# Patient Record
Sex: Male | Born: 1945
Health system: Southern US, Community
[De-identification: ages and names within clinical notes are randomized; demographics above are authoritative.]

## PROBLEM LIST (undated history)

## (undated) DIAGNOSIS — N400 Enlarged prostate without lower urinary tract symptoms: Secondary | ICD-10-CM

## (undated) DIAGNOSIS — U071 COVID-19: Secondary | ICD-10-CM

## (undated) DIAGNOSIS — K219 Gastro-esophageal reflux disease without esophagitis: Secondary | ICD-10-CM

## (undated) DIAGNOSIS — F419 Anxiety disorder, unspecified: Secondary | ICD-10-CM

## (undated) DIAGNOSIS — E039 Hypothyroidism, unspecified: Secondary | ICD-10-CM

## (undated) DIAGNOSIS — Z8711 Personal history of peptic ulcer disease: Secondary | ICD-10-CM

## (undated) DIAGNOSIS — G8190 Hemiplegia, unspecified affecting unspecified side: Secondary | ICD-10-CM

## (undated) DIAGNOSIS — G479 Sleep disorder, unspecified: Secondary | ICD-10-CM

## (undated) DIAGNOSIS — A048 Other specified bacterial intestinal infections: Secondary | ICD-10-CM

## (undated) DIAGNOSIS — R200 Anesthesia of skin: Secondary | ICD-10-CM

## (undated) DIAGNOSIS — E7800 Pure hypercholesterolemia, unspecified: Secondary | ICD-10-CM

## (undated) DIAGNOSIS — C8330 Diffuse large B-cell lymphoma, unspecified site: Secondary | ICD-10-CM

## (undated) DIAGNOSIS — C419 Malignant neoplasm of bone and articular cartilage, unspecified: Secondary | ICD-10-CM

## (undated) DIAGNOSIS — C8510 Unspecified B-cell lymphoma, unspecified site: Secondary | ICD-10-CM

## (undated) DIAGNOSIS — J80 Acute respiratory distress syndrome: Secondary | ICD-10-CM

## (undated) DIAGNOSIS — Z9289 Personal history of other medical treatment: Secondary | ICD-10-CM

## (undated) DIAGNOSIS — C8590 Non-Hodgkin lymphoma, unspecified, unspecified site: Secondary | ICD-10-CM

## (undated) DIAGNOSIS — D492 Neoplasm of unspecified behavior of bone, soft tissue, and skin: Principal | ICD-10-CM

## (undated) DIAGNOSIS — G4700 Insomnia, unspecified: Secondary | ICD-10-CM

## (undated) HISTORY — DX: Hemiplegia, unspecified affecting unspecified side: G81.90

## (undated) HISTORY — DX: Neoplasm of unspecified behavior of bone, soft tissue, and skin: D49.2

## (undated) HISTORY — PX: COLONOSCOPY: SHX174

## (undated) HISTORY — DX: Non-Hodgkin lymphoma, unspecified, unspecified site: C85.90

## (undated) HISTORY — DX: Pure hypercholesterolemia, unspecified: E78.00

## (undated) HISTORY — DX: Benign prostatic hyperplasia without lower urinary tract symptoms: N40.0

## (undated) HISTORY — PX: HERNIA REPAIR: SHX51

## (undated) HISTORY — DX: COVID-19: U07.1

## (undated) HISTORY — DX: Hypothyroidism, unspecified: E03.9

## (undated) HISTORY — DX: Sleep disorder, unspecified: G47.9

## (undated) HISTORY — DX: Insomnia, unspecified: G47.00

## (undated) HISTORY — PX: EYE SURGERY: SHX253

---

## 1989-08-23 DIAGNOSIS — Z8711 Personal history of peptic ulcer disease: Secondary | ICD-10-CM

## 1989-08-23 HISTORY — DX: Personal history of peptic ulcer disease: Z87.11

## 1999-02-28 ENCOUNTER — Inpatient Hospital Stay (HOSPITAL_COMMUNITY): Admission: EM | Admit: 1999-02-28 | Discharge: 1999-03-01 | Payer: Self-pay | Admitting: Emergency Medicine

## 1999-03-02 ENCOUNTER — Inpatient Hospital Stay (HOSPITAL_COMMUNITY): Admission: EM | Admit: 1999-03-02 | Discharge: 1999-03-04 | Payer: Self-pay | Admitting: Emergency Medicine

## 1999-05-07 ENCOUNTER — Ambulatory Visit (HOSPITAL_COMMUNITY): Admission: RE | Admit: 1999-05-07 | Discharge: 1999-05-07 | Payer: Self-pay | Admitting: Gastroenterology

## 2005-01-14 ENCOUNTER — Ambulatory Visit (HOSPITAL_COMMUNITY): Admission: RE | Admit: 2005-01-14 | Discharge: 2005-01-14 | Payer: Self-pay | Admitting: Gastroenterology

## 2013-03-03 ENCOUNTER — Telehealth: Payer: Self-pay | Admitting: Medical Oncology

## 2013-03-03 NOTE — Telephone Encounter (Signed)
error 

## 2015-06-19 ENCOUNTER — Other Ambulatory Visit: Payer: Self-pay | Admitting: Family Medicine

## 2015-06-19 DIAGNOSIS — M5412 Radiculopathy, cervical region: Secondary | ICD-10-CM

## 2015-06-21 ENCOUNTER — Ambulatory Visit
Admission: RE | Admit: 2015-06-21 | Discharge: 2015-06-21 | Disposition: A | Discharge status: Home / Self Care | Payer: PPO | Source: Ambulatory Visit | Attending: Family Medicine | Admitting: Family Medicine

## 2015-06-21 ENCOUNTER — Encounter (HOSPITAL_COMMUNITY): Payer: Self-pay | Admitting: Emergency Medicine

## 2015-06-21 ENCOUNTER — Emergency Department (HOSPITAL_COMMUNITY): Payer: PPO

## 2015-06-21 ENCOUNTER — Emergency Department (HOSPITAL_COMMUNITY)
Admission: EM | Admit: 2015-06-21 | Discharge: 2015-06-21 | Disposition: A | Discharge status: Home / Self Care | Payer: PPO | Attending: Emergency Medicine | Admitting: Emergency Medicine

## 2015-06-21 DIAGNOSIS — R221 Localized swelling, mass and lump, neck: Secondary | ICD-10-CM

## 2015-06-21 DIAGNOSIS — M5412 Radiculopathy, cervical region: Secondary | ICD-10-CM

## 2015-06-21 DIAGNOSIS — Z8719 Personal history of other diseases of the digestive system: Secondary | ICD-10-CM | POA: Diagnosis not present

## 2015-06-21 DIAGNOSIS — D4989 Neoplasm of unspecified behavior of other specified sites: Secondary | ICD-10-CM | POA: Diagnosis not present

## 2015-06-21 DIAGNOSIS — C801 Malignant (primary) neoplasm, unspecified: Secondary | ICD-10-CM

## 2015-06-21 DIAGNOSIS — R202 Paresthesia of skin: Secondary | ICD-10-CM | POA: Insufficient documentation

## 2015-06-21 HISTORY — DX: Personal history of peptic ulcer disease: Z87.11

## 2015-06-21 LAB — CBC
HCT: 43.7 % (ref 39.0–52.0)
Hemoglobin: 14.8 g/dL (ref 13.0–17.0)
MCH: 30 pg (ref 26.0–34.0)
MCHC: 33.9 g/dL (ref 30.0–36.0)
MCV: 88.6 fL (ref 78.0–100.0)
PLATELETS: 230 10*3/uL (ref 150–400)
RBC: 4.93 MIL/uL (ref 4.22–5.81)
RDW: 13.6 % (ref 11.5–15.5)
WBC: 9.5 10*3/uL (ref 4.0–10.5)

## 2015-06-21 LAB — URINALYSIS, ROUTINE W REFLEX MICROSCOPIC
Bilirubin Urine: NEGATIVE
Glucose, UA: 100 mg/dL — AB
Hgb urine dipstick: NEGATIVE
Ketones, ur: NEGATIVE mg/dL
LEUKOCYTES UA: NEGATIVE
NITRITE: NEGATIVE
PROTEIN: NEGATIVE mg/dL
Specific Gravity, Urine: 1.017 (ref 1.005–1.030)
Urobilinogen, UA: 0.2 mg/dL (ref 0.0–1.0)
pH: 7.5 (ref 5.0–8.0)

## 2015-06-21 LAB — BASIC METABOLIC PANEL
Anion gap: 9 (ref 5–15)
BUN: 18 mg/dL (ref 6–20)
CALCIUM: 9.2 mg/dL (ref 8.9–10.3)
CO2: 26 mmol/L (ref 22–32)
CREATININE: 0.99 mg/dL (ref 0.61–1.24)
Chloride: 104 mmol/L (ref 101–111)
Glucose, Bld: 156 mg/dL — ABNORMAL HIGH (ref 65–99)
Potassium: 4.1 mmol/L (ref 3.5–5.1)
Sodium: 139 mmol/L (ref 135–145)

## 2015-06-21 MED ORDER — IOHEXOL 300 MG/ML  SOLN
100.0000 mL | Freq: Once | INTRAMUSCULAR | Status: AC | PRN
  Administered 2015-06-21: 100 mL via INTRAVENOUS

## 2015-06-21 MED ORDER — DEXAMETHASONE SODIUM PHOSPHATE 10 MG/ML IJ SOLN
4.0000 mg | Freq: Once | INTRAMUSCULAR | Status: AC
  Administered 2015-06-21: 4 mg via INTRAVENOUS
  Filled 2015-06-21: qty 1

## 2015-06-21 MED ORDER — DEXAMETHASONE 4 MG PO TABS: 4.0000 mg | ORAL_TABLET | Freq: Three times a day (TID) | ORAL | Status: DC

## 2015-06-21 MED ORDER — IOHEXOL 300 MG/ML  SOLN
25.0000 mL | Freq: Once | INTRAMUSCULAR | Status: AC | PRN
  Administered 2015-06-21: 25 mL via ORAL

## 2015-06-21 NOTE — ED Provider Notes (Signed)
CSN: [id number]     Arrival date & time 06/21/15  1640 History   First MD Initiated Contact with Patient 06/21/15 1643     No chief complaint on file.    (Consider location/radiation/quality/duration/timing/severity/associated sxs/prior Treatment) HPI Comments: Dr. Harrington Challenger sent patient to ED after MRI shows C5 tumor. Dr. Harrington Challenger spoke with Dr. Ronnald Ramp with Neurosurgery, who recommended a hard collar and further workup for cancer.  Patient had MRI ordered for bilateral shoulder tingling in cape-like distribution. Patient is a 69 y.o. male presenting with neurologic complaint. The history is provided by the patient.  Neurologic Problem This is a new problem. The current episode started more than 2 days ago. The problem occurs daily. The problem has not changed since onset.Pertinent negatives include no abdominal pain and no shortness of breath. Exacerbated by: with lying down. Nothing relieves the symptoms. He has tried nothing for the symptoms.    Past Medical History  Diagnosis Date  . History of bleeding ulcers    Past Surgical History  Procedure Laterality Date  . Hernia repair     No family history on file. History  Substance Use Topics  . Smoking status: Not on file  . Smokeless tobacco: Not on file  . Alcohol Use: Not on file    Review of Systems  Constitutional: Negative for fever.  Respiratory: Negative for cough and shortness of breath.   Gastrointestinal: Negative for vomiting and abdominal pain.  All other systems reviewed and are negative.     Allergies  Review of patient's allergies indicates not on file.  Home Medications   Prior to Admission medications   Not on File   BP 161/81 mmHg  Pulse 97  Temp(Src) 98.1 F (36.7 C) (Oral)  Resp 20  SpO2 99% Physical Exam  Constitutional: He is oriented to person, place, and time. He appears well-developed and well-nourished. No distress.  HENT:  Head: Normocephalic and atraumatic.  Mouth/Throat: Oropharynx is  clear and moist. No oropharyngeal exudate.  Eyes: EOM are normal. Pupils are equal, round, and reactive to light.  Neck: Normal range of motion. Neck supple.  Cardiovascular: Normal rate and regular rhythm.  Exam reveals no friction rub.   No murmur heard. Pulmonary/Chest: Effort normal and breath sounds normal. No respiratory distress. He has no wheezes. He has no rales.  Abdominal: Soft. He exhibits no distension. There is no tenderness. There is no rebound.  Musculoskeletal: Normal range of motion. He exhibits no edema.  Neurological: He is alert and oriented to person, place, and time. A sensory deficit (mild tingling on bilateral shoulders) is present. No cranial nerve deficit. GCS eye subscore is 4. GCS verbal subscore is 5. GCS motor subscore is 6.  Skin: No rash noted. He is not diaphoretic.  Nursing note and vitals reviewed.   ED Course  Procedures (including critical care time) Labs Review Labs Reviewed  PROTEIN ELECTROPHORESIS, SERUM  CBC  BASIC METABOLIC PANEL  URINALYSIS, ROUTINE W REFLEX MICROSCOPIC (NOT AT [facility])    Imaging Review Mr Cervical Spine Wo Contrast  06/21/2015   CLINICAL DATA:  Cervical radiculitis. Neck pain and left arm pain with bilateral shoulder pain for 3 weeks. Left arm numbness and slight weakness.  EXAM: MRI CERVICAL SPINE WITHOUT CONTRAST  TECHNIQUE: Multiplanar, multisequence MR imaging of the cervical spine was performed. No intravenous contrast was administered.  COMPARISON:  None.  FINDINGS: Images are mildly to moderately degraded by motion artifact. There is mild right convex cervical torticollis. There is no significant  listhesis. Vertebral body heights are preserved.  There is moderate disc space narrowing at C5-6. There is diffusely abnormal marrow throughout the C5 vertebral body, T1 hypointense and STIR hyperintense. Abnormal marrow signal is present in the C5 pedicles with particularly STIR hyperintense signal in the right C5 pars.  There is  abnormal intermediate to low T1 and intermediate to high T2 signal ventral epidural material posterior to the C5 vertebral body which measures up to 7 mm in AP thickness and results in moderate spinal stenosis with moderate impression on the spinal cord. There is mild T2 hyperintensity in the spinal cord at this level compatible with mild cord edema. Abnormal paravertebral soft tissue is also present lateral to the C5 vertebral body and severely narrows the C5-6 neural foramina bilaterally. Tumor also extends into and narrows the right C4-5 neural foramen. Ventral epidural material extends caudally to the inferior C6 endplate level.  Mild STIR hyperintensity along the C6 superior endplate is favored to be related to degenerative disc disease rather than tumor. At C6-7, there is mild disc space height loss, mild disc bulging, and right greater than left uncovertebral spurring which result in moderate to severe right and mild left neural foraminal stenosis without significant spinal stenosis. Severe, asymmetric left-sided facet arthrosis results in moderate left neural foraminal stenosis at C7-T1.  IMPRESSION: 1. Diffuse marrow replacement of the C5 vertebral body highly concerning for neoplasm (metastatic disease, myeloma, or lymphoma). Epidural tumor at this level results in moderate spinal stenosis with moderate impression on the spinal cord and mild cord edema. 2. Abnormal marrow signal extending into the right greater than left C5 posterior elements may reflect additional osseous tumor, with superimposed pathologic fracture not excluded given the edema in the right pars. This could be further evaluated with cervical spine CT.  These results were called by telephone at the time of interpretation on 06/21/2015 at 2:19 pm to Dr. Lona Kettle , who verbally acknowledged these results.   Electronically Signed   By: Logan Bores   On: 06/21/2015 14:28     EKG Interpretation None      MDM   Final diagnoses:   Neck mass    69 year old male here with a C5 tumor. MRI ordered by his PCP for some tingling in his bilateral shoulders and weakness in his bilateral arms. Resulted today with C5 tumor, mild cord edema. Dr. Harrington Challenger chatted with Dr. Ronnald Ramp of neurosurgery who recommended further workup in an [place] collar. Patient here doing well, good grip strength bilaterally but does state some tingling in his shoulders. He stated when he shaves CSN: His one arm up with the other. Denies any other neurologic symptoms. He's well appearing otherwise. We'll order labs and speak with Dr. Ronnald Ramp.  Dr. Ronnald Ramp recommended further workup and Decadron 4 mg q8h.  Patient discussed with Dr. Wynelle Cleveland, she feels patient can be worked up as an outpatient and doesn't require admission. She spoke with Dr. Ronnald Ramp and he agreed. Patient's CTs negative for large cancer. Small pulmonary nodule in the right lower lobe. Patient stable for discharge. I spoke with Dr. Addison Lank with [place] who can help ensure patient gets f/u.   Evelina Bucy, MD 06/22/15 0000

## 2015-06-21 NOTE — ED Notes (Signed)
Per pt states he has been having neck and shoulder pain-went for MRI and was told he had a mass on spinal cord

## 2015-06-21 NOTE — ED Notes (Signed)
Bed: WA17 Expected date:  Expected time:  Means of arrival:  Comments: Pt from PCP- [name]

## 2015-06-21 NOTE — Discharge Instructions (Signed)
Workup today was negative for any acute findings other than the mass in your neck that she already know about. Please take sterilely 3 times a day. Please follow-up with Dr. Harrington Challenger in 1-2 days.

## 2015-06-22 LAB — PROTEIN ELECTROPHORESIS, SERUM
A/G RATIO SPE: 1.1 (ref 0.7–1.7)
Albumin ELP: 3.6 g/dL (ref 2.9–4.4)
Alpha-1-Globulin: 0.2 g/dL (ref 0.0–0.4)
Alpha-2-Globulin: 0.7 g/dL (ref 0.4–1.0)
Beta Globulin: 1.1 g/dL (ref 0.7–1.3)
GAMMA GLOBULIN: 1.2 g/dL (ref 0.4–1.8)
Globulin, Total: 3.3 g/dL (ref 2.2–3.9)
Total Protein ELP: 6.9 g/dL (ref 6.0–8.5)

## 2015-06-28 ENCOUNTER — Telehealth: Payer: Self-pay | Admitting: Oncology

## 2015-06-29 NOTE — Telephone Encounter (Signed)
Error

## 2015-06-30 ENCOUNTER — Other Ambulatory Visit: Payer: Self-pay | Admitting: Internal Medicine

## 2015-06-30 ENCOUNTER — Ambulatory Visit: Payer: PPO | Admitting: Internal Medicine

## 2015-06-30 ENCOUNTER — Other Ambulatory Visit: Payer: PPO

## 2015-06-30 ENCOUNTER — Ambulatory Visit: Payer: PPO

## 2015-06-30 DIAGNOSIS — M899 Disorder of bone, unspecified: Secondary | ICD-10-CM | POA: Insufficient documentation

## 2015-07-03 ENCOUNTER — Telehealth: Payer: Self-pay | Admitting: Hematology

## 2015-07-03 ENCOUNTER — Encounter: Payer: Self-pay | Admitting: Hematology

## 2015-07-03 ENCOUNTER — Ambulatory Visit (HOSPITAL_BASED_OUTPATIENT_CLINIC_OR_DEPARTMENT_OTHER): Payer: PPO

## 2015-07-03 ENCOUNTER — Ambulatory Visit (HOSPITAL_BASED_OUTPATIENT_CLINIC_OR_DEPARTMENT_OTHER): Payer: PPO | Admitting: Hematology

## 2015-07-03 VITALS — BP 145/75 | HR 59 | Temp 98.4°F | Resp 17 | Ht 73.0 in | Wt 222.7 lb

## 2015-07-03 DIAGNOSIS — M542 Cervicalgia: Secondary | ICD-10-CM

## 2015-07-03 DIAGNOSIS — D492 Neoplasm of unspecified behavior of bone, soft tissue, and skin: Secondary | ICD-10-CM | POA: Diagnosis not present

## 2015-07-03 HISTORY — DX: Neoplasm of unspecified behavior of bone, soft tissue, and skin: D49.2

## 2015-07-03 NOTE — Patient Instructions (Signed)
-   Reported emergency room immediately if you have any worsening neck pains or new neurological symptoms. Plan for working up your spine tumor -Get labs today -Set up a PET/CT scan to check for a primary source of your tumor Set up an urgent referrals with radiation oncology, neurosurgery and interventional radiology you'll return to clinic to see me in one week

## 2015-07-03 NOTE — Telephone Encounter (Signed)
per pof to sch pt appt-gave pt copy of avs °

## 2015-07-03 NOTE — Progress Notes (Signed)
Checked in new pt with no financial concerns prior to seeing the dr.  Pt has my card for any billing questions, concerns or if financial assistance is needed.  ° °

## 2015-07-04 ENCOUNTER — Encounter: Payer: Self-pay | Admitting: Radiation Oncology

## 2015-07-04 ENCOUNTER — Other Ambulatory Visit (HOSPITAL_BASED_OUTPATIENT_CLINIC_OR_DEPARTMENT_OTHER): Payer: PPO

## 2015-07-04 ENCOUNTER — Other Ambulatory Visit (HOSPITAL_COMMUNITY)
Admission: RE | Admit: 2015-07-04 | Discharge: 2015-07-04 | Disposition: A | Discharge status: Home / Self Care | Payer: PPO | Source: Ambulatory Visit | Attending: Hematology | Admitting: Hematology

## 2015-07-04 ENCOUNTER — Encounter: Payer: Self-pay | Admitting: Hematology

## 2015-07-04 DIAGNOSIS — D492 Neoplasm of unspecified behavior of bone, soft tissue, and skin: Secondary | ICD-10-CM

## 2015-07-04 DIAGNOSIS — M899 Disorder of bone, unspecified: Secondary | ICD-10-CM | POA: Insufficient documentation

## 2015-07-04 DIAGNOSIS — M542 Cervicalgia: Secondary | ICD-10-CM | POA: Insufficient documentation

## 2015-07-04 DIAGNOSIS — C7951 Secondary malignant neoplasm of bone: Secondary | ICD-10-CM

## 2015-07-04 LAB — CBC WITH DIFFERENTIAL/PLATELET
BASO%: 0.2 % (ref 0.0–2.0)
BASOS ABS: 0 10*3/uL (ref 0.0–0.1)
EOS%: 0 % (ref 0.0–7.0)
Eosinophils Absolute: 0 10*3/uL (ref 0.0–0.5)
HEMATOCRIT: 46.5 % (ref 38.4–49.9)
HEMOGLOBIN: 15.4 g/dL (ref 13.0–17.1)
LYMPH#: 2 10*3/uL (ref 0.9–3.3)
LYMPH%: 13.3 % — AB (ref 14.0–49.0)
MCH: 29.6 pg (ref 27.2–33.4)
MCHC: 33.1 g/dL (ref 32.0–36.0)
MCV: 89.4 fL (ref 79.3–98.0)
MONO#: 0.9 10*3/uL (ref 0.1–0.9)
MONO%: 5.5 % (ref 0.0–14.0)
NEUT#: 12.4 10*3/uL — ABNORMAL HIGH (ref 1.5–6.5)
NEUT%: 81 % — AB (ref 39.0–75.0)
PLATELETS: 201 10*3/uL (ref 140–400)
RBC: 5.2 10*6/uL (ref 4.20–5.82)
RDW: 14.5 % (ref 11.0–14.6)
WBC: 15.3 10*3/uL — AB (ref 4.0–10.3)

## 2015-07-04 LAB — COMPREHENSIVE METABOLIC PANEL (CC13)
ALBUMIN: 3.1 g/dL — AB (ref 3.5–5.0)
ALK PHOS: 62 U/L (ref 40–150)
ALT: 29 U/L (ref 0–55)
ANION GAP: 6 meq/L (ref 3–11)
AST: 18 U/L (ref 5–34)
BUN: 24 mg/dL (ref 7.0–26.0)
CHLORIDE: 107 meq/L (ref 98–109)
CO2: 25 mEq/L (ref 22–29)
Calcium: 9 mg/dL (ref 8.4–10.4)
Creatinine: 0.8 mg/dL (ref 0.7–1.3)
EGFR: >90 mL/min/{1.73_m2} (ref >90)
GLUCOSE: 96 mg/dL (ref 70–140)
Potassium: 4.6 mEq/L (ref 3.5–5.1)
SODIUM: 137 meq/L (ref 136–145)
Total Bilirubin: 0.55 mg/dL (ref 0.20–1.20)
Total Protein: 6.1 g/dL — ABNORMAL LOW (ref 6.4–8.3)

## 2015-07-04 LAB — PROTIME-INR
INR: 0.98 (ref 0.00–1.49)
PROTHROMBIN TIME: 13.2 s (ref 11.6–15.2)

## 2015-07-04 LAB — APTT: APTT: 23 s — AB (ref 24–37)

## 2015-07-04 LAB — C-REACTIVE PROTEIN: CRP: <0.5 mg/dL (ref <0.60)

## 2015-07-04 LAB — LACTATE DEHYDROGENASE (CC13): LDH: 232 U/L (ref 125–245)

## 2015-07-04 LAB — SEDIMENTATION RATE: Sed Rate: 1 mm/hr (ref 0–20)

## 2015-07-04 NOTE — Addendum Note (Signed)
Addended by: Sullivan Lone on: 07/04/2015 10:39 AM   Modules accepted: Miquel Dunn

## 2015-07-04 NOTE — Assessment & Plan Note (Signed)
Patient presented with neck pain for 4-6 weeks with evidence of some proximal muscle weakness and bilateral shoulder pains and left upper extremity discomfort. No gait instability. No bowel or bladder dysfunction. MRI of the C-spine showed CD5 bone marrow replacement concerning for neoplasm with epidural complement and mild cord compression.  The differential diagnosis at this point is broad including metastatic malignancy/lymphoma/multiple myeloma/primary bone tumor. CT chest abdomen pelvis was unrevealing off the site of primary malignancy. Incidentally noted 5 mm right lower lobe lung nodule.  Patient notes improvement in symptoms with using a hard cervical collar and being started on dexamethasone.  Plan -CT scan does not demonstrate an obvious primary lesion or an alternative lesion that might be easier to biopsy for tissue diagnosis. -We will send out labs for a myeloma workup, LDH - sedimentation rate CRP and procalcitonin -PET/CT urgently determine extent of involvement, such for primary tumor. Potentially may reveal an easy to biopsy site pathology. -Initiated official neurosurgery consultation urgently to determined need for surgery if needed for spine stabilization cord compression and might also help with diagnosis of the etiology of the C-spine lesion. -will place interventional radiology consult for diagnostic biopsy of c5 lesion (or other accesible lesion if any noted on PET/CT) if neurosurgery is not planning to perform surgery. -We shall also urgently consult radiation oncology to make them aware of this patient since RT might likely be the treatment if surgery is not indicated. -Continue dexamethasone for now (if patient has Myeloma or lymphoma this unfortunately might muddy the diagnosis) -Patient was strongly counseled to seek immediate attention in the emergency room for worsening neck pain or neurological symptoms.  -. There appears to be significant delay in having the plan  executed as outpatient and given the fact that the patient needs urgent multi-specialty care coordination might admit him expedite the workup and treatment .

## 2015-07-04 NOTE — Progress Notes (Addendum)
Histology and Location of Primary Cancer: C-5 lesion  Sites of Visceral and Bony Metastatic Disease: C-5 mild cord compression  Location(s) of Symptomatic Metastases: pain,tingling numbness on  B/L shoulders and left upper extremity   Past/Anticipated chemotherapy by medical oncology, if any: Dr. Sullivan Lone ,MD 06/21/15,   cervical collar, , will send out labs for myeloma work up , Pet/CT, Neuro consult,  with  f/u 07/10/15 ; Pain on a scale of 0-10 is:  Right shoulder tingling  rx tramadol, prn   If Spine Met(s), symptoms, if any, include:  Bowel/Bladder retention or incontinence (please describe): normal  Numbness or weakness in extremities (please describe) Left arm numbness and weakness:most gone now, minor since taking decadron  Current Decadron regimen, if applicable: 4 mg tid  Ambulatory status? Walker? Wheelchair?:ambulatory wears  Hard collar on for 2 weekst   SAFETY ISSUES: No  Prior radiation? NO  Pacemaker/ICD? NO  Possible current pregnancy?  NO  Is the patient on methotrexate? MO Current Complaints / other details:  Married,smoker  1ppd for 10 years, quit 12/23/81,no alcohol , hx bleeding ulcers, hernia repair ,Insomnia, actinic keratosis anxiety,  Allergies: NKA  .BP 150/78 mmHg  Pulse 62  Temp(Src) 98 F (36.7 C) (Oral)  Resp 20  Ht _0  (1.854 m)  Wt 220 lb (99.791 kg)  BMI 29.03 kg/m2  SpO2 99%  Wt Readings from Last 3 Encounters:  07/05/15 220 lb (99.791 kg)  07/03/15 222 lb 11.2 oz (101.016 kg)  06/21/15 225 lb (102.059 kg)

## 2015-07-04 NOTE — Progress Notes (Signed)
[name]    CONSULT NOTE  Patient Care Team: Lona Kettle, MD as PCP - General (Family Medicine) Brunetta Genera, MD as Consulting Physician (Hematology)  CHIEF COMPLAINTS/PURPOSE OF CONSULTATION:  Neck pain/newly noted C5 spine lesion with epidural extension and mild cord compression.   HISTORY OF PRESENTING ILLNESS:  Ethan Stewart 69 y.o. male of caucasian ancestry is very pleasant and has been in fairly good overall health with minimal chronic comorbidities. Patient presents with 4-6 weeks of worsening neck pain and tingling and numbness of the shoulders and left upper extremity. He first noticed the symptoms about 6 weeks ago at which point he was concerned about whether he had Lyme disease because he had a deer tick bite and will his other friends had take bites and acquired Lyme disease. Patient notes that he went to his primary care physician was tested for Lyme disease and noted negative titers. He had no localized target lesion or rashes at the site of the tick bite. Patient subsequently presented to his primary care physician again on 06/12/2015 with worsening back pain and had an MRI of the C-spine ordered. Which was subsequently done on 06/21/2015 and showed marrow replacement of the C5 vertebral body highly concerning for a neoplasm. Epidural tumor was also noted at this level resulting in moderate spinal stenosis with moderate impression on the spinal cord and mild cord edema. Superimposed pathologic fracture was not excluded.  Patient's primary care physician referred him to the emergency room where he was seen on 06/21/2015. The patient was evaluated by the ED physician consulted Dr. Ronnald Ramp from neurosurgery and the admitting hospitalist. The patient was started on dexamethasone 4 milligrams 3 times a day and given tramadol when necessary for pain management. It was determined that he did not need an inpatient workup or management. Patient reports that he was not given a neurosurgery  followup was recommended to continue using a hard cervical collar which she continues to do..  Patient notes that his neck pain has improved with using the heart cervical collar and taking his dexamethasone. He notes improvement in his bilateral upper discomfort and tingling. He notes that his left upper extremity only has some tingling over his arm. He notes some proximal muscle weakness in bilateral shoulders noted when he tries to reach for things above his shoulders.  He is appreciative of being seen in clinic today to try to figure out what is happening. He is appropriately anxious that delay in his diagnosis and treatment might need to him getting paralyzed which he describes as being 'worse than death" for himself.   Patient denies any fevers/chills/weight loss/night sweats.   no other obvious areas of back pain or bone pain. No overt weight loss. Has been feeling very hungry since being on steroids.   MEDICAL HISTORY:  Past Medical History  Diagnosis Date  . History of bleeding ulcers   . Cervical spine tumor 07/03/2015    SURGICAL HISTORY: Past Surgical History  Procedure Laterality Date  . Hernia repair      SOCIAL HISTORY: History   Social History  . Marital Status: Married    Spouse Name: N/A  . Number of Children: N/A  . Years of Education: N/A   Occupational History  . Not on file.   Social History Main Topics  . Smoking status: Never Smoker   . Smokeless tobacco: Not on file  . Alcohol Use: No  . Drug Use: Not on file  . Sexual Activity: Not on file  Other Topics Concern  . Not on file   Social History Narrative    FAMILY HISTORY: No family history on file.  ALLERGIES:  has No Known Allergies.  MEDICATIONS:  Current Outpatient Prescriptions  Medication Sig Dispense Refill  . dexamethasone (DECADRON) 4 MG tablet Take 1 tablet (4 mg total) by mouth 3 (three) times daily. 90 tablet 0  . diazepam (VALIUM) 5 MG tablet Take 5 mg by mouth every 6  (six) hours as needed for anxiety (anxiety).    . simvastatin (ZOCOR) 20 MG tablet Take 20 mg by mouth daily.    . temazepam (RESTORIL) 15 MG capsule Take 15 mg by mouth at bedtime as needed for sleep (sleep).    . traMADol (ULTRAM) 50 MG tablet Take 50-100 mg by mouth every 6 (six) hours as needed for moderate pain or severe pain (pain).     No current facility-administered medications for this visit.    REVIEW OF SYSTEMS:   Constitutional: Denies fevers, chills or abnormal night sweats Eyes: Denies blurriness of vision, double vision or watery eyes Ears, nose, mouth, throat, and face: Denies mucositis or sore throat Respiratory: Denies cough, dyspnea or wheezes Cardiovascular: Denies palpitation, chest discomfort or lower extremity swelling Gastrointestinal:  Denies nausea, heartburn or change in bowel habits Skin: Denies abnormal skin rashes Lymphatics: Denies new lymphadenopathy or easy bruising Neurological bilateral shoulder tingling and some discomfort, proximal shoulder muscle weakness on arm raising. No evidence of bowel or bladder dysfunction. Behavioral/Psych: Mood is stable, no new changes  All other systems were reviewed with the patient and are negative.  PHYSICAL EXAMINATION: ECOG PERFORMANCE STATUS: 1 - Symptomatic but completely ambulatory  Filed Vitals:   07/03/15 1456  BP: 145/75  Pulse: 59  Temp: 98.4 F (36.9 C)  Resp: 17   Filed Weights   07/03/15 1456  Weight: 222 lb 11.2 oz (101.016 kg)    GENERAL:alert, no distress and comfortable SKIN: skin color, texture, turgor are normal, no rashes or significant lesions EYES: normal, conjunctiva are pink and non-injected, sclera clear OROPHARYNX:no exudate, no erythema and lips, buccal mucosa, and tongue normal  NECK: supple, thyroid normal size, non-tender, without nodularity LYMPH:  no palpable lymphadenopathy in the cervical, axillary or inguinal LUNGS: clear to auscultation and percussion with normal  breathing effort HEART: regular rate & rhythm and no murmurs and no lower extremity edema ABDOMEN:abdomen soft, non-tender and normal bowel sounds Musculoskeletal:no cyanosis of digits and no clubbing  PSYCH: alert & oriented x 3 with fluent speech NEURO: no focal motor/sensory deficits  LABORATORY DATA:  I have reviewed the data as listed Lab Results  Component Value Date   WBC 9.5 06/21/2015   HGB 14.8 06/21/2015   HCT 43.7 06/21/2015   MCV 88.6 06/21/2015   PLT 230 06/21/2015    Recent Labs  06/21/15 1709  NA 139  K 4.1  CL 104  CO2 26  GLUCOSE 156*  BUN 18  CREATININE 0.99  CALCIUM 9.2  GFRNONAA >60  GFRAA >60    RADIOGRAPHIC STUDIES: I have personally reviewed the radiological images as listed and agreed with the findings in the report. Ct Chest W Contrast  06/21/2015   CLINICAL DATA:  Neck and shoulder pain for 3 weeks, known history of vertebral involvement with epidural mass lesion, check for primary  EXAM: CT CHEST, ABDOMEN, AND PELVIS WITH CONTRAST  TECHNIQUE: Multidetector CT imaging of the chest, abdomen and pelvis was performed following the standard protocol during bolus administration of intravenous contrast.  CONTRAST:  64m OMNIPAQUE IOHEXOL 300 MG/ML SOLN, 1087mOMNIPAQUE IOHEXOL 300 MG/ML SOLN  COMPARISON:  None.  FINDINGS: CT CHEST FINDINGS  The lungs are well aerated bilaterally. No focal infiltrate or sizable effusion is noted. A tiny 5 mm nodule is noted within the right lower lobe best seen on image number 35 of series 7. No other nodules are seen.  The thoracic inlet is within normal limits. The thoracic aorta in its branches are unremarkable. The pulmonary artery as visualized is within normal limits. No hilar or mediastinal adenopathy is seen.  CT ABDOMEN AND PELVIS FINDINGS  There is air within the biliary tree likely related to prior sphincterotomy. The liver and gallbladder and spleen are otherwise within normal limits. The adrenal glands and pancreas  show no focal abnormality. Parapelvic cysts are noted bilaterally within the kidneys. No renal calculi or obstructive changes are noted.  The appendix is within normal limits. Diffuse aortoiliac calcifications are noted. A retro aortic left renal vein is seen.  Bladder is well distended. The prostate and seminal vesicles are unremarkable. No acute bony abnormality is noted.  IMPRESSION: 5 mm nodule within the right lower lobe as described.  Pneumobilia consistent with a prior sphincterotomy.  No acute abnormality is identified. No findings to suggest chest etiology for the known lesion at C5 are seen.   Electronically Signed   By: MaInez Catalina.D.   On: 06/21/2015 20:37   Mr Cervical Spine Wo Contrast  06/21/2015   CLINICAL DATA:  Cervical radiculitis. Neck pain and left arm pain with bilateral shoulder pain for 3 weeks. Left arm numbness and slight weakness.  EXAM: MRI CERVICAL SPINE WITHOUT CONTRAST  TECHNIQUE: Multiplanar, multisequence MR imaging of the cervical spine was performed. No intravenous contrast was administered.  COMPARISON:  None.  FINDINGS: Images are mildly to moderately degraded by motion artifact. There is mild right convex cervical torticollis. There is no significant listhesis. Vertebral body heights are preserved.  There is moderate disc space narrowing at C5-6. There is diffusely abnormal marrow throughout the C5 vertebral body, T1 hypointense and STIR hyperintense. Abnormal marrow signal is present in the C5 pedicles with particularly STIR hyperintense signal in the right C5 pars.  There is abnormal intermediate to low T1 and intermediate to high T2 signal ventral epidural material posterior to the C5 vertebral body which measures up to 7 mm in AP thickness and results in moderate spinal stenosis with moderate impression on the spinal cord. There is mild T2 hyperintensity in the spinal cord at this level compatible with mild cord edema. Abnormal paravertebral soft tissue is also present  lateral to the C5 vertebral body and severely narrows the C5-6 neural foramina bilaterally. Tumor also extends into and narrows the right C4-5 neural foramen. Ventral epidural material extends caudally to the inferior C6 endplate level.  Mild STIR hyperintensity along the C6 superior endplate is favored to be related to degenerative disc disease rather than tumor. At C6-7, there is mild disc space height loss, mild disc bulging, and right greater than left uncovertebral spurring which result in moderate to severe right and mild left neural foraminal stenosis without significant spinal stenosis. Severe, asymmetric left-sided facet arthrosis results in moderate left neural foraminal stenosis at C7-T1.  IMPRESSION: 1. Diffuse marrow replacement of the C5 vertebral body highly concerning for neoplasm (metastatic disease, myeloma, or lymphoma). Epidural tumor at this level results in moderate spinal stenosis with moderate impression on the spinal cord and mild cord edema. 2. Abnormal marrow  signal extending into the right greater than left C5 posterior elements may reflect additional osseous tumor, with superimposed pathologic fracture not excluded given the edema in the right pars. This could be further evaluated with cervical spine CT.  These results were called by telephone at the time of interpretation on 06/21/2015 at 2:19 pm to Dr. Lona Kettle , who verbally acknowledged these results.   Electronically Signed   By: Logan Bores   On: 06/21/2015 14:28   Ct Abdomen Pelvis W Contrast  06/21/2015   CLINICAL DATA:  Neck and shoulder pain for 3 weeks, known history of vertebral involvement with epidural mass lesion, check for primary  EXAM: CT CHEST, ABDOMEN, AND PELVIS WITH CONTRAST  TECHNIQUE: Multidetector CT imaging of the chest, abdomen and pelvis was performed following the standard protocol during bolus administration of intravenous contrast.  CONTRAST:  81m OMNIPAQUE IOHEXOL 300 MG/ML SOLN, 1035mOMNIPAQUE  IOHEXOL 300 MG/ML SOLN  COMPARISON:  None.  FINDINGS: CT CHEST FINDINGS  The lungs are well aerated bilaterally. No focal infiltrate or sizable effusion is noted. A tiny 5 mm nodule is noted within the right lower lobe best seen on image number 35 of series 7. No other nodules are seen.  The thoracic inlet is within normal limits. The thoracic aorta in its branches are unremarkable. The pulmonary artery as visualized is within normal limits. No hilar or mediastinal adenopathy is seen.  CT ABDOMEN AND PELVIS FINDINGS  There is air within the biliary tree likely related to prior sphincterotomy. The liver and gallbladder and spleen are otherwise within normal limits. The adrenal glands and pancreas show no focal abnormality. Parapelvic cysts are noted bilaterally within the kidneys. No renal calculi or obstructive changes are noted.  The appendix is within normal limits. Diffuse aortoiliac calcifications are noted. A retro aortic left renal vein is seen.  Bladder is well distended. The prostate and seminal vesicles are unremarkable. No acute bony abnormality is noted.  IMPRESSION: 5 mm nodule within the right lower lobe as described.  Pneumobilia consistent with a prior sphincterotomy.  No acute abnormality is identified. No findings to suggest chest etiology for the known lesion at C5 are seen.   Electronically Signed   By: MaInez Catalina.D.   On: 06/21/2015 20:37    ASSESSMENT & PLAN:   .Cervical spine tumor Patient presented with neck pain for 4-6 weeks with evidence of some proximal muscle weakness and bilateral shoulder pains and left upper extremity discomfort. No gait instability. No bowel or bladder dysfunction. MRI of the C-spine showed CD5 bone marrow replacement concerning for neoplasm with epidural complement and mild cord compression.  The differential diagnosis at this point is broad including metastatic malignancy/lymphoma/multiple myeloma/primary bone tumor. CT chest abdomen pelvis was  unrevealing off the site of primary malignancy. Incidentally noted 5 mm right lower lobe lung nodule.  Patient notes improvement in symptoms with using a hard cervical collar and being started on dexamethasone.  Plan -CT scan does not demonstrate an obvious primary lesion or an alternative lesion that might be easier to biopsy for tissue diagnosis. -We will send out labs for a myeloma workup, LDH - sedimentation rate CRP and procalcitonin -PET/CT urgently determine extent of involvement, such for primary tumor. Potentially may reveal an easy to biopsy site pathology. -Initiated official neurosurgery consultation urgently to determined need for surgery if needed for spine stabilization cord compression and might also help with diagnosis of the etiology of the C-spine lesion. -will place interventional radiology  consult for diagnostic biopsy of c5 lesion (or other accesible lesion if any noted on PET/CT) if neurosurgery is not planning to perform surgery. -We shall also urgently consult radiation oncology to make them aware of this patient since RT might likely be the treatment if surgery is not indicated. -Continue dexamethasone for now (if patient has Myeloma or lymphoma this unfortunately might muddy the diagnosis) -Patient was strongly counseled to seek immediate attention in the emergency room for worsening neck pain or neurological symptoms.  -. There appears to be significant delay in having the plan executed as outpatient and given the fact that the patient needs urgent multi-specialty care coordination might admit him expedite the workup and treatment .    Neck pain, acute This is related to his C5 lesion as well as degenerative disc disease and facet arthropathy. Plan -Patient is currently using tramadol when necessary with adequate relief. -Continue using cervical heart collar continuously as recommended by neurosurgery.    All questions were answered. The patient knows to call the  clinic with any problems, questions or concerns. I spent 60 minutes counseling the patient face to face. The total time spent in the appointment was 80 minutes and more than 50% was on counseling.     Brunetta Genera, MD [phone]

## 2015-07-04 NOTE — Assessment & Plan Note (Signed)
This is related to his C5 lesion as well as degenerative disc disease and facet arthropathy. Plan -Patient is currently using tramadol when necessary with adequate relief. -Continue using cervical heart collar continuously as recommended by neurosurgery.

## 2015-07-05 ENCOUNTER — Encounter: Payer: Self-pay | Admitting: Radiation Oncology

## 2015-07-05 ENCOUNTER — Encounter: Payer: Self-pay | Admitting: Hematology

## 2015-07-05 ENCOUNTER — Ambulatory Visit
Admission: RE | Admit: 2015-07-05 | Discharge: 2015-07-05 | Disposition: A | Discharge status: Home / Self Care | Payer: PPO | Source: Ambulatory Visit | Attending: Radiation Oncology | Admitting: Radiation Oncology

## 2015-07-05 VITALS — BP 150/78 | HR 62 | Temp 98.0°F | Resp 20 | Ht 73.0 in | Wt 220.0 lb

## 2015-07-05 DIAGNOSIS — Z510 Encounter for antineoplastic radiation therapy: Secondary | ICD-10-CM | POA: Insufficient documentation

## 2015-07-05 DIAGNOSIS — C412 Malignant neoplasm of vertebral column: Secondary | ICD-10-CM | POA: Diagnosis present

## 2015-07-05 DIAGNOSIS — M899 Disorder of bone, unspecified: Secondary | ICD-10-CM

## 2015-07-05 DIAGNOSIS — D492 Neoplasm of unspecified behavior of bone, soft tissue, and skin: Secondary | ICD-10-CM

## 2015-07-05 DIAGNOSIS — F419 Anxiety disorder, unspecified: Secondary | ICD-10-CM | POA: Insufficient documentation

## 2015-07-05 DIAGNOSIS — C7951 Secondary malignant neoplasm of bone: Secondary | ICD-10-CM

## 2015-07-05 HISTORY — DX: Anxiety disorder, unspecified: F41.9

## 2015-07-05 HISTORY — DX: Malignant neoplasm of bone and articular cartilage, unspecified: C41.9

## 2015-07-05 LAB — PSA: PSA: 1.1 ng/mL (ref <=4.00)

## 2015-07-05 LAB — IGG, IGA, IGM
IgA: 213 mg/dL (ref 68–379)
IgG (Immunoglobin G), Serum: 878 mg/dL (ref 650–1600)
IgM, Serum: 110 mg/dL (ref 41–251)

## 2015-07-05 LAB — CALCIUM, IONIZED: Calcium, Ion: 1.28 mmol/L (ref 1.12–1.32)

## 2015-07-05 LAB — KAPPA/LAMBDA LIGHT CHAINS
Kappa free light chain: 1.06 mg/dL (ref 0.33–1.94)
Kappa:Lambda Ratio: 1.23 (ref 0.26–1.65)
Lambda Free Lght Chn: 0.86 mg/dL (ref 0.57–2.63)

## 2015-07-05 NOTE — Progress Notes (Signed)
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 A diagnosis of Bone lesion was also pertinent to this visit.   HISTORY OF PRESENT ILLNESS::Ethan Stewart is a 69 y.o. male who is seen for an initial consultation visit regarding the patient's diagnosis of cervical spine tumor.  The patient presented with neck pain and some tingling in the upper shoulder. The patient also describes some history of left arm numbness and weakness which has improved since she began Decadron. He had a recent noted med onc.   Histology and Location of Primary Cancer: C-5 lesion  Sites of Visceral and Bony Metastatic Disease: C-5 mild cord compression  Location(s) of Symptomatic Metastases: pain,tingling numbness on B/L shoulders and left upper extremity  Past/Anticipated chemotherapy by medical oncology, if any: Dr. Sullivan Lone ,MD 06/21/15, cervical collar, , will send out labs for myeloma work up , Pet/CT, Neuro consult, with f/u 07/10/15  ;  Pain on a scale of 0-10 is: Right shoulder tingling rx tramadol, prn  If Spine Met(s), symptoms, if any, include:  Bowel/Bladder retention or incontinence (please describe): normal  Numbness or weakness in extremities (please describe) Left arm numbness and weakness:most gone now, minor since taking decadron  Current Decadron regimen, if applicable: 4 mg tid Ambulatory status? Walker? Wheelchair?:ambulatory wears Hard collar on for 2 weekst  SAFETY ISSUES: No  Prior radiation? NO  Pacemaker/ICD? NO  Possible current pregnancy? NO  Is the patient on methotrexate? MO Current Complaints / other details: Married,smoker 1ppd for 10 years, quit 12/23/81,no alcohol , hx bleeding ulcers, hernia repair ,Insomnia,  actinic keratosis anxiety,  Allergies: NKA  He originally consulted with his primary care physician due to pain in his neck and shoulders, as well as weakness when raising his arms above his head. He also experienced a lot of numbness in his left arm. Initially, Ethan Stewart believed he had lyme disease due to finding a tick on himself a week before symptoms began. His primary care doctor believed he may have had a bone spur or a bulging disc, ordering an MRI to better diagnose his problem. This MRI revealed his tumor. He was given a neck brace to wear and began on steroids.   Patient presents wearing a surgical neck collar and is here with his wife. He has some tingling in his neck though not nearly what it had been before. The previous numbness in his left arm was alleviated with the steroid regimen. He has still experienced some cramping in his hand but it is bearable ever since the steroids.  He states he is right handed. He is concerned about what will happen once the tumor is removed and how his spine will respond after surgery.  He began seeing Dr. Irene Limbo this past Monday, 7/11. He is scheduled to see Dr. Irene Limbo again on Monday, 7/18.  They have not talked with a neurosurgeon yet. He was told in the ER that the neurosurgeon was thinking of radiation treatment.    PREVIOUS RADIATION THERAPY: No   PAST MEDICAL HISTORY:  has a past medical history of History of bleeding ulcers; Cervical spine tumor (07/03/2015); Bone cancer; and Anxiety.     PAST SURGICAL HISTORY: Past Surgical History  Procedure Laterality Date  .  Hernia repair       FAMILY HISTORY: family history includes Hypertension in his mother.   SOCIAL HISTORY:  reports that he quit smoking about 33 years ago. His smoking use included Cigarettes. He has a 10 pack-year smoking history. He does not have any smokeless tobacco history on file. He reports that he drinks about 1.2 oz of alcohol per week. He reports that he does not use  illicit drugs.   ALLERGIES: Review of patient's allergies indicates no known allergies.   MEDICATIONS:  Current Outpatient Prescriptions  Medication Sig Dispense Refill  . dexamethasone (DECADRON) 4 MG tablet Take 1 tablet (4 mg total) by mouth 3 (three) times daily. 90 tablet 0  . diazepam (VALIUM) 5 MG tablet Take 5 mg by mouth every 6 (six) hours as needed for anxiety (anxiety).    . simvastatin (ZOCOR) 20 MG tablet Take 20 mg by mouth daily.    . temazepam (RESTORIL) 15 MG capsule Take 15 mg by mouth at bedtime as needed for sleep (sleep).    . traMADol (ULTRAM) 50 MG tablet Take 50-100 mg by mouth every 6 (six) hours as needed for moderate pain or severe pain (pain).     No current facility-administered medications for this encounter.     REVIEW OF SYSTEMS:  A 15 point review of systems is documented in the electronic medical record. This was obtained by the nursing staff. However, I reviewed this with the patient to discuss relevant findings and make appropriate changes.  Pertinent items are noted in HPI.    PHYSICAL EXAM:  height is '6\' 1"'  (1.854 m) and weight is 220 lb (99.791 kg). His oral temperature is 98 F (36.7 C). His blood pressure is 150/78 and his pulse is 62. His respiration is 20 and oxygen saturation is 99%.   General: Well-developed, in no acute distress. HEENT: Normocephalic, atraumatic; oral cavity clear Neck: Supple without any lymphadenopathy. Surgical collar in place. Cardiovascular: Regular rate and rhythm Respiratory: Clear to auscultation bilaterally GI: Soft, nontender, normal bowel sounds Extremities: No edema present Neuro: No focal deficits     ECOG = 1  0 - Asymptomatic (Fully active, able to carry on all predisease activities without restriction)  1 - Symptomatic but completely ambulatory (Restricted in physically strenuous activity but ambulatory and able to carry out work of a light or sedentary nature. For example, light housework, office  work)  2 - Symptomatic, <50% in bed during the day (Ambulatory and capable of all self care but unable to carry out any work activities. Up and about more than 50% of waking hours)  3 - Symptomatic, >50% in bed, but not bedbound (Capable of only limited self-care, confined to bed or chair 50% or more of waking hours)  4 - Bedbound (Completely disabled. Cannot carry on any self-care. Totally confined to bed or chair)  5 - Death   Eustace Pen MM, Creech RH, Tormey DC, et al. [phone]). "Toxicity and response criteria of the [facility]". [place] Oncol. 5 (6): 649-55     LABORATORY DATA:  Lab Results  Component Value Date   WBC 15.3* 07/04/2015   HGB 15.4 07/04/2015   HCT 46.5 07/04/2015   MCV 89.4 07/04/2015   PLT 201 07/04/2015   Lab Results  Component Value Date   NA 137 07/04/2015   K 4.6 07/04/2015   CL 104 06/21/2015   CO2 25 07/04/2015   Lab Results  Component Value Date   ALT 29  07/04/2015   AST 18 07/04/2015   ALKPHOS 62 07/04/2015   BILITOT 0.55 07/04/2015      RADIOGRAPHY: Ct Chest W Contrast  06/21/2015   CLINICAL DATA:  Neck and shoulder pain for 3 weeks, known history of vertebral involvement with epidural mass lesion, check for primary  EXAM: CT CHEST, ABDOMEN, AND PELVIS WITH CONTRAST  TECHNIQUE: Multidetector CT imaging of the chest, abdomen and pelvis was performed following the standard protocol during bolus administration of intravenous contrast.  CONTRAST:  59m OMNIPAQUE IOHEXOL 300 MG/ML SOLN, 1074mOMNIPAQUE IOHEXOL 300 MG/ML SOLN  COMPARISON:  None.  FINDINGS: CT CHEST FINDINGS  The lungs are well aerated bilaterally. No focal infiltrate or sizable effusion is noted. A tiny 5 mm nodule is noted within the right lower lobe best seen on image number 35 of series 7. No other nodules are seen.  The thoracic inlet is within normal limits. The thoracic aorta in its branches are unremarkable. The pulmonary artery as visualized is within normal  limits. No hilar or mediastinal adenopathy is seen.  CT ABDOMEN AND PELVIS FINDINGS  There is air within the biliary tree likely related to prior sphincterotomy. The liver and gallbladder and spleen are otherwise within normal limits. The adrenal glands and pancreas show no focal abnormality. Parapelvic cysts are noted bilaterally within the kidneys. No renal calculi or obstructive changes are noted.  The appendix is within normal limits. Diffuse aortoiliac calcifications are noted. A retro aortic left renal vein is seen.  Bladder is well distended. The prostate and seminal vesicles are unremarkable. No acute bony abnormality is noted.  IMPRESSION: 5 mm nodule within the right lower lobe as described.  Pneumobilia consistent with a prior sphincterotomy.  No acute abnormality is identified. No findings to suggest chest etiology for the known lesion at C5 are seen.   Electronically Signed   By: MaInez Catalina.D.   On: 06/21/2015 20:37   Mr Cervical Spine Wo Contrast  06/21/2015   CLINICAL DATA:  Cervical radiculitis. Neck pain and left arm pain with bilateral shoulder pain for 3 weeks. Left arm numbness and slight weakness.  EXAM: MRI CERVICAL SPINE WITHOUT CONTRAST  TECHNIQUE: Multiplanar, multisequence MR imaging of the cervical spine was performed. No intravenous contrast was administered.  COMPARISON:  None.  FINDINGS: Images are mildly to moderately degraded by motion artifact. There is mild right convex cervical torticollis. There is no significant listhesis. Vertebral body heights are preserved.  There is moderate disc space narrowing at C5-6. There is diffusely abnormal marrow throughout the C5 vertebral body, T1 hypointense and STIR hyperintense. Abnormal marrow signal is present in the C5 pedicles with particularly STIR hyperintense signal in the right C5 pars.  There is abnormal intermediate to low T1 and intermediate to high T2 signal ventral epidural material posterior to the C5 vertebral body which  measures up to 7 mm in AP thickness and results in moderate spinal stenosis with moderate impression on the spinal cord. There is mild T2 hyperintensity in the spinal cord at this level compatible with mild cord edema. Abnormal paravertebral soft tissue is also present lateral to the C5 vertebral body and severely narrows the C5-6 neural foramina bilaterally. Tumor also extends into and narrows the right C4-5 neural foramen. Ventral epidural material extends caudally to the inferior C6 endplate level.  Mild STIR hyperintensity along the C6 superior endplate is favored to be related to degenerative disc disease rather than tumor. At C6-7, there is mild disc space height loss,  mild disc bulging, and right greater than left uncovertebral spurring which result in moderate to severe right and mild left neural foraminal stenosis without significant spinal stenosis. Severe, asymmetric left-sided facet arthrosis results in moderate left neural foraminal stenosis at C7-T1.  IMPRESSION: 1. Diffuse marrow replacement of the C5 vertebral body highly concerning for neoplasm (metastatic disease, myeloma, or lymphoma). Epidural tumor at this level results in moderate spinal stenosis with moderate impression on the spinal cord and mild cord edema. 2. Abnormal marrow signal extending into the right greater than left C5 posterior elements may reflect additional osseous tumor, with superimposed pathologic fracture not excluded given the edema in the right pars. This could be further evaluated with cervical spine CT.  These results were called by telephone at the time of interpretation on 06/21/2015 at 2:19 pm to Dr. Lona Kettle , who verbally acknowledged these results.   Electronically Signed   By: Logan Bores   On: 06/21/2015 14:28   Ct Abdomen Pelvis W Contrast  06/21/2015   CLINICAL DATA:  Neck and shoulder pain for 3 weeks, known history of vertebral involvement with epidural mass lesion, check for primary  EXAM: CT CHEST,  ABDOMEN, AND PELVIS WITH CONTRAST  TECHNIQUE: Multidetector CT imaging of the chest, abdomen and pelvis was performed following the standard protocol during bolus administration of intravenous contrast.  CONTRAST:  7m OMNIPAQUE IOHEXOL 300 MG/ML SOLN, 1010mOMNIPAQUE IOHEXOL 300 MG/ML SOLN  COMPARISON:  None.  FINDINGS: CT CHEST FINDINGS  The lungs are well aerated bilaterally. No focal infiltrate or sizable effusion is noted. A tiny 5 mm nodule is noted within the right lower lobe best seen on image number 35 of series 7. No other nodules are seen.  The thoracic inlet is within normal limits. The thoracic aorta in its branches are unremarkable. The pulmonary artery as visualized is within normal limits. No hilar or mediastinal adenopathy is seen.  CT ABDOMEN AND PELVIS FINDINGS  There is air within the biliary tree likely related to prior sphincterotomy. The liver and gallbladder and spleen are otherwise within normal limits. The adrenal glands and pancreas show no focal abnormality. Parapelvic cysts are noted bilaterally within the kidneys. No renal calculi or obstructive changes are noted.  The appendix is within normal limits. Diffuse aortoiliac calcifications are noted. A retro aortic left renal vein is seen.  Bladder is well distended. The prostate and seminal vesicles are unremarkable. No acute bony abnormality is noted.  IMPRESSION: 5 mm nodule within the right lower lobe as described.  Pneumobilia consistent with a prior sphincterotomy.  No acute abnormality is identified. No findings to suggest chest etiology for the known lesion at C5 are seen.   Electronically Signed   By: MaInez Catalina.D.   On: 06/21/2015 20:37       IMPRESSION:  The patient has a lesion in the C5 vertebral body. Unknown etiology at this time. PET scan has been ordered to look for a primary tumor. Additional workup pending in terms of lab work.  I have had a chance to speak with Dr. JoRonnald Ramprom neurosurgery. He is aware of the  patient and is also following his case. The patient at this time may be a good surgical candidate, depending on his final results. If the patient had a extremely radiosensitive tumor such as myeloma or lymphoma, then radiation alone may be reasonable. Otherwise, surgery followed by postoperative radiation treatment depending on the final result may also be a reasonable consideration. I discussed with  the patient today that we do not have enough information to formulate a final plan. All of his questions otherwise were answered today.   PLAN: We will decide on radiation treatment based on biopsy and PET results, as well as the opinion of a neurosurgeon. If a good target for biopsy is apparent on PET scan, then this can be pursued.  Advised for him to contact us if his symptoms worsen, if this occurs on the weekend go to the ER.     I spent 60 minutes face to face with the patient and more than 50% of that time was spent in counseling and/or coordination of care.   This document serves as a record of services personally performed by Kyung Rudd, MD. It was created on his behalf by Arlyce Harman, a trained medical scribe. The creation of this record is based on the scribe's personal observations and the provider's statements to them. This document has been checked and approved by the attending provider.  ________________________________   Jodelle Gross, MD, PhD   **Disclaimer: This note was dictated with voice recognition software. Similar sounding words can inadvertently be transcribed and this note may contain transcription errors which may not have been corrected upon publication of note.**

## 2015-07-05 NOTE — Progress Notes (Signed)
Please see the Nurse Progress Note in the MD Initial Consult Encounter for this patient. 

## 2015-07-06 ENCOUNTER — Telehealth: Payer: Self-pay | Admitting: Hematology

## 2015-07-06 ENCOUNTER — Encounter: Payer: Self-pay | Admitting: *Deleted

## 2015-07-06 ENCOUNTER — Other Ambulatory Visit: Payer: Self-pay | Admitting: *Deleted

## 2015-07-06 ENCOUNTER — Telehealth: Payer: Self-pay | Admitting: *Deleted

## 2015-07-06 NOTE — Telephone Encounter (Signed)
Due to training moved 7/18 f/u from 8:30 am to 1 pm. Spoke with patient he is aware.

## 2015-07-06 NOTE — Telephone Encounter (Signed)
Informed patient that PET appointment has been rescheduled from 7/20 to 7/18 @7am .  NPO after midnight.  Pt verbalized understanding.

## 2015-07-06 NOTE — Progress Notes (Signed)
Central Scheduling called to follow up on scheduling of patient PET/CT scan.  Schedulers made aware that scan is urgent.  Per scheduler, appointment will be made immediately.

## 2015-07-06 NOTE — Telephone Encounter (Signed)
Message left for new patient coordinator at [facility] to follow up with status of appointment scheduling with Dr Ellene Route.

## 2015-07-06 NOTE — Addendum Note (Signed)
Addended by: Sullivan Lone on: 07/06/2015 02:01 PM   Modules accepted: Orders

## 2015-07-07 ENCOUNTER — Other Ambulatory Visit: Payer: Self-pay | Admitting: *Deleted

## 2015-07-07 ENCOUNTER — Telehealth: Payer: Self-pay | Admitting: Hematology

## 2015-07-07 ENCOUNTER — Encounter: Payer: Self-pay | Admitting: Radiation Oncology

## 2015-07-07 DIAGNOSIS — C8330 Diffuse large B-cell lymphoma, unspecified site: Secondary | ICD-10-CM | POA: Insufficient documentation

## 2015-07-07 DIAGNOSIS — C7951 Secondary malignant neoplasm of bone: Secondary | ICD-10-CM

## 2015-07-07 NOTE — Telephone Encounter (Signed)
Records faxed to [facility]

## 2015-07-10 ENCOUNTER — Ambulatory Visit (HOSPITAL_COMMUNITY)
Admission: RE | Admit: 2015-07-10 | Discharge: 2015-07-10 | Disposition: A | Discharge status: Home / Self Care | Payer: PPO | Source: Ambulatory Visit | Attending: Hematology | Admitting: Hematology

## 2015-07-10 ENCOUNTER — Encounter: Payer: Self-pay | Admitting: Hematology

## 2015-07-10 ENCOUNTER — Ambulatory Visit (HOSPITAL_BASED_OUTPATIENT_CLINIC_OR_DEPARTMENT_OTHER): Payer: PPO | Admitting: Hematology

## 2015-07-10 ENCOUNTER — Other Ambulatory Visit: Payer: PPO

## 2015-07-10 VITALS — BP 155/75 | HR 71 | Temp 98.2°F | Resp 20 | Ht 73.0 in | Wt 220.0 lb

## 2015-07-10 DIAGNOSIS — C7951 Secondary malignant neoplasm of bone: Secondary | ICD-10-CM | POA: Diagnosis not present

## 2015-07-10 DIAGNOSIS — D492 Neoplasm of unspecified behavior of bone, soft tissue, and skin: Secondary | ICD-10-CM | POA: Diagnosis not present

## 2015-07-10 DIAGNOSIS — C799 Secondary malignant neoplasm of unspecified site: Secondary | ICD-10-CM

## 2015-07-10 DIAGNOSIS — C801 Malignant (primary) neoplasm, unspecified: Secondary | ICD-10-CM

## 2015-07-10 LAB — GLUCOSE, CAPILLARY: Glucose-Capillary: 89 mg/dL (ref 65–99)

## 2015-07-10 MED ORDER — FLUDEOXYGLUCOSE F - 18 (FDG) INJECTION
10.7600 | Freq: Once | INTRAVENOUS | Status: AC | PRN
  Administered 2015-07-10: 10.76 via INTRAVENOUS

## 2015-07-10 NOTE — Progress Notes (Signed)
Ethan Stewart  HEMATOLOGY ONCOLOGY PROGRESS NOTE  Patient Care Team: Ethan Kettle, MD as PCP - General (Family Medicine) Ethan Genera, MD as Consulting Physician (Hematology)  SUMMARY OF ONCOLOGIC HISTORY:   Osseous metastasis   06/21/2015 Imaging CT Chest/abd/pelvis: IMPRESSION: 5 mm nodule within the right lower lobe as described.  Pneumobilia consistent with a prior sphincterotomy.  No acute abnormality is identified. No findings to suggest chest etiology for the known lesion at C5 are seen.   06/21/2015 Imaging MRI c spine1. Diffuse marrow replacement of the C5 vertebral body highly concerning for neoplasm (metastatic disease, myeloma, or lymphoma). Epidural tumor at this level results in moderate spinal stenosis with moderate impression on the spinal cord    07/10/2015 PET scan 1. The previously demonstrated abnormal C5 vertebral body is hypermetabolic. No other osseous lesions demonstrated. 2. There are small hypermetabolic lymph nodes within the left mesentery and both inguinal regions.     INTERVAL HISTORY:  Ethan Stewart is here for his one-week follow-up after his initial clinic visit with medical oncology on 07/04/2015. He notes no new worsening neck pain or weakness in his upper extremities. No problems with balance or gait. Bowel or bladder incontinence or retention. He notes some shoulder pains on and off are about the same but do fluctuate. He notes that he has been using his cervical collar very religiously. Denies any falls. He notes that the steroids still are making him eat a lot. He had a PET CT scan today the results of which were reviewed with him in details.  REVIEW OF SYSTEMS:   Constitutional: Denies fevers, chills or abnormal weight loss Eyes: Denies blurriness of vision Ears, nose, mouth, throat, and face: Denies mucositis or sore throat Respiratory: Denies cough, dyspnea or wheezes Cardiovascular: Denies palpitation, chest discomfort or lower extremity  swelling Gastrointestinal:  Denies nausea, heartburn or change in bowel habits Skin: Denies abnormal skin rashes Lymphatics: Denies new lymphadenopathy or easy bruising Neurological:Denies numbness, tingling or new weaknesses Behavioral/Psych: Mood is stable, no new changes  All other systems were reviewed with the patient and are negative.  I have reviewed the past medical history, past surgical history, social history and family history with the patient and they are unchanged from previous note.  ALLERGIES:  has No Known Allergies.  MEDICATIONS:  Current Outpatient Prescriptions  Medication Sig Dispense Refill  . dexamethasone (DECADRON) 4 MG tablet Take 1 tablet (4 mg total) by mouth 3 (three) times daily. 90 tablet 0  . diazepam (VALIUM) 5 MG tablet Take 5 mg by mouth every 6 (six) hours as needed for anxiety (anxiety).    . simvastatin (ZOCOR) 20 MG tablet Take 20 mg by mouth daily.    . temazepam (RESTORIL) 15 MG capsule Take 15 mg by mouth at bedtime as needed for sleep (sleep).    . traMADol (ULTRAM) 50 MG tablet Take 50-100 mg by mouth every 6 (six) hours as needed for moderate pain or severe pain (pain).     No current facility-administered medications for this visit.    PHYSICAL EXAMINATION: ECOG PERFORMANCE STATUS: 1 - Symptomatic but completely ambulatory  Filed Vitals:   07/10/15 1307  BP: 155/75  Pulse: 71  Temp: 98.2 F (36.8 C)  Resp: 20   Filed Weights   07/10/15 1307  Weight: 220 lb (99.791 kg)    GENERAL:alert, no distress and comfortable SKIN: skin color, texture, turgor are normal, no rashes or significant lesions EYES: normal, Conjunctiva are pink and non-injected, sclera clear OROPHARYNX:no  exudate, no erythema and lips, buccal mucosa, and tongue normal  NECK: supple, thyroid normal size, non-tender, without nodularity LYMPH: small palpable left inguinal adenopathy. LUNGS: clear to auscultation and percussion with normal breathing effort HEART:  regular rate & rhythm and no murmurs and no lower extremity edema ABDOMEN:abdomen soft, non-tender and normal bowel sounds Musculoskeletal:no cyanosis of digits and no clubbing  NEURO: alert & oriented x 3 with fluent speech, no focal motor/sensory deficits  LABORATORY DATA:  I have reviewed the data as listed    Component Value Date/Time   NA 137 07/04/2015 0942   NA 139 06/21/2015 1709   K 4.6 07/04/2015 0942   K 4.1 06/21/2015 1709   CL 104 06/21/2015 1709   CO2 25 07/04/2015 0942   CO2 26 06/21/2015 1709   GLUCOSE 96 07/04/2015 0942   GLUCOSE 156* 06/21/2015 1709   BUN 24.0 07/04/2015 0942   BUN 18 06/21/2015 1709   CREATININE 0.8 07/04/2015 0942   CREATININE 0.99 06/21/2015 1709   CALCIUM 9.0 07/04/2015 0942   CALCIUM 9.2 06/21/2015 1709   PROT 6.1* 07/04/2015 0942   ALBUMIN 3.1* 07/04/2015 0942   AST 18 07/04/2015 0942   ALT 29 07/04/2015 0942   ALKPHOS 62 07/04/2015 0942   BILITOT 0.55 07/04/2015 0942   GFRNONAA >60 06/21/2015 1709   GFRAA >60 06/21/2015 1709    No results found for: SPEP, UPEP  Lab Results  Component Value Date   WBC 15.3* 07/04/2015   NEUTROABS 12.4* 07/04/2015   HGB 15.4 07/04/2015   HCT 46.5 07/04/2015   MCV 89.4 07/04/2015   PLT 201 07/04/2015      Chemistry      Component Value Date/Time   NA 137 07/04/2015 0942   NA 139 06/21/2015 1709   K 4.6 07/04/2015 0942   K 4.1 06/21/2015 1709   CL 104 06/21/2015 1709   CO2 25 07/04/2015 0942   CO2 26 06/21/2015 1709   BUN 24.0 07/04/2015 0942   BUN 18 06/21/2015 1709   CREATININE 0.8 07/04/2015 0942   CREATININE 0.99 06/21/2015 1709      Component Value Date/Time   CALCIUM 9.0 07/04/2015 0942   CALCIUM 9.2 06/21/2015 1709   ALKPHOS 62 07/04/2015 0942   AST 18 07/04/2015 0942   ALT 29 07/04/2015 0942   BILITOT 0.55 07/04/2015 0942       RADIOGRAPHIC STUDIES: I have personally reviewed the radiological images as listed and agreed with the findings in the report. Nm Pet Image  Initial (pi) Skull Base To Thigh  07/10/2015   CLINICAL DATA:  Initial treatment strategy for C5 metastasis of unknown primary.  EXAM: NUCLEAR MEDICINE PET SKULL BASE TO THIGH  TECHNIQUE: 10.76 mCi F-18 FDG was injected intravenously. Full-ring PET imaging was performed from the skull base to thigh after the radiotracer. CT data was obtained and used for attenuation correction and anatomic localization.  FASTING BLOOD GLUCOSE:  Value: 89 mg/dl  COMPARISON:  CTs of the chest, abdomen and pelvis 06/21/2015. Cervical spine MRI 06/21/2015.  FINDINGS: NECK  No hypermetabolic cervical lymph nodes are identified.There are no lesions of the pharyngeal mucosal space. There is abnormal activity within the C5 vertebral body, described under the skeletal section below.  CHEST  There are no hypermetabolic mediastinal, hilar or axillary lymph nodes. There is no abnormal pulmonary activity. 5 mm right lower lobe nodule on image 44 is unchanged.  ABDOMEN/PELVIS  There is no hypermetabolic activity within the liver, adrenal glands, spleen or pancreas.  There is a hypermetabolic left mesenteric lymph node, measuring 1.8 x 1.6 cm on image 147. This has an SUV max of 6.7. There are small hypermetabolic inguinal lymph nodes bilaterally with an SUV max of 4.3 on the right and 6.2 on the left. No other hypermetabolic lymph nodes identified within the abdomen or pelvis. Pneumobilia and mild atherosclerosis again noted.  SKELETON  Corresponding with the abnormality on MRI, there is abnormal hypermetabolic activity within the C5 vertebral body and its posterior elements, asymmetric to the right. This has an SUV max of 9.9. No other abnormal osseous activity identified.  IMPRESSION: 1. The previously demonstrated abnormal C5 vertebral body is hypermetabolic. No other osseous lesions demonstrated. 2. There are small hypermetabolic lymph nodes within the left mesentery and both inguinal regions. 3. No hypermetabolic activity identified within  the lungs or solid visceral organs. 4. The overall constellation of findings is most consistent with lymphoma, although metastatic carcinoma would be a consideration. No primary identified. The left inguinal lymph node may be most amenable to tissue sampling.   Electronically Signed   By: Richardean Sale M.D.   On: 07/10/2015 09:01     ASSESSMENT & PLAN:  Osseous metastasis Cervical spine tumor Patient presented with neck pain for 4-6 weeks with evidence of some proximal muscle weakness and bilateral shoulder pains and left upper extremity discomfort. No gait instability. No bowel or bladder dysfunction. MRI of the C-spine showed C5 bone marrow replacement concerning for neoplasm with epidural component and mild cord compression.  The differential diagnosis at this point is broad including metastatic malignancy/lymphoma. Based on his normal Lambda free light chains and negative M spike on SPEP, And normal sedimentation rate multiple myeloma is less likely.  PET CT scan today shows significantly FDG avid C5 mass and couple of small FDG avid inguinal lymph nodes and possibly some mesenteric lymph nodes. Overall picture concerning for lymphoma. Normal LDH might make an indolent lymphoma a possibility.  Patient's neck symptoms are relatively stable with no overt new progressive symptoms of cord compression on examination today. Plan  -patient is going to be seeing neurosurgery tomorrow and further workup and treatment planning is based on neurosurgery recommendations. -if neurosurgery decides to operate that would address his cord compression issues as well as provide tissue for definitive diagnosis and allow for more rapid tapering of his steroids. -If neurosurgery determines that his C-spine is stable we might need to get a core needle biopsy of the C5 mass since the diagnostic yield on his inguinal lymph node biopsy alone is questionable and we do not want to delay his treatment planning any  further. -He has been on high-dose steroids for more than 2 weeks now and we are keen to start tapering him after having definitive intervention for his C5 mass since he is at high risk for steroid myopathy. -I appreciated radiation oncology input by Dr. Lisbeth Renshaw. -PET scan shows some small inguinal adenopathy though the diagnostic yield on this in the presence of significant steroid use is unclear. We will however refer him to Gen. Surgery for left inguinal lymph node excisional biopsy for diagnostic and staging purposes for surgical pathology and lymphoma workup.  Return to care with medical oncology Dr. Irene Limbo in 2 weeks, earlier if any new acute concerns. The patient has my card and clinic number to call if any acute concerns arise. He has been counseled on symptoms of worsening cord compression and recommended to seek immediate emergency room attention if any occur.  Orders Placed This Encounter  Procedures  . CBC with Differential/Platelet    Standing Status: Future     Number of Occurrences:      Standing Expiration Date: 08/13/2016  . Comprehensive metabolic panel    Standing Status: Future     Number of Occurrences:      Standing Expiration Date: 08/13/2016  . Ambulatory referral to General Surgery    Referral Priority:  Urgent    Referral Type:  Surgical    Referral Reason:  Specialty Services Required    Requested Specialty:  General Surgery    Number of Visits Requested:  1   I spent 30 minutes counseling the patient face to face. The total time spent in the appointment was 40 minutes and more than 50% was on counseling and review of test results     Sullivan Lone, MD 07/10/2015 2:25 PM  Hematology oncology physician [place]

## 2015-07-10 NOTE — Assessment & Plan Note (Addendum)
Cervical spine tumor Patient presented with neck pain for 4-6 weeks with evidence of some proximal muscle weakness and bilateral shoulder pains and left upper extremity discomfort. No gait instability. No bowel or bladder dysfunction. MRI of the C-spine showed C5 bone marrow replacement concerning for neoplasm with epidural component and mild cord compression.  The differential diagnosis at this point is broad including metastatic malignancy/lymphoma. Based on his normal Lambda free light chains and negative M spike on SPEP, And normal sedimentation rate multiple myeloma is less likely.  PET CT scan today shows significantly FDG avid C5 mass and couple of small FDG avid inguinal lymph nodes and possibly some mesenteric lymph nodes. Overall picture concerning for lymphoma. Normal LDH might make an indolent lymphoma a possibility.  Patient's neck symptoms are relatively stable with no overt new progressive symptoms of cord compression on examination today. Plan  -patient is going to be seeing neurosurgery tomorrow and further workup and treatment planning is based on neurosurgery recommendations. -if neurosurgery decides to operate that would address his cord compression issues as well as provide tissue for definitive diagnosis and allow for more rapid tapering of his steroids. -If neurosurgery determines that his C-spine is stable we might need to get a core needle biopsy of the C5 mass since the diagnostic yield on his inguinal lymph node biopsy alone is questionable and we do not want to delay his treatment planning any further. -He has been on high-dose steroids for more than 2 weeks now and we are keen to start tapering him after having definitive intervention for his C5 mass since he is at high risk for steroid myopathy. -I appreciated radiation oncology input by Dr. Lisbeth Renshaw. -PET scan shows some small inguinal adenopathy though the diagnostic yield on this in the presence of significant steroid use  is unclear. We will however refer him to Gen. Surgery for left inguinal lymph node excisional biopsy for diagnostic and staging purposes for surgical pathology and lymphoma workup.  Return to care with medical oncology Dr. Irene Limbo in 2 weeks, earlier if any new acute concerns. The patient has my card and clinic number to call if any acute concerns arise. He has been counseled on symptoms of worsening cord compression and recommended to seek immediate emergency room attention if any occur.

## 2015-07-11 ENCOUNTER — Telehealth: Payer: Self-pay | Admitting: Hematology

## 2015-07-11 LAB — UPEP/TP, 24-HR URINE
Collection Interval: 24 hours
TOTAL PROTEIN, URINE/DAY: 203 mg/d — AB (ref 50–100)
Total Protein, Urine: 7 mg/dL
Total Volume, Urine: 2900 mL

## 2015-07-11 NOTE — Telephone Encounter (Signed)
Faxed medical records to [facility]

## 2015-07-12 ENCOUNTER — Encounter (HOSPITAL_COMMUNITY): Payer: PPO

## 2015-07-13 ENCOUNTER — Encounter: Payer: Self-pay | Admitting: *Deleted

## 2015-07-13 NOTE — Progress Notes (Signed)
[facility] Psychosocial Distress Screening Clinical Social Work  Clinical Social Work was referred by distress screening protocol.  The patient scored an initial 10 on the Psychosocial Distress Thermometer which indicates severe distress. His last distress screen he scored a 5, which is mild distress. Clinical Social Worker phoned pt to assess for distress and other psychosocial needs. Pt reports he is "feeling better about treatment". CSW introduced self and explained role of CSW and Family Support Team. Pt very open to talking with CSW and agrees to follow up as needed.   ONCBCN DISTRESS SCREENING 07/05/2015  Screening Type Initial Screening  Distress experienced in past week (1-10) 10  Emotional problem type Adjusting to illness  Information Concerns Type Lack of info about diagnosis  Physical Problem type Getting around  Physician notified of physical symptoms Yes  Referral to clinical social work Yes  Referral to financial advocate Yes   [place] 07/10/2015  Screening Type Initial Screening  Distress experienced in past week (1-10) 5  Emotional problem type Adjusting to illness  Information Concerns Type   Physical Problem type   Physician notified of physical symptoms Yes  Referral to clinical social work   Referral to financial advocate     Clinical Social Worker follow up needed: No.  If yes, follow up plan: Loren Racer, [place]  [facility] Phone: [phone] Fax: [phone]

## 2015-07-17 ENCOUNTER — Encounter: Payer: Self-pay | Admitting: Hematology

## 2015-07-17 MED ORDER — OMEPRAZOLE 20 MG PO CPDR: 20.0000 mg | DELAYED_RELEASE_CAPSULE | Freq: Every day | ORAL | Status: DC

## 2015-07-17 MED ORDER — OMEPRAZOLE 40 MG PO CPDR: 40.0000 mg | DELAYED_RELEASE_CAPSULE | Freq: Every day | ORAL | Status: DC

## 2015-07-17 NOTE — Addendum Note (Signed)
Addended by: Sullivan Lone on: 07/17/2015 02:16 PM   Modules accepted: Orders, Medications

## 2015-07-17 NOTE — Addendum Note (Signed)
Addended by: Sullivan Lone on: 07/17/2015 04:11 PM   Modules accepted: Orders

## 2015-07-19 ENCOUNTER — Telehealth: Payer: Self-pay | Admitting: *Deleted

## 2015-07-19 NOTE — Telephone Encounter (Signed)
Patient returned call and is hoping Surgery after he talks with Dr. Ronnald Ramp after his MRI, thanked patient for calling back and I have inbasketed Dr. Lisbeth Renshaw with this, 2:01 PM

## 2015-07-19 NOTE — Telephone Encounter (Signed)
Called Ethan Stewart home phone [phone] voice message to call me back to check his status on what plans, his Pet scan was done 07/10/15. , he still has , I see that he has MRI scheduled 07/20/15  Sees Dr. Irene Limbo 07/24/2015, and will See Dr. Ronnald Ramp on 8//01/2015 to  Review & discuss results whether to have  surgery or not, ,  He also has appt with [facility] on 07/25/15 which he cancelled for later that week, our number [phone], will in basket Dr. Lisbeth Renshaw 1:42 PM

## 2015-07-21 ENCOUNTER — Other Ambulatory Visit: Payer: Self-pay | Admitting: Neurological Surgery

## 2015-07-24 ENCOUNTER — Telehealth: Payer: Self-pay | Admitting: Hematology

## 2015-07-24 ENCOUNTER — Ambulatory Visit (HOSPITAL_BASED_OUTPATIENT_CLINIC_OR_DEPARTMENT_OTHER): Payer: PPO | Admitting: Hematology

## 2015-07-24 ENCOUNTER — Other Ambulatory Visit (HOSPITAL_BASED_OUTPATIENT_CLINIC_OR_DEPARTMENT_OTHER): Payer: PPO

## 2015-07-24 ENCOUNTER — Encounter: Payer: Self-pay | Admitting: Hematology

## 2015-07-24 VITALS — BP 158/95 | HR 87 | Temp 98.6°F | Resp 18 | Ht 73.0 in | Wt 211.6 lb

## 2015-07-24 DIAGNOSIS — C801 Malignant (primary) neoplasm, unspecified: Secondary | ICD-10-CM

## 2015-07-24 DIAGNOSIS — C7951 Secondary malignant neoplasm of bone: Secondary | ICD-10-CM

## 2015-07-24 DIAGNOSIS — K219 Gastro-esophageal reflux disease without esophagitis: Secondary | ICD-10-CM

## 2015-07-24 DIAGNOSIS — K2970 Gastritis, unspecified, without bleeding: Secondary | ICD-10-CM

## 2015-07-24 DIAGNOSIS — B370 Candidal stomatitis: Secondary | ICD-10-CM | POA: Diagnosis not present

## 2015-07-24 DIAGNOSIS — D492 Neoplasm of unspecified behavior of bone, soft tissue, and skin: Secondary | ICD-10-CM

## 2015-07-24 DIAGNOSIS — C799 Secondary malignant neoplasm of unspecified site: Secondary | ICD-10-CM

## 2015-07-24 LAB — COMPREHENSIVE METABOLIC PANEL (CC13)
ALK PHOS: 60 U/L (ref 40–150)
ALT: 74 U/L — ABNORMAL HIGH (ref 0–55)
AST: 33 U/L (ref 5–34)
Albumin: 3.2 g/dL — ABNORMAL LOW (ref 3.5–5.0)
Anion Gap: 8 mEq/L (ref 3–11)
BUN: 27.7 mg/dL — ABNORMAL HIGH (ref 7.0–26.0)
CO2: 29 meq/L (ref 22–29)
Calcium: 9 mg/dL (ref 8.4–10.4)
Chloride: 104 mEq/L (ref 98–109)
Creatinine: 0.9 mg/dL (ref 0.7–1.3)
EGFR: 88 mL/min/{1.73_m2} — ABNORMAL LOW (ref >90)
Glucose: 130 mg/dl (ref 70–140)
Potassium: 4 mEq/L (ref 3.5–5.1)
Sodium: 142 mEq/L (ref 136–145)
TOTAL PROTEIN: 6 g/dL — AB (ref 6.4–8.3)
Total Bilirubin: 0.65 mg/dL (ref 0.20–1.20)

## 2015-07-24 LAB — CBC WITH DIFFERENTIAL/PLATELET
BASO%: 0.5 % (ref 0.0–2.0)
Basophils Absolute: 0.1 10*3/uL (ref 0.0–0.1)
EOS%: 0 % (ref 0.0–7.0)
Eosinophils Absolute: 0 10*3/uL (ref 0.0–0.5)
HEMATOCRIT: 44.7 % (ref 38.4–49.9)
HEMOGLOBIN: 14.9 g/dL (ref 13.0–17.1)
LYMPH#: 2.6 10*3/uL (ref 0.9–3.3)
LYMPH%: 21 % (ref 14.0–49.0)
MCH: 29.7 pg (ref 27.2–33.4)
MCHC: 33.4 g/dL (ref 32.0–36.0)
MCV: 88.9 fL (ref 79.3–98.0)
MONO#: 0.4 10*3/uL (ref 0.1–0.9)
MONO%: 3.5 % (ref 0.0–14.0)
NEUT#: 9.2 10*3/uL — ABNORMAL HIGH (ref 1.5–6.5)
NEUT%: 75 % (ref 39.0–75.0)
PLATELETS: 160 10*3/uL (ref 140–400)
RBC: 5.02 10*6/uL (ref 4.20–5.82)
RDW: 14.8 % — ABNORMAL HIGH (ref 11.0–14.6)
WBC: 12.3 10*3/uL — ABNORMAL HIGH (ref 4.0–10.3)

## 2015-07-24 MED ORDER — NYSTATIN 100000 UNIT/ML MT SUSP: 5.0000 mL | Freq: Four times a day (QID) | OROMUCOSAL | Status: AC

## 2015-07-24 MED ORDER — OMEPRAZOLE 40 MG PO CPDR: 40.0000 mg | DELAYED_RELEASE_CAPSULE | Freq: Every day | ORAL | Status: DC

## 2015-07-24 NOTE — Telephone Encounter (Signed)
Pt confirmed MD visit per 08/01 per POF, gave pt avs and calendar... KJ

## 2015-07-25 ENCOUNTER — Encounter (HOSPITAL_COMMUNITY): Payer: Self-pay | Admitting: *Deleted

## 2015-07-26 ENCOUNTER — Inpatient Hospital Stay (HOSPITAL_COMMUNITY): Payer: PPO | Admitting: Anesthesiology

## 2015-07-26 ENCOUNTER — Encounter (HOSPITAL_COMMUNITY): Payer: Self-pay | Admitting: *Deleted

## 2015-07-26 ENCOUNTER — Encounter (HOSPITAL_COMMUNITY)
Admission: RE | Disposition: A | Discharge status: Home / Self Care | Payer: Self-pay | Source: Ambulatory Visit | Attending: Neurological Surgery

## 2015-07-26 ENCOUNTER — Encounter: Payer: Self-pay | Admitting: Hematology

## 2015-07-26 ENCOUNTER — Inpatient Hospital Stay (HOSPITAL_COMMUNITY): Payer: PPO

## 2015-07-26 ENCOUNTER — Ambulatory Visit (HOSPITAL_COMMUNITY)
Admission: RE | Admit: 2015-07-26 | Discharge: 2015-07-27 | Disposition: A | Discharge status: Home / Self Care | Payer: PPO | Source: Ambulatory Visit | Attending: Neurological Surgery | Admitting: Neurological Surgery

## 2015-07-26 DIAGNOSIS — C8339 Diffuse large B-cell lymphoma, extranodal and solid organ sites: Secondary | ICD-10-CM | POA: Diagnosis not present

## 2015-07-26 DIAGNOSIS — Z419 Encounter for procedure for purposes other than remedying health state, unspecified: Secondary | ICD-10-CM

## 2015-07-26 DIAGNOSIS — D492 Neoplasm of unspecified behavior of bone, soft tissue, and skin: Secondary | ICD-10-CM | POA: Diagnosis present

## 2015-07-26 DIAGNOSIS — F419 Anxiety disorder, unspecified: Secondary | ICD-10-CM | POA: Insufficient documentation

## 2015-07-26 DIAGNOSIS — Z981 Arthrodesis status: Secondary | ICD-10-CM

## 2015-07-26 DIAGNOSIS — Z79899 Other long term (current) drug therapy: Secondary | ICD-10-CM | POA: Insufficient documentation

## 2015-07-26 DIAGNOSIS — K219 Gastro-esophageal reflux disease without esophagitis: Secondary | ICD-10-CM | POA: Diagnosis not present

## 2015-07-26 DIAGNOSIS — M5022 Other cervical disc displacement, mid-cervical region: Secondary | ICD-10-CM | POA: Insufficient documentation

## 2015-07-26 DIAGNOSIS — Z87891 Personal history of nicotine dependence: Secondary | ICD-10-CM | POA: Insufficient documentation

## 2015-07-26 DIAGNOSIS — C8330 Diffuse large B-cell lymphoma, unspecified site: Secondary | ICD-10-CM | POA: Diagnosis not present

## 2015-07-26 DIAGNOSIS — M47812 Spondylosis without myelopathy or radiculopathy, cervical region: Secondary | ICD-10-CM | POA: Insufficient documentation

## 2015-07-26 HISTORY — DX: Other specified bacterial intestinal infections: A04.8

## 2015-07-26 HISTORY — DX: Gastro-esophageal reflux disease without esophagitis: K21.9

## 2015-07-26 HISTORY — DX: Personal history of other medical treatment: Z92.89

## 2015-07-26 HISTORY — PX: ANTERIOR CERVICAL CORPECTOMY: SHX1159

## 2015-07-26 LAB — SURGICAL PCR SCREEN
MRSA, PCR: NEGATIVE
Staphylococcus aureus: NEGATIVE

## 2015-07-26 LAB — PREPARE RBC (CROSSMATCH)

## 2015-07-26 LAB — ABO/RH: ABO/RH(D): B NEG

## 2015-07-26 SURGERY — ANTERIOR CERVICAL CORPECTOMY
Anesthesia: General | Site: Neck

## 2015-07-26 MED ORDER — DIAZEPAM 5 MG PO TABS: 5.0000 mg | ORAL_TABLET | Freq: Four times a day (QID) | ORAL | Status: DC | PRN

## 2015-07-26 MED ORDER — ONDANSETRON HCL 4 MG/2ML IJ SOLN
INTRAMUSCULAR | Status: DC | PRN
  Administered 2015-07-26: 4 mg via INTRAVENOUS

## 2015-07-26 MED ORDER — SODIUM CHLORIDE 0.9 % IV SOLN: Freq: Once | INTRAVENOUS | Status: DC

## 2015-07-26 MED ORDER — PROPOFOL 10 MG/ML IV BOLUS
INTRAVENOUS | Status: AC
  Filled 2015-07-26: qty 20

## 2015-07-26 MED ORDER — MUPIROCIN 2 % EX OINT
TOPICAL_OINTMENT | CUTANEOUS | Status: AC
  Administered 2015-07-26: 1 via TOPICAL
  Filled 2015-07-26: qty 22

## 2015-07-26 MED ORDER — PANTOPRAZOLE SODIUM 40 MG PO TBEC
40.0000 mg | DELAYED_RELEASE_TABLET | Freq: Every day | ORAL | Status: DC
  Administered 2015-07-27: 40 mg via ORAL
  Filled 2015-07-26 (×2): qty 1

## 2015-07-26 MED ORDER — LIDOCAINE HCL (CARDIAC) 20 MG/ML IV SOLN
INTRAVENOUS | Status: AC
  Filled 2015-07-26: qty 5

## 2015-07-26 MED ORDER — OXYCODONE HCL 5 MG PO TABS: 5.0000 mg | ORAL_TABLET | Freq: Once | ORAL | Status: AC | PRN

## 2015-07-26 MED ORDER — PHENYLEPHRINE 40 MCG/ML (10ML) SYRINGE FOR IV PUSH (FOR BLOOD PRESSURE SUPPORT)
PREFILLED_SYRINGE | INTRAVENOUS | Status: AC
  Filled 2015-07-26: qty 10

## 2015-07-26 MED ORDER — FENTANYL CITRATE (PF) 250 MCG/5ML IJ SOLN
INTRAMUSCULAR | Status: AC
  Filled 2015-07-26: qty 5

## 2015-07-26 MED ORDER — THROMBIN 20000 UNITS EX SOLR
CUTANEOUS | Status: DC | PRN
  Administered 2015-07-26: 20 mL via TOPICAL

## 2015-07-26 MED ORDER — MORPHINE SULFATE 2 MG/ML IJ SOLN: 1.0000 mg | INTRAMUSCULAR | Status: DC | PRN

## 2015-07-26 MED ORDER — PHENOL 1.4 % MT LIQD: 1.0000 | OROMUCOSAL | Status: DC | PRN

## 2015-07-26 MED ORDER — GLYCOPYRROLATE 0.2 MG/ML IJ SOLN
INTRAMUSCULAR | Status: DC | PRN
  Administered 2015-07-26: 0.2 mg via INTRAVENOUS
  Administered 2015-07-26: .5 mg via INTRAVENOUS

## 2015-07-26 MED ORDER — STERILE WATER FOR INJECTION IJ SOLN
INTRAMUSCULAR | Status: AC
  Filled 2015-07-26: qty 10

## 2015-07-26 MED ORDER — FENTANYL CITRATE (PF) 100 MCG/2ML IJ SOLN
INTRAMUSCULAR | Status: DC | PRN
  Administered 2015-07-26 (×2): 100 ug via INTRAVENOUS
  Administered 2015-07-26: 50 ug via INTRAVENOUS

## 2015-07-26 MED ORDER — ACETAMINOPHEN 325 MG PO TABS: 650.0000 mg | ORAL_TABLET | ORAL | Status: DC | PRN

## 2015-07-26 MED ORDER — POTASSIUM CHLORIDE IN NACL 20-0.9 MEQ/L-% IV SOLN
INTRAVENOUS | Status: DC
  Administered 2015-07-26: 19:00:00 via INTRAVENOUS
  Filled 2015-07-26: qty 1000

## 2015-07-26 MED ORDER — CEFAZOLIN SODIUM 1-5 GM-% IV SOLN
1.0000 g | Freq: Three times a day (TID) | INTRAVENOUS | Status: AC
  Administered 2015-07-26 – 2015-07-27 (×2): 1 g via INTRAVENOUS
  Filled 2015-07-26 (×2): qty 50

## 2015-07-26 MED ORDER — ONDANSETRON HCL 4 MG/2ML IJ SOLN: 4.0000 mg | INTRAMUSCULAR | Status: DC | PRN

## 2015-07-26 MED ORDER — MIDAZOLAM HCL 2 MG/2ML IJ SOLN
INTRAMUSCULAR | Status: AC
  Filled 2015-07-26: qty 4

## 2015-07-26 MED ORDER — CEFAZOLIN SODIUM-DEXTROSE 2-3 GM-% IV SOLR
2.0000 g | INTRAVENOUS | Status: AC
Start: 2015-07-27 — End: 2015-07-26
  Administered 2015-07-26: 2 g via INTRAVENOUS

## 2015-07-26 MED ORDER — LIDOCAINE HCL (CARDIAC) 20 MG/ML IV SOLN
INTRAVENOUS | Status: DC | PRN
  Administered 2015-07-26: 100 mg via INTRAVENOUS
  Administered 2015-07-26: 100 mg via INTRATRACHEAL

## 2015-07-26 MED ORDER — MENTHOL 3 MG MT LOZG: 1.0000 | LOZENGE | OROMUCOSAL | Status: DC | PRN

## 2015-07-26 MED ORDER — BUPIVACAINE HCL (PF) 0.25 % IJ SOLN
INTRAMUSCULAR | Status: DC | PRN
  Administered 2015-07-26: 4 mL

## 2015-07-26 MED ORDER — THROMBIN 5000 UNITS EX SOLR
CUTANEOUS | Status: DC | PRN
  Administered 2015-07-26 (×2): 10 mL via TOPICAL

## 2015-07-26 MED ORDER — DEXAMETHASONE SODIUM PHOSPHATE 10 MG/ML IJ SOLN
10.0000 mg | INTRAMUSCULAR | Status: AC
  Administered 2015-07-26: 10 mg via INTRAVENOUS

## 2015-07-26 MED ORDER — FENTANYL CITRATE (PF) 100 MCG/2ML IJ SOLN: 25.0000 ug | INTRAMUSCULAR | Status: DC | PRN

## 2015-07-26 MED ORDER — VECURONIUM BROMIDE 10 MG IV SOLR
INTRAVENOUS | Status: DC | PRN
  Administered 2015-07-26: 2 mg via INTRAVENOUS
  Administered 2015-07-26: 4 mg via INTRAVENOUS

## 2015-07-26 MED ORDER — BACITRACIN 50000 UNITS IM SOLR
INTRAMUSCULAR | Status: DC | PRN
  Administered 2015-07-26: 500 mL

## 2015-07-26 MED ORDER — HYDROCODONE-ACETAMINOPHEN 5-325 MG PO TABS
1.0000 | ORAL_TABLET | ORAL | Status: DC | PRN
  Administered 2015-07-26: 2 via ORAL
  Administered 2015-07-27: 1 via ORAL
  Administered 2015-07-27: 2 via ORAL
  Filled 2015-07-26: qty 1
  Filled 2015-07-26 (×2): qty 2

## 2015-07-26 MED ORDER — SUCCINYLCHOLINE CHLORIDE 20 MG/ML IJ SOLN
INTRAMUSCULAR | Status: DC | PRN
  Administered 2015-07-26: 100 mg via INTRAVENOUS

## 2015-07-26 MED ORDER — ACETAMINOPHEN 650 MG RE SUPP
650.0000 mg | RECTAL | Status: DC | PRN
Start: 2015-07-26 — End: 2015-07-27

## 2015-07-26 MED ORDER — PROPOFOL 10 MG/ML IV BOLUS
INTRAVENOUS | Status: DC | PRN
  Administered 2015-07-26: 150 mg via INTRAVENOUS

## 2015-07-26 MED ORDER — ONDANSETRON HCL 4 MG/2ML IJ SOLN: 4.0000 mg | Freq: Once | INTRAMUSCULAR | Status: AC | PRN

## 2015-07-26 MED ORDER — SODIUM CHLORIDE 0.9 % IJ SOLN: 3.0000 mL | INTRAMUSCULAR | Status: DC | PRN

## 2015-07-26 MED ORDER — NEOSTIGMINE METHYLSULFATE 10 MG/10ML IV SOLN
INTRAVENOUS | Status: DC | PRN
  Administered 2015-07-26: 4 mg via INTRAVENOUS

## 2015-07-26 MED ORDER — ONDANSETRON HCL 4 MG/2ML IJ SOLN
INTRAMUSCULAR | Status: AC
  Filled 2015-07-26: qty 2

## 2015-07-26 MED ORDER — DEXAMETHASONE 4 MG PO TABS
4.0000 mg | ORAL_TABLET | Freq: Four times a day (QID) | ORAL | Status: DC
  Administered 2015-07-26 – 2015-07-27 (×3): 4 mg via ORAL
  Filled 2015-07-26 (×3): qty 1

## 2015-07-26 MED ORDER — GLYCOPYRROLATE 0.2 MG/ML IJ SOLN
INTRAMUSCULAR | Status: AC
  Filled 2015-07-26: qty 3

## 2015-07-26 MED ORDER — LACTATED RINGERS IV SOLN
INTRAVENOUS | Status: DC
  Administered 2015-07-26: 13:00:00 via INTRAVENOUS

## 2015-07-26 MED ORDER — NYSTATIN 100000 UNIT/ML MT SUSP
5.0000 mL | Freq: Four times a day (QID) | OROMUCOSAL | Status: DC
  Administered 2015-07-26: 500000 [IU] via ORAL
  Filled 2015-07-26 (×6): qty 5

## 2015-07-26 MED ORDER — ROCURONIUM BROMIDE 50 MG/5ML IV SOLN
INTRAVENOUS | Status: AC
  Filled 2015-07-26: qty 1

## 2015-07-26 MED ORDER — MIDAZOLAM HCL 5 MG/5ML IJ SOLN
INTRAMUSCULAR | Status: DC | PRN
  Administered 2015-07-26: 2 mg via INTRAVENOUS

## 2015-07-26 MED ORDER — LACTATED RINGERS IV SOLN
INTRAVENOUS | Status: DC | PRN
  Administered 2015-07-26 (×2): via INTRAVENOUS

## 2015-07-26 MED ORDER — 0.9 % SODIUM CHLORIDE (POUR BTL) OPTIME
TOPICAL | Status: DC | PRN
  Administered 2015-07-26: 1000 mL

## 2015-07-26 MED ORDER — DEXAMETHASONE SODIUM PHOSPHATE 4 MG/ML IJ SOLN: 4.0000 mg | Freq: Four times a day (QID) | INTRAMUSCULAR | Status: DC

## 2015-07-26 MED ORDER — NEOSTIGMINE METHYLSULFATE 10 MG/10ML IV SOLN
INTRAVENOUS | Status: AC
  Filled 2015-07-26: qty 1

## 2015-07-26 MED ORDER — VECURONIUM BROMIDE 10 MG IV SOLR
INTRAVENOUS | Status: AC
  Filled 2015-07-26: qty 10

## 2015-07-26 MED ORDER — MUPIROCIN 2 % EX OINT
1.0000 "application " | TOPICAL_OINTMENT | Freq: Once | CUTANEOUS | Status: AC
  Administered 2015-07-26: 1 via TOPICAL

## 2015-07-26 MED ORDER — DEXAMETHASONE SODIUM PHOSPHATE 10 MG/ML IJ SOLN
INTRAMUSCULAR | Status: AC
  Filled 2015-07-26: qty 1

## 2015-07-26 MED ORDER — OXYCODONE HCL 5 MG/5ML PO SOLN: 5.0000 mg | Freq: Once | ORAL | Status: AC | PRN

## 2015-07-26 MED ORDER — CEFAZOLIN SODIUM-DEXTROSE 2-3 GM-% IV SOLR
INTRAVENOUS | Status: AC
  Filled 2015-07-26: qty 50

## 2015-07-26 MED ORDER — SODIUM CHLORIDE 0.9 % IV SOLN: 250.0000 mL | INTRAVENOUS | Status: DC

## 2015-07-26 MED ORDER — SODIUM CHLORIDE 0.9 % IJ SOLN
3.0000 mL | Freq: Two times a day (BID) | INTRAMUSCULAR | Status: DC
  Administered 2015-07-26: 3 mL via INTRAVENOUS

## 2015-07-26 MED ORDER — ACETAMINOPHEN 10 MG/ML IV SOLN
INTRAVENOUS | Status: AC
  Administered 2015-07-26: 1000 mg via INTRAVENOUS
  Filled 2015-07-26: qty 100

## 2015-07-26 SURGICAL SUPPLY — 53 items
APL SKNCLS STERI-STRIP NONHPOA (GAUZE/BANDAGES/DRESSINGS) ×1
BAG DECANTER FOR FLEXI CONT (MISCELLANEOUS) ×2 IMPLANT
BENZOIN TINCTURE PRP APPL 2/3 (GAUZE/BANDAGES/DRESSINGS) ×2 IMPLANT
BONE MATRIX OSTEOCEL PRO SM (Bone Implant) ×2 IMPLANT
BUR MATCHSTICK NEURO 3.0 LAGG (BURR) ×2 IMPLANT
CANISTER SUCT 3000ML PPV (MISCELLANEOUS) ×2 IMPLANT
CAP END 15X13 MONOLITH PARA (Neuro Prosthesis/Implant) IMPLANT
CONT SPEC 4OZ CLIKSEAL STRL BL (MISCELLANEOUS) ×2 IMPLANT
CORE IMPLANT MONOLITH 12X19MM (Neuro Prosthesis/Implant) ×1 IMPLANT
DRAPE C-ARM 42X72 X-RAY (DRAPES) ×4 IMPLANT
DRAPE LAPAROTOMY 100X72 PEDS (DRAPES) ×2 IMPLANT
DRAPE MICROSCOPE LEICA (MISCELLANEOUS) ×2 IMPLANT
DRAPE POUCH INSTRU U-SHP 10X18 (DRAPES) ×2 IMPLANT
DRILL BIT HELIX (BIT) ×1 IMPLANT
DRSG OPSITE 4X5.5 SM (GAUZE/BANDAGES/DRESSINGS) ×1 IMPLANT
DRSG OPSITE POSTOP 4X6 (GAUZE/BANDAGES/DRESSINGS) ×1 IMPLANT
DRSG TELFA 3X8 NADH (GAUZE/BANDAGES/DRESSINGS) IMPLANT
DURAPREP 6ML APPLICATOR 50/CS (WOUND CARE) ×2 IMPLANT
ELECT COATED BLADE 2.86 ST (ELECTRODE) ×2 IMPLANT
ELECT REM PT RETURN 9FT ADLT (ELECTROSURGICAL) ×2
ELECTRODE REM PT RTRN 9FT ADLT (ELECTROSURGICAL) ×1 IMPLANT
ENDCAP MONOLITH LORD 17X14MM (Neuro Prosthesis/Implant) ×1 IMPLANT
ENDCAP MONOLITH PARA 15X13MM (Neuro Prosthesis/Implant) ×1 IMPLANT
GAUZE SPONGE 4X4 16PLY XRAY LF (GAUZE/BANDAGES/DRESSINGS) IMPLANT
GLOVE BIO SURGEON STRL SZ8 (GLOVE) ×2 IMPLANT
GOWN STRL REUS W/ TWL LRG LVL3 (GOWN DISPOSABLE) IMPLANT
GOWN STRL REUS W/ TWL XL LVL3 (GOWN DISPOSABLE) ×1 IMPLANT
GOWN STRL REUS W/TWL 2XL LVL3 (GOWN DISPOSABLE) IMPLANT
GOWN STRL REUS W/TWL LRG LVL3 (GOWN DISPOSABLE) ×6
GOWN STRL REUS W/TWL XL LVL3 (GOWN DISPOSABLE) ×4
HEMOSTAT POWDER KIT SURGIFOAM (HEMOSTASIS) ×3 IMPLANT
KIT BASIN OR (CUSTOM PROCEDURE TRAY) ×2 IMPLANT
KIT ROOM TURNOVER OR (KITS) ×2 IMPLANT
NDL HYPO 25X1 1.5 SAFETY (NEEDLE) ×1 IMPLANT
NDL SPNL 20GX3.5 QUINCKE YW (NEEDLE) ×1 IMPLANT
NEEDLE HYPO 25X1 1.5 SAFETY (NEEDLE) ×2 IMPLANT
NEEDLE SPNL 20GX3.5 QUINCKE YW (NEEDLE) ×2 IMPLANT
NS IRRIG 1000ML POUR BTL (IV SOLUTION) ×2 IMPLANT
PACK LAMINECTOMY NEURO (CUSTOM PROCEDURE TRAY) ×2 IMPLANT
PAD ARMBOARD 7.5X6 YLW CONV (MISCELLANEOUS) ×6 IMPLANT
PAD DRESSING TELFA 3X8 NADH (GAUZE/BANDAGES/DRESSINGS) ×1 IMPLANT
PLATE ARCHON 42MM 2LVL (Plate) ×1 IMPLANT
RUBBERBAND STERILE (MISCELLANEOUS) ×4 IMPLANT
SCREW ARCHON SELFTAP 4.0X15MM (Screw) ×4 IMPLANT
SPONGE INTESTINAL PEANUT (DISPOSABLE) ×2 IMPLANT
SPONGE SURGIFOAM ABS GEL 100 (HEMOSTASIS) ×2 IMPLANT
STRIP CLOSURE SKIN 1/2X4 (GAUZE/BANDAGES/DRESSINGS) ×3 IMPLANT
SUT VIC AB 3-0 SH 8-18 (SUTURE) ×4 IMPLANT
SYR 20ML ECCENTRIC (SYRINGE) ×1 IMPLANT
TOWEL OR 17X24 6PK STRL BLUE (TOWEL DISPOSABLE) ×2 IMPLANT
TOWEL OR 17X26 10 PK STRL BLUE (TOWEL DISPOSABLE) ×2 IMPLANT
TRAP SPECIMEN MUCOUS 40CC (MISCELLANEOUS) IMPLANT
WATER STERILE IRR 1000ML POUR (IV SOLUTION) ×2 IMPLANT

## 2015-07-26 NOTE — H&P (Signed)
Subjective:   Patient is a 69 y.o. male admitted for C5 tumor. The patient first presented to me with complaints of neck pain and numbness of the arm(s). Onset of symptoms was several weeks ago. The pain is described as dull and occurs intermittently. The pain is rated mild, and is located  In the neck and radiates to the arms. The symptoms have been better on decadron. Symptoms are exacerbated by none, and are relieved by none.  Previous work up includes MRI of cervical spine, results: tumor in C5 bilateral.  Past Medical History  Diagnosis Date  . History of bleeding ulcers 1990's  . 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 Anxiety   . GERD (gastroesophageal reflux disease)   . H. pylori infection   . History of blood transfusion   . Bone cancer      tumor on C5    Past Surgical History  Procedure Laterality Date  . Hernia repair Bilateral     with mesh  . Colonoscopy    . Eye surgery Right     catheter    No Known Allergies  History  Substance Use Topics  . Smoking status: Former Smoker -- 1.00 packs/day for 10 years    Types: Cigarettes    Quit date: 12/23/1981  . Smokeless tobacco: Never Used  . Alcohol Use: 3.0 oz/week    2 Glasses of wine, 3 Cans of beer per week    Family History  Problem Relation Age of Onset  . Hypertension Mother    Prior to Admission medications   Medication Sig Start Date End Date Taking? Authorizing Provider  alum & mag hydroxide-simeth (MAALOX/MYLANTA) 200-200-20 MG/5ML suspension Take 15-30 mLs by mouth every 6 (six) hours as needed for indigestion or heartburn.   Yes Historical Provider, MD  dexamethasone (DECADRON) 4 MG tablet Take 1 tablet (4 mg total) by mouth 3 (three) times daily. 06/21/15  Yes Evelina Bucy, MD  diazepam (VALIUM) 5 MG tablet Take 5 mg by mouth every 6 (six) hours as needed for anxiety (anxiety).   Yes Historical Provider, MD  nystatin (MYCOSTATIN) 100000 UNIT/ML suspension Take 5 mLs (500,000 Units total) by mouth 4 (four)  times daily. 07/24/15 08/07/15 Yes Gautam Juleen China, MD  omeprazole (PRILOSEC) 40 MG capsule Take 1 capsule (40 mg total) by mouth daily. 07/24/15  Yes Brunetta Genera, MD  simvastatin (ZOCOR) 20 MG tablet Take 20 mg by mouth daily.   Yes Historical Provider, MD  temazepam (RESTORIL) 15 MG capsule Take 15 mg by mouth at bedtime as needed for sleep (sleep).   Yes Historical Provider, MD  traMADol (ULTRAM) 50 MG tablet Take 50-100 mg by mouth every 6 (six) hours as needed for moderate pain or severe pain (pain).   Yes Historical Provider, MD     Review of Systems  Positive ROS: neg  All other systems have been reviewed and were otherwise negative with the exception of those mentioned in the HPI and as above.  Objective: Vital signs in last 24 hours: Temp:  [98.2 F (36.8 C)] 98.2 F (36.8 C) (08/03 1135) Pulse Rate:  [67] 67 (08/03 1135) Resp:  [20] 20 (08/03 1135) BP: (137)/(78) 137/78 mmHg (08/03 1135) SpO2:  [99 %] 99 % (08/03 1135) Weight:  [211 lb (95.709 kg)] 211 lb (95.709 kg) (08/03 1135)  General Appearance: Alert, cooperative, no distress, appears stated age Head: Normocephalic, without obvious abnormality, atraumatic Eyes: PERRL, conjunctiva/corneas clear, EOM's intact      Neck: Supple,  symmetrical, trachea midline, Back: Symmetric, no curvature, ROM normal, no CVA tenderness Lungs:  respirations unlabored Heart: Regular rate and rhythm Abdomen: Soft, non-tender Extremities: Extremities normal, atraumatic, no cyanosis or edema Pulses: 2+ and symmetric all extremities Skin: Skin color, texture, turgor normal, no rashes or lesions  NEUROLOGIC:  Mental status: Alert and oriented x4, no aphasia, good attention span, fund of knowledge and memory  Motor Exam - grossly normal Sensory Exam - grossly normal Reflexes: 1+ Coordination - grossly normal Gait - grossly normal Balance - grossly normal Cranial Nerves: I: smell Not tested  II: visual acuity  OS: nl    OD: nl   II: visual fields Full to confrontation  II: pupils Equal, round, reactive to light  III,VII: ptosis None  III,IV,VI: extraocular muscles  Full ROM  V: mastication Normal  V: facial light touch sensation  Normal  V,VII: corneal reflex  Present  VII: facial muscle function - upper  Normal  VII: facial muscle function - lower Normal  VIII: hearing Not tested  IX: soft palate elevation  Normal  IX,X: gag reflex Present  XI: trapezius strength  5/5  XI: sternocleidomastoid strength 5/5  XI: neck flexion strength  5/5  XII: tongue strength  Normal    Data Review Lab Results  Component Value Date   WBC 12.3* 07/24/2015   HGB 14.9 07/24/2015   HCT 44.7 07/24/2015   MCV 88.9 07/24/2015   PLT 160 07/24/2015   Lab Results  Component Value Date   NA 142 07/24/2015   K 4.0 07/24/2015   CL 104 06/21/2015   CO2 29 07/24/2015   BUN 27.7* 07/24/2015   CREATININE 0.9 07/24/2015   GLUCOSE 130 07/24/2015   Lab Results  Component Value Date   INR 0.98 07/04/2015    Assessment:   Cervical neck pain with herniated nucleus pulposus/ spondylosis/ stenosis with bony tumor involvement C5 . Patient has failed conservative therapy. Planned surgery : C5 corpectomy  Plan:   I explained the condition and procedure to the patient and answered any questions.  Patient wishes to proceed with procedure as planned. Understands risks/ benefits/ and expected or typical outcomes.  Chiquitta Matty S 07/26/2015 1:30 PM

## 2015-07-26 NOTE — Progress Notes (Signed)
Pt arrived to 4N28 via stretcher.  Pt ambulated to bed.  No c/o pain or discomfort.  Cervical collar on and aligned.  Diet placed.  Will continue to monitor. Cori Razor, RN

## 2015-07-26 NOTE — Plan of Care (Signed)
Problem: Consults Goal: Diagnosis - Spinal Surgery Microdiscectomy

## 2015-07-26 NOTE — Anesthesia Preprocedure Evaluation (Addendum)
Anesthesia Evaluation  Patient identified by MRN, date of birth, ID band Patient awake    Reviewed: Allergy & Precautions, H&P , NPO status , Patient's Chart, lab work & pertinent test results  History of Anesthesia Complications Negative for: history of anesthetic complications  Airway Mallampati: II  TM Distance: >3 FB Neck ROM: Limited    Dental no notable dental hx. (+) Teeth Intact, Chipped, Dental Advisory Given,    Pulmonary former smoker,  breath sounds clear to auscultation  Pulmonary exam normal       Cardiovascular negative cardio ROS Normal cardiovascular examRhythm:regular Rate:Normal     Neuro/Psych negative neurological ROS  negative psych ROS   GI/Hepatic Neg liver ROS, GERD-  Medicated and Controlled,  Endo/Other  negative endocrine ROS  Renal/GU negative Renal ROS     Musculoskeletal   Abdominal   Peds  Hematology negative hematology ROS (+)   Anesthesia Other Findings C collar in place  Reproductive/Obstetrics negative OB ROS                           Anesthesia Physical Anesthesia Plan  ASA: III  Anesthesia Plan: General   Post-op Pain Management:    Induction: Intravenous  Airway Management Planned: Video Laryngoscope Planned  Additional Equipment:   Intra-op Plan:   Post-operative Plan: Extubation in OR  Informed Consent: I have reviewed the patients History and Physical, chart, labs and discussed the procedure including the risks, benefits and alternatives for the proposed anesthesia with the patient or authorized representative who has indicated his/her understanding and acceptance.     Plan Discussed with: Anesthesiologist and CRNA  Anesthesia Plan Comments: (Given cord compression and wearing C collar will use LoPro Glidescope to decrease risk of injury with neck extension, LTA, will give 10mg  dexamethasone as this is his currently scheduled  medication)       Anesthesia Quick Evaluation

## 2015-07-26 NOTE — Op Note (Signed)
07/26/2015  4:00 PM  PATIENT:  Ethan Stewart  69 y.o. 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 C5 corpectomy utilizing microscopic dissection to remove epidural tumor from the surface of the dura, 2. C4-C6 arthrodesis utilizing a peek interbody cage packed with morselized allograft, 3. Anterior cervical plating C4-C6 utilizing a nuvasive archon plate  SURGEON:  Sherley Bounds, MD  ASSISTANTS: Dr. Cyndy Freeze  ANESTHESIA:   General  EBL: 200 ml  Total I/O In: 1500 [I.V.:1500] Out: 200 [Blood:200]  BLOOD ADMINISTERED:none  DRAINS: None   SPECIMEN:  Excision  INDICATION FOR PROCEDURE: This patient presented with neck and arm pain. MRI showed that his C5 vertebral body had been replaced by a mass with extension into the epidural space. We recommended C5 corpectomy for decompression of the canal and to obtain pathologic diagnosis. Patient understood the risks, benefits, and alternatives and potential outcomes and wished to proceed.  PROCEDURE DETAILS: Patient was brought to the operating room placed under general endotracheal anesthesia. Patient was placed in the supine position on the operating room table. The neck was prepped with Duraprep and draped in a sterile fashion.   Three cc of local anesthesia was injected and a transverse incision was made on the right side of the neck.  Dissection was carried down thru the subcutaneous tissue and the platysma was  elevated, opened, and undermined with Metzenbaum scissors.  Dissection was then carried out thru an avascular plane leaving the sternocleidomastoid carotid artery and jugular vein laterally and the trachea and esophagus medially. The ventral aspect of the vertebral column was identified and a localizing x-ray was taken. The C5-6 level was identified. The longus colli muscles  were then elevated and the retractor was placed to expose C4-C6. The annulus of C4-5 and C5-6 was incised and the disc space entered. Discectomy was performed with micro-curettes and pituitary rongeurs and the superior endplate of C6 and inferior endplate of C4 were scraped utilizing curettes.. I then used the high-speed drill to drill the endplates down to the level of the posterior longitudinal ligament. I then removed the remainder of the C5 vertebral body with a Leksell rongeur. This exposed the posterior longitudinal ligament.  The operating microscope was draped and brought into the field provided additional magnification, illumination and visualization. Posterior longitudinal ligament was opened with a nerve hook, and then removed along with disc herniation and osteophytes and remaining epidural tumor, decompressing the spinal canal and thecal sac. We then continued to remove osteophytic overgrowth and disc material decompressing the neural foramina and exiting nerve roots bilaterally. The scope was angled up and down to help decompress and undercut the vertebral bodies. Once the decompression was completed we could pass a nerve hook circumferentially to assure adequate decompression in the midline and in the neural foramina. So by both visualization and palpation we felt we had an adequate decompression of the neural elements. Small amounts of tumor and lateral vertebral body were left behind laterally in order to avoid the vertebral arteries. We then measured the height of the intravertebral space and selected a 26 mm millimeter Peek interbody cage packed morcellized allograft. The cage was probed with a 12 mm core and a 0 superior and in a 7 inferior into match the intravertebral space. It was then gently positioned in the intravertebral disc space and countersunk. It seemed to have a nice  tight fit.  I then used a 44 mm plate and placed four fixed angle screws into the vertebral bodies of C4 and C6 and  locked them into position. The wound was irrigated with bacitracin solution, checked for hemostasis which was established and confirmed. Once meticulous hemostasis was achieved, we then proceeded with closure. The platysma was closed with interrupted 3-0 undyed Vicryl suture, the subcuticular layer was closed with interrupted 3-0 undyed Vicryl suture. The skin edges were approximated with steristrips. The drapes were removed. A sterile dressing was applied. The patient was then awakened from general anesthesia and transferred to the recovery room in stable condition. At the end of the procedure all sponge, needle and instrument counts were correct.   PLAN OF CARE: Admit for overnight observation  PATIENT DISPOSITION:  PACU - hemodynamically stable.   Delay start of Pharmacological VTE agent (>24hrs) due to surgical blood loss or risk of bleeding:  yes

## 2015-07-26 NOTE — Anesthesia Procedure Notes (Signed)
Procedure Name: Intubation Date/Time: 07/26/2015 1:48 PM Performed by: ,  K Pre-anesthesia Checklist: Patient identified, Emergency Drugs available, Suction available, Patient being monitored and Timeout performed Patient Re-evaluated:Patient Re-evaluated prior to inductionOxygen Delivery Method: Circle system utilized Preoxygenation: Pre-oxygenation with 100% oxygen Intubation Type: IV induction Ventilation: Mask ventilation without difficulty Grade View: Grade II Tube type: Oral Tube size: 7.5 mm Number of attempts: 1 Airway Equipment and Method: Stylet and LTA kit utilized Placement Confirmation: positive ETCO2,  CO2 detector and breath sounds checked- equal and bilateral Secured at: 22 cm Tube secured with: Tape Dental Injury: Teeth and Oropharynx as per pre-operative assessment  Difficulty Due To: Difficulty was anticipated, Difficult Airway- due to cervical collar and Difficult Airway- due to anterior larynx Comments: Elective video-glide. Patient with cervical stenosis and tumor at C5. Smooth IV induction. EZ mask. DL x 1 video-glide MAC 4. Grade 2 view. LTA applied. Atraum intubation. Patient's head/neck remained in cervical collar and in neutral position throughout induction and intubation. +ETCO2 bbs=     

## 2015-07-26 NOTE — Anesthesia Postprocedure Evaluation (Signed)
  Anesthesia Post-op Note  Patient: Ethan Stewart  Procedure(s) Performed: Procedure(s) with comments: Cervical five Corpectomy/Cervical four-six Fusion/Plate  (N/A) - C5 [id number] Fusion/Plate C4-6  Patient Location: PACU  Anesthesia Type:General  Level of Consciousness: awake, alert  and oriented  Airway and Oxygen Therapy: Patient Spontanous Breathing  Post-op Pain: minimal  Post-op Assessment: Post-op Vital signs reviewed              Post-op Vital Signs: Reviewed  Last Vitals:  Filed Vitals:   07/26/15 1640  BP: 151/85  Pulse: 52  Temp:   Resp: 14    Complications: No apparent anesthesia complications

## 2015-07-26 NOTE — Progress Notes (Signed)
Ethan Stewart [facility]  HEMATOLOGY ONCOLOGY PROGRESS NOTE Date of service 07/24/2015 Patient Care Team: Lona Kettle, MD as PCP - General (Family Medicine) Brunetta Genera, MD as Consulting Physician (Hematology)  SUMMARY OF ONCOLOGIC HISTORY:   Osseous metastasis   06/21/2015 Imaging CT Chest/abd/pelvis: IMPRESSION: 5 mm nodule within the right lower lobe as described.  Pneumobilia consistent with a prior sphincterotomy.  No acute abnormality is identified. No findings to suggest chest etiology for the known lesion at C5 are seen.   06/21/2015 Imaging MRI c spine1. Diffuse marrow replacement of the C5 vertebral body highly concerning for neoplasm (metastatic disease, myeloma, or lymphoma). Epidural tumor at this level results in moderate spinal stenosis with moderate impression on the spinal cord    07/10/2015 PET scan 1. The previously demonstrated abnormal C5 vertebral body is hypermetabolic. No other osseous lesions demonstrated. 2. There are small hypermetabolic lymph nodes within the left mesentery and both inguinal regions.     INTERVAL HISTORY:  Ethan Stewart is here for his scheduled followup.  He has been seen by Dr. Ronnald Ramp with neurosurgery since his last clinic visit and had a repeat MRI of the C-spine.  He notes that he has been scheduled for surgery on 07/26/2015 for removal of his C5 tumor and stabilization of the C-spine.  He continues to be an high-dose steroids dexamethasone 4 mg 3 times a day and is keen to wean off from this. He notes some fatigue and loss of muscle mass.  No acute new focal neurological deficits.  Has been appropriately using his cervical collar.  No issues with bowel or bladder incontinence or retention.  Notes several pound weight loss and decreased appetite with a change in taste.  He was noted to have significant oral thrush likely related to steroids for which he was prescribed medications. He is keen to have this taken care of and to know the diagnosis.  REVIEW OF SYSTEMS:     Constitutional: Denies fevers, chills or abnormal weight loss Eyes: Denies blurriness of vision Ears, nose, mouth, throat, and face: Denies mucositis or sore throat Respiratory: Denies cough, dyspnea or wheezes Cardiovascular: Denies palpitation, chest discomfort or lower extremity swelling Gastrointestinal:  Denies nausea, heartburn or change in bowel habits Skin: Denies abnormal skin rashes Lymphatics: Denies new lymphadenopathy or easy bruising Neurological:Denies numbness, tingling or new weaknesses Behavioral/Psych: Mood is stable, no new changes  All other systems were reviewed with the patient and are negative.  I have reviewed the past medical history, past surgical history, social history and family history with the patient and they are unchanged from previous note.  ALLERGIES:  has No Known Allergies.  MEDICATIONS:  Current Outpatient Prescriptions  Medication Sig Dispense Refill  . alum & mag hydroxide-simeth (MAALOX/MYLANTA) 200-200-20 MG/5ML suspension Take 15-30 mLs by mouth every 6 (six) hours as needed for indigestion or heartburn.    . dexamethasone (DECADRON) 4 MG tablet Take 1 tablet (4 mg total) by mouth 3 (three) times daily. 90 tablet 0  . simvastatin (ZOCOR) 20 MG tablet Take 20 mg by mouth daily.    . diazepam (VALIUM) 5 MG tablet Take 5 mg by mouth every 6 (six) hours as needed for anxiety (anxiety).    . nystatin (MYCOSTATIN) 100000 UNIT/ML suspension Take 5 mLs (500,000 Units total) by mouth 4 (four) times daily. 280 mL 0  . omeprazole (PRILOSEC) 40 MG capsule Take 1 capsule (40 mg total) by mouth daily. 30 capsule 0  . temazepam (RESTORIL) 15 MG capsule Take 15  mg by mouth at bedtime as needed for sleep (sleep).    . traMADol (ULTRAM) 50 MG tablet Take 50-100 mg by mouth every 6 (six) hours as needed for moderate pain or severe pain (pain).     No current facility-administered medications for this visit.    PHYSICAL EXAMINATION: ECOG PERFORMANCE STATUS: 1  - Symptomatic but completely ambulatory  Filed Vitals:   07/24/15 1237  BP: 158/95  Pulse: 87  Temp: 98.6 F (37 C)  Resp: 18   Filed Weights   07/24/15 1237  Weight: 211 lb 9.6 oz (95.981 kg)    GENERAL:alert, no distress and comfortable SKIN: skin color, texture, turgor are normal, no rashes or significant lesions EYES: normal, Conjunctiva are pink and non-injected, sclera clear OROPHARYNX:no exudate, no erythema and lips, buccal mucosa, and tongue normal  NECK: supple, thyroid normal size, non-tender, without nodularity LYMPH: small palpable left inguinal adenopathy. LUNGS: clear to auscultation and percussion with normal breathing effort HEART: regular rate & rhythm and no murmurs and no lower extremity edema ABDOMEN:abdomen soft, non-tender and normal bowel sounds Musculoskeletal:no cyanosis of digits and no clubbing  NEURO: alert & oriented x 3 with fluent speech, no focal motor/sensory deficits  LABORATORY DATA:  I have reviewed the data as listed    Component Value Date/Time   NA 142 07/24/2015 1222   NA 139 06/21/2015 1709   K 4.0 07/24/2015 1222   K 4.1 06/21/2015 1709   CL 104 06/21/2015 1709   CO2 29 07/24/2015 1222   CO2 26 06/21/2015 1709   GLUCOSE 130 07/24/2015 1222   GLUCOSE 156* 06/21/2015 1709   BUN 27.7* 07/24/2015 1222   BUN 18 06/21/2015 1709   CREATININE 0.9 07/24/2015 1222   CREATININE 0.99 06/21/2015 1709   CALCIUM 9.0 07/24/2015 1222   CALCIUM 9.2 06/21/2015 1709   PROT 6.0* 07/24/2015 1222   ALBUMIN 3.2* 07/24/2015 1222   AST 33 07/24/2015 1222   ALT 74* 07/24/2015 1222   ALKPHOS 60 07/24/2015 1222   BILITOT 0.65 07/24/2015 1222   GFRNONAA >60 06/21/2015 1709   GFRAA >60 06/21/2015 1709    No results found for: SPEP, UPEP  Lab Results  Component Value Date   WBC 12.3* 07/24/2015   NEUTROABS 9.2* 07/24/2015   HGB 14.9 07/24/2015   HCT 44.7 07/24/2015   MCV 88.9 07/24/2015   PLT 160 07/24/2015      Chemistry      Component  Value Date/Time   NA 142 07/24/2015 1222   NA 139 06/21/2015 1709   K 4.0 07/24/2015 1222   K 4.1 06/21/2015 1709   CL 104 06/21/2015 1709   CO2 29 07/24/2015 1222   CO2 26 06/21/2015 1709   BUN 27.7* 07/24/2015 1222   BUN 18 06/21/2015 1709   CREATININE 0.9 07/24/2015 1222   CREATININE 0.99 06/21/2015 1709      Component Value Date/Time   CALCIUM 9.0 07/24/2015 1222   CALCIUM 9.2 06/21/2015 1709   ALKPHOS 60 07/24/2015 1222   AST 33 07/24/2015 1222   ALT 74* 07/24/2015 1222   BILITOT 0.65 07/24/2015 1222       RADIOGRAPHIC STUDIES: I have personally reviewed the radiological images as listed and agreed with the findings in the report. No results found.   ASSESSMENT & PLAN:   1) Osseous metastasis/ Cervical spine tumor Patient presented with neck pain for 4-6 weeks with evidence of some proximal muscle weakness and bilateral shoulder pains and left upper extremity discomfort. No gait  instability. No bowel or bladder dysfunction. MRI of the C-spine showed C5 bone marrow replacement concerning for neoplasm with epidural component and mild cord compression.  The differential diagnosis at this point is broad including metastatic malignancy/lymphoma. Based on his normal Lambda free light chains and negative M spike on SPEP, And normal sedimentation rate multiple myeloma is less likely.  PET CT scan today shows significantly FDG avid C5 mass and couple of small FDG avid inguinal lymph nodes and possibly some mesenteric lymph nodes. Overall picture concerning for lymphoma. Normal LDH might make an indolent lymphoma a possibility.  Patient's neck symptoms are relatively stable with no overt new progressive symptoms of cord compression on examination today. 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 Plan -patient is following with Dr. Ronnald Ramp from neurosurgery and has been scheduled for his C-spine surgery on 07/26/2015. -Would recommend sending the tumor for surgical pathology as well  as for lymphoma workup -His surgery appointment for lymph node biopsy has been postponed at this time. -Would recommend early tapering down off his steroids due to concern of steroid myopathy. -Return to care with medical oncology Dr. Irene Limbo in 7-10 days after surgery to determine further treatment planning based on pathology results. The patient has my card and clinic number to call if any acute concerns arise.  2) Oropharyngeal thrush likely related to high-dose steroid use. -Tapered off steroids as possible after surgery -Given nystatin swish and swallow for 14 days  3] acid reflux and gastritis likely related to steroid use -PPI prescribed especially given his history of remote bleeding gastric ulcers  I spent 30 minutes counseling the patient face to face. The total time spent in the appointment was 35 minutes and more than 50% was on counseling and review of test results     Sullivan Lone, MD  Hematology oncology physician [place]

## 2015-07-26 NOTE — Transfer of Care (Signed)
Immediate Anesthesia Transfer of Care Note  Patient: Ethan Stewart  Procedure(s) Performed: Procedure(s) with comments: Cervical five Corpectomy/Cervical four-six Fusion/Plate  (N/A) - C5 [id number] Fusion/Plate C4-6  Patient Location: PACU  Anesthesia Type:General  Level of Consciousness: awake, alert , oriented and patient cooperative  Airway & Oxygen Therapy: Patient Spontanous Breathing and Patient connected to nasal cannula oxygen  Post-op Assessment: Report given to RN and Post -op Vital signs reviewed and stable  Post vital signs: Reviewed  Last Vitals:  Filed Vitals:   07/26/15 1135  BP: 137/78  Pulse: 67  Temp: 36.8 C  Resp: 20    Complications: No apparent anesthesia complications

## 2015-07-27 ENCOUNTER — Encounter (HOSPITAL_COMMUNITY): Payer: Self-pay | Admitting: Neurological Surgery

## 2015-07-27 DIAGNOSIS — C8339 Diffuse large B-cell lymphoma, extranodal and solid organ sites: Secondary | ICD-10-CM | POA: Diagnosis not present

## 2015-07-27 MED ORDER — HYDROCODONE-ACETAMINOPHEN 5-325 MG PO TABS: 1.0000 | ORAL_TABLET | ORAL | Status: DC | PRN

## 2015-07-27 MED ORDER — DEXAMETHASONE 4 MG PO TABS: 2.0000 mg | ORAL_TABLET | Freq: Three times a day (TID) | ORAL | Status: DC

## 2015-07-27 NOTE — Progress Notes (Signed)
Discharge instructions reviewed with patient/family. RXs given. All questions answered at this time. Transport home by family.   Ave Filter, RN

## 2015-07-27 NOTE — Discharge Summary (Signed)
Physician Discharge Summary  Patient ID: Ethan Stewart MRN: [id number] DOB/AGE: 1946-04-30 69 y.o.  Admit date: 07/26/2015 Discharge date: 07/27/2015  Admission Diagnoses: cervical tumor    Discharge Diagnoses: same   Discharged Condition: good  Hospital Course: The patient was admitted on 07/26/2015 and taken to the operating room where the patient underwent C5 corpectomy. The patient tolerated the procedure well and was taken to the recovery room and then to the floor in stable condition. The hospital course was routine. There were no complications. The wound remained clean dry and intact. Pt had appropriate neck soreness. No complaints of arm pain or new N/T/W. The patient remained afebrile with stable vital signs, and tolerated a regular diet. The patient continued to increase activities, and pain was well controlled with oral pain medications.   Consults: None  Significant Diagnostic Studies:  Results for orders placed or performed during the hospital encounter of 07/26/15  Surgical pcr screen  Result Value Ref Range   MRSA, PCR NEGATIVE NEGATIVE   Staphylococcus aureus NEGATIVE NEGATIVE  Type and screen  Result Value Ref Range   ABO/RH(D) B NEG    Antibody Screen NEG    Sample Expiration 07/29/2015    Unit Number [id number]    Blood Component Type RBC LR PHER1    Unit division 00    Status of Unit ALLOCATED    Transfusion Status OK TO TRANSFUSE    Crossmatch Result Compatible    Unit Number [id number]    Blood Component Type RED CELLS,LR    Unit division 00    Status of Unit ALLOCATED    Transfusion Status OK TO TRANSFUSE    Crossmatch Result Compatible   Prepare RBC  Result Value Ref Range   Order Confirmation ORDER PROCESSED BY BLOOD BANK   ABO/Rh  Result Value Ref Range   ABO/RH(D) B NEG     Dg Cervical Spine 2-3 Views  07/26/2015   CLINICAL DATA:  Cervical spine fusion.  EXAM: DG C-ARM 61-120 MIN; CERVICAL SPINE - 2-3 VIEW  COMPARISON:  MRI 07/20/2015.   FINDINGS: C4 through C6 anterior interbody fusion. Hardware intact. Anatomic alignment. Two images. Fluoroscopic time 0 min 9 seconds.  IMPRESSION: C4 through C6 anterior and interbody fusion.   Electronically Signed   By: Marcello Moores  Register   On: 07/26/2015 15:57   Nm Pet Image Initial (pi) Skull Base To Thigh  07/10/2015   CLINICAL DATA:  Initial treatment strategy for C5 metastasis of unknown primary.  EXAM: NUCLEAR MEDICINE PET SKULL BASE TO THIGH  TECHNIQUE: 10.76 mCi F-18 FDG was injected intravenously. Full-ring PET imaging was performed from the skull base to thigh after the radiotracer. CT data was obtained and used for attenuation correction and anatomic localization.  FASTING BLOOD GLUCOSE:  Value: 89 mg/dl  COMPARISON:  CTs of the chest, abdomen and pelvis 06/21/2015. Cervical spine MRI 06/21/2015.  FINDINGS: NECK  No hypermetabolic cervical lymph nodes are identified.There are no lesions of the pharyngeal mucosal space. There is abnormal activity within the C5 vertebral body, described under the skeletal section below.  CHEST  There are no hypermetabolic mediastinal, hilar or axillary lymph nodes. There is no abnormal pulmonary activity. 5 mm right lower lobe nodule on image 44 is unchanged.  ABDOMEN/PELVIS  There is no hypermetabolic activity within the liver, adrenal glands, spleen or pancreas. There is a hypermetabolic left mesenteric lymph node, measuring 1.8 x 1.6 cm on image 147. This has an SUV max of 6.7. There are small hypermetabolic  inguinal lymph nodes bilaterally with an SUV max of 4.3 on the right and 6.2 on the left. No other hypermetabolic lymph nodes identified within the abdomen or pelvis. Pneumobilia and mild atherosclerosis again noted.  SKELETON  Corresponding with the abnormality on MRI, there is abnormal hypermetabolic activity within the C5 vertebral body and its posterior elements, asymmetric to the right. This has an SUV max of 9.9. No other abnormal osseous activity  identified.  IMPRESSION: 1. The previously demonstrated abnormal C5 vertebral body is hypermetabolic. No other osseous lesions demonstrated. 2. There are small hypermetabolic lymph nodes within the left mesentery and both inguinal regions. 3. No hypermetabolic activity identified within the lungs or solid visceral organs. 4. The overall constellation of findings is most consistent with lymphoma, although metastatic carcinoma would be a consideration. No primary identified. The left inguinal lymph node may be most amenable to tissue sampling.   Electronically Signed   By: Richardean Sale M.D.   On: 07/10/2015 09:01   Dg C-arm 1-60 Min  07/26/2015   CLINICAL DATA:  Cervical spine fusion.  EXAM: DG C-ARM 61-120 MIN; CERVICAL SPINE - 2-3 VIEW  COMPARISON:  MRI 07/20/2015.  FINDINGS: C4 through C6 anterior interbody fusion. Hardware intact. Anatomic alignment. Two images. Fluoroscopic time 0 min 9 seconds.  IMPRESSION: C4 through C6 anterior and interbody fusion.   Electronically Signed   By: Marcello Moores  Register   On: 07/26/2015 15:57    Antibiotics:  Anti-infectives    Start     Dose/Rate Route Frequency Ordered Stop   07/27/15 0600  ceFAZolin (ANCEF) IVPB 2 g/50 mL premix     2 g 100 mL/hr over 30 Minutes Intravenous On call to O.R. 07/26/15 1206 07/26/15 1349   07/26/15 2100  ceFAZolin (ANCEF) IVPB 1 g/50 mL premix     1 g 100 mL/hr over 30 Minutes Intravenous Every 8 hours 07/26/15 1812 07/27/15 0540   07/26/15 1431  bacitracin 50,000 Units in sodium chloride irrigation 0.9 % 500 mL irrigation  Status:  Discontinued       As needed 07/26/15 1431 07/26/15 1605   07/26/15 1215  ceFAZolin (ANCEF) 2-3 GM-% IVPB SOLR    Comments:  Sammuel Cooper   : cabinet override      07/26/15 1215 07/27/15 0029      Discharge Exam: Blood pressure 138/78, pulse 51, temperature 97.9 F (36.6 C), temperature source Oral, resp. rate 20, height 6\' 1"  (1.854 m), weight 211 lb (95.709 kg), SpO2 98 %. Neurologic:  Grossly normal Dressing dry  Discharge Medications:     Medication List    TAKE these medications        alum & mag hydroxide-simeth 200-200-20 MG/5ML suspension  Commonly known as:  MAALOX/MYLANTA  Take 15-30 mLs by mouth every 6 (six) hours as needed for indigestion or heartburn.     dexamethasone 4 MG tablet  Commonly known as:  DECADRON  Take 0.5 tablets (2 mg total) by mouth 3 (three) times daily.     diazepam 5 MG tablet  Commonly known as:  VALIUM  Take 5 mg by mouth every 6 (six) hours as needed for anxiety (anxiety).     HYDROcodone-acetaminophen 5-325 MG per tablet  Commonly known as:  NORCO/VICODIN  Take 1 tablet by mouth every 4 (four) hours as needed (mild pain).     nystatin 100000 UNIT/ML suspension  Commonly known as:  MYCOSTATIN  Take 5 mLs (500,000 Units total) by mouth 4 (four) times daily.  omeprazole 40 MG capsule  Commonly known as:  PRILOSEC  Take 1 capsule (40 mg total) by mouth daily.     simvastatin 20 MG tablet  Commonly known as:  ZOCOR  Take 20 mg by mouth daily.     temazepam 15 MG capsule  Commonly known as:  RESTORIL  Take 15 mg by mouth at bedtime as needed for sleep (sleep).     traMADol 50 MG tablet  Commonly known as:  ULTRAM  Take 50-100 mg by mouth every 6 (six) hours as needed for moderate pain or severe pain (pain).        Disposition: home   Final Dx: C5 corpectomy for tumor      Discharge Instructions    Call MD for:  difficulty breathing, headache or visual disturbances    Complete by:  As directed      Call MD for:  persistant nausea and vomiting    Complete by:  As directed      Call MD for:  redness, tenderness, or signs of infection (pain, swelling, redness, odor or green/yellow discharge around incision site)    Complete by:  As directed      Call MD for:  severe uncontrolled pain    Complete by:  As directed      Call MD for:  temperature >100.4    Complete by:  As directed      Diet - low sodium  heart healthy    Complete by:  As directed      Discharge instructions    Complete by:  As directed   May shower, no heavy lifting or driving     Increase activity slowly    Complete by:  As directed      Remove dressing in 48 hours    Complete by:  As directed            Follow-up Information    Follow up with Addie Cederberg S, MD In 2 weeks.   Specialty:  Neurosurgery   Contact information:   [address] [address] [place] [address] [phone]        Signed: Eustace Moore 07/27/2015, 7:31 AM

## 2015-07-28 ENCOUNTER — Encounter (HOSPITAL_COMMUNITY): Payer: Self-pay | Admitting: Neurological Surgery

## 2015-07-28 ENCOUNTER — Other Ambulatory Visit: Payer: Self-pay | Admitting: Hematology

## 2015-07-28 DIAGNOSIS — C8330 Diffuse large B-cell lymphoma, unspecified site: Secondary | ICD-10-CM

## 2015-07-28 LAB — TYPE AND SCREEN
ABO/RH(D): B NEG
Antibody Screen: NEGATIVE
UNIT DIVISION: 0
Unit division: 0

## 2015-08-02 ENCOUNTER — Encounter: Payer: Self-pay | Admitting: Neurological Surgery

## 2015-08-03 ENCOUNTER — Telehealth: Payer: Self-pay | Admitting: Hematology

## 2015-08-03 ENCOUNTER — Other Ambulatory Visit: Payer: Self-pay | Admitting: *Deleted

## 2015-08-03 ENCOUNTER — Other Ambulatory Visit: Payer: Self-pay | Admitting: Hematology

## 2015-08-03 DIAGNOSIS — C8330 Diffuse large B-cell lymphoma, unspecified site: Secondary | ICD-10-CM

## 2015-08-03 NOTE — Telephone Encounter (Signed)
Patient aware of chemo appoinitment

## 2015-08-03 NOTE — Telephone Encounter (Signed)
Spoke with patient and he is aware of his chemo class and that i will call with time for chemo as i have sent this to michelle to help schedule

## 2015-08-04 ENCOUNTER — Telehealth: Payer: Self-pay | Admitting: Hematology

## 2015-08-04 ENCOUNTER — Other Ambulatory Visit: Payer: Self-pay | Admitting: *Deleted

## 2015-08-04 NOTE — Telephone Encounter (Signed)
Spoke with Dr. Irene Limbo yesterday re entering a care plan for tx - not being able to start tx (new start) until 08/10/15 and if this affects the date for IT methotrexate that was ordered for 08/10/15 (rad). Per Dr. Irene Limbo IT methotrexate can be done on 08/11/15 and he will change the expected date on the order for rad. I also added a comment to the order for rad on the appointment desk that Per Dr. Irene Limbo IT methotrexate can be done on 08/11/15 due to chemo will be 08/10/15. Echo already on schedule - scheduled by desk nurse w/auth number displayed in comment section - Vaughan Basta in [place] informed. See previous note - patient informed of appointments yesterday.

## 2015-08-04 NOTE — Telephone Encounter (Signed)
Patient aware of lab appointment. °

## 2015-08-07 ENCOUNTER — Encounter (HOSPITAL_COMMUNITY): Payer: Self-pay | Admitting: Emergency Medicine

## 2015-08-07 ENCOUNTER — Emergency Department (HOSPITAL_COMMUNITY): Payer: PPO

## 2015-08-07 ENCOUNTER — Other Ambulatory Visit (HOSPITAL_BASED_OUTPATIENT_CLINIC_OR_DEPARTMENT_OTHER): Payer: PPO

## 2015-08-07 ENCOUNTER — Emergency Department (HOSPITAL_COMMUNITY)
Admission: EM | Admit: 2015-08-07 | Discharge: 2015-08-07 | Disposition: A | Discharge status: Home / Self Care | Payer: PPO | Attending: Emergency Medicine | Admitting: Emergency Medicine

## 2015-08-07 ENCOUNTER — Ambulatory Visit (HOSPITAL_COMMUNITY): Payer: PPO

## 2015-08-07 ENCOUNTER — Encounter: Payer: Self-pay | Admitting: Hematology

## 2015-08-07 ENCOUNTER — Other Ambulatory Visit: Payer: PPO

## 2015-08-07 ENCOUNTER — Ambulatory Visit (HOSPITAL_BASED_OUTPATIENT_CLINIC_OR_DEPARTMENT_OTHER): Payer: PPO | Admitting: Hematology

## 2015-08-07 VITALS — BP 138/84 | HR 115 | Temp 98.3°F | Resp 18 | Ht 73.0 in | Wt 217.5 lb

## 2015-08-07 DIAGNOSIS — C8330 Diffuse large B-cell lymphoma, unspecified site: Secondary | ICD-10-CM

## 2015-08-07 DIAGNOSIS — Z87891 Personal history of nicotine dependence: Secondary | ICD-10-CM | POA: Diagnosis not present

## 2015-08-07 DIAGNOSIS — K219 Gastro-esophageal reflux disease without esophagitis: Secondary | ICD-10-CM | POA: Insufficient documentation

## 2015-08-07 DIAGNOSIS — Z8603 Personal history of neoplasm of uncertain behavior: Secondary | ICD-10-CM | POA: Diagnosis not present

## 2015-08-07 DIAGNOSIS — Z9889 Other specified postprocedural states: Secondary | ICD-10-CM | POA: Insufficient documentation

## 2015-08-07 DIAGNOSIS — M5412 Radiculopathy, cervical region: Secondary | ICD-10-CM | POA: Insufficient documentation

## 2015-08-07 DIAGNOSIS — Z8619 Personal history of other infectious and parasitic diseases: Secondary | ICD-10-CM | POA: Diagnosis not present

## 2015-08-07 DIAGNOSIS — B370 Candidal stomatitis: Secondary | ICD-10-CM

## 2015-08-07 DIAGNOSIS — C8338 Diffuse large B-cell lymphoma, lymph nodes of multiple sites: Secondary | ICD-10-CM | POA: Diagnosis not present

## 2015-08-07 DIAGNOSIS — Z85830 Personal history of malignant neoplasm of bone: Secondary | ICD-10-CM | POA: Insufficient documentation

## 2015-08-07 DIAGNOSIS — F419 Anxiety disorder, unspecified: Secondary | ICD-10-CM | POA: Insufficient documentation

## 2015-08-07 DIAGNOSIS — K5900 Constipation, unspecified: Secondary | ICD-10-CM

## 2015-08-07 DIAGNOSIS — R29898 Other symptoms and signs involving the musculoskeletal system: Secondary | ICD-10-CM

## 2015-08-07 DIAGNOSIS — Z79899 Other long term (current) drug therapy: Secondary | ICD-10-CM | POA: Insufficient documentation

## 2015-08-07 DIAGNOSIS — M6281 Muscle weakness (generalized): Secondary | ICD-10-CM | POA: Diagnosis present

## 2015-08-07 DIAGNOSIS — R200 Anesthesia of skin: Secondary | ICD-10-CM

## 2015-08-07 HISTORY — DX: Anesthesia of skin: R20.0

## 2015-08-07 LAB — CBC WITH DIFFERENTIAL/PLATELET
BASOS ABS: 0 10*3/uL (ref 0.0–0.1)
BASOS PCT: 0 % (ref 0–1)
EOS ABS: 0 10*3/uL (ref 0.0–0.7)
EOS PCT: 1 % (ref 0–5)
HCT: 36.7 % — ABNORMAL LOW (ref 39.0–52.0)
Hemoglobin: 12.2 g/dL — ABNORMAL LOW (ref 13.0–17.0)
Lymphocytes Relative: 28 % (ref 12–46)
Lymphs Abs: 2.3 10*3/uL (ref 0.7–4.0)
MCH: 29.2 pg (ref 26.0–34.0)
MCHC: 33.2 g/dL (ref 30.0–36.0)
MCV: 87.8 fL (ref 78.0–100.0)
MONO ABS: 0.5 10*3/uL (ref 0.1–1.0)
MONOS PCT: 6 % (ref 3–12)
Neutro Abs: 5.2 10*3/uL (ref 1.7–7.7)
Neutrophils Relative %: 65 % (ref 43–77)
PLATELETS: 144 10*3/uL — AB (ref 150–400)
RBC: 4.18 MIL/uL — ABNORMAL LOW (ref 4.22–5.81)
RDW: 15.7 % — ABNORMAL HIGH (ref 11.5–15.5)
WBC: 8 10*3/uL (ref 4.0–10.5)

## 2015-08-07 LAB — CBC & DIFF AND RETIC
BASO%: 0.7 % (ref 0.0–2.0)
BASOS ABS: 0.1 10*3/uL (ref 0.0–0.1)
EOS%: 0.5 % (ref 0.0–7.0)
Eosinophils Absolute: 0 10*3/uL (ref 0.0–0.5)
HCT: 35.5 % — ABNORMAL LOW (ref 38.4–49.9)
HEMOGLOBIN: 12.2 g/dL — AB (ref 13.0–17.1)
Immature Retic Fract: 13.3 % — ABNORMAL HIGH (ref 3.00–10.60)
LYMPH%: 27 % (ref 14.0–49.0)
MCH: 30 pg (ref 27.2–33.4)
MCHC: 34.3 g/dL (ref 32.0–36.0)
MCV: 87.5 fL (ref 79.3–98.0)
MONO#: 0.3 10*3/uL (ref 0.1–0.9)
MONO%: 4.2 % (ref 0.0–14.0)
NEUT#: 4.7 10*3/uL (ref 1.5–6.5)
NEUT%: 67.6 % (ref 39.0–75.0)
PLATELETS: 178 10*3/uL (ref 140–400)
RBC: 4.05 10*6/uL — ABNORMAL LOW (ref 4.20–5.82)
RDW: 15.6 % — ABNORMAL HIGH (ref 11.0–14.6)
Retic %: 2.51 % — ABNORMAL HIGH (ref 0.80–1.80)
Retic Ct Abs: 101.66 10*3/uL — ABNORMAL HIGH (ref 34.80–93.90)
WBC: 7 10*3/uL (ref 4.0–10.3)
lymph#: 1.9 10*3/uL (ref 0.9–3.3)
nRBC: 0 % (ref 0–0)

## 2015-08-07 LAB — BASIC METABOLIC PANEL
ANION GAP: 8 (ref 5–15)
BUN: 18 mg/dL (ref 6–20)
CO2: 24 mmol/L (ref 22–32)
Calcium: 8.7 mg/dL — ABNORMAL LOW (ref 8.9–10.3)
Chloride: 102 mmol/L (ref 101–111)
Creatinine, Ser: 0.7 mg/dL (ref 0.61–1.24)
Glucose, Bld: 90 mg/dL (ref 65–99)
Potassium: 4.2 mmol/L (ref 3.5–5.1)
Sodium: 134 mmol/L — ABNORMAL LOW (ref 135–145)

## 2015-08-07 LAB — COMPREHENSIVE METABOLIC PANEL (CC13)
ALT: 57 U/L — ABNORMAL HIGH (ref 0–55)
AST: 28 U/L (ref 5–34)
Albumin: 2.9 g/dL — ABNORMAL LOW (ref 3.5–5.0)
Alkaline Phosphatase: 79 U/L (ref 40–150)
Anion Gap: 7 meq/L (ref 3–11)
BUN: 19 mg/dL (ref 7.0–26.0)
CO2: 24 meq/L (ref 22–29)
Calcium: 8.9 mg/dL (ref 8.4–10.4)
Chloride: 105 meq/L (ref 98–109)
Creatinine: 0.8 mg/dL (ref 0.7–1.3)
EGFR: >90 ml/min/1.73 m2
Glucose: 137 mg/dL (ref 70–140)
Potassium: 4.4 meq/L (ref 3.5–5.1)
Sodium: 136 meq/L (ref 136–145)
Total Bilirubin: 0.53 mg/dL (ref 0.20–1.20)
Total Protein: 5.8 g/dL — ABNORMAL LOW (ref 6.4–8.3)

## 2015-08-07 LAB — SEDIMENTATION RATE: Sed Rate: 38 mm/hr — ABNORMAL HIGH (ref 0–16)

## 2015-08-07 LAB — LACTATE DEHYDROGENASE (CC13): LDH: 362 U/L — ABNORMAL HIGH (ref 125–245)

## 2015-08-07 LAB — URIC ACID (CC13): URIC ACID, SERUM: 3.9 mg/dL (ref 2.6–7.4)

## 2015-08-07 LAB — C-REACTIVE PROTEIN: CRP: 8.3 mg/dL — ABNORMAL HIGH

## 2015-08-07 LAB — TISSUE HYBRIDIZATION TO NCBH

## 2015-08-07 MED ORDER — GADOBENATE DIMEGLUMINE 529 MG/ML IV SOLN
20.0000 mL | Freq: Once | INTRAVENOUS | Status: AC | PRN
  Administered 2015-08-07: 20 mL via INTRAVENOUS

## 2015-08-07 MED ORDER — ONDANSETRON HCL 4 MG/2ML IJ SOLN
4.0000 mg | Freq: Once | INTRAMUSCULAR | Status: AC
  Administered 2015-08-07: 4 mg via INTRAVENOUS
  Filled 2015-08-07: qty 2

## 2015-08-07 MED ORDER — SENNOSIDES-DOCUSATE SODIUM 8.6-50 MG PO TABS
2.0000 | ORAL_TABLET | Freq: Every day | ORAL | Status: DC
Start: 2015-08-07 — End: 2015-09-05

## 2015-08-07 MED ORDER — DEXAMETHASONE SODIUM PHOSPHATE 10 MG/ML IJ SOLN
10.0000 mg | Freq: Once | INTRAMUSCULAR | Status: AC
  Administered 2015-08-07: 10 mg via INTRAVENOUS
  Filled 2015-08-07: qty 1

## 2015-08-07 MED ORDER — MORPHINE SULFATE 4 MG/ML IJ SOLN
4.0000 mg | Freq: Once | INTRAMUSCULAR | Status: AC
  Administered 2015-08-07: 4 mg via INTRAVENOUS
  Filled 2015-08-07: qty 1

## 2015-08-07 NOTE — ED Provider Notes (Signed)
CSN: [id number]     Arrival date & time 08/07/15  1021 History   First MD Initiated Contact with Patient 08/07/15 1022     No chief complaint on file.    (Consider location/radiation/quality/duration/timing/severity/associated sxs/prior Treatment) HPI Comments: Patient referred to the emergency department by his oncologist for evaluation of new right upper extremity weakness. Patient was recently diagnosed with lymphoma. He was found to have a lesion in his fifth cervical vertebrae, underwent surgery on August 3. Patient was noted to have weakness of the right arm upon evaluation by his oncologist today, reports that this has occurred over the last 24 hours or so. He has not had any fever, chills. Patient reports that he had been on Sterapred since June. He was tapered after surgery and finished starts 2 days ago. He does report that he had a lot of pain in the neck and shoulder area last night, but the pain is resolved this morning.   Past Medical History  Diagnosis Date  . History of bleeding ulcers 1990's  . 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 Anxiety   . GERD (gastroesophageal reflux disease)   . H. pylori infection   . History of blood transfusion   . Bone cancer      tumor on C5   Past Surgical History  Procedure Laterality Date  . Hernia repair Bilateral     with mesh  . Colonoscopy    . Eye surgery Right     catheter  . Anterior cervical corpectomy N/A 07/26/2015    Procedure: Cervical five Corpectomy/Cervical four-six Fusion/Plate ;  Surgeon: Eustace Moore, MD;  Location: [facility];  Service: Neurosurgery;  Laterality: N/A;  C5 Corpectomy/C4-6 Fusion/Plate C4-6   Family History  Problem Relation Age of Onset  . Hypertension Mother    Social History  Substance Use Topics  . Smoking status: Former Smoker -- 1.00 packs/day for 10 years    Types: Cigarettes    Quit date: 12/23/1981  . Smokeless tobacco: Never Used  . Alcohol Use: 3.0 oz/week    2 Glasses of wine, 3 Cans  of beer per week    Review of Systems  Neurological: Positive for weakness.      Allergies  Review of patient's allergies indicates no known allergies.  Home Medications   Prior to Admission medications   Medication Sig Start Date End Date Taking? Authorizing Provider  diazepam (VALIUM) 5 MG tablet Take 5 mg by mouth every 6 (six) hours as needed for anxiety (anxiety).    Historical Provider, MD  HYDROcodone-acetaminophen (NORCO/VICODIN) 5-325 MG per tablet Take 1 tablet by mouth every 4 (four) hours as needed (mild pain). 07/27/15   Eustace Moore, MD  nystatin (MYCOSTATIN) 100000 UNIT/ML suspension Take 5 mLs (500,000 Units total) by mouth 4 (four) times daily. Patient not taking: Reported on 08/07/2015 07/24/15 08/07/15  Brunetta Genera, MD  omeprazole (PRILOSEC) 40 MG capsule Take 1 capsule (40 mg total) by mouth daily. Patient not taking: Reported on 08/07/2015 07/24/15   Brunetta Genera, MD  senna-docusate (SENNA S) 8.6-50 MG per tablet Take 2 tablets by mouth at bedtime. 08/07/15   Brunetta Genera, MD  simvastatin (ZOCOR) 20 MG tablet Take 20 mg by mouth daily.    Historical Provider, MD  temazepam (RESTORIL) 15 MG capsule Take 15 mg by mouth at bedtime as needed for sleep (sleep).    Historical Provider, MD  traMADol (ULTRAM) 50 MG tablet Take 50-100 mg by mouth every  6 (six) hours as needed for moderate pain or severe pain (pain).    Historical Provider, MD   There were no vitals taken for this visit. Physical Exam  Constitutional: He is oriented to person, place, and time. He appears well-developed and well-nourished. No distress.  HENT:  Head: Normocephalic and atraumatic.  Right Ear: Hearing normal.  Left Ear: Hearing normal.  Nose: Nose normal.  Mouth/Throat: Oropharynx is clear and moist and mucous membranes are normal.  Eyes: Conjunctivae and EOM are normal. Pupils are equal, round, and reactive to light.  Neck: Normal range of motion. Neck supple.   Cardiovascular: Regular rhythm, S1 normal and S2 normal.  Exam reveals no gallop and no friction rub.   No murmur heard. Pulmonary/Chest: Effort normal and breath sounds normal. No respiratory distress. He exhibits no tenderness.  Abdominal: Soft. Normal appearance and bowel sounds are normal. There is no hepatosplenomegaly. There is no tenderness. There is no rebound, no guarding, no tenderness at McBurney's point and negative Murphy's sign. No hernia.  Musculoskeletal: Normal range of motion.  Neurological: He is alert and oriented to person, place, and time. He has normal strength. No cranial nerve deficit or sensory deficit. Coordination normal. GCS eye subscore is 4. GCS verbal subscore is 5. GCS motor subscore is 6.  Significant proximal muscle weakness RUE:  Unable to raise arm greater than 30 degrees 2/5 strength elbow flexion Normal grip strength  Skin: Skin is warm, dry and intact. No rash noted. No cyanosis.  Psychiatric: He has a normal mood and affect. His speech is normal and behavior is normal. Thought content normal.  Nursing note and vitals reviewed.   ED Course  Procedures (including critical care time) Labs Review Labs Reviewed - No data to display  Imaging Review No results found. I, Coulton Schlink J., personally reviewed and evaluated these images and lab results as part of my medical decision-making.   EKG Interpretation None      MDM   Final diagnoses:  None   cervical radiculopathy B-cell lymphoma cervical spine  Patient presents to the ER for evaluation of new onset inability to raise his right arm. Patient reports over the last 1-2 days he has noticed increasing inability to raise his arm and weakness of the right upper extremity. Examination reveals significant proximal weakness, specifically inability to abduct the arm at the shoulder and decreased extension at the elbow. This is a C5-C6 distribution of decreased strength. Patient underwent C5  cervical spine surgery for B-cell lymphoma 2 weeks ago. He also had been on steroidal and was tapered off steroids, last dose 2 days ago. Weakness started after stopping the final dose of steroid.  MRI does not show any postoperative complications. There is no sign of infection or hematoma. There is evidence of residual tumor that appears to be pressing on the nerve root at C5 and C6 which explains his current symptoms. This was discussed briefly with Dr. Saintclair Halsted, on call for neurosurgery. He did review the MRI and agreed that there was no further surgery necessary.  Case discussed with Dr. Irene Limbo, on call for oncology. He is also reviewed the images. He recommends reinitiating IV steroidal here in the ER and patient can be discharged on oral steroidal, will start chemotherapy tomorrow.      Orpah Greek, MD 08/07/15 [phone]

## 2015-08-07 NOTE — ED Notes (Signed)
Bed: OO17 Expected date:  Expected time:  Means of arrival:  Comments: Pt from [facility]

## 2015-08-07 NOTE — Discharge Instructions (Signed)

## 2015-08-07 NOTE — Progress Notes (Signed)
Ethan Kitchen  HEMATOLOGY ONCOLOGY PROGRESS NOTE Date of service 07/24/2015 Patient Care Team: Lona Kettle, MD as PCP - General (Family Medicine) Brunetta Genera, MD as Consulting Physician (Hematology)  SUMMARY OF ONCOLOGIC HISTORY:   Osseous metastasis   06/21/2015 Imaging CT Chest/abd/pelvis: IMPRESSION: 5 mm nodule within the right lower lobe as described.  Pneumobilia consistent with a prior sphincterotomy.  No acute abnormality is identified. No findings to suggest chest etiology for the known lesion at C5 are seen.   06/21/2015 Imaging MRI c spine1. Diffuse marrow replacement of the C5 vertebral body highly concerning for neoplasm (metastatic disease, myeloma, or lymphoma). Epidural tumor at this level results in moderate spinal stenosis with moderate impression on the spinal cord    07/10/2015 PET scan 1. The previously demonstrated abnormal C5 vertebral body is hypermetabolic. No other osseous lesions demonstrated. 2. There are small hypermetabolic lymph nodes within the left mesentery and both inguinal regions.    Diagnosis: Diffuse large B-cell lymphoma Stage IVAE with extranodal involvement of C5 vertebra status post C5 corpectomy   INTERVAL HISTORY:  Ethan Stewart is here for his scheduled followup after having had a C5 corpectomy on 07/26/2015. He notes that he was weaned off steroids on Friday and over the weekend noted some increased difficulty with raising his right upper extremity. He notes no other acute new symptoms. He was sent to the emergency room for further evaluation. He had an MRI of his C-spine which showed intact surgical hardware that some concern with disease progression of his lymphoma causing C5-6 and root compression on the right side. He was given dexamethasone 10 mg IV and his R CHOP chemotherapy appointment was moved to 08/08/2015 for urgent treatment of his lymphoma that appears to be progressing rapidly of steroids. No fevers or chills. No night sweats. No other focal  symptoms.  We discussed his diagnosis in detail and I gave him a copy of his pathology report. We went over the treatment plan which would involve R CHOP every 21 days and intrathecal methotrexate plus hydrocortisone CNS prophylaxis. He is in agreement with this. She is echo was scheduled for 4 PM today has been rescheduled for 8 AM in the morning.  Notice some issues with constipation postoperatively while on pain medications. Has been taking tramadol which has not been controlling his pain. Asked him to switch back to Norco with senna S for bowel prophylaxis.   REVIEW OF SYSTEMS:   Constitutional: Denies fevers, chills or abnormal weight loss Eyes: Denies blurriness of vision Ears, nose, mouth, throat, and face: Denies mucositis or sore throat Respiratory: Denies cough, dyspnea or wheezes Cardiovascular: Denies palpitation, chest discomfort or lower extremity swelling Gastrointestinal:  Denies nausea, heartburn or change in bowel habits Skin: Denies abnormal skin rashes Lymphatics: Denies new lymphadenopathy or easy bruising Neurological:Denies numbness, tingling or new weaknesses Behavioral/Psych: Mood is stable, no new changes  All other systems were reviewed with the patient and are negative.  I have reviewed the past medical history, past surgical history, social history and family history with the patient and they are unchanged from previous note.  ALLERGIES:  has No Known Allergies.  MEDICATIONS:  Current Outpatient Prescriptions  Medication Sig Dispense Refill  . diazepam (VALIUM) 5 MG tablet Take 5 mg by mouth every 6 (six) hours as needed for anxiety.     Ethan Kitchen HYDROcodone-acetaminophen (NORCO/VICODIN) 5-325 MG per tablet Take 1 tablet by mouth every 4 (four) hours as needed (mild pain). 30 tablet 0  . simvastatin (ZOCOR)  20 MG tablet Take 20 mg by mouth daily.    . temazepam (RESTORIL) 15 MG capsule Take 15 mg by mouth at bedtime as needed for sleep (sleep).    . traMADol  (ULTRAM) 50 MG tablet Take 50-100 mg by mouth every 6 (six) hours as needed for moderate pain or severe pain (pain).    . Multiple Vitamin (MULTIVITAMIN WITH MINERALS) TABS tablet Take 1 tablet by mouth daily.    Ethan Kitchen nystatin (MYCOSTATIN) 100000 UNIT/ML suspension Take 5 mLs (500,000 Units total) by mouth 4 (four) times daily. (Patient not taking: Reported on 08/07/2015) 280 mL 0  . omeprazole (PRILOSEC) 40 MG capsule Take 1 capsule (40 mg total) by mouth daily. 30 capsule 0  . senna-docusate (SENNA S) 8.6-50 MG per tablet Take 2 tablets by mouth at bedtime. (Patient not taking: Reported on 08/07/2015) 60 tablet 1   No current facility-administered medications for this visit.    PHYSICAL EXAMINATION: ECOG PERFORMANCE STATUS: 1 - Symptomatic but completely ambulatory  Filed Vitals:   08/07/15 0850  BP: 138/84  Pulse: 115  Temp: 98.3 F (36.8 C)  Resp: 18   Filed Weights   08/07/15 0850  Weight: 217 lb 8 oz (98.657 kg)    GENERAL:alert, no distress and comfortable SKIN: skin color, texture, turgor are normal, no rashes or significant lesions EYES: normal, Conjunctiva are pink and non-injected, sclera clear OROPHARYNX:no exudate, no erythema and lips, buccal mucosa, and tongue normal  NECK: supple, thyroid normal size, non-tender, without nodularity LYMPH: small palpable left inguinal adenopathy. LUNGS: clear to auscultation and percussion with normal breathing effort HEART: regular rate & rhythm and no murmurs and no lower extremity edema ABDOMEN:abdomen soft, non-tender and normal bowel sounds Musculoskeletal:no cyanosis of digits and no clubbing  NEURO: alert & oriented x 3 with fluent speech, 4-/5 weakness right upper extremity abduction  LABORATORY DATA:   . CBC Latest Ref Rng 08/07/2015 08/07/2015 07/24/2015  WBC 4.0 - 10.5 K/uL 8.0 7.0 12.3(H)  Hemoglobin 13.0 - 17.0 g/dL 12.2(L) 12.2(L) 14.9  Hematocrit 39.0 - 52.0 % 36.7(L) 35.5(L) 44.7  Platelets 150 - 400 K/uL 144(L) 178  160    . CMP Latest Ref Rng 08/07/2015 08/07/2015 07/24/2015  Glucose 65 - 99 mg/dL 90 137 130  BUN 6 - 20 mg/dL 18 19.0 27.7(H)  Creatinine 0.61 - 1.24 mg/dL 0.70 0.8 0.9  Sodium 135 - 145 mmol/L 134(L) 136 142  Potassium 3.5 - 5.1 mmol/L 4.2 4.4 4.0  Chloride 101 - 111 mmol/L 102 - -  CO2 22 - 32 mmol/L '24 24 29  ' Calcium 8.9 - 10.3 mg/dL 8.7(L) 8.9 9.0  Total Protein 6.4 - 8.3 g/dL 5.8(L) - 6.0(L)  Total Bilirubin 0.20 - 1.20 mg/dL 0.53 - 0.65  Alkaline Phos 40 - 150 U/L 79 - 60  AST 5 - 34 U/L 28 - 33  ALT 0 - 55 U/L 57(H) - 74(H)    . Lab Results  Component Value Date   LDH 362* 08/07/2015    RADIOGRAPHIC STUDIES: I have personally reviewed the radiological images as listed and agreed with the findings in the report. Mr Cervical Spine W Wo Contrast  08/07/2015   CLINICAL DATA:  Previous corpectomy at C5 due to tumor involvement. Unable to raise the right arm because of weakness. Symptoms began 08/05/2015. Personal history of lymphoma.  EXAM: MRI CERVICAL SPINE WITHOUT AND WITH CONTRAST  TECHNIQUE: Multiplanar and multiecho pulse sequences of the cervical spine, to include the craniocervical junction and cervicothoracic  junction, were obtained according to standard protocol without and with intravenous contrast.  CONTRAST:  42m MULTIHANCE GADOBENATE DIMEGLUMINE 529 MG/ML IV SOLN  COMPARISON:  Preoperative study 07/20/2015  FINDINGS: No abnormality is seen above or below the operative region of C4 through C6. No osseous lesion. No stenosis of the canal or foramina.  In the operative region of C4 through C6, the patient has had corpectomy at C5 with anterior plate and screws from C4 through C6 with an intervening strut graft. The spinal canal is widely patent with ample subarachnoid space surrounding the cord. No evidence of cord insult.  There appears to be right foraminal involvement by tumor at C4-5 and C5-6. This could affect the C5 and C6 nerve roots.  IMPRESSION: Interval corpectomy  at C5 with fusion from C4 through C6 with intervening strut graft. Wide patency of the central canal. No abnormal cord signal.  Enhancing tissue in the foraminal regions at C4-5 and C5-6 on the right consistent with tumor. This could affect the C5 and C6 nerve roots.   Electronically Signed   By: MNelson ChimesM.D.   On: 08/07/2015 14:58     ASSESSMENT & PLAN:   1) Diffuse large B-cell lymphoma stage IV AE with involvement of mesenteric, inguinal lymph nodes and C5 vertebral body with spinal cord impingement.  He had a C5 corpectomy on 07/26/2015 that showed diffuse large B-cell lymphoma with a Ki-67 ranging from 20 to 90%.  He has recovered well from surgery and his steroids were weaned off last Friday. He developed right arm abduction weakness and was sent to the emergency room with MRI spine showing enhancing tissue in the right C4/C5 and C5/C6 consistent with tumor. Plan -Discussed with the ED physician Dr. PBetsey Holiday Patient will get 1 dose of IV dexamethasone 10 mg and will take 4 mg oral every 8 hours at home.  His R CHOP chemotherapy was preformed to start on a 08/08/2015 given the urgency of the situation. -We'll try to reschedule his diagnostic LP plus intrathecal methotrexate plus hydrocortisone on 08/09/2015 -Echocardiogram was scheduled for 4 PM this afternoon and has been rescheduled for 8 AM tomorrow morning prior to chemotherapy. -Patient was given patient information material about R CHOP chemotherapy regimen as well as intrathecal methotrexate chemotherapy for CNS prophylaxis.  2) Oropharyngeal thrush likely related to high-dose steroid use. result with nystatin swish and swallow .  3] Acid reflux and gastritis likely related to steroid use -controlled -PPI prescribed especially given his history of remote bleeding gastric ulcers  I spent 30 minutes counseling the patient face to face. The total time spent in the appointment was 40 minutes and more than 50% was on counseling and  review of test results, he will therapy counseling, discussion of treatment plan, visiting the patient in the emergency room and discussion with emergency room physician.    GSullivan LoneMD MEvertonHematology/Oncology Physician [facility] (Office):       [phone](Work cell):  [phone](Fax):           [phone]

## 2015-08-07 NOTE — ED Notes (Signed)
Patient transported to MRI 

## 2015-08-07 NOTE — ED Notes (Signed)
Per pt, stated he has been having weakness in his right arm, and is unable to raise it fully. This began 8/13. Denies pain. Per pt, he had tumor removed from C5 vertebrae on 07/26/15. Recently tapered off steroids. He has not had any chemo and does not have a port.

## 2015-08-07 NOTE — ED Notes (Signed)
This Agricultural consultant overheard the Pt asking the MRI tech for pain medication.  This is the first time the Pt has reported pain to staff.  EDP notified.

## 2015-08-07 NOTE — ED Notes (Signed)
Pt, sent by [facility], c/o RUE weakness and increased pain x 1 day.  [facility] reports the Pt had a recent steroid taper and neuro surgery x "around 2 weeks ago."  Pt was at [facility] for first chemo treatment.  Did not receive treatment.  Hx of lymphoma.

## 2015-08-08 ENCOUNTER — Other Ambulatory Visit: Payer: PPO

## 2015-08-08 ENCOUNTER — Encounter: Payer: Self-pay | Admitting: Hematology

## 2015-08-08 ENCOUNTER — Telehealth: Payer: Self-pay | Admitting: *Deleted

## 2015-08-08 ENCOUNTER — Ambulatory Visit (HOSPITAL_BASED_OUTPATIENT_CLINIC_OR_DEPARTMENT_OTHER): Payer: PPO

## 2015-08-08 ENCOUNTER — Encounter: Payer: Self-pay | Admitting: *Deleted

## 2015-08-08 ENCOUNTER — Other Ambulatory Visit: Payer: Self-pay | Admitting: *Deleted

## 2015-08-08 ENCOUNTER — Ambulatory Visit (HOSPITAL_COMMUNITY): Payer: PPO | Attending: Hematology

## 2015-08-08 ENCOUNTER — Other Ambulatory Visit: Payer: Self-pay | Admitting: Hematology

## 2015-08-08 ENCOUNTER — Other Ambulatory Visit: Payer: Self-pay

## 2015-08-08 VITALS — BP 133/68 | HR 90 | Temp 97.8°F | Resp 18

## 2015-08-08 DIAGNOSIS — I517 Cardiomegaly: Secondary | ICD-10-CM | POA: Insufficient documentation

## 2015-08-08 DIAGNOSIS — I253 Aneurysm of heart: Secondary | ICD-10-CM | POA: Insufficient documentation

## 2015-08-08 DIAGNOSIS — I351 Nonrheumatic aortic (valve) insufficiency: Secondary | ICD-10-CM | POA: Insufficient documentation

## 2015-08-08 DIAGNOSIS — Z8249 Family history of ischemic heart disease and other diseases of the circulatory system: Secondary | ICD-10-CM | POA: Diagnosis not present

## 2015-08-08 DIAGNOSIS — C8330 Diffuse large B-cell lymphoma, unspecified site: Secondary | ICD-10-CM

## 2015-08-08 DIAGNOSIS — Z5112 Encounter for antineoplastic immunotherapy: Secondary | ICD-10-CM | POA: Diagnosis not present

## 2015-08-08 DIAGNOSIS — T380X5A Adverse effect of glucocorticoids and synthetic analogues, initial encounter: Secondary | ICD-10-CM

## 2015-08-08 DIAGNOSIS — Z5111 Encounter for antineoplastic chemotherapy: Secondary | ICD-10-CM | POA: Diagnosis not present

## 2015-08-08 DIAGNOSIS — G720 Drug-induced myopathy: Secondary | ICD-10-CM

## 2015-08-08 DIAGNOSIS — R29898 Other symptoms and signs involving the musculoskeletal system: Secondary | ICD-10-CM

## 2015-08-08 DIAGNOSIS — C8338 Diffuse large B-cell lymphoma, lymph nodes of multiple sites: Secondary | ICD-10-CM | POA: Diagnosis not present

## 2015-08-08 DIAGNOSIS — I340 Nonrheumatic mitral (valve) insufficiency: Secondary | ICD-10-CM | POA: Insufficient documentation

## 2015-08-08 LAB — HEPATITIS C ANTIBODY: HCV Ab: NEGATIVE

## 2015-08-08 LAB — SEDIMENTATION RATE: SED RATE: 27 mm/h — AB (ref 0–20)

## 2015-08-08 LAB — PHOSPHORUS: PHOSPHORUS: 2.5 mg/dL (ref 2.1–4.3)

## 2015-08-08 LAB — HEPATITIS B CORE ANTIBODY, TOTAL: HEP B C TOTAL AB: NONREACTIVE

## 2015-08-08 LAB — HEPATITIS B SURFACE ANTIGEN: Hepatitis B Surface Ag: NEGATIVE

## 2015-08-08 LAB — HIV ANTIBODY (ROUTINE TESTING W REFLEX): HIV: NONREACTIVE

## 2015-08-08 MED ORDER — DIPHENHYDRAMINE HCL 25 MG PO CAPS
50.0000 mg | ORAL_CAPSULE | Freq: Once | ORAL | Status: AC
  Administered 2015-08-08: 50 mg via ORAL

## 2015-08-08 MED ORDER — CYCLOPHOSPHAMIDE CHEMO INJECTION 1 GM
750.0000 mg/m2 | Freq: Once | INTRAMUSCULAR | Status: AC
  Administered 2015-08-08: 1660 mg via INTRAVENOUS
  Filled 2015-08-08: qty 83

## 2015-08-08 MED ORDER — VINCRISTINE SULFATE CHEMO INJECTION 1 MG/ML
2.0000 mg | Freq: Once | INTRAVENOUS | Status: AC
  Administered 2015-08-08: 2 mg via INTRAVENOUS
  Filled 2015-08-08: qty 2

## 2015-08-08 MED ORDER — LIDOCAINE-PRILOCAINE 2.5-2.5 % EX CREA: TOPICAL_CREAM | CUTANEOUS | Status: DC

## 2015-08-08 MED ORDER — PREDNISONE 20 MG PO TABS: 40.0000 mg/m2 | ORAL_TABLET | Freq: Every day | ORAL | Status: DC

## 2015-08-08 MED ORDER — DOXORUBICIN HCL CHEMO IV INJECTION 2 MG/ML
50.0000 mg/m2 | Freq: Once | INTRAVENOUS | Status: AC
  Administered 2015-08-08: 112 mg via INTRAVENOUS
  Filled 2015-08-08: qty 56

## 2015-08-08 MED ORDER — ACETAMINOPHEN 325 MG PO TABS
ORAL_TABLET | ORAL | Status: AC
  Filled 2015-08-08: qty 2

## 2015-08-08 MED ORDER — DIPHENHYDRAMINE HCL 25 MG PO CAPS
ORAL_CAPSULE | ORAL | Status: AC
Start: 2015-08-08 — End: 2015-08-08
  Filled 2015-08-08: qty 2

## 2015-08-08 MED ORDER — SODIUM CHLORIDE 0.9 % IV SOLN
Freq: Once | INTRAVENOUS | Status: AC
  Administered 2015-08-08: 10:00:00 via INTRAVENOUS
  Filled 2015-08-08: qty 8

## 2015-08-08 MED ORDER — ALLOPURINOL 300 MG PO TABS: 300.0000 mg | ORAL_TABLET | Freq: Every day | ORAL | Status: DC

## 2015-08-08 MED ORDER — PROCHLORPERAZINE MALEATE 10 MG PO TABS: 10.0000 mg | ORAL_TABLET | Freq: Four times a day (QID) | ORAL | Status: DC | PRN

## 2015-08-08 MED ORDER — RITUXIMAB CHEMO INJECTION 500 MG/50ML
375.0000 mg/m2 | Freq: Once | INTRAVENOUS | Status: AC
  Administered 2015-08-08: 800 mg via INTRAVENOUS
  Filled 2015-08-08: qty 80

## 2015-08-08 MED ORDER — LORAZEPAM 0.5 MG PO TABS: 0.5000 mg | ORAL_TABLET | Freq: Four times a day (QID) | ORAL | Status: DC | PRN

## 2015-08-08 MED ORDER — SODIUM CHLORIDE 0.9 % IV SOLN
Freq: Once | INTRAVENOUS | Status: AC
  Administered 2015-08-08: 10:00:00 via INTRAVENOUS

## 2015-08-08 MED ORDER — ACETAMINOPHEN 325 MG PO TABS
650.0000 mg | ORAL_TABLET | Freq: Once | ORAL | Status: AC
  Administered 2015-08-08: 650 mg via ORAL

## 2015-08-08 MED ORDER — ONDANSETRON HCL 8 MG PO TABS: 8.0000 mg | ORAL_TABLET | Freq: Two times a day (BID) | ORAL | Status: DC

## 2015-08-08 NOTE — Telephone Encounter (Signed)
[facility] [employer]. faxed Prior authorization request for Ondansetron 8 mg.  Request to Managed Care for review.

## 2015-08-08 NOTE — Telephone Encounter (Signed)
Per staff message and POF I have scheduled appts. Advised scheduler of appts. JMW  

## 2015-08-08 NOTE — Patient Instructions (Addendum)
[facility] Discharge Instructions for Patients Receiving Chemotherapy  Today you received the following chemotherapy agents: Rituxan, Cytoxan, Vincristine, Adriamycin   To help prevent nausea and vomiting after your treatment, we encourage you to take your nausea medication as prescribed. If you develop nausea and vomiting that is not controlled by your nausea medication, call the clinic.   BELOW ARE SYMPTOMS THAT SHOULD BE REPORTED IMMEDIATELY:  *FEVER GREATER THAN 100.5 F  *CHILLS WITH OR WITHOUT FEVER  NAUSEA AND VOMITING THAT IS NOT CONTROLLED WITH YOUR NAUSEA MEDICATION  *UNUSUAL SHORTNESS OF BREATH  *UNUSUAL BRUISING OR BLEEDING  TENDERNESS IN MOUTH AND THROAT WITH OR WITHOUT PRESENCE OF ULCERS  *URINARY PROBLEMS  *BOWEL PROBLEMS  UNUSUAL RASH Items with * indicate a potential emergency and should be followed up as soon as possible.  Feel free to call the clinic you have any questions or concerns. The clinic phone number is [phone].  Please show the [place] at check-in to the Emergency Department and triage nurse.  Pegfilgrastim injection What is this medicine? PEGFILGRASTIM (peg fil GRA stim) is a long-acting granulocyte colony-stimulating factor that stimulates the growth of neutrophils, a type of white blood cell important in the body's fight against infection. It is used to reduce the incidence of fever and infection in patients with certain types of cancer who are receiving chemotherapy that affects the bone marrow. This medicine may be used for other purposes; ask your health care provider or pharmacist if you have questions. COMMON BRAND NAME(S): Neulasta What should I tell my health care provider before I take this medicine? They need to know if you have any of these conditions: -latex allergy -ongoing radiation therapy -sickle cell disease -skin reactions to acrylic adhesives (On-Body Injector only) -an unusual or allergic  reaction to pegfilgrastim, filgrastim, other medicines, foods, dyes, or preservatives -pregnant or trying to get pregnant -breast-feeding How should I use this medicine? This medicine is for injection under the skin. If you get this medicine at home, you will be taught how to prepare and give the pre-filled syringe or how to use the On-body Injector. Refer to the patient Instructions for Use for detailed instructions. Use exactly as directed. Take your medicine at regular intervals. Do not take your medicine more often than directed. It is important that you put your used needles and syringes in a special sharps container. Do not put them in a trash can. If you do not have a sharps container, call your pharmacist or healthcare provider to get one. Talk to your pediatrician regarding the use of this medicine in children. Special care may be needed. Overdosage: If you think you have taken too much of this medicine contact a poison control center or emergency room at once. NOTE: This medicine is only for you. Do not share this medicine with others. What if I miss a dose? It is important not to miss your dose. Call your doctor or health care professional if you miss your dose. If you miss a dose due to an On-body Injector failure or leakage, a new dose should be administered as soon as possible using a single prefilled syringe for manual use. What may interact with this medicine? Interactions have not been studied. Give your health care provider a list of all the medicines, herbs, non-prescription drugs, or dietary supplements you use. Also tell them if you smoke, drink alcohol, or use illegal drugs. Some items may interact with your medicine. This list may not describe  all possible interactions. Give your health care provider a list of all the medicines, herbs, non-prescription drugs, or dietary supplements you use. Also tell them if you smoke, drink alcohol, or use illegal drugs. Some items may interact  with your medicine. What should I watch for while using this medicine? You may need blood work done while you are taking this medicine. If you are going to need a MRI, CT scan, or other procedure, tell your doctor that you are using this medicine (On-Body Injector only). What side effects may I notice from receiving this medicine? Side effects that you should report to your doctor or health care professional as soon as possible: -allergic reactions like skin rash, itching or hives, swelling of the face, lips, or tongue -dizziness -fever -pain, redness, or irritation at site where injected -pinpoint red spots on the skin -shortness of breath or breathing problems -stomach or side pain, or pain at the shoulder -swelling -tiredness -trouble passing urine Side effects that usually do not require medical attention (report to your doctor or health care professional if they continue or are bothersome): -bone pain -muscle pain This list may not describe all possible side effects. Call your doctor for medical advice about side effects. You may report side effects to FDA at 1-800-FDA-1088. Where should I keep my medicine? Keep out of the reach of children. Store pre-filled syringes in a refrigerator between 2 and 8 degrees C (36 and 46 degrees F). Do not freeze. Keep in carton to protect from light. Throw away this medicine if it is left out of the refrigerator for more than 48 hours. Throw away any unused medicine after the expiration date. NOTE: This sheet is a summary. It may not cover all possible information. If you have questions about this medicine, talk to your doctor, pharmacist, or health care provider.  2015, Elsevier/Gold Standard. (2014-03-10 16:14:05)

## 2015-08-08 NOTE — Progress Notes (Signed)
Per Dr. Irene Limbo, okay to proceed with D1,C1 RCHOP without results of ECHO performed this morning.

## 2015-08-08 NOTE — Progress Notes (Signed)
Faxed ondansetron pa from to [facility]

## 2015-08-09 ENCOUNTER — Telehealth: Payer: Self-pay | Admitting: *Deleted

## 2015-08-09 ENCOUNTER — Other Ambulatory Visit: Payer: Self-pay | Admitting: Hematology

## 2015-08-09 ENCOUNTER — Ambulatory Visit (HOSPITAL_COMMUNITY)
Admission: RE | Admit: 2015-08-09 | Discharge: 2015-08-09 | Disposition: A | Discharge status: Home / Self Care | Payer: PPO | Source: Ambulatory Visit | Attending: Hematology | Admitting: Hematology

## 2015-08-09 ENCOUNTER — Encounter (HOSPITAL_COMMUNITY): Payer: Self-pay

## 2015-08-09 ENCOUNTER — Ambulatory Visit: Payer: PPO

## 2015-08-09 DIAGNOSIS — C8330 Diffuse large B-cell lymphoma, unspecified site: Secondary | ICD-10-CM

## 2015-08-09 HISTORY — DX: Anesthesia of skin: R20.0

## 2015-08-09 LAB — CSF CELL COUNT WITH DIFFERENTIAL
RBC Count, CSF: 0 /mm3
Tube #: 1
WBC, CSF: 2 /mm3 (ref 0–5)

## 2015-08-09 LAB — GLUCOSE, CSF: GLUCOSE CSF: 78 mg/dL — AB (ref 40–70)

## 2015-08-09 LAB — PROTEIN, CSF: TOTAL PROTEIN, CSF: 29 mg/dL (ref 15–45)

## 2015-08-09 MED ORDER — METHOTREXATE SODIUM CHEMO INJECTION (PF) 50 MG/2ML
Freq: Once | INTRAMUSCULAR | Status: AC
  Administered 2015-08-09: 13:00:00 via INTRATHECAL
  Filled 2015-08-09: qty 0.48

## 2015-08-09 NOTE — Telephone Encounter (Signed)
Call from [facility], RN in short stay requesting orders for injection, they are unsure what time pt will be discharged after LP, will give Neulasta in short stay. Discussed with Dr. Irene Limbo: Neulasta should be rescheduled to D3. Do not give same day as IT MTX. Notified Tammy of appointment change, they will give pt AVS with appointment for injection 8/18.

## 2015-08-09 NOTE — Progress Notes (Signed)
Pt lying flat x 4 hours (1730) as ordered. Pt eating cheese/crackers;p-nut butter graham crackers and drinking water. Voices no c/o

## 2015-08-09 NOTE — Progress Notes (Signed)
Up to BR briefly to void. Ambulated with steady gait. Voices no c/o. Pad on bandaid at lumbar area is 2/3 saturated but no oozing noted

## 2015-08-09 NOTE — Progress Notes (Signed)
Up to BR again briefly. Bandaid on lumbar area replaced with fresh bandaid no bleeding noted. Then back to stretcher flat in be. Pt drinking water without problem

## 2015-08-09 NOTE — Discharge Instructions (Signed)
Lumbar Puncture, Care After °Refer to this sheet in the next few weeks. These instructions provide you with information on caring for yourself after your procedure. Your health care provider may also give you more specific instructions. Your treatment has been planned according to current medical practices, but problems sometimes occur. Call your health care provider if you have any problems or questions after your procedure. °WHAT TO EXPECT AFTER THE PROCEDURE °After your procedure, it is typical to have the following sensations: °· Mild discomfort or pain at the insertion site. °· Mild headache that is relieved with pain medicines. °HOME CARE INSTRUCTIONS °· Avoid lifting anything heavier than 10 lb (4.5 kg) for at least 12 hours after the procedure. °· Drink enough fluids to keep your urine clear or pale yellow. °SEEK MEDICAL CARE IF: °· You have fever or chills. °· You have nausea or vomiting. °· You have a headache that lasts for more than 2 days. °SEEK IMMEDIATE MEDICAL CARE IF: °· You have any numbness or tingling in your legs. °· You are unable to control your bowel or bladder. °· You have bleeding or swelling in your back at the insertion site. °· You are dizzy or faint. °Document Released: 12/14/2013 Document Reviewed: 12/14/2013 °ExitCare® Patient Information ©2015 [employer]. This information is not intended to replace advice given to you by your health care provider. Make sure you discuss any questions you have with your health care provider. ° °

## 2015-08-09 NOTE — Progress Notes (Signed)
While putting clothes on to go home, pt scratched his forehead.  There is a red mark, no break in skin.  Offered a bandaid, but pt declined.  Pt was also given his d/c instructions earlier from previous nurse, Marcie Bal.  Pt vss, afebile.  Pt d/c home with wife, wife is driving.  bandaid to lower back is dry and intact.

## 2015-08-09 NOTE — Procedures (Signed)
Lumbar puncture performed at L2-L3.  Opening pressure 17 cm H20.  6 ml CSF clear CSF collected and sent to laboratory for analysis per orders of primary team.  12 mg Methorexate in total volume of 10 mL injected slowly.  Needle removed.  No bleeding or other complications.  See full dictation in PACS.

## 2015-08-10 ENCOUNTER — Encounter: Payer: Self-pay | Admitting: Hematology

## 2015-08-10 ENCOUNTER — Ambulatory Visit: Payer: PPO

## 2015-08-10 ENCOUNTER — Ambulatory Visit (HOSPITAL_BASED_OUTPATIENT_CLINIC_OR_DEPARTMENT_OTHER): Payer: PPO

## 2015-08-10 ENCOUNTER — Telehealth: Payer: Self-pay | Admitting: *Deleted

## 2015-08-10 ENCOUNTER — Encounter: Payer: Self-pay | Admitting: *Deleted

## 2015-08-10 VITALS — BP 145/54 | HR 98 | Temp 97.9°F

## 2015-08-10 DIAGNOSIS — C8338 Diffuse large B-cell lymphoma, lymph nodes of multiple sites: Secondary | ICD-10-CM | POA: Diagnosis not present

## 2015-08-10 DIAGNOSIS — C8330 Diffuse large B-cell lymphoma, unspecified site: Secondary | ICD-10-CM

## 2015-08-10 MED ORDER — PEGFILGRASTIM INJECTION 6 MG/0.6ML ~~LOC~~
6.0000 mg | PREFILLED_SYRINGE | Freq: Once | SUBCUTANEOUS | Status: AC
  Administered 2015-08-10: 6 mg via SUBCUTANEOUS
  Filled 2015-08-10: qty 0.6

## 2015-08-10 NOTE — Progress Notes (Signed)
Visited patient during injection visit to follow up about first chemo.  Pt stating he is doing well, no N/V or other issues.  Pt knows to take claritin with neulasta injection.  Pt knows to call for any questions or concerns.  No other concerns at this time.

## 2015-08-10 NOTE — Telephone Encounter (Signed)
Ethan Stewart IN MANAGED CARE WILL CHECK WITH PT.'S INSURANCE COMPANY AND CALL PT. WITH AN UPDATE.

## 2015-08-10 NOTE — Progress Notes (Signed)
[facility] approved ondansetron under medicare part D, called [facility] on [address] and it costs $2, left message for patient.

## 2015-08-10 NOTE — Patient Instructions (Signed)
Pegfilgrastim injection What is this medicine? PEGFILGRASTIM (peg fil GRA stim) is a long-acting granulocyte colony-stimulating factor that stimulates the growth of neutrophils, a type of white blood cell important in the body's fight against infection. It is used to reduce the incidence of fever and infection in patients with certain types of cancer who are receiving chemotherapy that affects the bone marrow. This medicine may be used for other purposes; ask your health care provider or pharmacist if you have questions. COMMON BRAND NAME(S): Neulasta What should I tell my health care provider before I take this medicine? They need to know if you have any of these conditions: -latex allergy -ongoing radiation therapy -sickle cell disease -skin reactions to acrylic adhesives (On-Body Injector only) -an unusual or allergic reaction to pegfilgrastim, filgrastim, other medicines, foods, dyes, or preservatives -pregnant or trying to get pregnant -breast-feeding How should I use this medicine? This medicine is for injection under the skin. If you get this medicine at home, you will be taught how to prepare and give the pre-filled syringe or how to use the On-body Injector. Refer to the patient Instructions for Use for detailed instructions. Use exactly as directed. Take your medicine at regular intervals. Do not take your medicine more often than directed. It is important that you put your used needles and syringes in a special sharps container. Do not put them in a trash can. If you do not have a sharps container, call your pharmacist or healthcare provider to get one. Talk to your pediatrician regarding the use of this medicine in children. Special care may be needed. Overdosage: If you think you have taken too much of this medicine contact a poison control center or emergency room at once. NOTE: This medicine is only for you. Do not share this medicine with others. What if I miss a dose? It is  important not to miss your dose. Call your doctor or health care professional if you miss your dose. If you miss a dose due to an On-body Injector failure or leakage, a new dose should be administered as soon as possible using a single prefilled syringe for manual use. What may interact with this medicine? Interactions have not been studied. Give your health care provider a list of all the medicines, herbs, non-prescription drugs, or dietary supplements you use. Also tell them if you smoke, drink alcohol, or use illegal drugs. Some items may interact with your medicine. This list may not describe all possible interactions. Give your health care provider a list of all the medicines, herbs, non-prescription drugs, or dietary supplements you use. Also tell them if you smoke, drink alcohol, or use illegal drugs. Some items may interact with your medicine. What should I watch for while using this medicine? You may need blood work done while you are taking this medicine. If you are going to need a MRI, CT scan, or other procedure, tell your doctor that you are using this medicine (On-Body Injector only). What side effects may I notice from receiving this medicine? Side effects that you should report to your doctor or health care professional as soon as possible: -allergic reactions like skin rash, itching or hives, swelling of the face, lips, or tongue -dizziness -fever -pain, redness, or irritation at site where injected -pinpoint red spots on the skin -shortness of breath or breathing problems -stomach or side pain, or pain at the shoulder -swelling -tiredness -trouble passing urine Side effects that usually do not require medical attention (report to your doctor   or health care professional if they continue or are bothersome): -bone pain -muscle pain This list may not describe all possible side effects. Call your doctor for medical advice about side effects. You may report side effects to FDA at  1-800-FDA-1088. Where should I keep my medicine? Keep out of the reach of children. Store pre-filled syringes in a refrigerator between 2 and 8 degrees C (36 and 46 degrees F). Do not freeze. Keep in carton to protect from light. Throw away this medicine if it is left out of the refrigerator for more than 48 hours. Throw away any unused medicine after the expiration date. NOTE: This sheet is a summary. It may not cover all possible information. If you have questions about this medicine, talk to your doctor, pharmacist, or health care provider.  2015, Elsevier/Gold Standard. (2014-03-10 16:14:05)  

## 2015-08-12 ENCOUNTER — Ambulatory Visit: Payer: PPO

## 2015-08-15 ENCOUNTER — Other Ambulatory Visit (HOSPITAL_BASED_OUTPATIENT_CLINIC_OR_DEPARTMENT_OTHER): Payer: PPO

## 2015-08-15 ENCOUNTER — Ambulatory Visit (HOSPITAL_BASED_OUTPATIENT_CLINIC_OR_DEPARTMENT_OTHER): Payer: PPO | Admitting: Hematology

## 2015-08-15 ENCOUNTER — Encounter: Payer: Self-pay | Admitting: Hematology

## 2015-08-15 ENCOUNTER — Telehealth: Payer: Self-pay | Admitting: Hematology

## 2015-08-15 VITALS — BP 125/75 | HR 93 | Temp 98.2°F | Resp 18 | Ht 73.0 in | Wt 213.5 lb

## 2015-08-15 DIAGNOSIS — K2970 Gastritis, unspecified, without bleeding: Secondary | ICD-10-CM

## 2015-08-15 DIAGNOSIS — C8338 Diffuse large B-cell lymphoma, lymph nodes of multiple sites: Secondary | ICD-10-CM | POA: Diagnosis not present

## 2015-08-15 DIAGNOSIS — C8330 Diffuse large B-cell lymphoma, unspecified site: Secondary | ICD-10-CM

## 2015-08-15 DIAGNOSIS — D701 Agranulocytosis secondary to cancer chemotherapy: Secondary | ICD-10-CM

## 2015-08-15 DIAGNOSIS — K219 Gastro-esophageal reflux disease without esophagitis: Secondary | ICD-10-CM | POA: Diagnosis not present

## 2015-08-15 LAB — COMPREHENSIVE METABOLIC PANEL (CC13)
ALT: 35 U/L (ref 0–55)
AST: 15 U/L (ref 5–34)
Albumin: 2.8 g/dL — ABNORMAL LOW (ref 3.5–5.0)
Alkaline Phosphatase: 77 U/L (ref 40–150)
Anion Gap: 8 mEq/L (ref 3–11)
BILIRUBIN TOTAL: 0.67 mg/dL (ref 0.20–1.20)
BUN: 12.4 mg/dL (ref 7.0–26.0)
CO2: 24 meq/L (ref 22–29)
CREATININE: 0.8 mg/dL (ref 0.7–1.3)
Calcium: 9 mg/dL (ref 8.4–10.4)
Chloride: 106 mEq/L (ref 98–109)
EGFR: >90 mL/min/{1.73_m2} (ref >90)
GLUCOSE: 86 mg/dL (ref 70–140)
Potassium: 4.2 mEq/L (ref 3.5–5.1)
SODIUM: 138 meq/L (ref 136–145)
TOTAL PROTEIN: 5.8 g/dL — AB (ref 6.4–8.3)

## 2015-08-15 LAB — CBC & DIFF AND RETIC
BASO%: 3.8 % — AB (ref 0.0–2.0)
Basophils Absolute: 0 10*3/uL (ref 0.0–0.1)
EOS%: 1.9 % (ref 0.0–7.0)
Eosinophils Absolute: 0 10*3/uL (ref 0.0–0.5)
HCT: 30.5 % — ABNORMAL LOW (ref 38.4–49.9)
HGB: 10.5 g/dL — ABNORMAL LOW (ref 13.0–17.1)
IMMATURE RETIC FRACT: 15.4 % — AB (ref 3.00–10.60)
LYMPH#: 0.3 10*3/uL — AB (ref 0.9–3.3)
LYMPH%: 62.7 % — ABNORMAL HIGH (ref 14.0–49.0)
MCH: 29.8 pg (ref 27.2–33.4)
MCHC: 34.4 g/dL (ref 32.0–36.0)
MCV: 86.7 fL (ref 79.3–98.0)
MONO#: 0.1 10*3/uL (ref 0.1–0.9)
MONO%: 14.3 % — ABNORMAL HIGH (ref 0.0–14.0)
NEUT%: 17.3 % — AB (ref 39.0–75.0)
NEUTROS ABS: 0.1 10*3/uL — AB (ref 1.5–6.5)
PLATELETS: 154 10*3/uL (ref 140–400)
RBC: 3.52 10*6/uL — AB (ref 4.20–5.82)
RDW: 15.2 % — ABNORMAL HIGH (ref 11.0–14.6)
RETIC CT ABS: 12.32 10*3/uL — AB (ref 34.80–93.90)
WBC: 0.5 10*3/uL — CL (ref 4.0–10.3)

## 2015-08-15 LAB — MAGNESIUM (CC13): Magnesium: 2 mg/dl (ref 1.5–2.5)

## 2015-08-15 LAB — PHOSPHORUS: Phosphorus: 2.4 mg/dL (ref 2.1–4.3)

## 2015-08-15 LAB — LACTATE DEHYDROGENASE (CC13): LDH: 267 U/L — ABNORMAL HIGH (ref 125–245)

## 2015-08-15 MED ORDER — LEVOFLOXACIN 750 MG PO TABS: 750.0000 mg | ORAL_TABLET | Freq: Every day | ORAL | Status: DC

## 2015-08-15 NOTE — Progress Notes (Signed)
Due to neutropenia, PAC placement rescheduled to 9/6 @0830 .  Pt called and made aware of appointment.  Pt instructed to be NPO after midnight, pt verbalized understanding.

## 2015-08-15 NOTE — Telephone Encounter (Signed)
Gave adn printed appt sched and avs for pt for Aug and Sept °

## 2015-08-16 ENCOUNTER — Inpatient Hospital Stay (HOSPITAL_COMMUNITY): Admission: RE | Admit: 2015-08-16 | Payer: PPO | Source: Ambulatory Visit

## 2015-08-16 ENCOUNTER — Ambulatory Visit (HOSPITAL_COMMUNITY): Admission: RE | Admit: 2015-08-16 | Payer: PPO | Source: Ambulatory Visit

## 2015-08-17 ENCOUNTER — Other Ambulatory Visit: Payer: Self-pay | Admitting: Hematology

## 2015-08-17 ENCOUNTER — Encounter (HOSPITAL_COMMUNITY): Payer: Self-pay

## 2015-08-18 NOTE — Progress Notes (Signed)
[facility]  HEMATOLOGY ONCOLOGY PROGRESS NOTE Date of service 08/15/2015  Patient Care Team: Lona Kettle, MD as PCP - General (Family Medicine) Brunetta Genera, MD as Consulting Physician (Hematology)  SUMMARY OF ONCOLOGIC HISTORY:   DLBCL (diffuse large B cell lymphoma)   06/21/2015 Imaging CT Chest/abd/pelvis: IMPRESSION: 5 mm nodule within the right lower lobe as described.  Pneumobilia consistent with a prior sphincterotomy.  No acute abnormality is identified. No findings to suggest chest etiology for the known lesion at C5 are seen.   06/21/2015 Imaging MRI c spine1. Diffuse marrow replacement of the C5 vertebral body highly concerning for neoplasm (metastatic disease, myeloma, or lymphoma). Epidural tumor at this level results in moderate spinal stenosis with moderate impression on the spinal cord    07/10/2015 PET scan 1. The previously demonstrated abnormal C5 vertebral body is hypermetabolic. No other osseous lesions demonstrated. 2. There are small hypermetabolic lymph nodes within the left mesentery and both inguinal regions.    07/26/2015 Initial Biopsy Had a C5 corpectomy pathology showed diffuse large B-cell lymphoma   08/08/2015 -  Chemotherapy Started for cycle of R CHOP.    08/09/2015 -  Chemotherapy Received first cycle of intrathecal methotrexate plus hydrocortisone for CNS prophylaxis.   Diagnosis: Diffuse large B-cell lymphoma Stage IVAE with extranodal involvement of C5 vertebra status post C5 corpectomy   INTERVAL HISTORY:  Mr. Brandow is here for his scheduled 1 week follow-up post first cycle of R CHOP and intrathecal methotrexate for toxicity check and labs. He notes no acute new symptoms. No fevers or chills. No mouth sores. Feeling well. Notes that he has been set up for physical therapy for his right upper extremity. Still notes that upper extremity abduction weakness consistent with C5-6 nerve root compression. No new uncontrolled pain. He is glad to be of steroids as  sleeping better.  REVIEW OF SYSTEMS:   10 point review of systems was done and is negative except as noted above  I have reviewed the past medical history, past surgical history, social history and family history with the patient and they are unchanged from previous note.  ALLERGIES:  has No Known Allergies.  MEDICATIONS:  Current Outpatient Prescriptions  Medication Sig Dispense Refill  . acetaminophen (TYLENOL) 500 MG tablet Take 1,000 mg by mouth every 6 (six) hours as needed.    [facility] allopurinol (ZYLOPRIM) 300 MG tablet Take 1 tablet (300 mg total) by mouth daily. 30 tablet 3  . diazepam (VALIUM) 5 MG tablet Take 5 mg by mouth every 6 (six) hours as needed for anxiety.     [facility] HYDROcodone-acetaminophen (NORCO/VICODIN) 5-325 MG per tablet Take 1 tablet by mouth every 4 (four) hours as needed (mild pain). 30 tablet 0  . lidocaine-prilocaine (EMLA) cream Apply to affected area once 30 g 3  . LORazepam (ATIVAN) 0.5 MG tablet Take 1 tablet (0.5 mg total) by mouth every 6 (six) hours as needed (Nausea or vomiting). 30 tablet 0  . Multiple Vitamin (MULTIVITAMIN WITH MINERALS) TABS tablet Take 1 tablet by mouth daily.    [facility] omeprazole (PRILOSEC) 40 MG capsule Take 1 capsule (40 mg total) by mouth daily. 30 capsule 0  . ondansetron (ZOFRAN) 8 MG tablet Take 1 tablet (8 mg total) by mouth 2 (two) times daily. Start the day after chemo for 3 days. Then as needed for nausea or vomiting. 30 tablet 1  . predniSONE (DELTASONE) 20 MG tablet Take 4.5 tablets (90 mg total) by mouth daily. Take on days 1-5  of chemotherapy. 30 tablet 0  . senna-docusate (SENNA S) 8.6-50 MG per tablet Take 2 tablets by mouth at bedtime. 60 tablet 1  . simvastatin (ZOCOR) 20 MG tablet Take 20 mg by mouth daily.    . temazepam (RESTORIL) 15 MG capsule Take 15 mg by mouth at bedtime as needed for sleep (sleep).    [facility] levofloxacin (LEVAQUIN) 750 MG tablet Take 1 tablet (750 mg total) by mouth daily. IF fever develops while neutropenic.  Call doctor immediately. 7 tablet 0  . prochlorperazine (COMPAZINE) 10 MG tablet Take 1 tablet (10 mg total) by mouth every 6 (six) hours as needed (Nausea or vomiting). (Patient not taking: Reported on 08/15/2015) 30 tablet 6   No current facility-administered medications for this visit.    PHYSICAL EXAMINATION: ECOG PERFORMANCE STATUS: 1 - Symptomatic but completely ambulatory  Filed Vitals:   08/15/15 0842  BP: 125/75  Pulse: 93  Temp: 98.2 F (36.8 C)  Resp: 18   Filed Weights   08/15/15 0836 08/15/15 0842  Weight: 213 lb 8 oz (96.843 kg) 213 lb 8 oz (96.843 kg)    GENERAL:alert, no distress and comfortable SKIN: skin color, texture, turgor are normal, no rashes or significant lesions EYES: normal, Conjunctiva are pink and non-injected, sclera clear OROPHARYNX:no exudate, no erythema and lips, buccal mucosa, and tongue normal  NECK: supple, thyroid normal size, non-tender, without nodularity LYMPH: small palpable left inguinal adenopathy. LUNGS: clear to auscultation and percussion with normal breathing effort HEART: regular rate & rhythm and no murmurs and no lower extremity edema ABDOMEN:abdomen soft, non-tender and normal bowel sounds Musculoskeletal:no cyanosis of digits and no clubbing  NEURO: alert & oriented x 3 with fluent speech, 4-/5 weakness right upper extremity abduction  LABORATORY DATA:   . CBC Latest Ref Rng 08/15/2015 08/07/2015 08/07/2015  WBC 4.0 - 10.3 10e3/uL 0.5(LL) 8.0 7.0  Hemoglobin 13.0 - 17.1 g/dL 10.5(L) 12.2(L) 12.2(L)  Hematocrit 38.4 - 49.9 % 30.5(L) 36.7(L) 35.5(L)  Platelets 140 - 400 10e3/uL 154 144(L) 178    . CMP Latest Ref Rng 08/15/2015 08/07/2015 08/07/2015  Glucose 70 - 140 mg/dl 86 90 137  BUN 7.0 - 26.0 mg/dL 12.4 18 19.0  Creatinine 0.7 - 1.3 mg/dL 0.8 0.70 0.8  Sodium 136 - 145 mEq/L 138 134(L) 136  Potassium 3.5 - 5.1 mEq/L 4.2 4.2 4.4  Chloride 101 - 111 mmol/L - 102 -  CO2 22 - 29 mEq/L _0 Calcium 8.4 - 10.4 mg/dL  9.0 8.7(L) 8.9  Total Protein 6.4 - 8.3 g/dL 5.8(L) 5.8(L) -  Total Bilirubin 0.20 - 1.20 mg/dL 0.67 0.53 -  Alkaline Phos 40 - 150 U/L 77 79 -  AST 5 - 34 U/L 15 28 -  ALT 0 - 55 U/L 35 57(H) -   . Lab Results  Component Value Date   LDH 267* 08/15/2015    RADIOGRAPHIC STUDIES: I have personally reviewed the radiological images as listed and agreed with the findings in the report. No results found.   ASSESSMENT & PLAN:   1) Diffuse large B-cell lymphoma stage IV AE with involvement of mesenteric, inguinal lymph nodes and C5 vertebral body with spinal cord impingement.  He had a C5 corpectomy on 07/26/2015 that showed diffuse large B-cell lymphoma with a Ki-67 ranging from 20 to 90%. Plan Patient received first cycle of R CHOP on 08/08/2015 and intrathecal methotrexate with hydrocortisone on 08/09/2015. Notes no nausea or discomfort. Order to the intrathecal treatment well with no  acute concerns. Labs today show significant neutropenia without any fever. No other new focal concerns. Did receive a Neulasta shot and anticipate that his white counts will start improving soon. Plan -Patient counseled on neutropenic precautions -Will need weekly labs -Recommended staying away from sick contacts. -We will decide on timing of the second dose of intrathecal methotrexate based on repeat labs in 1 week and clearance of neutropenia. -His port placement has been rescheduled due to his significant neutropenia.  2)Acid reflux and gastritis likely related to steroid use -controlled -Continue PPI  3) neutropenia without fevers due to chemotherapy. Patient has received Neulasta support. Plan -Neutropenic precautions -Patient counseled in detail to report immediately any fevers more than 100.4 or any focal symptoms suggestive of infection. -He has been given a prescription for levofloxacin to be able to take the first dose as soon as possible if a fever arises and then to immediately call the  on-call oncologist/go the emergency room.  I spent 20 minutes counseling the patient face to face. The total time spent in the appointment was 25 minutes and more than 50% was on counseling and review of test results, he will therapy counseling, discussion of treatment plan, visiting the patient in the emergency room and discussion with emergency room physician.    Sullivan Lone MD [place] [facility] Hematology/Oncology Physician [facility]  (Office):       [phone] (Work cell):  [phone] (Fax):           [phone]

## 2015-08-19 ENCOUNTER — Emergency Department (HOSPITAL_COMMUNITY)
Admission: EM | Admit: 2015-08-19 | Discharge: 2015-08-19 | Disposition: A | Discharge status: Home / Self Care | Payer: Non-veteran care | Source: Home / Self Care | Attending: Emergency Medicine | Admitting: Emergency Medicine

## 2015-08-19 ENCOUNTER — Emergency Department (HOSPITAL_COMMUNITY): Payer: Non-veteran care

## 2015-08-19 ENCOUNTER — Encounter (HOSPITAL_COMMUNITY): Payer: Self-pay | Admitting: Emergency Medicine

## 2015-08-19 DIAGNOSIS — R509 Fever, unspecified: Secondary | ICD-10-CM | POA: Diagnosis not present

## 2015-08-19 DIAGNOSIS — A419 Sepsis, unspecified organism: Secondary | ICD-10-CM | POA: Diagnosis not present

## 2015-08-19 DIAGNOSIS — Z87898 Personal history of other specified conditions: Secondary | ICD-10-CM

## 2015-08-19 LAB — CBC WITH DIFFERENTIAL/PLATELET
Basophils Absolute: 0 10*3/uL (ref 0.0–0.1)
Basophils Relative: 0 % (ref 0–1)
EOS PCT: 0 % (ref 0–5)
Eosinophils Absolute: 0 10*3/uL (ref 0.0–0.7)
HEMATOCRIT: 27.5 % — AB (ref 39.0–52.0)
Hemoglobin: 9.2 g/dL — ABNORMAL LOW (ref 13.0–17.0)
LYMPHS ABS: 0.5 10*3/uL — AB (ref 0.7–4.0)
Lymphocytes Relative: 5 % — ABNORMAL LOW (ref 12–46)
MCH: 28.8 pg (ref 26.0–34.0)
MCHC: 33.5 g/dL (ref 30.0–36.0)
MCV: 85.9 fL (ref 78.0–100.0)
MONOS PCT: 4 % (ref 3–12)
Monocytes Absolute: 0.4 10*3/uL (ref 0.1–1.0)
NEUTROS PCT: 91 % — AB (ref 43–77)
Neutro Abs: 9.3 10*3/uL — ABNORMAL HIGH (ref 1.7–7.7)
Platelets: 173 10*3/uL (ref 150–400)
RBC: 3.2 MIL/uL — AB (ref 4.22–5.81)
RDW: 16.6 % — ABNORMAL HIGH (ref 11.5–15.5)
WBC: 10.2 10*3/uL (ref 4.0–10.5)

## 2015-08-19 LAB — COMPREHENSIVE METABOLIC PANEL
ALT: 35 U/L (ref 17–63)
AST: 48 U/L — AB (ref 15–41)
Albumin: 2.5 g/dL — ABNORMAL LOW (ref 3.5–5.0)
Alkaline Phosphatase: 118 U/L (ref 38–126)
Anion gap: 7 (ref 5–15)
BILIRUBIN TOTAL: 0.6 mg/dL (ref 0.3–1.2)
BUN: 15 mg/dL (ref 6–20)
CO2: 24 mmol/L (ref 22–32)
CREATININE: 0.87 mg/dL (ref 0.61–1.24)
Calcium: 8.2 mg/dL — ABNORMAL LOW (ref 8.9–10.3)
Chloride: 106 mmol/L (ref 101–111)
GFR calc Af Amer: >60 mL/min (ref >60)
Glucose, Bld: 103 mg/dL — ABNORMAL HIGH (ref 65–99)
Potassium: 3.7 mmol/L (ref 3.5–5.1)
Sodium: 137 mmol/L (ref 135–145)
TOTAL PROTEIN: 5.8 g/dL — AB (ref 6.5–8.1)

## 2015-08-19 LAB — URINALYSIS, ROUTINE W REFLEX MICROSCOPIC
Bilirubin Urine: NEGATIVE
GLUCOSE, UA: NEGATIVE mg/dL
HGB URINE DIPSTICK: NEGATIVE
KETONES UR: 15 mg/dL — AB
Leukocytes, UA: NEGATIVE
Nitrite: NEGATIVE
PROTEIN: 30 mg/dL — AB
Specific Gravity, Urine: 1.044 — ABNORMAL HIGH (ref 1.005–1.030)
Urobilinogen, UA: 1 mg/dL (ref 0.0–1.0)
pH: 5.5 (ref 5.0–8.0)

## 2015-08-19 LAB — URINE MICROSCOPIC-ADD ON

## 2015-08-19 NOTE — ED Provider Notes (Signed)
CSN: [id number]     Arrival date & time 08/19/15  4497 History   First MD Initiated Contact with Patient 08/19/15 [phone]     Chief Complaint  Patient presents with  . chemo card   . Fever     (Consider location/radiation/quality/duration/timing/severity/associated sxs/prior Treatment) HPI Ethan VANDERSCHAAF is a 69 y.o. male with recently diagnosed non-Hodgkin's lymphoma, comes in for evaluation of fever. Patient states this morning he was recording his temperatures and noted his temperature to be 100.161F. He denies feeling ill and states "I feel the best that I felt a long time". However, he does report taking two 500 mg Tylenol tablets approximately 1.5 hours prior to arrival. Denies headache, chest pain, shortness of breath, cough, nausea or vomiting, abdominal pain. No other aggravating or modifying factors. Patient reports that he is scheduled to have basic labs drawn next Thursday, will see his oncologist on September 8. Patient does mention that he thinks his right knee is more red than normal. Denies any injury. Patient also reports that he has a prescription for Levaquin given to him by his oncologist in the event that he has a temperature over 100.61F  Past Medical History  Diagnosis Date  . History of bleeding ulcers 1990's  . 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 Anxiety   . GERD (gastroesophageal reflux disease)   . H. pylori infection   . History of blood transfusion   . Bone cancer      tumor on C5  . Arm numbness 08/07/15    Limited movement due to tumor on spine   Past Surgical History  Procedure Laterality Date  . Hernia repair Bilateral     with mesh  . Colonoscopy    . Eye surgery Right     catheter  . Anterior cervical corpectomy N/A 07/26/2015    Procedure: Cervical five Corpectomy/Cervical four-six Fusion/Plate ;  Surgeon: Eustace Moore, MD;  Location: [place] [facility];  Service: Neurosurgery;  Laterality: N/A;  C5 Corpectomy/C4-6 Fusion/Plate C4-6   Family History   Problem Relation Age of Onset  . Hypertension Mother    Social History  Substance Use Topics  . Smoking status: Former Smoker -- 1.00 packs/day for 10 years    Types: Cigarettes    Quit date: 12/23/1981  . Smokeless tobacco: Never Used  . Alcohol Use: 3.0 oz/week    2 Glasses of wine, 3 Cans of beer per week    Review of Systems A 10 point review of systems was completed and was negative except for pertinent positives and negatives as mentioned in the history of present illness     Allergies  Review of patient's allergies indicates no known allergies.  Home Medications   Prior to Admission medications   Medication Sig Start Date End Date Taking? Authorizing Provider  acetaminophen (TYLENOL) 500 MG tablet Take 1,000 mg by mouth every 6 (six) hours as needed for mild pain, fever or headache.    Yes Historical Provider, MD  allopurinol (ZYLOPRIM) 300 MG tablet Take 1 tablet (300 mg total) by mouth daily. 08/08/15  Yes Brunetta Genera, MD  diazepam (VALIUM) 5 MG tablet Take 5 mg by mouth every 6 (six) hours as needed for anxiety.    Yes Historical Provider, MD  HYDROcodone-acetaminophen (NORCO/VICODIN) 5-325 MG per tablet Take 1 tablet by mouth every 4 (four) hours as needed (mild pain). 07/27/15  Yes Eustace Moore, MD  LORazepam (ATIVAN) 0.5 MG tablet Take 1 tablet (0.5 mg total) by  mouth every 6 (six) hours as needed (Nausea or vomiting). 08/08/15  Yes Brunetta Genera, MD  Multiple Vitamin (MULTIVITAMIN WITH MINERALS) TABS tablet Take 1 tablet by mouth daily.   Yes Historical Provider, MD  omeprazole (PRILOSEC) 40 MG capsule Take 1 capsule (40 mg total) by mouth daily. 07/24/15  Yes Brunetta Genera, MD  predniSONE (DELTASONE) 20 MG tablet Take 4.5 tablets (90 mg total) by mouth daily. Take on days 1-5 of chemotherapy. 08/08/15  Yes Gautam Juleen China, MD  [facility]   Yes Historical Provider, MD  senna-docusate (SENNA S) 8.6-50 MG per tablet Take 2  tablets by mouth at bedtime. 08/07/15  Yes Brunetta Genera, MD  simvastatin (ZOCOR) 20 MG tablet Take 20 mg by mouth daily.   Yes Historical Provider, MD  temazepam (RESTORIL) 15 MG capsule Take 15 mg by mouth at bedtime as needed for sleep (sleep).   Yes Historical Provider, MD  levofloxacin (LEVAQUIN) 750 MG tablet Take 1 tablet (750 mg total) by mouth daily. IF fever develops while neutropenic. Call doctor immediately. Patient not taking: Reported on 08/19/2015 08/15/15   Brunetta Genera, MD  lidocaine-prilocaine (EMLA) cream Apply to affected area once 08/08/15   Brunetta Genera, MD  ondansetron (ZOFRAN) 8 MG tablet Take 1 tablet (8 mg total) by mouth 2 (two) times daily. Start the day after chemo for 3 days. Then as needed for nausea or vomiting. 08/08/15   Brunetta Genera, MD  prochlorperazine (COMPAZINE) 10 MG tablet Take 1 tablet (10 mg total) by mouth every 6 (six) hours as needed (Nausea or vomiting). 08/08/15   Brunetta Genera, MD   BP 137/65 mmHg  Pulse 87  Temp(Src) 98.7 F (37.1 C) (Oral)  Resp 18  SpO2 92% Physical Exam  Constitutional: He is oriented to person, place, and time. He appears well-developed and well-nourished.  HENT:  Head: Normocephalic and atraumatic.  Mouth/Throat: Oropharynx is clear and moist.  Eyes: Conjunctivae are normal. Pupils are equal, round, and reactive to light. Right eye exhibits no discharge. Left eye exhibits no discharge. No scleral icterus.  Neck: Neck supple.  Cardiovascular: Normal rate, regular rhythm and normal heart sounds.   No tachycardia on my exam  Pulmonary/Chest: Effort normal and breath sounds normal. No respiratory distress. He has no wheezes. He has no rales.  Abdominal: Soft. There is no tenderness.  Musculoskeletal: He exhibits no tenderness.  Right knee is slightly more erythematous than left with associated warmth. Maintains full active range of motion of right knee, no streaking or tenderness along the venous  system. No obvious effusion. No unilateral leg swelling. No calf pain, negative Homans sign bilaterally.  Neurological: He is alert and oriented to person, place, and time.  Cranial Nerves II-XII grossly intact  Skin: Skin is warm and dry. No rash noted.  Psychiatric: He has a normal mood and affect.  Nursing note and vitals reviewed.   ED Course  Procedures (including critical care time) Labs Review Labs Reviewed  CBC WITH DIFFERENTIAL/PLATELET - Abnormal; Notable for the following:    RBC 3.20 (*)    Hemoglobin 9.2 (*)    HCT 27.5 (*)    RDW 16.6 (*)    Neutrophils Relative % 91 (*)    Lymphocytes Relative 5 (*)    Neutro Abs 9.3 (*)    Lymphs Abs 0.5 (*)    All other components within normal limits  COMPREHENSIVE METABOLIC PANEL - Abnormal; Notable for the following:  Glucose, Bld 103 (*)    Calcium 8.2 (*)    Total Protein 5.8 (*)    Albumin 2.5 (*)    AST 48 (*)    All other components within normal limits  URINALYSIS, ROUTINE W REFLEX MICROSCOPIC (NOT AT [facility]) - Abnormal; Notable for the following:    Color, Urine AMBER (*)    APPearance CLOUDY (*)    Specific Gravity, Urine 1.044 (*)    Ketones, ur 15 (*)    Protein, ur 30 (*)    All other components within normal limits  URINE MICROSCOPIC-ADD ON - Abnormal; Notable for the following:    Bacteria, UA FEW (*)    All other components within normal limits    Imaging Review Dg Chest 2 View  08/19/2015   CLINICAL DATA:  Fever.  History of bone cancer.  EXAM: CHEST  2 VIEW  COMPARISON:  PET-CT - 07/10/2015; chest CT - 06/11/2015  FINDINGS: Grossly unchanged cardiac silhouette and mediastinal contours. There is mild diffuse slightly nodular thickening of the pulmonary arch ischium, most conspicuous within the peripheral aspects of the bilateral upper/ mid lungs. Minimal bibasilar heterogeneous opacities favored to represent atelectasis or scar. No discrete focal airspace opacities. No pleural effusion or pneumothorax. No  no acute osseous abnormalities. Post lower cervical ACDF, incompletely evaluated.  IMPRESSION: Findings suggestive of airways disease / bronchitis. No focal airspace opacities to suggest pneumonia.   Electronically Signed   By: Sandi Mariscal M.D.   On: 08/19/2015 09:24   I have personally reviewed and evaluated these images and lab results as part of my medical decision-making.   EKG Interpretation None     Meds given in ED:  Medications - No data to display  Discharge Medication List as of 08/19/2015 10:59 AM     Filed Vitals:   08/19/15 0824 08/19/15 0848 08/19/15 1044 08/19/15 1057  BP: 127/64 129/63 137/65   Pulse: 106 94 87   Temp: 98.3 F (36.8 C) 98.4 F (36.9 C)  98.7 F (37.1 C)  TempSrc: Oral Oral  Oral  Resp: 18 18 18    SpO2: 95% 92% 88% 92%    MDM  Vitals stable - WNL -afebrile Pt resting comfortably in ED. PE--this exam as above and not concerning for emergent pathology. Potentially mild bursitis and right knee. Labwork--labs are reassuring. No neutropenia. Patient does show evidence of mild dehydration with elevated urine specific gravity. Imaging--chest x-ray shows no acute focal consolidations.  Patient denies any symptoms or discomfort whatsoever in the ED. Low suspicion for infectious or emergent pathology at this time. Patient stable for discharge follow-up with PCP next week. Admits he has not been drinking as much water as he normally does. We discussed the importance of regular hydration and patient agrees. I discussed all relevant lab findings and imaging results with pt and they verbalized understanding. Discussed f/u with PCP within 48 hrs and return precautions, pt very amenable to plan. Prior to patient discharge, I discussed and reviewed this case with Dr. Ashok Cordia, who agrees with above plan.   Final diagnoses:  History of fever        Comer Locket, PA-C 08/20/15 2633  Lajean Saver, MD 08/20/15 [phone]

## 2015-08-19 NOTE — Discharge Instructions (Signed)
You were evaluated in the ED today for your history of fever. He remained afebrile in the ED. Your labs and exam are reassuring. There is no evidence of urine or lung infection at this time. Please follow-up with your doctors as needed for reevaluation. Return to ED for new or worsening symptoms.

## 2015-08-19 NOTE — ED Notes (Signed)
Here due to fever.  Began this morning about 7am 100.7-100.8.  He is feeling well but was told to come to the Emergency Room if his temperature goes above 100.5.  He is alert and oriented x 4.  Denies NVD.  He had a headache yesterday, but none today.  He states he feels well.

## 2015-08-19 NOTE — ED Notes (Signed)
Delay in collecting labs due to patient being in xray

## 2015-08-19 NOTE — ED Notes (Signed)
This RN went to room to review discharge papers with patient.  Patient and spouse had already left hospital.  (No rx)  Will hold dc papers temporarily in case they return

## 2015-08-19 NOTE — ED Notes (Signed)
Pt states that he had fever this morning.  Pt took Tylenol around hour and half ago. Pt states that he dont understand bc this morning is the best he has felt.  Pt non-Hodkins lymphoma.

## 2015-08-20 ENCOUNTER — Emergency Department (HOSPITAL_COMMUNITY): Payer: Non-veteran care

## 2015-08-20 ENCOUNTER — Encounter (HOSPITAL_COMMUNITY): Payer: Self-pay | Admitting: Nurse Practitioner

## 2015-08-20 ENCOUNTER — Inpatient Hospital Stay (HOSPITAL_COMMUNITY)
Admission: EM | Admit: 2015-08-20 | Discharge: 2015-09-05 | DRG: 870 | Disposition: A | Discharge status: Skilled Nursing Facility | Payer: Non-veteran care | Attending: Internal Medicine | Admitting: Internal Medicine

## 2015-08-20 DIAGNOSIS — E43 Unspecified severe protein-calorie malnutrition: Secondary | ICD-10-CM | POA: Insufficient documentation

## 2015-08-20 DIAGNOSIS — D6481 Anemia due to antineoplastic chemotherapy: Secondary | ICD-10-CM | POA: Diagnosis present

## 2015-08-20 DIAGNOSIS — T17990A Other foreign object in respiratory tract, part unspecified in causing asphyxiation, initial encounter: Secondary | ICD-10-CM | POA: Diagnosis not present

## 2015-08-20 DIAGNOSIS — R740 Nonspecific elevation of levels of transaminase and lactic acid dehydrogenase [LDH]: Secondary | ICD-10-CM | POA: Diagnosis present

## 2015-08-20 DIAGNOSIS — Z79899 Other long term (current) drug therapy: Secondary | ICD-10-CM

## 2015-08-20 DIAGNOSIS — T451X5A Adverse effect of antineoplastic and immunosuppressive drugs, initial encounter: Secondary | ICD-10-CM | POA: Diagnosis present

## 2015-08-20 DIAGNOSIS — R000 Tachycardia, unspecified: Secondary | ICD-10-CM | POA: Diagnosis present

## 2015-08-20 DIAGNOSIS — L8990 Pressure ulcer of unspecified site, unspecified stage: Secondary | ICD-10-CM | POA: Insufficient documentation

## 2015-08-20 DIAGNOSIS — R532 Functional quadriplegia: Secondary | ICD-10-CM | POA: Diagnosis present

## 2015-08-20 DIAGNOSIS — T797XXA Traumatic subcutaneous emphysema, initial encounter: Secondary | ICD-10-CM | POA: Diagnosis not present

## 2015-08-20 DIAGNOSIS — K810 Acute cholecystitis: Secondary | ICD-10-CM

## 2015-08-20 DIAGNOSIS — E876 Hypokalemia: Secondary | ICD-10-CM | POA: Insufficient documentation

## 2015-08-20 DIAGNOSIS — Z79891 Long term (current) use of opiate analgesic: Secondary | ICD-10-CM

## 2015-08-20 DIAGNOSIS — R410 Disorientation, unspecified: Secondary | ICD-10-CM | POA: Diagnosis present

## 2015-08-20 DIAGNOSIS — J189 Pneumonia, unspecified organism: Secondary | ICD-10-CM | POA: Diagnosis present

## 2015-08-20 DIAGNOSIS — E46 Unspecified protein-calorie malnutrition: Secondary | ICD-10-CM | POA: Diagnosis present

## 2015-08-20 DIAGNOSIS — R945 Abnormal results of liver function studies: Secondary | ICD-10-CM | POA: Insufficient documentation

## 2015-08-20 DIAGNOSIS — I10 Essential (primary) hypertension: Secondary | ICD-10-CM | POA: Diagnosis present

## 2015-08-20 DIAGNOSIS — R05 Cough: Secondary | ICD-10-CM | POA: Diagnosis present

## 2015-08-20 DIAGNOSIS — IMO0001 Reserved for inherently not codable concepts without codable children: Secondary | ICD-10-CM | POA: Insufficient documentation

## 2015-08-20 DIAGNOSIS — E870 Hyperosmolality and hypernatremia: Secondary | ICD-10-CM | POA: Diagnosis present

## 2015-08-20 DIAGNOSIS — X58XXXA Exposure to other specified factors, initial encounter: Secondary | ICD-10-CM | POA: Diagnosis not present

## 2015-08-20 DIAGNOSIS — R1314 Dysphagia, pharyngoesophageal phase: Secondary | ICD-10-CM | POA: Insufficient documentation

## 2015-08-20 DIAGNOSIS — K219 Gastro-esophageal reflux disease without esophagitis: Secondary | ICD-10-CM | POA: Diagnosis present

## 2015-08-20 DIAGNOSIS — E860 Dehydration: Secondary | ICD-10-CM | POA: Diagnosis present

## 2015-08-20 DIAGNOSIS — Z87891 Personal history of nicotine dependence: Secondary | ICD-10-CM

## 2015-08-20 DIAGNOSIS — M4800 Spinal stenosis, site unspecified: Secondary | ICD-10-CM | POA: Diagnosis present

## 2015-08-20 DIAGNOSIS — Z9221 Personal history of antineoplastic chemotherapy: Secondary | ICD-10-CM

## 2015-08-20 DIAGNOSIS — J9601 Acute respiratory failure with hypoxia: Secondary | ICD-10-CM | POA: Insufficient documentation

## 2015-08-20 DIAGNOSIS — Z8249 Family history of ischemic heart disease and other diseases of the circulatory system: Secondary | ICD-10-CM

## 2015-08-20 DIAGNOSIS — A419 Sepsis, unspecified organism: Principal | ICD-10-CM | POA: Diagnosis present

## 2015-08-20 DIAGNOSIS — Y95 Nosocomial condition: Secondary | ICD-10-CM | POA: Diagnosis present

## 2015-08-20 DIAGNOSIS — T458X5A Adverse effect of other primarily systemic and hematological agents, initial encounter: Secondary | ICD-10-CM | POA: Diagnosis not present

## 2015-08-20 DIAGNOSIS — D492 Neoplasm of unspecified behavior of bone, soft tissue, and skin: Secondary | ICD-10-CM | POA: Diagnosis present

## 2015-08-20 DIAGNOSIS — I248 Other forms of acute ischemic heart disease: Secondary | ICD-10-CM | POA: Diagnosis present

## 2015-08-20 DIAGNOSIS — R7989 Other specified abnormal findings of blood chemistry: Secondary | ICD-10-CM

## 2015-08-20 DIAGNOSIS — Z6825 Body mass index (BMI) 25.0-25.9, adult: Secondary | ICD-10-CM

## 2015-08-20 DIAGNOSIS — J80 Acute respiratory distress syndrome: Secondary | ICD-10-CM | POA: Insufficient documentation

## 2015-08-20 DIAGNOSIS — T819XXD Unspecified complication of procedure, subsequent encounter: Secondary | ICD-10-CM

## 2015-08-20 DIAGNOSIS — R739 Hyperglycemia, unspecified: Secondary | ICD-10-CM | POA: Diagnosis present

## 2015-08-20 DIAGNOSIS — J9600 Acute respiratory failure, unspecified whether with hypoxia or hypercapnia: Secondary | ICD-10-CM | POA: Insufficient documentation

## 2015-08-20 DIAGNOSIS — D649 Anemia, unspecified: Secondary | ICD-10-CM

## 2015-08-20 DIAGNOSIS — F419 Anxiety disorder, unspecified: Secondary | ICD-10-CM | POA: Clinically undetermined

## 2015-08-20 DIAGNOSIS — Z9889 Other specified postprocedural states: Secondary | ICD-10-CM

## 2015-08-20 DIAGNOSIS — D899 Disorder involving the immune mechanism, unspecified: Secondary | ICD-10-CM | POA: Diagnosis present

## 2015-08-20 DIAGNOSIS — C8330 Diffuse large B-cell lymphoma, unspecified site: Secondary | ICD-10-CM | POA: Diagnosis present

## 2015-08-20 DIAGNOSIS — Z4659 Encounter for fitting and adjustment of other gastrointestinal appliance and device: Secondary | ICD-10-CM

## 2015-08-20 DIAGNOSIS — R0902 Hypoxemia: Secondary | ICD-10-CM | POA: Insufficient documentation

## 2015-08-20 DIAGNOSIS — E8809 Other disorders of plasma-protein metabolism, not elsewhere classified: Secondary | ICD-10-CM | POA: Diagnosis present

## 2015-08-20 DIAGNOSIS — F332 Major depressive disorder, recurrent severe without psychotic features: Secondary | ICD-10-CM | POA: Diagnosis present

## 2015-08-20 DIAGNOSIS — J9621 Acute and chronic respiratory failure with hypoxia: Secondary | ICD-10-CM | POA: Insufficient documentation

## 2015-08-20 DIAGNOSIS — L89159 Pressure ulcer of sacral region, unspecified stage: Secondary | ICD-10-CM | POA: Diagnosis present

## 2015-08-20 DIAGNOSIS — M109 Gout, unspecified: Secondary | ICD-10-CM | POA: Diagnosis present

## 2015-08-20 DIAGNOSIS — R1310 Dysphagia, unspecified: Secondary | ICD-10-CM | POA: Insufficient documentation

## 2015-08-20 DIAGNOSIS — R509 Fever, unspecified: Secondary | ICD-10-CM | POA: Diagnosis present

## 2015-08-20 DIAGNOSIS — D72829 Elevated white blood cell count, unspecified: Secondary | ICD-10-CM | POA: Diagnosis present

## 2015-08-20 DIAGNOSIS — J811 Chronic pulmonary edema: Secondary | ICD-10-CM | POA: Diagnosis present

## 2015-08-20 DIAGNOSIS — D709 Neutropenia, unspecified: Secondary | ICD-10-CM | POA: Diagnosis present

## 2015-08-20 DIAGNOSIS — G4700 Insomnia, unspecified: Secondary | ICD-10-CM | POA: Diagnosis present

## 2015-08-20 HISTORY — DX: Diffuse large B-cell lymphoma, unspecified site: C83.30

## 2015-08-20 LAB — COMPREHENSIVE METABOLIC PANEL
ALT: 33 U/L (ref 17–63)
ANION GAP: 5 (ref 5–15)
AST: 67 U/L — ABNORMAL HIGH (ref 15–41)
Albumin: 2.4 g/dL — ABNORMAL LOW (ref 3.5–5.0)
Alkaline Phosphatase: 135 U/L — ABNORMAL HIGH (ref 38–126)
BILIRUBIN TOTAL: 0.2 mg/dL — AB (ref 0.3–1.2)
BUN: 16 mg/dL (ref 6–20)
CALCIUM: 8.1 mg/dL — AB (ref 8.9–10.3)
CO2: 24 mmol/L (ref 22–32)
Chloride: 107 mmol/L (ref 101–111)
Creatinine, Ser: 0.89 mg/dL (ref 0.61–1.24)
GFR calc Af Amer: >60 mL/min (ref >60)
Glucose, Bld: 101 mg/dL — ABNORMAL HIGH (ref 65–99)
POTASSIUM: 3.8 mmol/L (ref 3.5–5.1)
Sodium: 136 mmol/L (ref 135–145)
TOTAL PROTEIN: 6 g/dL — AB (ref 6.5–8.1)

## 2015-08-20 LAB — CBC WITH DIFFERENTIAL/PLATELET
BASOS PCT: 0 % (ref 0–1)
Basophils Absolute: 0 10*3/uL (ref 0.0–0.1)
EOS PCT: 0 % (ref 0–5)
Eosinophils Absolute: 0 10*3/uL (ref 0.0–0.7)
HEMATOCRIT: 26.3 % — AB (ref 39.0–52.0)
Hemoglobin: 8.9 g/dL — ABNORMAL LOW (ref 13.0–17.0)
LYMPHS ABS: 0.7 10*3/uL (ref 0.7–4.0)
Lymphocytes Relative: 7 % — ABNORMAL LOW (ref 12–46)
MCH: 29 pg (ref 26.0–34.0)
MCHC: 33.8 g/dL (ref 30.0–36.0)
MCV: 85.7 fL (ref 78.0–100.0)
MONO ABS: 0.7 10*3/uL (ref 0.1–1.0)
Monocytes Relative: 7 % (ref 3–12)
NEUTROS ABS: 8.4 10*3/uL — AB (ref 1.7–7.7)
Neutrophils Relative %: 86 % — ABNORMAL HIGH (ref 43–77)
Platelets: 242 10*3/uL (ref 150–400)
RBC: 3.07 MIL/uL — ABNORMAL LOW (ref 4.22–5.81)
RDW: 17 % — AB (ref 11.5–15.5)
WBC: 9.8 10*3/uL (ref 4.0–10.5)

## 2015-08-20 LAB — I-STAT CG4 LACTIC ACID, ED: Lactic Acid, Venous: 2.1 mmol/L (ref 0.5–2.0)

## 2015-08-20 MED ORDER — PIPERACILLIN-TAZOBACTAM 3.375 G IVPB 30 MIN
3.3750 g | Freq: Once | INTRAVENOUS | Status: AC
Start: 2015-08-20 — End: 2015-08-21
  Administered 2015-08-20: 3.375 g via INTRAVENOUS
  Filled 2015-08-20: qty 50

## 2015-08-20 MED ORDER — VANCOMYCIN HCL IN DEXTROSE 1-5 GM/200ML-% IV SOLN
1000.0000 mg | Freq: Once | INTRAVENOUS | Status: AC
  Administered 2015-08-21: 1000 mg via INTRAVENOUS
  Filled 2015-08-20: qty 200

## 2015-08-20 MED ORDER — PIPERACILLIN-TAZOBACTAM 4.5 G IVPB: 4.5000 g | Freq: Once | INTRAVENOUS | Status: DC

## 2015-08-20 MED ORDER — SODIUM CHLORIDE 0.9 % IV BOLUS (SEPSIS): 1000.0000 mL | Freq: Once | INTRAVENOUS | Status: DC

## 2015-08-20 MED ORDER — SODIUM CHLORIDE 0.9 % IV BOLUS (SEPSIS)
1000.0000 mL | Freq: Once | INTRAVENOUS | Status: AC
  Administered 2015-08-20: 1000 mL via INTRAVENOUS

## 2015-08-20 NOTE — ED Notes (Signed)
Bed: WA02 Expected date:  Expected time:  Means of arrival:  Comments: 39M non hodgkins lymphoma, fever, 1 week out from first tx, leg swelling

## 2015-08-20 NOTE — ED Notes (Signed)
Pt is presented from home by EMS, report of ever 100.8, pt is an oncology pt, called the cancer center on call number who advised pt to come in for further evaluation due to the fever and shortness of breath.

## 2015-08-20 NOTE — ED Notes (Signed)
RN STATED THAT THEY WOULD GET THE BLOOD CULTURES FOR THIS PT.

## 2015-08-20 NOTE — ED Provider Notes (Addendum)
CSN: [id number]     Arrival date & time 08/20/15  2103 History   First MD Initiated Contact with Patient 08/20/15 2141     Chief Complaint  Patient presents with  . Fever  . Shortness of Breath     (Consider location/radiation/quality/duration/timing/severity/associated sxs/prior Treatment) HPI  69 year old male who presents with fever and SOB. History of diffuse large B-cell lymphoma s/p R-CHOP and intrathecal MTX on 8/16-8/17/16 with neutropenia. Developed fever yesterday of 100.5 Fahrenheit and was evaluated in the emergency department. No source of infection was noted, and patient fell while also was discharged home. He was given a course of Levaquin by his oncologist, which she has yet to start taking. However, he continues to develop fever with progressively worsening shortness of breath and cough today. He had a MAXIMUM TEMPERATURE of 100.8 Fahrenheit. He subsequently came to the emergency department for reevaluation. He denies any nausea, vomiting, chest pain, abdominal pain, diarrhea, dysuria or urinary frequency. He has noted symmetric lower extremity swelling, but denies any leg pain. Denies any rashes or wounds.    Past Medical History  Diagnosis Date  . History of bleeding ulcers 1990's  . 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 Anxiety   . GERD (gastroesophageal reflux disease)   . H. pylori infection   . History of blood transfusion   . Bone cancer      tumor on C5  . Arm numbness 08/07/15    Limited movement due to tumor on spine   Past Surgical History  Procedure Laterality Date  . Hernia repair Bilateral     with mesh  . Colonoscopy    . Eye surgery Right     catheter  . Anterior cervical corpectomy N/A 07/26/2015    Procedure: Cervical five Corpectomy/Cervical four-six Fusion/Plate ;  Surgeon: Eustace Moore, MD;  Location: [facility];  Service: Neurosurgery;  Laterality: N/A;  C5 Corpectomy/C4-6 Fusion/Plate C4-6   Family History  Problem Relation Age of Onset  .  Hypertension Mother    Social History  Substance Use Topics  . Smoking status: Former Smoker -- 1.00 packs/day for 10 years    Types: Cigarettes    Quit date: 12/23/1981  . Smokeless tobacco: Never Used  . Alcohol Use: 3.0 oz/week    2 Glasses of wine, 3 Cans of beer per week    Review of Systems 10/14 systems reviewed and are negative other than those stated in the HPI   Allergies  Review of patient's allergies indicates no known allergies.  Home Medications   Prior to Admission medications   Medication Sig Start Date End Date Taking? Authorizing Provider  acetaminophen (TYLENOL) 500 MG tablet Take 1,000 mg by mouth every 6 (six) hours as needed for mild pain, fever or headache.    Yes Historical Provider, MD  allopurinol (ZYLOPRIM) 300 MG tablet Take 1 tablet (300 mg total) by mouth daily. 08/08/15  Yes Brunetta Genera, MD  diazepam (VALIUM) 5 MG tablet Take 5 mg by mouth every 6 (six) hours as needed for anxiety.    Yes Historical Provider, MD  HYDROcodone-acetaminophen (NORCO/VICODIN) 5-325 MG per tablet Take 1 tablet by mouth every 4 (four) hours as needed (mild pain). 07/27/15  Yes Eustace Moore, MD  levofloxacin (LEVAQUIN) 750 MG tablet Take 1 tablet (750 mg total) by mouth daily. IF fever develops while neutropenic. Call doctor immediately. 08/15/15  Yes Brunetta Genera, MD  LORazepam (ATIVAN) 0.5 MG tablet Take 1 tablet (0.5 mg  total) by mouth every 6 (six) hours as needed (Nausea or vomiting). Patient taking differently: Take 0.5 mg by mouth at bedtime.  08/08/15  Yes Brunetta Genera, MD  Multiple Vitamin (MULTIVITAMIN WITH MINERALS) TABS tablet Take 1 tablet by mouth daily.   Yes Historical Provider, MD  neomycin-bacitracin-polymyxin (NEOSPORIN) ointment Apply 1 application topically as needed for wound care (left foot blister). apply to eye   Yes Historical Provider, MD  omeprazole (PRILOSEC) 40 MG capsule Take 1 capsule (40 mg total) by mouth daily. 07/24/15  Yes  Brunetta Genera, MD  senna-docusate (SENNA S) 8.6-50 MG per tablet Take 2 tablets by mouth at bedtime. 08/07/15  Yes Brunetta Genera, MD  simvastatin (ZOCOR) 20 MG tablet Take 20 mg by mouth daily.   Yes Historical Provider, MD  lidocaine-prilocaine (EMLA) cream Apply to affected area once 08/08/15   Brunetta Genera, MD  ondansetron (ZOFRAN) 8 MG tablet Take 1 tablet (8 mg total) by mouth 2 (two) times daily. Start the day after chemo for 3 days. Then as needed for nausea or vomiting. 08/08/15   Brunetta Genera, MD  predniSONE (DELTASONE) 20 MG tablet Take 4.5 tablets (90 mg total) by mouth daily. Take on days 1-5 of chemotherapy. Patient not taking: Reported on 08/20/2015 08/08/15   Brunetta Genera, MD  [place]    Historical Provider, MD  prochlorperazine (COMPAZINE) 10 MG tablet Take 1 tablet (10 mg total) by mouth every 6 (six) hours as needed (Nausea or vomiting). 08/08/15   Brunetta Genera, MD   BP 145/77 mmHg  Pulse 112  Temp(Src) 99.3 F (37.4 C) (Oral)  Resp 18  SpO2 90% Physical Exam Physical Exam  Nursing note and vitals reviewed. Constitutional: Chronically ill appearing, well nourished, non-toxic, and in no acute distress Head: Normocephalic and atraumatic.  Mouth/Throat: Oropharynx is clear. Dry mucous membranes.  Neck: Normal range of motion. Neck supple.  Cardiovascular: Tachycardic rate and regular rhythm.  Bilateral symmetrical +1 pitting edema.  Pulmonary/Chest: Effort normal and breath sounds coarse. No conversational dyspnea. Abdominal: Soft. There is no tenderness. There is no rebound and no guarding.  Musculoskeletal: Normal range of motion.  Neurological: Alert, no facial droop, fluent speech, moves all extremities symmetrically Skin: Skin is warm and dry.  Psychiatric: Cooperative  ED Course  Procedures (including critical care time) Labs Review Labs Reviewed  CBC WITH DIFFERENTIAL/PLATELET - Abnormal; Notable for  the following:    RBC 3.07 (*)    Hemoglobin 8.9 (*)    HCT 26.3 (*)    RDW 17.0 (*)    Neutrophils Relative % 86 (*)    Lymphocytes Relative 7 (*)    Neutro Abs 8.4 (*)    All other components within normal limits  COMPREHENSIVE METABOLIC PANEL - Abnormal; Notable for the following:    Glucose, Bld 101 (*)    Calcium 8.1 (*)    Total Protein 6.0 (*)    Albumin 2.4 (*)    AST 67 (*)    Alkaline Phosphatase 135 (*)    Total Bilirubin 0.2 (*)    All other components within normal limits  I-STAT CG4 LACTIC ACID, ED - Abnormal; Notable for the following:    Lactic Acid, Venous 2.10 (*)    All other components within normal limits  CULTURE, BLOOD (ROUTINE X 2)  CULTURE, BLOOD (ROUTINE X 2)  URINE CULTURE  URINALYSIS, ROUTINE W REFLEX MICROSCOPIC (NOT AT [facility])    Imaging Review Dg Chest  2 View  08/20/2015   CLINICAL DATA:  Fever, cough, dyspnea. One week out from first treatment for non-Hodgkin's lymphoma.  EXAM: CHEST  2 VIEW  COMPARISON:  08/19/2015  FINDINGS: There is developing alveolar opacity in the central lung bilaterally, a significant worsening from 08/19/2015. This may represent infectious infiltrate. Alveolar edema or hemorrhage is less likely. There are no pleural effusions. Heart size is unchanged.  IMPRESSION: Worsened central lung alveolar opacities bilaterally, suspicious for infectious infiltrates.   Electronically Signed   By: Andreas Newport M.D.   On: 08/20/2015 23:31   Dg Chest 2 View  08/19/2015   CLINICAL DATA:  Fever.  History of bone cancer.  EXAM: CHEST  2 VIEW  COMPARISON:  PET-CT - 07/10/2015; chest CT - 06/11/2015  FINDINGS: Grossly unchanged cardiac silhouette and mediastinal contours. There is mild diffuse slightly nodular thickening of the pulmonary arch ischium, most conspicuous within the peripheral aspects of the bilateral upper/ mid lungs. Minimal bibasilar heterogeneous opacities favored to represent atelectasis or scar. No discrete focal airspace  opacities. No pleural effusion or pneumothorax. No no acute osseous abnormalities. Post lower cervical ACDF, incompletely evaluated.  IMPRESSION: Findings suggestive of airways disease / bronchitis. No focal airspace opacities to suggest pneumonia.   Electronically Signed   By: Sandi Mariscal M.D.   On: 08/19/2015 09:24   I have personally reviewed and evaluated these images and lab results as part of my medical decision-making.   EKG Interpretation   Date/Time:  Sunday August 20 2015 21:34:32 EDT Ventricular Rate:  112 PR Interval:  178 QRS Duration: 84 QT Interval:  315 QTC Calculation: 430 R Axis:   7 Text Interpretation:  Sinus tachycardia No significant change since last  tracing Confirmed by Branna Cortina MD, Chayse Zatarain [phone]) on 08/20/2015 10:33:01 PM      CRITICAL CARE Performed by: Forde Dandy   Total critical care time: 30 minutes  Critical care time was exclusive of separately billable procedures and treating other patients.  Critical care was necessary to treat or prevent imminent or life-threatening deterioration.  Critical care was time spent personally by me on the following activities: management of sepsis, development of treatment plan with patient and/or surrogate as well as nursing, discussions with consultants, evaluation of patient's response to treatment, examination of patient, obtaining history from patient or surrogate, ordering and performing treatments and interventions, ordering and review of laboratory studies, ordering and review of radiographic studies, pulse oximetry and re-evaluation of patient's condition.  MDM   Final diagnoses:  Sepsis, due to unspecified organism  HCAP (healthcare-associated pneumonia)  Hypoxia    69 year old male with history of diffuse large B-cell lymphoma status post chemotherapy who presents with fever, shortness of breath, and cough. He has a 4 L oxygen requirement here in the emergency department, but normal work of breathing and  speaking full sentences. Initially tachycardic to 112, but normotensive. Afebrile on arrival, but had taken Tylenol prior to presenting to the ED. Coarse breath sounds are noted on exam. He has a benign abdomen. The remainder of his exam is otherwise unremarkable and nonfocal. Blood work does not show evidence of neutropenia. No evidence of leukocytosis but is immunosuppressed. Initial lactate of 2.1 and given 2L of IVF. Blood cultures and urine/urine cultures are obtained. A chest x-ray shows evidence of multiple and diffuse alveolar infiltrates that is suspicious for pneumonia. He was recently hospitalized within this month, and would qualify for hospital acquired pneumonia.  He is covered with vancomycin and Zosyn. He  will be admitted to [facility] for sepsis and HCAP.    Forde Dandy, MD 08/20/15 2346  Forde Dandy, MD 08/21/15 [phone]

## 2015-08-21 ENCOUNTER — Inpatient Hospital Stay: Admission: RE | Admit: 2015-08-21 | Payer: PPO | Source: Ambulatory Visit | Admitting: Radiation Oncology

## 2015-08-21 ENCOUNTER — Encounter (HOSPITAL_COMMUNITY): Payer: Self-pay | Admitting: Internal Medicine

## 2015-08-21 ENCOUNTER — Inpatient Hospital Stay: Admission: RE | Admit: 2015-08-21 | Payer: PPO | Source: Ambulatory Visit

## 2015-08-21 ENCOUNTER — Ambulatory Visit: Payer: PPO | Admitting: Radiation Oncology

## 2015-08-21 ENCOUNTER — Ambulatory Visit: Payer: PPO

## 2015-08-21 ENCOUNTER — Inpatient Hospital Stay (HOSPITAL_COMMUNITY): Payer: Non-veteran care

## 2015-08-21 DIAGNOSIS — R1310 Dysphagia, unspecified: Secondary | ICD-10-CM | POA: Diagnosis not present

## 2015-08-21 DIAGNOSIS — R7989 Other specified abnormal findings of blood chemistry: Secondary | ICD-10-CM | POA: Diagnosis present

## 2015-08-21 DIAGNOSIS — D709 Neutropenia, unspecified: Secondary | ICD-10-CM | POA: Diagnosis present

## 2015-08-21 DIAGNOSIS — E43 Unspecified severe protein-calorie malnutrition: Secondary | ICD-10-CM | POA: Diagnosis present

## 2015-08-21 DIAGNOSIS — M109 Gout, unspecified: Secondary | ICD-10-CM | POA: Diagnosis present

## 2015-08-21 DIAGNOSIS — I471 Supraventricular tachycardia: Secondary | ICD-10-CM

## 2015-08-21 DIAGNOSIS — E8809 Other disorders of plasma-protein metabolism, not elsewhere classified: Secondary | ICD-10-CM | POA: Diagnosis present

## 2015-08-21 DIAGNOSIS — Z6825 Body mass index (BMI) 25.0-25.9, adult: Secondary | ICD-10-CM | POA: Diagnosis not present

## 2015-08-21 DIAGNOSIS — J189 Pneumonia, unspecified organism: Secondary | ICD-10-CM | POA: Diagnosis present

## 2015-08-21 DIAGNOSIS — J9691 Respiratory failure, unspecified with hypoxia: Secondary | ICD-10-CM | POA: Diagnosis not present

## 2015-08-21 DIAGNOSIS — B379 Candidiasis, unspecified: Secondary | ICD-10-CM | POA: Diagnosis not present

## 2015-08-21 DIAGNOSIS — B259 Cytomegaloviral disease, unspecified: Secondary | ICD-10-CM | POA: Diagnosis not present

## 2015-08-21 DIAGNOSIS — R918 Other nonspecific abnormal finding of lung field: Secondary | ICD-10-CM | POA: Diagnosis not present

## 2015-08-21 DIAGNOSIS — M4800 Spinal stenosis, site unspecified: Secondary | ICD-10-CM | POA: Diagnosis present

## 2015-08-21 DIAGNOSIS — F332 Major depressive disorder, recurrent severe without psychotic features: Secondary | ICD-10-CM | POA: Diagnosis not present

## 2015-08-21 DIAGNOSIS — C8338 Diffuse large B-cell lymphoma, lymph nodes of multiple sites: Secondary | ICD-10-CM | POA: Diagnosis not present

## 2015-08-21 DIAGNOSIS — Z87891 Personal history of nicotine dependence: Secondary | ICD-10-CM | POA: Diagnosis not present

## 2015-08-21 DIAGNOSIS — Z8249 Family history of ischemic heart disease and other diseases of the circulatory system: Secondary | ICD-10-CM | POA: Diagnosis not present

## 2015-08-21 DIAGNOSIS — J80 Acute respiratory distress syndrome: Secondary | ICD-10-CM | POA: Diagnosis not present

## 2015-08-21 DIAGNOSIS — J9621 Acute and chronic respiratory failure with hypoxia: Secondary | ICD-10-CM | POA: Insufficient documentation

## 2015-08-21 DIAGNOSIS — Z9221 Personal history of antineoplastic chemotherapy: Secondary | ICD-10-CM | POA: Diagnosis not present

## 2015-08-21 DIAGNOSIS — E876 Hypokalemia: Secondary | ICD-10-CM | POA: Diagnosis not present

## 2015-08-21 DIAGNOSIS — R509 Fever, unspecified: Secondary | ICD-10-CM | POA: Diagnosis present

## 2015-08-21 DIAGNOSIS — T797XXA Traumatic subcutaneous emphysema, initial encounter: Secondary | ICD-10-CM | POA: Diagnosis not present

## 2015-08-21 DIAGNOSIS — I10 Essential (primary) hypertension: Secondary | ICD-10-CM | POA: Diagnosis present

## 2015-08-21 DIAGNOSIS — R740 Nonspecific elevation of levels of transaminase and lactic acid dehydrogenase [LDH]: Secondary | ICD-10-CM | POA: Diagnosis present

## 2015-08-21 DIAGNOSIS — E46 Unspecified protein-calorie malnutrition: Secondary | ICD-10-CM | POA: Diagnosis present

## 2015-08-21 DIAGNOSIS — R532 Functional quadriplegia: Secondary | ICD-10-CM | POA: Diagnosis present

## 2015-08-21 DIAGNOSIS — R1314 Dysphagia, pharyngoesophageal phase: Secondary | ICD-10-CM | POA: Diagnosis not present

## 2015-08-21 DIAGNOSIS — E860 Dehydration: Secondary | ICD-10-CM | POA: Diagnosis present

## 2015-08-21 DIAGNOSIS — K219 Gastro-esophageal reflux disease without esophagitis: Secondary | ICD-10-CM | POA: Diagnosis present

## 2015-08-21 DIAGNOSIS — I248 Other forms of acute ischemic heart disease: Secondary | ICD-10-CM | POA: Diagnosis present

## 2015-08-21 DIAGNOSIS — C8330 Diffuse large B-cell lymphoma, unspecified site: Secondary | ICD-10-CM | POA: Diagnosis not present

## 2015-08-21 DIAGNOSIS — T451X5A Adverse effect of antineoplastic and immunosuppressive drugs, initial encounter: Secondary | ICD-10-CM | POA: Diagnosis present

## 2015-08-21 DIAGNOSIS — Y95 Nosocomial condition: Secondary | ICD-10-CM | POA: Diagnosis not present

## 2015-08-21 DIAGNOSIS — D899 Disorder involving the immune mechanism, unspecified: Secondary | ICD-10-CM | POA: Diagnosis present

## 2015-08-21 DIAGNOSIS — A419 Sepsis, unspecified organism: Secondary | ICD-10-CM | POA: Diagnosis present

## 2015-08-21 DIAGNOSIS — T458X5A Adverse effect of other primarily systemic and hematological agents, initial encounter: Secondary | ICD-10-CM | POA: Diagnosis not present

## 2015-08-21 DIAGNOSIS — Z9911 Dependence on respirator [ventilator] status: Secondary | ICD-10-CM | POA: Diagnosis not present

## 2015-08-21 DIAGNOSIS — R700 Elevated erythrocyte sedimentation rate: Secondary | ICD-10-CM | POA: Diagnosis not present

## 2015-08-21 DIAGNOSIS — J9601 Acute respiratory failure with hypoxia: Secondary | ICD-10-CM | POA: Diagnosis not present

## 2015-08-21 DIAGNOSIS — R05 Cough: Secondary | ICD-10-CM | POA: Diagnosis present

## 2015-08-21 DIAGNOSIS — Z79891 Long term (current) use of opiate analgesic: Secondary | ICD-10-CM | POA: Diagnosis not present

## 2015-08-21 DIAGNOSIS — R29898 Other symptoms and signs involving the musculoskeletal system: Secondary | ICD-10-CM | POA: Diagnosis not present

## 2015-08-21 DIAGNOSIS — J709 Respiratory conditions due to unspecified external agent: Secondary | ICD-10-CM | POA: Diagnosis not present

## 2015-08-21 DIAGNOSIS — F419 Anxiety disorder, unspecified: Secondary | ICD-10-CM | POA: Diagnosis not present

## 2015-08-21 DIAGNOSIS — T17990A Other foreign object in respiratory tract, part unspecified in causing asphyxiation, initial encounter: Secondary | ICD-10-CM | POA: Diagnosis not present

## 2015-08-21 DIAGNOSIS — R0902 Hypoxemia: Secondary | ICD-10-CM

## 2015-08-21 DIAGNOSIS — R410 Disorientation, unspecified: Secondary | ICD-10-CM | POA: Diagnosis not present

## 2015-08-21 DIAGNOSIS — D6481 Anemia due to antineoplastic chemotherapy: Secondary | ICD-10-CM | POA: Diagnosis present

## 2015-08-21 DIAGNOSIS — R000 Tachycardia, unspecified: Secondary | ICD-10-CM | POA: Diagnosis present

## 2015-08-21 DIAGNOSIS — IMO0001 Reserved for inherently not codable concepts without codable children: Secondary | ICD-10-CM | POA: Insufficient documentation

## 2015-08-21 DIAGNOSIS — R739 Hyperglycemia, unspecified: Secondary | ICD-10-CM | POA: Diagnosis present

## 2015-08-21 DIAGNOSIS — L89159 Pressure ulcer of sacral region, unspecified stage: Secondary | ICD-10-CM | POA: Diagnosis present

## 2015-08-21 DIAGNOSIS — E870 Hyperosmolality and hypernatremia: Secondary | ICD-10-CM | POA: Diagnosis present

## 2015-08-21 DIAGNOSIS — J811 Chronic pulmonary edema: Secondary | ICD-10-CM | POA: Diagnosis present

## 2015-08-21 DIAGNOSIS — D72829 Elevated white blood cell count, unspecified: Secondary | ICD-10-CM | POA: Diagnosis present

## 2015-08-21 DIAGNOSIS — X58XXXA Exposure to other specified factors, initial encounter: Secondary | ICD-10-CM | POA: Diagnosis not present

## 2015-08-21 DIAGNOSIS — Z79899 Other long term (current) drug therapy: Secondary | ICD-10-CM | POA: Diagnosis not present

## 2015-08-21 DIAGNOSIS — J9600 Acute respiratory failure, unspecified whether with hypoxia or hypercapnia: Secondary | ICD-10-CM | POA: Diagnosis not present

## 2015-08-21 DIAGNOSIS — D492 Neoplasm of unspecified behavior of bone, soft tissue, and skin: Secondary | ICD-10-CM | POA: Diagnosis present

## 2015-08-21 DIAGNOSIS — G4700 Insomnia, unspecified: Secondary | ICD-10-CM | POA: Diagnosis present

## 2015-08-21 LAB — CBC
HCT: 25.5 % — ABNORMAL LOW (ref 39.0–52.0)
Hemoglobin: 8.7 g/dL — ABNORMAL LOW (ref 13.0–17.0)
MCH: 29.2 pg (ref 26.0–34.0)
MCHC: 34.1 g/dL (ref 30.0–36.0)
MCV: 85.6 fL (ref 78.0–100.0)
PLATELETS: 231 10*3/uL (ref 150–400)
RBC: 2.98 MIL/uL — AB (ref 4.22–5.81)
RDW: 17.1 % — ABNORMAL HIGH (ref 11.5–15.5)
WBC: 9.6 10*3/uL (ref 4.0–10.5)

## 2015-08-21 LAB — URINALYSIS, ROUTINE W REFLEX MICROSCOPIC
BILIRUBIN URINE: NEGATIVE
Glucose, UA: NEGATIVE mg/dL
Hgb urine dipstick: NEGATIVE
KETONES UR: NEGATIVE mg/dL
LEUKOCYTES UA: NEGATIVE
NITRITE: NEGATIVE
PROTEIN: 30 mg/dL — AB
Specific Gravity, Urine: 1.03 (ref 1.005–1.030)
Urobilinogen, UA: 1 mg/dL (ref 0.0–1.0)
pH: 5.5 (ref 5.0–8.0)

## 2015-08-21 LAB — BASIC METABOLIC PANEL
Anion gap: 6 (ref 5–15)
BUN: 14 mg/dL (ref 6–20)
CHLORIDE: 107 mmol/L (ref 101–111)
CO2: 24 mmol/L (ref 22–32)
CREATININE: 1 mg/dL (ref 0.61–1.24)
Calcium: 7.9 mg/dL — ABNORMAL LOW (ref 8.9–10.3)
GFR calc non Af Amer: >60 mL/min (ref >60)
Glucose, Bld: 102 mg/dL — ABNORMAL HIGH (ref 65–99)
Potassium: 3.6 mmol/L (ref 3.5–5.1)
Sodium: 137 mmol/L (ref 135–145)

## 2015-08-21 LAB — TROPONIN I
TROPONIN I: 0.03 ng/mL (ref <0.031)
Troponin I: 0.05 ng/mL — ABNORMAL HIGH (ref <0.031)

## 2015-08-21 LAB — BLOOD GAS, ARTERIAL
Acid-Base Excess: 0.1 mmol/L (ref 0.0–2.0)
BICARBONATE: 23 meq/L (ref 20.0–24.0)
DRAWN BY: 295031
FIO2: 1
O2 Saturation: 92.8 %
PCO2 ART: 31.9 mmHg — AB (ref 35.0–45.0)
PH ART: 7.472 — AB (ref 7.350–7.450)
Patient temperature: 98.6
TCO2: 21.4 mmol/L (ref 0–100)
pO2, Arterial: 66.6 mmHg — ABNORMAL LOW (ref 80.0–100.0)

## 2015-08-21 LAB — C-REACTIVE PROTEIN: CRP: 45.5 mg/dL — ABNORMAL HIGH (ref <1.0)

## 2015-08-21 LAB — PROTIME-INR
INR: 1.36 (ref 0.00–1.49)
PROTHROMBIN TIME: 16.9 s — AB (ref 11.6–15.2)

## 2015-08-21 LAB — URIC ACID: URIC ACID, SERUM: 2.2 mg/dL — AB (ref 4.4–7.6)

## 2015-08-21 LAB — URINE MICROSCOPIC-ADD ON

## 2015-08-21 LAB — LACTIC ACID, PLASMA
LACTIC ACID, VENOUS: 1.6 mmol/L (ref 0.5–2.0)
LACTIC ACID, VENOUS: 1.2 mmol/L (ref 0.5–2.0)
LACTIC ACID, VENOUS: 1.6 mmol/L (ref 0.5–2.0)

## 2015-08-21 LAB — APTT: aPTT: 54 seconds — ABNORMAL HIGH (ref 24–37)

## 2015-08-21 LAB — PROCALCITONIN: PROCALCITONIN: 1.98 ng/mL

## 2015-08-21 LAB — STREP PNEUMONIAE URINARY ANTIGEN: Strep Pneumo Urinary Antigen: NEGATIVE

## 2015-08-21 LAB — BRAIN NATRIURETIC PEPTIDE: B Natriuretic Peptide: 86.7 pg/mL (ref 0.0–100.0)

## 2015-08-21 LAB — LACTATE DEHYDROGENASE: LDH: 535 U/L — AB (ref 98–192)

## 2015-08-21 LAB — SEDIMENTATION RATE: Sed Rate: 125 mm/hr — ABNORMAL HIGH (ref 0–16)

## 2015-08-21 MED ORDER — PIPERACILLIN-TAZOBACTAM 3.375 G IVPB 30 MIN: 3.3750 g | Freq: Three times a day (TID) | INTRAVENOUS | Status: DC

## 2015-08-21 MED ORDER — ACETAMINOPHEN 650 MG RE SUPP
650.0000 mg | Freq: Four times a day (QID) | RECTAL | Status: DC | PRN
  Administered 2015-08-21: 650 mg via RECTAL
  Filled 2015-08-21: qty 1

## 2015-08-21 MED ORDER — HYDROCODONE-ACETAMINOPHEN 5-325 MG PO TABS: 1.0000 | ORAL_TABLET | ORAL | Status: DC | PRN

## 2015-08-21 MED ORDER — PIPERACILLIN-TAZOBACTAM 3.375 G IVPB
3.3750 g | Freq: Three times a day (TID) | INTRAVENOUS | Status: DC
  Administered 2015-08-21 – 2015-08-28 (×22): 3.375 g via INTRAVENOUS
  Filled 2015-08-21 (×21): qty 50

## 2015-08-21 MED ORDER — ADULT MULTIVITAMIN W/MINERALS CH
1.0000 | ORAL_TABLET | Freq: Every day | ORAL | Status: DC
  Administered 2015-08-21: 1 via ORAL
  Filled 2015-08-21: qty 1

## 2015-08-21 MED ORDER — SIMVASTATIN 10 MG PO TABS
20.0000 mg | ORAL_TABLET | Freq: Every day | ORAL | Status: DC
  Administered 2015-08-21: 20 mg via ORAL
  Filled 2015-08-21: qty 1

## 2015-08-21 MED ORDER — SODIUM CHLORIDE 0.9 % IV SOLN
INTRAVENOUS | Status: DC
  Administered 2015-08-21 – 2015-08-22 (×5): via INTRAVENOUS

## 2015-08-21 MED ORDER — ALLOPURINOL 100 MG PO TABS
300.0000 mg | ORAL_TABLET | Freq: Every day | ORAL | Status: DC
  Administered 2015-08-21: 300 mg via ORAL
  Filled 2015-08-21: qty 1

## 2015-08-21 MED ORDER — DIAZEPAM 5 MG PO TABS
5.0000 mg | ORAL_TABLET | Freq: Four times a day (QID) | ORAL | Status: DC | PRN
  Filled 2015-08-21: qty 1

## 2015-08-21 MED ORDER — SENNOSIDES-DOCUSATE SODIUM 8.6-50 MG PO TABS
2.0000 | ORAL_TABLET | Freq: Every day | ORAL | Status: DC
  Administered 2015-08-21: 2 via ORAL
  Filled 2015-08-21: qty 2

## 2015-08-21 MED ORDER — BACITRACIN-NEOMYCIN-POLYMYXIN 400-5-5000 EX OINT: 1.0000 "application " | TOPICAL_OINTMENT | CUTANEOUS | Status: DC | PRN

## 2015-08-21 MED ORDER — OXYCODONE HCL 5 MG PO TABS
5.0000 mg | ORAL_TABLET | ORAL | Status: DC | PRN
  Administered 2015-08-21: 5 mg via ORAL
  Filled 2015-08-21: qty 1

## 2015-08-21 MED ORDER — DEXAMETHASONE SODIUM PHOSPHATE 4 MG/ML IJ SOLN
4.0000 mg | Freq: Two times a day (BID) | INTRAMUSCULAR | Status: DC
  Administered 2015-08-21: 4 mg via INTRAVENOUS
  Filled 2015-08-21: qty 1

## 2015-08-21 MED ORDER — SULFAMETHOXAZOLE-TRIMETHOPRIM 400-80 MG/5ML IV SOLN
500.0000 mg | INTRAVENOUS | Status: AC
  Administered 2015-08-21: 500 mg via INTRAVENOUS
  Filled 2015-08-21: qty 31.3

## 2015-08-21 MED ORDER — ONDANSETRON HCL 4 MG/2ML IJ SOLN: 4.0000 mg | Freq: Four times a day (QID) | INTRAMUSCULAR | Status: DC | PRN

## 2015-08-21 MED ORDER — ONDANSETRON HCL 4 MG PO TABS: 4.0000 mg | ORAL_TABLET | Freq: Four times a day (QID) | ORAL | Status: DC | PRN

## 2015-08-21 MED ORDER — SULFAMETHOXAZOLE-TRIMETHOPRIM 400-80 MG/5ML IV SOLN
500.0000 mg | Freq: Three times a day (TID) | INTRAVENOUS | Status: DC
  Administered 2015-08-22 – 2015-08-23 (×5): 500 mg via INTRAVENOUS
  Filled 2015-08-21 (×3): qty 31.3
  Filled 2015-08-21: qty 31.25
  Filled 2015-08-21 (×2): qty 31.3

## 2015-08-21 MED ORDER — OXYCODONE HCL 5 MG PO TABS: 5.0000 mg | ORAL_TABLET | ORAL | Status: DC | PRN

## 2015-08-21 MED ORDER — LORAZEPAM 2 MG/ML IJ SOLN
1.0000 mg | INTRAMUSCULAR | Status: DC
  Filled 2015-08-21: qty 1

## 2015-08-21 MED ORDER — ENOXAPARIN SODIUM 40 MG/0.4ML ~~LOC~~ SOLN
40.0000 mg | SUBCUTANEOUS | Status: DC
  Administered 2015-08-21 – 2015-09-05 (×16): 40 mg via SUBCUTANEOUS
  Filled 2015-08-21 (×16): qty 0.4

## 2015-08-21 MED ORDER — PANTOPRAZOLE SODIUM 40 MG PO TBEC
40.0000 mg | DELAYED_RELEASE_TABLET | Freq: Every day | ORAL | Status: DC
  Administered 2015-08-21: 40 mg via ORAL
  Filled 2015-08-21: qty 1

## 2015-08-21 MED ORDER — ALUM & MAG HYDROXIDE-SIMETH 200-200-20 MG/5ML PO SUSP: 30.0000 mL | Freq: Four times a day (QID) | ORAL | Status: DC | PRN

## 2015-08-21 MED ORDER — LORAZEPAM 2 MG/ML IJ SOLN
0.5000 mg | INTRAMUSCULAR | Status: DC
  Administered 2015-08-21 – 2015-08-22 (×2): 0.5 mg via INTRAVENOUS
  Filled 2015-08-21 (×3): qty 1

## 2015-08-21 MED ORDER — ACETAMINOPHEN 325 MG PO TABS
650.0000 mg | ORAL_TABLET | Freq: Four times a day (QID) | ORAL | Status: DC | PRN
Start: 2015-08-21 — End: 2015-09-05
  Administered 2015-08-28 – 2015-08-30 (×4): 650 mg via ORAL
  Filled 2015-08-21 (×4): qty 2

## 2015-08-21 MED ORDER — ALBUTEROL SULFATE (2.5 MG/3ML) 0.083% IN NEBU: 2.5000 mg | INHALATION_SOLUTION | RESPIRATORY_TRACT | Status: DC | PRN

## 2015-08-21 MED ORDER — VANCOMYCIN HCL 10 G IV SOLR
1250.0000 mg | Freq: Two times a day (BID) | INTRAVENOUS | Status: DC
  Administered 2015-08-21: 1250 mg via INTRAVENOUS
  Filled 2015-08-21 (×3): qty 1250

## 2015-08-21 MED ORDER — AZITHROMYCIN 250 MG PO TABS
500.0000 mg | ORAL_TABLET | Freq: Every day | ORAL | Status: DC
  Administered 2015-08-21: 500 mg via ORAL
  Filled 2015-08-21: qty 2

## 2015-08-21 MED ORDER — VANCOMYCIN HCL IN DEXTROSE 1-5 GM/200ML-% IV SOLN: 1000.0000 mg | Freq: Once | INTRAVENOUS | Status: DC

## 2015-08-21 MED ORDER — HYDROMORPHONE HCL 1 MG/ML IJ SOLN: 0.5000 mg | INTRAMUSCULAR | Status: DC | PRN

## 2015-08-21 MED ORDER — IOHEXOL 350 MG/ML SOLN
100.0000 mL | Freq: Once | INTRAVENOUS | Status: AC | PRN
  Administered 2015-08-21: 100 mL via INTRAVENOUS

## 2015-08-21 MED ORDER — DEXAMETHASONE SODIUM PHOSPHATE 4 MG/ML IJ SOLN
4.0000 mg | Freq: Once | INTRAMUSCULAR | Status: AC
  Administered 2015-08-21: 4 mg via INTRAVENOUS
  Filled 2015-08-21: qty 1

## 2015-08-21 NOTE — Progress Notes (Addendum)
[facility] Progress Note   Ethan Stewart  WCB:[id number]  DOB: March 31, 1946  DOA: 08/20/2015 PCP:  Melinda Crutch, MD  Brief narrative: Ethan Stewart is a 69 y.o. male with diffuse B-cell lymphoma diagnosed on 07/2015. He has a met at C5-C6 causing nerve root compression status post corpectomy for decompression on 8/3 by Dr. Ronnald Ramp. Asked to wear a neck brace for 8 weeks afterwards. He presents with acute onset severe fatigue, shortness of breath and a mild cough. He was started on Levaquin the day before admission by his oncologist but had not yet filled that. He was Found to have a low-grade fever in the ER and was quite hypoxic. Chest x-ray was suggestive of bilateral hilar infiltrates. He was started on treatment for HCAP.  Subjective: Cough is mild. Fatigue has improved significantly. No current shortness of breath. No nausea vomiting or diarrhea.  Assessment/Plan: Principal Problem:   HCAP (healthcare-associated pneumonia) acute respiratory failure with hypoxia/sepsis- tachycardia, tachypnea and low-grade fever -Is now requiring a 55% Ventimask to keep saturation at 90-92% therefore, I have transferred him to the step down unit -CT scan of the chest reveals bilateral airspace disease -Continue vancomycin and Zosyn-add Zithromax -Check ABG-start BiPAP when necessary -If no improvement noted, may need steroid-induced for a viral pneumonitis  Active Problems:   DLBCL (diffuse large B cell lymphoma) -First round of chemotherapy on 8/16  Cervical metastasis -Status post corpectomy-he states that about 10% of the tumor that was close to the spinal cord remained after the surgery -Received first dose of intrathecal methotrexate-continue c-collar -Continue Vicodin    Anemia associated with chemotherapy ---Continue to follow    GERD (gastroesophageal reflux disease) -Continue Protonix  Anxiety -Continue Ativan  Gout  -continue allopurinol  Code Status:     Code Status  Orders        Start     Ordered   08/21/15 0154  Full code   Continuous     08/21/15 0153    Advance Directive Documentation        Most Recent Value   Type of Advance Directive  Living will   Pre-existing out of facility DNR order (yellow form or pink MOST form)     "MOST" Form in Place?       Family Communication: Wife at bedside Disposition Plan: Follow in step down unit DVT prophylaxis: Lovenox Consultants: Procedures:  Antibiotics: Anti-infectives    Start     Dose/Rate Route Frequency Ordered Stop   08/21/15 2300  vancomycin (VANCOCIN) IVPB 1000 mg/200 mL premix  Status:  Discontinued     1,000 mg 200 mL/hr over 60 Minutes Intravenous  Once 08/21/15 0153 08/21/15 0217   08/21/15 1200  vancomycin (VANCOCIN) 1,250 mg in sodium chloride 0.9 % 250 mL IVPB     1,250 mg 166.7 mL/hr over 90 Minutes Intravenous Every 12 hours 08/21/15 0222     08/21/15 1100  azithromycin (ZITHROMAX) tablet 500 mg     500 mg Oral Daily 08/21/15 1052     08/21/15 0800  piperacillin-tazobactam (ZOSYN) IVPB 3.375 g  Status:  Discontinued     3.375 g 100 mL/hr over 30 Minutes Intravenous 3 times per day 08/21/15 0153 08/21/15 0218   08/21/15 0800  piperacillin-tazobactam (ZOSYN) IVPB 3.375 g     3.375 g 12.5 mL/hr over 240 Minutes Intravenous Every 8 hours 08/21/15 0218     08/20/15 2345  vancomycin (VANCOCIN) IVPB 1000 mg/200 mL premix     1,000 mg 200 mL/hr  over 60 Minutes Intravenous  Once 08/20/15 2338 08/21/15 0227   08/20/15 2345  piperacillin-tazobactam (ZOSYN) IVPB 4.5 g  Status:  Discontinued     4.5 g 200 mL/hr over 30 Minutes Intravenous  Once 08/20/15 2338 08/20/15 2341   08/20/15 2345  piperacillin-tazobactam (ZOSYN) IVPB 3.375 g     3.375 g 100 mL/hr over 30 Minutes Intravenous  Once 08/20/15 2341 08/21/15 0127      Objective: Filed Weights   08/21/15 0203 08/21/15 1200  Weight: 95.5 kg (210 lb 8.6 oz) 97.3 kg (214 lb 8.1 oz)    Intake/Output Summary (Last 24 hours) at  08/21/15 1217 Last data filed at 08/21/15 1054  Gross per 24 hour  Intake    445 ml  Output    400 ml  Net     45 ml     Vitals Filed Vitals:   08/21/15 0852 08/21/15 1000 08/21/15 1031 08/21/15 1200  BP: 142/69  139/74 141/79  Pulse: 114  104 104  Temp: 100.3 F (37.9 C)  99 F (37.2 C) 98.1 F (36.7 C)  TempSrc: Oral  Oral Oral  Resp: 48  46 43  Height:    '6\' 1"'  (1.854 m)  Weight:    97.3 kg (214 lb 8.1 oz)  SpO2: 91% 91% 87% 94%    Exam:  General:  Pt is alert, not in acute distress  HEENT: No icterus, No thrush, oral mucosa moist  Cardiovascular: regular rate and rhythm, S1/S2 No murmur  Respiratory: clear to auscultation, when I evaluated him this morning, pulse ox was 90% on 5 L   Abdomen: Soft, +Bowel sounds, non tender, non distended, no guarding  MSK: No LE edema, cyanosis or clubbing  Data Reviewed: Basic Metabolic Panel:  Recent Labs Lab 08/15/15 0809 08/19/15 1015 08/20/15 2241 08/21/15 0316  NA 138 137 136 137  K 4.2 3.7 3.8 3.6  CL  --  106 107 107  CO2 '24 24 24 24  ' GLUCOSE 86 103* 101* 102*  BUN 12.'4 15 16 14  ' CREATININE 0.8 0.87 0.89 1.00  CALCIUM 9.0 8.2* 8.1* 7.9*  MG 2.0  --   --   --   PHOS 2.4  --   --   --    Liver Function Tests:  Recent Labs Lab 08/15/15 0809 08/19/15 1015 08/20/15 2241  AST 15 48* 67*  ALT 35 35 33  ALKPHOS 77 118 135*  BILITOT 0.67 0.6 0.2*  PROT 5.8* 5.8* 6.0*  ALBUMIN 2.8* 2.5* 2.4*   No results for input(s): LIPASE, AMYLASE in the last 168 hours. No results for input(s): AMMONIA in the last 168 hours. CBC:  Recent Labs Lab 08/15/15 0809 08/19/15 1015 08/20/15 2241 08/21/15 0316  WBC 0.5* 10.2 9.8 9.6  NEUTROABS 0.1* 9.3* 8.4*  --   HGB 10.5* 9.2* 8.9* 8.7*  HCT 30.5* 27.5* 26.3* 25.5*  MCV 86.7 85.9 85.7 85.6  PLT 154 173 242 231   Cardiac Enzymes: No results for input(s): CKTOTAL, CKMB, CKMBINDEX, TROPONINI in the last 168 hours. BNP (last 3 results) No results for input(s): BNP  in the last 8760 hours.  ProBNP (last 3 results) No results for input(s): PROBNP in the last 8760 hours.  CBG: No results for input(s): GLUCAP in the last 168 hours.  No results found for this or any previous visit (from the past 240 hour(s)).   Studies: Dg Chest 2 View  08/20/2015   CLINICAL DATA:  Fever, cough, dyspnea. One week out  from first treatment for non-Hodgkin's lymphoma.  EXAM: CHEST  2 VIEW  COMPARISON:  08/19/2015  FINDINGS: There is developing alveolar opacity in the central lung bilaterally, a significant worsening from 08/19/2015. This may represent infectious infiltrate. Alveolar edema or hemorrhage is less likely. There are no pleural effusions. Heart size is unchanged.  IMPRESSION: Worsened central lung alveolar opacities bilaterally, suspicious for infectious infiltrates.   Electronically Signed   By: Andreas Newport M.D.   On: 08/20/2015 23:31   Ct Angio Chest Pe W/cm &/or Wo Cm  08/21/2015   CLINICAL DATA:  Hypoxia, cough, and shortness of breath for 2 days. Pneumonia. Large B-cell lymphoma with ongoing chemotherapy. Productive cough with fever and chills.  EXAM: CT ANGIOGRAPHY CHEST WITH CONTRAST  TECHNIQUE: Multidetector CT imaging of the chest was performed using the standard protocol during bolus administration of intravenous contrast. Multiplanar CT image reconstructions and MIPs were obtained to evaluate the vascular anatomy.  CONTRAST:  12m OMNIPAQUE IOHEXOL 350 MG/ML SOLN  COMPARISON:  06/21/2015  FINDINGS: THORACIC INLET/BODY WALL:  No acute abnormality.  MEDIASTINUM:  Normal heart size. No pericardial effusion. Suboptimal pulmonary artery opacification due to bolus dispersion and intermittent motion, but diagnostic and negative for pulmonary embolism. Four vessel aortic arch without aortic dissection or aneurysm.  LUNG WINDOWS:  There is ground-glass patchy lung opacity with apical predominance. The opacities are fairly symmetric. No interlobular septal thickening  at the bases or in non opacified portions of the lungs. A subpleural nodule in the right lower lobe on lung series image 54 is stable, appearance favoring incidental lymph node. Mild dependent atelectasis and trace pleural effusions.  No intrathoracic adenopathy.  UPPER ABDOMEN:  Pneumobilia which is chronic.  No indication of abdominal pain.  OSSEOUS:  No acute fracture.  No suspicious lytic or blastic lesions.  Review of the MIP images confirms the above findings.  IMPRESSION: 1. Bilateral airspace disease consistent with atypical pneumonia or noncardiogenic edema. 2. No evidence of pulmonary embolism. 3. Chronic pneumobilia.   Electronically Signed   By: JMonte FantasiaM.D.   On: 08/21/2015 10:35    Scheduled Meds:  Scheduled Meds: . allopurinol  300 mg Oral Daily  . azithromycin  500 mg Oral Daily  . enoxaparin (LOVENOX) injection  40 mg Subcutaneous Q24H  . multivitamin with minerals  1 tablet Oral Daily  . pantoprazole  40 mg Oral Daily  . piperacillin-tazobactam (ZOSYN)  IV  3.375 g Intravenous Q8H  . senna-docusate  2 tablet Oral QHS  . simvastatin  20 mg Oral Daily  . sodium chloride  1,000 mL Intravenous Once  . vancomycin  1,250 mg Intravenous Q12H   Continuous Infusions: . sodium chloride 75 mL/hr at 08/21/15 0219    Time spent on care of this patient: 40 min   RLadson MD 08/21/2015, 12:17 PM  LOS: 0 days   [facility] Office  [phone] - Text Page per [email or web address] If 7PM-7AM, please contact night-coverage [email or web address]

## 2015-08-21 NOTE — Progress Notes (Signed)
[name] Kitchen   HEMATOLOGY/ONCOLOGY INPATIENT PROGRESS NOTE  Date of Service: 08/21/2015  Inpatient Attending: .Debbe Odea, MD   SUBJECTIVE  [name] is well-known to me from clinic. He was recently diagnosed with diffuse large B-cell lymphoma involving his C5 vertebral body as well as with mesenteric and inguinal lymph nodes. He had his C5 corpectomy by neurosurgery on 08/22/2015 prior to which he was on high-dose dexamethasone as per neurosurgery for nearly 3 weeks pending surgery.  Subsequently he received his first cycle of R CHOP with Neulasta support on 08/08/2015 and intrathecal methotrexate for CNS prophylaxis on 08/09/2015. He was seen in follow-up in clinic on 08/15/2015 and was feeling great with no acute focal symptoms. He was noted to be neutropenic with ANC of 0.5. Was given extensive counseling on neutropenic precautions and given a prescription for levofloxacin for backup in case he developed neutropenic fever so he could have minimum delay prior to taking the first antibiotic dose and seeking immediate help in the emergency room.  Patient presented to the ED on 08/19/2015 with fevers of 100.5 and some shortness of breath. A showed a bronchitic-looking picture with no pneumonia. He was discharged home. He'll return the following day on 08/21/2015 with persistent fevers and increasing shortness of breath and was subsequently admitted. He has been having fevers up to 103F and cultures have been sent out. Patient has been started on broad-spectrum antibiotic coverage with Zosyn plus vancomycin because a azithromycin and ID has been consulted. IV Bactrim has been started for empiric PCP coverage. He has also been started on IV steroids for concern of possible ARDS from his G-CSF use in the setting of pulmonary infection. No hypotension at this time. He is needing noninvasive ventilatory support at this time on the stepdown unit. Notes no pain. Notes some leg swelling bilaterally. Feels that his  breathing is a little better today than yesterday.  He reports having a runny nose prior to these symptoms .  OBJECTIVE:   PHYSICAL EXAMINATION: . Filed Vitals:   08/21/15 1200 08/21/15 1300 08/21/15 1600 08/21/15 1608  BP: 141/79 162/72 166/71   Pulse: 104 102 110 111  Temp: 98.1 F (36.7 C)   103.3 F (39.6 C)  TempSrc: Oral   Rectal  Resp: 43 41 22 31  Height: '6\' 1"'  (1.854 m)     Weight: 214 lb 8.1 oz (97.3 kg)     SpO2: 94% 96% 100% 100%   Filed Weights   08/21/15 0203 08/21/15 1200  Weight: 210 lb 8.6 oz (95.5 kg) 214 lb 8.1 oz (97.3 kg)   .Body mass index is 28.31 kg/(m^2).  GENERAL:alert, on BiPAP machine , somewhat tachypneic and mild respiratory distress . SKIN: skin color, texture, turgor are normal, no rashes or significant lesions EYES: normal, conjunctiva are pink and non-injected, sclera clear OROPHARYNX:no exudate, no erythema and lips, buccal mucosa, and tongue normal  NECK: supple, no JVD, thyroid normal size, non-tender, without nodularity LYMPH:  no palpable lymphadenopathy in the cervical, axillary or inguinal LUNGS: clear to auscultation with normal respiratory effort HEART: regular rate & rhythm,  no murmurs and bilateral lower extremity 2+ edema. ABDOMEN: abdomen soft, non-tender, normoactive bowel sounds  Musculoskeletal: no cyanosis of digits and no clubbing  PSYCH: alert & oriented x 3 with fluent speech NEURO: no focal motor/sensory deficits  MEDICAL HISTORY:  Past Medical History  Diagnosis Date  . History of bleeding ulcers 1990's  . 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 Anxiety   . GERD (  gastroesophageal reflux disease)   . H. pylori infection   . History of blood transfusion   . Bone cancer      tumor on C5  . Arm numbness 08/07/15    Limited movement due to tumor on spine  . Diffuse large B cell lymphoma     biposy 07/25/2105   SURGICAL HISTORY: Past Surgical History  Procedure Laterality Date  . Hernia repair Bilateral     with  mesh  . Colonoscopy    . Eye surgery Right     catheter  . Anterior cervical corpectomy N/A 07/26/2015    Procedure: Cervical five Corpectomy/Cervical four-six Fusion/Plate ;  Surgeon: Eustace Moore, MD;  Location: [facility];  Service: Neurosurgery;  Laterality: N/A;  C5 Corpectomy/C4-6 Fusion/Plate C4-6    SOCIAL HISTORY: Social History   Social History  . Marital Status: Married    Spouse Name: N/A  . Number of Children: N/A  . Years of Education: N/A   Occupational History  . Not on file.   Social History Main Topics  . Smoking status: Former Smoker -- 1.00 packs/day for 10 years    Types: Cigarettes    Quit date: 12/23/1981  . Smokeless tobacco: Never Used  . Alcohol Use: 3.0 oz/week    2 Glasses of wine, 3 Cans of beer per week  . Drug Use: No  . Sexual Activity: Yes   Other Topics Concern  . Not on file   Social History Narrative    FAMILY HISTORY: Family History  Problem Relation Age of Onset  . Hypertension Mother     ALLERGIES:  has No Known Allergies.  MEDICATIONS:  Scheduled Meds: . azithromycin  500 mg Oral Daily  . dexamethasone  4 mg Intravenous Q12H  . enoxaparin (LOVENOX) injection  40 mg Subcutaneous Q24H  . multivitamin with minerals  1 tablet Oral Daily  . pantoprazole  40 mg Oral Daily  . piperacillin-tazobactam (ZOSYN)  IV  3.375 g Intravenous Q8H  . senna-docusate  2 tablet Oral QHS  . simvastatin  20 mg Oral Daily  . sodium chloride  1,000 mL Intravenous Once  . sulfamethoxazole-trimethoprim  500 mg Intravenous NOW   Followed by  . [START ON 08/22/2015] sulfamethoxazole-trimethoprim  500 mg Intravenous Q8H   Continuous Infusions: . sodium chloride 75 mL/hr at 08/21/15 0219   PRN Meds:.acetaminophen **OR** acetaminophen, alum & mag hydroxide-simeth, diazepam, HYDROmorphone (DILAUDID) injection, neomycin-bacitracin-polymyxin, ondansetron **OR** ondansetron (ZOFRAN) IV, oxyCODONE  REVIEW OF SYSTEMS:    10 Point review of Systems  was done is negative except as noted above.   LABORATORY DATA:  I have reviewed the data as listed  . CBC Latest Ref Rng 08/21/2015 08/20/2015 08/19/2015  WBC 4.0 - 10.5 K/uL 9.6 9.8 10.2  Hemoglobin 13.0 - 17.0 g/dL 8.7(L) 8.9(L) 9.2(L)  Hematocrit 39.0 - 52.0 % 25.5(L) 26.3(L) 27.5(L)  Platelets 150 - 400 K/uL 231 242 173    . CMP Latest Ref Rng 08/21/2015 08/20/2015 08/19/2015  Glucose 65 - 99 mg/dL 102(H) 101(H) 103(H)  BUN 6 - 20 mg/dL '14 16 15  ' Creatinine 0.61 - 1.24 mg/dL 1.00 0.89 0.87  Sodium 135 - 145 mmol/L 137 136 137  Potassium 3.5 - 5.1 mmol/L 3.6 3.8 3.7  Chloride 101 - 111 mmol/L 107 107 106  CO2 22 - 32 mmol/L '24 24 24  ' Calcium 8.9 - 10.3 mg/dL 7.9(L) 8.1(L) 8.2(L)  Total Protein 6.5 - 8.1 g/dL - 6.0(L) 5.8(L)  Total Bilirubin 0.3 - 1.2  mg/dL - 0.2(L) 0.6  Alkaline Phos 38 - 126 U/L - 135(H) 118  AST 15 - 41 U/L - 67(H) 48(H)  ALT 17 - 63 U/L - 33 35     RADIOGRAPHIC STUDIES: I have personally reviewed the radiological images as listed and agreed with the findings in the report. Dg Chest 2 View  08/20/2015   CLINICAL DATA:  Fever, cough, dyspnea. One week out from first treatment for non-Hodgkin's lymphoma.  EXAM: CHEST  2 VIEW  COMPARISON:  08/19/2015  FINDINGS: There is developing alveolar opacity in the central lung bilaterally, a significant worsening from 08/19/2015. This may represent infectious infiltrate. Alveolar edema or hemorrhage is less likely. There are no pleural effusions. Heart size is unchanged.  IMPRESSION: Worsened central lung alveolar opacities bilaterally, suspicious for infectious infiltrates.   Electronically Signed   By: Andreas Newport M.D.   On: 08/20/2015 23:31   Dg Chest 2 View  08/19/2015   CLINICAL DATA:  Fever.  History of bone cancer.  EXAM: CHEST  2 VIEW  COMPARISON:  PET-CT - 07/10/2015; chest CT - 06/11/2015  FINDINGS: Grossly unchanged cardiac silhouette and mediastinal contours. There is mild diffuse slightly nodular thickening  of the pulmonary arch ischium, most conspicuous within the peripheral aspects of the bilateral upper/ mid lungs. Minimal bibasilar heterogeneous opacities favored to represent atelectasis or scar. No discrete focal airspace opacities. No pleural effusion or pneumothorax. No no acute osseous abnormalities. Post lower cervical ACDF, incompletely evaluated.  IMPRESSION: Findings suggestive of airways disease / bronchitis. No focal airspace opacities to suggest pneumonia.   Electronically Signed   By: Sandi Mariscal M.D.   On: 08/19/2015 09:24   Dg Cervical Spine 2-3 Views  07/26/2015   CLINICAL DATA:  Cervical spine fusion.  EXAM: DG C-ARM 61-120 MIN; CERVICAL SPINE - 2-3 VIEW  COMPARISON:  MRI 07/20/2015.  FINDINGS: C4 through C6 anterior interbody fusion. Hardware intact. Anatomic alignment. Two images. Fluoroscopic time 0 min 9 seconds.  IMPRESSION: C4 through C6 anterior and interbody fusion.   Electronically Signed   By: Marcello Moores  Register   On: 07/26/2015 15:57   Ct Angio Chest Pe W/cm &/or Wo Cm  08/21/2015   CLINICAL DATA:  Hypoxia, cough, and shortness of breath for 2 days. Pneumonia. Large B-cell lymphoma with ongoing chemotherapy. Productive cough with fever and chills.  EXAM: CT ANGIOGRAPHY CHEST WITH CONTRAST  TECHNIQUE: Multidetector CT imaging of the chest was performed using the standard protocol during bolus administration of intravenous contrast. Multiplanar CT image reconstructions and MIPs were obtained to evaluate the vascular anatomy.  CONTRAST:  122m OMNIPAQUE IOHEXOL 350 MG/ML SOLN  COMPARISON:  06/21/2015  FINDINGS: THORACIC INLET/BODY WALL:  No acute abnormality.  MEDIASTINUM:  Normal heart size. No pericardial effusion. Suboptimal pulmonary artery opacification due to bolus dispersion and intermittent motion, but diagnostic and negative for pulmonary embolism. Four vessel aortic arch without aortic dissection or aneurysm.  LUNG WINDOWS:  There is ground-glass patchy lung opacity with apical  predominance. The opacities are fairly symmetric. No interlobular septal thickening at the bases or in non opacified portions of the lungs. A subpleural nodule in the right lower lobe on lung series image 54 is stable, appearance favoring incidental lymph node. Mild dependent atelectasis and trace pleural effusions.  No intrathoracic adenopathy.  UPPER ABDOMEN:  Pneumobilia which is chronic.  No indication of abdominal pain.  OSSEOUS:  No acute fracture.  No suspicious lytic or blastic lesions.  Review of the MIP images confirms the  above findings.  IMPRESSION: 1. Bilateral airspace disease consistent with atypical pneumonia or noncardiogenic edema. 2. No evidence of pulmonary embolism. 3. Chronic pneumobilia.   Electronically Signed   By: Monte Fantasia M.D.   On: 08/21/2015 10:35   Mr Cervical Spine W Wo Contrast  08/07/2015   CLINICAL DATA:  Previous corpectomy at C5 due to tumor involvement. Unable to raise the right arm because of weakness. Symptoms began 08/05/2015. Personal history of lymphoma.  EXAM: MRI CERVICAL SPINE WITHOUT AND WITH CONTRAST  TECHNIQUE: Multiplanar and multiecho pulse sequences of the cervical spine, to include the craniocervical junction and cervicothoracic junction, were obtained according to standard protocol without and with intravenous contrast.  CONTRAST:  31m MULTIHANCE GADOBENATE DIMEGLUMINE 529 MG/ML IV SOLN  COMPARISON:  Preoperative study 07/20/2015  FINDINGS: No abnormality is seen above or below the operative region of C4 through C6. No osseous lesion. No stenosis of the canal or foramina.  In the operative region of C4 through C6, the patient has had corpectomy at C5 with anterior plate and screws from C4 through C6 with an intervening strut graft. The spinal canal is widely patent with ample subarachnoid space surrounding the cord. No evidence of cord insult.  There appears to be right foraminal involvement by tumor at C4-5 and C5-6. This could affect the C5 and C6  nerve roots.  IMPRESSION: Interval corpectomy at C5 with fusion from C4 through C6 with intervening strut graft. Wide patency of the central canal. No abnormal cord signal.  Enhancing tissue in the foraminal regions at C4-5 and C5-6 on the right consistent with tumor. This could affect the C5 and C6 nerve roots.   Electronically Signed   By: MNelson ChimesM.D.   On: 08/07/2015 14:58   Dg Fluoro Guide Spinal/si Jt Inj Left  08/09/2015   CLINICAL DATA:  69year old male with newly diagnosed diffuse large B-cell lymphoma.  EXAM: DIAGNOSTIC LUMBAR PUNCTURE UNDER FLUOROSCOPIC GUIDANCE  FLUOROSCOPY TIME:  54 seconds  PROCEDURE: Informed consent was obtained from the patient prior to the procedure, including potential complications of headache, allergy, and pain. With the patient prone, the lower back was prepped with Betadine. 1% Lidocaine was used for local anesthesia. Lumbar puncture was performed at the L2-L3 level using a 20 gauge needle with return of clearCSF with an opening pressure of 17 cm water. 6 ml of CSF were obtained for laboratory studies.  Subsequently, 12 mg of methotrexate (total volume of 10 mL) was slowly infused intrathecally. The needle was withdrawn from the patient, and a small bandage was placed over the entrance wound. The patient tolerated the procedure well and there were no apparent complications.  IMPRESSION: 1. Successful diagnostic and therapeutic lumbar puncture at L2-L3, foreign intrathecal methotrexate administration, as detailed above.   Electronically Signed   By: DVinnie LangtonM.D.   On: 08/09/2015 17:01   Dg Fluoro Guide Spinal/si Jt Inj Left  08/09/2015   DEtheleen Mayhew MD     08/09/2015  1:26 PM Lumbar puncture performed at L2-L3.  Opening pressure 17 cm H20.   6 ml CSF clear CSF collected and sent to laboratory for analysis  per orders of primary team.  12 mg Methorexate in total volume of  10 mL injected slowly.  Needle removed.  No bleeding or other  complications.   See full dictation in PACS.   Dg C-arm 1-60 Min  07/26/2015   CLINICAL DATA:  Cervical spine fusion.  EXAM: DG C-ARM 61-120 MIN; CERVICAL  SPINE - 2-3 VIEW  COMPARISON:  MRI 07/20/2015.  FINDINGS: C4 through C6 anterior interbody fusion. Hardware intact. Anatomic alignment. Two images. Fluoroscopic time 0 min 9 seconds.  IMPRESSION: C4 through C6 anterior and interbody fusion.   Electronically Signed   By: Marcello Moores  Register   On: 07/26/2015 15:57    ASSESSMENT & PLAN:   #1  Diffuse large B-cell lymphoma stage IV AE with involvement of mesenteric, inguinal lymph nodes and C5 vertebral body with spinal cord impingement.  He had a C5 corpectomy on 07/26/2015 that showed diffuse large B-cell lymphoma with a Ki-67 ranging from 20 to 90%.    DLBCL (diffuse large B cell lymphoma)   06/21/2015 Imaging CT Chest/abd/pelvis: IMPRESSION: 5 mm nodule within the right lower lobe as described.  Pneumobilia consistent with a prior sphincterotomy.  No acute abnormality is identified. No findings to suggest chest etiology for the known lesion at C5 are seen.   06/21/2015 Imaging MRI c spine1. Diffuse marrow replacement of the C5 vertebral body highly concerning for neoplasm (metastatic disease, myeloma, or lymphoma). Epidural tumor at this level results in moderate spinal stenosis with moderate impression on the spinal cord    07/10/2015 PET scan 1. The previously demonstrated abnormal C5 vertebral body is hypermetabolic. No other osseous lesions demonstrated. 2. There are small hypermetabolic lymph nodes within the left mesentery and both inguinal regions.    07/26/2015 Initial Biopsy Had a C5 corpectomy pathology showed diffuse large B-cell lymphoma   08/08/2015 -  Chemotherapy Started for cycle of R CHOP.    08/09/2015 -  Chemotherapy Received first cycle of intrathecal methotrexate plus hydrocortisone for CNS prophylaxis.   #2 ARDS with fevers and bilateral groundglass pulmonary opacities. Patient was briefly  neutropenic but has responded very well to Neulasta. Somewhat elevated pro-calcitonin levels did not allow for ruling out sepsis. Prolonged steroid use per neurosurgery would be a risk factor for PCP infection that could flare in the setting of chemotherapy and with resolution of neutropenia with Neulasta. Neulasta itself would increase the risk of ARDS like picture/pneumonitis in the setting of a lung infection/lung injury from chemotherapy and this typically presents with recovering WBC counts as with Mr. Saini.  Less likely possibility would be acute interstitial pneumonitis related to intrathecal methotrexate though this is much less likely.  Plan -Broad-spectrum antibiotic coverage. -Agree with empiric PCP/PJP coverage with Bactrim -We'll DC allopurinol to reduce drug interactions given low risk of tumor lysis syndrome at this time. -We will have to avoid using any G-CSF in the future. -Would consult infectious diseases and pulmonary teams. -Noninvasive lung protective Ventilation. -Agree with dexamethasone 4 mg IV every 12 from a perspective of PCP as well as G-CSF related pneumonitis. -ProBNP, troponin T to rule out cardiac stressors. Patient has some leg edema likely from hypoalbuminemia and steroids. His recent echocardiogram had shown normal EF but he did receive cardiotoxic chemotherapy including anthracyclines. -Continue close monitoring. -Low threshold for bronchoscopy for microbiologic diagnosis. -Urinary strep pneumoniae, Legionella, induce sputum for PCP. -Viral respiratory panel stat. -We'll check IgG levels to rule out hypogammaglobulinemia. Would consider IVIG if levels low.  I will continue to follow this patient daily. Please call if any questions arise in the interim. Appreciate excellent hospitalist cares. Discussed with Dr. Ala Dach.  I spent 25 minutes counseling the patient face to face. The total time spent in the appointment was 40 minutes and more than 50% was on  counseling and direct patient cares.    Sullivan Lone MD  MS AAHIVMS [facility] Hematology/Oncology Physician [place]  (Office):       [phone] (Work cell):  [phone] (Fax):           [phone]  08/21/2015 5:01 PM

## 2015-08-21 NOTE — Progress Notes (Signed)
ANTIBIOTIC CONSULT NOTE - INITIAL  Pharmacy Consult for Bactrim Indication: Pneumocystis pneumonia  No Known Allergies  Patient Measurements: Height: 6\' 1"  (185.4 cm) Weight: 214 lb 8.1 oz (97.3 kg) IBW/kg (Calculated) : 79.9 Adjusted Body Weight:   Vital Signs: Temp: 103.3 F (39.6 C) (08/29 1608) Temp Source: Rectal (08/29 1608) BP: 166/71 mmHg (08/29 1600) Pulse Rate: 111 (08/29 1608) Intake/Output from previous day: 08/28 0701 - 08/29 0700 In: 445 [P.O.:240; I.V.:205] Out: -  Intake/Output from this shift: Total I/O In: 125 [I.V.:75; IV Piggyback:50] Out: 400 [Urine:400]  Labs:  Recent Labs  08/19/15 1015 08/20/15 2241 08/21/15 0316  WBC 10.2 9.8 9.6  HGB 9.2* 8.9* 8.7*  PLT 173 242 231  CREATININE 0.87 0.89 1.00   Estimated Creatinine Clearance: 85.7 mL/min (by C-G formula based on Cr of 1). No results for input(s): VANCOTROUGH, VANCOPEAK, VANCORANDOM, GENTTROUGH, GENTPEAK, GENTRANDOM, TOBRATROUGH, TOBRAPEAK, TOBRARND, AMIKACINPEAK, AMIKACINTROU, AMIKACIN in the last 72 hours.   Microbiology: Recent Results (from the past 720 hour(s))  Surgical pcr screen     Status: None   Collection Time: 07/26/15 12:27 PM  Result Value Ref Range Status   MRSA, PCR NEGATIVE NEGATIVE Final   Staphylococcus aureus NEGATIVE NEGATIVE Final    Comment:        The Xpert SA Assay (FDA approved for NASAL specimens in patients over 69 years of age), is one component of a comprehensive surveillance program.  Test performance has been validated by [facility] for patients greater than or equal to 69 year old. It is not intended to diagnose infection nor to guide or monitor treatment.     Medical History: Past Medical History  Diagnosis Date  . History of bleeding ulcers 1990's  . 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 Anxiety   . GERD (gastroesophageal reflux disease)   . H. pylori infection   . History of blood transfusion   . Bone cancer      tumor on C5  . Arm  numbness 08/07/15    Limited movement due to tumor on spine  . Diffuse large B cell lymphoma     biposy 07/25/2105   Assessment: 69 yoM with recent diagnosis of stage IV DLBCL on chemotherapy admitted 8/28 with fevers and SOB and decline in respiratory function.  Pt started on broad spectrum antibiotics for HCAP and ID consulted.  Pharmacy consulted to start bactrim for possible pneumocystis pneumonia.    ABW = 86.8kg.  Anti-infectives 8/28 >>zosyn >> 8/29 >>vanc >> 8/29 8/29 >>azithromycin >>  8/29 >> bactrim >>  Vitals/Labs WBC: WNL Tm24h: 103.3 SCr: 1.00, sl increase from admission, CrCl ~86 ml/min (N71)  Cultures 8/29 bloodx2: collected 8/29 urine: collected  8/29 legionella and strep pneumoniae urine antigens: needs to be collected 8/29 pneumocystis smear: needs to be collected  Goal of Therapy:  Eradication of infection  Plan:  Bactrim 500mg  IV q8h (~17 mg/kg/day trimethoprim component using ABW) F/u renal fxn, cultures, fever, clinical course  Ralene Bathe, PharmD, BCPS 08/21/2015, 4:56 PM  Pager: [phone]

## 2015-08-21 NOTE — H&P (Signed)
[facility] Admission History and Physical       Ethan Stewart WIO:[id number] DOB: 10-28-1946 DOA: 08/20/2015  Referring physician: EDP PCP:  Melinda Crutch, MD  Specialists:   Chief Complaint: Fever Chills and Cough  HPI: Ethan Stewart is a 69 y.o. male with a history of Diffuse Large B-Cell Lymphoma diagnosed 07/2015 on [facility] Chemo Rx last given on 08/09/2015 who presents to the ED with complaints of fevers and chills and cough with SOB x 2 days.    He was evaluated in the ED and on chest X-ray was found to have pneumonia and an elevated lactic Acid level . A Sepsis/HCAP workup was initiated and he was placed on IV Vancomycin and Zosyn and referred for admission.      Review of Systems:  Constitutional: No Weight Loss, No Weight Gain, Night Sweats, +Fevers, +Chills, Dizziness, Light Headedness, Fatigue, or Generalized Weakness HEENT: No Headaches, Difficulty Swallowing,Tooth/Dental Problems,Sore Throat,  No Sneezing, Rhinitis, Ear Ache, Nasal Congestion, or Post Nasal Drip,  Cardio-vascular:  No Chest pain, Orthopnea, PND, Edema in Lower Extremities, Anasarca, Dizziness, Palpitations  Resp: +Dyspnea, No DOE, +Productive Cough, No Non-Productive Cough, No Hemoptysis, No Wheezing.    GI: No Heartburn, Indigestion, Abdominal Pain, Nausea, Vomiting, Diarrhea, Constipation, Hematemesis, Hematochezia, Melena, Change in Bowel Habits,  Loss of Appetite  GU: No Dysuria, No Change in Color of Urine, No Urgency or Urinary Frequency, No Flank pain.  Musculoskeletal: No Joint Pain or Swelling, No Decreased Range of Motion, No Back Pain.  Neurologic: No Syncope, No Seizures, Muscle Weakness, Paresthesia, Vision Disturbance or Loss, No Diplopia, No Vertigo, No Difficulty Walking,  Skin: No Rash or Lesions. Psych: No Change in Mood or Affect, No Depression or Anxiety, No Memory loss, No Confusion, or Hallucinations   Past Medical History  Diagnosis Date  . History of bleeding ulcers  1990's  . 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 Anxiety   . GERD (gastroesophageal reflux disease)   . H. pylori infection   . History of blood transfusion   . Bone cancer      tumor on C5  . Arm numbness 08/07/15    Limited movement due to tumor on spine  . Diffuse large B cell lymphoma     biposy 07/25/2105     Past Surgical History  Procedure Laterality Date  . Hernia repair Bilateral     with mesh  . Colonoscopy    . Eye surgery Right     catheter  . Anterior cervical corpectomy N/A 07/26/2015    Procedure: Cervical five Corpectomy/Cervical four-six Fusion/Plate ;  Surgeon: Eustace Moore, MD;  Location: [facility];  Service: Neurosurgery;  Laterality: N/A;  C5 Corpectomy/C4-6 Fusion/Plate C4-6      Prior to Admission medications   Medication Sig Start Date End Date Taking? Authorizing Provider  acetaminophen (TYLENOL) 500 MG tablet Take 1,000 mg by mouth every 6 (six) hours as needed for mild pain, fever or headache.    Yes Historical Provider, MD  allopurinol (ZYLOPRIM) 300 MG tablet Take 1 tablet (300 mg total) by mouth daily. 08/08/15  Yes Brunetta Genera, MD  diazepam (VALIUM) 5 MG tablet Take 5 mg by mouth every 6 (six) hours as needed for anxiety.    Yes Historical Provider, MD  HYDROcodone-acetaminophen (NORCO/VICODIN) 5-325 MG per tablet Take 1 tablet by mouth every 4 (four) hours as needed (mild pain). 07/27/15  Yes Eustace Moore, MD  levofloxacin (LEVAQUIN) 750 MG tablet Take 1 tablet (  750 mg total) by mouth daily. IF fever develops while neutropenic. Call doctor immediately. 08/15/15  Yes Brunetta Genera, MD  LORazepam (ATIVAN) 0.5 MG tablet Take 1 tablet (0.5 mg total) by mouth every 6 (six) hours as needed (Nausea or vomiting). Patient taking differently: Take 0.5 mg by mouth at bedtime.  08/08/15  Yes Brunetta Genera, MD  Multiple Vitamin (MULTIVITAMIN WITH MINERALS) TABS tablet Take 1 tablet by mouth daily.   Yes Historical Provider, MD    neomycin-bacitracin-polymyxin (NEOSPORIN) ointment Apply 1 application topically as needed for wound care (left foot blister). apply to eye   Yes Historical Provider, MD  omeprazole (PRILOSEC) 40 MG capsule Take 1 capsule (40 mg total) by mouth daily. 07/24/15  Yes Brunetta Genera, MD  senna-docusate (SENNA S) 8.6-50 MG per tablet Take 2 tablets by mouth at bedtime. 08/07/15  Yes Brunetta Genera, MD  simvastatin (ZOCOR) 20 MG tablet Take 20 mg by mouth daily.   Yes Historical Provider, MD  lidocaine-prilocaine (EMLA) cream Apply to affected area once 08/08/15   Brunetta Genera, MD  ondansetron (ZOFRAN) 8 MG tablet Take 1 tablet (8 mg total) by mouth 2 (two) times daily. Start the day after chemo for 3 days. Then as needed for nausea or vomiting. 08/08/15   Brunetta Genera, MD  predniSONE (DELTASONE) 20 MG tablet Take 4.5 tablets (90 mg total) by mouth daily. Take on days 1-5 of chemotherapy. Patient not taking: Reported on 08/20/2015 08/08/15   Brunetta Genera, MD  [place]    Historical Provider, MD  prochlorperazine (COMPAZINE) 10 MG tablet Take 1 tablet (10 mg total) by mouth every 6 (six) hours as needed (Nausea or vomiting). 08/08/15   Brunetta Genera, MD     No Known Allergies  Social History:  reports that he quit smoking about 33 years ago. His smoking use included Cigarettes. He has a 10 pack-year smoking history. He has never used smokeless tobacco. He reports that he drinks about 3.0 oz of alcohol per week. He reports that he does not use illicit drugs.    Family History  Problem Relation Age of Onset  . Hypertension Mother        Physical Exam:  GEN:  Pleasant Well Nourished and Well Developed  69 y.o. Caucasian male examined and in no acute distress; cooperative with exam Filed Vitals:   08/20/15 2120  BP: 145/77  Pulse: 112  Temp: 99.3 F (37.4 C)  TempSrc: Oral  Resp: 18  SpO2: 90%   Blood pressure 145/77, pulse 112,  temperature 99.3 F (37.4 C), temperature source Oral, resp. rate 18, SpO2 90 %. PSYCH: He is alert and oriented x4; does not appear anxious does not appear depressed; affect is normal HEENT: Normocephalic and Atraumatic, Mucous membranes pink; PERRLA; EOM intact; Fundi:  Benign;  No scleral icterus, Nares: Patent, Oropharynx: Clear,  Fair Dentition,    Neck:  FROM, No Cervical Lymphadenopathy nor Thyromegaly or Carotid Bruit; No JVD; Breasts:: Not examined CHEST WALL: No tenderness CHEST: Normal respiration, clear to auscultation bilaterally HEART: Regular rate and rhythm; no murmurs rubs or gallops BACK: No kyphosis or scoliosis; No CVA tenderness ABDOMEN: Positive Bowel Sounds, Soft Non-Tender, No Rebound or Guarding; No Masses, No Organomegaly, No Pannus; No Intertriginous candida. Rectal Exam: Not done EXTREMITIES: No Cyanosis, Clubbing, 2+ Pedal Edema; +Ulcerations Left lateral Foot 1st MTP area Genitalia: not examined PULSES: 2+ and symmetric SKIN: Normal hydration no rash or ulceration CNS:  Alert and Oriented x 4, No Focal Deficits Vascular: pulses palpable throughout    Labs on Admission:  Basic Metabolic Panel:  Recent Labs Lab 08/15/15 0809 08/19/15 1015 08/20/15 2241  NA 138 137 136  K 4.2 3.7 3.8  CL  --  106 107  CO2 24 24 24   GLUCOSE 86 103* 101*  BUN 12.4 15 16   CREATININE 0.8 0.87 0.89  CALCIUM 9.0 8.2* 8.1*  MG 2.0  --   --   PHOS 2.4  --   --    Liver Function Tests:  Recent Labs Lab 08/15/15 0809 08/19/15 1015 08/20/15 2241  AST 15 48* 67*  ALT 35 35 33  ALKPHOS 77 118 135*  BILITOT 0.67 0.6 0.2*  PROT 5.8* 5.8* 6.0*  ALBUMIN 2.8* 2.5* 2.4*   No results for input(s): LIPASE, AMYLASE in the last 168 hours. No results for input(s): AMMONIA in the last 168 hours. CBC:  Recent Labs Lab 08/15/15 0809 08/19/15 1015 08/20/15 2241  WBC 0.5* 10.2 9.8  NEUTROABS 0.1* 9.3* 8.4*  HGB 10.5* 9.2* 8.9*  HCT 30.5* 27.5* 26.3*  MCV 86.7 85.9 85.7    PLT 154 173 242   Cardiac Enzymes: No results for input(s): CKTOTAL, CKMB, CKMBINDEX, TROPONINI in the last 168 hours.  BNP (last 3 results) No results for input(s): BNP in the last 8760 hours.  ProBNP (last 3 results) No results for input(s): PROBNP in the last 8760 hours.  CBG: No results for input(s): GLUCAP in the last 168 hours.  Radiological Exams on Admission: Dg Chest 2 View  08/20/2015   CLINICAL DATA:  Fever, cough, dyspnea. One week out from first treatment for non-Hodgkin's lymphoma.  EXAM: CHEST  2 VIEW  COMPARISON:  08/19/2015  FINDINGS: There is developing alveolar opacity in the central lung bilaterally, a significant worsening from 08/19/2015. This may represent infectious infiltrate. Alveolar edema or hemorrhage is less likely. There are no pleural effusions. Heart size is unchanged.  IMPRESSION: Worsened central lung alveolar opacities bilaterally, suspicious for infectious infiltrates.   Electronically Signed   By: Andreas Newport M.D.   On: 08/20/2015 23:31   Dg Chest 2 View  08/19/2015   CLINICAL DATA:  Fever.  History of bone cancer.  EXAM: CHEST  2 VIEW  COMPARISON:  PET-CT - 07/10/2015; chest CT - 06/11/2015  FINDINGS: Grossly unchanged cardiac silhouette and mediastinal contours. There is mild diffuse slightly nodular thickening of the pulmonary arch ischium, most conspicuous within the peripheral aspects of the bilateral upper/ mid lungs. Minimal bibasilar heterogeneous opacities favored to represent atelectasis or scar. No discrete focal airspace opacities. No pleural effusion or pneumothorax. No no acute osseous abnormalities. Post lower cervical ACDF, incompletely evaluated.  IMPRESSION: Findings suggestive of airways disease / bronchitis. No focal airspace opacities to suggest pneumonia.   Electronically Signed   By: Sandi Mariscal M.D.   On: 08/19/2015 09:24     EKG: Independently reviewed. Sinus Tachycardia Rate = 112        Assessment/Plan:     69 y.o.  male with  Principal Problem:   1.   Sepsis/HCAP (healthcare-associated pneumonia)   IV Vancomycin and Zosyn   IVFs   Active Problems:     2.   DLBCL (diffuse large B cell lymphoma)   Notify Oncology in AM    See Dr Irene Limbo     3.   Sinus tachycardia   IVFs     4.   Anemia associated with chemotherapy  Monitor Trend     5.   GERD (gastroesophageal reflux disease)   Protonix     6.   DVT Prophylaxis   Lovenox    Code Status:     FULL CODE   Family Communication:   Wife at Bedside       Disposition Plan:    Inpatient  Status        Time spent:   63 Minutes      Theressa Millard [facility] Pager [phone]   If [place] Please Contact the Day Rounding Team MD for [facility]  If 7PM-7AM, Please Contact Night-Floor Coverage  [email or web address] Password [email or web address] 08/21/2015, 12:29 AM     ADDENDUM:   Patient was seen and examined on 08/21/2015

## 2015-08-21 NOTE — Consult Note (Signed)
[facility]  Total days of antibiotics 2        Day 2 vanco        Day 2 piptazo        Day 1 azithro       Reason for Consult: atypical pneumonia in immunocompromised host    Referring Physician: rizwan  Principal Problem:   HCAP (healthcare-associated pneumonia) Active Problems:   DLBCL (diffuse large B cell lymphoma)   Sepsis   Anemia associated with chemotherapy   Sinus tachycardia   GERD (gastroesophageal reflux disease)   Blood poisoning   Hypoxia   Acute on chronic respiratory failure with hypoxia    HPI: Ethan Stewart is a 69 y.o. male with recent diagnosis of Diffuse large B-cell lymphoma Stage IV AE with inguinal and mesenteric lymphadenopathy plus extranodal involvement of C5 vertebra in June-July 2016,  status post C5 corpectomy on 8/3. He is s/p 1st cycle of RCHOP with IT MTX plus hydrocortisone for CNS prophylaxis on 8/17. He was seen in Oncology clinic on 8/26 doing fairly well, neutropenic but no signs of infection. He was given GCSF and neutropenic precautions. He was admitted on 8/28 for fevers of 100.8 with shortness of breath. Over the coures of hte last day his respiratory status has declined, now wearing venti-mask and noted hypoxia on abg.pO2 of 66%. He has ongoing fevers of 103F, cxr and chest CT showing diffuse patchy infiltrates. Ruled out  For PE. He had not been on any abtx prophylaxis. On abdmit, WBC of 9.8 with 86% N but he did receive GCSF last week. No other symptoms other than fever, shortness of breath with little exertion, DOE. No diarrhea. Still has residual weakness of right arm from cord compression for C5 tumor for which he gets physical therapy. No sick contacts  Past Medical History  Diagnosis Date  . History of bleeding ulcers 1990's  . 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 Anxiety   . GERD (gastroesophageal reflux disease)   . H. pylori infection   . History of blood transfusion   . Bone cancer      tumor on C5    . Arm numbness 08/07/15    Limited movement due to tumor on spine  . Diffuse large B cell lymphoma     biposy 07/25/2105    i have reviewed his past medical records with oncology and neurosurgery.  Allergies: No Known Allergies  MEDICATIONS: . allopurinol  300 mg Oral Daily  . azithromycin  500 mg Oral Daily  . dexamethasone  4 mg Intravenous Once  . enoxaparin (LOVENOX) injection  40 mg Subcutaneous Q24H  . multivitamin with minerals  1 tablet Oral Daily  . pantoprazole  40 mg Oral Daily  . piperacillin-tazobactam (ZOSYN)  IV  3.375 g Intravenous Q8H  . senna-docusate  2 tablet Oral QHS  . simvastatin  20 mg Oral Daily  . sodium chloride  1,000 mL Intravenous Once  . vancomycin  1,250 mg Intravenous Q12H    Social History  Substance Use Topics  . Smoking status: Former Smoker -- 1.00 packs/day for 10 years    Types: Cigarettes    Quit date: 12/23/1981  . Smokeless tobacco: Never Used  . Alcohol Use: 3.0 oz/week    2 Glasses of wine, 3 Cans of beer per week    Family History  Problem Relation Age of Onset  . Hypertension Mother     Review of Systems - 10 point ros  reviewed, positive pertinents listed in hpi  OBJECTIVE: Temp:  [98.1 F (36.7 C)-100.3 F (37.9 C)] 98.1 F (36.7 C) (08/29 1200) Pulse Rate:  [99-114] 102 (08/29 1300) Resp:  [18-48] 41 (08/29 1300) BP: (139-162)/(62-79) 162/72 mmHg (08/29 1300) SpO2:  [87 %-96 %] 96 % (08/29 1300) FiO2 (%):  [5 %-50 %] 50 % (08/29 1000) Weight:  [210 lb 8.6 oz (95.5 kg)-214 lb 8.1 oz (97.3 kg)] 214 lb 8.1 oz (97.3 kg) (08/29 1200) Physical Exam  Constitutional: He is oriented to person, place, and time. He appears well-developed and well-nourished. No distress.  HENT:  Mouth/Throat: Oropharynx is clear and moist. No oropharyngeal exudate.  Neck: wearing c-collar Cardiovascular: Normal rate, regular rhythm and normal heart sounds. Exam reveals no gallop and no friction rub.  No murmur heard.  pulm = soft  bilateral rhonchi ,no wheezing, no accessory m. used Abdominal: Soft. Bowel sounds are normal. He exhibits no distension. There is no tenderness.  Neurological: He is alert and oriented to person, place, and time. 4/5 right grip strength Skin: Skin is warm and dry. No rash noted. No erythema.  Psychiatric: He has a normal mood and affect. His behavior is normal.     LABS: Results for orders placed or performed during the hospital encounter of 08/20/15 (from the past 48 hour(s))  CBC with Differential     Status: Abnormal   Collection Time: 08/20/15 10:41 PM  Result Value Ref Range   WBC 9.8 4.0 - 10.5 K/uL   RBC 3.07 (L) 4.22 - 5.81 MIL/uL   Hemoglobin 8.9 (L) 13.0 - 17.0 g/dL   HCT 26.3 (L) 39.0 - 52.0 %   MCV 85.7 78.0 - 100.0 fL   MCH 29.0 26.0 - 34.0 pg   MCHC 33.8 30.0 - 36.0 g/dL   RDW 17.0 (H) 11.5 - 15.5 %   Platelets 242 150 - 400 K/uL   Neutrophils Relative % 86 (H) 43 - 77 %   Lymphocytes Relative 7 (L) 12 - 46 %   Monocytes Relative 7 3 - 12 %   Eosinophils Relative 0 0 - 5 %   Basophils Relative 0 0 - 1 %   Neutro Abs 8.4 (H) 1.7 - 7.7 K/uL   Lymphs Abs 0.7 0.7 - 4.0 K/uL   Monocytes Absolute 0.7 0.1 - 1.0 K/uL   Eosinophils Absolute 0.0 0.0 - 0.7 K/uL   Basophils Absolute 0.0 0.0 - 0.1 K/uL   RBC Morphology POLYCHROMASIA PRESENT    WBC Morphology MILD LEFT SHIFT (1-5% METAS, OCC MYELO, OCC BANDS)   Comprehensive metabolic panel     Status: Abnormal   Collection Time: 08/20/15 10:41 PM  Result Value Ref Range   Sodium 136 135 - 145 mmol/L   Potassium 3.8 3.5 - 5.1 mmol/L   Chloride 107 101 - 111 mmol/L   CO2 24 22 - 32 mmol/L   Glucose, Bld 101 (H) 65 - 99 mg/dL   BUN 16 6 - 20 mg/dL   Creatinine, Ser 0.89 0.61 - 1.24 mg/dL   Calcium 8.1 (L) 8.9 - 10.3 mg/dL   Total Protein 6.0 (L) 6.5 - 8.1 g/dL   Albumin 2.4 (L) 3.5 - 5.0 g/dL   AST 67 (H) 15 - 41 U/L   ALT 33 17 - 63 U/L   Alkaline Phosphatase 135 (H) 38 - 126 U/L   Total Bilirubin 0.2 (L) 0.3 - 1.2  mg/dL   GFR calc non Af Amer >60 >60 mL/min   GFR  calc Af Amer >60 >60 mL/min    Comment: (NOTE) The eGFR has been calculated using the CKD EPI equation. This calculation has not been validated in all clinical situations. eGFR's persistently <60 mL/min signify possible Chronic Kidney Disease.    Anion gap 5 5 - 15  I-Stat CG4 Lactic Acid, ED     Status: Abnormal   Collection Time: 08/20/15 10:49 PM  Result Value Ref Range   Lactic Acid, Venous 2.10 (HH) 0.5 - 2.0 mmol/L   Comment NOTIFIED PHYSICIAN   Urinalysis, Routine w reflex microscopic (not at [facility])     Status: Abnormal   Collection Time: 08/21/15 12:59 AM  Result Value Ref Range   Color, Urine YELLOW YELLOW   APPearance CLEAR CLEAR   Specific Gravity, Urine 1.030 1.005 - 1.030   pH 5.5 5.0 - 8.0   Glucose, UA NEGATIVE NEGATIVE mg/dL   Hgb urine dipstick NEGATIVE NEGATIVE   Bilirubin Urine NEGATIVE NEGATIVE   Ketones, ur NEGATIVE NEGATIVE mg/dL   Protein, ur 30 (A) NEGATIVE mg/dL   Urobilinogen, UA 1.0 0.0 - 1.0 mg/dL   Nitrite NEGATIVE NEGATIVE   Leukocytes, UA NEGATIVE NEGATIVE  Urine microscopic-add on     Status: None   Collection Time: 08/21/15 12:59 AM  Result Value Ref Range   WBC, UA 0-2 <3 WBC/hpf   Urine-Other MUCOUS PRESENT   Lactic acid, plasma     Status: None   Collection Time: 08/21/15  3:16 AM  Result Value Ref Range   Lactic Acid, Venous 1.6 0.5 - 2.0 mmol/L  Procalcitonin     Status: None   Collection Time: 08/21/15  3:16 AM  Result Value Ref Range   Procalcitonin 1.98 ng/mL    Comment:        Interpretation: PCT > 0.5 ng/mL and <= 2 ng/mL: Systemic infection (sepsis) is possible, but other conditions are known to elevate PCT as well. (NOTE)         ICU PCT Algorithm               Non ICU PCT Algorithm    ----------------------------     ------------------------------         PCT < 0.25 ng/mL                 PCT < 0.1 ng/mL     Stopping of antibiotics            Stopping of antibiotics        strongly encouraged.               strongly encouraged.    ----------------------------     ------------------------------       PCT level decrease by               PCT < 0.25 ng/mL       >= 80% from peak PCT       OR PCT 0.25 - 0.5 ng/mL          Stopping of antibiotics                                             encouraged.     Stopping of antibiotics           encouraged.    ----------------------------     ------------------------------       PCT level decrease by  PCT >= 0.25 ng/mL       < 80% from peak PCT        AND PCT >= 0.5 ng/mL             Continuing antibiotics                                              encouraged.       Continuing antibiotics            encouraged.    ----------------------------     ------------------------------     PCT level increase compared          PCT > 0.5 ng/mL         with peak PCT AND          PCT >= 0.5 ng/mL             Escalation of antibiotics                                          strongly encouraged.      Escalation of antibiotics        strongly encouraged.   Protime-INR     Status: Abnormal   Collection Time: 08/21/15  3:16 AM  Result Value Ref Range   Prothrombin Time 16.9 (H) 11.6 - 15.2 seconds   INR 1.36 0.00 - 1.49  APTT     Status: Abnormal   Collection Time: 08/21/15  3:16 AM  Result Value Ref Range   aPTT 54 (H) 24 - 37 seconds    Comment:        IF BASELINE aPTT IS ELEVATED, SUGGEST PATIENT RISK ASSESSMENT BE USED TO DETERMINE APPROPRIATE ANTICOAGULANT THERAPY.   Basic metabolic panel     Status: Abnormal   Collection Time: 08/21/15  3:16 AM  Result Value Ref Range   Sodium 137 135 - 145 mmol/L   Potassium 3.6 3.5 - 5.1 mmol/L   Chloride 107 101 - 111 mmol/L   CO2 24 22 - 32 mmol/L   Glucose, Bld 102 (H) 65 - 99 mg/dL   BUN 14 6 - 20 mg/dL   Creatinine, Ser 1.00 0.61 - 1.24 mg/dL   Calcium 7.9 (L) 8.9 - 10.3 mg/dL   GFR calc non Af Amer >60 >60 mL/min   GFR calc Af Amer >60 >60 mL/min     Comment: (NOTE) The eGFR has been calculated using the CKD EPI equation. This calculation has not been validated in all clinical situations. eGFR's persistently <60 mL/min signify possible Chronic Kidney Disease.    Anion gap 6 5 - 15  CBC     Status: Abnormal   Collection Time: 08/21/15  3:16 AM  Result Value Ref Range   WBC 9.6 4.0 - 10.5 K/uL   RBC 2.98 (L) 4.22 - 5.81 MIL/uL   Hemoglobin 8.7 (L) 13.0 - 17.0 g/dL   HCT 25.5 (L) 39.0 - 52.0 %   MCV 85.6 78.0 - 100.0 fL   MCH 29.2 26.0 - 34.0 pg   MCHC 34.1 30.0 - 36.0 g/dL   RDW 17.1 (H) 11.5 - 15.5 %   Platelets 231 150 - 400 K/uL  Lactic acid, plasma     Status: None   Collection Time: 08/21/15  6:15 AM  Result Value Ref Range   Lactic Acid, Venous 1.2 0.5 - 2.0 mmol/L  Blood gas, arterial     Status: Abnormal   Collection Time: 08/21/15 12:38 PM  Result Value Ref Range   FIO2 1.00    Delivery systems NON-REBREATHER OXYGEN MASK    pH, Arterial 7.472 (H) 7.350 - 7.450   pCO2 arterial 31.9 (L) 35.0 - 45.0 mmHg   pO2, Arterial 66.6 (L) 80.0 - 100.0 mmHg   Bicarbonate 23.0 20.0 - 24.0 mEq/L   TCO2 21.4 0 - 100 mmol/L   Acid-Base Excess 0.1 0.0 - 2.0 mmol/L   O2 Saturation 92.8 %   Patient temperature 98.6    Collection site RIGHT RADIAL    Drawn by [phone]    Sample type ARTERIAL DRAW    Allens test (pass/fail) PASS PASS  Lactic acid, plasma     Status: None   Collection Time: 08/21/15  1:00 PM  Result Value Ref Range   Lactic Acid, Venous 1.6 0.5 - 2.0 mmol/L    MICRO: 8/28 blood cx x 1 pending 8/29 urine cx x 1 pending IMAGING: i have reviewed CT and CXR and concur with findings of diffuse alveolar infiltrates  Dg Chest 2 View  08/20/2015   CLINICAL DATA:  Fever, cough, dyspnea. One week out from first treatment for non-Hodgkin's lymphoma.  EXAM: CHEST  2 VIEW  COMPARISON:  08/19/2015  FINDINGS: There is developing alveolar opacity in the central lung bilaterally, a significant worsening from 08/19/2015. This may  represent infectious infiltrate. Alveolar edema or hemorrhage is less likely. There are no pleural effusions. Heart size is unchanged.  IMPRESSION: Worsened central lung alveolar opacities bilaterally, suspicious for infectious infiltrates.   Electronically Signed   By: Andreas Newport M.D.   On: 08/20/2015 23:31   Ct Angio Chest Pe W/cm &/or Wo Cm  08/21/2015   CLINICAL DATA:  Hypoxia, cough, and shortness of breath for 2 days. Pneumonia. Large B-cell lymphoma with ongoing chemotherapy. Productive cough with fever and chills.  EXAM: CT ANGIOGRAPHY CHEST WITH CONTRAST  TECHNIQUE: Multidetector CT imaging of the chest was performed using the standard protocol during bolus administration of intravenous contrast. Multiplanar CT image reconstructions and MIPs were obtained to evaluate the vascular anatomy.  CONTRAST:  139m OMNIPAQUE IOHEXOL 350 MG/ML SOLN  COMPARISON:  06/21/2015  FINDINGS: THORACIC INLET/BODY WALL:  No acute abnormality.  MEDIASTINUM:  Normal heart size. No pericardial effusion. Suboptimal pulmonary artery opacification due to bolus dispersion and intermittent motion, but diagnostic and negative for pulmonary embolism. Four vessel aortic arch without aortic dissection or aneurysm.  LUNG WINDOWS:  There is ground-glass patchy lung opacity with apical predominance. The opacities are fairly symmetric. No interlobular septal thickening at the bases or in non opacified portions of the lungs. A subpleural nodule in the right lower lobe on lung series image 54 is stable, appearance favoring incidental lymph node. Mild dependent atelectasis and trace pleural effusions.  No intrathoracic adenopathy.  UPPER ABDOMEN:  Pneumobilia which is chronic.  No indication of abdominal pain.  OSSEOUS:  No acute fracture.  No suspicious lytic or blastic lesions.  Review of the MIP images confirms the above findings.  IMPRESSION: 1. Bilateral airspace disease consistent with atypical pneumonia or noncardiogenic edema.  2. No evidence of pulmonary embolism. 3. Chronic pneumobilia.   Electronically Signed   By: JMonte FantasiaM.D.   On: 08/21/2015 10:35    Assessment/Plan:  89yoM with DLBCL with CNS involvement s/p  1 st cycle of R-CHOP and IT MTX on 8/17, received GCSF last week, now presents with fever of 103F, acute respiratory distress and radiographic findings suggestive of atypical pneumonia. Currently on vancmycin plus piptazo. Etiology could be hcap pathogens such as pseudomonas or mrsa, but quick decompensation is concerning for PCP pneumonia  - will change regimen to bactrim plus piptazo. Can d/c vancomycin for now - there is benefit to steroids with pcp, will change dexamethasone dose to 81m BID - will check pcp stain, ur legionella, strep pneumo ur antigen - if he gets intubated, please as pulmonary to send BAL for culture - please get sputum culture if he has productive cough  Spent 60 min with greater than 50% in reviewing records, and counseling patient  CCaren GriffinsB. [facility] [phone]

## 2015-08-21 NOTE — Progress Notes (Signed)
ANTIBIOTIC CONSULT NOTE - INITIAL  Pharmacy Consult for Vancomycin & Zosyn Indication: pneumonia  No Known Allergies  Patient Measurements: Height: 6\' 1"  (185.4 cm) Weight: 210 lb 8.6 oz (95.5 kg) IBW/kg (Calculated) : 79.9  Vital Signs: Temp: 98.6 F (37 C) (08/29 0203) Temp Source: Oral (08/29 0203) BP: 161/68 mmHg (08/29 0203) Pulse Rate: 99 (08/29 0203) Intake/Output from previous day:   Intake/Output from this shift:    Labs:  Recent Labs  08/19/15 1015 08/20/15 2241  WBC 10.2 9.8  HGB 9.2* 8.9*  PLT 173 242  CREATININE 0.87 0.89   Estimated Creatinine Clearance: 88.5 mL/min (by C-G formula based on Cr of 0.89). No results for input(s): VANCOTROUGH, VANCOPEAK, VANCORANDOM, GENTTROUGH, GENTPEAK, GENTRANDOM, TOBRATROUGH, TOBRAPEAK, TOBRARND, AMIKACINPEAK, AMIKACINTROU, AMIKACIN in the last 72 hours.   Microbiology: Recent Results (from the past 720 hour(s))  Surgical pcr screen     Status: None   Collection Time: 07/26/15 12:27 PM  Result Value Ref Range Status   MRSA, PCR NEGATIVE NEGATIVE Final   Staphylococcus aureus NEGATIVE NEGATIVE Final    Comment:        The Xpert SA Assay (FDA approved for NASAL specimens in patients over 13 years of age), is one component of a comprehensive surveillance program.  Test performance has been validated by [facility] for patients greater than or equal to 59 year old. It is not intended to diagnose infection nor to guide or monitor treatment.     Medical History: Past Medical History  Diagnosis Date  . History of bleeding ulcers 1990's  . 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 Anxiety   . GERD (gastroesophageal reflux disease)   . H. pylori infection   . History of blood transfusion   . Bone cancer      tumor on C5  . Arm numbness 08/07/15    Limited movement due to tumor on spine  . Diffuse large B cell lymphoma     biposy 07/25/2105    Medications:  Scheduled:  . allopurinol  300 mg Oral Daily  .  enoxaparin (LOVENOX) injection  40 mg Subcutaneous Q24H  . multivitamin with minerals  1 tablet Oral Daily  . pantoprazole  40 mg Oral Daily  . piperacillin-tazobactam (ZOSYN)  IV  3.375 g Intravenous Q8H  . senna-docusate  2 tablet Oral QHS  . simvastatin  20 mg Oral Daily  . sodium chloride  1,000 mL Intravenous Once  . vancomycin  1,250 mg Intravenous Q12H  . vancomycin  1,000 mg Intravenous Once   Infusions:  . sodium chloride 75 mL/hr at 08/21/15 0219   Assessment:  69 yr male with NonHodgkin's Lymphoma, undergoing chemotherapy, presents with reported fever and shortness of breath  Chest Xray suspicious of infection  Received Vancomycin 1gm IV x 1 @ 01:27 and Zosyn 3.375gm IV x 1 @ 23:49  Blood and urine cultures ordered  CrCl (n) ~ 79 ml/min  Pharmacy consulted to dose Vancomycin and Zosyn for HCAP  Goal of Therapy:  Vancomycin trough level 15-20 mcg/ml  Plan:  Measure antibiotic drug levels at steady state Follow up culture results   Zosyn 3.375gm IV q8h (each dose infused over 4 hrs)  Vancomycin 1250mg  IV q12h  [employer]y Rothschild, Toribio Harbour, PharmD 08/21/2015,2:22 AM

## 2015-08-21 NOTE — Care Management Note (Signed)
Case Management Note  Patient Details  Name: Ethan Stewart MRN: [id number] Date of Birth: 06-06-1946  Subjective/Objective:                 resp failure and pna   Action/Plan:Date:  August 21, 2015 U.R. performed for needs and level of care. Will continue to follow for Case Management needs.  Velva Harman, RN, BSN, [place]   [phone]  Expected Discharge Date:                  Expected Discharge Plan:  Home/Self Care  In-House Referral:  NA  Discharge planning Services  CM Consult  Post Acute Care Choice:  NA Choice offered to:  NA  DME Arranged:    DME Agency:     HH Arranged:    [place] Agency:     Status of Service:  Completed, signed off  Medicare Important Message Given:    Date Medicare IM Given:    Medicare IM give by:    Date Additional Medicare IM Given:    Additional Medicare Important Message give by:     If discussed at [place] of Stay Meetings, dates discussed:    Additional Comments:  Leeroy Cha, RN 08/21/2015, 3:22 PM

## 2015-08-21 NOTE — Consult Note (Signed)
PULMONARY / CRITICAL CARE MEDICINE   Name: Ethan Stewart MRN: [id number] DOB: [birth year]    ADMISSION DATE:  08/20/2015 CONSULTATION DATE:  08/21/2015  REFERRING MD : Wynelle Cleveland  CHIEF COMPLAINT: SOB  INITIAL PRESENTATION: 69 y.o. M with diffuse large B cell lymphoma, brought to [facility] ED 8/29 with HCAP. Developed worsening hypoxia and respiratory distress later that evening; therefore, PCCM called for further recs..    STUDIES:  CXR 8/29 >>> worsened central lung alveolar opacities bilaterally, suspicious for infectious infiltrates. CTA chest 8/29 >>> b/l airspace disease c/w atypical PNA or non-cardiogenic edema. No PE.  SIGNIFICANT EVENTS: 8/29 - admitted   HISTORY OF PRESENT ILLNESS:  Ethan Stewart is a 69 y.o. M with PMH as outlined below including recently diagnosed (August 2016) diffuse large B cell lymphoma stage IV with inguinal and mesenteric lymphadenopathy plus extranodal involvement of C5 vertebra s/p C5 corpectomy (07/26/15). He has had 1 cycle of R-CHOP with intrathecal methotrexate plus hydrocortisone for CNS prophylaxis (08/09/15). He was seen in oncology clinic 8/26 and was neutropenic for which he was placed on GCSF as well as neutropenic precautions.  He presented to the ED 8/29 with fevers, chills, cough, SOB x 2 days. He was found to have HCAP and was subsequently admitted for further management. He had been seen in ED 1 day prior on 8/27 with similar symptoms but was discharged home.  Later that evening, pt developed worsening SOB to the point it caused him respiratory distress. He was hypoxic despite NRB; therefore, was placed on BiPAP. PCCM was consulted for concerns of further decline / worsening respiratory failure.  Of note, he has residual right arm weakness from cord compression due to C5 tumor. He is under physical therapy treatments.  PAST MEDICAL HISTORY :   has a past medical history of History of bleeding ulcers (1990's); Cervical spine tumor  (07/03/2015); Anxiety; GERD (gastroesophageal reflux disease); H. pylori infection; History of blood transfusion; Bone cancer; Arm numbness (08/07/15); and Diffuse large B cell lymphoma.  has past surgical history that includes Hernia repair (Bilateral); Colonoscopy; Eye surgery (Right); and Anterior cervical corpectomy (N/A, 07/26/2015). Prior to Admission medications   Medication Sig Start Date End Date Taking? Authorizing Provider  acetaminophen (TYLENOL) 500 MG tablet Take 1,000 mg by mouth every 6 (six) hours as needed for mild pain, fever or headache.    Yes Historical Provider, MD  allopurinol (ZYLOPRIM) 300 MG tablet Take 1 tablet (300 mg total) by mouth daily. 08/08/15  Yes Brunetta Genera, MD  diazepam (VALIUM) 5 MG tablet Take 5 mg by mouth every 6 (six) hours as needed for anxiety.    Yes Historical Provider, MD  HYDROcodone-acetaminophen (NORCO/VICODIN) 5-325 MG per tablet Take 1 tablet by mouth every 4 (four) hours as needed (mild pain). 07/27/15  Yes Eustace Moore, MD  levofloxacin (LEVAQUIN) 750 MG tablet Take 1 tablet (750 mg total) by mouth daily. IF fever develops while neutropenic. Call doctor immediately. 08/15/15  Yes Brunetta Genera, MD  LORazepam (ATIVAN) 0.5 MG tablet Take 1 tablet (0.5 mg total) by mouth every 6 (six) hours as needed (Nausea or vomiting). Patient taking differently: Take 0.5 mg by mouth at bedtime.  08/08/15  Yes Brunetta Genera, MD  Multiple Vitamin (MULTIVITAMIN WITH MINERALS) TABS tablet Take 1 tablet by mouth daily.   Yes Historical Provider, MD  neomycin-bacitracin-polymyxin (NEOSPORIN) ointment Apply 1 application topically as needed for wound care (left foot blister). apply to eye   Yes Historical Provider,  MD  omeprazole (PRILOSEC) 40 MG capsule Take 1 capsule (40 mg total) by mouth daily. 07/24/15  Yes Brunetta Genera, MD  senna-docusate (SENNA S) 8.6-50 MG per tablet Take 2 tablets by mouth at bedtime. 08/07/15  Yes Brunetta Genera, MD   simvastatin (ZOCOR) 20 MG tablet Take 20 mg by mouth daily.   Yes Historical Provider, MD  lidocaine-prilocaine (EMLA) cream Apply to affected area once 08/08/15   Brunetta Genera, MD  ondansetron (ZOFRAN) 8 MG tablet Take 1 tablet (8 mg total) by mouth 2 (two) times daily. Start the day after chemo for 3 days. Then as needed for nausea or vomiting. 08/08/15   Brunetta Genera, MD  predniSONE (DELTASONE) 20 MG tablet Take 4.5 tablets (90 mg total) by mouth daily. Take on days 1-5 of chemotherapy. Patient not taking: Reported on 08/20/2015 08/08/15   Brunetta Genera, MD  Sarita    Historical Provider, MD  prochlorperazine (COMPAZINE) 10 MG tablet Take 1 tablet (10 mg total) by mouth every 6 (six) hours as needed (Nausea or vomiting). 08/08/15   Brunetta Genera, MD   No Known Allergies  FAMILY HISTORY:  Family History  Problem Relation Age of Onset  . Hypertension Mother     SOCIAL HISTORY:  reports that he quit smoking about 33 years ago. His smoking use included Cigarettes. He has a 10 pack-year smoking history. He has never used smokeless tobacco. He reports that he drinks about 3.0 oz of alcohol per week. He reports that he does not use illicit drugs.  REVIEW OF SYSTEMS:   Bolds are positive  Constitutional: weight loss, gain, night sweats, Fevers, chills, fatigue .  HEENT: headaches, Sore throat, sneezing, nasal congestion, post nasal drip, Difficulty swallowing, Tooth/dental problems, visual complaints visual changes, ear ache CV:  chest pain, radiates: ,Orthopnea, PND, swelling in lower extremities, dizziness, palpitations, syncope.  GI  heartburn, indigestion, abdominal pain, nausea, vomiting, diarrhea, change in bowel habits, loss of appetite, bloody stools.  Resp: cough, productive: , hemoptysis, dyspnea, chest pain, pleuritic.  Skin: rash or itching or icterus GU: dysuria, change in color of urine, urgency or frequency. flank pain, hematuria   MS: joint pain or swelling. decreased range of motion  Psych: change in mood or affect. depression or anxiety.  Neuro: difficulty with speech, weakness, numbness, ataxia   SUBJECTIVE:   VITAL SIGNS: Temp:  [98.1 F (36.7 C)-103.3 F (39.6 C)] 103.3 F (39.6 C) (08/29 1608) Pulse Rate:  [95-114] 95 (08/29 1800) Resp:  [18-48] 35 (08/29 1800) BP: (139-166)/(62-79) 154/65 mmHg (08/29 1800) SpO2:  [87 %-100 %] 99 % (08/29 1800) FiO2 (%):  [5 %-100 %] 100 % (08/29 1800) Weight:  [95.5 kg (210 lb 8.6 oz)-97.3 kg (214 lb 8.1 oz)] 97.3 kg (214 lb 8.1 oz) (08/29 1200) HEMODYNAMICS:   VENTILATOR SETTINGS: Vent Mode:  [-]  FiO2 (%):  [5 %-100 %] 100 % Set Rate:  [14 bmp] 14 bmp PEEP:  [5 cmH20] 5 cmH20 INTAKE / OUTPUT: Intake/Output      08/29 0701 - 08/30 0700   P.O.    I.V. (mL/kg) 457.5 (4.7)   IV Piggyback 850   Total Intake(mL/kg) 1307.5 (13.4)   Urine (mL/kg/hr) 800 (0.7)   Total Output 800   Net +507.5         PHYSICAL EXAMINATION: General: Male of normal body habitus in NAD on BiPAP Neuro: Alert, oriented, chronic RUE weakness HEENT: [place]/AT, no JVD, PERRL  Cardiovascular: RRR,  no MRG Lungs: resps even, unlabored, on BiPAP. Coarse breath sounds Abdomen: Soft, non-tender, non-distended Musculoskeletal: No acute deformity Skin: Grossly intact  LABS:  CBC  Recent Labs Lab 08/19/15 1015 08/20/15 2241 08/21/15 0316  WBC 10.2 9.8 9.6  HGB 9.2* 8.9* 8.7*  HCT 27.5* 26.3* 25.5*  PLT 173 242 231   Coag's  Recent Labs Lab 08/21/15 0316  APTT 54*  INR 1.36   BMET  Recent Labs Lab 08/19/15 1015 08/20/15 2241 08/21/15 0316  NA 137 136 137  K 3.7 3.8 3.6  CL 106 107 107  CO2 24 24 24   BUN 15 16 14   CREATININE 0.87 0.89 1.00  GLUCOSE 103* 101* 102*   Electrolytes  Recent Labs Lab 08/15/15 0809 08/19/15 1015 08/20/15 2241 08/21/15 0316  CALCIUM 9.0 8.2* 8.1* 7.9*  MG 2.0  --   --   --   PHOS 2.4  --   --   --    Sepsis Markers  Recent  Labs Lab 08/21/15 0316 08/21/15 0615 08/21/15 1300  LATICACIDVEN 1.6 1.2 1.6  PROCALCITON 1.98  --   --    ABG  Recent Labs Lab 08/21/15 1238  PHART 7.472*  PCO2ART 31.9*  PO2ART 66.6*   Liver Enzymes  Recent Labs Lab 08/15/15 0809 08/19/15 1015 08/20/15 2241  AST 15 48* 67*  ALT 35 35 33  ALKPHOS 77 118 135*  BILITOT 0.67 0.6 0.2*  ALBUMIN 2.8* 2.5* 2.4*   Cardiac Enzymes  Recent Labs Lab 08/21/15 1725  TROPONINI 0.05*   Glucose No results for input(s): GLUCAP in the last 168 hours.  Imaging Dg Chest 2 View  08/20/2015   CLINICAL DATA:  Fever, cough, dyspnea. One week out from first treatment for non-Hodgkin's lymphoma.  EXAM: CHEST  2 VIEW  COMPARISON:  08/19/2015  FINDINGS: There is developing alveolar opacity in the central lung bilaterally, a significant worsening from 08/19/2015. This may represent infectious infiltrate. Alveolar edema or hemorrhage is less likely. There are no pleural effusions. Heart size is unchanged.  IMPRESSION: Worsened central lung alveolar opacities bilaterally, suspicious for infectious infiltrates.   Electronically Signed   By: Andreas Newport M.D.   On: 08/20/2015 23:31   Ct Angio Chest Pe W/cm &/or Wo Cm  08/21/2015   CLINICAL DATA:  Hypoxia, cough, and shortness of breath for 2 days. Pneumonia. Large B-cell lymphoma with ongoing chemotherapy. Productive cough with fever and chills.  EXAM: CT ANGIOGRAPHY CHEST WITH CONTRAST  TECHNIQUE: Multidetector CT imaging of the chest was performed using the standard protocol during bolus administration of intravenous contrast. Multiplanar CT image reconstructions and MIPs were obtained to evaluate the vascular anatomy.  CONTRAST:  127mL OMNIPAQUE IOHEXOL 350 MG/ML SOLN  COMPARISON:  06/21/2015  FINDINGS: THORACIC INLET/BODY WALL:  No acute abnormality.  MEDIASTINUM:  Normal heart size. No pericardial effusion. Suboptimal pulmonary artery opacification due to bolus dispersion and intermittent  motion, but diagnostic and negative for pulmonary embolism. Four vessel aortic arch without aortic dissection or aneurysm.  LUNG WINDOWS:  There is ground-glass patchy lung opacity with apical predominance. The opacities are fairly symmetric. No interlobular septal thickening at the bases or in non opacified portions of the lungs. A subpleural nodule in the right lower lobe on lung series image 54 is stable, appearance favoring incidental lymph node. Mild dependent atelectasis and trace pleural effusions.  No intrathoracic adenopathy.  UPPER ABDOMEN:  Pneumobilia which is chronic.  No indication of abdominal pain.  OSSEOUS:  No acute fracture.  No suspicious lytic or blastic lesions.  Review of the MIP images confirms the above findings.  IMPRESSION: 1. Bilateral airspace disease consistent with atypical pneumonia or noncardiogenic edema. 2. No evidence of pulmonary embolism. 3. Chronic pneumobilia.   Electronically Signed   By: Monte Fantasia M.D.   On: 08/21/2015 10:35     ASSESSMENT / PLAN:  PULMONARY A: Acute hypoxemic respiratory failure. HCAP. ? ARDS. Concern for PCP / PJP given immunocompromised state + recent steroid use. Doubt intrathecal methotrexate related pneumonitis. At risk for intubation. P:  Continue BiPAP. Continue supplemental oxygen as needed to maintain SpO2 > 92%. If declines further, would require intubation. If further specimen sampling required (BAL), would require intubation first. Abx per ID section, appreciate ID assistance. Follow cultures. Continue steroids, bronchodilators. Pulmonary hygiene. CXR in AM.  CARDIOVASCULAR A:  Mild troponin leak - suspect demand ischemia. P:  Trend troponins. EKG.  RENAL A:  Pseudohypocalcemia - corrects to 9.38. P:  NS @ 125. Send ionized calcium. BMP in AM.  GASTROINTESTINAL A:  GERD. Protein calorie malnutrition. Nutrition. P:  Pantoprazole. Once clear for PO's, consult nutrition for calorie  counts / diet education. NPO.  HEMATOLOGIC / ONCOLOGIC A:  Diffuse large B cell lymphoma (follows with Dr. Vivien Rossetti). Anemia. VTE Prophylaxis. P:  Dr. Vivien Rossetti with Heme / Onc following. Transfuse per usual ICU guidelines. SCD's / Lovenox. CBC in AM.  INFECTIOUS A:  HCAP / ? Atypical PNA.  P:  BCx2 8/29 >  UCx 8/29 > Sputum Cx 8/29 > PCP stain 8/29 > U. Legionella 8/29 > U. Strep 8/29 > RVP 8/29 > Abx: Zosyn, start date 8/29, day 1/x. Abx: Azithro, start date 8/29 day 1/x Abx: Bactrim, start date 8/29, day 1/x. PCT algorithm not helpful given his immunocompromised state.  ENDOCRINE A:  At risk for steroid induced hyperglycemia.  P:  SSI if glucose consistently > 180.  NEUROLOGIC A:  Anxiety Hx C5 tumor - s/p C5 corpectomy with C4-C6 arthrodesis and anterior cervical plating of C4-C6 (Dr. Ronnald Ramp - 07/26/15). P:  PRN low dose ativan. D/c valium - avoid oversedation.  Code Status: Full. Feels he is strong, wants to keep fighting.  Family updated: 8/29 patient and wife updated  Interdisciplinary Family Meeting v Palliative Care Meeting: Due by: 9/5   Georgann Housekeeper, AGACNP-BC [place] [facility] Pager [phone] or [phone]  08/21/2015 8:42 PM

## 2015-08-21 NOTE — Progress Notes (Signed)
RN & RT assessed Pt just prior to  CT, due to Sp02 in 80s on 5L [place]. Pt apparently desats when breaths through mouth. Placed Pt on 50% Venti Mask. Sp02 = 91. CT staff aware.

## 2015-08-22 ENCOUNTER — Inpatient Hospital Stay (HOSPITAL_COMMUNITY): Payer: Non-veteran care

## 2015-08-22 DIAGNOSIS — R7982 Elevated C-reactive protein (CRP): Secondary | ICD-10-CM

## 2015-08-22 DIAGNOSIS — J9691 Respiratory failure, unspecified with hypoxia: Secondary | ICD-10-CM

## 2015-08-22 DIAGNOSIS — R700 Elevated erythrocyte sedimentation rate: Secondary | ICD-10-CM

## 2015-08-22 LAB — BLOOD GAS, ARTERIAL
ACID-BASE DEFICIT: 1.9 mmol/L (ref 0.0–2.0)
Bicarbonate: 23 mEq/L (ref 20.0–24.0)
DRAWN BY: 295031
FIO2: 0.8
MECHVT: 480 mL
O2 SAT: 94.1 %
PATIENT TEMPERATURE: 98.6
PCO2 ART: 42.1 mmHg (ref 35.0–45.0)
PEEP/CPAP: 16 cmH2O
PH ART: 7.357 (ref 7.350–7.450)
PO2 ART: 81.7 mmHg (ref 80.0–100.0)
RATE: 28 resp/min
TCO2: 20.7 mmol/L (ref 0–100)

## 2015-08-22 LAB — BASIC METABOLIC PANEL
ANION GAP: 8 (ref 5–15)
BUN: 9 mg/dL (ref 6–20)
CALCIUM: 7.9 mg/dL — AB (ref 8.9–10.3)
CO2: 24 mmol/L (ref 22–32)
CREATININE: 0.74 mg/dL (ref 0.61–1.24)
Chloride: 106 mmol/L (ref 101–111)
Glucose, Bld: 177 mg/dL — ABNORMAL HIGH (ref 65–99)
Potassium: 3.3 mmol/L — ABNORMAL LOW (ref 3.5–5.1)
SODIUM: 138 mmol/L (ref 135–145)

## 2015-08-22 LAB — CBC
HEMATOCRIT: 26 % — AB (ref 39.0–52.0)
Hemoglobin: 8.7 g/dL — ABNORMAL LOW (ref 13.0–17.0)
MCH: 28.6 pg (ref 26.0–34.0)
MCHC: 33.5 g/dL (ref 30.0–36.0)
MCV: 85.5 fL (ref 78.0–100.0)
Platelets: 273 10*3/uL (ref 150–400)
RBC: 3.04 MIL/uL — ABNORMAL LOW (ref 4.22–5.81)
RDW: 17 % — AB (ref 11.5–15.5)
WBC: 10.4 10*3/uL (ref 4.0–10.5)

## 2015-08-22 LAB — URINE CULTURE: Culture: NO GROWTH

## 2015-08-22 LAB — TROPONIN I: TROPONIN I: 0.03 ng/mL (ref <0.031)

## 2015-08-22 LAB — LEGIONELLA ANTIGEN, URINE

## 2015-08-22 LAB — PHOSPHORUS: PHOSPHORUS: 3.3 mg/dL (ref 2.5–4.6)

## 2015-08-22 LAB — MAGNESIUM: MAGNESIUM: 2.1 mg/dL (ref 1.7–2.4)

## 2015-08-22 LAB — TRIGLYCERIDES: Triglycerides: 90 mg/dL (ref <150)

## 2015-08-22 MED ORDER — FENTANYL CITRATE (PF) 100 MCG/2ML IJ SOLN
INTRAMUSCULAR | Status: AC
  Filled 2015-08-22: qty 2

## 2015-08-22 MED ORDER — MIDAZOLAM HCL 2 MG/2ML IJ SOLN
2.0000 mg | Freq: Once | INTRAMUSCULAR | Status: AC
  Administered 2015-08-22: 2 mg via INTRAVENOUS

## 2015-08-22 MED ORDER — ETOMIDATE 2 MG/ML IV SOLN
INTRAVENOUS | Status: AC
  Filled 2015-08-22: qty 20

## 2015-08-22 MED ORDER — ETOMIDATE 2 MG/ML IV SOLN
20.0000 mg | Freq: Once | INTRAVENOUS | Status: AC
  Administered 2015-08-22: 20 mg via INTRAVENOUS
  Filled 2015-08-22: qty 10

## 2015-08-22 MED ORDER — DEXAMETHASONE SODIUM PHOSPHATE 4 MG/ML IJ SOLN
4.0000 mg | Freq: Four times a day (QID) | INTRAMUSCULAR | Status: DC
  Administered 2015-08-22 – 2015-08-28 (×24): 4 mg via INTRAVENOUS
  Filled 2015-08-22 (×24): qty 1

## 2015-08-22 MED ORDER — PROPOFOL 1000 MG/100ML IV EMUL
0.0000 ug/kg/min | INTRAVENOUS | Status: DC
  Administered 2015-08-22: 5 ug/kg/min via INTRAVENOUS
  Administered 2015-08-23: 20 ug/kg/min via INTRAVENOUS
  Administered 2015-08-23: 40 ug/kg/min via INTRAVENOUS
  Administered 2015-08-23: 5 ug/kg/min via INTRAVENOUS
  Administered 2015-08-23: 40 ug/kg/min via INTRAVENOUS
  Administered 2015-08-24: 25 ug/kg/min via INTRAVENOUS
  Administered 2015-08-24: 15 ug/kg/min via INTRAVENOUS
  Administered 2015-08-24 (×2): 40 ug/kg/min via INTRAVENOUS
  Administered 2015-08-25 – 2015-08-26 (×4): 20 ug/kg/min via INTRAVENOUS
  Administered 2015-08-26: 15 ug/kg/min via INTRAVENOUS
  Administered 2015-08-27 – 2015-08-28 (×4): 20 ug/kg/min via INTRAVENOUS
  Administered 2015-08-28: 10 ug/kg/min via INTRAVENOUS
  Administered 2015-08-28 – 2015-08-29 (×2): 20 ug/kg/min via INTRAVENOUS
  Filled 2015-08-22 (×24): qty 100

## 2015-08-22 MED ORDER — FENTANYL BOLUS VIA INFUSION
25.0000 ug | INTRAVENOUS | Status: DC | PRN
  Administered 2015-08-22 – 2015-08-28 (×5): 25 ug via INTRAVENOUS
  Filled 2015-08-22: qty 25

## 2015-08-22 MED ORDER — LIDOCAINE HCL (CARDIAC) 20 MG/ML IV SOLN
INTRAVENOUS | Status: AC
  Filled 2015-08-22: qty 5

## 2015-08-22 MED ORDER — ROCURONIUM BROMIDE 50 MG/5ML IV SOLN
INTRAVENOUS | Status: AC
  Filled 2015-08-22: qty 2

## 2015-08-22 MED ORDER — FENTANYL CITRATE (PF) 100 MCG/2ML IJ SOLN
100.0000 ug | Freq: Once | INTRAMUSCULAR | Status: AC
  Administered 2015-08-22: 100 ug via INTRAVENOUS

## 2015-08-22 MED ORDER — ROCURONIUM BROMIDE 50 MG/5ML IV SOLN
50.0000 mg | Freq: Once | INTRAVENOUS | Status: AC
  Administered 2015-08-22: 50 mg via INTRAVENOUS
  Filled 2015-08-22: qty 5

## 2015-08-22 MED ORDER — SIMVASTATIN 10 MG PO TABS
20.0000 mg | ORAL_TABLET | Freq: Every day | ORAL | Status: DC
  Administered 2015-08-23: 20 mg
  Filled 2015-08-22: qty 2

## 2015-08-22 MED ORDER — POTASSIUM CHLORIDE 10 MEQ/100ML IV SOLN
10.0000 meq | INTRAVENOUS | Status: AC
  Administered 2015-08-22 (×3): 10 meq via INTRAVENOUS
  Filled 2015-08-22 (×3): qty 100

## 2015-08-22 MED ORDER — MIDAZOLAM HCL 2 MG/2ML IJ SOLN
INTRAMUSCULAR | Status: AC
  Filled 2015-08-22: qty 6

## 2015-08-22 MED ORDER — ANTISEPTIC ORAL RINSE SOLUTION (CORINZ)
7.0000 mL | Freq: Four times a day (QID) | OROMUCOSAL | Status: DC
  Administered 2015-08-22 – 2015-08-31 (×36): 7 mL via OROMUCOSAL

## 2015-08-22 MED ORDER — CHLORHEXIDINE GLUCONATE 0.12% ORAL RINSE (MEDLINE KIT)
15.0000 mL | Freq: Two times a day (BID) | OROMUCOSAL | Status: DC
Start: 2015-08-22 — End: 2015-08-31
  Administered 2015-08-22 – 2015-08-31 (×19): 15 mL via OROMUCOSAL

## 2015-08-22 MED ORDER — FENTANYL CITRATE (PF) 100 MCG/2ML IJ SOLN: 50.0000 ug | Freq: Once | INTRAMUSCULAR | Status: DC

## 2015-08-22 MED ORDER — SUCCINYLCHOLINE CHLORIDE 20 MG/ML IJ SOLN
INTRAMUSCULAR | Status: AC
  Filled 2015-08-22: qty 1

## 2015-08-22 MED ORDER — SODIUM CHLORIDE 0.9 % IV SOLN
25.0000 ug/h | INTRAVENOUS | Status: DC
  Administered 2015-08-22: 50 ug/h via INTRAVENOUS
  Administered 2015-08-23: 125 ug/h via INTRAVENOUS
  Administered 2015-08-23: 120 ug/h via INTRAVENOUS
  Administered 2015-08-23: 150 ug/h via INTRAVENOUS
  Administered 2015-08-23 (×2): 300 ug/h via INTRAVENOUS
  Administered 2015-08-24: 250 ug/h via INTRAVENOUS
  Administered 2015-08-24: 300 ug/h via INTRAVENOUS
  Administered 2015-08-24: 250 ug/h via INTRAVENOUS
  Administered 2015-08-25: 350 ug/h via INTRAVENOUS
  Administered 2015-08-25: 250 ug/h via INTRAVENOUS
  Administered 2015-08-26: 300 ug/h via INTRAVENOUS
  Administered 2015-08-26: 325 ug/h via INTRAVENOUS
  Administered 2015-08-27 – 2015-08-28 (×3): 300 ug/h via INTRAVENOUS
  Administered 2015-08-28: 250 ug/h via INTRAVENOUS
  Filled 2015-08-22 (×17): qty 50

## 2015-08-22 MED ORDER — DIAZEPAM 5 MG/ML IJ SOLN: 2.5000 mg | Freq: Four times a day (QID) | INTRAMUSCULAR | Status: DC | PRN

## 2015-08-22 MED ORDER — FENTANYL CITRATE (PF) 100 MCG/2ML IJ SOLN
INTRAMUSCULAR | Status: AC
Start: 2015-08-22 — End: 2015-08-22
  Filled 2015-08-22: qty 4

## 2015-08-22 NOTE — Progress Notes (Signed)
Ethan Stewart   HEMATOLOGY/ONCOLOGY INPATIENT PROGRESS NOTE  Date of Service: 08/22/2015  Inpatient Attending: .Rigoberto Noel, MD   SUBJECTIVE  I met the patient and his wife at bedside this morning. Patient was to the BiPAP machine. X-ray this morning with significant infiltrates. Still need 100% oxygen. Fevers appeared to be improving. No hypotension and not requiring pressors. Appreciate excellent care from the pulmonary and critical care team. He was being evaluated for possible intubation and appears to have subsequently been intubated. Explained the current clinical situation to his wife at bedside and answered her questions. Continues to be on broad-spectrum antibiotics as well as the PJP treatment.   OBJECTIVE:   PHYSICAL EXAMINATION: . Filed Vitals:   08/22/15 1100 08/22/15 1200 08/22/15 1230 08/22/15 1300  BP: 145/82 181/91 166/73 145/77  Pulse: 81 100 86 85  Temp:   97.5 F (36.4 C) 97.7 F (36.5 C)  TempSrc:      Resp: 33 _0 Height:      Weight:      SpO2: 94% 99% 96% 92%   Filed Weights   08/21/15 0203 08/21/15 1200  Weight: 210 lb 8.6 oz (95.5 kg) 214 lb 8.1 oz (97.3 kg)   .Body mass index is 28.31 kg/(m^2).  GENERAL:alert, on BiPAP machine , somewhat tachypneic and mild respiratory distress . SKIN: skin color, texture, turgor are normal, no rashes or significant lesions EYES: normal, conjunctiva are pink and non-injected, sclera clear OROPHARYNX:no exudate, no erythema and lips, buccal mucosa, and tongue normal  NECK: supple, no JVD, thyroid normal size, non-tender, without nodularity LYMPH:  no palpable lymphadenopathy in the cervical, axillary or inguinal LUNGS: clear to auscultation with normal respiratory effort HEART: regular rate & rhythm,  no murmurs and bilateral lower extremity 2+ edema. ABDOMEN: abdomen soft, non-tender, normoactive bowel sounds  Musculoskeletal: no cyanosis of digits and no clubbing  PSYCH: alert & oriented x 3 with fluent  speech NEURO: no focal motor/sensory deficits  MEDICAL HISTORY:  Past Medical History  Diagnosis Date  . History of bleeding ulcers 1990's  . 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 Anxiety   . GERD (gastroesophageal reflux disease)   . H. pylori infection   . History of blood transfusion   . Bone cancer      tumor on C5  . Arm numbness 08/07/15    Limited movement due to tumor on spine  . Diffuse large B cell lymphoma     biposy 07/25/2105   SURGICAL HISTORY: Past Surgical History  Procedure Laterality Date  . Hernia repair Bilateral     with mesh  . Colonoscopy    . Eye surgery Right     catheter  . Anterior cervical corpectomy N/A 07/26/2015    Procedure: Cervical five Corpectomy/Cervical four-six Fusion/Plate ;  Surgeon: Eustace Moore, MD;  Location: [facility];  Service: Neurosurgery;  Laterality: N/A;  C5 Corpectomy/C4-6 Fusion/Plate C4-6    SOCIAL HISTORY: Social History   Social History  . Marital Status: Married    Spouse Name: N/A  . Number of Children: N/A  . Years of Education: N/A   Occupational History  . Not on file.   Social History Main Topics  . Smoking status: Former Smoker -- 1.00 packs/day for 10 years    Types: Cigarettes    Quit date: 12/23/1981  . Smokeless tobacco: Never Used  . Alcohol Use: 3.0 oz/week    2 Glasses of wine, 3 Cans of beer per week  .  Drug Use: No  . Sexual Activity: Yes   Other Topics Concern  . Not on file   Social History Narrative    FAMILY HISTORY: Family History  Problem Relation Age of Onset  . Hypertension Mother     ALLERGIES:  has No Known Allergies.  MEDICATIONS:  Scheduled Meds: . antiseptic oral rinse  7 mL Mouth Rinse QID  . azithromycin  500 mg Oral Daily  . chlorhexidine gluconate  15 mL Mouth Rinse BID  . dexamethasone  4 mg Intravenous 4 times per day  . enoxaparin (LOVENOX) injection  40 mg Subcutaneous Q24H  . fentaNYL (SUBLIMAZE) injection  50 mcg Intravenous Once  . lidocaine (cardiac)  100 mg/27m      . piperacillin-tazobactam (ZOSYN)  IV  3.375 g Intravenous Q8H  . [START ON 08/23/2015] simvastatin  20 mg Per Tube Daily  . sodium chloride  1,000 mL Intravenous Once  . succinylcholine      . sulfamethoxazole-trimethoprim  500 mg Intravenous Q8H   Continuous Infusions: . sodium chloride 50 mL/hr at 08/22/15 1023  . fentaNYL infusion INTRAVENOUS 75 mcg/hr (08/22/15 1312)  . propofol (DIPRIVAN) infusion 25 mcg/kg/min (08/22/15 1338)   PRN Meds:.acetaminophen **OR** acetaminophen, albuterol, fentaNYL, neomycin-bacitracin-polymyxin, [DISCONTINUED] ondansetron **OR** ondansetron (ZOFRAN) IV  REVIEW OF SYSTEMS:    10 Point review of Systems was done is negative except as noted above.   LABORATORY DATA:  I have reviewed the data as listed  . CBC Latest Ref Rng 08/22/2015 08/21/2015 08/20/2015  WBC 4.0 - 10.5 K/uL 10.4 9.6 9.8  Hemoglobin 13.0 - 17.0 g/dL 8.7(L) 8.7(L) 8.9(L)  Hematocrit 39.0 - 52.0 % 26.0(L) 25.5(L) 26.3(L)  Platelets 150 - 400 K/uL 273 231 242    . CMP Latest Ref Rng 08/22/2015 08/21/2015 08/20/2015  Glucose 65 - 99 mg/dL 177(H) 102(H) 101(H)  BUN 6 - 20 mg/dL _0 Creatinine 0.61 - 1.24 mg/dL 0.74 1.00 0.89  Sodium 135 - 145 mmol/L 138 137 136  Potassium 3.5 - 5.1 mmol/L 3.3(L) 3.6 3.8  Chloride 101 - 111 mmol/L 106 107 107  CO2 22 - 32 mmol/L _1 Calcium 8.9 - 10.3 mg/dL 7.9(L) 7.9(L) 8.1(L)  Total Protein 6.5 - 8.1 g/dL - - 6.0(L)  Total Bilirubin 0.3 - 1.2 mg/dL - - 0.2(L)  Alkaline Phos 38 - 126 U/L - - 135(H)  AST 15 - 41 U/L - - 67(H)  ALT 17 - 63 U/L - - 33     RADIOGRAPHIC STUDIES: I have personally reviewed the radiological images as listed and agreed with the findings in the report. Dg Chest 2 View  08/20/2015   CLINICAL DATA:  Fever, cough, dyspnea. One week out from first treatment for non-Hodgkin's lymphoma.  EXAM: CHEST  2 VIEW  COMPARISON:  08/19/2015  FINDINGS: There is developing alveolar opacity in the central lung  bilaterally, a significant worsening from 08/19/2015. This may represent infectious infiltrate. Alveolar edema or hemorrhage is less likely. There are no pleural effusions. Heart size is unchanged.  IMPRESSION: Worsened central lung alveolar opacities bilaterally, suspicious for infectious infiltrates.   Electronically Signed   By: DAndreas NewportM.D.   On: 08/20/2015 23:31   Dg Chest 2 View  08/19/2015   CLINICAL DATA:  Fever.  History of bone cancer.  EXAM: CHEST  2 VIEW  COMPARISON:  PET-CT - 07/10/2015; chest CT - 06/11/2015  FINDINGS: Grossly unchanged cardiac silhouette and mediastinal contours. There is mild diffuse slightly nodular thickening of  the pulmonary arch ischium, most conspicuous within the peripheral aspects of the bilateral upper/ mid lungs. Minimal bibasilar heterogeneous opacities favored to represent atelectasis or scar. No discrete focal airspace opacities. No pleural effusion or pneumothorax. No no acute osseous abnormalities. Post lower cervical ACDF, incompletely evaluated.  IMPRESSION: Findings suggestive of airways disease / bronchitis. No focal airspace opacities to suggest pneumonia.   Electronically Signed   By: Sandi Mariscal M.D.   On: 08/19/2015 09:24   Dg Cervical Spine 2-3 Views  07/26/2015   CLINICAL DATA:  Cervical spine fusion.  EXAM: DG C-ARM 61-120 MIN; CERVICAL SPINE - 2-3 VIEW  COMPARISON:  MRI 07/20/2015.  FINDINGS: C4 through C6 anterior interbody fusion. Hardware intact. Anatomic alignment. Two images. Fluoroscopic time 0 min 9 seconds.  IMPRESSION: C4 through C6 anterior and interbody fusion.   Electronically Signed   By: Marcello Moores  Register   On: 07/26/2015 15:57   Ct Angio Chest Pe W/cm &/or Wo Cm  08/21/2015   CLINICAL DATA:  Hypoxia, cough, and shortness of breath for 2 days. Pneumonia. Large B-cell lymphoma with ongoing chemotherapy. Productive cough with fever and chills.  EXAM: CT ANGIOGRAPHY CHEST WITH CONTRAST  TECHNIQUE: Multidetector CT imaging of the  chest was performed using the standard protocol during bolus administration of intravenous contrast. Multiplanar CT image reconstructions and MIPs were obtained to evaluate the vascular anatomy.  CONTRAST:  127m OMNIPAQUE IOHEXOL 350 MG/ML SOLN  COMPARISON:  06/21/2015  FINDINGS: THORACIC INLET/BODY WALL:  No acute abnormality.  MEDIASTINUM:  Normal heart size. No pericardial effusion. Suboptimal pulmonary artery opacification due to bolus dispersion and intermittent motion, but diagnostic and negative for pulmonary embolism. Four vessel aortic arch without aortic dissection or aneurysm.  LUNG WINDOWS:  There is ground-glass patchy lung opacity with apical predominance. The opacities are fairly symmetric. No interlobular septal thickening at the bases or in non opacified portions of the lungs. A subpleural nodule in the right lower lobe on lung series image 54 is stable, appearance favoring incidental lymph node. Mild dependent atelectasis and trace pleural effusions.  No intrathoracic adenopathy.  UPPER ABDOMEN:  Pneumobilia which is chronic.  No indication of abdominal pain.  OSSEOUS:  No acute fracture.  No suspicious lytic or blastic lesions.  Review of the MIP images confirms the above findings.  IMPRESSION: 1. Bilateral airspace disease consistent with atypical pneumonia or noncardiogenic edema. 2. No evidence of pulmonary embolism. 3. Chronic pneumobilia.   Electronically Signed   By: JMonte FantasiaM.D.   On: 08/21/2015 10:35   Mr Cervical Spine W Wo Contrast  08/07/2015   CLINICAL DATA:  Previous corpectomy at C5 due to tumor involvement. Unable to raise the right arm because of weakness. Symptoms began 08/05/2015. Personal history of lymphoma.  EXAM: MRI CERVICAL SPINE WITHOUT AND WITH CONTRAST  TECHNIQUE: Multiplanar and multiecho pulse sequences of the cervical spine, to include the craniocervical junction and cervicothoracic junction, were obtained according to standard protocol without and with  intravenous contrast.  CONTRAST:  217mMULTIHANCE GADOBENATE DIMEGLUMINE 529 MG/ML IV SOLN  COMPARISON:  Preoperative study 07/20/2015  FINDINGS: No abnormality is seen above or below the operative region of C4 through C6. No osseous lesion. No stenosis of the canal or foramina.  In the operative region of C4 through C6, the patient has had corpectomy at C5 with anterior plate and screws from C4 through C6 with an intervening strut graft. The spinal canal is widely patent with ample subarachnoid space surrounding the cord. No evidence  of cord insult.  There appears to be right foraminal involvement by tumor at C4-5 and C5-6. This could affect the C5 and C6 nerve roots.  IMPRESSION: Interval corpectomy at C5 with fusion from C4 through C6 with intervening strut graft. Wide patency of the central canal. No abnormal cord signal.  Enhancing tissue in the foraminal regions at C4-5 and C5-6 on the right consistent with tumor. This could affect the C5 and C6 nerve roots.   Electronically Signed   By: Nelson Chimes M.D.   On: 08/07/2015 14:58   Dg Chest Port 1 View  08/22/2015   CLINICAL DATA:  ARDS  EXAM: PORTABLE CHEST - 1 VIEW  COMPARISON:  August 22, 2015 study obtained earlier in the day  FINDINGS: Endotracheal tube tip is 7.3 cm above the carina. No pneumothorax. There is widespread airspace consolidation bilaterally, most pronounced in the mid and upper lung zones, stable. There is also patchy infiltrate in the left base, stable. No new opacity. Heart is upper normal in size with pulmonary vascularity within normal limits. No adenopathy apparent.  IMPRESSION: Endotracheal tube as described without pneumothorax. Areas of interstitial and alveolar opacity bilaterally are stable. The appearance is consistent with ARDS. Superimposed pneumonia is suspected. No change in cardiac silhouette.   Electronically Signed   By: Lowella Grip III M.D.   On: 08/22/2015 12:00   Dg Chest Port 1 View  08/22/2015   CLINICAL  DATA:  ARDS  EXAM: PORTABLE CHEST - 1 VIEW  COMPARISON:  08/20/2015  FINDINGS: There is worsening central airspace opacity, becoming more confluent. There is no pneumothorax. There is no large effusion.  IMPRESSION: Worsening bilateral airspace opacities.   Electronically Signed   By: Andreas Newport M.D.   On: 08/22/2015 06:48   Dg Abd Portable 1v  08/22/2015   CLINICAL DATA:  OG tube placement  EXAM: PORTABLE ABDOMEN - 1 VIEW  COMPARISON:  None.  FINDINGS: Enteric tube head tip is in the transpyloric region, likely distal stomach. Nonobstructive bowel gas pattern. No free air organomegaly.  IMPRESSION: NG tube tip in the transpyloric region, likely distal stomach   Electronically Signed   By: Rolm Baptise M.D.   On: 08/22/2015 14:00   Dg Fluoro Guide Spinal/si Jt Inj Left  08/09/2015   CLINICAL DATA:  69 year old male with newly diagnosed diffuse large B-cell lymphoma.  EXAM: DIAGNOSTIC LUMBAR PUNCTURE UNDER FLUOROSCOPIC GUIDANCE  FLUOROSCOPY TIME:  54 seconds  PROCEDURE: Informed consent was obtained from the patient prior to the procedure, including potential complications of headache, allergy, and pain. With the patient prone, the lower back was prepped with Betadine. 1% Lidocaine was used for local anesthesia. Lumbar puncture was performed at the L2-L3 level using a 20 gauge needle with return of clearCSF with an opening pressure of 17 cm water. 6 ml of CSF were obtained for laboratory studies.  Subsequently, 12 mg of methotrexate (total volume of 10 mL) was slowly infused intrathecally. The needle was withdrawn from the patient, and a small bandage was placed over the entrance wound. The patient tolerated the procedure well and there were no apparent complications.  IMPRESSION: 1. Successful diagnostic and therapeutic lumbar puncture at L2-L3, foreign intrathecal methotrexate administration, as detailed above.   Electronically Signed   By: Vinnie Langton M.D.   On: 08/09/2015 17:01   Dg Fluoro  Guide Spinal/si Jt Inj Left  08/09/2015   Etheleen Mayhew, MD     08/09/2015  1:26 PM Lumbar puncture performed at  L2-L3.  Opening pressure 17 cm H20.   6 ml CSF clear CSF collected and sent to laboratory for analysis  per orders of primary team.  12 mg Methorexate in total volume of  10 mL injected slowly.  Needle removed.  No bleeding or other  complications.  See full dictation in PACS.   Dg C-arm 1-60 Min  07/26/2015   CLINICAL DATA:  Cervical spine fusion.  EXAM: DG C-ARM 61-120 MIN; CERVICAL SPINE - 2-3 VIEW  COMPARISON:  MRI 07/20/2015.  FINDINGS: C4 through C6 anterior interbody fusion. Hardware intact. Anatomic alignment. Two images. Fluoroscopic time 0 min 9 seconds.  IMPRESSION: C4 through C6 anterior and interbody fusion.   Electronically Signed   By: Marcello Moores  Register   On: 07/26/2015 15:57    ASSESSMENT & PLAN:   #1  Diffuse large B-cell lymphoma stage IV AE with involvement of mesenteric, inguinal lymph nodes and C5 vertebral body with spinal cord impingement.  He had a C5 corpectomy on 07/26/2015 that showed diffuse large B-cell lymphoma with a Ki-67 ranging from 20 to 90%.    DLBCL (diffuse large B cell lymphoma)   06/21/2015 Imaging CT Chest/abd/pelvis: IMPRESSION: 5 mm nodule within the right lower lobe as described.  Pneumobilia consistent with a prior sphincterotomy.  No acute abnormality is identified. No findings to suggest chest etiology for the known lesion at C5 are seen.   06/21/2015 Imaging MRI c spine1. Diffuse marrow replacement of the C5 vertebral body highly concerning for neoplasm (metastatic disease, myeloma, or lymphoma). Epidural tumor at this level results in moderate spinal stenosis with moderate impression on the spinal cord    07/10/2015 PET scan 1. The previously demonstrated abnormal C5 vertebral body is hypermetabolic. No other osseous lesions demonstrated. 2. There are small hypermetabolic lymph nodes within the left mesentery and both inguinal regions.     07/26/2015 Initial Biopsy Had a C5 corpectomy pathology showed diffuse large B-cell lymphoma   08/08/2015 -  Chemotherapy Started for cycle of R CHOP.    08/09/2015 -  Chemotherapy Received first cycle of intrathecal methotrexate plus hydrocortisone for CNS prophylaxis.   #2 ARDS with fevers and bilateral groundglass pulmonary opacities. Noncardiogenic pulmonary edema normal BNP. Mild troponin leak due to demand ischemia which normalizes. Patient was briefly neutropenic but has responded very well to Neulasta. Somewhat elevated pro-calcitonin levels did not allow for ruling out sepsis. Prolonged steroid use per neurosurgery would be a risk factor for PCP/PJP infection that could flare in the setting of chemotherapy and with resolution of neutropenia with Neulasta. Neulasta itself would increase the risk of ARDS like picture/pneumonitis in the setting of a lung infection/lung injury from chemotherapy and this typically presents with recovering WBC counts as with Mr. Borowiak. Blood cultures negative thus far Viral respiratory panel pending results Elevated sedimentation rate and CRP suggest significant systemic inflammatory response.  Urine testing negative for Legionella antigen and Streptococcus pneumoniae.  #3 hypoxic respiratory failure due to #2 Plan -cont Broad-spectrum antibiotic coverage + empiric PCP/PJP coverage with Bactrim. -Would need pseudomonal dosing on Zosyn.  -We will have to avoid using any G-CSF in the future. -Appreciated care by the pulmonary and critical care team -would increase dexamethasone to 4 mg IV every 6 h from a G-CSF related pneumonitis standpoint. --Continue close monitoring. -consider bronchoscopy for microbiologic diagnosis. - IgG levels pending to rule out hypogammaglobulinemia. Would consider IVIG if levels low. -We'll check CMV antibodies and quantitative PCR.  Discussed current status with patient's wife at bedside.  I will continue to follow this patient  daily. Please call if any questions arise in the interim. Appreciate excellent critical care/ pulmonary care, hospitalist and ID input.   The total time spent  was 25 minutes and more than 50% was on counseling and direct patient cares.    Sullivan Lone MD [place] AAHIVMS [facility] Hematology/Oncology Physician [place]  (Office):       [phone] (Work cell):  [phone] (Fax):           [phone]  08/22/2015 2:18 PM

## 2015-08-22 NOTE — Progress Notes (Signed)
    08/22/2015 3:31 PM

## 2015-08-22 NOTE — Procedures (Signed)
Intubation Procedure Note BRICYN LABRADA [id number] 1946-04-11  Procedure: Intubation Indications: Respiratory insufficiency  Procedure Details Consent: Risks of procedure as well as the alternatives and risks of each were explained to the (patient/caregiver).  Consent for procedure obtained. Time Out: Verified patient identification, verified procedure, site/side was marked, verified correct patient position, special equipment/implants available, medications/allergies/relevent history reviewed, required imaging and test results available.  Performed  Maximum sterile technique was used including antiseptics, gloves, hand hygiene and mask.  MAC and 3  (# 3 glide scope)    Evaluation Hemodynamic Status: BP stable throughout; O2 sats: transiently fell during during procedure Patient's Current Condition: stable Complications: No apparent complications Patient did tolerate procedure well. Chest X-ray ordered to verify placement.  CXR: pending.   Altamese Deguire,PETE 08/22/2015

## 2015-08-22 NOTE — Progress Notes (Addendum)
[facility]    Date of Admission:  08/20/2015   Total days of antibiotics 3        Day 3 piptazo        Day 2 bactrim        Day 2 azithro   ID: Ethan Stewart is a 69 y.o. male with DLBCL with extranodal involvement/ C5 s/p R-CHOP and IT-MTX cycle #1  presents with worsneing shortness of breath and fever Principal Problem:   HCAP (healthcare-associated pneumonia) Active Problems:   DLBCL (diffuse large B cell lymphoma)   Sepsis   Anemia associated with chemotherapy   Sinus tachycardia   GERD (gastroesophageal reflux disease)   Blood poisoning   Hypoxia   Acute on chronic respiratory failure with hypoxia   ARDS (adult respiratory distress syndrome)    Subjective: Intubated due to progressive work of breathing. Afebrile, likely due to steroids. BAL fluid sent for culture  Medications:  . antiseptic oral rinse  7 mL Mouth Rinse QID  . azithromycin  500 mg Oral Daily  . chlorhexidine gluconate  15 mL Mouth Rinse BID  . dexamethasone  4 mg Intravenous 4 times per day  . enoxaparin (LOVENOX) injection  40 mg Subcutaneous Q24H  . fentaNYL (SUBLIMAZE) injection  50 mcg Intravenous Once  . lidocaine (cardiac) 100 mg/55ml      . piperacillin-tazobactam (ZOSYN)  IV  3.375 g Intravenous Q8H  . [START ON 08/23/2015] simvastatin  20 mg Per Tube Daily  . sodium chloride  1,000 mL Intravenous Once  . succinylcholine      . sulfamethoxazole-trimethoprim  500 mg Intravenous Q8H    Objective: Vital signs in last 24 hours: Temp:  [97.5 F (36.4 C)-103.3 F (39.6 C)] 97.5 F (36.4 C) (08/30 1500) Pulse Rate:  [72-111] 72 (08/30 1500) Resp:  [22-35] 28 (08/30 1500) BP: (106-181)/(54-91) 106/54 mmHg (08/30 1500) SpO2:  [90 %-100 %] 96 % (08/30 1500) FiO2 (%):  [70 %-100 %] 70 % (08/30 1500)  Physical Exam  Constitutional: He is sedated, intubated He appears well-developed and well-nourished. No distress.  HENT:  Mouth/Throat: Oropharynx is clear and  moist. No oropharyngeal exudate.  Cardiovascular: Normal rate, regular rhythm and normal heart sounds. Exam reveals no gallop and no friction rub.  No murmur heard.  Pulmonary/Chest: Effort normal and breath sounds normal. No respiratory distress. He has no wheezes.  Abdominal: Soft. Bowel sounds are decreased He exhibits no distension. There is no tenderness.  Skin: Skin is warm and dry. No rash noted. No erythema.    Lab Results  Recent Labs  08/21/15 0316 08/22/15 0320  WBC 9.6 10.4  HGB 8.7* 8.7*  HCT 25.5* 26.0*  NA 137 138  K 3.6 3.3*  CL 107 106  CO2 24 24  BUN 14 9  CREATININE 1.00 0.74   Liver Panel  Recent Labs  08/20/15 2241  PROT 6.0*  ALBUMIN 2.4*  AST 67*  ALT 33  ALKPHOS 135*  BILITOT 0.2*   Sedimentation Rate  Recent Labs  08/21/15 1725  ESRSEDRATE 125*   C-Reactive Protein  Recent Labs  08/21/15 1725  CRP 45.5*    Microbiology: BAL - aerobic culture, pcp dfa stain, fungal cultre pending Blood cx x 2 pending Urine cx x 1 pending Blood cx 8/29 x 1 pending  RVP pending CMV pending Studies/Results: Dg Chest 2 View  08/20/2015   CLINICAL DATA:  Fever, cough, dyspnea. One week out from first treatment for non-Hodgkin's  lymphoma.  EXAM: CHEST  2 VIEW  COMPARISON:  08/19/2015  FINDINGS: There is developing alveolar opacity in the central lung bilaterally, a significant worsening from 08/19/2015. This may represent infectious infiltrate. Alveolar edema or hemorrhage is less likely. There are no pleural effusions. Heart size is unchanged.  IMPRESSION: Worsened central lung alveolar opacities bilaterally, suspicious for infectious infiltrates.   Electronically Signed   By: Andreas Newport M.D.   On: 08/20/2015 23:31   Ct Angio Chest Pe W/cm &/or Wo Cm  08/21/2015   CLINICAL DATA:  Hypoxia, cough, and shortness of breath for 2 days. Pneumonia. Large B-cell lymphoma with ongoing chemotherapy. Productive cough with fever and chills.  EXAM: CT  ANGIOGRAPHY CHEST WITH CONTRAST  TECHNIQUE: Multidetector CT imaging of the chest was performed using the standard protocol during bolus administration of intravenous contrast. Multiplanar CT image reconstructions and MIPs were obtained to evaluate the vascular anatomy.  CONTRAST:  150mL OMNIPAQUE IOHEXOL 350 MG/ML SOLN  COMPARISON:  06/21/2015  FINDINGS: THORACIC INLET/BODY WALL:  No acute abnormality.  MEDIASTINUM:  Normal heart size. No pericardial effusion. Suboptimal pulmonary artery opacification due to bolus dispersion and intermittent motion, but diagnostic and negative for pulmonary embolism. Four vessel aortic arch without aortic dissection or aneurysm.  LUNG WINDOWS:  There is ground-glass patchy lung opacity with apical predominance. The opacities are fairly symmetric. No interlobular septal thickening at the bases or in non opacified portions of the lungs. A subpleural nodule in the right lower lobe on lung series image 54 is stable, appearance favoring incidental lymph node. Mild dependent atelectasis and trace pleural effusions.  No intrathoracic adenopathy.  UPPER ABDOMEN:  Pneumobilia which is chronic.  No indication of abdominal pain.  OSSEOUS:  No acute fracture.  No suspicious lytic or blastic lesions.  Review of the MIP images confirms the above findings.  IMPRESSION: 1. Bilateral airspace disease consistent with atypical pneumonia or noncardiogenic edema. 2. No evidence of pulmonary embolism. 3. Chronic pneumobilia.   Electronically Signed   By: Monte Fantasia M.D.   On: 08/21/2015 10:35   Dg Chest Port 1 View  08/22/2015   CLINICAL DATA:  ARDS  EXAM: PORTABLE CHEST - 1 VIEW  COMPARISON:  August 22, 2015 study obtained earlier in the day  FINDINGS: Endotracheal tube tip is 7.3 cm above the carina. No pneumothorax. There is widespread airspace consolidation bilaterally, most pronounced in the mid and upper lung zones, stable. There is also patchy infiltrate in the left base, stable. No new  opacity. Heart is upper normal in size with pulmonary vascularity within normal limits. No adenopathy apparent.  IMPRESSION: Endotracheal tube as described without pneumothorax. Areas of interstitial and alveolar opacity bilaterally are stable. The appearance is consistent with ARDS. Superimposed pneumonia is suspected. No change in cardiac silhouette.   Electronically Signed   By: Lowella Grip III M.D.   On: 08/22/2015 12:00   Dg Chest Port 1 View  08/22/2015   CLINICAL DATA:  ARDS  EXAM: PORTABLE CHEST - 1 VIEW  COMPARISON:  08/20/2015  FINDINGS: There is worsening central airspace opacity, becoming more confluent. There is no pneumothorax. There is no large effusion.  IMPRESSION: Worsening bilateral airspace opacities.   Electronically Signed   By: Andreas Newport M.D.   On: 08/22/2015 06:48   Dg Abd Portable 1v  08/22/2015   CLINICAL DATA:  OG tube placement  EXAM: PORTABLE ABDOMEN - 1 VIEW  COMPARISON:  None.  FINDINGS: Enteric tube head tip is in the transpyloric  region, likely distal stomach. Nonobstructive bowel gas pattern. No free air organomegaly.  IMPRESSION: NG tube tip in the transpyloric region, likely distal stomach   Electronically Signed   By: Rolm Baptise M.D.   On: 08/22/2015 14:00    Assessment/Plan: HCAP-Pneumonia/pneumonitis = would continue with piptazo plus bactrim. Unclear if infectious vs. GCSF related. Recommend to d/c azithromycin. Agree with increase dexamethasone to 4mg  Q 6hr. Need to follow up with PCP stain or CMV labs return to determine how to narrow regimen. Await for RVP panel to return as well.   Respiratory distress s/p intubation = continues with pulm recs, and ARDS guidelines  Fever = masked by steroids  DLBCL = current between cycles. Continue to monitor counts for now.  Baxter Flattery [facility] for Infectious Diseases Cell: [phone] Pager: [phone]  08/22/2015, 3:41 PM

## 2015-08-22 NOTE — Progress Notes (Signed)
PULMONARY / CRITICAL CARE MEDICINE   Name: Ethan Stewart MRN: [id number] DOB: March 06, 1946    ADMISSION DATE:  08/20/2015 CONSULTATION DATE:  08/22/2015  REFERRING MD : Wynelle Cleveland  CHIEF COMPLAINT: SOB  INITIAL PRESENTATION: 69 y.o. M with diffuse large B cell lymphoma, brought to [facility] ED 8/29 with HCAP. Developed worsening hypoxia and respiratory distress later that evening; therefore, PCCM called for further recs..    STUDIES:  CXR 8/29 >>> worsened central lung alveolar opacities bilaterally, suspicious for infectious infiltrates. CTA chest 8/29 >>> b/l airspace disease c/w atypical PNA or non-cardiogenic edema. No PE.  SIGNIFICANT EVENTS: 8/29 - admitted   HISTORY OF PRESENT ILLNESS:  Ethan Stewart is a 69 y.o. M with PMH as outlined below including recently diagnosed (August 2016) diffuse large B cell lymphoma stage IV with inguinal and mesenteric lymphadenopathy plus extranodal involvement of C5 vertebra s/p C5 corpectomy (07/26/15). He has had 1 cycle of R-CHOP with intrathecal methotrexate plus hydrocortisone for CNS prophylaxis (08/09/15). He was seen in oncology clinic 8/26 and was neutropenic for which he was placed on GCSF as well as neutropenic precautions.  He presented to the ED 8/29 with fevers, chills, cough, SOB x 2 days. He was found to have HCAP and was subsequently admitted for further management. He had been seen in ED 1 day prior on 8/27 with similar symptoms but was discharged home.  Later that evening, pt developed worsening SOB to the point it caused him respiratory distress. He was hypoxic despite NRB; therefore, was placed on BiPAP. PCCM was consulted for concerns of further decline / worsening respiratory failure.  Of note, he has residual right arm weakness from cord compression due to C5 tumor. He is under physical therapy treatments.   SUBJECTIVE: denies CP C/o dyspnea - taken off Bipap with visible distress RR 40s & desatn Afebrile Good UO with  lasix   VITAL SIGNS: Temp:  [97.9 F (36.6 C)-103.3 F (39.6 C)] 97.9 F (36.6 C) (08/30 0800) Pulse Rate:  [74-111] 76 (08/30 0900) Resp:  [22-46] 30 (08/30 0900) BP: (139-166)/(55-86) 143/55 mmHg (08/30 0900) SpO2:  [87 %-100 %] 97 % (08/30 0900) FiO2 (%):  [50 %-100 %] 100 % (08/30 0900) Weight:  [214 lb 8.1 oz (97.3 kg)] 214 lb 8.1 oz (97.3 kg) (08/29 1200) HEMODYNAMICS:   VENTILATOR SETTINGS: Vent Mode:  [-] BIPAP FiO2 (%):  [50 %-100 %] 100 % Set Rate:  [14 bmp] 14 bmp PEEP:  [5 cmH20] 5 cmH20 INTAKE / OUTPUT: Intake/Output      08/29 0701 - 08/30 0700 08/30 0701 - 08/31 0700   P.O.     I.V. (mL/kg) 1957.5 (20.1) 250 (2.6)   IV Piggyback 1418.8    Total Intake(mL/kg) 3376.3 (34.7) 250 (2.6)   Urine (mL/kg/hr) 2075 (0.9) 300 (1.2)   Total Output 2075 300   Net +1301.3 -50          PHYSICAL EXAMINATION: General: Male of normal body habitus in NAD on BiPAP Neuro: Alert, oriented, chronic RUE weakness HEENT: [place]/AT, no JVD, PERRL  Cardiovascular: RRR, no MRG Lungs: resps even, unlabored,Coarse breath sounds Abdomen: Soft, non-tender, non-distended Musculoskeletal: No acute deformity Skin: Grossly intact  LABS:  CBC  Recent Labs Lab 08/20/15 2241 08/21/15 0316 08/22/15 0320  WBC 9.8 9.6 10.4  HGB 8.9* 8.7* 8.7*  HCT 26.3* 25.5* 26.0*  PLT 242 231 273   Coag's  Recent Labs Lab 08/21/15 0316  APTT 54*  INR 1.36   BMET  Recent  Labs Lab 08/20/15 2241 08/21/15 0316 08/22/15 0320  NA 136 137 138  K 3.8 3.6 3.3*  CL 107 107 106  CO2 24 24 24   BUN 16 14 9   CREATININE 0.89 1.00 0.74  GLUCOSE 101* 102* 177*   Electrolytes  Recent Labs Lab 08/20/15 2241 08/21/15 0316 08/22/15 0320  CALCIUM 8.1* 7.9* 7.9*  MG  --   --  2.1  PHOS  --   --  3.3   Sepsis Markers  Recent Labs Lab 08/21/15 0316 08/21/15 0615 08/21/15 1300  LATICACIDVEN 1.6 1.2 1.6  PROCALCITON 1.98  --   --    ABG  Recent Labs Lab 08/21/15 1238  PHART 7.472*   PCO2ART 31.9*  PO2ART 66.6*   Liver Enzymes  Recent Labs Lab 08/19/15 1015 08/20/15 2241  AST 48* 67*  ALT 35 33  ALKPHOS 118 135*  BILITOT 0.6 0.2*  ALBUMIN 2.5* 2.4*   Cardiac Enzymes  Recent Labs Lab 08/21/15 1725 08/21/15 2205 08/22/15 0320  TROPONINI 0.05* 0.03 0.03   Glucose No results for input(s): GLUCAP in the last 168 hours.  Imaging Ct Angio Chest Pe W/cm &/or Wo Cm  08/21/2015   CLINICAL DATA:  Hypoxia, cough, and shortness of breath for 2 days. Pneumonia. Large B-cell lymphoma with ongoing chemotherapy. Productive cough with fever and chills.  EXAM: CT ANGIOGRAPHY CHEST WITH CONTRAST  TECHNIQUE: Multidetector CT imaging of the chest was performed using the standard protocol during bolus administration of intravenous contrast. Multiplanar CT image reconstructions and MIPs were obtained to evaluate the vascular anatomy.  CONTRAST:  142mL OMNIPAQUE IOHEXOL 350 MG/ML SOLN  COMPARISON:  06/21/2015  FINDINGS: THORACIC INLET/BODY WALL:  No acute abnormality.  MEDIASTINUM:  Normal heart size. No pericardial effusion. Suboptimal pulmonary artery opacification due to bolus dispersion and intermittent motion, but diagnostic and negative for pulmonary embolism. Four vessel aortic arch without aortic dissection or aneurysm.  LUNG WINDOWS:  There is ground-glass patchy lung opacity with apical predominance. The opacities are fairly symmetric. No interlobular septal thickening at the bases or in non opacified portions of the lungs. A subpleural nodule in the right lower lobe on lung series image 54 is stable, appearance favoring incidental lymph node. Mild dependent atelectasis and trace pleural effusions.  No intrathoracic adenopathy.  UPPER ABDOMEN:  Pneumobilia which is chronic.  No indication of abdominal pain.  OSSEOUS:  No acute fracture.  No suspicious lytic or blastic lesions.  Review of the MIP images confirms the above findings.  IMPRESSION: 1. Bilateral airspace disease  consistent with atypical pneumonia or noncardiogenic edema. 2. No evidence of pulmonary embolism. 3. Chronic pneumobilia.   Electronically Signed   By: Monte Fantasia M.D.   On: 08/21/2015 10:35   Dg Chest Port 1 View  08/22/2015   CLINICAL DATA:  ARDS  EXAM: PORTABLE CHEST - 1 VIEW  COMPARISON:  08/20/2015  FINDINGS: There is worsening central airspace opacity, becoming more confluent. There is no pneumothorax. There is no large effusion.  IMPRESSION: Worsening bilateral airspace opacities.   Electronically Signed   By: Andreas Newport M.D.   On: 08/22/2015 06:48     ASSESSMENT / PLAN:  PULMONARY A: Acute hypoxemic respiratory failure. HCAP. DD incl G-CSF related pneumonitis ? ARDS. Concern for PCP / PJP given immunocompromised state + recent steroid use. At risk for intubation. P:  Continue BiPAP. Continue supplemental oxygen as needed to maintain SpO2 > 92%. If declines further, would require intubation. If further specimen sampling required (BAL), would  require intubation first. Follow cultures. Continue steroids, bronchodilators.   CARDIOVASCULAR A:  Mild troponin leak - suspect demand ischemia. P:  follow  RENAL A:  Pseudohypocalcemia -corrects for alb Hypokalemia P:  NS @ 50. FU ionized calcium. Replete lytes as needed  GASTROINTESTINAL A:  GERD. Protein calorie malnutrition. P:  Pantoprazole. Once clear for PO's, consult nutrition for calorie counts / diet education. NPO.  HEMATOLOGIC / ONCOLOGIC A:  Diffuse large B cell lymphoma (follows with Dr. Vivien Rossetti) s/p 1 cycle of R-CHOP Anemia. VTE Prophylaxis. P:  Dr. Vivien Rossetti with Heme / Onc following. Transfuse per usual ICU guidelines. SCD's / Lovenox. CBC in AM.  INFECTIOUS A:  HCAP / ? Atypical PNA.  P:  BCx2 8/29 >  UCx 8/29 > Sputum Cx 8/29 > PCP stain 8/29 > U. Legionella 8/29 > U. Strep 8/29 > RVP 8/29 > Abx: Zosyn, start date 8/29, day 1/x. Abx: Azithro, start  date 8/29 day 1/x Abx: Bactrim, start date 8/29, day 1/x. PCT algorithm not helpful given his immunocompromised state.  ENDOCRINE A:  At risk for steroid induced hyperglycemia.  P:  SSI if glucose consistently > 180.  NEUROLOGIC A:  Anxiety Hx C5 tumor - s/p C5 corpectomy with C4-C6 arthrodesis and anterior cervical plating of C4-C6 (Dr. Ronnald Ramp - 07/26/15). P:  Confused with ativan - Use low dose valium for anxiety (home med)  - avoid oversedation.  Code Status: Full. Feels he is strong, wants to keep fighting.  Family updated: 8/29 patient and wife updated  Interdisciplinary Family Meeting v Palliative Care Meeting: Due by: 9/5   The patient is critically ill with multiple organ systems failure and requires high complexity decision making for assessment and support, frequent evaluation and titration of therapies, application of advanced monitoring technologies and extensive interpretation of multiple databases. Critical Care Time devoted to patient care services described in this note independent of APP time is 35 minutes.    Kara Mead MD. [facility] Flood. [facility] Pager [phone] If no response call [phone]    08/22/2015 9:41 AM

## 2015-08-23 ENCOUNTER — Telehealth: Payer: Self-pay | Admitting: Hematology

## 2015-08-23 ENCOUNTER — Inpatient Hospital Stay (HOSPITAL_COMMUNITY): Payer: Non-veteran care

## 2015-08-23 DIAGNOSIS — D6481 Anemia due to antineoplastic chemotherapy: Secondary | ICD-10-CM

## 2015-08-23 DIAGNOSIS — J709 Respiratory conditions due to unspecified external agent: Secondary | ICD-10-CM

## 2015-08-23 DIAGNOSIS — D630 Anemia in neoplastic disease: Secondary | ICD-10-CM

## 2015-08-23 LAB — COMPREHENSIVE METABOLIC PANEL
ALBUMIN: 1.7 g/dL — AB (ref 3.5–5.0)
ALK PHOS: 123 U/L (ref 38–126)
ALT: 37 U/L (ref 17–63)
ANION GAP: 7 (ref 5–15)
AST: 84 U/L — ABNORMAL HIGH (ref 15–41)
BUN: 14 mg/dL (ref 6–20)
CHLORIDE: 107 mmol/L (ref 101–111)
CO2: 23 mmol/L (ref 22–32)
Calcium: 7.7 mg/dL — ABNORMAL LOW (ref 8.9–10.3)
Creatinine, Ser: 0.72 mg/dL (ref 0.61–1.24)
GFR calc Af Amer: >60 mL/min (ref >60)
GFR calc non Af Amer: >60 mL/min (ref >60)
GLUCOSE: 154 mg/dL — AB (ref 65–99)
POTASSIUM: 3.5 mmol/L (ref 3.5–5.1)
SODIUM: 137 mmol/L (ref 135–145)
Total Bilirubin: <0.1 mg/dL — ABNORMAL LOW (ref 0.3–1.2)
Total Protein: 4.8 g/dL — ABNORMAL LOW (ref 6.5–8.1)

## 2015-08-23 LAB — RESPIRATORY VIRUS PANEL
Adenovirus: NEGATIVE
Influenza A: NEGATIVE
Influenza B: NEGATIVE
Metapneumovirus: NEGATIVE
PARAINFLUENZA 2 A: NEGATIVE
PARAINFLUENZA 3 A: NEGATIVE
Parainfluenza 1: NEGATIVE
RESPIRATORY SYNCYTIAL VIRUS B: NEGATIVE
RHINOVIRUS: NEGATIVE
Respiratory Syncytial Virus A: NEGATIVE

## 2015-08-23 LAB — BLOOD GAS, ARTERIAL
ACID-BASE DEFICIT: 2.4 mmol/L — AB (ref 0.0–2.0)
BICARBONATE: 21.8 meq/L (ref 20.0–24.0)
Drawn by: 232811
FIO2: 0.8
O2 SAT: 93.7 %
PEEP/CPAP: 14 cmH2O
PH ART: 7.393 (ref 7.350–7.450)
Patient temperature: 36.5
RATE: 28 resp/min
TCO2: 20.7 mmol/L (ref 0–100)
VT: 480 mL
pCO2 arterial: 36.2 mmHg (ref 35.0–45.0)
pO2, Arterial: 74.1 mmHg — ABNORMAL LOW (ref 80.0–100.0)

## 2015-08-23 LAB — PHOSPHORUS: Phosphorus: 3.8 mg/dL (ref 2.5–4.6)

## 2015-08-23 LAB — CBC
HCT: 22.8 % — ABNORMAL LOW (ref 39.0–52.0)
HEMOGLOBIN: 7.8 g/dL — AB (ref 13.0–17.0)
MCH: 29.1 pg (ref 26.0–34.0)
MCHC: 34.2 g/dL (ref 30.0–36.0)
MCV: 85.1 fL (ref 78.0–100.0)
Platelets: 330 10*3/uL (ref 150–400)
RBC: 2.68 MIL/uL — ABNORMAL LOW (ref 4.22–5.81)
RDW: 17.5 % — AB (ref 11.5–15.5)
WBC: 12.2 10*3/uL — ABNORMAL HIGH (ref 4.0–10.5)

## 2015-08-23 LAB — PNEUMOCYSTIS JIROVECI SMEAR BY DFA: PNEUMOCYSTIS JIROVECI AG: NEGATIVE

## 2015-08-23 LAB — C-REACTIVE PROTEIN: CRP: 19.3 mg/dL — ABNORMAL HIGH (ref <1.0)

## 2015-08-23 LAB — GLUCOSE, CAPILLARY: Glucose-Capillary: 127 mg/dL — ABNORMAL HIGH (ref 65–99)

## 2015-08-23 LAB — CALCIUM, IONIZED: Calcium, Ionized, Serum: 4.7 mg/dL (ref 4.5–5.6)

## 2015-08-23 LAB — SEDIMENTATION RATE: Sed Rate: 100 mm/hr — ABNORMAL HIGH (ref 0–16)

## 2015-08-23 LAB — MAGNESIUM: Magnesium: 2.3 mg/dL (ref 1.7–2.4)

## 2015-08-23 LAB — IGG: IgG (Immunoglobin G), Serum: 392 mg/dL — ABNORMAL LOW (ref 700–1600)

## 2015-08-23 LAB — CMV ANTIBODY, IGG (EIA): CMV Ab - IgG: 8.5 U/mL — ABNORMAL HIGH (ref 0.00–0.59)

## 2015-08-23 LAB — CMV IGM

## 2015-08-23 MED ORDER — SODIUM CHLORIDE 0.9 % IJ SOLN
10.0000 mL | INTRAMUSCULAR | Status: DC | PRN
  Administered 2015-09-02 – 2015-09-03 (×5): 10 mL
  Administered 2015-09-05: 20 mL
  Filled 2015-08-23 (×6): qty 40

## 2015-08-23 MED ORDER — NOREPINEPHRINE BITARTRATE 1 MG/ML IV SOLN
2.0000 ug/min | INTRAVENOUS | Status: DC
  Administered 2015-08-23: 5 ug/min via INTRAVENOUS
  Administered 2015-08-24: 4 ug/min via INTRAVENOUS
  Filled 2015-08-23 (×2): qty 4

## 2015-08-23 MED ORDER — SODIUM CHLORIDE 0.9 % IJ SOLN
10.0000 mL | Freq: Two times a day (BID) | INTRAMUSCULAR | Status: DC
  Administered 2015-08-23 – 2015-08-31 (×14): 10 mL
  Administered 2015-09-01: 20 mL
  Administered 2015-09-01 – 2015-09-03 (×3): 10 mL

## 2015-08-23 MED ORDER — OXEPA PO LIQD
1000.0000 mL | ORAL | Status: DC
  Administered 2015-08-23: 1000 mL
  Filled 2015-08-23 (×2): qty 1000

## 2015-08-23 MED ORDER — SODIUM CHLORIDE 0.9 % IV BOLUS (SEPSIS)
1000.0000 mL | Freq: Once | INTRAVENOUS | Status: AC
  Administered 2015-08-23: 1000 mL via INTRAVENOUS

## 2015-08-23 NOTE — Progress Notes (Addendum)
[facility] for Infectious Disease    Date of Admission:  08/20/2015   Total days of antibiotics 4        Day 4 piptazo        Day 4 bactrim        ( azithro x 2 days -d /c'd)   ID: Ethan Stewart is a 69 y.o. male with DLBCL with extranodal involvement/ C5 s/p R-CHOP and IT-MTX cycle #1  presents with worsneing shortness of breath and fever with quick respiratory decompensation now intubated Principal Problem:   HCAP (healthcare-associated pneumonia) Active Problems:   DLBCL (diffuse large B cell lymphoma)   Sepsis   Anemia associated with chemotherapy   Sinus tachycardia   GERD (gastroesophageal reflux disease)   Blood poisoning   Hypoxia   Acute on chronic respiratory failure with hypoxia   ARDS (adult respiratory distress syndrome)    Subjective: Remains afebrile, but episodes of hypothermia. FiO2 up to 90%, with lvasopressors  Medications:  . antiseptic oral rinse  7 mL Mouth Rinse QID  . chlorhexidine gluconate  15 mL Mouth Rinse BID  . dexamethasone  4 mg Intravenous 4 times per day  . enoxaparin (LOVENOX) injection  40 mg Subcutaneous Q24H  . feeding supplement (OXEPA)  1,000 mL Per Tube Q24H  . fentaNYL (SUBLIMAZE) injection  50 mcg Intravenous Once  . piperacillin-tazobactam (ZOSYN)  IV  3.375 g Intravenous Q8H  . sodium chloride  1,000 mL Intravenous Once  . sodium chloride  10-40 mL Intracatheter Q12H    Objective: Vital signs in last 24 hours: Temp:  [96.4 F (35.8 C)-97.7 F (36.5 C)] 96.6 F (35.9 C) (08/31 1400) Pulse Rate:  [58-83] 58 (08/31 0700) Resp:  [11-30] 14 (08/31 1400) BP: (77-115)/(43-67) 86/43 mmHg (08/31 1200) SpO2:  [86 %-98 %] 90 % (08/31 1400) FiO2 (%):  [60 %-100 %] 90 % (08/31 1437) Weight:  [214 lb 8.1 oz (97.3 kg)] 214 lb 8.1 oz (97.3 kg) (08/31 1400)  Physical Exam  Constitutional: He is sedated, intubated He appears well-developed and well-nourished. No distress.  HENT:  Mouth/Throat: Oropharynx is clear and moist.  No oropharyngeal exudate.  Cardiovascular: Normal rate, regular rhythm and normal heart sounds. Exam reveals no gallop and no friction rub.  No murmur heard.  Pulmonary/Chest: Effort normal and breath sounds normal. Mild rhonchi No respiratory distress. He has no wheezes.  Abdominal: Soft. Bowel sounds are decreased He exhibits no distension. There is no tenderness.  Skin: Skin is warm and dry. No rash noted. No erythema.    Lab Results  Recent Labs  08/22/15 0320 08/23/15 0350  WBC 10.4 12.2*  HGB 8.7* 7.8*  HCT 26.0* 22.8*  NA 138 137  K 3.3* 3.5  CL 106 107  CO2 24 23  BUN 9 14  CREATININE 0.74 0.72   Liver Panel  Recent Labs  08/20/15 2241 08/23/15 0350  PROT 6.0* 4.8*  ALBUMIN 2.4* 1.7*  AST 67* 84*  ALT 33 37  ALKPHOS 135* 123  BILITOT 0.2* <0.1*   Sedimentation Rate  Recent Labs  08/23/15 1152  ESRSEDRATE 100*   C-Reactive Protein  Recent Labs  08/21/15 1725 08/23/15 1152  CRP 45.5* 19.3*    Microbiology: BAL - aerobic culture,fungal cultre pending Blood cx x 2 pending Urine cx x 1 pending Blood cx 8/29 x 1 pending  RVP negative PCP stain is negative CMV serum VL pending CMV Ig G +/IgM -  Studies/Results: Dg Chest [employer]  08/23/2015   CLINICAL DATA:  Respiratory failure  EXAM: PORTABLE CHEST - 1 VIEW  COMPARISON:  08/22/2015  FINDINGS: Endotracheal tube is 6 cm above the carina. Nasogastric tube extends into the stomach. Consolidated airspace opacities persist in the central lung regions bilateral, extending into the left base, without significant interval change. No pneumothorax or large effusion evident.  IMPRESSION: Support equipment appears satisfactorily positioned.  No significant interval change in the bilateral airspace opacities.   Electronically Signed   By: Andreas Newport M.D.   On: 08/23/2015 05:27   Dg Chest Port 1 View  08/22/2015   CLINICAL DATA:  ARDS  EXAM: PORTABLE CHEST - 1 VIEW  COMPARISON:  August 22, 2015 study  obtained earlier in the day  FINDINGS: Endotracheal tube tip is 7.3 cm above the carina. No pneumothorax. There is widespread airspace consolidation bilaterally, most pronounced in the mid and upper lung zones, stable. There is also patchy infiltrate in the left base, stable. No new opacity. Heart is upper normal in size with pulmonary vascularity within normal limits. No adenopathy apparent.  IMPRESSION: Endotracheal tube as described without pneumothorax. Areas of interstitial and alveolar opacity bilaterally are stable. The appearance is consistent with ARDS. Superimposed pneumonia is suspected. No change in cardiac silhouette.   Electronically Signed   By: Lowella Grip III M.D.   On: 08/22/2015 12:00   Dg Chest Port 1 View  08/22/2015   CLINICAL DATA:  ARDS  EXAM: PORTABLE CHEST - 1 VIEW  COMPARISON:  08/20/2015  FINDINGS: There is worsening central airspace opacity, becoming more confluent. There is no pneumothorax. There is no large effusion.  IMPRESSION: Worsening bilateral airspace opacities.   Electronically Signed   By: Andreas Newport M.D.   On: 08/22/2015 06:48   Dg Abd Portable 1v  08/22/2015   CLINICAL DATA:  OG tube placement  EXAM: PORTABLE ABDOMEN - 1 VIEW  COMPARISON:  None.  FINDINGS: Enteric tube head tip is in the transpyloric region, likely distal stomach. Nonobstructive bowel gas pattern. No free air organomegaly.  IMPRESSION: NG tube tip in the transpyloric region, likely distal stomach   Electronically Signed   By: Rolm Baptise M.D.   On: 08/22/2015 14:00    Assessment/Plan: HCAP-Pneumonia/pneumonitis = would continue with piptazo plus bactrim. PCP is negative. Recommend to send BAL for cytology, to look for inclusion bodies as possible evidence for CMV pneumonitis.  If CMV PCR is detectable, and high, then CMV pneumonitis would possibly be part of clinical picture. If not improved over the next 2 days, can consider empiric short trial of ganciclovir. Currently tolerating  high dose TMP/SMX  ARDS/Respiratory distress, vent dependent= continue with pulm recs, and ARDS guidelines  Fever = masked by steroids  DLBCL = current between cycles. Continue to monitor counts for now.  Dr. Megan Salon to provide further recs tomorrow  Spent 35 min with patient with greater than 50% counseling family and coordination of care  [facility], [facility] for Infectious Diseases Cell: [phone] Pager: [phone]  08/23/2015, 3:06 PM

## 2015-08-23 NOTE — Progress Notes (Signed)
[name] Kitchen   HEMATOLOGY/ONCOLOGY INPATIENT PROGRESS NOTE  Date of Service: 08/23/2015  Inpatient Attending: .Rigoberto Noel, MD   SUBJECTIVE  Ethan Stewart was seen this afternoon. He remains intubated and on mechanical ventilation. Was able to open his eyes and acknowledge my presence. Fevers have resolved. Hemodynamics appear stable. His sedimentation rate and CRP levels are coming down which is encouraging.  OBJECTIVE:   PHYSICAL EXAMINATION: . Filed Vitals:   08/23/15 1300 08/23/15 1400 08/23/15 1500 08/23/15 1600  BP: 118/63 108/53 113/58 114/54  Pulse:   57   Temp: 96.4 F (35.8 C) 96.6 F (35.9 C) 97 F (36.1 C) 97.3 F (36.3 C)  TempSrc:  Core (Comment) Core (Comment)   Resp: '16 14 14 15  ' Height:      Weight:  214 lb 8.1 oz (97.3 kg)    SpO2: 93% 90% 95% 94%   Filed Weights   08/21/15 0203 08/21/15 1200 08/23/15 1400  Weight: 210 lb 8.6 oz (95.5 kg) 214 lb 8.1 oz (97.3 kg) 214 lb 8.1 oz (97.3 kg)   .Body mass index is 28.31 kg/(m^2).  GENERAL:alert, intubated on a ventilator with light sedation. SKIN: skin color, texture, turgor are normal, no rashes or significant lesions EYES: normal, conjunctiva are pink and non-injected, sclera clear OROPHARYNX:no exudate, no erythema and lips, buccal mucosa, and tongue normal  NECK: supple, no JVD, thyroid normal size, non-tender, without nodularity LYMPH:  no palpable lymphadenopathy in the cervical, axillary or inguinal LUNGS: Continues to have bilateral fine rales. HEART: regular rate & rhythm,  no murmurs and bilateral lower extremity 2+ edema. ABDOMEN: abdomen soft, non-tender, normoactive bowel sounds  Musculoskeletal: no cyanosis of digits and no clubbing  PSYCH: alert & oriented x 3 with fluent speech NEURO: no focal motor/sensory deficits  MEDICAL HISTORY:  Past Medical History  Diagnosis Date  . History of bleeding ulcers 1990's  . 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 Anxiety   . GERD (gastroesophageal reflux disease)    . H. pylori infection   . History of blood transfusion   . Bone cancer      tumor on C5  . Arm numbness 08/07/15    Limited movement due to tumor on spine  . Diffuse large B cell lymphoma     biposy 07/25/2105   SURGICAL HISTORY: Past Surgical History  Procedure Laterality Date  . Hernia repair Bilateral     with mesh  . Colonoscopy    . Eye surgery Right     catheter  . Anterior cervical corpectomy N/A 07/26/2015    Procedure: Cervical five Corpectomy/Cervical four-six Fusion/Plate ;  Surgeon: Eustace Moore, MD;  Location: [facility];  Service: Neurosurgery;  Laterality: N/A;  C5 Corpectomy/C4-6 Fusion/Plate C4-6    SOCIAL HISTORY: Social History   Social History  . Marital Status: Married    Spouse Name: N/A  . Number of Children: N/A  . Years of Education: N/A   Occupational History  . Not on file.   Social History Main Topics  . Smoking status: Former Smoker -- 1.00 packs/day for 10 years    Types: Cigarettes    Quit date: 12/23/1981  . Smokeless tobacco: Never Used  . Alcohol Use: 3.0 oz/week    2 Glasses of wine, 3 Cans of beer per week  . Drug Use: No  . Sexual Activity: Yes   Other Topics Concern  . Not on file   Social History Narrative    FAMILY HISTORY: Family History  Problem  Relation Age of Onset  . Hypertension Mother     ALLERGIES:  has No Known Allergies.  MEDICATIONS:  Scheduled Meds: . antiseptic oral rinse  7 mL Mouth Rinse QID  . chlorhexidine gluconate  15 mL Mouth Rinse BID  . dexamethasone  4 mg Intravenous 4 times per day  . enoxaparin (LOVENOX) injection  40 mg Subcutaneous Q24H  . feeding supplement (OXEPA)  1,000 mL Per Tube Q24H  . fentaNYL (SUBLIMAZE) injection  50 mcg Intravenous Once  . piperacillin-tazobactam (ZOSYN)  IV  3.375 g Intravenous Q8H  . sodium chloride  1,000 mL Intravenous Once  . sodium chloride  10-40 mL Intracatheter Q12H   Continuous Infusions: . sodium chloride 50 mL/hr at 08/22/15 1737  .  fentaNYL infusion INTRAVENOUS 300 mcg/hr (08/23/15 1621)  . norepinephrine (LEVOPHED) Adult infusion 5 mcg/min (08/23/15 1207)  . propofol (DIPRIVAN) infusion 40 mcg/kg/min (08/23/15 1621)   PRN Meds:.acetaminophen **OR** acetaminophen, albuterol, fentaNYL, neomycin-bacitracin-polymyxin, [DISCONTINUED] ondansetron **OR** ondansetron (ZOFRAN) IV, sodium chloride  REVIEW OF SYSTEMS:    10 Point review of Systems was done is negative except as noted above.   LABORATORY DATA:  I have reviewed the data as listed  . CBC Latest Ref Rng 08/23/2015 08/22/2015 08/21/2015  WBC 4.0 - 10.5 K/uL 12.2(H) 10.4 9.6  Hemoglobin 13.0 - 17.0 g/dL 7.8(L) 8.7(L) 8.7(L)  Hematocrit 39.0 - 52.0 % 22.8(L) 26.0(L) 25.5(L)  Platelets 150 - 400 K/uL 330 273 231    . CMP Latest Ref Rng 08/23/2015 08/22/2015 08/21/2015  Glucose 65 - 99 mg/dL 154(H) 177(H) 102(H)  BUN 6 - 20 mg/dL '14 9 14  ' Creatinine 0.61 - 1.24 mg/dL 0.72 0.74 1.00  Sodium 135 - 145 mmol/L 137 138 137  Potassium 3.5 - 5.1 mmol/L 3.5 3.3(L) 3.6  Chloride 101 - 111 mmol/L 107 106 107  CO2 22 - 32 mmol/L '23 24 24  ' Calcium 8.9 - 10.3 mg/dL 7.7(L) 7.9(L) 7.9(L)  Total Protein 6.5 - 8.1 g/dL 4.8(L) - -  Total Bilirubin 0.3 - 1.2 mg/dL <0.1(L) - -  Alkaline Phos 38 - 126 U/L 123 - -  AST 15 - 41 U/L 84(H) - -  ALT 17 - 63 U/L 37 - -   Sedimentation rate 100 down from 125 CRP 19.3 down from 45.5  CMV IgG positive CMV IgM negative CMV DNA by PCR pending   Bronchoalveolar lavage RareWBC present both PMN and mononuclear. No squamous epithelial cells. No organisms seen.  Pneumocystis smear negative  Fungal cultures note yeast or fungal elements noted.         RADIOGRAPHIC STUDIES:ASSESSMENT & PLAN:   #1  Diffuse large B-cell lymphoma stage IV AE with involvement of mesenteric, inguinal lymph nodes and C5 vertebral body with spinal cord impingement.  He had a C5 corpectomy on 07/26/2015 that showed diffuse large B-cell lymphoma with a  Ki-67 ranging from 20 to 90%.    DLBCL (diffuse large B cell lymphoma)   06/21/2015 Imaging CT Chest/abd/pelvis: IMPRESSION: 5 mm nodule within the right lower lobe as described.  Pneumobilia consistent with a prior sphincterotomy.  No acute abnormality is identified. No findings to suggest chest etiology for the known lesion at C5 are seen.   06/21/2015 Imaging MRI c spine1. Diffuse marrow replacement of the C5 vertebral body highly concerning for neoplasm (metastatic disease, myeloma, or lymphoma). Epidural tumor at this level results in moderate spinal stenosis with moderate impression on the spinal cord    07/10/2015 PET scan 1. The previously demonstrated  abnormal C5 vertebral body is hypermetabolic. No other osseous lesions demonstrated. 2. There are small hypermetabolic lymph nodes within the left mesentery and both inguinal regions.    07/26/2015 Initial Biopsy Had a C5 corpectomy pathology showed diffuse large B-cell lymphoma   08/08/2015 -  Chemotherapy Started for cycle of R CHOP.    08/09/2015 -  Chemotherapy Received first cycle of intrathecal methotrexate plus hydrocortisone for CNS prophylaxis.   He is currently between cycles of R CHOP chemotherapy. We might need to dose adjust his chemotherapy once he is better to reduce the risk of neutropenic complications since be might be hard pressed to not use any G-CSF.   #2 ARDS with fevers and bilateral groundglass pulmonary opacities. Noncardiogenic pulmonary edema normal BNP. Mild troponin leak due to demand ischemia which normalized.  Patient was briefly neutropenic but has responded very well to Neulasta. Somewhat elevated pro-calcitonin levels did not allow for ruling out sepsis.  Overall picture appears increasingly more likely related to Neulasta. Neulasta itself would increase the risk of ARDS like picture/pneumonitis in the setting of a lung infection/lung injury from chemotherapy and this typically presents with recovering WBC  counts as with Ethan Stewart. His Neulasta should soon be out of his system typically lasts for 12-14 days after administration on 8/19.  Blood cultures negative thus far Viral respiratory panel negative Bronchoalveolar lavage with no overt organisms seen on Gram stain. PCP negative. CMV IgG positive IgM negative PCR pending. Elevated sedimentation rate and CRP now showing improvement which is a good sign.  Urine testing negative for Legionella antigen and Streptococcus pneumoniae.  #3 hypoxic respiratory failure due to #2  #4 anemia related to chemotherapy and lymphoma as well as possible sepsis. Plan -Continues on IV Zosyn -Now off Bactrim IV given negative PCP on this BAL. - -We will have to avoid using any G-CSF in the future. -Appreciated care by the pulmonary and critical care team -continue dexamethasone to 4 mg IV every 6 h --Continue close monitoring. - IgG levels low and likely due to chemotherapy. In the absence of uncontrolled infection will hold off on IVIG. -Would consider PRBC transfusion with irradiated products to maintain hemoglobin closer to 9 given limited bone marrow reserve and multiple metabolic and cardiopulmonary stressors. -CXR ordered for tomorrow  Appreciate excellent critical care/ pulmonary care, hospitalist and ID input.   The total time spent  was 25 minutes and more than 50% was on counseling and direct patient cares.    Sullivan Lone MD [place] AAHIVMS [facility] Hematology/Oncology Physician [place] [facility]  (Office):       [phone] (Work cell):  [phone] (Fax):           [phone]  08/23/2015 5:09 PM

## 2015-08-23 NOTE — Progress Notes (Signed)
[facility] Progress Note Patient Name: Ethan Stewart DOB: 1946-10-20 MRN: [id number]   Date of Service  08/23/2015  HPI/Events of Note  Low BP today AM with MAP of 55. Recent echo with LVEF 55-60%  eICU Interventions  Bolus 1 lt normal saline.    Intervention Category Major Interventions: Hypotension - evaluation and management  Jahking Lesser 08/23/2015, 5:21 AM

## 2015-08-23 NOTE — Progress Notes (Signed)
PULMONARY / CRITICAL CARE MEDICINE   Name: Ethan Stewart MRN: [id number] DOB: 05-12-1946    ADMISSION DATE:  08/20/2015 CONSULTATION DATE:  08/23/2015  REFERRING MD : Wynelle Cleveland  CHIEF COMPLAINT: SOB  INITIAL PRESENTATION: 69 y.o. M with diffuse large B cell lymphoma, brought to [facility] ED 8/29 with HCAP. Developed worsening hypoxia and respiratory distress later that evening; therefore, PCCM called for further recs..    STUDIES:  CXR 8/29 >>> worsened central lung alveolar opacities bilaterally, suspicious for infectious infiltrates. CTA chest 8/29 >>> b/l airspace disease c/w atypical PNA or non-cardiogenic edema. No PE.  SIGNIFICANT EVENTS: 8/29 - admitted 8/30 intubated, ARDS protocol   HISTORY OF PRESENT ILLNESS:  Ethan Stewart is a 69 y.o. M with PMH as outlined below including recently diagnosed (August 2016) diffuse large B cell lymphoma stage IV with inguinal and mesenteric lymphadenopathy plus extranodal involvement of C5 vertebra s/p C5 corpectomy (07/26/15). He has had 1 cycle of R-CHOP with intrathecal methotrexate plus hydrocortisone for CNS prophylaxis (08/09/15). He was seen in oncology clinic 8/26 and was neutropenic for which he was placed on GCSF as well as neutropenic precautions.  He presented to the ED 8/29 with fevers, chills, cough, SOB x 2 days. He was found to have HCAP and was subsequently admitted for further management. He had been seen in ED 1 day prior on 8/27 with similar symptoms but was discharged home.  Later that evening, pt developed worsening SOB to the point it caused him respiratory distress. He was hypoxic despite NRB; therefore, was placed on BiPAP. PCCM was consulted for concerns of further decline / worsening respiratory failure.  Of note, he has residual right arm weakness from cord compression due to C5 tumor. He is under physical therapy treatments.   SUBJECTIVE:  Critically ill, intubated on ards protocol denies CP Afebrile Soft  BP  VITAL SIGNS: Temp:  [96.4 F (35.8 C)-97.9 F (36.6 C)] 96.4 F (35.8 C) (08/31 1000) Pulse Rate:  [58-100] 58 (08/31 0700) Resp:  [11-33] 16 (08/31 1000) BP: (77-181)/(43-91) 91/47 mmHg (08/31 1000) SpO2:  [86 %-99 %] 90 % (08/31 1000) FiO2 (%):  [60 %-100 %] 80 % (08/31 1000) HEMODYNAMICS:   VENTILATOR SETTINGS: Vent Mode:  [-] PRVC FiO2 (%):  [60 %-100 %] 80 % Set Rate:  [18 bmp-28 bmp] 28 bmp Vt Set:  [480 mL-500 mL] 480 mL PEEP:  [10 cmH20-16 cmH20] 14 cmH20 Plateau Pressure:  [22 cmH20-28 cmH20] 28 cmH20 INTAKE / OUTPUT: Intake/Output      08/30 0701 - 08/31 0700 08/31 0701 - 09/01 0700   I.V. (mL/kg) 1751.3 (18) 150 (1.5)   IV Piggyback 2940 532   Total Intake(mL/kg) 4691.3 (48.2) 682 (7)   Urine (mL/kg/hr) 1440 (0.6) 128 (0.3)   Total Output 1440 128   Net +3251.3 +554          PHYSICAL EXAMINATION: General: Male of normal body habitus intubated Neuro: Alert, oriented, chronic RUE weakness HEENT: [place]/AT, no JVD, PERRL  Cardiovascular: RRR, no MRG Lungs: resps even, unlabored,Coarse breath sounds Abdomen: Soft, non-tender, non-distended Musculoskeletal: No acute deformity Skin: Grossly intact  LABS:  CBC  Recent Labs Lab 08/21/15 0316 08/22/15 0320 08/23/15 0350  WBC 9.6 10.4 12.2*  HGB 8.7* 8.7* 7.8*  HCT 25.5* 26.0* 22.8*  PLT 231 273 330   Coag's  Recent Labs Lab 08/21/15 0316  APTT 54*  INR 1.36   BMET  Recent Labs Lab 08/21/15 0316 08/22/15 0320 08/23/15 0350  NA 137  138 137  K 3.6 3.3* 3.5  CL 107 106 107  CO2 24 24 23   BUN 14 9 14   CREATININE 1.00 0.74 0.72  GLUCOSE 102* 177* 154*   Electrolytes  Recent Labs Lab 08/21/15 0316 08/22/15 0320 08/23/15 0350  CALCIUM 7.9* 7.9* 7.7*  MG  --  2.1 2.3  PHOS  --  3.3 3.8   Sepsis Markers  Recent Labs Lab 08/21/15 0316 08/21/15 0615 08/21/15 1300  LATICACIDVEN 1.6 1.2 1.6  PROCALCITON 1.98  --   --    ABG  Recent Labs Lab 08/21/15 1238 08/22/15 1300  08/23/15 0400  PHART 7.472* 7.357 7.393  PCO2ART 31.9* 42.1 36.2  PO2ART 66.6* 81.7 74.1*   Liver Enzymes  Recent Labs Lab 08/19/15 1015 08/20/15 2241 08/23/15 0350  AST 48* 67* 84*  ALT 35 33 37  ALKPHOS 118 135* 123  BILITOT 0.6 0.2* <0.1*  ALBUMIN 2.5* 2.4* 1.7*   Cardiac Enzymes  Recent Labs Lab 08/21/15 1725 08/21/15 2205 08/22/15 0320  TROPONINI 0.05* 0.03 0.03   Glucose No results for input(s): GLUCAP in the last 168 hours.  Imaging Dg Chest Port 1 View  08/23/2015   CLINICAL DATA:  Respiratory failure  EXAM: PORTABLE CHEST - 1 VIEW  COMPARISON:  08/22/2015  FINDINGS: Endotracheal tube is 6 cm above the carina. Nasogastric tube extends into the stomach. Consolidated airspace opacities persist in the central lung regions bilateral, extending into the left base, without significant interval change. No pneumothorax or large effusion evident.  IMPRESSION: Support equipment appears satisfactorily positioned.  No significant interval change in the bilateral airspace opacities.   Electronically Signed   By: Andreas Newport M.D.   On: 08/23/2015 05:27   Dg Chest Port 1 View  08/22/2015   CLINICAL DATA:  ARDS  EXAM: PORTABLE CHEST - 1 VIEW  COMPARISON:  August 22, 2015 study obtained earlier in the day  FINDINGS: Endotracheal tube tip is 7.3 cm above the carina. No pneumothorax. There is widespread airspace consolidation bilaterally, most pronounced in the mid and upper lung zones, stable. There is also patchy infiltrate in the left base, stable. No new opacity. Heart is upper normal in size with pulmonary vascularity within normal limits. No adenopathy apparent.  IMPRESSION: Endotracheal tube as described without pneumothorax. Areas of interstitial and alveolar opacity bilaterally are stable. The appearance is consistent with ARDS. Superimposed pneumonia is suspected. No change in cardiac silhouette.   Electronically Signed   By: Lowella Grip III M.D.   On: 08/22/2015 12:00    Dg Abd Portable 1v  08/22/2015   CLINICAL DATA:  OG tube placement  EXAM: PORTABLE ABDOMEN - 1 VIEW  COMPARISON:  None.  FINDINGS: Enteric tube head tip is in the transpyloric region, likely distal stomach. Nonobstructive bowel gas pattern. No free air organomegaly.  IMPRESSION: NG tube tip in the transpyloric region, likely distal stomach   Electronically Signed   By: Rolm Baptise M.D.   On: 08/22/2015 14:00     ASSESSMENT / PLAN:  PULMONARY A: ETT 8/30 >> ARDS HCAP. DD incl G-CSF related pneumonitis Concern for PCP / PJP given immunocompromised state + recent steroid use. At risk for intubation. P:  ARDS protocol - down to 6cc/kg = 480. PEEP 14 Continue steroids, bronchodilators.   CARDIOVASCULAR A:  Mild troponin leak - suspect demand ischemia. Hypotension - due to PEEP/sedatives P:  Aim for CVP 4-8 range Start levo gtt  RENAL A:  Pseudohypocalcemia -corrects for alb Hypokalemia P:  NS @ 50. Replete lytes as needed  GASTROINTESTINAL A:  GERD. Protein calorie malnutrition. P:  Pantoprazole. Start TFs  HEMATOLOGIC / ONCOLOGIC A:  Diffuse large B cell lymphoma (follows with Dr. Vivien Rossetti) s/p 1 cycle of R-CHOP Anemia. VTE Prophylaxis. P:  Dr. Vivien Rossetti with Heme / Onc following. Transfuse for Hb 7 or lower SCD's / Lovenox.  INFECTIOUS A:  HCAP / ? Atypical PNA.  P:  BCx2 8/29 > ng UCx 8/29 >ng Sputum Cx 8/29 > PCP stain 8/29 >neg U. Legionella 8/29 >neg U. Strep 8/29 >neg RVP 8/29 > nasal swab >> neg RVP 8/30 BAL >> Abx: Zosyn, start date 8/29, day 1/x. Abx: Azithro, start date 8/29 day 1/x Abx: Bactrim, start date 8/29 >> 8/31  Dc bactrim PCT algorithm not helpful given his immunocompromised state.  ENDOCRINE A:  At risk for steroid induced hyperglycemia.  P:  SSI if glucose consistently > 180.  NEUROLOGIC A:  Anxiety on valium Hx C5 tumor - s/p C5 corpectomy with C4-C6 arthrodesis and anterior cervical  plating of C4-C6 (Dr. Ronnald Ramp - 07/26/15). P: Propofol + fent gtt (use versed after D3) Confused with ativan - Use low dose valium for anxiety (home med)   Code Status: Full.  Family updated: 8/31 wife updated  Interdisciplinary Family Meeting v Palliative Care Meeting: Due by: 9/5   The patient is critically ill with multiple organ systems failure and requires high complexity decision making for assessment and support, frequent evaluation and titration of therapies, application of advanced monitoring technologies and extensive interpretation of multiple databases. Critical Care Time devoted to patient care services described in this note independent of APP time is 35 minutes.    Kara Mead MD. Shade Flood. [facility] Pager [phone] If no response call [phone]    08/23/2015 10:58 AM

## 2015-08-23 NOTE — Progress Notes (Signed)
Peripherally Inserted Central Catheter/Midline Placement  The IV Nurse has discussed with the patient and/or persons authorized to consent for the patient, the purpose of this procedure and the potential benefits and risks involved with this procedure.  The benefits include less needle sticks, lab draws from the catheter and patient may be discharged home with the catheter.  Risks include, but not limited to, infection, bleeding, blood clot (thrombus formation), and puncture of an artery; nerve damage and irregular heat beat.  Alternatives to this procedure were also discussed.  PICC/Midline Placement Documentation  PICC Triple Lumen 08/67/61 PICC Left Basilic 48 cm 0 cm (Active)  Indication for Insertion or Continuance of Line Prolonged intravenous therapies;Vasoactive infusions 08/23/2015  9:00 AM  Exposed Catheter (cm) 0 cm 08/23/2015  9:00 AM  Dressing Change Due 08/30/15 08/23/2015  9:00 AM       Ethan Stewart 08/23/2015, 9:33 AM

## 2015-08-23 NOTE — Progress Notes (Signed)
RT paged Dr. Elsworth Soho at approximately 1530 for verbal order given to RN (sputum sampling) - RT needs clarity. RN aware.

## 2015-08-23 NOTE — Progress Notes (Signed)
Initial Nutrition Assessment  DOCUMENTATION CODES:   Not applicable  INTERVENTION:  - If pt unable to be extubated within 24 hours, recommend Vital AF 1.2 @ 50 mL/hr with 30 mL Prostat BID. This regimen, along with kcal from Propofol, will provide 1946 kcal (94% estimated needs), 120 grams protein, and 973 mL free water. - RD will continue to monitor for needs  NUTRITION DIAGNOSIS:   Increased nutrient needs related to catabolic illness, cancer and cancer related treatments as evidenced by estimated needs.  GOAL:   Patient will meet greater than or equal to 90% of their needs  MONITOR:   Vent status, Weight trends, Labs, I & O's  REASON FOR ASSESSMENT:   Ventilator  ASSESSMENT:   69 y.o. male with a history of Diffuse Large B-Cell Lymphoma diagnosed 07/2015 on R-CHOP Chemo Rx last given on 08/09/2015 who presents to the ED with complaints of fevers and chills and cough with SOB x 2 days. He was evaluated in the ED and on chest X-ray was found to have pneumonia and an elevated lactic Acid level . A Sepsis/HCAP workup was initiated and he was placed on IV Vancomycin and Zosyn and referred for admission.   Pt seen for new vent. BMI indicates overweight status. Pt was intubated yesterday (8/30) AM.  Patient is currently intubated on ventilator support MV: 14.4 L/min Temp (24hrs), Avg:97.2 F (36.2 C), Min:96.4 F (35.8 C), Max:97.9 F (36.6 C)  Propofol: 11.6 ml/hr (306 kcal).  Pt reports that pt ate a small amount of breakfast on 8/29 (Regular diet) but does not state what pt ate even after several attempts to ask this question. She states that pt's appetite has been decreasing, most notably being over the past 10 days. She states that he mainly consumed items such as jello and soup. During this time frame, he lost several pounds, per her report.  Per chart review, pt has lost 6 lbs (3% body weight) over the past 1 months which is not significant for time frame. No muscle  or fat wasting noted.  Unable to meet needs. If TF warranted, recommendations provided above. OGT in place. Medications reviewed. Labs reviewed; Ca: 7.7 mg/dL.  Drips: Propofol @ 11.6 mL/hr, Fentanyl @ 150 mcg/hr.   Diet Order:  Diet NPO time specified  Skin:  Reviewed, no issues  Last BM:  8/29  Height:   Ht Readings from Last 1 Encounters:  08/23/15 6\' 1"  (1.854 m)    Weight:   Wt Readings from Last 1 Encounters:  08/21/15 214 lb 8.1 oz (97.3 kg)    Ideal Body Weight:  83.64 kg (kg)  BMI:  Body mass index is 28.31 kg/(m^2).  Estimated Nutritional Needs:   Kcal:  2071  Protein:  117-137 grams  Fluid:  2 L/day  EDUCATION NEEDS:   No education needs identified at this time     Jarome Matin, RD, LDN Inpatient Clinical Dietitian Pager # [phone] After hours/weekend pager # [phone]

## 2015-08-23 NOTE — Telephone Encounter (Signed)
Patients wife stopped by to cancel todays lab as he is in the hospital

## 2015-08-24 ENCOUNTER — Other Ambulatory Visit: Payer: PPO

## 2015-08-24 ENCOUNTER — Inpatient Hospital Stay (HOSPITAL_COMMUNITY): Payer: Non-veteran care

## 2015-08-24 ENCOUNTER — Other Ambulatory Visit: Payer: Self-pay | Admitting: *Deleted

## 2015-08-24 ENCOUNTER — Other Ambulatory Visit: Payer: Self-pay | Admitting: Hematology

## 2015-08-24 DIAGNOSIS — Z9911 Dependence on respirator [ventilator] status: Secondary | ICD-10-CM

## 2015-08-24 DIAGNOSIS — R918 Other nonspecific abnormal finding of lung field: Secondary | ICD-10-CM

## 2015-08-24 LAB — GLUCOSE, CAPILLARY
GLUCOSE-CAPILLARY: 168 mg/dL — AB (ref 65–99)
GLUCOSE-CAPILLARY: 140 mg/dL — AB (ref 65–99)
Glucose-Capillary: 132 mg/dL — ABNORMAL HIGH (ref 65–99)
Glucose-Capillary: 137 mg/dL — ABNORMAL HIGH (ref 65–99)
Glucose-Capillary: 140 mg/dL — ABNORMAL HIGH (ref 65–99)

## 2015-08-24 LAB — BASIC METABOLIC PANEL
Anion gap: 7 (ref 5–15)
BUN: 19 mg/dL (ref 6–20)
CHLORIDE: 108 mmol/L (ref 101–111)
CO2: 25 mmol/L (ref 22–32)
CREATININE: 0.96 mg/dL (ref 0.61–1.24)
Calcium: 8.1 mg/dL — ABNORMAL LOW (ref 8.9–10.3)
GFR calc Af Amer: >60 mL/min (ref >60)
GFR calc non Af Amer: >60 mL/min (ref >60)
GLUCOSE: 149 mg/dL — AB (ref 65–99)
POTASSIUM: 4.3 mmol/L (ref 3.5–5.1)
SODIUM: 140 mmol/L (ref 135–145)

## 2015-08-24 LAB — BLOOD GAS, ARTERIAL
Acid-base deficit: 1.6 mmol/L (ref 0.0–2.0)
Bicarbonate: 24 mEq/L (ref 20.0–24.0)
DRAWN BY: 232811
FIO2: 0.7
LHR: 28 {breaths}/min
MECHVT: 480 mL
O2 Saturation: 90.4 %
PATIENT TEMPERATURE: 36.8
PCO2 ART: 47.8 mmHg — AB (ref 35.0–45.0)
PEEP: 12 cmH2O
PO2 ART: 66.6 mmHg — AB (ref 80.0–100.0)
TCO2: 23.2 mmol/L (ref 0–100)
pH, Arterial: 7.319 — ABNORMAL LOW (ref 7.350–7.450)

## 2015-08-24 LAB — CBC
HEMATOCRIT: 23.9 % — AB (ref 39.0–52.0)
Hemoglobin: 7.9 g/dL — ABNORMAL LOW (ref 13.0–17.0)
MCH: 28.8 pg (ref 26.0–34.0)
MCHC: 33.1 g/dL (ref 30.0–36.0)
MCV: 87.2 fL (ref 78.0–100.0)
PLATELETS: 393 10*3/uL (ref 150–400)
RBC: 2.74 MIL/uL — ABNORMAL LOW (ref 4.22–5.81)
RDW: 17.6 % — AB (ref 11.5–15.5)
WBC: 19.8 10*3/uL — AB (ref 4.0–10.5)

## 2015-08-24 LAB — SAVE SMEAR

## 2015-08-24 LAB — PREPARE RBC (CROSSMATCH)

## 2015-08-24 LAB — ABO/RH: ABO/RH(D): B NEG

## 2015-08-24 MED ORDER — PANTOPRAZOLE SODIUM 40 MG PO PACK
40.0000 mg | PACK | Freq: Every day | ORAL | Status: DC
  Administered 2015-08-24 – 2015-08-31 (×8): 40 mg
  Filled 2015-08-24 (×9): qty 20

## 2015-08-24 MED ORDER — PRO-STAT SUGAR FREE PO LIQD
30.0000 mL | Freq: Two times a day (BID) | ORAL | Status: DC
  Administered 2015-08-24 – 2015-08-26 (×5): 30 mL
  Filled 2015-08-24 (×5): qty 30

## 2015-08-24 MED ORDER — SODIUM CHLORIDE 0.9 % IJ SOLN: 3.0000 mL | INTRAMUSCULAR | Status: DC | PRN

## 2015-08-24 MED ORDER — FREE WATER
100.0000 mL | Freq: Four times a day (QID) | Status: DC
  Administered 2015-08-24 – 2015-08-25 (×4): 100 mL

## 2015-08-24 MED ORDER — SODIUM CHLORIDE 0.9 % IJ SOLN: 10.0000 mL | INTRAMUSCULAR | Status: DC | PRN

## 2015-08-24 MED ORDER — FUROSEMIDE 10 MG/ML IJ SOLN
20.0000 mg | Freq: Once | INTRAMUSCULAR | Status: AC
  Administered 2015-08-24: 20 mg via INTRAVENOUS
  Filled 2015-08-24: qty 2

## 2015-08-24 MED ORDER — HEPARIN SOD (PORK) LOCK FLUSH 100 UNIT/ML IV SOLN
250.0000 [IU] | INTRAVENOUS | Status: DC | PRN
  Filled 2015-08-24: qty 3

## 2015-08-24 MED ORDER — VITAL AF 1.2 CAL PO LIQD
1000.0000 mL | ORAL | Status: DC
  Administered 2015-08-24: 1000 mL
  Filled 2015-08-24 (×2): qty 1000

## 2015-08-24 MED ORDER — INSULIN ASPART 100 UNIT/ML ~~LOC~~ SOLN
0.0000 [IU] | SUBCUTANEOUS | Status: DC
  Administered 2015-08-24 (×4): 1 [IU] via SUBCUTANEOUS
  Administered 2015-08-24: 2 [IU] via SUBCUTANEOUS
  Administered 2015-08-25 – 2015-08-26 (×5): 1 [IU] via SUBCUTANEOUS
  Administered 2015-08-26: 2 [IU] via SUBCUTANEOUS
  Administered 2015-08-26 – 2015-08-28 (×13): 1 [IU] via SUBCUTANEOUS
  Administered 2015-08-29: 2 [IU] via SUBCUTANEOUS
  Administered 2015-08-29 (×2): 1 [IU] via SUBCUTANEOUS
  Administered 2015-08-30 (×2): 2 [IU] via SUBCUTANEOUS
  Administered 2015-08-31: 1 [IU] via SUBCUTANEOUS
  Administered 2015-08-31: 2 [IU] via SUBCUTANEOUS
  Administered 2015-08-31: 1 [IU] via SUBCUTANEOUS

## 2015-08-24 MED ORDER — HEPARIN SOD (PORK) LOCK FLUSH 100 UNIT/ML IV SOLN
500.0000 [IU] | Freq: Every day | INTRAVENOUS | Status: DC | PRN
  Filled 2015-08-24: qty 5

## 2015-08-24 MED ORDER — SODIUM CHLORIDE 0.9 % IV SOLN: 250.0000 mL | Freq: Once | INTRAVENOUS | Status: DC

## 2015-08-24 MED ORDER — SODIUM CHLORIDE 0.9 % IV SOLN: Freq: Once | INTRAVENOUS | Status: AC

## 2015-08-24 NOTE — Progress Notes (Signed)
Ethan Kitchen   HEMATOLOGY/ONCOLOGY INPATIENT PROGRESS NOTE  Date of Service: 08/24/2015  Inpatient Attending: .Rigoberto Noel, MD   SUBJECTIVE  Ethan Stewart was seen this evening.  He remains intubated, sedated and on a ventilator. His oxygen requirements have improved somewhat to an FiO2 of 60%.  No further fevers noted.  Review chest x-ray today.  Appreciate continuing critical-care's by the pulmonary team.  Ordered PRBC transfusion of one unit irradiated. Getting tube feeding.  OBJECTIVE:   PHYSICAL EXAMINATION: . Filed Vitals:   08/24/15 2127 08/24/15 2142 08/24/15 2200 08/24/15 2300  BP: 111/46 118/50 116/48 119/54  Pulse:      Temp: 98.8 F (37.1 C) 98.6 F (37 C) 98.6 F (37 C) 98.6 F (37 C)  TempSrc:  Core (Comment)    Resp: '28 28 28 25  ' Height:      Weight:      SpO2: 95% 96% 95% 96%   Filed Weights   08/21/15 1200 08/23/15 1400 08/24/15 0600  Weight: 214 lb 8.1 oz (97.3 kg) 214 lb 8.1 oz (97.3 kg) 221 lb 1.9 oz (100.3 kg)   .Body mass index is 29.18 kg/(m^2).  GENERAL:alert, intubated on a ventilator, sedated. SKIN: skin color, texture, turgor are normal, no rashes or significant lesions EYES: normal, conjunctiva are pink and non-injected, sclera clear OROPHARYNX:no exudate, no erythema and lips, buccal mucosa, and tongue normal  NECK: supple, no JVD, thyroid normal size, non-tender, without nodularity LYMPH:  no palpable lymphadenopathy in the cervical, axillary or inguinal LUNGS: Continues to have bilateral fine rales. HEART: regular rate & rhythm,  no murmurs and bilateral lower extremity 2+ edema. ABDOMEN: abdomen soft, non-tender, normoactive bowel sounds  Musculoskeletal: no cyanosis of digits and no clubbing  NEURO: no focal motor/sensory deficits  MEDICAL HISTORY:  Past Medical History  Diagnosis Date  . History of bleeding ulcers 1990's  . 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 Anxiety   . GERD (gastroesophageal reflux disease)   . H. pylori infection   .  History of blood transfusion   . Bone cancer      tumor on C5  . Arm numbness 08/07/15    Limited movement due to tumor on spine  . Diffuse large B cell lymphoma     biposy 07/25/2105   SURGICAL HISTORY: Past Surgical History  Procedure Laterality Date  . Hernia repair Bilateral     with mesh  . Colonoscopy    . Eye surgery Right     catheter  . Anterior cervical corpectomy N/A 07/26/2015    Procedure: Cervical five Corpectomy/Cervical four-six Fusion/Plate ;  Surgeon: Eustace Moore, MD;  Location: [facility];  Service: Neurosurgery;  Laterality: N/A;  C5 Corpectomy/C4-6 Fusion/Plate C4-6    SOCIAL HISTORY: Social History   Social History  . Marital Status: Married    Spouse Name: N/A  . Number of Children: N/A  . Years of Education: N/A   Occupational History  . Not on file.   Social History Main Topics  . Smoking status: Former Smoker -- 1.00 packs/day for 10 years    Types: Cigarettes    Quit date: 12/23/1981  . Smokeless tobacco: Never Used  . Alcohol Use: 3.0 oz/week    2 Glasses of wine, 3 Cans of beer per week  . Drug Use: No  . Sexual Activity: Yes   Other Topics Concern  . Not on file   Social History Narrative    FAMILY HISTORY: Family History  Problem Relation Age of  Onset  . Hypertension Mother     ALLERGIES:  has No Known Allergies.  MEDICATIONS:  Scheduled Meds: . sodium chloride  250 mL Intravenous Once  . antiseptic oral rinse  7 mL Mouth Rinse QID  . chlorhexidine gluconate  15 mL Mouth Rinse BID  . dexamethasone  4 mg Intravenous 4 times per day  . enoxaparin (LOVENOX) injection  40 mg Subcutaneous Q24H  . feeding supplement (PRO-STAT SUGAR FREE 64)  30 mL Per Tube BID  . fentaNYL (SUBLIMAZE) injection  50 mcg Intravenous Once  . free water  100 mL Per Tube 4 times per day  . insulin aspart  0-9 Units Subcutaneous 6 times per day  . pantoprazole sodium  40 mg Per Tube Daily  . piperacillin-tazobactam (ZOSYN)  IV  3.375 g Intravenous  Q8H  . sodium chloride  1,000 mL Intravenous Once  . sodium chloride  10-40 mL Intracatheter Q12H   Continuous Infusions: . feeding supplement (VITAL AF 1.2 CAL) 1,000 mL (08/24/15 1155)  . fentaNYL infusion INTRAVENOUS 275 mcg/hr (08/24/15 2300)  . norepinephrine (LEVOPHED) Adult infusion Stopped (08/24/15 0800)  . propofol (DIPRIVAN) infusion 20 mcg/kg/min (08/24/15 2337)   PRN Meds:.acetaminophen **OR** acetaminophen, albuterol, fentaNYL, heparin lock flush, heparin lock flush, neomycin-bacitracin-polymyxin, [DISCONTINUED] ondansetron **OR** ondansetron (ZOFRAN) IV, sodium chloride, sodium chloride, sodium chloride  REVIEW OF SYSTEMS:    10 Point review of Systems was done is negative except as noted above.   LABORATORY DATA:  I have reviewed the data as listed  . CBC Latest Ref Rng 08/24/2015 08/23/2015 08/22/2015  WBC 4.0 - 10.5 K/uL 19.8(H) 12.2(H) 10.4  Hemoglobin 13.0 - 17.0 g/dL 7.9(L) 7.8(L) 8.7(L)  Hematocrit 39.0 - 52.0 % 23.9(L) 22.8(L) 26.0(L)  Platelets 150 - 400 K/uL 393 330 273    . CMP Latest Ref Rng 08/24/2015 08/23/2015 08/22/2015  Glucose 65 - 99 mg/dL 149(H) 154(H) 177(H)  BUN 6 - 20 mg/dL '19 14 9  ' Creatinine 0.61 - 1.24 mg/dL 0.96 0.72 0.74  Sodium 135 - 145 mmol/L 140 137 138  Potassium 3.5 - 5.1 mmol/L 4.3 3.5 3.3(L)  Chloride 101 - 111 mmol/L 108 107 106  CO2 22 - 32 mmol/L '25 23 24  ' Calcium 8.9 - 10.3 mg/dL 8.1(L) 7.7(L) 7.9(L)  Total Protein 6.5 - 8.1 g/dL - 4.8(L) -  Total Bilirubin 0.3 - 1.2 mg/dL - <0.1(L) -  Alkaline Phos 38 - 126 U/L - 123 -  AST 15 - 41 U/L - 84(H) -  ALT 17 - 63 U/L - 37 -   Sedimentation rate 100 down from 125 CRP 19.3 down from 45.5  CMV IgG positive CMV IgM negative CMV DNA by PCR pending   Bronchoalveolar lavage RareWBC present both PMN and mononuclear. No squamous epithelial cells. No organisms seen.  Pneumocystis smear negative  Fungal cultures note yeast or fungal elements noted.         RADIOGRAPHIC  STUDIES:ASSESSMENT & PLAN:   #1  Diffuse large B-cell lymphoma stage IV AE with involvement of mesenteric, inguinal lymph nodes and C5 vertebral body with spinal cord impingement.  He had a C5 corpectomy on 07/26/2015 that showed diffuse large B-cell lymphoma with a Ki-67 ranging from 20 to 90%.    DLBCL (diffuse large B cell lymphoma)   06/21/2015 Imaging CT Chest/abd/pelvis: IMPRESSION: 5 mm nodule within the right lower lobe as described.  Pneumobilia consistent with a prior sphincterotomy.  No acute abnormality is identified. No findings to suggest chest etiology for the known  lesion at C5 are seen.   06/21/2015 Imaging MRI c spine1. Diffuse marrow replacement of the C5 vertebral body highly concerning for neoplasm (metastatic disease, myeloma, or lymphoma). Epidural tumor at this level results in moderate spinal stenosis with moderate impression on the spinal cord    07/10/2015 PET scan 1. The previously demonstrated abnormal C5 vertebral body is hypermetabolic. No other osseous lesions demonstrated. 2. There are small hypermetabolic lymph nodes within the left mesentery and both inguinal regions.    07/26/2015 Initial Biopsy Had a C5 corpectomy pathology showed diffuse large B-cell lymphoma   08/08/2015 -  Chemotherapy Started for cycle of R CHOP.    08/09/2015 -  Chemotherapy Received first cycle of intrathecal methotrexate plus hydrocortisone for CNS prophylaxis.   He is currently between cycles of R CHOP chemotherapy. We might need to dose adjust his chemotherapy once he is better to reduce the risk of neutropenic complications since be might be hard pressed to not use any G-CSF.   #2 ARDS with fevers and bilateral groundglass pulmonary opacities. Noncardiogenic pulmonary edema normal BNP. Mild troponin leak due to demand ischemia which normalized.  Patient was briefly neutropenic but has responded very well to Neulasta. Somewhat elevated pro-calcitonin levels did not allow for ruling out  sepsis.  Overall picture appears increasingly more likely related to Neulasta. Neulasta itself would increase the risk of ARDS like picture/pneumonitis in the setting of a lung infection/lung injury from chemotherapy and this typically presents with recovering WBC counts as with Ethan Stewart. His Neulasta should soon be out of his system typically lasts for 12-14 days after administration on 8/19.  Blood cultures negative thus far Viral respiratory panel negative Bronchoalveolar lavage with no overt organisms seen on Gram stain. PCP negative. CMV IgG positive IgM negative PCR pending. Elevated sedimentation rate and CRP now showing improvement which is a good sign.  Urine testing negative for Legionella antigen and Streptococcus pneumoniae.  #3 hypoxic respiratory failure due to #2  #4 anemia related to chemotherapy and lymphoma as well as possible sepsis. Plan -Continues on IV Zosyn -We will have to avoid using any G-CSF in the future. -Appreciate care by the pulmonary and critical care team -continue dexamethasone to 4 mg IV every 6 h --Continue close monitoring. -ordered PRBC transfusion with irradiated products to maintain hemoglobin closer to 9 given limited bone marrow reserve and multiple metabolic and cardiopulmonary stressors.   Appreciate excellent critical care/ pulmonary care, hospitalist and ID input.   The total time spent  was 15 minutes and more than 50% was on counseling and direct patient cares.    Sullivan Lone MD Gregory AAHIVMS [facility] Hematology/Oncology Physician [place]  (Office):       [phone] (Work cell):  [phone] (Fax):           [phone]  08/24/2015 11:56 PM

## 2015-08-24 NOTE — Progress Notes (Signed)
Nutrition Follow-up  DOCUMENTATION CODES:   Not applicable  INTERVENTION:  - Will order Vital AF 1.2 @ 55 mL/hr with 30 mL Prostat BID and 100 mL free water Q6h. This regimen, including kcal from Propofol, will provide 2169 kcal, 129 grams protein, and 1470 mL free water.  - RD will continue to monitor for needs  NUTRITION DIAGNOSIS:   Increased nutrient needs related to catabolic illness, cancer and cancer related treatments as evidenced by estimated needs. -ongoing  GOAL:   Patient will meet greater than or equal to 90% of their needs -currently unmet  MONITOR:   Vent status, Weight trends, Labs, I & O's  REASON FOR ASSESSMENT:   Consult Enteral/tube feeding initiation and management  ASSESSMENT:   69 y.o. male with a history of Diffuse Large B-Cell Lymphoma diagnosed 07/2015 on R-CHOP Chemo Rx last given on 08/09/2015 who presents to the ED with complaints of fevers and chills and cough with SOB x 2 days. He was evaluated in the ED and on chest X-ray was found to have pneumonia and an elevated lactic Acid level . A Sepsis/HCAP workup was initiated and he was placed on IV Vancomycin and Zosyn and referred for admission.   9/1  Patient is currently intubated on ventilator support MV: 15.4 L/min Temp (24hrs), Avg:97.8 F (36.6 C), Min:96.4 F (35.8 C), Max:98.4 F (36.9 C)  Propofol: 14.6 ml/hr (385 kcal).  Needs re-estimated based on current vent status and Tmax. OGT in place and pt currently receiving Oxepa @ 40 mL/hr with 20 ml free water Q4h which is providing 1825 kcal including Propofol kcal, 60 grams protein, and  874 mL free water which is not meeting needs.  Will order TF as outlined above. Medications reviewed. Labs reviewed; Ca: 8.1 mg/dL which is up from 7.7 mg/dL yesterday.    8/31 - Pt intubated with MV: 14.4 L/min and Propofol @ 11.6 mL/hr (306 kcal). - Wife reports pt ate small amount of breakfast 8/29 on Regular diet. - Appetite decreasing PTA,  most notably over the past 10 days. - Per chart review, pt has lost 6 lbs (3% body weight) over the past 1 months which is not significant for time frame. No muscle or fat wasting noted.  Diet Order:  Diet NPO time specified  Skin:  Reviewed, no issues  Last BM:  8/29  Height:   Ht Readings from Last 1 Encounters:  08/23/15 6\' 1"  (1.854 m)    Weight:   Wt Readings from Last 1 Encounters:  08/24/15 221 lb 1.9 oz (100.3 kg)    Ideal Body Weight:  83.64 kg (kg)  BMI:  Body mass index is 29.18 kg/(m^2).  Estimated Nutritional Needs:   Kcal:  2181  Protein:  120-140 grams  Fluid:  2 L/day  EDUCATION NEEDS:   No education needs identified at this time     Jarome Matin, RD, LDN Inpatient Clinical Dietitian Pager # [phone] After hours/weekend pager # [phone]

## 2015-08-24 NOTE — Progress Notes (Signed)
August 24, 2015 Velva Harman, RN, BSN, CCM: No discharge needs at time chart review.  Will follow for changes and needs. Update: remains vent dependant.

## 2015-08-24 NOTE — Progress Notes (Signed)
PULMONARY / CRITICAL CARE MEDICINE   Name: Ethan Stewart MRN: [id number] DOB: 28-Jan-1946    ADMISSION DATE:  08/20/2015 CONSULTATION DATE:  08/24/2015  REFERRING MD : Wynelle Cleveland  CHIEF COMPLAINT: SOB  INITIAL PRESENTATION: 69 y.o. M with diffuse large B cell lymphoma, brought to [facility] ED 8/29 with HCAP. Developed worsening hypoxia and respiratory distress later that evening; therefore, PCCM called for further recs..    STUDIES:  CXR 8/29 >>> worsened central lung alveolar opacities bilaterally, suspicious for infectious infiltrates. CTA chest 8/29 >>> b/l airspace disease c/w atypical PNA or non-cardiogenic edema. No PE.  SIGNIFICANT EVENTS: 8/29 - admitted 8/30 intubated, ARDS protocol   HISTORY OF PRESENT ILLNESS:  Ethan Stewart is a 69 y.o. M with PMH as outlined below including recently diagnosed (August 2016) diffuse large B cell lymphoma stage IV with inguinal and mesenteric lymphadenopathy plus extranodal involvement of C5 vertebra s/p C5 corpectomy (07/26/15). He has had 1 cycle of R-CHOP with intrathecal methotrexate plus hydrocortisone for CNS prophylaxis (08/09/15). He was seen in oncology clinic 8/26 and was neutropenic for which he was placed on GCSF as well as neutropenic precautions.  He presented to the ED 8/29 with fevers, chills, cough, SOB x 2 days. He was found to have HCAP and was subsequently admitted for further management. He had been seen in ED 1 day prior on 8/27 with similar symptoms but was discharged home.  Later that evening, pt developed worsening SOB to the point it caused him respiratory distress. He was hypoxic despite NRB; therefore, was placed on BiPAP. PCCM was consulted for concerns of further decline / worsening respiratory failure.  Of note, he has residual right arm weakness from cord compression due to C5 tumor. He is under physical therapy treatments.   SUBJECTIVE:  Remains Critically ill, intubated on ards protocol On  60%/+10 Afebrile Soft BP  VITAL SIGNS: Temp:  [96.4 F (35.8 C)-98.4 F (36.9 C)] 98.2 F (36.8 C) (09/01 0845) Pulse Rate:  [56-57] 57 (09/01 0312) Resp:  [10-41] 28 (09/01 0845) BP: (85-126)/(43-63) 97/45 mmHg (09/01 0845) SpO2:  [90 %-96 %] 95 % (09/01 0845) FiO2 (%):  [70 %-100 %] 70 % (09/01 0746) Weight:  [214 lb 8.1 oz (97.3 kg)-221 lb 1.9 oz (100.3 kg)] 221 lb 1.9 oz (100.3 kg) (09/01 0600) HEMODYNAMICS: CVP:  [9 mmHg-18 mmHg] 11 mmHg VENTILATOR SETTINGS: Vent Mode:  [-] PRVC FiO2 (%):  [70 %-100 %] 70 % Set Rate:  [28 bmp] 28 bmp Vt Set:  [480 mL] 480 mL PEEP:  [10 cmH20-14 cmH20] 10 cmH20 Plateau Pressure:  [17 cmH20-30 cmH20] 20 cmH20 INTAKE / OUTPUT: Intake/Output      08/31 0701 - 09/01 0700 09/01 0701 - 09/02 0700   I.V. (mL/kg) 2063.6 (20.6) 103.4 (1)   NG/GT 420 40   IV Piggyback 634 50   Total Intake(mL/kg) 3117.6 (31.1) 193.4 (1.9)   Urine (mL/kg/hr) 2043 (0.8) 225 (1)   Emesis/NG output 700 (0.3)    Total Output 2743 225   Net +374.6 -31.6          PHYSICAL EXAMINATION: General: Male of normal body habitus intubated Neuro: RASS-3, chronic RUE weakness, agitated on WUA HEENT: [place]/AT, no JVD, PERRL  Cardiovascular: RRR, no MRG Lungs: resps even, unlabored,Coarse breath sounds Abdomen: Soft, non-tender, non-distended Musculoskeletal: No acute deformity Skin: Grossly intact  LABS:  CBC  Recent Labs Lab 08/22/15 0320 08/23/15 0350 08/24/15 0500  WBC 10.4 12.2* 19.8*  HGB 8.7* 7.8* 7.9*  HCT  26.0* 22.8* 23.9*  PLT 273 330 393   Coag's  Recent Labs Lab 08/21/15 0316  APTT 54*  INR 1.36   BMET  Recent Labs Lab 08/22/15 0320 08/23/15 0350 08/24/15 0500  NA 138 137 140  K 3.3* 3.5 4.3  CL 106 107 108  CO2 24 23 25   BUN 9 14 19   CREATININE 0.74 0.72 0.96  GLUCOSE 177* 154* 149*   Electrolytes  Recent Labs Lab 08/22/15 0320 08/23/15 0350 08/24/15 0500  CALCIUM 7.9* 7.7* 8.1*  MG 2.1 2.3  --   PHOS 3.3 3.8  --     Sepsis Markers  Recent Labs Lab 08/21/15 0316 08/21/15 0615 08/21/15 1300  LATICACIDVEN 1.6 1.2 1.6  PROCALCITON 1.98  --   --    ABG  Recent Labs Lab 08/22/15 1300 08/23/15 0400 08/24/15 0315  PHART 7.357 7.393 7.319*  PCO2ART 42.1 36.2 47.8*  PO2ART 81.7 74.1* 66.6*   Liver Enzymes  Recent Labs Lab 08/19/15 1015 08/20/15 2241 08/23/15 0350  AST 48* 67* 84*  ALT 35 33 37  ALKPHOS 118 135* 123  BILITOT 0.6 0.2* <0.1*  ALBUMIN 2.5* 2.4* 1.7*   Cardiac Enzymes  Recent Labs Lab 08/21/15 1725 08/21/15 2205 08/22/15 0320  TROPONINI 0.05* 0.03 0.03   Glucose  Recent Labs Lab 08/23/15 1609 08/24/15 0750  GLUCAP 127* 168*    Imaging Dg Chest Port 1 View  08/24/2015   CLINICAL DATA:  Respiratory failure, sepsis, ARDS.  EXAM: PORTABLE CHEST - 1 VIEW  COMPARISON:  Portable chest x-ray of August 23, 2015.  FINDINGS: The lungs are adequately inflated. There are fluffy alveolar opacities bilaterally. The retrocardiac region on the left is more dense today with obscuration of the hemidiaphragm. The cardiac silhouette is mildly enlarged. The pulmonary vascularity is indistinct. There is no pneumothorax. The endotracheal tube tip lies 3.7 cm above the crotch of the carina. The esophagogastric tube tip projects below the inferior margin of the image.  IMPRESSION: Slight interval worsening in the appearance of the pulmonary interstitium bilaterally with increasing atelectasis or infiltrate in the left lower lobe. The pattern is consistent with ARDS. Stable mild pulmonary vascular congestion.   Electronically Signed   By: David  Martinique M.D.   On: 08/24/2015 07:35     ASSESSMENT / PLAN:  PULMONARY A: ETT 8/30 >> ARDS HCAP. DD incl G-CSF related pneumonitis Concern for PCP / PJP given immunocompromised state + recent steroid use. At risk for intubation. P:  ARDS protocol - down to 6cc/kg = 480. PEEP 10- dial down FIO2 as tolerated Continue steroids,  bronchodilators.   CARDIOVASCULAR A:  Mild troponin leak - suspect demand ischemia. Hypotension - due to PEEP/sedatives P:  Aim for CVP 4-8 range, give lasix 20 x 1 Off  levo gtt  RENAL A:  Pseudohypocalcemia -corrects for alb Hypokalemia P:  Dc IVFs Replete lytes as needed  GASTROINTESTINAL A:  GERD. Protein calorie malnutrition. P:  Pantoprazole. Ct TFs  HEMATOLOGIC / ONCOLOGIC A:  Diffuse large B cell lymphoma (follows with Dr. Vivien Rossetti) s/p 1 cycle of R-CHOP Anemia. VTE Prophylaxis. P:  Dr. Vivien Rossetti with Heme / Onc following. Transfuse for Hb 7 or lower SCD's / Lovenox.  INFECTIOUS A:  HCAP / ? Atypical PNA.  P:  BCx2 8/29 > ng UCx 8/29 >ng Sputum Cx 8/29 > PCP stain 8/29 >neg U. Legionella 8/29 >neg U. Strep 8/29 >neg RVP 8/29 > nasal swab >> neg RVP 8/30 BAL >> Abx: Zosyn, start date 8/29 >>  Abx: Azithro, start date 8/29 >> Abx: Bactrim, start date 8/29 >> 8/31 Vanc 8/29 >>  Dc bactrim PCT algorithm not helpful given his immunocompromised state. Doubt need ofr gancyclovir  ENDOCRINE A:  At risk for steroid induced hyperglycemia.  P:  SSI -sensitive  NEUROLOGIC A:  Anxiety on valium Hx C5 tumor - s/p C5 corpectomy with C4-C6 arthrodesis and anterior cervical plating of C4-C6 (Dr. Ronnald Ramp - 07/26/15). P: Propofol + fent gtt (use versed after 9/2) Confused with ativan - Use low dose valium for anxiety (home med)   Code Status: Full.  Family updated: 9/1 wife updated daily  Interdisciplinary Family Meeting v Palliative Care Meeting: Due by: 9/5   Summary - ARDS , mild improvement in hypoxia, prognosis remains guarded but optimistic given 1 organ failure  The patient is critically ill with multiple organ systems failure and requires high complexity decision making for assessment and support, frequent evaluation and titration of therapies, application of advanced monitoring technologies and extensive  interpretation of multiple databases. Critical Care Time devoted to patient care services described in this note independent of APP time is 35 minutes.    Kara Mead MD. Shade Flood. [place] [facility] Pager [phone] If no response call [phone]    08/24/2015 9:20 AM

## 2015-08-24 NOTE — Progress Notes (Signed)
Patient ID: Ethan Stewart, male   DOB: 1946/05/31, 69 y.o.   MRN: [id number]         [facility] for Infectious Disease    Date of Admission:  08/20/2015           Day 4 piperacillin tazobactam  Principal Problem:   HCAP (healthcare-associated pneumonia) Active Problems:   DLBCL (diffuse large B cell lymphoma)   Sepsis   Anemia associated with chemotherapy   Sinus tachycardia   GERD (gastroesophageal reflux disease)   Blood poisoning   Hypoxia   Acute on chronic respiratory failure with hypoxia   ARDS (adult respiratory distress syndrome)   . antiseptic oral rinse  7 mL Mouth Rinse QID  . chlorhexidine gluconate  15 mL Mouth Rinse BID  . dexamethasone  4 mg Intravenous 4 times per day  . enoxaparin (LOVENOX) injection  40 mg Subcutaneous Q24H  . feeding supplement (PRO-STAT SUGAR FREE 64)  30 mL Per Tube BID  . fentaNYL (SUBLIMAZE) injection  50 mcg Intravenous Once  . free water  100 mL Per Tube 4 times per day  . insulin aspart  0-9 Units Subcutaneous 6 times per day  . pantoprazole sodium  40 mg Per Tube Daily  . piperacillin-tazobactam (ZOSYN)  IV  3.375 g Intravenous Q8H  . sodium chloride  1,000 mL Intravenous Once  . sodium chloride  10-40 mL Intracatheter Q12H   Review of Systems: Review of systems not obtained due to patient factors.  Past Medical History  Diagnosis Date  . History of bleeding ulcers 1990's  . 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 Anxiety   . GERD (gastroesophageal reflux disease)   . H. pylori infection   . History of blood transfusion   . Bone cancer      tumor on C5  . Arm numbness 08/07/15    Limited movement due to tumor on spine  . Diffuse large B cell lymphoma     biposy 07/25/2105    Social History  Substance Use Topics  . Smoking status: Former Smoker -- 1.00 packs/day for 10 years    Types: Cigarettes    Quit date: 12/23/1981  . Smokeless tobacco: Never Used  . Alcohol Use: 3.0 oz/week    2 Glasses of wine, 3 Cans of  beer per week    Family History  Problem Relation Age of Onset  . Hypertension Mother    No Known Allergies  OBJECTIVE: Filed Vitals:   08/24/15 1000 08/24/15 1030 08/24/15 1100 08/24/15 1200  BP: 100/47 126/56 113/53 110/51  Pulse:      Temp: 98.2 F (36.8 C) 98.1 F (36.7 C) 98.1 F (36.7 C)   TempSrc:      Resp: 25 22 28 19   Height:      Weight:      SpO2: 93% 91% 93% 89%   Body mass index is 29.18 kg/(m^2).  General: He is unresponsive on the ventilator Skin: No rash Lungs: Clear anteriorly Cor: Regular S1 and S2 with no murmur Abdomen: Soft without palpable masses IV site looks good  Lab Results Lab Results  Component Value Date   WBC 19.8* 08/24/2015   HGB 7.9* 08/24/2015   HCT 23.9* 08/24/2015   MCV 87.2 08/24/2015   PLT 393 08/24/2015    Lab Results  Component Value Date   CREATININE 0.96 08/24/2015   BUN 19 08/24/2015   NA 140 08/24/2015   K 4.3 08/24/2015   CL 108 08/24/2015   CO2  25 08/24/2015    Lab Results  Component Value Date   ALT 37 08/23/2015   AST 84* 08/23/2015   ALKPHOS 123 08/23/2015   BILITOT <0.1* 08/23/2015    SED RATE (mm/hr)  Date Value  08/23/2015 100*  08/21/2015 125*  08/07/2015 38*   CRP (mg/dL)  Date Value  08/23/2015 19.3*  08/21/2015 45.5*  08/07/2015 8.3*   Microbiology: Recent Results (from the past 240 hour(s))  Blood culture (routine x 2)     Status: None (Preliminary result)   Collection Time: 08/20/15 10:41 PM  Result Value Ref Range Status   Specimen Description BLOOD LEFT ANTECUBITAL  Final   Special Requests BOTTLES DRAWN AEROBIC AND ANAEROBIC 5ML  Final   Culture   Final    NO GROWTH 3 DAYS Performed at [facility]    Report Status PENDING  Incomplete  Urine culture     Status: None   Collection Time: 08/21/15 12:59 AM  Result Value Ref Range Status   Specimen Description URINE, RANDOM  Final   Special Requests Immunocompromised  Final   Culture   Final    NO GROWTH 1  DAY Performed at [facility]    Report Status 08/22/2015 FINAL  Final  Culture, blood (routine x 2)     Status: None (Preliminary result)   Collection Time: 08/21/15  5:25 PM  Result Value Ref Range Status   Specimen Description BLOOD LEFT HAND  Final   Special Requests IN PEDIATRIC BOTTLE  4CC  Final   Culture   Final    NO GROWTH 3 DAYS Performed at [facility]    Report Status PENDING  Incomplete  Respiratory virus panel     Status: None   Collection Time: 08/21/15  5:26 PM  Result Value Ref Range Status   Source - RVPAN NOSE  Corrected   Respiratory Syncytial Virus A Negative Negative Final   Respiratory Syncytial Virus B Negative Negative Final   Influenza A Negative Negative Final   Influenza B Negative Negative Final   Parainfluenza 1 Negative Negative Final   Parainfluenza 2 Negative Negative Final   Parainfluenza 3 Negative Negative Final   Metapneumovirus Negative Negative Final   Rhinovirus Negative Negative Final   Adenovirus Negative Negative Final    Comment: (NOTE) Performed At: [facility] [place], [place] [id number] Lindon Romp MD [id number]   Culture, blood (routine x 2)     Status: None (Preliminary result)   Collection Time: 08/21/15  5:35 PM  Result Value Ref Range Status   Specimen Description BLOOD LEFT HAND  Final   Special Requests BOTTLES DRAWN AEROBIC AND ANAEROBIC  6CC  Final   Culture   Final    NO GROWTH 3 DAYS Performed at [facility]    Report Status PENDING  Incomplete  Culture, bal-quantitative     Status: None (Preliminary result)   Collection Time: 08/22/15 12:48 PM  Result Value Ref Range Status   Specimen Description BRONCHIAL ALVEOLAR LAVAGE  Final   Special Requests Immunocompromised  Final   Gram Stain   Final    RARE WBC PRESENT,BOTH PMN AND MONONUCLEAR NO SQUAMOUS EPITHELIAL CELLS SEEN NO ORGANISMS SEEN Performed at [facility]    Culture   Final     Non-Pathogenic Oropharyngeal-type Flora Isolated. Performed at [facility]    Report Status PENDING  Incomplete  Fungus Culture with Smear     Status: None (Preliminary result)  Collection Time: 08/22/15 12:48 PM  Result Value Ref Range Status   Specimen Description BRONCHIAL ALVEOLAR LAVAGE  Final   Special Requests Immunocompromised  Final   Fungal Smear   Final    NO YEAST OR FUNGAL ELEMENTS SEEN Performed at [facility]    Culture   Final    CULTURE IN PROGRESS FOR FOUR WEEKS Performed at [facility]    Report Status PENDING  Incomplete  Pneumocystis smear by DFA     Status: None   Collection Time: 08/22/15 12:49 PM  Result Value Ref Range Status   Specimen Source-PJSRC BRONCHIAL ALVEOLAR LAVAGE  Final   Pneumocystis jiroveci Ag NEGATIVE  Final    Comment: Performed at [facility]     ASSESSMENT: He remains unclear whether or not he has healthcare associated pneumonia or acute lung injury related to his recent G-CSF therapy. So far all microbiology studies including blood cultures, respiratory virus panel and BAL stains and cultures have been negative. I will continue piperacillin tazobactam for at least 7 days.  PLAN: 1. Continue piperacillin tazobactam 2. I will follow up in the morning  Michel Bickers, MD [facility] for [place] [phone] pager   [phone] cell 08/24/2015, 2:47 PM

## 2015-08-25 ENCOUNTER — Telehealth (HOSPITAL_COMMUNITY): Payer: Self-pay

## 2015-08-25 ENCOUNTER — Inpatient Hospital Stay (HOSPITAL_COMMUNITY): Payer: Non-veteran care

## 2015-08-25 LAB — CBC
HEMATOCRIT: 24.7 % — AB (ref 39.0–52.0)
HEMOGLOBIN: 8.3 g/dL — AB (ref 13.0–17.0)
MCH: 29.5 pg (ref 26.0–34.0)
MCHC: 33.6 g/dL (ref 30.0–36.0)
MCV: 87.9 fL (ref 78.0–100.0)
Platelets: 355 10*3/uL (ref 150–400)
RBC: 2.81 MIL/uL — AB (ref 4.22–5.81)
RDW: 17.5 % — ABNORMAL HIGH (ref 11.5–15.5)
WBC: 17.8 10*3/uL — AB (ref 4.0–10.5)

## 2015-08-25 LAB — RESPIRATORY VIRUS PANEL
ADENOVIRUS: NEGATIVE
Influenza A: NEGATIVE
Influenza B: NEGATIVE
METAPNEUMOVIRUS: NEGATIVE
Parainfluenza 1: NEGATIVE
Parainfluenza 2: NEGATIVE
Parainfluenza 3: NEGATIVE
RESPIRATORY SYNCYTIAL VIRUS B: NEGATIVE
RHINOVIRUS: NEGATIVE
Respiratory Syncytial Virus A: NEGATIVE

## 2015-08-25 LAB — GLUCOSE, CAPILLARY
GLUCOSE-CAPILLARY: 120 mg/dL — AB (ref 65–99)
GLUCOSE-CAPILLARY: 140 mg/dL — AB (ref 65–99)
GLUCOSE-CAPILLARY: 142 mg/dL — AB (ref 65–99)
GLUCOSE-CAPILLARY: 140 mg/dL — AB (ref 65–99)
Glucose-Capillary: 141 mg/dL — ABNORMAL HIGH (ref 65–99)

## 2015-08-25 LAB — BASIC METABOLIC PANEL
ANION GAP: 5 (ref 5–15)
BUN: 31 mg/dL — ABNORMAL HIGH (ref 6–20)
CHLORIDE: 110 mmol/L (ref 101–111)
CO2: 30 mmol/L (ref 22–32)
CREATININE: 0.86 mg/dL (ref 0.61–1.24)
Calcium: 8.3 mg/dL — ABNORMAL LOW (ref 8.9–10.3)
GFR calc non Af Amer: >60 mL/min (ref >60)
Glucose, Bld: 128 mg/dL — ABNORMAL HIGH (ref 65–99)
Potassium: 4.5 mmol/L (ref 3.5–5.1)
SODIUM: 145 mmol/L (ref 135–145)

## 2015-08-25 LAB — TYPE AND SCREEN
ABO/RH(D): B NEG
ANTIBODY SCREEN: NEGATIVE
Unit division: 0

## 2015-08-25 LAB — CULTURE, BAL-QUANTITATIVE: COLONY COUNT: NO GROWTH

## 2015-08-25 LAB — PHOSPHORUS: PHOSPHORUS: 3.3 mg/dL (ref 2.5–4.6)

## 2015-08-25 LAB — CULTURE, BAL-QUANTITATIVE W GRAM STAIN: Culture: NO GROWTH

## 2015-08-25 LAB — PREPARE RBC (CROSSMATCH)

## 2015-08-25 LAB — MAGNESIUM: MAGNESIUM: 2.7 mg/dL — AB (ref 1.7–2.4)

## 2015-08-25 LAB — LACTATE DEHYDROGENASE: LDH: 393 U/L — AB (ref 98–192)

## 2015-08-25 LAB — TRIGLYCERIDES: TRIGLYCERIDES: 160 mg/dL — AB (ref <150)

## 2015-08-25 LAB — CMV DNA BY PCR, QUALITATIVE: CMV DNA QL PCR: POSITIVE — AB

## 2015-08-25 LAB — C-REACTIVE PROTEIN: CRP: 7.8 mg/dL — AB (ref <1.0)

## 2015-08-25 LAB — SEDIMENTATION RATE: SED RATE: 61 mm/h — AB (ref 0–16)

## 2015-08-25 MED ORDER — FREE WATER
50.0000 mL | Freq: Four times a day (QID) | Status: DC
  Administered 2015-08-25 – 2015-08-26 (×4): 50 mL

## 2015-08-25 MED ORDER — DIAZEPAM 1 MG/ML PO SOLN: 5.0000 mg | Freq: Three times a day (TID) | ORAL | Status: DC | PRN

## 2015-08-25 MED ORDER — MIDAZOLAM HCL 2 MG/2ML IJ SOLN
1.0000 mg | INTRAMUSCULAR | Status: DC | PRN
  Administered 2015-08-25 (×3): 2 mg via INTRAVENOUS
  Filled 2015-08-25 (×3): qty 2

## 2015-08-25 MED ORDER — POTASSIUM CHLORIDE 20 MEQ/15ML (10%) PO SOLN
20.0000 meq | Freq: Once | ORAL | Status: AC
  Administered 2015-08-25: 20 meq
  Filled 2015-08-25: qty 15

## 2015-08-25 MED ORDER — VITAL AF 1.2 CAL PO LIQD
1000.0000 mL | ORAL | Status: DC
  Administered 2015-08-26 – 2015-08-28 (×2): 1000 mL
  Filled 2015-08-25 (×7): qty 1000

## 2015-08-25 MED ORDER — FUROSEMIDE 10 MG/ML IJ SOLN
40.0000 mg | Freq: Two times a day (BID) | INTRAMUSCULAR | Status: DC
  Administered 2015-08-25 – 2015-08-27 (×6): 40 mg via INTRAVENOUS
  Filled 2015-08-25 (×6): qty 4

## 2015-08-25 MED ORDER — CHLORHEXIDINE GLUCONATE 0.12 % MT SOLN
OROMUCOSAL | Status: AC
  Administered 2015-08-25: 21:00:00
  Filled 2015-08-25: qty 15

## 2015-08-25 MED ORDER — DIAZEPAM 5 MG PO TABS: 5.0000 mg | ORAL_TABLET | Freq: Three times a day (TID) | ORAL | Status: DC | PRN

## 2015-08-25 NOTE — Progress Notes (Signed)
Nutrition Follow-up  DOCUMENTATION CODES:   Not applicable  INTERVENTION:  - Will change TF regimen: Vital AF 1.2 @ 60 mL/hr with 30 mL Prostat BID and 50 mL free water Q6h. This regimen, including kcal from Propofol, provides 2234 kcal, 138 grams protein, and  1368 mL free water - RD will continue to monitor for needs  NUTRITION DIAGNOSIS:   Increased nutrient needs related to catabolic illness, cancer and cancer related treatments as evidenced by estimated needs. -ongoing  GOAL:   Patient will meet greater than or equal to 90% of their needs -met with TF  MONITOR:   Vent status, Weight trends, Labs, I & O's  ASSESSMENT:   69 y.o. male with a history of Diffuse Large B-Cell Lymphoma diagnosed 07/2015 on R-CHOP Chemo Rx last given on 08/09/2015 who presents to the ED with complaints of fevers and chills and cough with SOB x 2 days. He was evaluated in the ED and on chest X-ray was found to have pneumonia and an elevated lactic Acid level . A Sepsis/HCAP workup was initiated and he was placed on IV Vancomycin and Zosyn and referred for admission.   9/2 Patient is currently intubated on ventilator support MV: 14.4 L/min Temp (24hrs), Avg:98.6 F (37 C), Min:98.1 F (36.7 C), Max:99.3 F (37.4 C)  Propofol: 11.6 ml/hr (306 kcal)  Needs have been re-estimated based on current ventilation needs. TF regimen adjusted as outlined above related to this and free water flush decreased due to prior edema and need for Lasix.  Medications reviewed. Labs reviewed; CBGs: 120-168 mg/dL, BUN elevated, Ca: 8.3 mg/dL, Mg: 2.7 mg/dL.    9/1 - Patient is currently intubated on ventilator support - MV: 15.4 L/min - Propofol: 14.6 ml/hr (385 kcal). - Needs re-estimated based on current vent status and Tmax. OGT in place and pt currently receiving Oxepa @ 40 mL/hr with 20 ml free water Q4h which is providing 1825 kcal including Propofol kcal, 60 grams protein, and 874 mL free water which  is not meeting needs.  8/31 - Pt intubated with MV: 14.4 L/min and Propofol @ 11.6 mL/hr (306 kcal). - Wife reports pt ate small amount of breakfast 8/29 on Regular diet. - Appetite decreasing PTA, most notably over the past 10 days.  Diet Order:  Diet NPO time specified  Skin:  Reviewed, no issues  Last BM:  8/29  Height:   Ht Readings from Last 1 Encounters:  08/23/15 '6\' 1"'  (1.854 m)    Weight:   Wt Readings from Last 1 Encounters:  08/25/15 221 lb 5.5 oz (100.4 kg)    Ideal Body Weight:  83.64 kg (kg)  BMI:  Body mass index is 29.21 kg/(m^2).  Estimated Nutritional Needs:   Kcal:  2234  Protein:  120-140 grams  Fluid:  2 L/day  EDUCATION NEEDS:   No education needs identified at this time     Jarome Matin, RD, LDN Inpatient Clinical Dietitian Pager # [phone] After hours/weekend pager # [phone]

## 2015-08-25 NOTE — Progress Notes (Signed)
ANTIBIOTIC CONSULT NOTE - FOLLOW UP  Pharmacy Consult for Zosyn Indication: HCAP vs acute lung injury related to recent neulasta  No Known Allergies  Patient Measurements: Height: 6\' 1"  (185.4 cm) Weight: 221 lb 5.5 oz (100.4 kg) IBW/kg (Calculated) : 79.9 Adjusted Body Weight:   Vital Signs: Temp: 98.8 F (37.1 C) (09/02 0900) Temp Source: Core (Comment) (09/02 0400) BP: 127/55 mmHg (09/02 0900) Intake/Output from previous day: 09/01 0701 - 09/02 0700 In: 3106.3 [I.V.:1102.7; Blood:272; NG/GT:1531.6; IV Piggyback:150] Out: 3546 [FKCLE:7517] Intake/Output from this shift: Total I/O In: 136.7 [I.V.:31.7; NG/GT:55; IV Piggyback:50] Out: -   Labs:  Recent Labs  08/23/15 0350 08/24/15 0500 08/25/15 0430  WBC 12.2* 19.8* 17.8*  HGB 7.8* 7.9* 8.3*  PLT 330 393 355  CREATININE 0.72 0.96 0.86   Estimated Creatinine Clearance: 101 mL/min (by C-G formula based on Cr of 0.86). No results for input(s): VANCOTROUGH, VANCOPEAK, VANCORANDOM, GENTTROUGH, GENTPEAK, GENTRANDOM, TOBRATROUGH, TOBRAPEAK, TOBRARND, AMIKACINPEAK, AMIKACINTROU, AMIKACIN in the last 72 hours.   Microbiology: Recent Results (from the past 720 hour(s))  Surgical pcr screen     Status: None   Collection Time: 07/26/15 12:27 PM  Result Value Ref Range Status   MRSA, PCR NEGATIVE NEGATIVE Final   Staphylococcus aureus NEGATIVE NEGATIVE Final    Comment:        The Xpert SA Assay (FDA approved for NASAL specimens in patients over 53 years of age), is one component of a comprehensive surveillance program.  Test performance has been validated by [facility] for patients greater than or equal to 56 year old. It is not intended to diagnose infection nor to guide or monitor treatment.   Blood culture (routine x 2)     Status: None (Preliminary result)   Collection Time: 08/20/15 10:41 PM  Result Value Ref Range Status   Specimen Description BLOOD LEFT ANTECUBITAL  Final   Special Requests BOTTLES DRAWN  AEROBIC AND ANAEROBIC 5ML  Final   Culture   Final    NO GROWTH 3 DAYS Performed at [facility]    Report Status PENDING  Incomplete  Urine culture     Status: None   Collection Time: 08/21/15 12:59 AM  Result Value Ref Range Status   Specimen Description URINE, RANDOM  Final   Special Requests Immunocompromised  Final   Culture   Final    NO GROWTH 1 DAY Performed at [facility]    Report Status 08/22/2015 FINAL  Final  Culture, blood (routine x 2)     Status: None (Preliminary result)   Collection Time: 08/21/15  5:25 PM  Result Value Ref Range Status   Specimen Description BLOOD LEFT HAND  Final   Special Requests IN PEDIATRIC BOTTLE  4CC  Final   Culture   Final    NO GROWTH 3 DAYS Performed at [facility]    Report Status PENDING  Incomplete  Respiratory virus panel     Status: None   Collection Time: 08/21/15  5:26 PM  Result Value Ref Range Status   Source - RVPAN NOSE  Corrected   Respiratory Syncytial Virus A Negative Negative Final   Respiratory Syncytial Virus B Negative Negative Final   Influenza A Negative Negative Final   Influenza B Negative Negative Final   Parainfluenza 1 Negative Negative Final   Parainfluenza 2 Negative Negative Final   Parainfluenza 3 Negative Negative Final   Metapneumovirus Negative Negative Final   Rhinovirus Negative Negative Final   Adenovirus Negative  Negative Final    Comment: (NOTE) Performed At: [facility] [place], [place] [id number] Lindon Romp MD [id number]   Culture, blood (routine x 2)     Status: None (Preliminary result)   Collection Time: 08/21/15  5:35 PM  Result Value Ref Range Status   Specimen Description BLOOD LEFT HAND  Final   Special Requests BOTTLES DRAWN AEROBIC AND ANAEROBIC  6CC  Final   Culture   Final    NO GROWTH 3 DAYS Performed at [facility]    Report Status PENDING  Incomplete  Culture, bal-quantitative     Status: None  (Preliminary result)   Collection Time: 08/22/15 12:48 PM  Result Value Ref Range Status   Specimen Description BRONCHIAL ALVEOLAR LAVAGE  Final   Special Requests Immunocompromised  Final   Gram Stain   Final    RARE WBC PRESENT,BOTH PMN AND MONONUCLEAR NO SQUAMOUS EPITHELIAL CELLS SEEN NO ORGANISMS SEEN Performed at [facility]    Culture   Final    Non-Pathogenic Oropharyngeal-type Flora Isolated. Performed at [facility]    Report Status PENDING  Incomplete  Fungus Culture with Smear     Status: None (Preliminary result)   Collection Time: 08/22/15 12:48 PM  Result Value Ref Range Status   Specimen Description BRONCHIAL ALVEOLAR LAVAGE  Final   Special Requests Immunocompromised  Final   Fungal Smear   Final    NO YEAST OR FUNGAL ELEMENTS SEEN Performed at [facility]    Culture   Final    CULTURE IN PROGRESS FOR FOUR WEEKS Performed at [facility]    Report Status PENDING  Incomplete  Pneumocystis smear by DFA     Status: None   Collection Time: 08/22/15 12:49 PM  Result Value Ref Range Status   Specimen Source-PJSRC BRONCHIAL ALVEOLAR LAVAGE  Final   Pneumocystis jiroveci Ag NEGATIVE  Final    Comment: Performed at [place]    Start     Dose/Rate Route Frequency Ordered Stop   08/22/15 0200  sulfamethoxazole-trimethoprim (BACTRIM) 500 mg in dextrose 5 % 500 mL IVPB  Status:  Discontinued     500 mg 354.2 mL/hr over 90 Minutes Intravenous Every 8 hours 08/21/15 1652 08/23/15 1055   08/21/15 2300  vancomycin (VANCOCIN) IVPB 1000 mg/200 mL premix  Status:  Discontinued     1,000 mg 200 mL/hr over 60 Minutes Intravenous  Once 08/21/15 0153 08/21/15 0217   08/21/15 1700  sulfamethoxazole-trimethoprim (BACTRIM) 500 mg in dextrose 5 % 500 mL IVPB     500 mg 354.2 mL/hr over 90 Minutes Intravenous NOW 08/21/15 1652 08/21/15 1853   08/21/15 1200  vancomycin (VANCOCIN) 1,250 mg in sodium chloride  0.9 % 250 mL IVPB  Status:  Discontinued     1,250 mg 166.7 mL/hr over 90 Minutes Intravenous Every 12 hours 08/21/15 0222 08/21/15 1624   08/21/15 1100  azithromycin (ZITHROMAX) tablet 500 mg  Status:  Discontinued     500 mg Oral Daily 08/21/15 1052 08/23/15 0848   08/21/15 0800  piperacillin-tazobactam (ZOSYN) IVPB 3.375 g  Status:  Discontinued     3.375 g 100 mL/hr over 30 Minutes Intravenous 3 times per day 08/21/15 0153 08/21/15 0218   08/21/15 0800  piperacillin-tazobactam (ZOSYN) IVPB 3.375 g     3.375 g 12.5 mL/hr over 240 Minutes Intravenous Every 8 hours 08/21/15 0218     08/20/15 2345  vancomycin (VANCOCIN) IVPB 1000 mg/200 mL premix     1,000 mg 200 mL/hr over 60 Minutes Intravenous  Once 08/20/15 2338 08/21/15 0227   08/20/15 2345  piperacillin-tazobactam (ZOSYN) IVPB 4.5 g  Status:  Discontinued     4.5 g 200 mL/hr over 30 Minutes Intravenous  Once 08/20/15 2338 08/20/15 2341   08/20/15 2345  piperacillin-tazobactam (ZOSYN) IVPB 3.375 g     3.375 g 100 mL/hr over 30 Minutes Intravenous  Once 08/20/15 2341 08/21/15 0127      Assessment: 82 yoM with DLBCL on chemotherapy admitted 8/29 with worsening hypoxia and respiratory distress later that evening, now intubated and sedated in ICU.  Pt on D5 zosyn for possible HCAP vs neulasta induced ARDS/pneumonitis in setting of lung infection / lung injury from chemotherapy.    Anti-infectives 8/28 >>zosyn >> 8/29 >>vanc >> 8/29 8/29 >> azithromycin >> 8/31 8/30 >> bactrim >> 8/31  Vitals/Labs Temp: afebrile since 8/30 WBC: elevated (Neulasta, decadron), sl decrease today Renal: SCr stable wnl; CrCl >100 CG, 82 N  Cultures 8/28 blood: ngtd 8/29 blood x2: ngtd 8/29 urine: NGF 8/29 legionella and strep pneumoniae urine antigens: neg/neg 8/29 resp virus panel: negative 8/30 BAL pneumocystis smear: negative 8/30 BAL cx: oral flora 8/30 CMV studies: IgG+, IgM- 8/30 BAL fungus culture with smear: ngtd 8/31 repeat resp  virus panel: IP  Goal of Therapy:  Eradication of infection  Plan:  Continue Zosyn 3.375g IV q8h (infuse over 4 hours) F/u cultures, renal function, clinical course  Ralene Bathe, PharmD, BCPS 08/25/2015, 9:24 AM  Pager: [phone]

## 2015-08-25 NOTE — Telephone Encounter (Signed)
[facility] calling w/(+) lab result.  Call transferred to 2W @ [facility] where pt admitted.

## 2015-08-25 NOTE — Progress Notes (Signed)
Patient ID: Ethan Stewart, male   DOB: 04-01-1946, 69 y.o.   MRN: [id number]         Ethan Stewart for Infectious Disease    Date of Admission:  08/20/2015           Day 5 piperacillin tazobactam  Principal Problem:   HCAP (healthcare-associated pneumonia) Active Problems:   DLBCL (diffuse large B cell lymphoma)   Sepsis   Anemia associated with chemotherapy   Sinus tachycardia   GERD (gastroesophageal reflux disease)   Blood poisoning   Hypoxia   Acute on chronic respiratory failure with hypoxia   ARDS (adult respiratory distress syndrome)   . sodium chloride  250 mL Intravenous Once  . antiseptic oral rinse  7 mL Mouth Rinse QID  . chlorhexidine gluconate  15 mL Mouth Rinse BID  . dexamethasone  4 mg Intravenous 4 times per day  . enoxaparin (LOVENOX) injection  40 mg Subcutaneous Q24H  . feeding supplement (PRO-STAT SUGAR FREE 64)  30 mL Per Tube BID  . fentaNYL (SUBLIMAZE) injection  50 mcg Intravenous Once  . free water  50 mL Per Tube 4 times per day  . furosemide  40 mg Intravenous Q12H  . insulin aspart  0-9 Units Subcutaneous 6 times per day  . pantoprazole sodium  40 mg Per Tube Daily  . piperacillin-tazobactam (ZOSYN)  IV  3.375 g Intravenous Q8H  . sodium chloride  1,000 mL Intravenous Once  . sodium chloride  10-40 mL Intracatheter Q12H   Review of Systems: Review of systems not obtained due to patient factors.  Past Medical History  Diagnosis Date  . History of bleeding ulcers 1990's  . 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 Anxiety   . GERD (gastroesophageal reflux disease)   . H. pylori infection   . History of blood transfusion   . Bone cancer      tumor on C5  . Arm numbness 08/07/15    Limited movement due to tumor on spine  . Diffuse large B cell lymphoma     biposy 07/25/2105    Social History  Substance Use Topics  . Smoking status: Former Smoker -- 1.00 packs/day for 10 years    Types: Cigarettes    Quit date: 12/23/1981  . Smokeless  tobacco: Never Used  . Alcohol Use: 3.0 oz/week    2 Glasses of wine, 3 Cans of beer per week    Family History  Problem Relation Age of Onset  . Hypertension Mother    No Known Allergies  OBJECTIVE: Filed Vitals:   08/25/15 0700 08/25/15 0800 08/25/15 0900 08/25/15 1000  BP: 113/47 126/51 127/55 115/51  Pulse:      Temp: 99.1 F (37.3 C) 99 F (37.2 C) 98.8 F (37.1 C) 99 F (37.2 C)  TempSrc:      Resp: 27 16 20  32  Height:      Weight:      SpO2: 95% 90% 92% 94%   Body mass index is 29.21 kg/(m^2).  General: He remains on the ventilator. We will periodically open his eyes to voice. He is more alert. Skin: No rash Lungs: Clear anteriorly Cor: Regular S1 and S2 with no murmur Abdomen: Soft with quiet bowel sounds  Lab Results Lab Results  Component Value Date   WBC 17.8* 08/25/2015   HGB 8.3* 08/25/2015   HCT 24.7* 08/25/2015   MCV 87.9 08/25/2015   PLT 355 08/25/2015    Lab Results  Component Value Date  CREATININE 0.86 08/25/2015   BUN 31* 08/25/2015   NA 145 08/25/2015   K 4.5 08/25/2015   CL 110 08/25/2015   CO2 30 08/25/2015    Lab Results  Component Value Date   ALT 37 08/23/2015   AST 84* 08/23/2015   ALKPHOS 123 08/23/2015   BILITOT <0.1* 08/23/2015     Microbiology: Recent Results (from the past 240 hour(s))  Blood culture (routine x 2)     Status: None (Preliminary result)   Collection Time: 08/20/15 10:41 PM  Result Value Ref Range Status   Specimen Description BLOOD LEFT ANTECUBITAL  Final   Special Requests BOTTLES DRAWN AEROBIC AND ANAEROBIC 5ML  Final   Culture   Final    NO GROWTH 3 DAYS Performed at [facility]    Report Status PENDING  Incomplete  Urine culture     Status: None   Collection Time: 08/21/15 12:59 AM  Result Value Ref Range Status   Specimen Description URINE, RANDOM  Final   Special Requests Immunocompromised  Final   Culture   Final    NO GROWTH 1 DAY Performed at [facility]     Report Status 08/22/2015 FINAL  Final  Culture, blood (routine x 2)     Status: None (Preliminary result)   Collection Time: 08/21/15  5:25 PM  Result Value Ref Range Status   Specimen Description BLOOD LEFT HAND  Final   Special Requests IN PEDIATRIC BOTTLE  4CC  Final   Culture   Final    NO GROWTH 3 DAYS Performed at [facility]    Report Status PENDING  Incomplete  Respiratory virus panel     Status: None   Collection Time: 08/21/15  5:26 PM  Result Value Ref Range Status   Source - RVPAN NOSE  Corrected   Respiratory Syncytial Virus A Negative Negative Final   Respiratory Syncytial Virus B Negative Negative Final   Influenza A Negative Negative Final   Influenza B Negative Negative Final   Parainfluenza 1 Negative Negative Final   Parainfluenza 2 Negative Negative Final   Parainfluenza 3 Negative Negative Final   Metapneumovirus Negative Negative Final   Rhinovirus Negative Negative Final   Adenovirus Negative Negative Final    Comment: (NOTE) Performed At: [facility] [place], [place] [id number] Lindon Romp MD [id number]   Culture, blood (routine x 2)     Status: None (Preliminary result)   Collection Time: 08/21/15  5:35 PM  Result Value Ref Range Status   Specimen Description BLOOD LEFT HAND  Final   Special Requests BOTTLES DRAWN AEROBIC AND ANAEROBIC  6CC  Final   Culture   Final    NO GROWTH 3 DAYS Performed at [facility]    Report Status PENDING  Incomplete  Culture, bal-quantitative     Status: None (Preliminary result)   Collection Time: 08/22/15 12:48 PM  Result Value Ref Range Status   Specimen Description BRONCHIAL ALVEOLAR LAVAGE  Final   Special Requests Immunocompromised  Final   Gram Stain   Final    RARE WBC PRESENT,BOTH PMN AND MONONUCLEAR NO SQUAMOUS EPITHELIAL CELLS SEEN NO ORGANISMS SEEN Performed at [facility]    Culture   Final    Non-Pathogenic Oropharyngeal-type Flora  Isolated. Performed at [facility]    Report Status PENDING  Incomplete  Fungus Culture with Smear     Status: None (Preliminary result)   Collection Time: 08/22/15 12:48 PM  Result Value Ref Range Status   Specimen Description BRONCHIAL ALVEOLAR LAVAGE  Final   Special Requests Immunocompromised  Final   Fungal Smear   Final    NO YEAST OR FUNGAL ELEMENTS SEEN Performed at [facility]    Culture   Final    CULTURE IN PROGRESS FOR FOUR WEEKS Performed at [facility]    Report Status PENDING  Incomplete  Pneumocystis smear by DFA     Status: None   Collection Time: 08/22/15 12:49 PM  Result Value Ref Range Status   Specimen Source-PJSRC BRONCHIAL ALVEOLAR LAVAGE  Final   Pneumocystis jiroveci Ag NEGATIVE  Final    Comment: Performed at [place] - 1 VIEW 08/25/2015  COMPARISON: Portable chest x-ray of August 24, 2015  FINDINGS: The lungs are well-expanded. Confluent interstitial and alveolar opacities persist but are slightly less conspicuous today. The retrocardiac region remains dense with partial obscuration of the hemidiaphragm. The cardiac silhouette remains enlarged. The pulmonary vascularity is more distinct but still engorged.  The endotracheal tube tip lies 8.2 cm above the carina. The esophagogastric tube tip projects below the inferior margin of the image. The left PICC line tip projects in the mid SVC.  IMPRESSION: Slight interval improvement of bilateral interstitial and alveolar opacities which suggests improving pulmonary edema/ARDS and/or pneumonia.   Electronically Signed  By: David Martinique M.D.  On: 08/25/2015 07:28   ASSESSMENT: He is showing some radiographic improvement in his bilateral infiltrates. I would continue piperacillin tazobactam through the weekend for possible HCAP. I discussed this plan with his wife this morning.  PLAN: 1. Continue piperacillin tazobactam  through the weekend 2. Please call Dr. Tommy Medal [phone]) for any infectious disease questions over the holiday weekend  Ethan Bickers, MD [facility] for [place] [phone] pager   [phone] cell 08/25/2015, 10:44 AM

## 2015-08-25 NOTE — Progress Notes (Signed)
   08/25/15 1353  Clinical Encounter Type  Visited With Family  Visit Type Initial;Spiritual support;Social support  Referral From Other (Comment) (ICU stepdown systems list (triage))  Consult/Referral To None  Recommendations Intern will follow-up with pt and family regarding spiritual care needs  Spiritual Encounters  Spiritual Needs Emotional  Stress Factors  Patient Stress Factors Not reviewed  Family Stress Factors Exhausted;Health changes;Major life changes    Visited with pt's wife to process some anxiety surrounding the recent changes in her husband's healthcare needs. Pt's wife reported feeling somewhat anxious over the potential comfort of her husband as well as the growth of his tumor. Pt's wife reported she has had spiritual care support from minister on several occasions. Pt's wife additionally indicated she has familial support from her adult children (son and daughter). Pt's wife continued to refocus conversation on her husband and his care, reporting that she "had a bad day Wednesday, but is better today." Intern left a brochure for counseling services with pt's wife and gave instructions on how to contact her in the future. Intern will follow up with pt and pt's wife regarding any further emotional or spiritual care needs.   [place]

## 2015-08-25 NOTE — Progress Notes (Signed)
PULMONARY / CRITICAL CARE MEDICINE   Name: Ethan Stewart MRN: [id number] DOB: 06/02/1946    ADMISSION DATE:  08/20/2015 CONSULTATION DATE:  08/25/2015  REFERRING MD : Wynelle Cleveland  CHIEF COMPLAINT: SOB  INITIAL PRESENTATION: 69 y.o. M with diffuse large B cell lymphoma, [facility] ED 8/29 with HCAP. Developed worsening hypoxia and respiratory distress later that evening; therefore, PCCM called for further recs..    STUDIES:  CXR 8/29 >>> worsened central lung alveolar opacities bilaterally, suspicious for infectious infiltrates. CTA chest 8/29 >>> b/l airspace disease c/w atypical PNA or non-cardiogenic edema. No PE. CXR 9/2 >> sub cut emphysema RT neck  SIGNIFICANT EVENTS: 8/29 - admitted 8/30 intubated, ARDS protocol   HISTORY OF PRESENT ILLNESS:  Ethan Stewart is a 69 y.o. M with PMH as outlined below including recently diagnosed (August 2016) diffuse large B cell lymphoma stage IV with inguinal and mesenteric lymphadenopathy plus extranodal involvement of C5 vertebra s/p C5 corpectomy (07/26/15). He has had 1 cycle of R-CHOP with intrathecal methotrexate plus hydrocortisone for CNS prophylaxis (08/09/15). He was seen in oncology clinic 8/26 and was neutropenic for which he was placed on GCSF as well as neutropenic precautions.  He presented to the ED 8/29 with fevers, chills, cough, SOB x 2 days. He was found to have HCAP and was subsequently admitted for further management. He had been seen in ED 1 day prior on 8/27 with similar symptoms but was discharged home.  Later that evening, pt developed worsening SOB to the point it caused him respiratory distress. He was hypoxic despite NRB; therefore, was placed on BiPAP. PCCM was consulted for concerns of further decline / worsening respiratory failure.  Of note, he has residual right arm weakness from cord compression due to C5 tumor. He is under physical therapy treatments.   SUBJECTIVE:  Remains Critically ill, intubated  on ards protocol On 50%/+10 Afebrile Diuresed some with lasix but pos balance  VITAL SIGNS: Temp:  [98.1 F (36.7 C)-99.3 F (37.4 C)] 98.8 F (37.1 C) (09/02 0900) Resp:  [15-33] 20 (09/02 0900) BP: (95-127)/(42-56) 127/55 mmHg (09/02 0900) SpO2:  [85 %-99 %] 92 % (09/02 0900) FiO2 (%):  [50 %-100 %] 60 % (09/02 0839) Weight:  [221 lb 5.5 oz (100.4 kg)] 221 lb 5.5 oz (100.4 kg) (09/02 0126) HEMODYNAMICS: CVP:  [7 mmHg-13 mmHg] 7 mmHg VENTILATOR SETTINGS: Vent Mode:  [-] PRVC FiO2 (%):  [50 %-100 %] 60 % Set Rate:  [28 bmp] 28 bmp Vt Set:  [480 mL] 480 mL PEEP:  [8 cmH20-10 cmH20] 8 cmH20 Plateau Pressure:  [18 cmH20-22 cmH20] 18 cmH20 INTAKE / OUTPUT: Intake/Output      09/01 0701 - 09/02 0700 09/02 0701 - 09/03 0700   I.V. (mL/kg) 1102.7 (11) 31.7 (0.3)   Blood 272    Other 50    NG/GT 1531.6 55   IV Piggyback 150 50   Total Intake(mL/kg) 3106.3 (30.9) 136.7 (1.4)   Urine (mL/kg/hr) 1870 (0.8)    Emesis/NG output     Total Output 1870     Net +1236.3 +136.7          PHYSICAL EXAMINATION: General: Male of normal body habitus intubated Neuro: RASS-1, chronic RUE weakness -able to grip on WUA HEENT: [place]/AT, no JVD, PERRL  Cardiovascular: RRR, no MRG Lungs: resps even, unlabored,Coarse breath sounds Abdomen: Soft, non-tender, non-distended Musculoskeletal: No acute deformity Skin: Grossly intact  LABS:  CBC  Recent Labs Lab 08/23/15 0350 08/24/15 0500 08/25/15 0430  WBC 12.2* 19.8* 17.8*  HGB 7.8* 7.9* 8.3*  HCT 22.8* 23.9* 24.7*  PLT 330 393 355   Coag's  Recent Labs Lab 08/21/15 0316  APTT 54*  INR 1.36   BMET  Recent Labs Lab 08/23/15 0350 08/24/15 0500 08/25/15 0430  NA 137 140 145  K 3.5 4.3 4.5  CL 107 108 110  CO2 23 25 30   BUN 14 19 31*  CREATININE 0.72 0.96 0.86  GLUCOSE 154* 149* 128*   Electrolytes  Recent Labs Lab 08/22/15 0320 08/23/15 0350 08/24/15 0500 08/25/15 0430  CALCIUM 7.9* 7.7* 8.1* 8.3*  MG 2.1 2.3  --   2.7*  PHOS 3.3 3.8  --  3.3   Sepsis Markers  Recent Labs Lab 08/21/15 0316 08/21/15 0615 08/21/15 1300  LATICACIDVEN 1.6 1.2 1.6  PROCALCITON 1.98  --   --    ABG  Recent Labs Lab 08/22/15 1300 08/23/15 0400 08/24/15 0315  PHART 7.357 7.393 7.319*  PCO2ART 42.1 36.2 47.8*  PO2ART 81.7 74.1* 66.6*   Liver Enzymes  Recent Labs Lab 08/19/15 1015 08/20/15 2241 08/23/15 0350  AST 48* 67* 84*  ALT 35 33 37  ALKPHOS 118 135* 123  BILITOT 0.6 0.2* <0.1*  ALBUMIN 2.5* 2.4* 1.7*   Cardiac Enzymes  Recent Labs Lab 08/21/15 1725 08/21/15 2205 08/22/15 0320  TROPONINI 0.05* 0.03 0.03   Glucose  Recent Labs Lab 08/24/15 1131 08/24/15 1536 08/24/15 1929 08/24/15 2348 08/25/15 0413 08/25/15 0738  GLUCAP 132* 140* 140* 137* 120* 140*    Imaging Dg Chest Port 1 View  08/25/2015   CLINICAL DATA:  Respiratory failure intubated patient  EXAM: PORTABLE CHEST - 1 VIEW  COMPARISON:  Portable chest x-ray of August 24, 2015  FINDINGS: The lungs are well-expanded. Confluent interstitial and alveolar opacities persist but are slightly less conspicuous today. The retrocardiac region remains dense with partial obscuration of the hemidiaphragm. The cardiac silhouette remains enlarged. The pulmonary vascularity is more distinct but still engorged.  The endotracheal tube tip lies 8.2 cm above the carina. The esophagogastric tube tip projects below the inferior margin of the image. The left PICC line tip projects in the mid SVC.  IMPRESSION: Slight interval improvement of bilateral interstitial and alveolar opacities which suggests improving pulmonary edema/ARDS and/or pneumonia.   Electronically Signed   By: David  Martinique M.D.   On: 08/25/2015 07:28     ASSESSMENT / PLAN:  PULMONARY A: ETT 8/30 >> ARDS HCAP- cx neg. DD incl G-CSF related pneumonitis Barotrauma - sub cut emphysema 9/2 P:  ARDS protocol - down to 6cc/kg = 480. PEEP 10- dial down  as tolerated in v/o  barotrauma - avoid asynchrony with deeper sedation if needed Continue steroids, bronchodilators.   CARDIOVASCULAR A:  Mild troponin leak - suspect demand ischemia. Hypotension - due to PEEP/sedatives P:  Aim for CVP 4-8 range, give lasix 40 q 12h Off  levo gtt  RENAL A:  Pseudohypocalcemia -corrects for alb Hypokalemia P:  Dc IVFs Replete lytes as needed  GASTROINTESTINAL A:  GERD. Protein calorie malnutrition. P:  Pantoprazole. Ct TFs @ goal  HEMATOLOGIC / ONCOLOGIC A:  Diffuse large B cell lymphoma (follows with Dr. Vivien Rossetti) s/p 1 cycle of R-CHOP Anemia. VTE Prophylaxis. P:  Dr. Vivien Rossetti with Heme / Onc following. Transfuse for Hb 7 or lower SCD's / Lovenox.  INFECTIOUS A:  HCAP / ? Atypical PNA.  P:  BCx2 8/29 > ng UCx 8/29 >ng Sputum Cx 8/29 > PCP stain 8/29 >  neg U. Legionella 8/29 >neg U. Strep 8/29 >neg RVP 8/29 > nasal swab >> neg RVP 8/30 BAL >> Abx: Zosyn, start date 8/29 >> Abx: Azithro, start date 8/29 >>9/2 Abx: Bactrim, start date 8/29 >> 8/31 Vanc 8/29 >>9/1  PCT algorithm not helpful given his immunocompromised state, zosyn x 7-10 ds . Doubt need for gancyclovir  ENDOCRINE A:  At risk for steroid induced hyperglycemia.  P:  SSI -sensitive  NEUROLOGIC A:  Anxiety on valium Hx C5 tumor - s/p C5 corpectomy with C4-C6 arthrodesis and anterior cervical plating of C4-C6 (Dr. Ronnald Ramp - 07/26/15). P:ct  fent gtt goal RASS -1, use versed after 9/2 instead of propofol  Confused with ativan - resume  low dose valium for anxiety (home med)   Code Status: Full.  Family updated: 9/1 wife updated daily  Interdisciplinary Family Meeting v Palliative Care Meeting: Due by: 9/5   Summary - ARDS , mild improvement in hypoxia, concern for barotrauma -prognosis remains guarded but optimistic given 1 organ failure  The patient is critically ill with multiple organ systems failure and requires high complexity decision  making for assessment and support, frequent evaluation and titration of therapies, application of advanced monitoring technologies and extensive interpretation of multiple databases. Critical Care Time devoted to patient care services described in this note independent of APP time is 35 minutes.    Kara Mead MD. [place]. [place] [facility] Pager [phone] If no response call [phone]    08/25/2015 9:23 AM

## 2015-08-26 ENCOUNTER — Inpatient Hospital Stay (HOSPITAL_COMMUNITY): Payer: Non-veteran care

## 2015-08-26 DIAGNOSIS — J9600 Acute respiratory failure, unspecified whether with hypoxia or hypercapnia: Secondary | ICD-10-CM | POA: Insufficient documentation

## 2015-08-26 LAB — GLUCOSE, CAPILLARY
GLUCOSE-CAPILLARY: 119 mg/dL — AB (ref 65–99)
GLUCOSE-CAPILLARY: 132 mg/dL — AB (ref 65–99)
GLUCOSE-CAPILLARY: 141 mg/dL — AB (ref 65–99)
GLUCOSE-CAPILLARY: 152 mg/dL — AB (ref 65–99)
Glucose-Capillary: 112 mg/dL — ABNORMAL HIGH (ref 65–99)
Glucose-Capillary: 138 mg/dL — ABNORMAL HIGH (ref 65–99)

## 2015-08-26 LAB — BASIC METABOLIC PANEL
Anion gap: 7 (ref 5–15)
BUN: 39 mg/dL — AB (ref 6–20)
CALCIUM: 8.2 mg/dL — AB (ref 8.9–10.3)
CO2: 33 mmol/L — AB (ref 22–32)
CREATININE: 0.78 mg/dL (ref 0.61–1.24)
Chloride: 102 mmol/L (ref 101–111)
GFR calc non Af Amer: >60 mL/min (ref >60)
GLUCOSE: 150 mg/dL — AB (ref 65–99)
Potassium: 4.2 mmol/L (ref 3.5–5.1)
Sodium: 142 mmol/L (ref 135–145)

## 2015-08-26 LAB — CULTURE, BLOOD (ROUTINE X 2)
CULTURE: NO GROWTH
Culture: NO GROWTH
Culture: NO GROWTH

## 2015-08-26 LAB — CBC
HEMATOCRIT: 26 % — AB (ref 39.0–52.0)
Hemoglobin: 8.4 g/dL — ABNORMAL LOW (ref 13.0–17.0)
MCH: 28.8 pg (ref 26.0–34.0)
MCHC: 32.3 g/dL (ref 30.0–36.0)
MCV: 89 fL (ref 78.0–100.0)
Platelets: 371 10*3/uL (ref 150–400)
RBC: 2.92 MIL/uL — ABNORMAL LOW (ref 4.22–5.81)
RDW: 17.7 % — AB (ref 11.5–15.5)
WBC: 19.3 10*3/uL — ABNORMAL HIGH (ref 4.0–10.5)

## 2015-08-26 NOTE — Progress Notes (Signed)
PULMONARY / CRITICAL CARE MEDICINE   Name: Ethan Stewart MRN: [id number] DOB: 1946/06/02    ADMISSION DATE:  08/20/2015 CONSULTATION DATE:  08/26/2015  REFERRING MD : Wynelle Cleveland  CHIEF COMPLAINT: SOB  INITIAL PRESENTATION: 69 y.o. M with diffuse large B cell lymphoma, brought to [facility] ED 8/29 with HCAP. Developed worsening hypoxia and respiratory distress later that evening; therefore, PCCM called for further recs..    STUDIES:  CXR 8/29 >>> worsened central lung alveolar opacities bilaterally, suspicious for infectious infiltrates. CTA chest 8/29 >>> b/l airspace disease c/w atypical PNA or non-cardiogenic edema. No PE. CXR 9/2 >> sub cut emphysema RT neck  SIGNIFICANT EVENTS: 8/29 - admitted 8/30 intubated, ARDS protocol 9/3- off pressors, diuresis neg 2.2 liters  SUBJECTIVE:  Agitation    VITAL SIGNS: Temp:  [98.1 F (36.7 C)-100 F (37.8 C)] 99.1 F (37.3 C) (09/03 0800) Pulse Rate:  [105] 105 (09/03 0744) Resp:  [12-49] 18 (09/03 0800) BP: (108-181)/(46-91) 181/91 mmHg (09/03 0800) SpO2:  [88 %-97 %] 89 % (09/03 0800) FiO2 (%):  [50 %-60 %] 50 % (09/03 0800) Weight:  [97.9 kg (215 lb 13.3 oz)] 97.9 kg (215 lb 13.3 oz) (09/03 0500) HEMODYNAMICS: CVP:  [6 mmHg-11 mmHg] 11 mmHg VENTILATOR SETTINGS: Vent Mode:  [-] PSV;CPAP FiO2 (%):  [50 %-60 %] 50 % Set Rate:  [28 bmp] 28 bmp Vt Set:  [480 mL] 480 mL PEEP:  [8 cmH20] 8 cmH20 Pressure Support:  [5 cmH20] 5 cmH20 Plateau Pressure:  [10 cmH20-19 cmH20] 10 cmH20 INTAKE / OUTPUT: Intake/Output      09/02 0701 - 09/03 0700 09/03 0701 - 09/04 0700   I.V. (mL/kg) 937.2 (9.6) 30.8 (0.3)   Blood     Other     NG/GT 1674.6 60   IV Piggyback 150 25   Total Intake(mL/kg) 2761.8 (28.2) 115.8 (1.2)   Urine (mL/kg/hr) 4825 (2.1) 150 (1.3)   Total Output 4825 150   Net -2063.3 -34.2          PHYSICAL EXAMINATION: General: Male of normal body habitus intubated Neuro: RASS 0 , chronic RUE weakness HEENT: [place]/AT, no JVD,  PERRL  Cardiovascular: RRR, no MRG Lungs: scattered ronchi , reduced bases Abdomen: Soft, non-tender, non-distended Musculoskeletal: No acute deformity Skin: Grossly intact  LABS:  CBC  Recent Labs Lab 08/24/15 0500 08/25/15 0430 08/26/15 0450  WBC 19.8* 17.8* 19.3*  HGB 7.9* 8.3* 8.4*  HCT 23.9* 24.7* 26.0*  PLT 393 355 371   Coag's  Recent Labs Lab 08/21/15 0316  APTT 54*  INR 1.36   BMET  Recent Labs Lab 08/24/15 0500 08/25/15 0430 08/26/15 0450  NA 140 145 142  K 4.3 4.5 4.2  CL 108 110 102  CO2 25 30 33*  BUN 19 31* 39*  CREATININE 0.96 0.86 0.78  GLUCOSE 149* 128* 150*   Electrolytes  Recent Labs Lab 08/22/15 0320 08/23/15 0350 08/24/15 0500 08/25/15 0430 08/26/15 0450  CALCIUM 7.9* 7.7* 8.1* 8.3* 8.2*  MG 2.1 2.3  --  2.7*  --   PHOS 3.3 3.8  --  3.3  --    Sepsis Markers  Recent Labs Lab 08/21/15 0316 08/21/15 0615 08/21/15 1300  LATICACIDVEN 1.6 1.2 1.6  PROCALCITON 1.98  --   --    ABG  Recent Labs Lab 08/22/15 1300 08/23/15 0400 08/24/15 0315  PHART 7.357 7.393 7.319*  PCO2ART 42.1 36.2 47.8*  PO2ART 81.7 74.1* 66.6*   Liver Enzymes  Recent Labs Lab 08/19/15 1015  08/20/15 2241 08/23/15 0350  AST 48* 67* 84*  ALT 35 33 37  ALKPHOS 118 135* 123  BILITOT 0.6 0.2* <0.1*  ALBUMIN 2.5* 2.4* 1.7*   Cardiac Enzymes  Recent Labs Lab 08/21/15 1725 08/21/15 2205 08/22/15 0320  TROPONINI 0.05* 0.03 0.03   Glucose  Recent Labs Lab 08/25/15 0738 08/25/15 1201 08/25/15 1536 08/25/15 1928 08/25/15 2349 08/26/15 0343  GLUCAP 140* 141* 142* 140* 132* 141*    Imaging No results found.   ASSESSMENT / PLAN:  PULMONARY A: ETT 8/30 >> ARDS HCAP- cx neg. DD incl G-CSF related pneumonitis Barotrauma - sub cut emphysema 9/2 P:  ARDS protocol - down to 6cc/kg = 480. 50% peep 8, rate 28, weaning now CPAP 8 PS 5-10, likely to fail, his baseline MV is still high Repeat abg in am on rest, to assess MV needs  further pcxr again in am  All CT reviewed Continue steroids- remain given etiology If bronchodilators are not home med dc, as may worsen ards  CARDIOVASCULAR A:  Mild troponin leak - suspect demand ischemia. Hypotension - due to PEEP/sedatives P:  Aim for CVP 4- factt lasix tele  RENAL A:  Pseudohypocalcemia -corrects for alb ARDS P:  Dc IVFs Lasix to maintain to same goals cvp goal 4 Dc free water  GASTROINTESTINAL A:  GERD. Protein calorie malnutrition. P:  Pantoprazole. Ct TFs @ goal Consider oxepa  HEMATOLOGIC / ONCOLOGIC A:  Diffuse large B cell lymphoma (follows with Dr. Vivien Rossetti) s/p 1 cycle of R-CHOP Anemia. VTE Prophylaxis. P:  Transfuse for Hb 7 or lower SCD's / Lovenox.  INFECTIOUS A:  HCAP / ? Atypical PNA.  P:  BCx2 8/29 > ng UCx 8/29 >ng Sputum Cx 8/29 > PCP stain 8/29 >neg U. Legionella 8/29 >neg U. Strep 8/29 >neg RVP 8/29 > nasal swab >> neg RVP 8/30 BAL >> Abx: Zosyn, start date 8/29 >> Abx: Azithro, start date 8/29 >>9/2 Abx: Bactrim, start date 8/29 >> 8/31 Vanc 8/29 >>9/1   zosyn per ID  ENDOCRINE A:  At risk for steroid induced hyperglycemia.  P:  SSI -sensitive  NEUROLOGIC A:  Anxiety on valium Hx C5 tumor - s/p C5 corpectomy with C4-C6 arthrodesis and anterior cervical plating of C4-C6 (Dr. Ronnald Ramp - 07/26/15). P: fent / prop WUA ,mandatory PT on vent, walk as goal   Code Status: Full.  Family updated: 9/1 wife updated daily  Interdisciplinary Family Meeting v Palliative Care Meeting: Due by: 9/5 Ccm time 30 min   Lavon Paganini. Titus Mould, MD, [place] Pgr: [facility] [facility]

## 2015-08-26 NOTE — Progress Notes (Signed)
Patient wean discontinued per increased work of breathing and O2 desaturation. Patient was returned to previous mode PRVC 480/28/50%/+8. Patient is comfortable at this time. RT will continue to monitor.

## 2015-08-26 NOTE — Progress Notes (Signed)
Called Dr. Titus Mould to ask if he wanted the patient to have a C difficile test run since the patient has had three stools.  Dr. Titus Mould elected not to run the C diff test, and d/c'd the Pro stat currently.  Continue to monitor the patient closely.  [place] Troia Roselie Awkward RN

## 2015-08-26 NOTE — Progress Notes (Signed)
PT Cancellation Note  Patient Details Name: [name] MRN: [id number] DOB: 04/02/1946   Cancelled Treatment:    Reason Eval/Treat Not Completed: Medical issues which prohibited therapy;Patient not medically ready (failed weaning from vent this AM. check back 9/4.)   Claretha Cooper 08/26/2015, 12:52 PM Tresa Endo PT [phone]

## 2015-08-27 ENCOUNTER — Inpatient Hospital Stay (HOSPITAL_COMMUNITY): Payer: Non-veteran care

## 2015-08-27 LAB — CBC WITH DIFFERENTIAL/PLATELET
BASOS ABS: 0 10*3/uL (ref 0.0–0.1)
Basophils Relative: 0 % (ref 0–1)
EOS ABS: 0 10*3/uL (ref 0.0–0.7)
Eosinophils Relative: 0 % (ref 0–5)
HCT: 27 % — ABNORMAL LOW (ref 39.0–52.0)
Hemoglobin: 8.7 g/dL — ABNORMAL LOW (ref 13.0–17.0)
LYMPHS PCT: 4 % — AB (ref 12–46)
Lymphs Abs: 0.7 10*3/uL (ref 0.7–4.0)
MCH: 29.1 pg (ref 26.0–34.0)
MCHC: 32.2 g/dL (ref 30.0–36.0)
MCV: 90.3 fL (ref 78.0–100.0)
Monocytes Absolute: 1.1 10*3/uL — ABNORMAL HIGH (ref 0.1–1.0)
Monocytes Relative: 6 % (ref 3–12)
NEUTROS PCT: 90 % — AB (ref 43–77)
Neutro Abs: 16.7 10*3/uL — ABNORMAL HIGH (ref 1.7–7.7)
PLATELETS: 399 10*3/uL (ref 150–400)
RBC: 2.99 MIL/uL — ABNORMAL LOW (ref 4.22–5.81)
RDW: 17.8 % — ABNORMAL HIGH (ref 11.5–15.5)
WBC: 18.5 10*3/uL — ABNORMAL HIGH (ref 4.0–10.5)

## 2015-08-27 LAB — GLUCOSE, CAPILLARY
GLUCOSE-CAPILLARY: 145 mg/dL — AB (ref 65–99)
GLUCOSE-CAPILLARY: 134 mg/dL — AB (ref 65–99)
Glucose-Capillary: 139 mg/dL — ABNORMAL HIGH (ref 65–99)
Glucose-Capillary: 123 mg/dL — ABNORMAL HIGH (ref 65–99)
Glucose-Capillary: 128 mg/dL — ABNORMAL HIGH (ref 65–99)

## 2015-08-27 LAB — COMPREHENSIVE METABOLIC PANEL
ALT: 30 U/L (ref 17–63)
AST: 53 U/L — AB (ref 15–41)
Albumin: 2.1 g/dL — ABNORMAL LOW (ref 3.5–5.0)
Alkaline Phosphatase: 98 U/L (ref 38–126)
Anion gap: 7 (ref 5–15)
BUN: 43 mg/dL — AB (ref 6–20)
CHLORIDE: 103 mmol/L (ref 101–111)
CO2: 37 mmol/L — AB (ref 22–32)
CREATININE: 0.78 mg/dL (ref 0.61–1.24)
Calcium: 8.3 mg/dL — ABNORMAL LOW (ref 8.9–10.3)
GFR calc non Af Amer: >60 mL/min (ref >60)
Glucose, Bld: 136 mg/dL — ABNORMAL HIGH (ref 65–99)
Potassium: 4.1 mmol/L (ref 3.5–5.1)
SODIUM: 147 mmol/L — AB (ref 135–145)
Total Bilirubin: 0.7 mg/dL (ref 0.3–1.2)
Total Protein: 5.1 g/dL — ABNORMAL LOW (ref 6.5–8.1)

## 2015-08-27 NOTE — Progress Notes (Signed)
PT Cancellation Note  Patient Details Name: Ethan Stewart MRN: [id number] DOB: 05/05/1946   Cancelled Treatment:    Reason Eval/Treat Not Completed: Patient not medically ready , remains on vent   Claretha Cooper 08/27/2015, 7:49 AM Tresa Endo PT [phone]

## 2015-08-27 NOTE — Progress Notes (Signed)
[name] Kitchen   HEMATOLOGY/ONCOLOGY INPATIENT PROGRESS NOTE  Date of Service: 08/27/2015  Inpatient Attending: .Rigoberto Noel, MD   SUBJECTIVE  Ethan Stewart was seen this afternoon  He remains intubated, sedated and on ARDS ventilator protocol. CXR appears to be somewhat improved. FiO2 requirement down to 40%. No other acute events. Some loose stools c diff testing has been ordered. I had discussed his situation in details with his wife on Friday.   OBJECTIVE:   PHYSICAL EXAMINATION: . Filed Vitals:   08/27/15 1100 08/27/15 1149 08/27/15 1200 08/27/15 1300  BP: 120/55 120/55 116/51 149/58  Pulse:  73    Temp: 99.9 F (37.7 C)  100.2 F (37.9 C) 100.2 F (37.9 C)  TempSrc:      Resp: _0 Height:      Weight:      SpO2: 93% 95% 95% 91%   Filed Weights   08/25/15 0126 08/26/15 0500 08/27/15 0437  Weight: 221 lb 5.5 oz (100.4 kg) 215 lb 13.3 oz (97.9 kg) 209 lb 10.5 oz (95.1 kg)   .Body mass index is 27.67 kg/(m^2).  GENERAL:alert, intubated on a ventilator, sedated. SKIN: no acute rashes EYES: closed. OROPHARYNX: ETT in situ NECK: no JVD, LYMPH:  no palpable lymphadenopathy in the cervical, axillary LUNGS: few b/l basal rales HEART: regular rate & rhythm,  no murmurs and bilateral lower extremity 2+ edema. (improved with diuresis) ABDOMEN: abdomen soft, non-tender, normoactive bowel sounds  NEURO: sedated.  MEDICAL HISTORY:  Past Medical History  Diagnosis Date  . History of bleeding ulcers 1990's  . 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 Anxiety   . GERD (gastroesophageal reflux disease)   . H. pylori infection   . History of blood transfusion   . Bone cancer      tumor on C5  . Arm numbness 08/07/15    Limited movement due to tumor on spine  . Diffuse large B cell lymphoma     biposy 07/25/2105   SURGICAL HISTORY: Past Surgical History  Procedure Laterality Date  . Hernia repair Bilateral     with mesh  . Colonoscopy    . Eye surgery Right     catheter  .  Anterior cervical corpectomy N/A 07/26/2015    Procedure: Cervical five Corpectomy/Cervical four-six Fusion/Plate ;  Surgeon: Eustace Moore, MD;  Location: [facility];  Service: Neurosurgery;  Laterality: N/A;  C5 Corpectomy/C4-6 Fusion/Plate C4-6    SOCIAL HISTORY: Social History   Social History  . Marital Status: Married    Spouse Name: N/A  . Number of Children: N/A  . Years of Education: N/A   Occupational History  . Not on file.   Social History Main Topics  . Smoking status: Former Smoker -- 1.00 packs/day for 10 years    Types: Cigarettes    Quit date: 12/23/1981  . Smokeless tobacco: Never Used  . Alcohol Use: 3.0 oz/week    2 Glasses of wine, 3 Cans of beer per week  . Drug Use: No  . Sexual Activity: Yes   Other Topics Concern  . Not on file   Social History Narrative    FAMILY HISTORY: Family History  Problem Relation Age of Onset  . Hypertension Mother     ALLERGIES:  has No Known Allergies.  MEDICATIONS:  Scheduled Meds: . sodium chloride  250 mL Intravenous Once  . antiseptic oral rinse  7 mL Mouth Rinse QID  . chlorhexidine gluconate  15 mL Mouth Rinse BID  .  dexamethasone  4 mg Intravenous 4 times per day  . enoxaparin (LOVENOX) injection  40 mg Subcutaneous Q24H  . fentaNYL (SUBLIMAZE) injection  50 mcg Intravenous Once  . furosemide  40 mg Intravenous Q12H  . insulin aspart  0-9 Units Subcutaneous 6 times per day  . pantoprazole sodium  40 mg Per Tube Daily  . piperacillin-tazobactam (ZOSYN)  IV  3.375 g Intravenous Q8H  . sodium chloride  1,000 mL Intravenous Once  . sodium chloride  10-40 mL Intracatheter Q12H   Continuous Infusions: . feeding supplement (VITAL AF 1.2 CAL) 1,000 mL (08/27/15 0600)  . fentaNYL infusion INTRAVENOUS 300 mcg/hr (08/27/15 1000)  . propofol (DIPRIVAN) infusion 20 mcg/kg/min (08/27/15 1000)   PRN Meds:.acetaminophen **OR** acetaminophen, diazepam, fentaNYL, heparin lock flush, heparin lock flush, midazolam,  neomycin-bacitracin-polymyxin, [DISCONTINUED] ondansetron **OR** ondansetron (ZOFRAN) IV, sodium chloride, sodium chloride, sodium chloride  REVIEW OF SYSTEMS:    10 Point review of Systems was done is negative except as noted above.   LABORATORY DATA:  I have reviewed the data as listed  . CBC Latest Ref Rng 08/27/2015 08/26/2015 08/25/2015  WBC 4.0 - 10.5 K/uL 18.5(H) 19.3(H) 17.8(H)  Hemoglobin 13.0 - 17.0 g/dL 8.7(L) 8.4(L) 8.3(L)  Hematocrit 39.0 - 52.0 % 27.0(L) 26.0(L) 24.7(L)  Platelets 150 - 400 K/uL 399 371 355    . CMP Latest Ref Rng 08/27/2015 08/26/2015 08/25/2015  Glucose 65 - 99 mg/dL 136(H) 150(H) 128(H)  BUN 6 - 20 mg/dL 43(H) 39(H) 31(H)  Creatinine 0.61 - 1.24 mg/dL 0.78 0.78 0.86  Sodium 135 - 145 mmol/L 147(H) 142 145  Potassium 3.5 - 5.1 mmol/L 4.1 4.2 4.5  Chloride 101 - 111 mmol/L 103 102 110  CO2 22 - 32 mmol/L 37(H) 33(H) 30  Calcium 8.9 - 10.3 mg/dL 8.3(L) 8.2(L) 8.3(L)  Total Protein 6.5 - 8.1 g/dL 5.1(L) - -  Total Bilirubin 0.3 - 1.2 mg/dL 0.7 - -  Alkaline Phos 38 - 126 U/L 98 - -  AST 15 - 41 U/L 53(H) - -  ALT 17 - 63 U/L 30 - -   Sedimentation rate 100 down from 125 CRP 19.3 down from 45.5  CMV IgG positive CMV IgM negative CMV DNA by PCR pending   Bronchoalveolar lavage RareWBC present both PMN and mononuclear. No squamous epithelial cells. No organisms seen.  Pneumocystis smear negative  Fungal cultures note yeast or fungal elements noted.         RADIOGRAPHIC STUDIES:ASSESSMENT & PLAN:   #1  Diffuse large B-cell lymphoma stage IV AE with involvement of mesenteric, inguinal lymph nodes and C5 vertebral body with spinal cord impingement.  He had a C5 corpectomy on 07/26/2015 that showed diffuse large B-cell lymphoma with a Ki-67 ranging from 20 to 90%.    DLBCL (diffuse large B cell lymphoma)   06/21/2015 Imaging CT Chest/abd/pelvis: IMPRESSION: 5 mm nodule within the right lower lobe as described.  Pneumobilia consistent with a  prior sphincterotomy.  No acute abnormality is identified. No findings to suggest chest etiology for the known lesion at C5 are seen.   06/21/2015 Imaging MRI c spine1. Diffuse marrow replacement of the C5 vertebral body highly concerning for neoplasm (metastatic disease, myeloma, or lymphoma). Epidural tumor at this level results in moderate spinal stenosis with moderate impression on the spinal cord    07/10/2015 PET scan 1. The previously demonstrated abnormal C5 vertebral body is hypermetabolic. No other osseous lesions demonstrated. 2. There are small hypermetabolic lymph nodes within the left mesentery  and both inguinal regions.    07/26/2015 Initial Biopsy Had a C5 corpectomy pathology showed diffuse large B-cell lymphoma   08/08/2015 -  Chemotherapy Started for cycle of R CHOP.    08/09/2015 -  Chemotherapy Received first cycle of intrathecal methotrexate plus hydrocortisone for CNS prophylaxis.   He is currently s/p 1 st cycle of R-CHOP on 08/08/2015 and was scheduled for 2nd cycle of R-CHOP on 08/31/2015.   We might need to dose adjust his chemotherapy once he is better to reduce the risk of neutropenic complications since be might be hard pressed to not use any G-CSF.  He will likely need Acute inpatient rehabilitation once extubated. We will need to determine the timing and intensity of his chemotherapy based on his clinical course from this point on. Considering that he is being treated with curative intent would try to avoid >1week delay in next cycle of chemotherapy if possible.  #2 ARDS with fevers and bilateral groundglass pulmonary opacities. Noncardiogenic pulmonary edema normal BNP. Mild troponin leak due to demand ischemia which normalized.  Patient was briefly neutropenic but has responded very well to Neulasta. Somewhat elevated pro-calcitonin levels did not allow for ruling out sepsis.  Overall picture appears increasingly more likely related to Neulasta. Neulasta itself would  increase the risk of ARDS like picture/pneumonitis in the setting of a lung infection/lung injury from chemotherapy and this typically presents with recovering WBC counts as with Ethan Stewart. His Neulasta should soon be out of his system typically lasts for 12-14 days after administration on 8/19.  Blood cultures negative thus far Viral respiratory panel negative Bronchoalveolar lavage with no overt organisms seen on Gram stain. PCP negative. CMV IgG positive IgM negative PCR pending. Elevated sedimentation rate and CRP now showing improvement which is a good sign.  Urine testing negative for Legionella antigen and Streptococcus pneumoniae.  #3 hypoxic respiratory failure due to #2 slowly improving CXR. FiO2 requirement now down to 40%  #4 Anemia related to chemotherapy and lymphoma as well as possible sepsis. Plan -Continues on IV Zosyn as per ID -We will have to avoid using any G-CSF in the future. -Appreciate care by the pulmonary and critical care team - on ARDS protocol mechanical ventilation down to 40% FiO2 requirement. -CC team working on fluid status, nutrition. - will rpt sed rate/crp and procalcitonin in the AM and consider reducing dexamethasone to 4 mg IV every 8 h tomorrow if improving trend. -c diff being check for loose stools --Continue close monitoring. -only irradiated blood products for transfusion. -will continue to follow up daily.  Appreciate excellent critical care/ pulmonary care, hospitalist and ID input.   The total time spent  was 25 minutes and more than 50% was on counseling and direct patient cares.    Sullivan Lone MD [place] AAHIVMS [facility] Hematology/Oncology Physician [facility]  (Office):       [phone] (Work cell):  [phone] (Fax):           [phone]  08/27/2015 1:23 PM

## 2015-08-27 NOTE — Progress Notes (Signed)
eLink Physician-Brief Progress Note Patient Name: Ethan Stewart DOB: [birth year] MRN: [id number]   Date of Service  08/27/2015  HPI/Events of Note  satn 96% on ARDS protocol with barotrauma -on PEEP 8  eICU Interventions  Drop PEEP to 5     Intervention Category Major Interventions: Respiratory failure - evaluation and management  Violanda Bobeck V. 08/27/2015, 12:03 AM

## 2015-08-27 NOTE — Progress Notes (Signed)
Nutrition Follow-up  INTERVENTION:   Continue Vital AF 1.2 @ 60 mL/hr with 50 mL free water Q6h.   Tube feeding regimen + current Propofol infusion provides 2037 kcal (94% of needs), 108 grams of protein, and 1368 ml of H2O.   If able, add 30 ml Prostat daily.  RD to continue to monitor  NUTRITION DIAGNOSIS:   Increased nutrient needs related to catabolic illness, cancer and cancer related treatments as evidenced by estimated needs.  Ongoing.  GOAL:   Patient will meet greater than or equal to 90% of their needs  Meeting.  MONITOR:   Vent status, Weight trends, Labs, I & O's  REASON FOR ASSESSMENT:   Consult Enteral/tube feeding initiation and management  ASSESSMENT:   69 y.o. male with a history of Diffuse Large B-Cell Lymphoma diagnosed 07/2015 on R-CHOP Chemo Rx last given on 08/09/2015 who presents to the ED with complaints of fevers and chills and cough with SOB x 2 days. He was evaluated in the ED and on chest X-ray was found to have pneumonia and an elevated lactic Acid level . A Sepsis/HCAP workup was initiated and he was placed on IV Vancomycin and Zosyn and referred for admission.   Pt's tube feeding at goal, prostat was d/c per MD. Once able to have prostat again, pt will not meet protein needs. Recommend 30 ml Prostat daily.  Patient is currently intubated on ventilator support MV: 11.5 L/min Temp (24hrs), Avg:99.9 F (37.7 C), Min:99.5 F (37.5 C), Max:100.4 F (38 C)  Propofol: 11.7 ml/hr ->provides 309 fat kcal  Labs reviewed: CBGs: 123-145 Elevated Na, BUN  Diet Order:  Diet NPO time specified  Skin:  Reviewed, no issues  Last BM:  9/3  Height:   Ht Readings from Last 1 Encounters:  08/23/15 6\' 1"  (1.854 m)    Weight:   Wt Readings from Last 1 Encounters:  08/27/15 209 lb 10.5 oz (95.1 kg)    Ideal Body Weight:  83.64 kg (kg)  BMI:  Body mass index is 27.67 kg/(m^2).  Estimated Nutritional Needs:   Kcal:   2177  Protein:  130-140g  Fluid:  2.1L/day  EDUCATION NEEDS:   No education needs identified at this time  Clayton Bibles, MS, RD, LDN Pager: [phone] After Hours Pager: [phone]

## 2015-08-27 NOTE — Progress Notes (Signed)
PULMONARY / CRITICAL CARE MEDICINE   Name: Ethan Stewart MRN: [id number] DOB: 29-Jul-1946    ADMISSION DATE:  08/20/2015 CONSULTATION DATE:  08/27/2015  REFERRING MD : Wynelle Cleveland  CHIEF COMPLAINT: SOB  INITIAL PRESENTATION: 69 y.o. M with diffuse large B cell lymphoma, brought to [facility] ED 8/29 with HCAP. Developed worsening hypoxia and respiratory distress later that evening; therefore, PCCM called for further recs..   STUDIES:  CXR 8/29 >>> worsened central lung alveolar opacities bilaterally, suspicious for infectious infiltrates. CTA chest 8/29 >>> b/l airspace disease c/w atypical PNA or non-cardiogenic edema. No PE. CXR 9/2 >> sub cut emphysema RT neck  SIGNIFICANT EVENTS: 8/29 - admitted 8/30 intubated, ARDS protocol 9/3- off pressors, diuresis neg 2.2 liters  SUBJECTIVE:  Loose stools, peep improved Neg 2.1 liters  VITAL SIGNS: Temp:  [99.1 F (37.3 C)-100.4 F (38 C)] 99.9 F (37.7 C) (09/04 0600) Pulse Rate:  [58-83] 75 (09/04 0345) Resp:  [18-41] 29 (09/04 0600) BP: (101-181)/(42-91) 116/49 mmHg (09/04 0600) SpO2:  [89 %-98 %] 94 % (09/04 0600) FiO2 (%):  [40 %-50 %] 40 % (09/04 0345) Weight:  [95.1 kg (209 lb 10.5 oz)] 95.1 kg (209 lb 10.5 oz) (09/04 0437) HEMODYNAMICS: CVP:  [6 mmHg-11 mmHg] 8 mmHg VENTILATOR SETTINGS: Vent Mode:  [-] PRVC FiO2 (%):  [40 %-50 %] 40 % Set Rate:  [28 bmp] 28 bmp Vt Set:  [480 mL] 480 mL PEEP:  [5 cmH20-8 cmH20] 5 cmH20 Pressure Support:  [5 cmH20] 5 cmH20 Plateau Pressure:  [10 cmH20-17 cmH20] 14 cmH20 INTAKE / OUTPUT: Intake/Output      09/03 0701 - 09/04 0700 09/04 0701 - 09/05 0700   I.V. (mL/kg) 1125.7 (11.8)    Other 0    NG/GT 1520    IV Piggyback 150    Total Intake(mL/kg) 2795.7 (29.4)    Urine (mL/kg/hr) 5075 (2.2)    Emesis/NG output 0 (0)    Total Output 5075     Net -2279.3            PHYSICAL EXAMINATION: General: Male of normal body habitus intubated Neuro: RASS -1, sedated HEENT: no JVD, PERRL   Cardiovascular: RRR, no MRG Lungs: coarse diffuse Abdomen: Soft, non-tender, non-distended, no r/g Musculoskeletal: No acute deformity Skin: Grossly intact  LABS:  CBC  Recent Labs Lab 08/25/15 0430 08/26/15 0450 08/27/15 0451  WBC 17.8* 19.3* 18.5*  HGB 8.3* 8.4* 8.7*  HCT 24.7* 26.0* 27.0*  PLT 355 371 399   Coag's  Recent Labs Lab 08/21/15 0316  APTT 54*  INR 1.36   BMET  Recent Labs Lab 08/25/15 0430 08/26/15 0450 08/27/15 0451  NA 145 142 147*  K 4.5 4.2 4.1  CL 110 102 103  CO2 30 33* 37*  BUN 31* 39* 43*  CREATININE 0.86 0.78 0.78  GLUCOSE 128* 150* 136*   Electrolytes  Recent Labs Lab 08/22/15 0320 08/23/15 0350  08/25/15 0430 08/26/15 0450 08/27/15 0451  CALCIUM 7.9* 7.7*  < > 8.3* 8.2* 8.3*  MG 2.1 2.3  --  2.7*  --   --   PHOS 3.3 3.8  --  3.3  --   --   < > = values in this interval not displayed. Sepsis Markers  Recent Labs Lab 08/21/15 0316 08/21/15 0615 08/21/15 1300  LATICACIDVEN 1.6 1.2 1.6  PROCALCITON 1.98  --   --    ABG  Recent Labs Lab 08/22/15 1300 08/23/15 0400 08/24/15 0315  PHART 7.357 7.393 7.319*  PCO2ART  42.1 36.2 47.8*  PO2ART 81.7 74.1* 66.6*   Liver Enzymes  Recent Labs Lab 08/20/15 2241 08/23/15 0350 08/27/15 0451  AST 67* 84* 53*  ALT 33 37 30  ALKPHOS 135* 123 98  BILITOT 0.2* <0.1* 0.7  ALBUMIN 2.4* 1.7* 2.1*   Cardiac Enzymes  Recent Labs Lab 08/21/15 1725 08/21/15 2205 08/22/15 0320  TROPONINI 0.05* 0.03 0.03   Glucose  Recent Labs Lab 08/26/15 0753 08/26/15 1229 08/26/15 1625 08/26/15 2010 08/27/15 0055 08/27/15 0431  GLUCAP 152* 119* 112* 138* 139* 145*    Imaging No results found.   ASSESSMENT / PLAN:  PULMONARY A: ETT 8/30 >> ARDS HCAP- cx neg. DD incl G-CSF related pneumonitis Barotrauma - sub cut emphysema 9/2 P:  ARDS protocol - down to 6cc/kg = 480 With diuresis, peep now to 5 improved Wean cpap 5 ps 5 goal 2 hrs Continued neg balance as  able, crt tolerates Continue steroids- reduce Reduce rate when on rest factt goal 4, last 6  CARDIOVASCULAR A:  Mild troponin leak - suspect demand ischemia. Hypotension - due to PEEP/sedatives P:  Aim for CVP 4- factt, may be somewhat inaccurate Lasix cotninued tele  RENAL A:  ARDS P:  kvo Lasix to maintain to same goals cvp goal 4 not met  GASTROINTESTINAL A:  GERD. Protein calorie malnutrition. Some loose stools increased, r/o cdiff P:  Pantoprazole. Ct TFs @ goal Ensure vital used with stools Send cdiff given ABX exposure and chemo exposure If cdiff neg, add imoidium   HEMATOLOGIC / ONCOLOGIC A:  Diffuse large B cell lymphoma (follows with Dr. Vivien Rossetti) s/p 1 cycle of R-CHOP Anemia. VTE Prophylaxis. P:  No need blood today SCD's / Lovenox.  INFECTIOUS A:  HCAP / ? Atypical PNA. R/o cdiff P:  BCx2 8/29 > ng UCx 8/29 >ng Sputum Cx 8/29 > PCP stain 8/29 >neg U. Legionella 8/29 >neg U. Strep 8/29 >neg RVP 8/29 > nasal swab >> neg RVP 8/30 BAL >> Abx: Zosyn, start date 8/29 >> Abx: Azithro, start date 8/29 >>9/2 Abx: Bactrim, start date 8/29 >> 8/31 Vanc 8/29 >>9/1   zosyn per ID  send cdiff  ENDOCRINE A:  At risk for steroid induced hyperglycemia.  P:  SSI -sensitive  NEUROLOGIC A:  Anxiety on valium Hx C5 tumor - s/p C5 corpectomy with C4-C6 arthrodesis and anterior cervical plating of C4-C6 (Dr. Ronnald Ramp - 07/26/15). P: fent / prop WUA ,mandatory PT consulted   Code Status: Full.  Family updated: 9/1 wife updated daily  Interdisciplinary Family Meeting v Palliative Care Meeting: Due by: 9/5 Ccm time 30 min   Lavon Paganini. Titus Mould, MD, [place] Pgr: [facility] [facility]

## 2015-08-28 ENCOUNTER — Inpatient Hospital Stay (HOSPITAL_COMMUNITY): Payer: Non-veteran care

## 2015-08-28 DIAGNOSIS — D72829 Elevated white blood cell count, unspecified: Secondary | ICD-10-CM

## 2015-08-28 LAB — GLUCOSE, CAPILLARY
GLUCOSE-CAPILLARY: 136 mg/dL — AB (ref 65–99)
GLUCOSE-CAPILLARY: 136 mg/dL — AB (ref 65–99)
GLUCOSE-CAPILLARY: 116 mg/dL — AB (ref 65–99)
Glucose-Capillary: 133 mg/dL — ABNORMAL HIGH (ref 65–99)
Glucose-Capillary: 137 mg/dL — ABNORMAL HIGH (ref 65–99)
Glucose-Capillary: 142 mg/dL — ABNORMAL HIGH (ref 65–99)
Glucose-Capillary: 150 mg/dL — ABNORMAL HIGH (ref 65–99)

## 2015-08-28 LAB — SEDIMENTATION RATE: SED RATE: 50 mm/h — AB (ref 0–16)

## 2015-08-28 LAB — BLOOD GAS, ARTERIAL
Acid-Base Excess: 10.4 mmol/L — ABNORMAL HIGH (ref 0.0–2.0)
BICARBONATE: 35.1 meq/L — AB (ref 20.0–24.0)
DRAWN BY: 441261
FIO2: 0.4
O2 Saturation: 96.3 %
PATIENT TEMPERATURE: 98.6
PCO2 ART: 48.9 mmHg — AB (ref 35.0–45.0)
PEEP: 8 cmH2O
RATE: 20 resp/min
TCO2: 32.3 mmol/L (ref 0–100)
VT: 480 mL
pH, Arterial: 7.469 — ABNORMAL HIGH (ref 7.350–7.450)
pO2, Arterial: 86.6 mmHg (ref 80.0–100.0)

## 2015-08-28 LAB — MAGNESIUM: MAGNESIUM: 2.6 mg/dL — AB (ref 1.7–2.4)

## 2015-08-28 LAB — BASIC METABOLIC PANEL
ANION GAP: 7 (ref 5–15)
BUN: 46 mg/dL — ABNORMAL HIGH (ref 6–20)
CALCIUM: 8.6 mg/dL — AB (ref 8.9–10.3)
CO2: 37 mmol/L — ABNORMAL HIGH (ref 22–32)
Chloride: 103 mmol/L (ref 101–111)
Creatinine, Ser: 0.67 mg/dL (ref 0.61–1.24)
Glucose, Bld: 145 mg/dL — ABNORMAL HIGH (ref 65–99)
POTASSIUM: 4.2 mmol/L (ref 3.5–5.1)
Sodium: 147 mmol/L — ABNORMAL HIGH (ref 135–145)

## 2015-08-28 LAB — C-REACTIVE PROTEIN: CRP: 2.6 mg/dL — AB (ref <1.0)

## 2015-08-28 LAB — PHOSPHORUS: Phosphorus: 3.8 mg/dL (ref 2.5–4.6)

## 2015-08-28 LAB — TRIGLYCERIDES: Triglycerides: 97 mg/dL (ref <150)

## 2015-08-28 MED ORDER — DEXAMETHASONE SODIUM PHOSPHATE 4 MG/ML IJ SOLN
4.0000 mg | Freq: Two times a day (BID) | INTRAMUSCULAR | Status: DC
  Administered 2015-08-28 – 2015-08-29 (×3): 4 mg via INTRAVENOUS
  Filled 2015-08-28 (×3): qty 1

## 2015-08-28 MED ORDER — ACETYLCYSTEINE 20 % IN SOLN
4.0000 mL | RESPIRATORY_TRACT | Status: AC
  Administered 2015-08-28: 4 mL via RESPIRATORY_TRACT
  Filled 2015-08-28: qty 4

## 2015-08-28 MED ORDER — SUCCINYLCHOLINE CHLORIDE 20 MG/ML IJ SOLN
INTRAMUSCULAR | Status: AC
  Filled 2015-08-28: qty 1

## 2015-08-28 MED ORDER — ALBUTEROL SULFATE (2.5 MG/3ML) 0.083% IN NEBU
INHALATION_SOLUTION | RESPIRATORY_TRACT | Status: AC
  Filled 2015-08-28: qty 3

## 2015-08-28 MED ORDER — VANCOMYCIN HCL IN DEXTROSE 1-5 GM/200ML-% IV SOLN
1000.0000 mg | Freq: Three times a day (TID) | INTRAVENOUS | Status: DC
  Administered 2015-08-28 – 2015-09-01 (×13): 1000 mg via INTRAVENOUS
  Filled 2015-08-28 (×13): qty 200

## 2015-08-28 MED ORDER — FUROSEMIDE 10 MG/ML IJ SOLN
40.0000 mg | Freq: Once | INTRAMUSCULAR | Status: AC
  Administered 2015-08-28: 40 mg via INTRAVENOUS
  Filled 2015-08-28: qty 4

## 2015-08-28 MED ORDER — SODIUM CHLORIDE 0.9 % IV BOLUS (SEPSIS): 500.0000 mL | Freq: Once | INTRAVENOUS | Status: DC

## 2015-08-28 MED ORDER — DEXTROSE 5 % IV SOLN
2.0000 g | INTRAVENOUS | Status: AC
  Administered 2015-08-28: 2 g via INTRAVENOUS
  Filled 2015-08-28: qty 2

## 2015-08-28 MED ORDER — ROCURONIUM BROMIDE 50 MG/5ML IV SOLN
INTRAVENOUS | Status: AC
  Filled 2015-08-28: qty 2

## 2015-08-28 MED ORDER — LIDOCAINE HCL (CARDIAC) 20 MG/ML IV SOLN
INTRAVENOUS | Status: AC
  Filled 2015-08-28: qty 5

## 2015-08-28 MED ORDER — ACETYLCYSTEINE 20 % IN SOLN
RESPIRATORY_TRACT | Status: AC
  Filled 2015-08-28: qty 4

## 2015-08-28 MED ORDER — VANCOMYCIN HCL IN DEXTROSE 1-5 GM/200ML-% IV SOLN
1000.0000 mg | INTRAVENOUS | Status: AC
  Administered 2015-08-28: 1000 mg via INTRAVENOUS
  Filled 2015-08-28: qty 200

## 2015-08-28 MED ORDER — ALBUTEROL SULFATE (2.5 MG/3ML) 0.083% IN NEBU
INHALATION_SOLUTION | RESPIRATORY_TRACT | Status: AC
  Administered 2015-08-28: 2.5 mg
  Filled 2015-08-28: qty 3

## 2015-08-28 MED ORDER — ETOMIDATE 2 MG/ML IV SOLN
INTRAVENOUS | Status: AC
  Administered 2015-08-28: 20 mg via INTRAVENOUS
  Filled 2015-08-28: qty 20

## 2015-08-28 MED ORDER — ETOMIDATE 2 MG/ML IV SOLN
20.0000 mg | Freq: Once | INTRAVENOUS | Status: AC
  Administered 2015-08-28: 20 mg via INTRAVENOUS

## 2015-08-28 MED ORDER — SODIUM CHLORIDE 0.9 % IV BOLUS (SEPSIS)
500.0000 mL | Freq: Once | INTRAVENOUS | Status: AC
  Administered 2015-08-28: 500 mL via INTRAVENOUS

## 2015-08-28 MED ORDER — FENTANYL BOLUS VIA INFUSION
100.0000 ug | Freq: Once | INTRAVENOUS | Status: AC
  Administered 2015-08-28: 100 ug via INTRAVENOUS
  Filled 2015-08-28: qty 100

## 2015-08-28 MED ORDER — ACETYLCYSTEINE 20 % IN SOLN
4.0000 mL | Freq: Two times a day (BID) | RESPIRATORY_TRACT | Status: AC
  Administered 2015-08-28 – 2015-08-30 (×4): 4 mL via RESPIRATORY_TRACT
  Filled 2015-08-28 (×4): qty 4

## 2015-08-28 MED ORDER — FREE WATER
300.0000 mL | Freq: Three times a day (TID) | Status: DC
  Administered 2015-08-28 – 2015-08-29 (×4): 300 mL

## 2015-08-28 MED ORDER — DEXTROSE 5 % IV SOLN
2.0000 g | Freq: Three times a day (TID) | INTRAVENOUS | Status: DC
  Administered 2015-08-28 – 2015-09-02 (×15): 2 g via INTRAVENOUS
  Filled 2015-08-28 (×17): qty 2

## 2015-08-28 NOTE — Plan of Care (Signed)
Problem: Phase II Progression Outcomes Goal: Progress activities as ordered Outcome: Progressing Chair position in bed x2 hrs. Tolerated well.

## 2015-08-28 NOTE — Progress Notes (Signed)
Pt w/ persistent desat to 70s on vent after repositioning. PRN Fentanyl given d/t biting ETT. O2 breath initiated and ETT suctioned; sats remain in 70s. RT and MD Titus Mould called to bedside to assess. Stat CXR ordered.  Dorrene German, RN

## 2015-08-28 NOTE — Progress Notes (Addendum)
PT Cancellation Note  Patient Details Name: Ethan Stewart MRN: [id number] DOB: 1946/05/01   Cancelled Treatment:    Reason Eval/Treat Not Completed: Patient not medically ready; aware of orders for "PT on VENT", however unable to see pt today d/t medical issues that prohibit therapy--BP 195/104, HR 139 and he is sedated; will ck again next day or as schedule permits, if pt is still medically unstable will D/C and await re-order ( as this is our THIRD consecutive cancel for medical reasons) Thank you.    [facility] 08/28/2015, 9:09 AM

## 2015-08-28 NOTE — Progress Notes (Signed)
ANTIBIOTIC CONSULT NOTE - FOLLOW UP  Pharmacy Consult for Zosyn--> Vancomycin / Ceftazidime Indication: HCAP vs acute lung injury related to recent neulasta  No Known Allergies  Patient Measurements: Height: 6\' 1"  (185.4 cm) Weight: 199 lb 4.7 oz (90.4 kg) IBW/kg (Calculated) : 79.9 Adjusted Body Weight:   Vital Signs: Temp: 100 F (37.8 C) (09/05 0900) BP: 158/68 mmHg (09/05 0900) Pulse Rate: 139 (09/05 0734) Intake/Output from previous day: 09/04 0701 - 09/05 0700 In: 2730.5 [I.V.:1110.5; NG/GT:1470; IV Piggyback:150] Out: 8676 [Urine:5040] Intake/Output from this shift:    Labs:  Recent Labs  08/26/15 0450 08/27/15 0451 08/28/15 0416  WBC 19.3* 18.5*  --   HGB 8.4* 8.7*  --   PLT 371 399  --   CREATININE 0.78 0.78 0.67   Estimated Creatinine Clearance: 98.5 mL/min (by C-G formula based on Cr of 0.67). No results for input(s): VANCOTROUGH, VANCOPEAK, VANCORANDOM, GENTTROUGH, GENTPEAK, GENTRANDOM, TOBRATROUGH, TOBRAPEAK, TOBRARND, AMIKACINPEAK, AMIKACINTROU, AMIKACIN in the last 72 hours.   Microbiology: Recent Results (from the past 720 hour(s))  Blood culture (routine x 2)     Status: None   Collection Time: 08/20/15 10:41 PM  Result Value Ref Range Status   Specimen Description BLOOD LEFT ANTECUBITAL  Final   Special Requests BOTTLES DRAWN AEROBIC AND ANAEROBIC 5ML  Final   Culture   Final    NO GROWTH 5 DAYS Performed at [facility]    Report Status 08/26/2015 FINAL  Final  Urine culture     Status: None   Collection Time: 08/21/15 12:59 AM  Result Value Ref Range Status   Specimen Description URINE, RANDOM  Final   Special Requests Immunocompromised  Final   Culture   Final    NO GROWTH 1 DAY Performed at [facility]    Report Status 08/22/2015 FINAL  Final  Culture, blood (routine x 2)     Status: None   Collection Time: 08/21/15  5:25 PM  Result Value Ref Range Status   Specimen Description BLOOD LEFT HAND  Final   Special  Requests IN PEDIATRIC BOTTLE  4CC  Final   Culture   Final    NO GROWTH 5 DAYS Performed at [facility]    Report Status 08/26/2015 FINAL  Final  Respiratory virus panel     Status: None   Collection Time: 08/21/15  5:26 PM  Result Value Ref Range Status   Source - RVPAN NOSE  Corrected   Respiratory Syncytial Virus A Negative Negative Final   Respiratory Syncytial Virus B Negative Negative Final   Influenza A Negative Negative Final   Influenza B Negative Negative Final   Parainfluenza 1 Negative Negative Final   Parainfluenza 2 Negative Negative Final   Parainfluenza 3 Negative Negative Final   Metapneumovirus Negative Negative Final   Rhinovirus Negative Negative Final   Adenovirus Negative Negative Final    Comment: (NOTE) Performed At: [facility] [address] [place], [place] [id number] Lindon Romp MD [id number]   Culture, blood (routine x 2)     Status: None   Collection Time: 08/21/15  5:35 PM  Result Value Ref Range Status   Specimen Description BLOOD LEFT HAND  Final   Special Requests BOTTLES DRAWN AEROBIC AND ANAEROBIC  6CC  Final   Culture   Final    NO GROWTH 5 DAYS Performed at [facility]    Report Status 08/26/2015 FINAL  Final  Culture, bal-quantitative     Status: None  Collection Time: 08/22/15 12:48 PM  Result Value Ref Range Status   Specimen Description BRONCHIAL ALVEOLAR LAVAGE  Final   Special Requests Immunocompromised  Final   Gram Stain   Final    RARE WBC PRESENT,BOTH PMN AND MONONUCLEAR NO SQUAMOUS EPITHELIAL CELLS SEEN NO ORGANISMS SEEN Performed at [facility]    Colony Count NO GROWTH Performed at [facility]   Final   Culture   Final    NO GROWTH 2 Note: CORRECTED RESULTS CALLED TO: RUBY WL 9 16@1130  BY PARDA Performed at [facility]    Report Status 08/25/2015 FINAL  Final  Fungus Culture with Smear     Status: None (Preliminary result)   Collection Time: 08/22/15  12:48 PM  Result Value Ref Range Status   Specimen Description BRONCHIAL ALVEOLAR LAVAGE  Final   Special Requests Immunocompromised  Final   Fungal Smear   Final    NO YEAST OR FUNGAL ELEMENTS SEEN Performed at [facility]    Culture   Final    CULTURE IN PROGRESS FOR FOUR WEEKS Performed at [facility]    Report Status PENDING  Incomplete  Pneumocystis smear by DFA     Status: None   Collection Time: 08/22/15 12:49 PM  Result Value Ref Range Status   Specimen Source-PJSRC BRONCHIAL ALVEOLAR LAVAGE  Final   Pneumocystis jiroveci Ag NEGATIVE  Final    Comment: Performed at [place] [facility]  Respiratory virus panel     Status: None   Collection Time: 08/23/15 11:47 PM  Result Value Ref Range Status   Respiratory Syncytial Virus A Negative Negative Final   Respiratory Syncytial Virus B Negative Negative Final   Influenza A Negative Negative Final   Influenza B Negative Negative Final   Parainfluenza 1 Negative Negative Final   Parainfluenza 2 Negative Negative Final   Parainfluenza 3 Negative Negative Final   Metapneumovirus Negative Negative Final   Rhinovirus Negative Negative Final   Adenovirus Negative Negative Final    Comment: (NOTE) Performed At: [facility] [place], [place] [id number] Lindon Romp MD WE:[id number]     Anti-infectives    Start     Dose/Rate Route Frequency Ordered Stop   08/22/15 0200  sulfamethoxazole-trimethoprim (BACTRIM) 500 mg in dextrose 5 % 500 mL IVPB  Status:  Discontinued     500 mg 354.2 mL/hr over 90 Minutes Intravenous Every 8 hours 08/21/15 1652 08/23/15 1055   08/21/15 2300  vancomycin (VANCOCIN) IVPB 1000 mg/200 mL premix  Status:  Discontinued     1,000 mg 200 mL/hr over 60 Minutes Intravenous  Once 08/21/15 0153 08/21/15 0217   08/21/15 1700  sulfamethoxazole-trimethoprim (BACTRIM) 500 mg in dextrose 5 % 500 mL IVPB     500 mg 354.2 mL/hr over 90 Minutes Intravenous NOW  08/21/15 1652 08/21/15 1853   08/21/15 1200  vancomycin (VANCOCIN) 1,250 mg in sodium chloride 0.9 % 250 mL IVPB  Status:  Discontinued     1,250 mg 166.7 mL/hr over 90 Minutes Intravenous Every 12 hours 08/21/15 0222 08/21/15 1624   08/21/15 1100  azithromycin (ZITHROMAX) tablet 500 mg  Status:  Discontinued     500 mg Oral Daily 08/21/15 1052 08/23/15 0848   08/21/15 0800  piperacillin-tazobactam (ZOSYN) IVPB 3.375 g  Status:  Discontinued     3.375 g 100 mL/hr over 30 Minutes Intravenous 3 times per day 08/21/15 0153 08/21/15 0218   08/21/15 0800  piperacillin-tazobactam (  ZOSYN) IVPB 3.375 g  Status:  Discontinued     3.375 g 12.5 mL/hr over 240 Minutes Intravenous Every 8 hours 08/21/15 0218 08/28/15 1057   08/20/15 2345  vancomycin (VANCOCIN) IVPB 1000 mg/200 mL premix     1,000 mg 200 mL/hr over 60 Minutes Intravenous  Once 08/20/15 2338 08/21/15 0227   08/20/15 2345  piperacillin-tazobactam (ZOSYN) IVPB 4.5 g  Status:  Discontinued     4.5 g 200 mL/hr over 30 Minutes Intravenous  Once 08/20/15 2338 08/20/15 2341   08/20/15 2345  piperacillin-tazobactam (ZOSYN) IVPB 3.375 g     3.375 g 100 mL/hr over 30 Minutes Intravenous  Once 08/20/15 2341 08/21/15 0127      Assessment: 74 yoM with DLBCL on chemotherapy admitted 8/29 with worsening hypoxia and respiratory distress later that evening, now intubated and sedated in ICU.  Pt on zosyn for possible HCAP vs neulasta induced ARDS/pneumonitis in setting of lung infection / lung injury from chemotherapy.  9/5 pt with acute desaturation.  CXR neg for PTX, but concern for RLL increasing atelectasis vs developing infiltrate. STAT bronch performed and CCM notes complete obstruction of all lobes.  Antibiotics changed to vancomycin and ceftazidime.   Anti-infectives 8/28 >>zosyn >> 9/5 8/29 >>vanc >> 8/29, 9/5 >> Vanc >> 8/29 >> azithromycin >> 8/31 8/30 >> bactrim >> 8/31 9/5 >> ceftazidime >>  Vitals/Labs Temp: afebrile since 8/30 WBC:  elevated (Neulasta, decadron), sl decrease today Renal: SCr stable wnl; CrCl ~100 CG, 88 N  Cultures 8/28 blood: ngtd 8/29 blood x2: ngtd 8/29 urine: NGF 8/29 legionella and strep pneumoniae urine antigens: neg/neg 8/29 resp virus panel: negative 8/30 BAL pneumocystis smear: negative 8/30 BAL cx: oral flora 8/30 CMV studies: IgG+, IgM- 8/30 BAL fungus culture with smear: ngtd 8/31 repeat resp virus panel: neg 9/4 CDiff - no sample, precautions dropped  Goal of Therapy:  Eradication of infection  Plan:  Day 8 Antibiotics Stop zosyn Start ceftazidime 2g IV q8h Start vancomycin 1g IV q8h F/u cultures, renal function, clinical course F/u duration of therapy  Ralene Bathe, PharmD, BCPS 08/28/2015, 11:07 AM  Pager: [phone]

## 2015-08-28 NOTE — Progress Notes (Signed)
10cc of 2500 mcg in 250 ml Fentanyl gtt wasted in sink w/ Buren Kos, RN.   Dorrene German, RN

## 2015-08-28 NOTE — Progress Notes (Addendum)
PULMONARY / CRITICAL CARE MEDICINE   Name: Ethan Stewart MRN: [id number] DOB: [birth year]    ADMISSION DATE:  08/20/2015 CONSULTATION DATE:  08/28/2015  REFERRING MD : Wynelle Cleveland  CHIEF COMPLAINT: SOB  INITIAL PRESENTATION: 69 y.o. M with diffuse large B cell lymphoma, brought to [facility] ED 8/29 with HCAP. Developed worsening hypoxia and respiratory distress later that evening; therefore, PCCM called for further recs..   STUDIES:  CXR 8/29 >>> worsened central lung alveolar opacities bilaterally, suspicious for infectious infiltrates. CTA chest 8/29 >>> b/l airspace disease c/w atypical PNA or non-cardiogenic edema. No PE. CXR 9/2 >> sub cut emphysema RT neck  SIGNIFICANT EVENTS: 8/29 - admitted 8/30 intubated, ARDS protocol 9/3- off pressors, diuresis neg 2.2 liters  SUBJECTIVE:  Weaned then tachy after 1 hr and increase rr Neg 2.7 liters  VITAL SIGNS: Temp:  [99.7 F (37.6 C)-100.6 F (38.1 C)] 100 F (37.8 C) (09/05 0700) Pulse Rate:  [70-97] 97 (09/05 0630) Resp:  [14-41] 20 (09/05 0700) BP: (97-195)/(48-104) 195/104 mmHg (09/05 0700) SpO2:  [90 %-98 %] 95 % (09/05 0700) FiO2 (%):  [40 %] 40 % (09/05 0630) Weight:  [90.4 kg (199 lb 4.7 oz)] 90.4 kg (199 lb 4.7 oz) (09/05 0600) HEMODYNAMICS: CVP:  [2 mmHg-9 mmHg] 4 mmHg VENTILATOR SETTINGS: Vent Mode:  [-] PSV FiO2 (%):  [40 %] 40 % Set Rate:  [20 bmp] 20 bmp Vt Set:  [480 mL] 480 mL PEEP:  [5 cmH20] 5 cmH20 Pressure Support:  [5 cmH20] 5 cmH20 Plateau Pressure:  [11 cmH20-17 cmH20] 11 cmH20 INTAKE / OUTPUT: Intake/Output      09/04 0701 - 09/05 0700 09/05 0701 - 09/06 0700   I.V. (mL/kg) 1110.5 (12.3)    Other     NG/GT 1470    IV Piggyback 150    Total Intake(mL/kg) 2730.5 (30.2)    Urine (mL/kg/hr) 5040 (2.3)    Emesis/NG output     Total Output 5040     Net -2309.5            PHYSICAL EXAMINATION: General: Male of normal body habitus intubated, collar Neuro: RASS -1, sedated HEENT: PERRL   Cardiovascular: RRR, no MRG Lungs: coarse diffuse, mild anterior Abdomen: Soft, non-tender, non-distended, no r/g Musculoskeletal: No acute deformity Skin: Grossly intact  LABS:  CBC  Recent Labs Lab 08/25/15 0430 08/26/15 0450 08/27/15 0451  WBC 17.8* 19.3* 18.5*  HGB 8.3* 8.4* 8.7*  HCT 24.7* 26.0* 27.0*  PLT 355 371 399   Coag's No results for input(s): APTT, INR in the last 168 hours. BMET  Recent Labs Lab 08/26/15 0450 08/27/15 0451 08/28/15 0416  NA 142 147* 147*  K 4.2 4.1 4.2  CL 102 103 103  CO2 33* 37* 37*  BUN 39* 43* 46*  CREATININE 0.78 0.78 0.67  GLUCOSE 150* 136* 145*   Electrolytes  Recent Labs Lab 08/23/15 0350  08/25/15 0430 08/26/15 0450 08/27/15 0451 08/28/15 0416  CALCIUM 7.7*  < > 8.3* 8.2* 8.3* 8.6*  MG 2.3  --  2.7*  --   --  2.6*  PHOS 3.8  --  3.3  --   --  3.8  < > = values in this interval not displayed. Sepsis Markers  Recent Labs Lab 08/21/15 1300  LATICACIDVEN 1.6   ABG  Recent Labs Lab 08/22/15 1300 08/23/15 0400 08/24/15 0315  PHART 7.357 7.393 7.319*  PCO2ART 42.1 36.2 47.8*  PO2ART 81.7 74.1* 66.6*   Liver Enzymes  Recent Labs Lab  08/23/15 0350 08/27/15 0451  AST 84* 53*  ALT 37 30  ALKPHOS 123 98  BILITOT <0.1* 0.7  ALBUMIN 1.7* 2.1*   Cardiac Enzymes  Recent Labs Lab 08/21/15 1725 08/21/15 2205 08/22/15 0320  TROPONINI 0.05* 0.03 0.03   Glucose  Recent Labs Lab 08/27/15 0744 08/27/15 1203 08/27/15 1634 08/27/15 2052 08/28/15 0052 08/28/15 0436  GLUCAP 134* 123* 128* 137* 142* 133*    Imaging Dg Chest Port 1 View  08/28/2015   CLINICAL DATA:  ARDS.  History of lymphoma.  Worsening hypoxia.  EXAM: PORTABLE CHEST - 1 VIEW  COMPARISON:  08/27/2015  FINDINGS: Endotracheal tube with tip measuring 7.2 cm above the carinal. Enteric tube tip is in the upper stomach just below the EG junction. Left PICC catheter with tip over the cavoatrial junction region. Shallow inspiration. Patchy  linear infiltrates in the mid lungs and right lower lung appear generally stable since previous study. No blunting of costophrenic angles. Subcutaneous emphysema in the right neck and anterior chest wall without definite change. Pneumomediastinum on the right. No definite pneumothorax.  IMPRESSION: Appliances are unchanged in position. Patchy airspace infiltrates in the lungs without change. Subcutaneous emphysema in the right neck and upper chest with pneumo mediastinum. No significant change.   Electronically Signed   By: Lucienne Capers M.D.   On: 08/28/2015 05:20     ASSESSMENT / PLAN:  PULMONARY A: ETT 8/30 >> ARDS HCAP- cx neg. DD incl G-CSF related pneumonitis Barotrauma - sub cut emphysema 9/2 pneumomediastinum from ards P:  ards protocol remain , keeping plat less 30  Weaned this am 1 hr, then failed,repeat weaning daily sbt  Would have future goals SBT 2 hr with abg Neg balance has been successful pcxr again for air noted, no ptx noted and he is improving , so will try to avoid chest tubes facct cvp 4 noted Reduce steroids to q12h  CARDIOVASCULAR A:  Mild troponin leak - suspect demand ischemia. Hypotension - due to PEEP/sedatives HTN 9/5 off sedation P:  cvp 4 goal met  Lasix cotninued Tele Restart prop, re assess BP, will treat if sys greater 150  RENAL A:  ARDS P:  kvo Lasix reduction Add free water cvp goal 4 met  GASTROINTESTINAL A:  GERD. Protein calorie malnutrition. Some loose stools increased, r/o cdiff sent but since sent then no stool noted - NOT a clinical circumstance of cdiff now P:  Pantoprazole Ct TFs @ goal Ensure vital used with stools Dc cdiff assay  HEMATOLOGIC / ONCOLOGIC A:  Diffuse large B cell lymphoma (follows with Dr. Vivien Rossetti) s/p 1 cycle of R-CHOP Anemia. VTE Prophylaxis. P:  SCD's / Lovenox.  INFECTIOUS A:  HCAP / ? Atypical PNA. NO stools now since 2 days P:  BCx2 8/29 > ng UCx 8/29  >ng Sputum Cx 8/29 > PCP stain 8/29 >neg U. Legionella 8/29 >neg U. Strep 8/29 >neg RVP 8/29 > nasal swab >> neg RVP 8/30 BAL >> Abx: Zosyn, start date 8/29 >> Abx: Azithro, start date 8/29 >>9/2 Abx: Bactrim, start date 8/29 >> 8/31 Vanc 8/29 >>9/1   zosyn per ID  send cdiff if stool re occur  ENDOCRINE A:  At risk for steroid induced hyperglycemia.  P:  SSI -sensitive  NEUROLOGIC A:  Anxiety on valium Hx C5 tumor - s/p C5 corpectomy with C4-C6 arthrodesis and anterior cervical plating of C4-C6 (Dr. Ronnald Ramp - 07/26/15). P: fent / prop WUA ,mandatory PT consulted, i want him to ambulate  Code Status: Full.  Family updated: 9/1-9/5 wife updated daily  Interdisciplinary Family Meeting v Palliative Care Meeting: Due by: 9/5 by DF Ccm time 30 min   Lavon Paganini. Titus Mould, MD, [place] Pgr: [facility]

## 2015-08-28 NOTE — Progress Notes (Addendum)
ANTIBIOTIC CONSULT NOTE - FOLLOW UP  Pharmacy Consult for Zosyn Indication: HCAP vs acute lung injury related to recent neulasta  No Known Allergies  Patient Measurements: Height: 6\' 1"  (185.4 cm) Weight: 199 lb 4.7 oz (90.4 kg) IBW/kg (Calculated) : 79.9 Adjusted Body Weight:   Vital Signs: Temp: 100 F (37.8 C) (09/05 0700) BP: 195/104 mmHg (09/05 0734) Pulse Rate: 139 (09/05 0734) Intake/Output from previous day: 09/04 0701 - 09/05 0700 In: 2730.5 [I.V.:1110.5; NG/GT:1470; IV Piggyback:150] Out: 1093 [Urine:5040] Intake/Output from this shift:    Labs:  Recent Labs  08/26/15 0450 08/27/15 0451 08/28/15 0416  WBC 19.3* 18.5*  --   HGB 8.4* 8.7*  --   PLT 371 399  --   CREATININE 0.78 0.78 0.67   Estimated Creatinine Clearance: 98.5 mL/min (by C-G formula based on Cr of 0.67). No results for input(s): VANCOTROUGH, VANCOPEAK, VANCORANDOM, GENTTROUGH, GENTPEAK, GENTRANDOM, TOBRATROUGH, TOBRAPEAK, TOBRARND, AMIKACINPEAK, AMIKACINTROU, AMIKACIN in the last 72 hours.   Microbiology: Recent Results (from the past 720 hour(s))  Blood culture (routine x 2)     Status: None   Collection Time: 08/20/15 10:41 PM  Result Value Ref Range Status   Specimen Description BLOOD LEFT ANTECUBITAL  Final   Special Requests BOTTLES DRAWN AEROBIC AND ANAEROBIC 5ML  Final   Culture   Final    NO GROWTH 5 DAYS Performed at [facility]    Report Status 08/26/2015 FINAL  Final  Urine culture     Status: None   Collection Time: 08/21/15 12:59 AM  Result Value Ref Range Status   Specimen Description URINE, RANDOM  Final   Special Requests Immunocompromised  Final   Culture   Final    NO GROWTH 1 DAY Performed at [facility]    Report Status 08/22/2015 FINAL  Final  Culture, blood (routine x 2)     Status: None   Collection Time: 08/21/15  5:25 PM  Result Value Ref Range Status   Specimen Description BLOOD LEFT HAND  Final   Special Requests IN PEDIATRIC BOTTLE   4CC  Final   Culture   Final    NO GROWTH 5 DAYS Performed at [facility]    Report Status 08/26/2015 FINAL  Final  Respiratory virus panel     Status: None   Collection Time: 08/21/15  5:26 PM  Result Value Ref Range Status   Source - RVPAN NOSE  Corrected   Respiratory Syncytial Virus A Negative Negative Final   Respiratory Syncytial Virus B Negative Negative Final   Influenza A Negative Negative Final   Influenza B Negative Negative Final   Parainfluenza 1 Negative Negative Final   Parainfluenza 2 Negative Negative Final   Parainfluenza 3 Negative Negative Final   Metapneumovirus Negative Negative Final   Rhinovirus Negative Negative Final   Adenovirus Negative Negative Final    Comment: (NOTE) Performed At: [facility] [address] [place], [place] [id number] Lindon Romp MD UR:[id number]   Culture, blood (routine x 2)     Status: None   Collection Time: 08/21/15  5:35 PM  Result Value Ref Range Status   Specimen Description BLOOD LEFT HAND  Final   Special Requests BOTTLES DRAWN AEROBIC AND ANAEROBIC  6CC  Final   Culture   Final    NO GROWTH 5 DAYS Performed at [facility]    Report Status 08/26/2015 FINAL  Final  Culture, bal-quantitative     Status: None   Collection Time:  08/22/15 12:48 PM  Result Value Ref Range Status   Specimen Description BRONCHIAL ALVEOLAR LAVAGE  Final   Special Requests Immunocompromised  Final   Gram Stain   Final    RARE WBC PRESENT,BOTH PMN AND MONONUCLEAR NO SQUAMOUS EPITHELIAL CELLS SEEN NO ORGANISMS SEEN Performed at [facility]    Colony Count NO GROWTH Performed at [facility]   Final   Culture   Final    NO GROWTH 2 Note: CORRECTED RESULTS CALLED TO: RUBY WL 9 16@1130  BY PARDA Performed at [facility]    Report Status 08/25/2015 FINAL  Final  Fungus Culture with Smear     Status: None (Preliminary result)   Collection Time: 08/22/15 12:48 PM  Result Value Ref  Range Status   Specimen Description BRONCHIAL ALVEOLAR LAVAGE  Final   Special Requests Immunocompromised  Final   Fungal Smear   Final    NO YEAST OR FUNGAL ELEMENTS SEEN Performed at [facility]    Culture   Final    CULTURE IN PROGRESS FOR FOUR WEEKS Performed at [facility]    Report Status PENDING  Incomplete  Pneumocystis smear by DFA     Status: None   Collection Time: 08/22/15 12:49 PM  Result Value Ref Range Status   Specimen Source-PJSRC BRONCHIAL ALVEOLAR LAVAGE  Final   Pneumocystis jiroveci Ag NEGATIVE  Final    Comment: Performed at [place] [facility]  Respiratory virus panel     Status: None   Collection Time: 08/23/15 11:47 PM  Result Value Ref Range Status   Respiratory Syncytial Virus A Negative Negative Final   Respiratory Syncytial Virus B Negative Negative Final   Influenza A Negative Negative Final   Influenza B Negative Negative Final   Parainfluenza 1 Negative Negative Final   Parainfluenza 2 Negative Negative Final   Parainfluenza 3 Negative Negative Final   Metapneumovirus Negative Negative Final   Rhinovirus Negative Negative Final   Adenovirus Negative Negative Final    Comment: (NOTE) Performed At: [facility] [place], [place] [id number] Lindon Romp MD NO:[id number]     Anti-infectives    Start     Dose/Rate Route Frequency Ordered Stop   08/22/15 0200  sulfamethoxazole-trimethoprim (BACTRIM) 500 mg in dextrose 5 % 500 mL IVPB  Status:  Discontinued     500 mg 354.2 mL/hr over 90 Minutes Intravenous Every 8 hours 08/21/15 1652 08/23/15 1055   08/21/15 2300  vancomycin (VANCOCIN) IVPB 1000 mg/200 mL premix  Status:  Discontinued     1,000 mg 200 mL/hr over 60 Minutes Intravenous  Once 08/21/15 0153 08/21/15 0217   08/21/15 1700  sulfamethoxazole-trimethoprim (BACTRIM) 500 mg in dextrose 5 % 500 mL IVPB     500 mg 354.2 mL/hr over 90 Minutes Intravenous NOW 08/21/15 1652 08/21/15 1853    08/21/15 1200  vancomycin (VANCOCIN) 1,250 mg in sodium chloride 0.9 % 250 mL IVPB  Status:  Discontinued     1,250 mg 166.7 mL/hr over 90 Minutes Intravenous Every 12 hours 08/21/15 0222 08/21/15 1624   08/21/15 1100  azithromycin (ZITHROMAX) tablet 500 mg  Status:  Discontinued     500 mg Oral Daily 08/21/15 1052 08/23/15 0848   08/21/15 0800  piperacillin-tazobactam (ZOSYN) IVPB 3.375 g  Status:  Discontinued     3.375 g 100 mL/hr over 30 Minutes Intravenous 3 times per day 08/21/15 0153 08/21/15 0218   08/21/15 0800  piperacillin-tazobactam (ZOSYN) IVPB  3.375 g     3.375 g 12.5 mL/hr over 240 Minutes Intravenous Every 8 hours 08/21/15 0218     08/20/15 2345  vancomycin (VANCOCIN) IVPB 1000 mg/200 mL premix     1,000 mg 200 mL/hr over 60 Minutes Intravenous  Once 08/20/15 2338 08/21/15 0227   08/20/15 2345  piperacillin-tazobactam (ZOSYN) IVPB 4.5 g  Status:  Discontinued     4.5 g 200 mL/hr over 30 Minutes Intravenous  Once 08/20/15 2338 08/20/15 2341   08/20/15 2345  piperacillin-tazobactam (ZOSYN) IVPB 3.375 g     3.375 g 100 mL/hr over 30 Minutes Intravenous  Once 08/20/15 2341 08/21/15 0127      Assessment: 23 yoM with DLBCL on chemotherapy admitted 8/29 with worsening hypoxia and respiratory distress later that evening, now intubated and sedated in ICU.  Pt on zosyn for possible HCAP vs neulasta induced ARDS/pneumonitis in setting of lung infection / lung injury from chemotherapy.    Anti-infectives 8/28 >>zosyn >> 8/29 >>vanc >> 8/29 8/29 >> azithromycin >> 8/31 8/30 >> bactrim >> 8/31  Vitals/Labs Temp: afebrile since 8/30 WBC: elevated (Neulasta, decadron), sl decrease today Renal: SCr stable wnl; CrCl ~100 CG, 88 N  Cultures 8/28 blood: ngtd 8/29 blood x2: ngtd 8/29 urine: NGF 8/29 legionella and strep pneumoniae urine antigens: neg/neg 8/29 resp virus panel: negative 8/30 BAL pneumocystis smear: negative 8/30 BAL cx: oral flora 8/30 CMV studies: IgG+,  IgM- 8/30 BAL fungus culture with smear: ngtd 8/31 repeat resp virus panel: neg 9/4 CDiff - no sample, precautions dropped  Goal of Therapy:  Eradication of infection  Plan:  Day 8 Antibiotics Continue Zosyn 3.375g IV q8h (infuse over 4 hours) F/u cultures, renal function, clinical course F/u duration of therapy  Ralene Bathe, PharmD, BCPS 08/28/2015, 8:42 AM  Pager: [phone]

## 2015-08-28 NOTE — Progress Notes (Signed)
[name] Kitchen   HEMATOLOGY/ONCOLOGY INPATIENT PROGRESS NOTE  Date of Service: 08/28/2015  Inpatient Attending: .Rigoberto Noel, MD   SUBJECTIVE  Mr. Lazare was seen around noon today. He was noted to have some desaturation episodes this morning and test x-ray showed right lung atelectasis likely due to mucous plugging. He required stat bronchoscopy with suctioning of mucous and Mucomyst treatment repeat x-ray after bronchoscopic mucous plug clearance shows improvement infiltrates.  Some low-grade fevers at 100.4 F. Persistent leukocytosis likely due to steroids and Neulasta . Dexamethasone reduced to 4 mg every 12 today. Antibiotic changed from Zosyn to ceftazidime plus vancomycin today as per critical care team.  Working on the vent weaning at this time.   OBJECTIVE:   PHYSICAL EXAMINATION: . Filed Vitals:   08/28/15 1130 08/28/15 1145 08/28/15 1154 08/28/15 1200  BP: 90/53 89/55 146/60 146/60  Pulse:   83   Temp: 100.4 F (38 C) 100.4 F (38 C)  100.4 F (38 C)  TempSrc:    Core (Comment)  Resp: '20 22 20 19  ' Height:      Weight:      SpO2: 99% 97% 95% 95%   Filed Weights   08/26/15 0500 08/27/15 0437 08/28/15 0600  Weight: 215 lb 13.3 oz (97.9 kg) 209 lb 10.5 oz (95.1 kg) 199 lb 4.7 oz (90.4 kg)   .Body mass index is 26.3 kg/(m^2).  GENERAL: intubated on a ventilator, sedated. SKIN: no acute rashes EYES: closed. OROPHARYNX: ETT in situ NECK: no JVD, LYMPH:  no palpable lymphadenopathy in the cervical, axillary LUNGS: few b/l basal rales HEART: regular rate & rhythm,  no murmurs and bilateral lower extremity 2+ edema. ABDOMEN: abdomen soft, non-tender, normoactive bowel sounds  NEURO: sedated.   ALLERGIES:  has No Known Allergies.  MEDICATIONS:  Scheduled Meds: . sodium chloride  250 mL Intravenous Once  . acetylcysteine  4 mL Nebulization BID  . acetylcysteine      . albuterol      . antiseptic oral rinse  7 mL Mouth Rinse QID  . cefTAZidime (FORTAZ)  IV  2 g  Intravenous 3 times per day  . chlorhexidine gluconate  15 mL Mouth Rinse BID  . dexamethasone  4 mg Intravenous Q12H  . enoxaparin (LOVENOX) injection  40 mg Subcutaneous Q24H  . fentaNYL (SUBLIMAZE) injection  50 mcg Intravenous Once  . free water  300 mL Per Tube 3 times per day  . insulin aspart  0-9 Units Subcutaneous 6 times per day  . pantoprazole sodium  40 mg Per Tube Daily  . sodium chloride  1,000 mL Intravenous Once  . sodium chloride  500 mL Intravenous Once  . sodium chloride  10-40 mL Intracatheter Q12H  . vancomycin  1,000 mg Intravenous STAT   Followed by  . vancomycin  1,000 mg Intravenous Q8H   Continuous Infusions: . feeding supplement (VITAL AF 1.2 CAL) 1,000 mL (08/28/15 0450)  . fentaNYL infusion INTRAVENOUS 250 mcg/hr (08/28/15 1142)  . propofol (DIPRIVAN) infusion 20 mcg/kg/min (08/28/15 1258)   PRN Meds:.acetaminophen **OR** acetaminophen, diazepam, fentaNYL, heparin lock flush, heparin lock flush, midazolam, neomycin-bacitracin-polymyxin, [DISCONTINUED] ondansetron **OR** ondansetron (ZOFRAN) IV, sodium chloride, sodium chloride, sodium chloride  REVIEW OF SYSTEMS:    10 Point review of Systems was done is negative except as noted above.   LABORATORY DATA:  I have reviewed the data as listed  . CBC Latest Ref Rng 08/27/2015 08/26/2015 08/25/2015  WBC 4.0 - 10.5 K/uL 18.5(H) 19.3(H) 17.8(H)  Hemoglobin 13.0 -  17.0 g/dL 8.7(L) 8.4(L) 8.3(L)  Hematocrit 39.0 - 52.0 % 27.0(L) 26.0(L) 24.7(L)  Platelets 150 - 400 K/uL 399 371 355    . CMP Latest Ref Rng 08/28/2015 08/27/2015 08/26/2015  Glucose 65 - 99 mg/dL 145(H) 136(H) 150(H)  BUN 6 - 20 mg/dL 46(H) 43(H) 39(H)  Creatinine 0.61 - 1.24 mg/dL 0.67 0.78 0.78  Sodium 135 - 145 mmol/L 147(H) 147(H) 142  Potassium 3.5 - 5.1 mmol/L 4.2 4.1 4.2  Chloride 101 - 111 mmol/L 103 103 102  CO2 22 - 32 mmol/L 37(H) 37(H) 33(H)  Calcium 8.9 - 10.3 mg/dL 8.6(L) 8.3(L) 8.2(L)  Total Protein 6.5 - 8.1 g/dL - 5.1(L) -  Total  Bilirubin 0.3 - 1.2 mg/dL - 0.7 -  Alkaline Phos 38 - 126 U/L - 98 -  AST 15 - 41 U/L - 53(H) -  ALT 17 - 63 U/L - 30 -   Sedimentation rate  50<----61<---100 down from 125 CRP 2.6<--7.8<--- 19.3 down from 45.5  CMV IgG positive CMV IgM negative CMV DNA by PCR positive (qualitative)   Bronchoalveolar lavage RareWBC present both PMN and mononuclear. No squamous epithelial cells. No organisms seen.  Pneumocystis smear negative  Fungal cultures note yeast or fungal elements noted.         RADIOGRAPHIC STUDIES:ASSESSMENT & PLAN:   #1  Diffuse large B-cell lymphoma stage IV AE with involvement of mesenteric, inguinal lymph nodes and C5 vertebral body with spinal cord impingement.  He had a C5 corpectomy on 07/26/2015 that showed diffuse large B-cell lymphoma with a Ki-67 ranging from 20 to 90%.    DLBCL (diffuse large B cell lymphoma)   06/21/2015 Imaging CT Chest/abd/pelvis: IMPRESSION: 5 mm nodule within the right lower lobe as described.  Pneumobilia consistent with a prior sphincterotomy.  No acute abnormality is identified. No findings to suggest chest etiology for the known lesion at C5 are seen.   06/21/2015 Imaging MRI c spine1. Diffuse marrow replacement of the C5 vertebral body highly concerning for neoplasm (metastatic disease, myeloma, or lymphoma). Epidural tumor at this level results in moderate spinal stenosis with moderate impression on the spinal cord    07/10/2015 PET scan 1. The previously demonstrated abnormal C5 vertebral body is hypermetabolic. No other osseous lesions demonstrated. 2. There are small hypermetabolic lymph nodes within the left mesentery and both inguinal regions.    07/26/2015 Initial Biopsy Had a C5 corpectomy pathology showed diffuse large B-cell lymphoma   08/08/2015 -  Chemotherapy Started for cycle of R CHOP.    08/09/2015 -  Chemotherapy Received first cycle of intrathecal methotrexate plus hydrocortisone for CNS prophylaxis.   He is  currently s/p 1 st cycle of R-CHOP on 08/08/2015 and was scheduled for 2nd cycle of R-CHOP on 08/31/2015.   We might need to dose adjust his chemotherapy once he is better to reduce the risk of neutropenic complications since be might be hard pressed to not use any G-CSF.  Plan  He will likely need Acute inpatient rehabilitation once extubated. We will need to determine the timing and intensity of his chemotherapy based on his clinical course from this point on. Considering that he is being treated with curative intent would try to avoid >1week delay in next cycle of chemotherapy if possible.  #2 ARDS with fevers and bilateral groundglass pulmonary opacities. Noncardiogenic pulmonary edema normal BNP. Mild troponin leak due to demand ischemia which normalized.   Neulasta related ARDS versus HCAP  Overall picture appears increasingly more likely related to  Neulasta. Neulasta itself would increase the risk of ARDS like picture/pneumonitis in the setting of a lung infection/lung injury from chemotherapy and this typically presents with recovering WBC counts as with Mr. Mandigo. His Neulasta should soon be out of his system typically lasts for 12-14 days after administration on 8/19.  Blood cultures negative thus far Viral respiratory panel negative Bronchoalveolar lavage with no overt organisms seen on Gram stain. PCP negative. CMV IgG positive IgM negative PCR qualitative positive.  Elevated sedimentation rate and CRP now showing improvement which is a good sign.  Urine testing negative for Legionella antigen and Streptococcus pneumoniae.  #3 hypoxic respiratory failure due to #2 patient developed right-sided atelectasis due to mucous plugging requiring stat bronchoscopy and suctioning today with subsequent stabilization and repeat x-ray showing improvement.  #4 Anemia related to chemotherapy and lymphoma as well as possible sepsis. #5  leukocytosis likely due to Neulasta and steroids and less  likely due to ongoing sepsis . Plan -Antibiotics changed from IV Zosyn to ceftazidime plus vancomycin in the setting of low-grade fevers  -Given inflammatory markers are much improved and we will reducing dexamethasone to 4 mg IV every 12 hours. -We will have to avoid using any G-CSF in the future. -Appreciate care by the pulmonary and critical care team - on ARDS protocol mechanical ventilation down to 40% FiO2 requirement. We will likely start weaning off the ventilator soon.  -CC team working on fluid status, nutrition. -Continue close monitoring. -only irradiated blood products for transfusion. -will continue to follow up daily. -Will need aggressive inpatient acute rehabilitation due to loss of muscle mass from immobilization, steroids and lymphoma and questionable nutritional status.  Appreciate excellent critical care/ pulmonary care, hospitalist and ID input.   The total time spent  was 25 minutes and more than 50% was on counseling and direct patient cares.    Sullivan Lone MD [place] AAHIVMS [facility] Hematology/Oncology Physician [place]  (Office):       [phone] (Work cell):  [phone] (Fax):           [phone]  08/28/2015 1:15 PM

## 2015-08-28 NOTE — Procedures (Signed)
Bronchoscopy Procedure Note CLEOTIS SPARR [id number] [birth year]  Procedure: Bronchoscopy Indications: Diagnostic evaluation of the airways, Obtain specimens for culture and/or other diagnostic studies and Remove secretions  Procedure Details Consent: Risks of procedure as well as the alternatives and risks of each were explained to the (patient/caregiver).  Consent for procedure obtained. Time Out: Verified patient identification, verified procedure, site/side was marked, verified correct patient position, special equipment/implants available, medications/allergies/relevent history reviewed, required imaging and test results available.  Performed  In preparation for procedure, patient was given 100% FiO2 and bronchoscope lubricated. Sedation: Etomidate  Airway entered and the following bronchi were examined: RUL, RML, RLL and Bronchi.   Procedures performed: Brushings performed - no Bronchoscope removed.  , Patient placed back on 100% FiO2 at conclusion of procedure.    Evaluation Hemodynamic Status: BP stable throughout; O2 sats: stable throughout Patient's Current Condition: stable Specimens:  Sent purulent fluid Complications: No apparent complications Patient did tolerate procedure well.   Raylene Miyamoto. 08/28/2015 \ Acute desat, reduced bs rt pcxr no ptx, appears as collaps Stat bronch  1. Complete obstruction all lobes rt, BI, RUL thick thick thick white creamy pus, took 25 min to remove with mucomyst's, saline lavage 2. mucomyst's applied after 4 ml 20% BI  Awesome Jared J. Titus Mould, MD, [place] Pgr: [place] [facility]

## 2015-08-29 ENCOUNTER — Ambulatory Visit (HOSPITAL_COMMUNITY): Admission: RE | Admit: 2015-08-29 | Payer: PPO | Source: Ambulatory Visit

## 2015-08-29 ENCOUNTER — Inpatient Hospital Stay (HOSPITAL_COMMUNITY): Payer: Non-veteran care

## 2015-08-29 ENCOUNTER — Inpatient Hospital Stay (HOSPITAL_COMMUNITY): Admission: RE | Admit: 2015-08-29 | Payer: PPO | Source: Ambulatory Visit

## 2015-08-29 DIAGNOSIS — R509 Fever, unspecified: Secondary | ICD-10-CM

## 2015-08-29 LAB — GLUCOSE, CAPILLARY
GLUCOSE-CAPILLARY: 100 mg/dL — AB (ref 65–99)
GLUCOSE-CAPILLARY: 139 mg/dL — AB (ref 65–99)
Glucose-Capillary: 112 mg/dL — ABNORMAL HIGH (ref 65–99)
Glucose-Capillary: 115 mg/dL — ABNORMAL HIGH (ref 65–99)
Glucose-Capillary: 120 mg/dL — ABNORMAL HIGH (ref 65–99)
Glucose-Capillary: 125 mg/dL — ABNORMAL HIGH (ref 65–99)
Glucose-Capillary: 154 mg/dL — ABNORMAL HIGH (ref 65–99)

## 2015-08-29 LAB — BASIC METABOLIC PANEL
Anion gap: 5 (ref 5–15)
BUN: 49 mg/dL — AB (ref 6–20)
CHLORIDE: 106 mmol/L (ref 101–111)
CO2: 37 mmol/L — AB (ref 22–32)
CREATININE: 0.57 mg/dL — AB (ref 0.61–1.24)
Calcium: 8.4 mg/dL — ABNORMAL LOW (ref 8.9–10.3)
GFR calc non Af Amer: >60 mL/min (ref >60)
Glucose, Bld: 142 mg/dL — ABNORMAL HIGH (ref 65–99)
POTASSIUM: 4.4 mmol/L (ref 3.5–5.1)
Sodium: 148 mmol/L — ABNORMAL HIGH (ref 135–145)

## 2015-08-29 LAB — BLOOD GAS, ARTERIAL
Acid-Base Excess: 9.9 mmol/L — ABNORMAL HIGH (ref 0.0–2.0)
Bicarbonate: 34 mEq/L — ABNORMAL HIGH (ref 20.0–24.0)
DRAWN BY: 257701
FIO2: 0.4
O2 Saturation: 90 %
PEEP: 5 cmH2O
PH ART: 7.482 — AB (ref 7.350–7.450)
Patient temperature: 100.6
Pressure support: 5 cmH2O
TCO2: 31.2 mmol/L (ref 0–100)
pCO2 arterial: 46.5 mmHg — ABNORMAL HIGH (ref 35.0–45.0)
pO2, Arterial: 62.9 mmHg — ABNORMAL LOW (ref 80.0–100.0)

## 2015-08-29 LAB — CBC WITH DIFFERENTIAL/PLATELET
BASOS ABS: 0 10*3/uL (ref 0.0–0.1)
BASOS PCT: 0 % (ref 0–1)
EOS PCT: 0 % (ref 0–5)
Eosinophils Absolute: 0 10*3/uL (ref 0.0–0.7)
HEMATOCRIT: 27.2 % — AB (ref 39.0–52.0)
HEMOGLOBIN: 8.7 g/dL — AB (ref 13.0–17.0)
LYMPHS PCT: 3 % — AB (ref 12–46)
Lymphs Abs: 0.5 10*3/uL — ABNORMAL LOW (ref 0.7–4.0)
MCH: 29.9 pg (ref 26.0–34.0)
MCHC: 32 g/dL (ref 30.0–36.0)
MCV: 93.5 fL (ref 78.0–100.0)
MONOS PCT: 8 % (ref 3–12)
Monocytes Absolute: 1.4 10*3/uL — ABNORMAL HIGH (ref 0.1–1.0)
NEUTROS ABS: 16.2 10*3/uL — AB (ref 1.7–7.7)
Neutrophils Relative %: 89 % — ABNORMAL HIGH (ref 43–77)
Platelets: 410 10*3/uL — ABNORMAL HIGH (ref 150–400)
RBC: 2.91 MIL/uL — ABNORMAL LOW (ref 4.22–5.81)
RDW: 18 % — ABNORMAL HIGH (ref 11.5–15.5)
WBC: 18.1 10*3/uL — ABNORMAL HIGH (ref 4.0–10.5)

## 2015-08-29 LAB — CMV (CYTOMEGALOVIRUS) DNA ULTRAQUANT, PCR
CMV DNA Quant: POSITIVE IU/mL
Log10 CMV Qn DNA Pl: UNDETERMINED log10 IU/mL

## 2015-08-29 LAB — MAGNESIUM: Magnesium: 2.5 mg/dL — ABNORMAL HIGH (ref 1.7–2.4)

## 2015-08-29 LAB — PHOSPHORUS: PHOSPHORUS: 3.5 mg/dL (ref 2.5–4.6)

## 2015-08-29 MED ORDER — FUROSEMIDE 10 MG/ML IJ SOLN
40.0000 mg | Freq: Once | INTRAMUSCULAR | Status: AC
  Administered 2015-08-29: 40 mg via INTRAVENOUS
  Filled 2015-08-29: qty 4

## 2015-08-29 MED ORDER — ALBUTEROL SULFATE (2.5 MG/3ML) 0.083% IN NEBU
2.5000 mg | INHALATION_SOLUTION | RESPIRATORY_TRACT | Status: DC | PRN
  Administered 2015-08-29 – 2015-08-30 (×2): 2.5 mg via RESPIRATORY_TRACT
  Filled 2015-08-29 (×2): qty 3

## 2015-08-29 MED ORDER — ALBUTEROL SULFATE (2.5 MG/3ML) 0.083% IN NEBU
INHALATION_SOLUTION | RESPIRATORY_TRACT | Status: AC
  Administered 2015-08-29: 2.5 mg
  Filled 2015-08-29: qty 3

## 2015-08-29 NOTE — Progress Notes (Signed)
Nutrition Follow-up  INTERVENTION:   Diet advancement per MD RD to continue to monitor for plan  NUTRITION DIAGNOSIS:   Increased nutrient needs related to catabolic illness, cancer and cancer related treatments as evidenced by estimated needs.  Ongoing.  GOAL:   Patient will meet greater than or equal to 90% of their needs  Not meeting, NPO.  MONITOR:   Diet advancement, Labs, Weight trends, Skin, I & O's  ASSESSMENT:   69 y.o. male with a history of Diffuse Large B-Cell Lymphoma diagnosed 07/2015 on R-CHOP Chemo Rx last given on 08/09/2015 who presents to the ED with complaints of fevers and chills and cough with SOB x 2 days. He was evaluated in the ED and on chest X-ray was found to have pneumonia and an elevated lactic Acid level . A Sepsis/HCAP workup was initiated and he was placed on IV Vancomycin and Zosyn and referred for admission.   Pt extubated today. Tube feedings stopped. Pt continues NPO status. Will monitor for diet advancement.  Labs reviewed: CBGs: 100-154 Elevated Na, BUN, Mg Low Creatinine Phos WNL  Diet Order:  Diet NPO time specified Except for: Ice Chips  Skin:  Reviewed, no issues  Last BM:  9/6  Height:   Ht Readings from Last 1 Encounters:  08/23/15 6\' 1"  (1.854 m)    Weight:   Wt Readings from Last 1 Encounters:  08/29/15 195 lb 5.2 oz (88.6 kg)    Ideal Body Weight:  83.64 kg (kg)  BMI:  Body mass index is 25.78 kg/(m^2).  Estimated Nutritional Needs:   Kcal:  1800-2000  Protein:  105-115g  Fluid:  2L/day  EDUCATION NEEDS:   No education needs identified at this time  Clayton Bibles, MS, RD, LDN Pager: [phone] After Hours Pager: [phone]

## 2015-08-29 NOTE — Procedures (Signed)
Extubation Procedure Note  Patient Details:   Name: Ethan Stewart DOB: 1946/11/21 MRN: [id number]   Airway Documentation:     Evaluation  O2 sats: stable throughout Complications: No apparent complications Patient did tolerate procedure well. Bilateral Breath Sounds: Clear, Diminished Suctioning: Airway Yes  [name] 08/29/2015, 10:43 AM   Pt can whisper. Placed on 4 lpm Alex- sp02 95%.

## 2015-08-29 NOTE — Evaluation (Signed)
Physical Therapy Evaluation Patient Details Name: Ethan Stewart MRN: [id number] DOB: 1946-02-17 Today's Date: 08/29/2015   History of Present Illness  69 y.o. M with diffuse large B cell lymphoma, brought [facility] ED 8/29 with HCAP. Developed worsening hypoxia and respiratory distress, Intubated 8/30-08/29/15. H/O C 5 corpectomy for tumor with residual RUE weakness on 07/26/15.  Clinical Impression  Patient extubated today. Patient is very weak in  Extremities and  Trunk, does not move against gravity. minimally verbal. Possibly due to sedation from being ventilated. Patient has RUE weakness PTA> patient will benefit from PT to address problems listed in note below. Recommend OT consult.    Follow Up Recommendations SNF;Supervision/Assistance - 24 hour    Equipment Recommendations   (tbd)    Recommendations for Other Services   OT    Precautions / Restrictions Precautions Precautions: Fall Precaution Comments: profound weakness Required Braces or Orthoses: Cervical Brace Cervical Brace: Hard collar;At all times Restrictions Other Position/Activity Restrictions: RUE subluxation.      Mobility  Bed Mobility Overal bed mobility: Needs Assistance             General bed mobility comments: attempted sitting forward in bed but  unable without total assistof 2 to lean forward.  Transfers                 General transfer comment: NT  Ambulation/Gait                Stairs            Wheelchair Mobility    Modified Rankin (Stroke Patients Only)       Balance Overall balance assessment: Needs assistance   Sitting balance-Leahy Scale: Zero                                       Pertinent Vitals/Pain Pain Assessment: Faces Faces Pain Scale: No hurt    Home Living Family/patient expects to be discharged to:: Private residence Living Arrangements: Spouse/significant other Available Help at Discharge: Family Type of Home: House Home  Access: Stairs to enter Entrance Stairs-Rails: Psychiatric nurse of Steps: 3 Home Layout: One level Home Equipment: None      Prior Function Level of Independence: Needs assistance   Gait / Transfers Assistance Needed: ambulated Independently            Hand Dominance        Extremity/Trunk Assessment   Upper Extremity Assessment: RUE deficits/detail;LUE deficits/detail RUE Deficits / Details: weak to flex and extend fingers and wrist, trace at elbow and shoulder, noted subluxation about  Glenoid/humeral jt-placed UE an 2 pillows. unable to test sensation due to [facility].     LUE Deficits / Details: weak flexionand extention of fingers , unable to place and hold arm against gravity.   Lower Extremity Assessment: RLE deficits/detail;LLE deficits/detail RLE Deficits / Details: dorsi/plantarflexion2/5, knee extension2+, hip flexion 1+, sitting in chair position LLE Deficits / Details: same as R,   Cervical / Trunk Assessment: Other exceptions  Communication   Communication: Other (comment) (voice very ha=oarse and low.)  Cognition Arousal/Alertness: Lethargic   Overall Cognitive Status: Difficult to assess Area of Impairment: Orientation;Attention;Following commands Orientation Level: Time Current Attention Level: Alternating   Following Commands: Follows one step commands with increased time       General Comments: RN reports that patient stated  that he was ready to go home  General Comments      Exercises        Assessment/Plan    PT Assessment Patient needs continued PT services  PT Diagnosis Difficulty walking;Generalized weakness   PT Problem List Decreased strength;Decreased activity tolerance;Decreased balance;Decreased mobility;Decreased cognition;Decreased knowledge of use of DME;Decreased safety awareness;Decreased knowledge of precautions;Cardiopulmonary status limiting activity  PT Treatment Interventions DME instruction;Functional  mobility training;Therapeutic activities;Therapeutic exercise;Patient/family education;Balance training;Neuromuscular re-education   PT Goals (Current goals can be found in the Care Plan section) Acute Rehab PT Goals Patient Stated Goal: per wife, to get stronger. PT Goal Formulation: With patient/family Time For Goal Achievement: 09/12/15 Potential to Achieve Goals: Good    Frequency Min 3X/week   Barriers to discharge        Co-evaluation               End of Session   Activity Tolerance: Patient limited by lethargy Patient left: in bed;with call bell/phone within reach;with bed alarm set;with family/visitor present Nurse Communication: Mobility status;Need for lift equipment         Time: 9030-0923 PT Time Calculation (min) (ACUTE ONLY): 37 min   Charges:   PT Evaluation $Initial PT Evaluation Tier I: 1 Procedure PT Treatments $Therapeutic Activity: 8-22 mins   PT G Codes:        Ethan Stewart PT [phone]  08/29/2015, 5:02 PM

## 2015-08-29 NOTE — Progress Notes (Signed)
Patient ID: Ethan Stewart, male   DOB: [birth year], 70 y.o.   MRN: [id number]         [facility]    Date of Admission:  08/20/2015           Day 9 of antibiotics        Day 1 vancomycin and ceftazidime  Principal Problem:   HCAP (healthcare-associated pneumonia) Active Problems:   DLBCL (diffuse large B cell lymphoma)   Sepsis   Anemia associated with chemotherapy   Sinus tachycardia   GERD (gastroesophageal reflux disease)   Blood poisoning   Hypoxia   Acute on chronic respiratory failure with hypoxia   ARDS (adult respiratory distress syndrome)   Acute respiratory failure   . sodium chloride  250 mL Intravenous Once  . acetylcysteine  4 mL Nebulization BID  . antiseptic oral rinse  7 mL Mouth Rinse QID  . cefTAZidime (FORTAZ)  IV  2 g Intravenous 3 times per day  . chlorhexidine gluconate  15 mL Mouth Rinse BID  . dexamethasone  4 mg Intravenous Q12H  . enoxaparin (LOVENOX) injection  40 mg Subcutaneous Q24H  . fentaNYL (SUBLIMAZE) injection  50 mcg Intravenous Once  . free water  300 mL Per Tube 3 times per day  . insulin aspart  0-9 Units Subcutaneous 6 times per day  . pantoprazole sodium  40 mg Per Tube Daily  . sodium chloride  1,000 mL Intravenous Once  . sodium chloride  500 mL Intravenous Once  . sodium chloride  10-40 mL Intracatheter Q12H  . vancomycin  1,000 mg Intravenous Q8H   Review of Systems: Review of systems not obtained due to patient factors.  Past Medical History  Diagnosis Date  . History of bleeding ulcers 1990's  . 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 Anxiety   . GERD (gastroesophageal reflux disease)   . H. pylori infection   . History of blood transfusion   . Bone cancer      tumor on C5  . Arm numbness 08/07/15    Limited movement due to tumor on spine  . Diffuse large B cell lymphoma     biposy 07/25/2105    Social History  Substance Use Topics  . Smoking status: Former Smoker -- 1.00 packs/day for 10  years    Types: Cigarettes    Quit date: 12/23/1981  . Smokeless tobacco: Never Used  . Alcohol Use: 3.0 oz/week    2 Glasses of wine, 3 Cans of beer per week    Family History  Problem Relation Age of Onset  . Hypertension Mother    No Known Allergies  OBJECTIVE: Filed Vitals:   08/29/15 1200 08/29/15 1300 08/29/15 1400 08/29/15 1500  BP: 141/67 168/94 157/63 145/61  Pulse:      Temp: 101.1 F (38.4 C) 97.3 F (36.3 C) 101.1 F (38.4 C)   TempSrc: Core (Comment)     Resp: 20 19 23 22   Height:      Weight:      SpO2: 97% 97% 100% 99%   Body mass index is 25.78 kg/(m^2).  General: He remains on the ventilator. Skin: No rash Lungs: Clear anteriorly Cor: Regular S1 and S2 with no murmur Abdomen: Soft with quiet bowel sounds  Lab Results Lab Results  Component Value Date   WBC 18.1* 08/29/2015   HGB 8.7* 08/29/2015   HCT 27.2* 08/29/2015   MCV 93.5 08/29/2015   PLT 410* 08/29/2015  Lab Results  Component Value Date   CREATININE 0.57* 08/29/2015   BUN 49* 08/29/2015   NA 148* 08/29/2015   K 4.4 08/29/2015   CL 106 08/29/2015   CO2 37* 08/29/2015    Lab Results  Component Value Date   ALT 30 08/27/2015   AST 53* 08/27/2015   ALKPHOS 98 08/27/2015   BILITOT 0.7 08/27/2015     Microbiology: Recent Results (from the past 240 hour(s))  Blood culture (routine x 2)     Status: None   Collection Time: 08/20/15 10:41 PM  Result Value Ref Range Status   Specimen Description BLOOD LEFT ANTECUBITAL  Final   Special Requests BOTTLES DRAWN AEROBIC AND ANAEROBIC 5ML  Final   Culture   Final    NO GROWTH 5 DAYS Performed at [facility]    Report Status 08/26/2015 FINAL  Final  Urine culture     Status: None   Collection Time: 08/21/15 12:59 AM  Result Value Ref Range Status   Specimen Description URINE, RANDOM  Final   Special Requests Immunocompromised  Final   Culture   Final    NO GROWTH 1 DAY Performed at [facility]    Report  Status 08/22/2015 FINAL  Final  Culture, blood (routine x 2)     Status: None   Collection Time: 08/21/15  5:25 PM  Result Value Ref Range Status   Specimen Description BLOOD LEFT HAND  Final   Special Requests IN PEDIATRIC BOTTLE  4CC  Final   Culture   Final    NO GROWTH 5 DAYS Performed at [facility]    Report Status 08/26/2015 FINAL  Final  Respiratory virus panel     Status: None   Collection Time: 08/21/15  5:26 PM  Result Value Ref Range Status   Source - RVPAN NOSE  Corrected   Respiratory Syncytial Virus A Negative Negative Final   Respiratory Syncytial Virus B Negative Negative Final   Influenza A Negative Negative Final   Influenza B Negative Negative Final   Parainfluenza 1 Negative Negative Final   Parainfluenza 2 Negative Negative Final   Parainfluenza 3 Negative Negative Final   Metapneumovirus Negative Negative Final   Rhinovirus Negative Negative Final   Adenovirus Negative Negative Final    Comment: (NOTE) Performed At: [facility] [place], [place] [id number] Lindon Romp MD JJ:[id number]   Culture, blood (routine x 2)     Status: None   Collection Time: 08/21/15  5:35 PM  Result Value Ref Range Status   Specimen Description BLOOD LEFT HAND  Final   Special Requests BOTTLES DRAWN AEROBIC AND ANAEROBIC  6CC  Final   Culture   Final    NO GROWTH 5 DAYS Performed at [facility]    Report Status 08/26/2015 FINAL  Final  Culture, bal-quantitative     Status: None   Collection Time: 08/22/15 12:48 PM  Result Value Ref Range Status   Specimen Description BRONCHIAL ALVEOLAR LAVAGE  Final   Special Requests Immunocompromised  Final   Gram Stain   Final    RARE WBC PRESENT,BOTH PMN AND MONONUCLEAR NO SQUAMOUS EPITHELIAL CELLS SEEN NO ORGANISMS SEEN Performed at [facility] NO GROWTH Performed at [facility]   Final   Culture   Final    NO GROWTH 2 Note: CORRECTED RESULTS  CALLED TO: RUBY WL 9 16@1130  BY PARDA Performed at [facility]  Report Status 08/25/2015 FINAL  Final  Fungus Culture with Smear     Status: None (Preliminary result)   Collection Time: 08/22/15 12:48 PM  Result Value Ref Range Status   Specimen Description BRONCHIAL ALVEOLAR LAVAGE  Final   Special Requests Immunocompromised  Final   Fungal Smear   Final    NO YEAST OR FUNGAL ELEMENTS SEEN Performed at [facility]    Culture   Final    CULTURE IN PROGRESS FOR FOUR WEEKS Performed at [facility]    Report Status PENDING  Incomplete  Pneumocystis smear by DFA     Status: None   Collection Time: 08/22/15 12:49 PM  Result Value Ref Range Status   Specimen Source-PJSRC BRONCHIAL ALVEOLAR LAVAGE  Final   Pneumocystis jiroveci Ag NEGATIVE  Final    Comment: Performed at [facility]  Respiratory virus panel     Status: None   Collection Time: 08/23/15 11:47 PM  Result Value Ref Range Status   Respiratory Syncytial Virus A Negative Negative Final   Respiratory Syncytial Virus B Negative Negative Final   Influenza A Negative Negative Final   Influenza B Negative Negative Final   Parainfluenza 1 Negative Negative Final   Parainfluenza 2 Negative Negative Final   Parainfluenza 3 Negative Negative Final   Metapneumovirus Negative Negative Final   Rhinovirus Negative Negative Final   Adenovirus Negative Negative Final    Comment: (NOTE) Performed At: [facility] [place], [place] [id number] Lindon Romp MD ON:[id number]   Culture, respiratory (NON-Expectorated)     Status: None (Preliminary result)   Collection Time: 08/28/15 10:54 AM  Result Value Ref Range Status   Specimen Description TRACHEAL ASPIRATE  Final   Special Requests NONE  Final   Gram Stain   Final    FEW WBC PRESENT,BOTH PMN AND MONONUCLEAR RARE SQUAMOUS EPITHELIAL CELLS PRESENT YEAST Performed at [facility]    Culture   Final     NO GROWTH 1 DAY Performed at [facility]    Report Status PENDING  Incomplete    ASSESSMENT: He has had increasing fever and worsening infiltrates recently. He underwent bronchoscopy yesterday. Chest x-ray looks much better today. His antibiotics were changed yesterday. Yeast was seen on BAL fluid stains. Cultures are pending.  PLAN: 1. Continue vancomycin and ceftazidime pending BAL cultures 2. Repeat blood and urine cultures if fevers persist  Michel Bickers, MD [facility] [place] [phone] pager   [phone] cell 08/29/2015, 4:45 PM

## 2015-08-29 NOTE — Progress Notes (Signed)
210cc of 2513mcg in 273ml Fentanyl gtt wasted in sink with Prince Rome, RN.   Dorrene German, RN

## 2015-08-29 NOTE — Progress Notes (Signed)
Date:  Sept.6, 2016 U.R. performed for needs and level of care. Remains on full vent support Will continue to follow for Case Management needs.  Velva Harman, RN, BSN, [place]   [phone]

## 2015-08-29 NOTE — Consult Note (Signed)
WOC wound consult note Reason for Consult:Sacral blister Wound type:pressure Pressure Ulcer POA: No Measurement:Affected area measures 11cm x 6cm with dark purple center over coccyx measuring 3cm x 2cm.  Left buttock with 3.5cm x 5.5cm x 0.2cm unroofed blister (partial thickness, red, moist with serous exudate) and right buttock with 3cm x 1.5cm intact blister (partial; thickness) Wound bed:As described above Drainage (amount, consistency, odor) As described above Periwound:Intact, erythematous in perianal with satellite lesions consistent with fungal overgrowth.  Patient is incontinence of dark green soft feces at time of assessment. He has an IUC for urinary IC.  Assessment is delayed slightly as he is receiving pulmonary toilet at time of visit. (He was extubated earlier today.) Dressing procedure/placement/frequency: A soft silicone foam dressing is in place, but has to be changed due to soiling.  Turning from side to side and avoiding the supine position is in place; microshifts have been ineffective at avoiding coccyxgeal pressure. Patient is beginning to work with PT, but unfortunately, was not able to participate much today. Nursing staff will provide timely incontinence care using our house products and notify MD if the need for systemic antifungal becomes apparent at next rounds. Expect reepithelialization of two partial thickness areas and would expect full thickness tissue loss in the center (beneath the purple discoloration) unless we can reverse the effects of shear and pressure in combination with moisture. [facility] nursing team will not follow routinely, but will remain available to this patient, the nursing and medical teams.  Please re-consult if needed in-between visits. Thanks, Maudie Flakes, MSN, RN, [place], [place], [place] [phone])

## 2015-08-29 NOTE — Progress Notes (Signed)
PULMONARY / CRITICAL CARE MEDICINE   Name: Ethan Stewart MRN: [id number] DOB: 1946/07/13    ADMISSION DATE:  08/20/2015 CONSULTATION DATE:  08/29/2015  REFERRING MD : Wynelle Cleveland  CHIEF COMPLAINT: SOB  INITIAL PRESENTATION: 69 y.o. M with diffuse large B cell lymphoma, brought to [facility] ED 8/29 with HCAP. Developed worsening hypoxia and respiratory distress later that evening; therefore, PCCM called for further recs..   STUDIES:  CXR 8/29 >>> worsened central lung alveolar opacities bilaterally, suspicious for infectious infiltrates. CTA chest 8/29 >>> b/l airspace disease c/w atypical PNA or non-cardiogenic edema. No PE. CXR 9/2 >> sub cut emphysema RT neck  SIGNIFICANT EVENTS: 8/29 - admitted 8/30 intubated, ARDS protocol 9/3- off pressors, diuresis neg 2.2 liters  SUBJECTIVE:  Febrile Good UO No obvious pain No secretions  VITAL SIGNS: Temp:  [99 F (37.2 C)-101.7 F (38.7 C)] 100.2 F (37.9 C) (09/06 0800) Pulse Rate:  [76-102] 76 (09/06 0326) Resp:  [18-54] 19 (09/06 0800) BP: (73-179)/(45-92) 179/92 mmHg (09/06 0800) SpO2:  [85 %-100 %] 100 % (09/06 0800) FiO2 (%):  [40 %] 40 % (09/06 0909) Weight:  [88.6 kg (195 lb 5.2 oz)] 88.6 kg (195 lb 5.2 oz) (09/06 0500) HEMODYNAMICS:   VENTILATOR SETTINGS: Vent Mode:  [-] CPAP;PSV FiO2 (%):  [40 %] 40 % Set Rate:  [20 bmp] 20 bmp Vt Set:  [480 mL] 480 mL PEEP:  [8 cmH20] 8 cmH20 Pressure Support:  [5 cmH20] 5 cmH20 Plateau Pressure:  [10 cmH20-18 cmH20] 10 cmH20 INTAKE / OUTPUT: Intake/Output      09/05 0701 - 09/06 0700 09/06 0701 - 09/07 0700   I.V. (mL/kg) 838.1 (9.5) 78.1 (0.9)   NG/GT 1440 120   IV Piggyback 1200    Total Intake(mL/kg) 3478.1 (39.3) 198.1 (2.2)   Urine (mL/kg/hr) 2935 (1.4) 285 (1.4)   Total Output 2935 285   Net +543.1 -86.9          PHYSICAL EXAMINATION: General: Male of normal body habitus intubated, collar Neuro: RASS 0, follows commands HEENT: PERRL  Cardiovascular: RRR, no  MRG Lungs: coarse diffuse, mild anterior Abdomen: Soft, non-tender, non-distended, no r/g Musculoskeletal: No acute deformity Skin: Grossly intact  LABS:  CBC  Recent Labs Lab 08/26/15 0450 08/27/15 0451 08/29/15 0310  WBC 19.3* 18.5* 18.1*  HGB 8.4* 8.7* 8.7*  HCT 26.0* 27.0* 27.2*  PLT 371 399 410*   Coag's No results for input(s): APTT, INR in the last 168 hours. BMET  Recent Labs Lab 08/27/15 0451 08/28/15 0416 08/29/15 0310  NA 147* 147* 148*  K 4.1 4.2 4.4  CL 103 103 106  CO2 37* 37* 37*  BUN 43* 46* 49*  CREATININE 0.78 0.67 0.57*  GLUCOSE 136* 145* 142*   Electrolytes  Recent Labs Lab 08/25/15 0430  08/27/15 0451 08/28/15 0416 08/29/15 0310  CALCIUM 8.3*  < > 8.3* 8.6* 8.4*  MG 2.7*  --   --  2.6* 2.5*  PHOS 3.3  --   --  3.8 3.5  < > = values in this interval not displayed. Sepsis Markers No results for input(s): LATICACIDVEN, PROCALCITON, O2SATVEN in the last 168 hours. ABG  Recent Labs Lab 08/23/15 0400 08/24/15 0315 08/28/15 1606  PHART 7.393 7.319* 7.469*  PCO2ART 36.2 47.8* 48.9*  PO2ART 74.1* 66.6* 86.6   Liver Enzymes  Recent Labs Lab 08/23/15 0350 08/27/15 0451  AST 84* 53*  ALT 37 30  ALKPHOS 123 98  BILITOT <0.1* 0.7  ALBUMIN 1.7* 2.1*  Cardiac Enzymes No results for input(s): TROPONINI, PROBNP in the last 168 hours. Glucose  Recent Labs Lab 08/28/15 1154 08/28/15 1535 08/28/15 1953 08/28/15 2359 08/29/15 0359 08/29/15 0824  GLUCAP 136* 136* 116* 139* 154* 100*    Imaging Dg Chest Port 1 View  08/29/2015   CLINICAL DATA:  ARDS.  EXAM: PORTABLE CHEST - 1 VIEW  COMPARISON:  08/28/2015.  FINDINGS: Endotracheal tube, NG tube, left PICC line in stable position. Heart size stable. Diffuse bilateral airspace disease with basilar atelectasis noted. No pleural effusion pneumothorax. Right chest wall subcutaneous emphysema again noted .  IMPRESSION: 1. Lines and tubes in stable position. 2. Diffuse bilateral airspace  disease with bibasilar atelectasis. 3. Right chest wall subcutaneous emphysema again noted. No pneumothorax.   Electronically Signed   By: Marcello Moores  Register   On: 08/29/2015 07:10   Dg Chest Port 1 View  08/28/2015   CLINICAL DATA:  69 year old male with increasing oxygen requirements and known subcutaneous emphysema. Evaluate for pneumothorax.  EXAM: PORTABLE CHEST - 1 VIEW  COMPARISON:  Prior chest x-ray earlier today 08/28/2015 1:39 a.m.  FINDINGS: Tracheostomy tube is 4.8 cm above the carina. Left upper extremity PICC remains in good position with the tip overlying the superior cavoatrial junction. A nasogastric tube is present. The tip overlies the upper stomach in unchanged position.  Persistent mediastinal air with increasing subcutaneous emphysema in the soft tissues of the right neck and shoulder. Additionally, there is progressive opacification of the right lung base with developing air bronchograms. There is associated volume loss. This is favored to represent atelectasis. Aeration in the left lung base has improved compared to the prior imaging. No acute osseous abnormality.  IMPRESSION: 1. Progressive opacification and volume loss in the right lower lobe concerning for increasing atelectasis versus developing infiltrate. Given the rapid interval change, atelectasis is strongly favored. 2. No evidence of pneumothorax although there is increasing subcutaneous emphysema in the soft tissues of the right neck, shoulder and lateral chest. 3. Improved aeration in the left lung base likely reflecting resolving left basilar atelectasis. 4. Stable and satisfactory support apparatus.   Electronically Signed   By: Jacqulynn Cadet M.D.   On: 08/28/2015 09:58   Dg Chest Port 1v Same Day  08/28/2015   CLINICAL DATA:  Status post bronchoscopy. Evaluate for pneumothorax.  EXAM: PORTABLE CHEST - 1 VIEW SAME DAY  COMPARISON:  08/28/2015 and prior exams  FINDINGS: An endotracheal tube with tip 3.5 cm above the carina,  NG tube with tip overlying the proximal stomach and left PICC line with tip overlying the lower SVC again noted.  Right lung airspace disease again noted with peak right lower lung airspace disease/atelectasis.  Mild left basilar atelectasis/ airspace disease again noted.  There is no evidence of pneumothorax.  Right subcutaneous emphysema is again identified.  IMPRESSION: No evidence of pneumothorax status post bronchoscopy.  Decreased right lower lung atelectasis/ airspace disease. No other significant change.   Electronically Signed   By: Margarette Canada M.D.   On: 08/28/2015 11:26     ASSESSMENT / PLAN:  PULMONARY A: ETT 8/30 >> ARDS HCAP- cx neg. DD incl G-CSF related pneumonitis Barotrauma - sub cut emphysema 9/2 Pneumomediastinum from ards P:  ards protocol remain ,SBTs with goal extubation pcxr again for air noted, no ptx noted and he is improving , so will try to avoid chest tubes Reduce steroids to q12h  CARDIOVASCULAR A:  Mild troponin leak - suspect demand ischemia. Hypotension - due to  PEEP/sedatives HTN 9/5 off sedation P:  cvp 4 goal met  Lasix cotninued Tele   RENAL A: Hypernatremia P:  kvo Add free water cvp goal 4 met  GASTROINTESTINAL A:  GERD. Protein calorie malnutrition. Some loose stools increased, r/o cdiff sent but since sent then no stool noted - NOT a clinical circumstance of cdiff now P:  Pantoprazole Hold TFs  In anticipation of extubation Ensure vital used with stools  HEMATOLOGIC / ONCOLOGIC A:  Diffuse large B cell lymphoma (follows with Dr. Vivien Rossetti) s/p 1 cycle of R-CHOP Anemia. VTE Prophylaxis. P:  SCD's / Lovenox.  INFECTIOUS A:  HCAP / ? Atypical PNA. NO stools now since 2 days P:  BCx2 8/29 > ng UCx 8/29 >ng Sputum Cx 8/29 > PCP stain 8/29 >neg U. Legionella 8/29 >neg U. Strep 8/29 >neg RVP 8/29 > nasal swab >> neg RVP 8/30 BAL >>neg Abx: Zosyn, start date 8/29 >> Abx: Azithro, start date 8/29  >>9/2 Abx: Bactrim, start date 8/29 >> 8/31 Vanc 8/29 >>9/1   zosyn per ID  send cdiff if stool re occur  ENDOCRINE A:  At risk for steroid induced hyperglycemia.  P:  SSI -sensitive  NEUROLOGIC A:  Anxiety on valium Hx C5 tumor - s/p C5 corpectomy with C4-C6 arthrodesis and anterior cervical plating of C4-C6 (Dr. Ronnald Ramp - 07/26/15). P: fent / prop WUA ,mandatory PT consulted, be aggressive if remains on vent   Code Status: Full.  Family updated: 9/1-9/5 wife updated daily  Interdisciplinary Family Meeting v Palliative Care Meeting: done  Summary - ARDS resolving with barotrauma, hope to extubate soon  The patient is critically ill with multiple organ systems failure and requires high complexity decision making for assessment and support, frequent evaluation and titration of therapies, application of advanced monitoring technologies and extensive interpretation of multiple databases. Critical Care Time devoted to patient care services described in this note independent of APP time is 35 minutes.    Kara Mead MD. [place] Flood. [facility] Pager [phone] If no response call 319 [phone]

## 2015-08-30 ENCOUNTER — Inpatient Hospital Stay (HOSPITAL_COMMUNITY): Payer: Non-veteran care

## 2015-08-30 DIAGNOSIS — B379 Candidiasis, unspecified: Secondary | ICD-10-CM

## 2015-08-30 DIAGNOSIS — R29898 Other symptoms and signs involving the musculoskeletal system: Secondary | ICD-10-CM

## 2015-08-30 DIAGNOSIS — M6250 Muscle wasting and atrophy, not elsewhere classified, unspecified site: Secondary | ICD-10-CM

## 2015-08-30 DIAGNOSIS — R410 Disorientation, unspecified: Secondary | ICD-10-CM

## 2015-08-30 DIAGNOSIS — E46 Unspecified protein-calorie malnutrition: Secondary | ICD-10-CM

## 2015-08-30 DIAGNOSIS — L8990 Pressure ulcer of unspecified site, unspecified stage: Secondary | ICD-10-CM | POA: Insufficient documentation

## 2015-08-30 LAB — VANCOMYCIN, TROUGH: VANCOMYCIN TR: 17 ug/mL (ref 10.0–20.0)

## 2015-08-30 LAB — GLUCOSE, CAPILLARY
GLUCOSE-CAPILLARY: 113 mg/dL — AB (ref 65–99)
GLUCOSE-CAPILLARY: 115 mg/dL — AB (ref 65–99)
GLUCOSE-CAPILLARY: 99 mg/dL (ref 65–99)
Glucose-Capillary: 171 mg/dL — ABNORMAL HIGH (ref 65–99)
Glucose-Capillary: 165 mg/dL — ABNORMAL HIGH (ref 65–99)

## 2015-08-30 LAB — BASIC METABOLIC PANEL
ANION GAP: 7 (ref 5–15)
BUN: 45 mg/dL — ABNORMAL HIGH (ref 6–20)
CO2: 32 mmol/L (ref 22–32)
Calcium: 8.4 mg/dL — ABNORMAL LOW (ref 8.9–10.3)
Chloride: 107 mmol/L (ref 101–111)
Creatinine, Ser: 0.68 mg/dL (ref 0.61–1.24)
Glucose, Bld: 157 mg/dL — ABNORMAL HIGH (ref 65–99)
POTASSIUM: 3.7 mmol/L (ref 3.5–5.1)
SODIUM: 146 mmol/L — AB (ref 135–145)

## 2015-08-30 LAB — CBC
HEMATOCRIT: 25.1 % — AB (ref 39.0–52.0)
HEMOGLOBIN: 7.9 g/dL — AB (ref 13.0–17.0)
MCH: 28.8 pg (ref 26.0–34.0)
MCHC: 31.5 g/dL (ref 30.0–36.0)
MCV: 91.6 fL (ref 78.0–100.0)
Platelets: 359 10*3/uL (ref 150–400)
RBC: 2.74 MIL/uL — AB (ref 4.22–5.81)
RDW: 17.9 % — ABNORMAL HIGH (ref 11.5–15.5)
WBC: 18.1 10*3/uL — AB (ref 4.0–10.5)

## 2015-08-30 LAB — CULTURE, RESPIRATORY

## 2015-08-30 LAB — PREPARE RBC (CROSSMATCH)

## 2015-08-30 LAB — CULTURE, RESPIRATORY W GRAM STAIN

## 2015-08-30 MED ORDER — DEXAMETHASONE SODIUM PHOSPHATE 4 MG/ML IJ SOLN
2.0000 mg | Freq: Two times a day (BID) | INTRAMUSCULAR | Status: DC
  Administered 2015-08-30 (×2): 2 mg via INTRAVENOUS
  Filled 2015-08-30 (×2): qty 1

## 2015-08-30 MED ORDER — SODIUM CHLORIDE 0.9 % IJ SOLN
10.0000 mL | INTRAMUSCULAR | Status: DC | PRN
Start: 2015-08-30 — End: 2015-09-05

## 2015-08-30 MED ORDER — RESOURCE THICKENUP CLEAR PO POWD
ORAL | Status: DC | PRN
  Filled 2015-08-30 (×2): qty 125

## 2015-08-30 MED ORDER — SODIUM CHLORIDE 0.9 % IJ SOLN: 3.0000 mL | INTRAMUSCULAR | Status: DC | PRN

## 2015-08-30 MED ORDER — SODIUM CHLORIDE 0.9 % IV SOLN: Freq: Once | INTRAVENOUS | Status: DC

## 2015-08-30 MED ORDER — SODIUM CHLORIDE 0.9 % IV SOLN
250.0000 mL | Freq: Once | INTRAVENOUS | Status: AC
Start: 2015-08-30 — End: 2015-09-01
  Administered 2015-08-30: 250 mL via INTRAVENOUS

## 2015-08-30 MED ORDER — HEPARIN SOD (PORK) LOCK FLUSH 100 UNIT/ML IV SOLN
250.0000 [IU] | INTRAVENOUS | Status: DC | PRN
  Filled 2015-08-30: qty 3

## 2015-08-30 MED ORDER — CHLORHEXIDINE GLUCONATE 0.12 % MT SOLN
OROMUCOSAL | Status: AC
  Filled 2015-08-30: qty 15

## 2015-08-30 MED ORDER — HEPARIN SOD (PORK) LOCK FLUSH 100 UNIT/ML IV SOLN
500.0000 [IU] | Freq: Every day | INTRAVENOUS | Status: DC | PRN
  Filled 2015-08-30: qty 5

## 2015-08-30 MED ORDER — FUROSEMIDE 10 MG/ML IJ SOLN
20.0000 mg | Freq: Once | INTRAMUSCULAR | Status: AC
  Administered 2015-08-30: 20 mg via INTRAVENOUS
  Filled 2015-08-30: qty 2

## 2015-08-30 NOTE — Progress Notes (Signed)
   08/30/15 1700  Clinical Encounter Type  Visited With Patient and family together  Visit Type Follow-up;Social support  Referral From Nurse  Consult/Referral To None  Recommendations Intern will continue to follow up with pt and family regarding spiritual care and emotional needs  Spiritual Encounters  Spiritual Needs Emotional  Stress Factors  Patient Stress Factors Exhausted;Loss of control;Major life changes;Health changes  Family Stress Factors Exhausted;Loss of control   Counseling intern followed up with pt and pt's wife for spiritual care/emotional consult. Pt reported feelings of hopelessness, stating that he "just want[s] to go home" and that he feels "there's no hope." When asked what does provide hope or inner strength, the pt replied "nothing - I just want to go home." The pt had a flat affect. Pt's wife indicated that she was feeling "exhausted" but was being strong for her husband. She also attempted to inspire hope in him by discussing what he likes at home ("his dog," "chocolate ice cream") and mentioned that she feels he will go home, it is just a matter of when and how much he takes control of his wellness. The pt's wife indicated that he will do this by gaining the strength to move to a different unit or return home. Counselor processed some feelings of exhaustion, frustration, and hopelessness with the pt and his wife as well as some feelings of relief from the pt's wife that he was taken off the ventilator and is now breathing on his own. Counselor provided brief spiritual and emotional support to the pt and his wife. Counselor will follow up with pt and his wife on Friday 9/9.   [place]

## 2015-08-30 NOTE — Progress Notes (Signed)
Patient ID: Ethan Stewart, male   DOB: [birth year], 69 y.o.   MRN: [id number]         [facility]    Date of Admission:  08/20/2015           Day 10 of antibiotics        Day 2 vancomycin and ceftazidime  Principal Problem:   HCAP (healthcare-associated pneumonia) Active Problems:   DLBCL (diffuse large B cell lymphoma)   Sepsis   Anemia associated with chemotherapy   Sinus tachycardia   GERD (gastroesophageal reflux disease)   Blood poisoning   Hypoxia   Acute on chronic respiratory failure with hypoxia   ARDS (adult respiratory distress syndrome)   Acute respiratory failure   Pressure ulcer   . sodium chloride   Intravenous Once  . antiseptic oral rinse  7 mL Mouth Rinse QID  . cefTAZidime (FORTAZ)  IV  2 g Intravenous 3 times per day  . chlorhexidine gluconate  15 mL Mouth Rinse BID  . dexamethasone  2 mg Intravenous Q12H  . enoxaparin (LOVENOX) injection  40 mg Subcutaneous Q24H  . insulin aspart  0-9 Units Subcutaneous 6 times per day  . pantoprazole sodium  40 mg Per Tube Daily  . sodium chloride  1,000 mL Intravenous Once  . sodium chloride  500 mL Intravenous Once  . sodium chloride  10-40 mL Intracatheter Q12H  . vancomycin  1,000 mg Intravenous Q8H   SUBJECTIVE: He tells me that he is doing a little bit better today. However, earlier today he told his nurse that he was tired of living this way.  Review of Systems: Review of systems not obtained due to patient factors.  Past Medical History  Diagnosis Date  . History of bleeding ulcers 1990's  . 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 Anxiety   . GERD (gastroesophageal reflux disease)   . H. pylori infection   . History of blood transfusion   . Bone cancer      tumor on C5  . Arm numbness 08/07/15    Limited movement due to tumor on spine  . Diffuse large B cell lymphoma     biposy 07/25/2105    Social History  Substance Use Topics  . Smoking status: Former Smoker -- 1.00  packs/day for 10 years    Types: Cigarettes    Quit date: 12/23/1981  . Smokeless tobacco: Never Used  . Alcohol Use: 3.0 oz/week    2 Glasses of wine, 3 Cans of beer per week    Family History  Problem Relation Age of Onset  . Hypertension Mother    No Known Allergies  OBJECTIVE: Filed Vitals:   08/30/15 1510 08/30/15 1600 08/30/15 1622 08/30/15 1630  BP:  122/63 121/66   Pulse:      Temp: 100.2 F (37.9 C) 100.2 F (37.9 C) 100.2 F (37.9 C) 99.1 F (37.3 C)  TempSrc:      Resp: 23 39 39 44  Height:      Weight:      SpO2: 100% 100% 100% 100%   Body mass index is 25.95 kg/(m^2).  General: He is now off the ventilator Skin: No rash. IV site okay Lungs: Clear anteriorly Cor: Regular S1 and S2 with no murmur Abdomen: Soft with quiet bowel sounds. No diarrhea. Stage II sacral pressure sore without evidence of infection Mood: Withdrawn with flat affect  Lab Results Lab Results  Component Value Date   WBC 18.1* 08/30/2015  HGB 7.9* 08/30/2015   HCT 25.1* 08/30/2015   MCV 91.6 08/30/2015   PLT 359 08/30/2015    Lab Results  Component Value Date   CREATININE 0.68 08/30/2015   BUN 45* 08/30/2015   NA 146* 08/30/2015   K 3.7 08/30/2015   CL 107 08/30/2015   CO2 32 08/30/2015    Lab Results  Component Value Date   ALT 30 08/27/2015   AST 53* 08/27/2015   ALKPHOS 98 08/27/2015   BILITOT 0.7 08/27/2015     Microbiology: Recent Results (from the past 240 hour(s))  Blood culture (routine x 2)     Status: None   Collection Time: 08/20/15 10:41 PM  Result Value Ref Range Status   Specimen Description BLOOD LEFT ANTECUBITAL  Final   Special Requests BOTTLES DRAWN AEROBIC AND ANAEROBIC 5ML  Final   Culture   Final    NO GROWTH 5 DAYS Performed at [facility]    Report Status 08/26/2015 FINAL  Final  Urine culture     Status: None   Collection Time: 08/21/15 12:59 AM  Result Value Ref Range Status   Specimen Description URINE, RANDOM  Final    Special Requests Immunocompromised  Final   Culture   Final    NO GROWTH 1 DAY Performed at [facility]    Report Status 08/22/2015 FINAL  Final  Culture, blood (routine x 2)     Status: None   Collection Time: 08/21/15  5:25 PM  Result Value Ref Range Status   Specimen Description BLOOD LEFT HAND  Final   Special Requests IN PEDIATRIC BOTTLE  4CC  Final   Culture   Final    NO GROWTH 5 DAYS Performed at [facility]    Report Status 08/26/2015 FINAL  Final  Respiratory virus panel     Status: None   Collection Time: 08/21/15  5:26 PM  Result Value Ref Range Status   Source - RVPAN NOSE  Corrected   Respiratory Syncytial Virus A Negative Negative Final   Respiratory Syncytial Virus B Negative Negative Final   Influenza A Negative Negative Final   Influenza B Negative Negative Final   Parainfluenza 1 Negative Negative Final   Parainfluenza 2 Negative Negative Final   Parainfluenza 3 Negative Negative Final   Metapneumovirus Negative Negative Final   Rhinovirus Negative Negative Final   Adenovirus Negative Negative Final    Comment: (NOTE) Performed At: [facility] [address] [place], [place] [id number] Lindon Romp MD YJ:[id number]   Culture, blood (routine x 2)     Status: None   Collection Time: 08/21/15  5:35 PM  Result Value Ref Range Status   Specimen Description BLOOD LEFT HAND  Final   Special Requests BOTTLES DRAWN AEROBIC AND ANAEROBIC  6CC  Final   Culture   Final    NO GROWTH 5 DAYS Performed at [facility]    Report Status 08/26/2015 FINAL  Final  Culture, bal-quantitative     Status: None   Collection Time: 08/22/15 12:48 PM  Result Value Ref Range Status   Specimen Description BRONCHIAL ALVEOLAR LAVAGE  Final   Special Requests Immunocompromised  Final   Gram Stain   Final    RARE WBC PRESENT,BOTH PMN AND MONONUCLEAR NO SQUAMOUS EPITHELIAL CELLS SEEN NO ORGANISMS SEEN Performed at [facility]     Colony Count NO GROWTH Performed at [facility]   Final   Culture   Final  NO GROWTH 2 Note: CORRECTED RESULTS CALLED TO: RUBY WL 9 16@1130  BY PARDA Performed at [facility]    Report Status 08/25/2015 FINAL  Final  Fungus Culture with Smear     Status: None (Preliminary result)   Collection Time: 08/22/15 12:48 PM  Result Value Ref Range Status   Specimen Description BRONCHIAL ALVEOLAR LAVAGE  Final   Special Requests Immunocompromised  Final   Fungal Smear   Final    NO YEAST OR FUNGAL ELEMENTS SEEN Performed at [facility]    Culture   Final    CULTURE IN PROGRESS FOR FOUR WEEKS Performed at [facility]    Report Status PENDING  Incomplete  Pneumocystis smear by DFA     Status: None   Collection Time: 08/22/15 12:49 PM  Result Value Ref Range Status   Specimen Source-PJSRC BRONCHIAL ALVEOLAR LAVAGE  Final   Pneumocystis jiroveci Ag NEGATIVE  Final    Comment: Performed at [facility]  Respiratory virus panel     Status: None   Collection Time: 08/23/15 11:47 PM  Result Value Ref Range Status   Respiratory Syncytial Virus A Negative Negative Final   Respiratory Syncytial Virus B Negative Negative Final   Influenza A Negative Negative Final   Influenza B Negative Negative Final   Parainfluenza 1 Negative Negative Final   Parainfluenza 2 Negative Negative Final   Parainfluenza 3 Negative Negative Final   Metapneumovirus Negative Negative Final   Rhinovirus Negative Negative Final   Adenovirus Negative Negative Final    Comment: (NOTE) Performed At: [facility] [address] [place], [place] [id number] Lindon Romp MD GE:[id number]   Culture, respiratory (NON-Expectorated)     Status: None   Collection Time: 08/28/15 10:54 AM  Result Value Ref Range Status   Specimen Description TRACHEAL ASPIRATE  Final   Special Requests NONE  Final   Gram Stain   Final    FEW WBC PRESENT,BOTH PMN AND  MONONUCLEAR RARE SQUAMOUS EPITHELIAL CELLS PRESENT YEAST Performed at [facility]    Culture   Final    FEW CANDIDA ALBICANS Performed at [facility]    Report Status 08/30/2015 FINAL  Final   PORTABLE CHEST - 1 VIEW 08/30/2015  COMPARISON: August 29, 2015  FINDINGS: Endotracheal tube and nasogastric tube have been removed. Central catheter tip is in the superior vena cava near the cavoatrial junction, stable. There is no demonstrable pneumothorax. There is, however, subcutaneous air, primarily on the right, stable. There is patchy airspace disease in both lower lobes, slightly more on the left than on the right, stable. No new opacity. Heart size and pulmonary vascularity are normal. No adenopathy. No bone lesions.  IMPRESSION: There is subcutaneous air, primarily on the right, but no pneumothorax appreciable. Patchy airspace disease in the bases is stable. No new opacity. No change in cardiac silhouette.  Electronically Signed  By: Lowella Grip III M.D.  On: 08/30/2015 07:03   ASSESSMENT: He is still febrile but his temp curve appears to be trending down and recent chest x-rays have improved. BAL cultures grew only Candida albicans which is an insignificant colonizer. I will continue vancomycin and ceftazadime a little longer for possible residual HCAP.  PLAN: 1. Continue vancomycin and ceftazidime  Michel Bickers, MD [facility] for Infectious [place] [facility] [phone] pager   [phone] cell 08/30/2015, 4:34 PM

## 2015-08-30 NOTE — Evaluation (Signed)
Clinical/Bedside Swallow Evaluation Patient Details  Name: Ethan Stewart MRN: [id number] Date of Birth: 1946/08/17  Today's Date: 08/30/2015 Time: SLP Start Time (ACUTE ONLY): 1009 SLP Stop Time (ACUTE ONLY): 1053 SLP Time Calculation (min) (ACUTE ONLY): 44 min  Past Medical History:  Past Medical History  Diagnosis Date  . History of bleeding ulcers 1990's  . 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 Anxiety   . GERD (gastroesophageal reflux disease)   . H. pylori infection   . History of blood transfusion   . Bone cancer      tumor on C5  . Arm numbness 08/07/15    Limited movement due to tumor on spine  . Diffuse large B cell lymphoma     biposy 07/25/2105   Past Surgical History:  Past Surgical History  Procedure Laterality Date  . Hernia repair Bilateral     with mesh  . Colonoscopy    . Eye surgery Right     catheter  . Anterior cervical corpectomy N/A 07/26/2015    Procedure: Cervical five Corpectomy/Cervical four-six Fusion/Plate ;  Surgeon: Eustace Moore, MD;  Location: [facility];  Service: Neurosurgery;  Laterality: N/A;  C5 Corpectomy/C4-6 Fusion/Plate C4-6   HPI:  69 y.o. male with a history of Diffuse Large B-Cell Lymphoma diagnosed 07/2015 on R-CHOP Chemo Rx, GERD, bone cancer, cervical spine tumor, bleeding ulcers admitted with complaints of fevers and chills and cough with SOB x 2 days. Pt underwent s/p C5 corpectomy with C4-C6 arthrodesis and anterior cervical plating of C4-C6 8/13. Intubated 8/30-9/6. CXR 9/5 progressive opacification and volume loss in the right lower lobe concerning for increasing atelectasis versus developing infiltrate and repeat CXR 9/7 there is subcutaneous air, primarily on the right, but no pneumothorax appreciable. Patchy airspace disease in the bases is stable. No new opacity. No change in cardiac silhouette. RN reported coughing after ice chips and BSE obtained.   Assessment / Plan / Recommendation Clinical Impression  Pt with significant,  generalized weakness, flat affect with wife at bedside. Decreased vocal intensity with quality minimally hoarse. Immedidate s/s aspiration (cough) following ice chip presentation indicative of poor airway protection, laryngeal elevation decreased during manual palpation. Pharyngeal residue likely with multiple swallows observed. Swallow inititation appeared timely. SLP recommending Dys 1 (puree), nectar thick liquids via TEASPOON only, SIT UPRIGHT, double swallow, alternate liq/solid, volitional throat clears. Discussed with pt/wife and RN pt's increased risk given intubation, recent cervical surgery and general fatigue. Swallow precautions must be closely followed. ST will continue to intervene.      Aspiration Risk   (mod-severe)    Diet Recommendation Dysphagia 1 (Puree);Nectar   Medication Administration: Crushed with puree Compensations: Slow rate;Small sips/bites    Other  Recommendations Oral Care Recommendations: Oral care QID   Follow Up Recommendations       Frequency and Duration min 2x/week  2 weeks   Pertinent Vitals/Pain Pt on scheduled pain meds    SLP Swallow Goals     Swallow Study Prior Functional Status       General Other Pertinent Information: 69 y.o. male with a history of Diffuse Large B-Cell Lymphoma diagnosed 07/2015 on R-CHOP Chemo Rx, GERD, bone cancer, cervical spine tumor, bleeding ulcers admitted with complaints of fevers and chills and cough with SOB x 2 days. Pt underwent s/p C5 corpectomy with C4-C6 arthrodesis and anterior cervical plating of C4-C6 8/13. Intubated 8/30-9/6. CXR 9/5 progressive opacification and volume loss in the right lower lobe concerning for increasing atelectasis versus  developing infiltrate and repeat CXR 9/7 there is subcutaneous air, primarily on the right, but no pneumothorax appreciable. Patchy airspace disease in the bases is stable. No new opacity. No change in cardiac silhouette. RN reported coughing after ice chips and BSE  obtained. Type of Study: Bedside swallow evaluation Previous Swallow Assessment: none Diet Prior to this Study: NPO Temperature Spikes Noted: Yes (Intermittently sinc 9/2, 101 this am) Respiratory Status: Supplemental O2 delivered via (comment) History of Recent Intubation: Yes Length of Intubations (days): 8 days Date extubated: 08/29/15 Behavior/Cognition: Alert;Cooperative;Requires cueing (very flat affect) Oral Cavity - Dentition: Adequate natural dentition/normal for age Self-Feeding Abilities: Total assist Patient Positioning: Upright in bed Baseline Vocal Quality: Low vocal intensity Volitional Cough: Weak Volitional Swallow: Able to elicit    Oral/Motor/Sensory Function Overall Oral Motor/Sensory Function:  (general weakness, no focal deficits)   Ice Chips Ice chips: Impaired Presentation: Spoon Oral Phase Impairments: Reduced lingual movement/coordination Pharyngeal Phase Impairments: Cough - Immediate;Decreased hyoid-laryngeal movement (multiple swallows)   Thin Liquid Thin Liquid: Not tested    Nectar Thick Nectar Thick Liquid: Impaired Presentation: Cup;Spoon Oral Phase Impairments:  (min labial residue) Pharyngeal Phase Impairments: Decreased hyoid-laryngeal movement;Multiple swallows   Honey Thick Honey Thick Liquid: Not tested   Puree Puree: Impaired Presentation: Spoon Pharyngeal Phase Impairments: Decreased hyoid-laryngeal movement;Multiple swallows   Solid   GO    Solid: Not tested       Ethan Stewart 08/30/2015,11:19 AM   Ethan Stewart [employer] [phone]

## 2015-08-30 NOTE — Progress Notes (Signed)
[name]   HEMATOLOGY/ONCOLOGY INPATIENT PROGRESS NOTE   Date of Service: 08/30/2015  Inpatient Attending: .Rigoberto Noel, MD   SUBJECTIVE  [name] was seen for unknown today with his brother-in-law at bedside. He is much more awake and alert and oriented now compared to yesterday. Down to 2 L of oxygen by nasal cannula and saturating 100%. Some low-grade fevers but no significant spikes. Continues to be on ceftazidime plus vancomycin to cover him for possible HCAP though his bronchi showed no positive cultures. Notes that he is quite bothered by his urinary catheter. His opening to get out of bed as soon as possible and disconnect "the wires". I encouraged him to try to work well with physical and occupational therapy so he can start getting back some of his strength.   OBJECTIVE:  PHYSICAL EXAMINATION: . Filed Vitals:   08/30/15 1600 08/30/15 1622 08/30/15 1630 08/30/15 1637  BP: 122/63 121/66  122/68  Pulse:      Temp: 100.2 F (37.9 C) 100.2 F (37.9 C) 99.1 F (37.3 C) 99.3 F (37.4 C)  TempSrc:      Resp: 39 39 44 37  Height:      Weight:      SpO2: 100% 100% 100% 100%   GENERAL: awake, in no overt respiratory distress. SKIN: no acute rashes EYES: open, tracking movement OROPHARYNX:  NECK: no JVD, C- collar on LYMPH:  no palpable lymphadenopathy in the cervical, axillary LUNGS:CTA b/l HEART: regular rate & rhythm,  no murmurs and bilateral lower extremity 1+ edema. ABDOMEN: abdomen soft, non-tender, normoactive bowel sounds  NEURO: alert , oriented x 2-3, baseline RUE weakness overall appears quite fatigued.   ALLERGIES:  has No Known Allergies.  MEDICATIONS:  Scheduled Meds: . sodium chloride   Intravenous Once  . antiseptic oral rinse  7 mL Mouth Rinse QID  . cefTAZidime (FORTAZ)  IV  2 g Intravenous 3 times per day  . chlorhexidine gluconate  15 mL Mouth Rinse BID  . dexamethasone  2 mg Intravenous Q12H  . enoxaparin (LOVENOX) injection  40 mg Subcutaneous  Q24H  . insulin aspart  0-9 Units Subcutaneous 6 times per day  . pantoprazole sodium  40 mg Per Tube Daily  . sodium chloride  1,000 mL Intravenous Once  . sodium chloride  500 mL Intravenous Once  . sodium chloride  10-40 mL Intracatheter Q12H  . vancomycin  1,000 mg Intravenous Q8H   Continuous Infusions:   PRN Meds:.acetaminophen **OR** acetaminophen, albuterol, diazepam, heparin lock flush, heparin lock flush, neomycin-bacitracin-polymyxin, [DISCONTINUED] ondansetron **OR** ondansetron (ZOFRAN) IV, RESOURCE THICKENUP CLEAR, sodium chloride, sodium chloride, sodium chloride  REVIEW OF SYSTEMS:    10 Point review of Systems was done is negative except as noted above.   LABORATORY DATA: . CBC  . CBC Latest Ref Rng 08/30/2015 08/29/2015 08/27/2015  WBC 4.0 - 10.5 K/uL 18.1(H) 18.1(H) 18.5(H)  Hemoglobin 13.0 - 17.0 g/dL 7.9(L) 8.7(L) 8.7(L)  Hematocrit 39.0 - 52.0 % 25.1(L) 27.2(L) 27.0(L)  Platelets 150 - 400 K/uL 359 410(H) 399    . CMP Latest Ref Rng 08/30/2015 08/29/2015 08/28/2015  Glucose 65 - 99 mg/dL 157(H) 142(H) 145(H)  BUN 6 - 20 mg/dL 45(H) 49(H) 46(H)  Creatinine 0.61 - 1.24 mg/dL 0.68 0.57(L) 0.67  Sodium 135 - 145 mmol/L 146(H) 148(H) 147(H)  Potassium 3.5 - 5.1 mmol/L 3.7 4.4 4.2  Chloride 101 - 111 mmol/L 107 106 103  CO2 22 - 32 mmol/L 32 37(H) 37(H)  Calcium 8.9 -  10.3 mg/dL 8.4(L) 8.4(L) 8.6(L)  Total Protein 6.5 - 8.1 g/dL - - -  Total Bilirubin 0.3 - 1.2 mg/dL - - -  Alkaline Phos 38 - 126 U/L - - -  AST 15 - 41 U/L - - -  ALT 17 - 63 U/L - - -       Sedimentation rate  50<----61<---100 down from 125 CRP 2.6<--7.8<--- 19.3 down from 45.5  CMV IgG positive CMV IgM negative CMV DNA by PCR positive (qualitative)   Bronchoalveolar lavage RareWBC present both PMN and mononuclear. No squamous epithelial cells. No organisms seen.  Pneumocystis smear negative  Fungal cultures note yeast or fungal elements noted.         RADIOGRAPHIC  STUDIES:ASSESSMENT & PLAN:   #1  Diffuse large B-cell lymphoma stage IV AE with involvement of mesenteric, inguinal lymph nodes and C5 vertebral body with spinal cord impingement.  He had a C5 corpectomy on 07/26/2015 that showed diffuse large B-cell lymphoma with a Ki-67 ranging from 20 to 90%.    DLBCL (diffuse large B cell lymphoma)   06/21/2015 Imaging CT Chest/abd/pelvis: IMPRESSION: 5 mm nodule within the right lower lobe as described.  Pneumobilia consistent with a prior sphincterotomy.  No acute abnormality is identified. No findings to suggest chest etiology for the known lesion at C5 are seen.   06/21/2015 Imaging MRI c spine1. Diffuse marrow replacement of the C5 vertebral body highly concerning for neoplasm (metastatic disease, myeloma, or lymphoma). Epidural tumor at this level results in moderate spinal stenosis with moderate impression on the spinal cord    07/10/2015 PET scan 1. The previously demonstrated abnormal C5 vertebral body is hypermetabolic. No other osseous lesions demonstrated. 2. There are small hypermetabolic lymph nodes within the left mesentery and both inguinal regions.    07/26/2015 Initial Biopsy Had a C5 corpectomy pathology showed diffuse large B-cell lymphoma   08/08/2015 -  Chemotherapy Started for cycle of R CHOP.    08/09/2015 -  Chemotherapy Received first cycle of intrathecal methotrexate plus hydrocortisone for CNS prophylaxis.   He is currently s/p 1 st cycle of R-CHOP on 08/08/2015 and was scheduled for 2nd cycle of R-CHOP on 08/31/2015.   We might need to dose adjust his chemotherapy once he is better to reduce the risk of neutropenic complications (though he really did not have prolonged neutropenia) since be might be hard pressed to not use any G-CSF. Plan PT/OT evaluation Evaluation for Acute inpatient rehabilitation vs TCU We will need to determine the timing and intensity of his chemotherapy based on his clinical course from this point on. Considering  that he is being treated with curative intent would try to avoid >1week delay in next cycle of chemotherapy if possible. (considering R-mini CHOP and will likely hold IT methotrexate prophylaxis this cycle)  #2 ARDS with fevers and bilateral groundglass pulmonary opacities. Noncardiogenic pulmonary edema normal BNP. Mild troponin leak due to demand ischemia which normalized.   Neulasta related ARDS versus HCAP.  Lungs much improved patient has been extubated and is currently only using 2 L of oxygen. Still some low-grade fevers. ID following.  Blood cultures negative thus far Viral respiratory panel negative Bronchoalveolar lavage with no overt organisms seen on Gram stain. PCP negative. CMV IgG positive IgM negative PCR qualitative positive.  Elevated sedimentation rate and CRP now showing improvement which is a good sign.  Urine testing negative for Legionella antigen and Streptococcus pneumoniae. Repeat BAL showed Candida which is unlikely incidental organism.  #3  hypoxic respiratory failure due to #2 much improved now also went and with decreasing oxygen requirements.  #4 Anemia related to chemotherapy and lymphoma as well as possible sepsis. #5  leukocytosis likely due to Neulasta and steroids and less likely due to ongoing sepsis . #6 ICU delirium - meds/intubation/steroids/infection -improved today #7 protein calorie malnutrition #8 significant muscle wasting due to immobilization, steroids, nutritional limitations,, neoplasm. #9 symptomatically anemia due to chemotherapy plus multiple blood draws plus poor bone marrow reserve. Plan -on ceftazidime plus vancomycin in the setting of low-grade fevers per ID - reduced dexamethasone today to 2 mg twice daily. -We will have to avoid using any G-CSF in the future. -extubated today, being evaluated by therapies -aggressive dietary support -only irradiated blood products for transfusion. -ordered for transfusion of 2 units of PRBC to  maintain hgb closer to 9 especially in the setting of delirium and limited BM and physiologic reserve and need for additional chemotherapy. -will continue to follow up daily. -Will need aggressive inpatient acute rehabilitation vsTCU depending on tolerance due to loss of muscle mass from immobilization, steroids and lymphoma and questionable nutritional status. -Consider switching foleys cath to condom catheter if possible or if patient more awake alert and mobile-consider removal of urinary catheter since this appears to be bothering him significantly and can be a source of additional delirium.   Appreciate excellent critical care/ pulmonary care and ID input.  provided update to his brother-in-law at bedside.   The total time spent  was 25 minutes and more than 50% was on counseling and direct patient cares.    Sullivan Lone MD [place] AAHIVMS [facility] Hematology/Oncology Physician [place]  (Office):       [phone] (Work cell):  [phone] (Fax):           [phone]

## 2015-08-30 NOTE — Progress Notes (Signed)
Reordered Packed Red Blood Cells 2 units. The original order from [facility] did not cross over to Blood Bank. Will transfuse when units are available.

## 2015-08-30 NOTE — Progress Notes (Signed)
ANTIBIOTIC CONSULT NOTE - FOLLOW UP  Pharmacy Consult for Vancomycin, Ceftazidime Indication: pneumonia  No Known Allergies  Patient Measurements: Height: 6\' 1"  (185.4 cm) Weight: 196 lb 10.4 oz (89.2 kg) IBW/kg (Calculated) : 79.9  Vital Signs: Temp: 100.6 F (38.1 C) (09/07 0900) Temp Source: Core (Comment) (09/07 0700) BP: 151/52 mmHg (09/07 0900) Intake/Output from previous day: 09/06 0701 - 09/07 0700 In: 1598.1 [I.V.:538.1; NG/GT:310; IV Piggyback:750] Out: 2130 [Urine:4030; Stool:1]  Labs:  Recent Labs  08/28/15 0416 08/29/15 0310 08/30/15 0552  WBC  --  18.1* 18.1*  HGB  --  8.7* 7.9*  PLT  --  410* 359  CREATININE 0.67 0.57* 0.68   Estimated Creatinine Clearance: 98.5 mL/min (by C-G formula based on Cr of 0.68). No results for input(s): VANCOTROUGH, VANCOPEAK, VANCORANDOM, GENTTROUGH, GENTPEAK, GENTRANDOM, TOBRATROUGH, TOBRAPEAK, TOBRARND, AMIKACINPEAK, AMIKACINTROU, AMIKACIN in the last 72 hours.   Assessment: 69 yr male admitted 8/28 with fever and SOB. PMH includes NonHodgkin's Lymphoma, undergoing chemotherapy. CXR shows ARDS with likely superimposed PNA, likely atypical pna in immunocompromised pt.  ID consulted, possible PCP PNA, or  G-CSF related pneumonitis (received Neulasta 8/18).  He completed 8 days of broad spectrum antibiotics on 9/5.  Later that day, he acutely desaturated, was febrile, and had stat bronc performed.  Pharmacy was consulted to dose Vancomycin and ceftazidime.  8/28 >>zosyn >> 9/5 8/29 >>vanc >> 8/29, 9/5 >> Vanc >> 8/29 >> azithromycin >> 8/31 8/30 >> bactrim >> 8/31 9/5 >> Ceftaz >>  Today, 08/30/2015:  Temp: 101.9  WBC: elevated, 18.1 (Neulasta 8/18, decadron)  Renal: SCr 0.68 with CrCl ~ 98 ml/min CG  BAL trach aspirate culture (9/5): few Candida Albicans  Vancomycin trough level pending collection.  Goal of Therapy:  Vancomycin trough level 15-20 mcg/ml Appropriate abx dosing, eradication of infection.   Plan:    Continue Ceftazidime 2g IV q8h  Continue Vancomycin 1g IV q8h.  Measure Vanc trough at steady state.  Follow up renal fxn, culture results, and clinical course.   Gretta Arab PharmD, BCPS Pager [phone] 08/30/2015 1:49 PM

## 2015-08-30 NOTE — Progress Notes (Signed)
Pharmacy - Antibiotic dosing  Assessment: See earlier note from [facility] for full details.  2 yoM on vancomycin for PNA.  VT before 8th dose therapeutic at 17 mcg/mL  Plan:  Continue vancomycin 1g IV q8 hr  Continue ceftazidime as ordered  Reuel Boom, PharmD, BCPS Pager: [phone] 08/30/2015, 9:59 PM

## 2015-08-30 NOTE — Progress Notes (Signed)
[name]   HEMATOLOGY/ONCOLOGY INPATIENT PROGRESS NOTE   Date of Service: 08/29/2015 (late entry)  Inpatient Attending: .Rigoberto Noel, MD   SUBJECTIVE  Ethan Stewart was seen at 5 pm yesterday evening. (late entry note due to computer issues). He was extubated and breathing comfortably on 3L/min [place] at saturating 96%. Notes to be a little confused and repeating that he wanted to be "off the machine" indicating he wanted to get out of bed. Was able to recognize me. Met and updated wife at bedside. Being evaluated by therapies.   OBJECTIVE:   PHYSICAL EXAMINATION: VS as per EPIC GENERAL: awake, extubated today, no overt respiratory distress. SKIN: no acute rashes EYES: open, tracking movement OROPHARYNX:  NECK: no JVD, C- collar on LYMPH:  no palpable lymphadenopathy in the cervical, axillary LUNGS:CTA b/l HEART: regular rate & rhythm,  no murmurs and bilateral lower extremity 1+ edema. ABDOMEN: abdomen soft, non-tender, normoactive bowel sounds  NEURO: alert , oriented x 2, baseline RUE weakness   ALLERGIES:  has No Known Allergies.  MEDICATIONS:  Scheduled Meds: . sodium chloride  250 mL Intravenous Once  . acetylcysteine  4 mL Nebulization BID  . antiseptic oral rinse  7 mL Mouth Rinse QID  . cefTAZidime (FORTAZ)  IV  2 g Intravenous 3 times per day  . chlorhexidine gluconate  15 mL Mouth Rinse BID  . dexamethasone  4 mg Intravenous Q12H  . enoxaparin (LOVENOX) injection  40 mg Subcutaneous Q24H  . fentaNYL (SUBLIMAZE) injection  50 mcg Intravenous Once  . free water  300 mL Per Tube 3 times per day  . insulin aspart  0-9 Units Subcutaneous 6 times per day  . pantoprazole sodium  40 mg Per Tube Daily  . sodium chloride  1,000 mL Intravenous Once  . sodium chloride  500 mL Intravenous Once  . sodium chloride  10-40 mL Intracatheter Q12H  . vancomycin  1,000 mg Intravenous Q8H   Continuous Infusions:   PRN Meds:.acetaminophen **OR** acetaminophen, albuterol, diazepam,  midazolam, neomycin-bacitracin-polymyxin, [DISCONTINUED] ondansetron **OR** ondansetron (ZOFRAN) IV, sodium chloride  REVIEW OF SYSTEMS:    10 Point review of Systems was done is negative except as noted above.   LABORATORY DATA:  Component     Latest Ref Rng 08/29/2015  WBC     4.0 - 10.5 K/uL 18.1 (H)  RBC     4.22 - 5.81 MIL/uL 2.91 (L)  Hemoglobin     13.0 - 17.0 g/dL 8.7 (L)  HCT     39.0 - 52.0 % 27.2 (L)  MCV     78.0 - 100.0 fL 93.5  MCH     26.0 - 34.0 pg 29.9  MCHC     30.0 - 36.0 g/dL 32.0  RDW     11.5 - 15.5 % 18.0 (H)  Platelets     150 - 400 K/uL 410 (H)  Neutrophils     43 - 77 % 89 (H)  Lymphocytes     12 - 46 % 3 (L)  Monocytes Relative     3 - 12 % 8  Eosinophil     0 - 5 % 0  Basophil     0 - 1 % 0  NEUT#     1.7 - 7.7 K/uL 16.2 (H)  Lymphocyte #     0.7 - 4.0 K/uL 0.5 (L)  Monocyte #     0.1 - 1.0 K/uL 1.4 (H)  Eosinophils Absolute     0.0 - 0.7  K/uL 0.0  Basophils Absolute     0.0 - 0.1 K/uL 0.0  RBC Morphology      POLYCHROMASIA PRESENT  WBC Morphology      MILD LEFT SHIFT (1-5% METAS, OCC MYELO, OCC BANDS)  Sodium     135 - 145 mmol/L 148 (H)  Potassium     3.5 - 5.1 mmol/L 4.4  Chloride     101 - 111 mmol/L 106  CO2     22 - 32 mmol/L 37 (H)  Glucose     65 - 99 mg/dL 142 (H)  BUN     6 - 20 mg/dL 49 (H)  Creatinine     0.61 - 1.24 mg/dL 0.57 (L)  Calcium     8.9 - 10.3 mg/dL 8.4 (L)  EGFR (Non-African Amer.)     >60 mL/min >60  EGFR (African American)     >60 mL/min >60  Anion gap     5 - 15 5  Phosphorus     2.5 - 4.6 mg/dL 3.5  Magnesium     1.7 - 2.4 mg/dL 2.5 (H)    Sedimentation rate  50<----61<---100 down from 125 CRP 2.6<--7.8<--- 19.3 down from 45.5  CMV IgG positive CMV IgM negative CMV DNA by PCR positive (qualitative)   Bronchoalveolar lavage RareWBC present both PMN and mononuclear. No squamous epithelial cells. No organisms seen.  Pneumocystis smear negative  Fungal cultures note yeast or  fungal elements noted.         RADIOGRAPHIC STUDIES:ASSESSMENT & PLAN:   #1  Diffuse large B-cell lymphoma stage IV AE with involvement of mesenteric, inguinal lymph nodes and C5 vertebral body with spinal cord impingement.  He had a C5 corpectomy on 07/26/2015 that showed diffuse large B-cell lymphoma with a Ki-67 ranging from 20 to 90%.    DLBCL (diffuse large B cell lymphoma)   06/21/2015 Imaging CT Chest/abd/pelvis: IMPRESSION: 5 mm nodule within the right lower lobe as described.  Pneumobilia consistent with a prior sphincterotomy.  No acute abnormality is identified. No findings to suggest chest etiology for the known lesion at C5 are seen.   06/21/2015 Imaging MRI c spine1. Diffuse marrow replacement of the C5 vertebral body highly concerning for neoplasm (metastatic disease, myeloma, or lymphoma). Epidural tumor at this level results in moderate spinal stenosis with moderate impression on the spinal cord    07/10/2015 PET scan 1. The previously demonstrated abnormal C5 vertebral body is hypermetabolic. No other osseous lesions demonstrated. 2. There are small hypermetabolic lymph nodes within the left mesentery and both inguinal regions.    07/26/2015 Initial Biopsy Had a C5 corpectomy pathology showed diffuse large B-cell lymphoma   08/08/2015 -  Chemotherapy Started for cycle of R CHOP.    08/09/2015 -  Chemotherapy Received first cycle of intrathecal methotrexate plus hydrocortisone for CNS prophylaxis.   He is currently s/p 1 st cycle of R-CHOP on 08/08/2015 and was scheduled for 2nd cycle of R-CHOP on 08/31/2015.   We might need to dose adjust his chemotherapy once he is better to reduce the risk of neutropenic complications since be might be hard pressed to not use any G-CSF.  Plan PT/OT evaluation Evaluation for Acute inpatient rehabilitation once extubated. We will need to determine the timing and intensity of his chemotherapy based on his clinical course from this point on.  Considering that he is being treated with curative intent would try to avoid >1week delay in next cycle of chemotherapy if possible. (considering R-mini CHOP  and will likely hold IT methotrexate prophylaxis this cycle)  #2 ARDS with fevers and bilateral groundglass pulmonary opacities. Noncardiogenic pulmonary edema normal BNP. Mild troponin leak due to demand ischemia which normalized.   Neulasta related ARDS versus HCAP  Overall picture appears increasingly more likely related to Neulasta. Neulasta itself would increase the risk of ARDS like picture/pneumonitis in the setting of a lung infection/lung injury from chemotherapy and this typically presents with recovering WBC counts as with Ethan Stewart. His Neulasta should soon be out of his system typically lasts for 12-14 days after administration on 8/19.  Blood cultures negative thus far Viral respiratory panel negative Bronchoalveolar lavage with no overt organisms seen on Gram stain. PCP negative. CMV IgG positive IgM negative PCR qualitative positive.  Elevated sedimentation rate and CRP now showing improvement which is a good sign.  Urine testing negative for Legionella antigen and Streptococcus pneumoniae.  #3 hypoxic respiratory failure due to #2 patient developed right-sided atelectasis due to mucous plugging requiring stat bronchoscopy and suctioning today with subsequent stabilization and repeat x-ray showing improvement.  #4 Anemia related to chemotherapy and lymphoma as well as possible sepsis. #5  leukocytosis likely due to Neulasta and steroids and less likely due to ongoing sepsis . #6 ICU delirium - meds/intubation/steroids/infection Plan -on ceftazidime plus vancomycin in the setting of low-grade fevers (ruling out VAP) -would reduce dexamethasone to 51m daily and taper as tolerated. -updated wife at bedside. -We will have to avoid using any G-CSF in the future. -extubated today, being evaluated by therapies -aggressive  dietary support -only irradiated blood products for transfusion. -would transfuse PRBC as needed to maintain hgb closer to 9 especially in the setting of delirium and limited BM and physiologic reserve. -will continue to follow up daily. -Will need aggressive inpatient acute rehabilitation due to loss of muscle mass from immobilization, steroids and lymphoma and questionable nutritional status.  Appreciate excellent critical care/ pulmonary care, hospitalist and ID input.   The total time spent  was 25 minutes and more than 50% was on counseling and direct patient cares.    GSullivan LoneMD MRicoAAHIVMS [facility] LLCCBanner Estrella Surgery CenterWHabersham County Medical CtrHematology/Oncology Physician CCondon (Office):       [phone](Work cell):  [phone](Fax):           [phone]

## 2015-08-30 NOTE — Progress Notes (Signed)
PULMONARY / CRITICAL CARE MEDICINE   Name: Ethan Stewart MRN: [id number] DOB: 08/30/1946    ADMISSION DATE:  08/20/2015 CONSULTATION DATE:  08/30/2015  REFERRING MD : Wynelle Cleveland  CHIEF COMPLAINT: SOB  INITIAL PRESENTATION: 69 y.o. M with diffuse large B cell lymphoma, brought to [facility] ED 8/29 with HCAP & ARDS.  R-CHOP Chemo Rx last given on 08/09/2015  status post C5 corpectomy on 8/3.  STUDIES:  CXR 8/29 >>> worsened central lung alveolar opacities bilaterally, suspicious for infectious infiltrates. CTA chest 8/29 >>> b/l airspace disease c/w atypical PNA or non-cardiogenic edema. No PE. CXR 9/2 >> sub cut emphysema RT neck  SIGNIFICANT EVENTS: 8/29 - admitted 8/30 intubated, ARDS protocol 9/3- off pressors, diuresis neg 2.2 liters  SUBJECTIVE:  Febrile Good UO No obvious pain Wants to go home  VITAL SIGNS: Temp:  [97.3 F (36.3 C)-101.7 F (38.7 C)] 100.8 F (38.2 C) (09/07 0740) Resp:  [11-44] 17 (09/07 0740) BP: (109-172)/(44-107) 112/55 mmHg (09/07 0700) SpO2:  [94 %-100 %] 99 % (09/07 0839) FiO2 (%):  [40 %] 40 % (09/06 0909) Weight:  [196 lb 10.4 oz (89.2 kg)] 196 lb 10.4 oz (89.2 kg) (09/07 0641) HEMODYNAMICS:   VENTILATOR SETTINGS: Vent Mode:  [-] CPAP;PSV FiO2 (%):  [40 %] 40 % PEEP:  [5 cmH20-8 cmH20] 5 cmH20 Pressure Support:  [5 cmH20] 5 cmH20 INTAKE / OUTPUT: Intake/Output      09/06 0701 - 09/07 0700 09/07 0701 - 09/08 0700   P.O. 0    I.V. (mL/kg) 538.1 (6)    NG/GT 310    IV Piggyback 750    Total Intake(mL/kg) 1598.1 (17.9)    Urine (mL/kg/hr) 4030 (1.9)    Stool 1 (0)    Total Output 4031     Net -2432.9          Stool Occurrence 1 x      PHYSICAL EXAMINATION: General: Male of normal body habitus intubated, C- collar Neuro: awake, follows commands HEENT: PERRL  Cardiovascular: RRR, no MRG Lungs: coarse diffuse, mild anterior Abdomen: Soft, non-tender, non-distended, no r/g Musculoskeletal: No acute deformity Skin: Grossly  intact  LABS:  CBC  Recent Labs Lab 08/27/15 0451 08/29/15 0310 08/30/15 0552  WBC 18.5* 18.1* 18.1*  HGB 8.7* 8.7* 7.9*  HCT 27.0* 27.2* 25.1*  PLT 399 410* 359   Coag's No results for input(s): APTT, INR in the last 168 hours. BMET  Recent Labs Lab 08/28/15 0416 08/29/15 0310 08/30/15 0552  NA 147* 148* 146*  K 4.2 4.4 3.7  CL 103 106 107  CO2 37* 37* 32  BUN 46* 49* 45*  CREATININE 0.67 0.57* 0.68  GLUCOSE 145* 142* 157*   Electrolytes  Recent Labs Lab 08/25/15 0430  08/28/15 0416 08/29/15 0310 08/30/15 0552  CALCIUM 8.3*  < > 8.6* 8.4* 8.4*  MG 2.7*  --  2.6* 2.5*  --   PHOS 3.3  --  3.8 3.5  --   < > = values in this interval not displayed. Sepsis Markers No results for input(s): LATICACIDVEN, PROCALCITON, O2SATVEN in the last 168 hours. ABG  Recent Labs Lab 08/24/15 0315 08/28/15 1606 08/29/15 1000  PHART 7.319* 7.469* 7.482*  PCO2ART 47.8* 48.9* 46.5*  PO2ART 66.6* 86.6 62.9*   Liver Enzymes  Recent Labs Lab 08/27/15 0451  AST 53*  ALT 30  ALKPHOS 98  BILITOT 0.7  ALBUMIN 2.1*   Cardiac Enzymes No results for input(s): TROPONINI, PROBNP in the last 168 hours. Glucose  Recent Labs Lab 08/29/15 1201 08/29/15 1700 08/29/15 2011 08/29/15 2340 08/30/15 0412 08/30/15 0833  GLUCAP 125* 120* 112* 115* 113* 115*    Imaging Dg Chest Port 1 View  08/30/2015   CLINICAL DATA:  Respiratory failure/hypoxia  EXAM: PORTABLE CHEST - 1 VIEW  COMPARISON:  August 29, 2015  FINDINGS: Endotracheal tube and nasogastric tube have been removed. Central catheter tip is in the superior vena cava near the cavoatrial junction, stable. There is no demonstrable pneumothorax. There is, however, subcutaneous air, primarily on the right, stable. There is patchy airspace disease in both lower lobes, slightly more on the left than on the right, stable. No new opacity. Heart size and pulmonary vascularity are normal. No adenopathy. No bone lesions.  IMPRESSION:  There is subcutaneous air, primarily on the right, but no pneumothorax appreciable. Patchy airspace disease in the bases is stable. No new opacity. No change in cardiac silhouette.   Electronically Signed   By: Lowella Grip III M.D.   On: 08/30/2015 07:03     ASSESSMENT / PLAN:  PULMONARY A: ETT 8/30 >>9/6 ARDS -improved HCAP- cx neg. DD incl G-CSF related pneumonitis Barotrauma - sub cut emphysema 9/2 Pneumomediastinum from ards P:  Reduce steroids to q24h  CARDIOVASCULAR A:  Mild troponin leak - suspect demand ischemia. Hypotension - due to PEEP/sedatives HTN 9/5 off sedation P:  Dc CVP Lasix  daily   RENAL A: Hypernatremia P:  kvo   GASTROINTESTINAL A:  GERD. Protein calorie malnutrition. Some loose stools increased, r/o cdiff sent but since sent then no stool noted - NOT a clinical circumstance of cdiff now P:  Pantoprazole Swallow eval 7 advance  HEMATOLOGIC / ONCOLOGIC A:  Diffuse large B cell lymphoma (follows with Dr. Vivien Rossetti) s/p 1 cycle of R-CHOP Anemia. VTE Prophylaxis. P:  SCD's / Lovenox.  INFECTIOUS A:  HCAP / ? Atypical PNA. NO stools now since 2 days P:  BCx2 8/29 > ng UCx 8/29 >ng Sputum Cx 8/29 > PCP stain 8/29 >neg U. Legionella 8/29 >neg U. Strep 8/29 >neg RVP 8/29 > nasal swab >> neg RVP 8/30 BAL >>neg Abx: Zosyn, start date 8/29 >> Abx: Azithro, start date 8/29 >>9/2 Abx: Bactrim, start date 8/29 >> 8/31 Vanc 8/29 >>9/1   zosyn per ID -can stop soon  send cdiff if stool re occur  ENDOCRINE A:  At risk for steroid induced hyperglycemia.  P:  SSI -sensitive  NEUROLOGIC A:  Anxiety on valium Hx C5 tumor - s/p C5 corpectomy with C4-C6 arthrodesis and anterior cervical plating of C4-C6 (Dr. Ronnald Ramp - 07/26/15). P:  PT following - SNF plan   Code Status: Full.  Family updated: wife updated daily  Interdisciplinary Family Meeting v Palliative Care Meeting: done  Summary - ARDS  resolving with barotrauma,  extubated , advance with swallow & aggressive PT  The patient is critically ill with multiple organ systems failure and requires high complexity decision making for assessment and support, frequent evaluation and titration of therapies, application of advanced monitoring technologies and extensive interpretation of multiple databases. Critical Care Time devoted to patient care services described in this note independent of APP time is 31 minutes.    Kara Mead MD. Shade Flood. [facility] Pager [phone] If no response call [phone]

## 2015-08-31 ENCOUNTER — Ambulatory Visit: Payer: PPO | Admitting: Hematology

## 2015-08-31 ENCOUNTER — Inpatient Hospital Stay (HOSPITAL_COMMUNITY): Payer: Non-veteran care

## 2015-08-31 ENCOUNTER — Other Ambulatory Visit: Payer: PPO

## 2015-08-31 ENCOUNTER — Ambulatory Visit: Payer: PPO

## 2015-08-31 DIAGNOSIS — E43 Unspecified severe protein-calorie malnutrition: Secondary | ICD-10-CM | POA: Insufficient documentation

## 2015-08-31 DIAGNOSIS — R1314 Dysphagia, pharyngoesophageal phase: Secondary | ICD-10-CM | POA: Insufficient documentation

## 2015-08-31 DIAGNOSIS — F332 Major depressive disorder, recurrent severe without psychotic features: Secondary | ICD-10-CM | POA: Diagnosis present

## 2015-08-31 LAB — GLUCOSE, CAPILLARY
GLUCOSE-CAPILLARY: 124 mg/dL — AB (ref 65–99)
GLUCOSE-CAPILLARY: 128 mg/dL — AB (ref 65–99)
GLUCOSE-CAPILLARY: 162 mg/dL — AB (ref 65–99)
Glucose-Capillary: 106 mg/dL — ABNORMAL HIGH (ref 65–99)
Glucose-Capillary: 100 mg/dL — ABNORMAL HIGH (ref 65–99)
Glucose-Capillary: 108 mg/dL — ABNORMAL HIGH (ref 65–99)

## 2015-08-31 LAB — CBC
HEMATOCRIT: 31.5 % — AB (ref 39.0–52.0)
HEMOGLOBIN: 10.4 g/dL — AB (ref 13.0–17.0)
MCH: 29.1 pg (ref 26.0–34.0)
MCHC: 33 g/dL (ref 30.0–36.0)
MCV: 88.2 fL (ref 78.0–100.0)
Platelets: 358 10*3/uL (ref 150–400)
RBC: 3.57 MIL/uL — AB (ref 4.22–5.81)
RDW: 18.5 % — ABNORMAL HIGH (ref 11.5–15.5)
WBC: 22.8 10*3/uL — ABNORMAL HIGH (ref 4.0–10.5)

## 2015-08-31 LAB — BASIC METABOLIC PANEL
ANION GAP: 9 (ref 5–15)
BUN: 37 mg/dL — ABNORMAL HIGH (ref 6–20)
CALCIUM: 8.7 mg/dL — AB (ref 8.9–10.3)
CO2: 31 mmol/L (ref 22–32)
Chloride: 107 mmol/L (ref 101–111)
Creatinine, Ser: 0.6 mg/dL — ABNORMAL LOW (ref 0.61–1.24)
Glucose, Bld: 118 mg/dL — ABNORMAL HIGH (ref 65–99)
POTASSIUM: 3.2 mmol/L — AB (ref 3.5–5.1)
Sodium: 147 mmol/L — ABNORMAL HIGH (ref 135–145)

## 2015-08-31 LAB — TYPE AND SCREEN
ABO/RH(D): B NEG
ANTIBODY SCREEN: NEGATIVE
Unit division: 0
Unit division: 0

## 2015-08-31 MED ORDER — ARIPIPRAZOLE 2 MG PO TABS
2.0000 mg | ORAL_TABLET | Freq: Two times a day (BID) | ORAL | Status: DC
  Filled 2015-08-31 (×4): qty 1

## 2015-08-31 MED ORDER — POTASSIUM CHLORIDE CRYS ER 20 MEQ PO TBCR: 20.0000 meq | EXTENDED_RELEASE_TABLET | ORAL | Status: DC

## 2015-08-31 MED ORDER — FAT EMULSION 20 % IV EMUL
240.0000 mL | INTRAVENOUS | Status: DC
  Administered 2015-08-31: 240 mL via INTRAVENOUS
  Filled 2015-08-31: qty 250

## 2015-08-31 MED ORDER — DULOXETINE HCL 30 MG PO CPEP: 30.0000 mg | ORAL_CAPSULE | Freq: Every day | ORAL | Status: DC

## 2015-08-31 MED ORDER — INSULIN ASPART 100 UNIT/ML ~~LOC~~ SOLN
0.0000 [IU] | SUBCUTANEOUS | Status: DC
  Administered 2015-09-01: 2 [IU] via SUBCUTANEOUS

## 2015-08-31 MED ORDER — POTASSIUM CHLORIDE CRYS ER 20 MEQ PO TBCR: 40.0000 meq | EXTENDED_RELEASE_TABLET | Freq: Once | ORAL | Status: DC

## 2015-08-31 MED ORDER — DEXAMETHASONE SODIUM PHOSPHATE 4 MG/ML IJ SOLN
2.0000 mg | INTRAMUSCULAR | Status: DC
  Administered 2015-08-31 – 2015-09-01 (×2): 2 mg via INTRAVENOUS
  Filled 2015-08-31 (×2): qty 1

## 2015-08-31 MED ORDER — POTASSIUM CHLORIDE CRYS ER 20 MEQ PO TBCR
20.0000 meq | EXTENDED_RELEASE_TABLET | Freq: Once | ORAL | Status: AC
  Administered 2015-08-31: 20 meq via ORAL
  Filled 2015-08-31 (×2): qty 1

## 2015-08-31 MED ORDER — POTASSIUM CHLORIDE CRYS ER 20 MEQ PO TBCR
30.0000 meq | EXTENDED_RELEASE_TABLET | ORAL | Status: AC
  Administered 2015-08-31: 30 meq via ORAL
  Filled 2015-08-31 (×2): qty 1

## 2015-08-31 MED ORDER — TRACE MINERALS CR-CU-MN-SE-ZN 10-1000-500-60 MCG/ML IV SOLN
INTRAVENOUS | Status: DC
  Administered 2015-08-31: 22:00:00 via INTRAVENOUS
  Filled 2015-08-31: qty 960

## 2015-08-31 MED ORDER — TRAVASOL 10 % IV SOLN: INTRAVENOUS | Status: DC

## 2015-08-31 MED ORDER — FUROSEMIDE 10 MG/ML IJ SOLN
40.0000 mg | Freq: Once | INTRAMUSCULAR | Status: AC
  Administered 2015-08-31: 40 mg via INTRAVENOUS
  Filled 2015-08-31: qty 4

## 2015-08-31 NOTE — Progress Notes (Signed)
[place] Progress Note Patient Name: Ethan Stewart DOB: 21-Dec-1946 MRN: [id number]   Date of Service  08/31/2015  HPI/Events of Note  Swallow evaluation report review. Will opt for the more conservative diet. NPO with ice chips and sips.   eICU Interventions  Will make the patient NPO with ice chips and sips.      Intervention Category Intermediate Interventions: Other:  Lysle Dingwall 08/31/2015, 4:24 PM

## 2015-08-31 NOTE — Evaluation (Signed)
Occupational Therapy Evaluation Patient Details Name: Ethan Stewart MRN: [id number] DOB: 1946/07/03 Today's Date: 08/31/2015    History of Present Illness 69 y.o. M with diffuse large B cell lymphoma, brought to [facility] ED 8/29 with HCAP. Developed worsening hypoxia and respiratory distress, Intubated 8/30-08/29/15. H/O Cervical corpectomy for tumor with residual RUE weakness on 07/26/15.   Clinical Impression   Pt was admitted for the above.  He has needed assistance with ADLs since corpectomy.  Pt will benefit from skilled OT to increase UE strength for adls and balance/mobility to decrease burden of care    Follow Up Recommendations  SNF    Equipment Recommendations   (to be further assessed)    Recommendations for Other Services       Precautions / Restrictions Precautions Precautions: Fall Precaution Comments: profound weakness Required Braces or Orthoses: Cervical Brace Cervical Brace: Hard collar;At all times Restrictions Weight Bearing Restrictions: No      Mobility Bed Mobility Overal bed mobility: Needs Assistance Bed Mobility: Rolling;Supine to Sit;Sit to Supine Rolling: Max assist;+2 for physical assistance;+2 for safety/equipment   Supine to sit: Max assist;+2 for physical assistance;+2 for safety/equipment Sit to supine: Max assist;+2 for physical assistance;+2 for safety/equipment   General bed mobility comments: patient asisted  25% moving legs to the edge of the bed, did scoot  trunk forward with a rocking motion, cues for safety, used UE's on bed to scoot,   Transfers                 General transfer comment: NT, not deemed to have trunk control enough nor strength in legs.    Balance Overall balance assessment: Needs assistance Sitting-balance support: Bilateral upper extremity supported Sitting balance-Leahy Scale: Poor Sitting balance - Comments: trunk flexed, cues to extend neck and trunk in sitting, able to suppport trunk more when leaning  forward and weight through BUE's. sat at edge x 10 minutes.  Min guard to mod A given to sit EOB Postural control: Posterior lean                                  ADL Overall ADL's : Needs assistance/impaired     Grooming: Maximal assistance;Bed level   Upper Body Bathing: Maximal assistance;Sitting   Lower Body Bathing: Total assistance;+2 for physical assistance;Bed level   Upper Body Dressing : Total assistance;Sitting   Lower Body Dressing: Total assistance;+2 for physical assistance;Bed level       Toileting- Clothing Manipulation and Hygiene: Total assistance;+2 for physical assistance;Bed level         General ADL Comments: assisted with peri care after sitting EOB.  Pt is able to use LUE for ADLs; PICC in LUE     Vision     Perception     Praxis      Pertinent Vitals/Pain Pain Assessment: Faces Pain Score: 0-No pain     Hand Dominance Left   Extremity/Trunk Assessment Upper Extremity Assessment Upper Extremity Assessment: RUE deficits/detail RUE Deficits / Details: able to slightly lift RUE:  this has been a problem prior to admission, per wife since he had cervical problem.  Strength 2-5; elbow 2-5, wrist 3/5 and fingers 4/5 LUE Deficits / Details: generalized weakness, grossly 3/5; grip 4/5       Cervical / Trunk Assessment Cervical / Trunk Exceptions: cervical collar.  Kyphotic    Communication Communication Communication:  (very little verbalization)   Cognition Arousal/Alertness:  Awake/alert Behavior During Therapy: Flat affect Overall Cognitive Status: Difficult to assess                 General Comments: patient stated several time: get me a walker. decreased insight:  had difficulty sitting unsupported EOB   General Comments       Exercises       Shoulder Instructions      Home Living Family/patient expects to be discharged to:: Unsure Living Arrangements: Spouse/significant other Available Help at  Discharge: Family Type of Home: House Home Access: Stairs to enter Technical brewer of Steps: 3 Entrance Stairs-Rails: Right;Left Home Layout: One level               Home Equipment: None          Prior Functioning/Environment Level of Independence: Needs assistance             OT Diagnosis: Generalized weakness   OT Problem List: Decreased strength;Decreased activity tolerance;Impaired balance (sitting and/or standing);Impaired UE functional use   OT Treatment/Interventions: Self-care/ADL training;Therapeutic exercise;Therapeutic activities;Patient/family education;Balance training    OT Goals(Current goals can be found in the care plan section) Acute Rehab OT Goals Patient Stated Goal: per wife, to get stronger. OT Goal Formulation: With patient/family Time For Goal Achievement: 09/14/15 Potential to Achieve Goals: Fair ADL Goals Additional ADL Goal #1: pt will sit unsupported EOB with min guard x 5 minutes in prep for adls Additional ADL Goal #2: pt will go from sit to stand with mod A x 2 and maintain for 2 minutes for adls. Additional ADL Goal #3: Pt will perform grooming and UB adls from supported sitting with min A  OT Frequency: Min 2X/week   Barriers to D/C:            Co-evaluation   Reason for Co-Treatment: Complexity of the patient's impairments (multi-system involvement) PT goals addressed during session: Mobility/safety with mobility OT goals addressed during session: Strengthening/ROM      End of Session    Activity Tolerance: Patient limited by fatigue Patient left: in bed;with call bell/phone within reach;with nursing/sitter in room;with family/visitor present   Time: 6389-3734 OT Time Calculation (min): 35 min Charges:  OT General Charges $OT Visit: 1 Procedure OT Evaluation $Initial OT Evaluation Tier I: 1 Procedure G-Codes:    Brandin Dilday 09/09/15, 3:35 PM  Lesle Chris, OTR/L [phone] 09/09/15

## 2015-08-31 NOTE — Progress Notes (Signed)
PULMONARY / CRITICAL CARE MEDICINE   Name: Ethan Stewart MRN: [id number] DOB: [birth year]    ADMISSION DATE:  08/20/2015 CONSULTATION DATE:  08/31/2015  REFERRING MD : Wynelle Cleveland  CHIEF COMPLAINT: SOB  INITIAL PRESENTATION: 69 y.o. M with diffuse large B cell lymphoma, brought to [facility] ED 8/29 with HCAP & ARDS.  R-CHOP Chemo Rx last given on 08/09/2015.  Status post C5 corpectomy on 8/3.  STUDIES:  CXR 8/29 >>> worsened central lung alveolar opacities bilaterally, suspicious for infectious infiltrates. CTA chest 8/29 >>> b/l airspace disease c/w atypical PNA or non-cardiogenic edema. No PE. CXR 9/2 >> sub cut emphysema RT neck  SIGNIFICANT EVENTS: 8/29  Admitted 8/30  Intubated, ARDS protocol 9/03  Off pressors, diuresis neg 2.2 liters 9/07  Febrile, no pain, wants to go home, good UOP    SUBJECTIVE:  Wife very concerned about patients affect / mood.  Notes he is depressed, repetitive with statement "I want to go home".  Pt denies acute physical complaints.  Expresses concern that he thinks the hospital is killing him and he would be better off at home.      VITAL SIGNS: Temp:  [97.5 F (36.4 C)-100.9 F (38.3 C)] 99.9 F (37.7 C) (09/08 0600) Resp:  [21-44] 27 (09/08 0600) BP: (113-151)/(52-82) 129/69 mmHg (09/08 0600) SpO2:  [96 %-100 %] 97 % (09/08 0600) Weight:  [189 lb 13.1 oz (86.1 kg)] 189 lb 13.1 oz (86.1 kg) (09/08 0500)   HEMODYNAMICS:     VENTILATOR SETTINGS:     INTAKE / OUTPUT: Intake/Output      09/07 0701 - 09/08 0700 09/08 0701 - 09/09 0700   P.O.     I.V. (mL/kg) 450 (5.2)    Blood 335    NG/GT     IV Piggyback 550 200   Total Intake(mL/kg) 1335 (15.5) 200 (2.3)   Urine (mL/kg/hr) 1700 (0.8)    Stool     Total Output 1700     Net -365 +200          PHYSICAL EXAMINATION: General: Adult male in NAD, C- collar in place Neuro: awake, follows commands, generalized weakness, gross motor only of RUE HEENT: PERRL  Cardiovascular: RRR, no  MRG Lungs: even/non-labored, lungs bilaterally with good air movement, mildly coarse Abdomen: Soft, non-tender, non-distended, no r/g Musculoskeletal: No acute deformity, C-Collar in place Skin: Grossly intact  LABS:  CBC  Recent Labs Lab 08/29/15 0310 08/30/15 0552 08/31/15 0535  WBC 18.1* 18.1* 22.8*  HGB 8.7* 7.9* 10.4*  HCT 27.2* 25.1* 31.5*  PLT 410* 359 358   Coag's No results for input(s): APTT, INR in the last 168 hours.   BMET  Recent Labs Lab 08/29/15 0310 08/30/15 0552 08/31/15 0535  NA 148* 146* 147*  K 4.4 3.7 3.2*  CL 106 107 107  CO2 37* 32 31  BUN 49* 45* 37*  CREATININE 0.57* 0.68 0.60*  GLUCOSE 142* 157* 118*   Electrolytes  Recent Labs Lab 08/25/15 0430  08/28/15 0416 08/29/15 0310 08/30/15 0552 08/31/15 0535  CALCIUM 8.3*  < > 8.6* 8.4* 8.4* 8.7*  MG 2.7*  --  2.6* 2.5*  --   --   PHOS 3.3  --  3.8 3.5  --   --   < > = values in this interval not displayed.   Sepsis Markers No results for input(s): LATICACIDVEN, PROCALCITON, O2SATVEN in the last 168 hours.   ABG  Recent Labs Lab 08/28/15 1606 08/29/15 1000  PHART 7.469* 7.482*  PCO2ART 48.9* 46.5*  PO2ART 86.6 62.9*   Liver Enzymes  Recent Labs Lab 08/27/15 0451  AST 53*  ALT 30  ALKPHOS 98  BILITOT 0.7  ALBUMIN 2.1*   Cardiac Enzymes No results for input(s): TROPONINI, PROBNP in the last 168 hours.   Glucose  Recent Labs Lab 08/30/15 0412 08/30/15 0833 08/30/15 1220 08/30/15 1635 08/30/15 2033 08/30/15 2357  GLUCAP 113* 115* 171* 99 165* 106*    Imaging No results found.   ASSESSMENT / PLAN:  PULMONARY ETT 8/30 >>9/6 A:  ARDS - improved HCAP - cx neg. DD incl G-CSF related pneumonitis Barotrauma - sub cut emphysema 9/2 Pneumomediastinum from ARDS P:  Intermittent CXR  Push pulmonary hygiene Reduce decadron to 2mg  Q24 on 9/8 (? Pneumonitis)  CARDIOVASCULAR A:  Mild troponin leak - suspect demand ischemia. Hypotension - due to  PEEP/sedatives HTN - 9/5 off sedation P:  Tele monitoring  Lasix 40 mg x1 9/8 + KCL  RENAL A:  Hypernatremia Hypokalemia  P:  KVO IVF Monitor BMP / UOP  Replace electrolytes as indicated   GASTROINTESTINAL A:  GERD. Protein calorie malnutrition. Loose stools - resolved.   P:  Pantoprazole Swallow eval & advance diet as tolerated  D1 diet, nectar thick liquids  HEMATOLOGIC / ONCOLOGIC A:  Diffuse large B cell lymphoma (follows with Dr. Vivien Rossetti) s/p 1 cycle of R-CHOP Anemia. VTE Prophylaxis. P:  SCD's / Lovenox  INFECTIOUS A:  HCAP / ? Atypical PNA Fever - ? Malignancy related as all cultures have been negative  P:  BCx2 8/29 >>neg UCx 8/29 >> neg Sputum Cx 8/29 >> neg PCP stain 8/29 >> neg U. Legionella 8/29 >> neg U. Strep 8/29 >> neg RVP 8/29 nasal swab >> neg RVP 8/30 BAL >> neg Tracheal Aspirate 9/5 >> few candida albicans  Zosyn, start date 8/29 >> Azithro, start date 8/29 >> 9/2 Bactrim, start date 8/29 >> 8/31 Vanc 8/29 >> 9/1 Zosyn 8/28 >> 9/5 Vanco 9/5 >> Ceftazidime 9/5 >>  Assess C-Diff if loose stools reoccur  ID following, appreciate input   ENDOCRINE A:  At risk for steroid induced hyperglycemia.  P:  SSI - sensitive   NEUROLOGIC A:  Anxiety - on valium Hx C5 tumor - s/p C5 corpectomy with C4-C6 arthrodesis and anterior cervical plating of C4-C6 (Dr. Ronnald Ramp - 07/26/15) Critical Illness Deconditioning  Depression  P: PT following - SNF plan OT consult Push PT efforts PSY consult for depression / paranoia  Will defer initiation of depression regimen to PSY  Code Status: Full.  Family updated: Wife updated at bedside  Interdisciplinary Family Meeting v Palliative Care Meeting: ongoing    GLOBAL:  Extensive discussion with wife and patient regarding current clinical state & overall goals of care.  The patient has a difficult time expressing his wishes.  He continues to repeat he "wants to go home"  and that the hospital "has made him sicker".  Encouraged the patient and his wife to discuss what he wants for plan of care - if he would want to go back on a ventilator, CPR, goals for chemotherapy etc.     PCCM will sign off.  Transfer primary SVC back to [facility] as of 9/9 0700    Noe Gens, NP-C [facility] Pgr: [phone] or if no answer [phone] 08/31/2015, 8:21 AM

## 2015-08-31 NOTE — Progress Notes (Signed)
Speech Language Pathology Treatment: Dysphagia  Patient Details Name: Ethan Stewart MRN: [id number] DOB: [birth year] Today's Date: 08/31/2015 Time: 0093-8182 SLP Time Calculation (min) (ACUTE ONLY): 10 min  Assessment / Plan / Recommendation Clinical Impression  Pt seen today to interview him re: premorbid swallow function and educate pt/family re: indication for instrumental evaluation.  Pt admits to premorbid coughing more with thin liquids than solids x2 weeks prior to admission.  He did not report any significant medical change but admits to decreased phonatory strength and dysarthria during this time - ? Deconditioning contributing.  Pt admitted to transient dysphagia s/p cervical spine surgery. Wet voice noted x1 following congested cough at baseline.      Pt/spouse educated that RN reported pt coughing with intake - and SLP concerns for aspiration with h/o prolonged intubation, weakness and cervical spine surgery.  MBS planned for approximately 1400 today - wife reports she may not be present as she desires to go home to rest.  Recommend pt consume only a few bites of his Genene Churn as tolerated- stopping intake if coughing-  and single ice chips pending MBS today.     HPI Other Pertinent Information: 69 y.o. male with a history of Diffuse Large B-Cell Lymphoma diagnosed 07/2015 on R-CHOP Chemo Rx, GERD, bone cancer, cervical spine tumor, bleeding ulcers admitted with complaints of fevers and chills and cough with SOB x 2 days. Pt underwent s/p C5 corpectomy with C4-C6 arthrodesis and anterior cervical plating of C4-C6 8/13. Intubated 8/30-9/6. CXR 9/5 progressive opacification and volume loss in the right lower lobe concerning for increasing atelectasis versus developing infiltrate and repeat CXR 9/7 there is subcutaneous air, primarily on the right, but no pneumothorax appreciable. Patchy airspace disease in the bases is stable. No new opacity. No change in cardiac silhouette. RN reported coughing  after ice chips and BSE obtained yesterday with pt being placed on modified diet.  Today pt seen to inform his/wife of this SLP recommendation for MBS due to mulitple risk factors for silent nature of dysphagia.     Pertinent Vitals Pain Assessment: No/denies pain  SLP Plan  MBS (today)    Recommendations Medication Administration: Crushed with puree              Plan: MBS (today)    [place], [facility] SLP [phone]

## 2015-08-31 NOTE — Progress Notes (Signed)
08/31/15  2836  Paged Dr Elsworth Soho regarding pt having ice chips, per wife request.  Per MD okay to have ice chips.

## 2015-08-31 NOTE — Progress Notes (Addendum)
PARENTERAL NUTRITION CONSULT NOTE - INITIAL  Pharmacy Consult for TPN Indication: NPO and severe protein-calorie malnutrition  No Known Allergies  Patient Measurements: Height: 6\' 1"  (185.4 cm) Weight: 189 lb 13.1 oz (86.1 kg) IBW/kg (Calculated) : 79.9  Vital Signs: Temp: 97.9 F (36.6 C) (09/08 1929) Temp Source: Oral (09/08 1929) BP: 149/70 mmHg (09/08 1800) Intake/Output from previous day: 09/07 0701 - 09/08 0700 In: 1335 [I.V.:450; Blood:335; IV Piggyback:550] Out: 1700 [Urine:1700] Intake/Output from this shift:    Labs:  Recent Labs  08/29/15 0310 08/30/15 0552 08/31/15 0535  WBC 18.1* 18.1* 22.8*  HGB 8.7* 7.9* 10.4*  HCT 27.2* 25.1* 31.5*  PLT 410* 359 358     Recent Labs  08/29/15 0310 08/30/15 0552 08/31/15 0535  NA 148* 146* 147*  K 4.4 3.7 3.2*  CL 106 107 107  CO2 37* 32 31  GLUCOSE 142* 157* 118*  BUN 49* 45* 37*  CREATININE 0.57* 0.68 0.60*  CALCIUM 8.4* 8.4* 8.7*  MG 2.5*  --   --   PHOS 3.5  --   --    Estimated Creatinine Clearance: 98.5 mL/min (by C-G formula based on Cr of 0.6).    Recent Labs  08/31/15 0749 08/31/15 1149 08/31/15 1556  GLUCAP 128* 162* 100*    Medical History: Past Medical History  Diagnosis Date  . History of bleeding ulcers 1990's  . 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 Anxiety   . GERD (gastroesophageal reflux disease)   . H. pylori infection   . History of blood transfusion   . Bone cancer      tumor on C5  . Arm numbness 08/07/15    Limited movement due to tumor on spine  . Diffuse large B cell lymphoma     biposy 07/25/2105    Medications:  Scheduled:  . ARIPiprazole  2 mg Oral BID  . cefTAZidime (FORTAZ)  IV  2 g Intravenous 3 times per day  . dexamethasone  2 mg Intravenous Q24H  . DULoxetine  30 mg Oral Daily  . enoxaparin (LOVENOX) injection  40 mg Subcutaneous Q24H  . insulin aspart  0-9 Units Subcutaneous 6 times per day  . pantoprazole sodium  40 mg Per Tube Daily  . sodium  chloride  10-40 mL Intracatheter Q12H  . vancomycin  1,000 mg Intravenous Q8H   Infusions:    Insulin Requirements: 6 units  Current Nutrition: NPO  IVF: none  Central access: PICC  TPN start date: 9/8  ASSESSMENT                                                                                                          HPI: 69yo M admitted with fever and SOB. Intubated for ARDs and pneumonia.   Significant events:  9/6: extubated 9/8: swallow eval showed aspiration risk. Dr. Irene Limbo opted to start parenteral nutrition.   Today:   Glucose - On dexamethasone but controlled with SSI prior to TPN.  Electrolytes - Low K this AM, received 33meq PO today. CorrCa 10.2.  Renal - SCr  wnl.  LFTs - wnl.  TGs - 97 (9/5)  Prealbumin - none  NUTRITIONAL GOALS                                                                                             RD recs: pending Clinimix E 5/15 at a goal rate of 14ml/hr + 20% fat emulsion at 31ml/hr to provide: 132g/day protein, 2354Kcal/day.  PLAN                                                                                                                          Start Clinimix E 5/15 at 68ml/hr.  20% fat emulsion at 62ml/hr.  Plan to advance as tolerated to the goal rate.  TPN to contain standard multivitamins and trace elements.  Increase SSI to moderate.   TPN lab panels on Mondays & Thursdays.  Give an additional 37meq KCl PO tonight.   Suggest transition to enteral/tube feedings as soon as possible.  F/u daily.  Romeo Rabon, PharmD, pager [phone]. 08/31/2015,7:51 PM.

## 2015-08-31 NOTE — Progress Notes (Signed)
Physical Therapy Treatment Patient Details Name: MICHAEL WALRATH MRN: [id number] DOB: 1946/10/15 Today's Date: 08/31/2015    History of Present Illness 69 y.o. M with diffuse large B cell lymphoma, brought to [facility] ED 8/29 with HCAP. Developed worsening hypoxia and respiratory distress, Intubated 8/30-08/29/15. H/O C % corpectomy for tumor with residual RUE weakness on 07/26/15.    PT Comments    Patient is stronger and more alert. Wants to stand with a walker but  Remains too weak to safely attempt without lift equipment. Patient participated in  Leg exercises on edge of bed. Malodorous sacral sore, incontinent of BM. RN in to clean and change  The  [place]  Follow Up Recommendations  SNF;Supervision/Assistance - 24 hour     Equipment Recommendations  None recommended by PT    Recommendations for Other Services       Precautions / Restrictions Precautions Precautions: Fall Precaution Comments: profound weakness Required Braces or Orthoses: Cervical Brace Cervical Brace: Hard collar;At all times    Mobility  Bed Mobility Overal bed mobility: Needs Assistance Bed Mobility: Rolling;Supine to Sit;Sit to Supine Rolling: Max assist;+2 for physical assistance;+2 for safety/equipment   Supine to sit: Max assist;+2 for physical assistance;+2 for safety/equipment Sit to supine: Max assist;+2 for physical assistance;+2 for safety/equipment   General bed mobility comments: patient asisted  25% moving legs to the edge of the bed, did scoot  trunk forward with a rocking motion, cues for safety, used UE's on bed to scoot,   Transfers                 General transfer comment: NT, not deemed to have trunk control enough nor strength in legs.  Ambulation/Gait                 Stairs            Wheelchair Mobility    Modified Rankin (Stroke Patients Only)       Balance Overall balance assessment: Needs assistance Sitting-balance support: Bilateral upper extremity  supported;Feet supported Sitting balance-Leahy Scale: Poor Sitting balance - Comments: trunk flexed, cues to extend neck and trunk in sitting, able to suppport trunk more when leaning forward and weight through BUE's. sat at edge x 10 minutes. Postural control: Posterior lean;Left lateral lean                          Cognition Arousal/Alertness: Awake/alert Behavior During Therapy: Flat affect Overall Cognitive Status: Difficult to assess                 General Comments: patient stated several time: get me a walker.     Exercises      General Comments        Pertinent Vitals/Pain Pain Assessment: Faces Pain Score: 0-No pain    Home Living                      Prior Function            PT Goals (current goals can now be found in the care plan section) Progress towards PT goals: Progressing toward goals    Frequency  Min 3X/week    PT Plan Current plan remains appropriate    Co-evaluation PT/OT/SLP Co-Evaluation/Treatment: Yes Reason for Co-Treatment: Complexity of the patient's impairments (multi-system involvement);For patient/therapist safety PT goals addressed during session: Mobility/safety with mobility;Balance;Strengthening/ROM OT goals addressed during session: ADL's and self-care;Strengthening/ROM     End  of Session   Activity Tolerance: Patient tolerated treatment well Patient left: in bed;with call bell/phone within reach;with bed alarm set;with family/visitor present     Time: 4982-6415 PT Time Calculation (min) (ACUTE ONLY): 35 min  Charges:  $Therapeutic Activity: 8-22 mins                    G Codes:      Claretha Cooper 08/31/2015, 2:49 PM

## 2015-08-31 NOTE — Progress Notes (Signed)
Patient's wife called last night and says that she is concerned about his emotional state shortly before this hospital admission and during this admission. She is requesting a psych consult for patient. Suicide risk assessment was done and documented. Based on assessment no suicide precautions at this time. Patient agreed to spiritual care consult. Sticky note placed on chart to notify MD to address.

## 2015-08-31 NOTE — Progress Notes (Addendum)
SLP Cancellation Note  Patient Details Name: DECKLYN HYDER MRN: [id number] DOB: [birth year]   Cancelled treatment:       Reason Eval/Treat Not Completed: Other (comment) (per RN, pt coughing with all intake, given multiple risk factors for pharyngeal dysphagia including cervical spine sx, extended intubation, symptoms of dysphagia - recommend MBS to allow instrumental evaluation).  Spoke to critical care service NP and order obtained.    Luanna Salk, [place] [facility] SLP [phone]

## 2015-08-31 NOTE — Progress Notes (Signed)
[name] Kitchen   HEMATOLOGY/ONCOLOGY INPATIENT PROGRESS NOTE   Date of Service: 08/31/2015  Inpatient Attending: .Rigoberto Noel, MD   SUBJECTIVE  Ethan Stewart was seen this evening. More awake and alert and talkative. Wife Ethan Stewart at bedside. Notes that he misses being at home with his son, grandkid and dog and taking morning walks with his wife Ethan Stewart. Seen by psych for depression and apparently some psychotic thoughts in the setting of delirium and was started on Cymbalta and Abilify.made NPO except Ice chips per ST. Patient with precarious nutritional status and cannot afford to have drop in caloric intake. Discussed options of temporary corpak [place] tube with enteral feeding (peferred) vs TPN. Patient absolutely did not want a NJ tube and so started on TPN. He was encouraged to participate with PT/OT and ST and focus on nutrition and therapies at this time. We discussed that his functional status is going to be key with regards to further chemotherapy treatment choices. He notes that he understands this. He notes that he really wants the urinary catheter out since it's so humiliating. Discussed with RN and requested removal of foleys and use of urinal and if needed to place condom catheter.   OBJECTIVE:  PHYSICAL EXAMINATION: . Filed Vitals:   08/31/15 1800 08/31/15 1929 08/31/15 2000 08/31/15 2200  BP: 149/70  134/70 134/55  Pulse:      Temp: 99 F (37.2 C) 97.9 F (36.6 C)    TempSrc:  Oral    Resp: '23  17 25  ' Height:      Weight:      SpO2: 96%  90% 90%   GENERAL: awake, breathing comfortably. SKIN: no acute rashes EYES: open, tracking movement OROPHARYNX: no exudates NECK: no JVD, C- collar on LYMPH:  no palpable lymphadenopathy in the cervical, axillary LUNGS:CTA b/l HEART: regular rate & rhythm,  no murmurs and bilateral lower extremity 1+ edema. ABDOMEN: abdomen soft, non-tender, normoactive bowel sounds  NEURO: alert , oriented x 3, baseline RUE weakness overall appears. PSYCH:  somewhat sad affect. But goal oriented. No suicidal or homicidal ideation. No overt delusions. Delirium resolving.    ALLERGIES:  has No Known Allergies.  MEDICATIONS:  Scheduled Meds: . ARIPiprazole  2 mg Oral BID  . cefTAZidime (FORTAZ)  IV  2 g Intravenous 3 times per day  . dexamethasone  2 mg Intravenous Q24H  . DULoxetine  30 mg Oral Daily  . enoxaparin (LOVENOX) injection  40 mg Subcutaneous Q24H  . [START ON 09/01/2015] insulin aspart  0-15 Units Subcutaneous 6 times per day  . pantoprazole sodium  40 mg Per Tube Daily  . sodium chloride  10-40 mL Intracatheter Q12H  . vancomycin  1,000 mg Intravenous Q8H   Continuous Infusions: . [name] KitchenTPN (CLINIMIX-E) Adult 40 mL/hr at 08/31/15 2134   And  . [START ON 09/01/2015] fat emulsion 240 mL (08/31/15 2137)   PRN Meds:.acetaminophen **OR** acetaminophen, albuterol, diazepam, neomycin-bacitracin-polymyxin, [DISCONTINUED] ondansetron **OR** ondansetron (ZOFRAN) IV, RESOURCE THICKENUP CLEAR, sodium chloride, sodium chloride  REVIEW OF SYSTEMS:    10 Point review of Systems was done is negative except as noted above.   LABORATORY DATA: . CBC  . CBC Latest Ref Rng 08/31/2015 08/30/2015 08/29/2015  WBC 4.0 - 10.5 K/uL 22.8(H) 18.1(H) 18.1(H)  Hemoglobin 13.0 - 17.0 g/dL 10.4(L) 7.9(L) 8.7(L)  Hematocrit 39.0 - 52.0 % 31.5(L) 25.1(L) 27.2(L)  Platelets 150 - 400 K/uL 358 359 410(H)    . CMP Latest Ref Rng 08/31/2015 08/30/2015 08/29/2015  Glucose 65 -  99 mg/dL 118(H) 157(H) 142(H)  BUN 6 - 20 mg/dL 37(H) 45(H) 49(H)  Creatinine 0.61 - 1.24 mg/dL 0.60(L) 0.68 0.57(L)  Sodium 135 - 145 mmol/L 147(H) 146(H) 148(H)  Potassium 3.5 - 5.1 mmol/L 3.2(L) 3.7 4.4  Chloride 101 - 111 mmol/L 107 107 106  CO2 22 - 32 mmol/L 31 32 37(H)  Calcium 8.9 - 10.3 mg/dL 8.7(L) 8.4(L) 8.4(L)  Total Protein 6.5 - 8.1 g/dL - - -  Total Bilirubin 0.3 - 1.2 mg/dL - - -  Alkaline Phos 38 - 126 U/L - - -  AST 15 - 41 U/L - - -  ALT 17 - 63 U/L - - -      RADIOGRAPHIC STUDIES:ASSESSMENT & PLAN:   #1  Diffuse large B-cell lymphoma stage IV AE with involvement of mesenteric, inguinal lymph nodes and C5 vertebral body with spinal cord impingement.  He had a C5 corpectomy on 07/26/2015 that showed diffuse large B-cell lymphoma with a Ki-67 ranging from 20 to 90%.    DLBCL (diffuse large B cell lymphoma)   06/21/2015 Imaging CT Chest/abd/pelvis: IMPRESSION: 5 mm nodule within the right lower lobe as described.  Pneumobilia consistent with a prior sphincterotomy.  No acute abnormality is identified. No findings to suggest chest etiology for the known lesion at C5 are seen.   06/21/2015 Imaging MRI c spine1. Diffuse marrow replacement of the C5 vertebral body highly concerning for neoplasm (metastatic disease, myeloma, or lymphoma). Epidural tumor at this level results in moderate spinal stenosis with moderate impression on the spinal cord    07/10/2015 PET scan 1. The previously demonstrated abnormal C5 vertebral body is hypermetabolic. No other osseous lesions demonstrated. 2. There are small hypermetabolic lymph nodes within the left mesentery and both inguinal regions.    07/26/2015 Initial Biopsy Had a C5 corpectomy pathology showed diffuse large B-cell lymphoma   08/08/2015 -  Chemotherapy Started for cycle of R CHOP.    08/09/2015 -  Chemotherapy Received first cycle of intrathecal methotrexate plus hydrocortisone for CNS prophylaxis.   He is currently s/p 1 st cycle of R-CHOP on 08/08/2015 and was scheduled for 2nd cycle of R-CHOP on 08/31/2015.   We might need to dose adjust his chemotherapy once he is better to reduce the risk of neutropenic complications (though he really did not have prolonged neutropenia) since be might be hard pressed to not use any G-CSF. Plan Aggressive input by therapies Please try to get patient to recliner with the help of lift if needed. -wound care for sacral decubitus ulcer _TCU vs acute rehab placement (will need  facility that can handle TPN, wound care, transportation for chemotherapy) -We will need to determine the timing and intensity of his chemotherapy based on his clinical course from this point on. Considering that he is being treated with curative intent would try to avoid >1week delay in next cycle of chemotherapy if possible. (considering R-mini CHOP and will likely hold IT methotrexate prophylaxis this cycle)  #2 ARDS with fevers and bilateral groundglass pulmonary opacities. Noncardiogenic pulmonary edema normal BNP. Mild troponin leak due to demand ischemia which normalized.   Neulasta related ARDS versus HCAP.  Lungs much improved patient has been extubated and is currently only using 2 L of oxygen. Still some low-grade fevers. ID following.  Blood cultures negative thus far Viral respiratory panel negative Bronchoalveolar lavage with no overt organisms seen on Gram stain. PCP negative. CMV IgG positive IgM negative PCR qualitative positive.  Elevated sedimentation rate and CRP  now showing improvement which is a good sign.  Urine testing negative for Legionella antigen and Streptococcus pneumoniae. Repeat BAL showed Candida which is unlikely incidental organism.  #3 hypoxic respiratory failure due to #2 much improved now also went and with decreasing oxygen requirements.  #4 Anemia related to chemotherapy and lymphoma as well as possible sepsis. #5  leukocytosis likely due to Neulasta and steroids and less likely due to ongoing sepsis . #6 ICU delirium - meds/intubation/steroids/infection -improved today #7 protein calorie malnutrition #8 significant muscle wasting due to immobilization, steroids, nutritional limitations,, neoplasm. #9 symptomatically anemia due to chemotherapy plus multiple blood draws plus poor bone marrow reserve. Plan -on ceftazidime plus vancomycin in the setting of low-grade fevers per ID (4 more days) - may discontinue dexamethasone tomorrow. -We will have to  avoid using any G-CSF in the future. -aggressive dietary support (started on TPN today after extensive counseling regarding enteral v/s parenteral nutrition and risks and benefits) -only irradiated blood products for transfusion. -will continue to follow up daily. -foleys catheter removed. Support with bed urinal use and if not possible place condom catheter.  #10 Delirium -resolving rapidly #11 h/o MDD Plan -re-orienting activities. -active engagement in goal oriented therapies as much as possible would be most therapeutic. -getting off steroids and other sedatives as much as possible. -maintain sleep/wakeful cycles. -seen by psych and started on Cymbalta and Abilify. Would recommend psych team be mindful of possible pharmacologic drug interactions with his planned chemotherapy. Abilify especially has significant drug interactions with several medications metabolized through the cyp 3A4 and cyp 2D6 pathways which make it a difficult medication to manage with chemotherapy.  Will continue to follow. Appreciate excellent critical care/ pulmonary care and ID input. Provided update to his wife at bedside.   The total time spent  was 35 minutes and more than 50% was on counseling and direct patient cares.    Sullivan Lone MD [place] AAHIVMS [facility] Hematology/Oncology Physician [place]  (Office):       [phone] (Work cell):  [phone] (Fax):           [phone]

## 2015-08-31 NOTE — Progress Notes (Signed)
08/31/15  1320  Will place thickener in lemonade and cream of chicken soup.

## 2015-08-31 NOTE — Clinical Social Work Psych Assess (Signed)
Clinical Social Work Nature conservation officer  Clinical Social Worker:  Boone Master, [place] Date/Time:  08/31/2015, 3:06 PM Referred By:  Physician Date Referred:  08/31/15 Reason for Referral:  Behavioral Health Issues   Presenting Symptoms/Problems  Presenting Symptoms/Problems(in person's/family's own words):  Patient's family requested psych consult due to depression.   Abuse/Neglect/Trauma History  Abuse/Neglect/Trauma History:  Denies History Abuse/Neglect/Trauma History Comments (indicate dates):  N/A   Psychiatric History  Psychiatric History:  Inpatient/Hospitalization, Outpatient Treatment Psychiatric Medication:  None currently   Current Mental Health Hospitalizations/Previous Mental Health History:  Wife reports patient was diagnosed with depression about 25 years ago but has not had any follow up in the past 10-15 years.   Current Provider:  None currently Place and Date:  N/A  Current Medications:   Scheduled Meds: . cefTAZidime (FORTAZ)  IV  2 g Intravenous 3 times per day  . dexamethasone  2 mg Intravenous Q24H  . enoxaparin (LOVENOX) injection  40 mg Subcutaneous Q24H  . insulin aspart  0-9 Units Subcutaneous 6 times per day  . pantoprazole sodium  40 mg Per Tube Daily  . sodium chloride  10-40 mL Intracatheter Q12H  . vancomycin  1,000 mg Intravenous Q8H   Continuous Infusions:  PRN Meds:.acetaminophen **OR** acetaminophen, albuterol, diazepam, neomycin-bacitracin-polymyxin, [DISCONTINUED] ondansetron **OR** ondansetron (ZOFRAN) IV, RESOURCE THICKENUP CLEAR, sodium chloride, sodium chloride     Previous Inpatient Admission/Date/Reason:  Patient was at [facility] about 25 years ago for 10 days due to depression.   Emotional Health/Current Symptoms  Suicide/Self Harm: None Reported Suicide Attempt in Past (date/description):  None reported  Other Harmful Behavior (ex. homicidal ideation) (describe):  N/A   Psychotic/Dissociative  Symptoms  Psychotic/Dissociative Symptoms: Paranoia Other Psychotic/Dissociative Symptoms:  Family reports patient was paranoid that drs were trying to kill him. Patient reports that he does not trust people easily and just feels that hospital is not caring for all of his needs.   Attention/Behavioral Symptoms  Attention/Behavioral Symptoms: Withdrawn Other Attention/Behavioral Symptoms:  Patient avoids eye contact and stares at the wall during assessment.   Cognitive Impairment  Cognitive Impairment:  Within Normal Limits Other Cognitive Impairment:  Patient alert during assessment.   Mood and Adjustment  Mood and Adjustment:  Flat   Stress, Anxiety, Trauma, Any Recent Loss/Stressor  Stress, Anxiety, Trauma, Any Recent Loss/Stressor: None Reported Anxiety (frequency):  N/A  Phobia (specify):  N/A  Compulsive Behavior (specify):  N/A  Obsessive Behavior (specify):  N/A  Other Stress, Anxiety, Trauma, Any Recent Loss/Stressor:  N/A   Substance Abuse/Use  Substance Abuse/Use: None SBIRT Completed (please refer for detailed history): No Self-reported Substance Use (last use and frequency):  None reported  Urinary Drug Screen Completed: No Alcohol Level:  N/A   Environment/Housing/Living Arrangement  Environmental/Housing/Living Arrangement: Stable Housing Who is in the Home:  Wife  Emergency Contact:  Wife   Pharmacist, community: Multimedia programmer ([place])   Patient's Strengths and Goals  Patient's Strengths and Goals (patient's own words):  Patient has supportive family.   Clinical Social Worker's Interpretive Summary  Clinical Social Workers Interpretive Summary:    Psych MD and CSW rounded on patient together. Wife reports that patient was diagnosed with lymphoma about 2 months ago and had surgery. Patient was supposed to have chemo but developed negative side effects and had to be hospitalized. Patient has had a difficult time with treatment and had  to be intubated last week. Wife reports patient was extubated but fixated on going home and worried  he is not being treated for all illnesses. Wife reports a history of depression but no current treatment.  Patient withdrawn with flat affect. Patient reports he feels he needs to be treated so that he can go home. Patient denies any SI or HI or psychotic features. Patient reports he will stay for treatment at the hospital but always feels more comfortable at home.  Psych MD to make medication recommendations.  Disposition  Disposition: Recommend Psych CSW Continuing To Support While In [facility] [place]

## 2015-08-31 NOTE — Consult Note (Signed)
[facility] Consult   Reason for Consult:  depression Referring Physician:  Dr. Elsworth Soho Patient Identification: DOMENICO ACHORD MRN:  [id number] Principal Diagnosis: MDD (major depressive disorder), recurrent severe, without psychosis Diagnosis:   Patient Active Problem List   Diagnosis Date Noted  . MDD (major depressive disorder), recurrent severe, without psychosis [F33.2] 08/31/2015  . Pressure ulcer [L89.90] 08/30/2015  . Acute respiratory failure [J96.00]   . HCAP (healthcare-associated pneumonia) [J18.9] 08/21/2015  . Sepsis [A41.9] 08/21/2015  . Anemia associated with chemotherapy [D64.81, T45.1X5A] 08/21/2015  . Sinus tachycardia [I47.1] 08/21/2015  . GERD (gastroesophageal reflux disease) [K21.9] 08/21/2015  . Blood poisoning [A41.9]   . Hypoxia [R09.02]   . Acute on chronic respiratory failure with hypoxia [J96.21]   . ARDS (adult respiratory distress syndrome) [J80]   . S/P cervical spinal fusion [Z98.1] 07/26/2015  . DLBCL (diffuse large B cell lymphoma) [C83.30] 07/07/2015  . Neck pain, acute [M54.2] 07/04/2015  . Cervical spine tumor [D49.2] 07/03/2015  . Bone lesion [M89.9] 06/30/2015    Total Time spent with patient: 1 hour  Subjective:   Ethan Stewart is a 69 y.o. male patient admitted with depression and paranoia.  HPI:  Ethan Stewart is a 69 y.o. male seen in [facility] the intensive care unit face-to-face for psychiatric consultation along with psychiatric social service for increased symptoms of depression and also paranoia. Information for this evaluation obtained from the available medical records and also case discussed with the patient wife who is at the room and also has some family members. Patient is awake, alert and oriented to time place person but has poor insight and judgment. Patient reportedly has a history of depression 25 years ago and required hospitalized. Patient is currently suffering with diffuse Large B-Cell Lymphoma  diagnosed 07/2015 on [facility] Chemo Rx last given on 08/09/2015. Patient presented with multiple somatic symptoms and questionable reaction to some of the medication given to him. He was also diagnosed with pneumonia and  elevated lactic Acid level .  Reportedly patient has sepsis and required intubation which he was extubated 2 days ago. Patient currently feeling sad, depressed, restless, disturbed sleep and appetite and low self-esteem. Patient believes he need to get out of the bed and start walking with a walker and then go to home which he called [facility]. Patient also feels like his wife and son can care for him at home. Reportedly patient has a difficulty trusting the hospital doctors. Patient believes the medication or treatment is not working for him. There is a question about possible delirium and also he is quite different from his baseline functioning. Patient denies suicidal/homicidal ideation, intention or plans. Patient has no evidence of auditory or visual hallucinations. Patient is willing to take medication to help him for depression, insomnia and generalized body pains secondary to lymphoma.    HPI Elements:   Location:  Depression. Quality:  Poor. Severity:  Moderate to severe. Timing:  Recent diagnosis of lymphoma and treatment. Duration:  Few days. Context:  Multiple medical problems and complications.  Past Medical History:  Past Medical History  Diagnosis Date  . History of bleeding ulcers 1990's  . 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 Anxiety   . GERD (gastroesophageal reflux disease)   . H. pylori infection   . History of blood transfusion   . Bone cancer      tumor on C5  . Arm numbness 08/07/15    Limited movement due to tumor on  spine  . Diffuse large B cell lymphoma     biposy 07/25/2105    Past Surgical History  Procedure Laterality Date  . Hernia repair Bilateral     with mesh  . Colonoscopy    . Eye surgery Right     catheter  . Anterior cervical  corpectomy N/A 07/26/2015    Procedure: Cervical five Corpectomy/Cervical four-six Fusion/Plate ;  Surgeon: Eustace Moore, MD;  Location: [facility];  Service: Neurosurgery;  Laterality: N/A;  C5 Corpectomy/C4-6 Fusion/Plate C4-6   Family History:  Family History  Problem Relation Age of Onset  . Hypertension Mother    Social History:  History  Alcohol Use  . 3.0 oz/week  . 2 Glasses of wine, 3 Cans of beer per week     History  Drug Use No    Social History   Social History  . Marital Status: Married    Spouse Name: N/A  . Number of Children: N/A  . Years of Education: N/A   Social History Main Topics  . Smoking status: Former Smoker -- 1.00 packs/day for 10 years    Types: Cigarettes    Quit date: 12/23/1981  . Smokeless tobacco: Never Used  . Alcohol Use: 3.0 oz/week    2 Glasses of wine, 3 Cans of beer per week  . Drug Use: No  . Sexual Activity: Yes   Other Topics Concern  . None   Social History Narrative   Additional Social History:                          Allergies:  No Known Allergies  Labs:  Results for orders placed or performed during the hospital encounter of 08/20/15 (from the past 48 hour(s))  Blood gas, arterial     Status: Abnormal   Collection Time: 08/29/15 10:00 AM  Result Value Ref Range   FIO2 0.40    Delivery systems VENTILATOR    Mode CPAP/PS    Peep/cpap 5.0 cm H20   Pressure support 5.0 cm H20   pH, Arterial 7.482 (H) 7.350 - 7.450   pCO2 arterial 46.5 (H) 35.0 - 45.0 mmHg   pO2, Arterial 62.9 (L) 80.0 - 100.0 mmHg   Bicarbonate 34.0 (H) 20.0 - 24.0 mEq/L   TCO2 31.2 0 - 100 mmol/L   Acid-Base Excess 9.9 (H) 0.0 - 2.0 mmol/L   O2 Saturation 90.0 %   Patient temperature 100.6    Collection site RIGHT RADIAL    Drawn by [id number]    Sample type ARTERIAL DRAW   Glucose, capillary     Status: Abnormal   Collection Time: 08/29/15 12:01 PM  Result Value Ref Range   Glucose-Capillary 125 (H) 65 - 99 mg/dL   Comment 1  Notify RN    Comment 2 Document in Chart   Glucose, capillary     Status: Abnormal   Collection Time: 08/29/15  5:00 PM  Result Value Ref Range   Glucose-Capillary 120 (H) 65 - 99 mg/dL   Comment 1 Notify RN    Comment 2 Document in Chart   Glucose, capillary     Status: Abnormal   Collection Time: 08/29/15  8:11 PM  Result Value Ref Range   Glucose-Capillary 112 (H) 65 - 99 mg/dL  Glucose, capillary     Status: Abnormal   Collection Time: 08/29/15 11:40 PM  Result Value Ref Range   Glucose-Capillary 115 (H) 65 - 99 mg/dL  Glucose,  capillary     Status: Abnormal   Collection Time: 08/30/15  4:12 AM  Result Value Ref Range   Glucose-Capillary 113 (H) 65 - 99 mg/dL  Basic metabolic panel     Status: Abnormal   Collection Time: 08/30/15  5:52 AM  Result Value Ref Range   Sodium 146 (H) 135 - 145 mmol/L   Potassium 3.7 3.5 - 5.1 mmol/L   Chloride 107 101 - 111 mmol/L   CO2 32 22 - 32 mmol/L   Glucose, Bld 157 (H) 65 - 99 mg/dL   BUN 45 (H) 6 - 20 mg/dL   Creatinine, Ser 0.68 0.61 - 1.24 mg/dL   Calcium 8.4 (L) 8.9 - 10.3 mg/dL   GFR calc non Af Amer >60 >60 mL/min   GFR calc Af Amer >60 >60 mL/min    Comment: (NOTE) The eGFR has been calculated using the CKD EPI equation. This calculation has not been validated in all clinical situations. eGFR's persistently <60 mL/min signify possible Chronic Kidney Disease.    Anion gap 7 5 - 15  CBC     Status: Abnormal   Collection Time: 08/30/15  5:52 AM  Result Value Ref Range   WBC 18.1 (H) 4.0 - 10.5 K/uL   RBC 2.74 (L) 4.22 - 5.81 MIL/uL   Hemoglobin 7.9 (L) 13.0 - 17.0 g/dL   HCT 25.1 (L) 39.0 - 52.0 %   MCV 91.6 78.0 - 100.0 fL   MCH 28.8 26.0 - 34.0 pg   MCHC 31.5 30.0 - 36.0 g/dL   RDW 17.9 (H) 11.5 - 15.5 %   Platelets 359 150 - 400 K/uL  Prepare RBC     Status: None   Collection Time: 08/30/15  8:16 AM  Result Value Ref Range   Order Confirmation ORDER PROCESSED BY BLOOD BANK   Glucose, capillary     Status: Abnormal    Collection Time: 08/30/15  8:33 AM  Result Value Ref Range   Glucose-Capillary 115 (H) 65 - 99 mg/dL   Comment 1 Notify RN    Comment 2 Document in Chart   Prepare RBC     Status: None   Collection Time: 08/30/15 12:18 PM  Result Value Ref Range   Order Confirmation ORDER PROCESSED BY BLOOD BANK   Type and screen     Status: None   Collection Time: 08/30/15 12:18 PM  Result Value Ref Range   ABO/RH(D) B NEG    Antibody Screen NEG    Sample Expiration 09/02/2015    Unit Number [id number]    Blood Component Type RBC, LR IRR    Unit division 00    Status of Unit ISSUED,FINAL    Transfusion Status OK TO TRANSFUSE    Crossmatch Result Compatible    Unit Number [id number]    Blood Component Type RCLI PHER 1    Unit division 00    Status of Unit ISSUED,FINAL    Transfusion Status OK TO TRANSFUSE    Crossmatch Result Compatible   Glucose, capillary     Status: Abnormal   Collection Time: 08/30/15 12:20 PM  Result Value Ref Range   Glucose-Capillary 171 (H) 65 - 99 mg/dL   Comment 1 Notify RN    Comment 2 Document in Chart   Glucose, capillary     Status: None   Collection Time: 08/30/15  4:35 PM  Result Value Ref Range   Glucose-Capillary 99 65 - 99 mg/dL   Comment 1 Notify RN  Comment 2 Document in Chart   Glucose, capillary     Status: Abnormal   Collection Time: 08/30/15  8:33 PM  Result Value Ref Range   Glucose-Capillary 165 (H) 65 - 99 mg/dL  Vancomycin, trough     Status: None   Collection Time: 08/30/15  9:08 PM  Result Value Ref Range   Vancomycin Tr 17 10.0 - 20.0 ug/mL  Glucose, capillary     Status: Abnormal   Collection Time: 08/30/15 11:57 PM  Result Value Ref Range   Glucose-Capillary 106 (H) 65 - 99 mg/dL   Comment 1 Notify RN    Comment 2 Document in Chart   Glucose, capillary     Status: Abnormal   Collection Time: 08/31/15  4:02 AM  Result Value Ref Range   Glucose-Capillary 124 (H) 65 - 99 mg/dL  Basic metabolic panel     Status:  Abnormal   Collection Time: 08/31/15  5:35 AM  Result Value Ref Range   Sodium 147 (H) 135 - 145 mmol/L   Potassium 3.2 (L) 3.5 - 5.1 mmol/L   Chloride 107 101 - 111 mmol/L   CO2 31 22 - 32 mmol/L   Glucose, Bld 118 (H) 65 - 99 mg/dL   BUN 37 (H) 6 - 20 mg/dL   Creatinine, Ser 0.60 (L) 0.61 - 1.24 mg/dL   Calcium 8.7 (L) 8.9 - 10.3 mg/dL   GFR calc non Af Amer >60 >60 mL/min   GFR calc Af Amer >60 >60 mL/min    Comment: (NOTE) The eGFR has been calculated using the CKD EPI equation. This calculation has not been validated in all clinical situations. eGFR's persistently <60 mL/min signify possible Chronic Kidney Disease.    Anion gap 9 5 - 15  CBC     Status: Abnormal   Collection Time: 08/31/15  5:35 AM  Result Value Ref Range   WBC 22.8 (H) 4.0 - 10.5 K/uL   RBC 3.57 (L) 4.22 - 5.81 MIL/uL   Hemoglobin 10.4 (L) 13.0 - 17.0 g/dL    Comment: DELTA CHECK NOTED REPEATED TO VERIFY POST TRANSFUSION SPECIMEN    HCT 31.5 (L) 39.0 - 52.0 %   MCV 88.2 78.0 - 100.0 fL   MCH 29.1 26.0 - 34.0 pg   MCHC 33.0 30.0 - 36.0 g/dL   RDW 18.5 (H) 11.5 - 15.5 %   Platelets 358 150 - 400 K/uL  Glucose, capillary     Status: Abnormal   Collection Time: 08/31/15  7:49 AM  Result Value Ref Range   Glucose-Capillary 128 (H) 65 - 99 mg/dL   Comment 1 Notify RN    Comment 2 Document in Chart     Vitals: Blood pressure 129/69, pulse 76, temperature 99.9 F (37.7 C), temperature source Core (Comment), resp. rate 27, height '6\' 1"'  (1.854 m), weight 86.1 kg (189 lb 13.1 oz), SpO2 97 %.  Risk to Self: Is patient at risk for suicide?: No Risk to Others:   Prior Inpatient Therapy:   Prior Outpatient Therapy:    Current Facility-Administered Medications  Medication Dose Route Frequency Provider Last Rate Last Dose  . acetaminophen (TYLENOL) tablet 650 mg  650 mg Oral Q6H PRN Theressa Millard, MD   650 mg at 08/30/15 2017   Or  . acetaminophen (TYLENOL) suppository 650 mg  650 mg Rectal Q6H PRN  Theressa Millard, MD   650 mg at 08/21/15 1615  . albuterol (PROVENTIL) (2.5 MG/3ML) 0.083% nebulizer solution 2.5 mg  2.5 mg Nebulization Q4H PRN Rigoberto Noel, MD   2.5 mg at 08/30/15 0838  . cefTAZidime (FORTAZ) 2 g in dextrose 5 % 50 mL IVPB  2 g Intravenous 3 times per day Rigoberto Noel, MD   2 g at 08/31/15 0545  . dexamethasone (DECADRON) injection 2 mg  2 mg Intravenous Q24H Donita Brooks, NP      . diazepam (VALIUM) 1 MG/ML solution 5 mg  5 mg Per Tube Q8H PRN Kara Mead V, MD      . enoxaparin (LOVENOX) injection 40 mg  40 mg Subcutaneous Q24H Theressa Millard, MD   40 mg at 08/30/15 0946  . furosemide (LASIX) injection 40 mg  40 mg Intravenous Once Donita Brooks, NP      . insulin aspart (novoLOG) injection 0-9 Units  0-9 Units Subcutaneous 6 times per day Rigoberto Noel, MD   1 Units at 08/31/15 0848  . neomycin-bacitracin-polymyxin (NEOSPORIN) ointment 1 application  1 application Topical PRN Theressa Millard, MD      . ondansetron (ZOFRAN) injection 4 mg  4 mg Intravenous Q6H PRN Theressa Millard, MD      . pantoprazole sodium (PROTONIX) 40 mg/20 mL oral suspension 40 mg  40 mg Per Tube Daily Kara Mead V, MD   40 mg at 08/30/15 0947  . potassium chloride (K-DUR,KLOR-CON) CR tablet 30 mEq  30 mEq Oral Q4H Vishal Mungal, MD   0 mEq at 08/31/15 0800  . potassium chloride SA (K-DUR,KLOR-CON) CR tablet 20 mEq  20 mEq Oral Once Donita Brooks, NP      . RESOURCE THICKENUP CLEAR   Oral PRN Kara Mead V, MD      . sodium chloride 0.9 % injection 10 mL  10 mL Intracatheter PRN Brunetta Genera, MD      . sodium chloride 0.9 % injection 10-40 mL  10-40 mL Intracatheter Q12H Rigoberto Noel, MD   10 mL at 08/30/15 2120  . sodium chloride 0.9 % injection 10-40 mL  10-40 mL Intracatheter PRN Rigoberto Noel, MD      . vancomycin (VANCOCIN) IVPB 1000 mg/200 mL premix  1,000 mg Intravenous Q8H Rigoberto Noel, MD   1,000 mg at 08/31/15 6553    Musculoskeletal: Strength & Muscle Tone:  decreased Gait & Station: unable to stand Patient leans: N/A  Psychiatric Specialty Exam: Physical Exam  ROS Depression, generalized weakness, fatigue, restlessness, insomnia and mild paranoia No Fever-chills, No Headache, No changes with Vision or hearing, reports vertigo No problems swallowing food or Liquids, No Chest pain, Cough or Shortness of Breath, No Abdominal pain, No Nausea or Vommitting, Bowel movements are regular, No Blood in stool or Urine, No dysuria, No new skin rashes or bruises, No new joints pains-aches,  No new weakness, tingling, numbness in any extremity, No recent weight gain or loss, No polyuria, polydypsia or polyphagia,   A full 10 point Review of Systems was done, except as stated above, all other Review of Systems were negative.  Blood pressure 129/69, pulse 76, temperature 99.9 F (37.7 C), temperature source Core (Comment), resp. rate 27, height '6\' 1"'  (1.854 m), weight 86.1 kg (189 lb 13.1 oz), SpO2 97 %.Body mass index is 25.05 kg/(m^2).  General Appearance: Guarded  Eye Contact::  Good  Speech:  Clear and Coherent and Slow  Volume:  Normal  Mood:  Anxious and Depressed  Affect:  Constricted and Depressed  Thought Process:  Coherent and  Goal Directed  Orientation:  Full (Time, Place, and Person)  Thought Content:  Paranoid Ideation  Suicidal Thoughts:  No  Homicidal Thoughts:  No  Memory:  Immediate;   Fair Recent;   Fair  Judgement:  Impaired  Insight:  Lacking  Psychomotor Activity:  Decreased and Restlessness  Concentration:  Fair  Recall:  [employer] of Knowledge:Good  Language: Good  Akathisia:  Negative  Handed:  Right  AIMS (if indicated):     Assets:  Communication Skills Desire for Improvement Financial Resources/Insurance Housing Intimacy Leisure Time Resilience Social Support Talents/Skills Transportation  ADL's:  Impaired  Cognition: WNL  Sleep:      Medical Decision Making: New problem, with additional work up  planned, Review of Psycho-Social Stressors (1), Review or order clinical lab tests (1), Review of Last Therapy Session (1), Review or order medicine tests (1), Review of Medication Regimen & Side Effects (2) and Review of New Medication or Change in Dosage (2)  Treatment Plan Summary: Patient has depression, generalized weakness, restlessness, disturbed sleep and appetite and has severe medical problems and complications of treatment. Patient has mild paranoia and questionable delirium. Daily contact with patient to assess and evaluate symptoms and progress in treatment and Medication management  Plan:  Reviewed recent EKG which indicated QTC levels within normal limits Start Cymbalta 30 mg daily for depression and Abilify 2 mg twice daily for paranoia Patient does not meet criteria for psychiatric inpatient admission. Supportive therapy provided about ongoing stressors.  Appreciate psychiatric consultation and follow-up as clinically required Please contact [phone] or [phone] if needs further assistance  Disposition: Patient may benefit from outpatient psychiatric medication management when medically stable.   Takeesha Isley,JANARDHAHA R. 08/31/2015 9:26 AM

## 2015-08-31 NOTE — Progress Notes (Addendum)
MBS completed, full report to follow. Pt with moderate sensorimotor oropharyngeal dysphagia with trace aspiration of thin liquid below vocal cords = with subtle throat clearing.  Largest issue is weakness resulting in pharyngeal residuals *vallecular more than pyriform* across all consistencies without awareness/sensation.  Thicker viscosity resulted in increased residuals.  Dry nor liquid swallows cleared residuals and pt unable to perform "expectoration" [hock] to clear.  Pt will be an aspiration risk regardless of consistency due to residuals that he does not sense nor is able to clear.  But he may be less of a risk with liquids due to fewer residuals.    Using live monitor, educated pt to findings.  At end of study, patient stated he has no problems swallowing - which is different information than he provided earlier.      Encouraged pt to strengthen his "hocking" ability to help remove vallecular stasis.  Uncertain to level swallowing will improve given pt's medical diagnosis and prior patient report of hoarseness/speech/swallow deficits for 2 weeks prior to admit.    Options:   Full liquids with accepted risks  Multiple swallows with each bolus Intermittent throat clear (especially if vocal quality is wet) Expectoration throughout intake as able Stop intake if pt coughing  OR   NPO except ice chips and tsps water Consider repeat MBS with improved medical status  Luanna Salk, [place] [facility] SLP [phone]

## 2015-08-31 NOTE — Progress Notes (Signed)
Patient ID: Ethan Stewart, male   DOB: [birth year], 69 y.o.   MRN: [id number]         [facility]    Date of Admission:  08/20/2015           Day 11 of antibiotics        Day 3 vancomycin and ceftazidime  Principal Problem:   MDD (major depressive disorder), recurrent severe, without psychosis Active Problems:   DLBCL (diffuse large B cell lymphoma)   HCAP (healthcare-associated pneumonia)   Sepsis   Anemia associated with chemotherapy   Sinus tachycardia   GERD (gastroesophageal reflux disease)   Blood poisoning   Hypoxia   Acute on chronic respiratory failure with hypoxia   ARDS (adult respiratory distress syndrome)   Acute respiratory failure   Pressure ulcer   . cefTAZidime (FORTAZ)  IV  2 g Intravenous 3 times per day  . dexamethasone  2 mg Intravenous Q24H  . enoxaparin (LOVENOX) injection  40 mg Subcutaneous Q24H  . insulin aspart  0-9 Units Subcutaneous 6 times per day  . pantoprazole sodium  40 mg Per Tube Daily  . sodium chloride  10-40 mL Intracatheter Q12H  . vancomycin  1,000 mg Intravenous Q8H   Review of Systems: Review of systems not obtained due to patient factors.  Past Medical History  Diagnosis Date  . History of bleeding ulcers 1990's  . 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 Anxiety   . GERD (gastroesophageal reflux disease)   . H. pylori infection   . History of blood transfusion   . Bone cancer      tumor on C5  . Arm numbness 08/07/15    Limited movement due to tumor on spine  . Diffuse large B cell lymphoma     biposy 07/25/2105    Social History  Substance Use Topics  . Smoking status: Former Smoker -- 1.00 packs/day for 10 years    Types: Cigarettes    Quit date: 12/23/1981  . Smokeless tobacco: Never Used  . Alcohol Use: 3.0 oz/week    2 Glasses of wine, 3 Cans of beer per week    Family History  Problem Relation Age of Onset  . Hypertension Mother    No Known Allergies  OBJECTIVE: Filed Vitals:     08/31/15 0900 08/31/15 1000 08/31/15 1100 08/31/15 1200  BP:  136/73  132/56  Pulse:      Temp: 99.7 F (37.6 C) 99.7 F (37.6 C) 99.7 F (37.6 C) 100 F (37.8 C)  TempSrc:      Resp: 23 19 31 22   Height:      Weight:      SpO2: 98% 95% 98% 100%   Body mass index is 25.05 kg/(m^2).  General: He is sitting on the side of his bed working with physical therapy  Lab Results Lab Results  Component Value Date   WBC 22.8* 08/31/2015   HGB 10.4* 08/31/2015   HCT 31.5* 08/31/2015   MCV 88.2 08/31/2015   PLT 358 08/31/2015    Lab Results  Component Value Date   CREATININE 0.60* 08/31/2015   BUN 37* 08/31/2015   NA 147* 08/31/2015   K 3.2* 08/31/2015   CL 107 08/31/2015   CO2 31 08/31/2015    Lab Results  Component Value Date   ALT 30 08/27/2015   AST 53* 08/27/2015   ALKPHOS 98 08/27/2015   BILITOT 0.7 08/27/2015     Microbiology: Recent Results (from the  past 240 hour(s))  Culture, blood (routine x 2)     Status: None   Collection Time: 08/21/15  5:25 PM  Result Value Ref Range Status   Specimen Description BLOOD LEFT HAND  Final   Special Requests IN PEDIATRIC BOTTLE  4CC  Final   Culture   Final    NO GROWTH 5 DAYS Performed at [facility]    Report Status 08/26/2015 FINAL  Final  Respiratory virus panel     Status: None   Collection Time: 08/21/15  5:26 PM  Result Value Ref Range Status   Source - RVPAN NOSE  Corrected   Respiratory Syncytial Virus A Negative Negative Final   Respiratory Syncytial Virus B Negative Negative Final   Influenza A Negative Negative Final   Influenza B Negative Negative Final   Parainfluenza 1 Negative Negative Final   Parainfluenza 2 Negative Negative Final   Parainfluenza 3 Negative Negative Final   Metapneumovirus Negative Negative Final   Rhinovirus Negative Negative Final   Adenovirus Negative Negative Final    Comment: (NOTE) Performed At: [facility] [place], [place]  [id number] Lindon Romp MD [id number]   Culture, blood (routine x 2)     Status: None   Collection Time: 08/21/15  5:35 PM  Result Value Ref Range Status   Specimen Description BLOOD LEFT HAND  Final   Special Requests BOTTLES DRAWN AEROBIC AND ANAEROBIC  6CC  Final   Culture   Final    NO GROWTH 5 DAYS Performed at [facility]    Report Status 08/26/2015 FINAL  Final  Culture, bal-quantitative     Status: None   Collection Time: 08/22/15 12:48 PM  Result Value Ref Range Status   Specimen Description BRONCHIAL ALVEOLAR LAVAGE  Final   Special Requests Immunocompromised  Final   Gram Stain   Final    RARE WBC PRESENT,BOTH PMN AND MONONUCLEAR NO SQUAMOUS EPITHELIAL CELLS SEEN NO ORGANISMS SEEN Performed at [facility] Count NO GROWTH Performed at [facility]   Final   Culture   Final    NO GROWTH 2 Note: CORRECTED RESULTS CALLED TO: RUBY WL 9 16@1130  BY PARDA Performed at [facility]    Report Status 08/25/2015 FINAL  Final  Fungus Culture with Smear     Status: None (Preliminary result)   Collection Time: 08/22/15 12:48 PM  Result Value Ref Range Status   Specimen Description BRONCHIAL ALVEOLAR LAVAGE  Final   Special Requests Immunocompromised  Final   Fungal Smear   Final    NO YEAST OR FUNGAL ELEMENTS SEEN Performed at [facility]    Culture   Final    CULTURE IN PROGRESS FOR FOUR WEEKS Performed at [facility]    Report Status PENDING  Incomplete  Pneumocystis smear by DFA     Status: None   Collection Time: 08/22/15 12:49 PM  Result Value Ref Range Status   Specimen Source-PJSRC BRONCHIAL ALVEOLAR LAVAGE  Final   Pneumocystis jiroveci Ag NEGATIVE  Final    Comment: Performed at [facility]  Respiratory virus panel     Status: None   Collection Time: 08/23/15 11:47 PM  Result Value Ref Range Status   Respiratory Syncytial Virus A Negative Negative Final   Respiratory  Syncytial Virus B Negative Negative Final   Influenza A Negative Negative Final   Influenza B Negative Negative Final   Parainfluenza  1 Negative Negative Final   Parainfluenza 2 Negative Negative Final   Parainfluenza 3 Negative Negative Final   Metapneumovirus Negative Negative Final   Rhinovirus Negative Negative Final   Adenovirus Negative Negative Final    Comment: (NOTE) Performed At: [facility] [place], [place] [id number] Lindon Romp MD [id number]   Culture, respiratory (NON-Expectorated)     Status: None   Collection Time: 08/28/15 10:54 AM  Result Value Ref Range Status   Specimen Description TRACHEAL ASPIRATE  Final   Special Requests NONE  Final   Gram Stain   Final    FEW WBC PRESENT,BOTH PMN AND MONONUCLEAR RARE SQUAMOUS EPITHELIAL CELLS PRESENT YEAST Performed at [facility]    Culture   Final    FEW CANDIDA ALBICANS Performed at [facility]    Report Status 08/30/2015 FINAL  Final   ASSESSMENT: He continues to have some low-grade fevers but overall seems to be improving slowly. I discussed his case with Dr. Elsworth Soho. His initial bronchoscopy did not reveal much airway inflammation or secretions and there was initial consideration that his respiratory failure was noninfectious (perhaps related to capillary leak induced by G-CSF). Over the past weekend he had renewed fever and worsening infiltrates. Repeat bronchoscopy on 08/28/2015 revealed purulent secretions. His antibiotic therapy was changed to vancomycin and ceftazidime. Cultures grew Candida albicans which is an insignificant colonizer but he may have healthcare associated pneumonia superimposed upon his initial lung injury. I would plan on continuing current antibiotics for 4 more days.  PLAN: 1. Continue vancomycin and ceftazidime  Michel Bickers, MD [facility] for Infectious [place] [phone] pager   [phone] cell 08/31/2015, 1:52  PM

## 2015-09-01 ENCOUNTER — Inpatient Hospital Stay (HOSPITAL_COMMUNITY): Payer: Non-veteran care

## 2015-09-01 DIAGNOSIS — R1310 Dysphagia, unspecified: Secondary | ICD-10-CM | POA: Insufficient documentation

## 2015-09-01 DIAGNOSIS — L89159 Pressure ulcer of sacral region, unspecified stage: Secondary | ICD-10-CM

## 2015-09-01 DIAGNOSIS — J189 Pneumonia, unspecified organism: Secondary | ICD-10-CM

## 2015-09-01 DIAGNOSIS — E876 Hypokalemia: Secondary | ICD-10-CM | POA: Insufficient documentation

## 2015-09-01 DIAGNOSIS — R1314 Dysphagia, pharyngoesophageal phase: Secondary | ICD-10-CM

## 2015-09-01 DIAGNOSIS — B259 Cytomegaloviral disease, unspecified: Secondary | ICD-10-CM

## 2015-09-01 DIAGNOSIS — J9601 Acute respiratory failure with hypoxia: Secondary | ICD-10-CM

## 2015-09-01 DIAGNOSIS — Y95 Nosocomial condition: Secondary | ICD-10-CM

## 2015-09-01 LAB — CBC
HEMATOCRIT: 28.5 % — AB (ref 39.0–52.0)
HEMOGLOBIN: 9.4 g/dL — AB (ref 13.0–17.0)
MCH: 29.1 pg (ref 26.0–34.0)
MCHC: 33 g/dL (ref 30.0–36.0)
MCV: 88.2 fL (ref 78.0–100.0)
Platelets: 310 10*3/uL (ref 150–400)
RBC: 3.23 MIL/uL — AB (ref 4.22–5.81)
RDW: 18.2 % — ABNORMAL HIGH (ref 11.5–15.5)
WBC: 17.9 10*3/uL — AB (ref 4.0–10.5)

## 2015-09-01 LAB — COMPREHENSIVE METABOLIC PANEL
ALK PHOS: 110 U/L (ref 38–126)
ALT: 121 U/L — AB (ref 17–63)
AST: 75 U/L — AB (ref 15–41)
Albumin: 2.1 g/dL — ABNORMAL LOW (ref 3.5–5.0)
Anion gap: 8 (ref 5–15)
BILIRUBIN TOTAL: 0.8 mg/dL (ref 0.3–1.2)
BUN: 31 mg/dL — AB (ref 6–20)
CALCIUM: 8.5 mg/dL — AB (ref 8.9–10.3)
CHLORIDE: 107 mmol/L (ref 101–111)
CO2: 30 mmol/L (ref 22–32)
CREATININE: 0.57 mg/dL — AB (ref 0.61–1.24)
Glucose, Bld: 112 mg/dL — ABNORMAL HIGH (ref 65–99)
Potassium: 3 mmol/L — ABNORMAL LOW (ref 3.5–5.1)
Sodium: 145 mmol/L (ref 135–145)
TOTAL PROTEIN: 5.3 g/dL — AB (ref 6.5–8.1)

## 2015-09-01 LAB — GLUCOSE, CAPILLARY
Glucose-Capillary: 100 mg/dL — ABNORMAL HIGH (ref 65–99)
Glucose-Capillary: 105 mg/dL — ABNORMAL HIGH (ref 65–99)
Glucose-Capillary: 105 mg/dL — ABNORMAL HIGH (ref 65–99)
Glucose-Capillary: 106 mg/dL — ABNORMAL HIGH (ref 65–99)
Glucose-Capillary: 110 mg/dL — ABNORMAL HIGH (ref 65–99)
Glucose-Capillary: 132 mg/dL — ABNORMAL HIGH (ref 65–99)

## 2015-09-01 LAB — DIFFERENTIAL
BASOS ABS: 0 10*3/uL (ref 0.0–0.1)
Basophils Relative: 0 % (ref 0–1)
EOS ABS: 0.1 10*3/uL (ref 0.0–0.7)
Eosinophils Relative: 0 % (ref 0–5)
LYMPHS ABS: 1.3 10*3/uL (ref 0.7–4.0)
Lymphocytes Relative: 7 % — ABNORMAL LOW (ref 12–46)
MONO ABS: 0.6 10*3/uL (ref 0.1–1.0)
MONOS PCT: 3 % (ref 3–12)
Neutro Abs: 15.9 10*3/uL — ABNORMAL HIGH (ref 1.7–7.7)
Neutrophils Relative %: 90 % — ABNORMAL HIGH (ref 43–77)

## 2015-09-01 LAB — BASIC METABOLIC PANEL
ANION GAP: 7 (ref 5–15)
BUN: 34 mg/dL — AB (ref 6–20)
CALCIUM: 8.4 mg/dL — AB (ref 8.9–10.3)
CO2: 30 mmol/L (ref 22–32)
Chloride: 107 mmol/L (ref 101–111)
Creatinine, Ser: 0.6 mg/dL — ABNORMAL LOW (ref 0.61–1.24)
GFR calc Af Amer: >60 mL/min (ref >60)
GFR calc non Af Amer: >60 mL/min (ref >60)
GLUCOSE: 152 mg/dL — AB (ref 65–99)
Potassium: 2.9 mmol/L — ABNORMAL LOW (ref 3.5–5.1)
Sodium: 144 mmol/L (ref 135–145)

## 2015-09-01 LAB — MAGNESIUM: MAGNESIUM: 2.3 mg/dL (ref 1.7–2.4)

## 2015-09-01 LAB — TRIGLYCERIDES: Triglycerides: 171 mg/dL — ABNORMAL HIGH (ref <150)

## 2015-09-01 LAB — PREALBUMIN: PREALBUMIN: 27.7 mg/dL (ref 18–38)

## 2015-09-01 LAB — PHOSPHORUS: Phosphorus: 2.6 mg/dL (ref 2.5–4.6)

## 2015-09-01 MED ORDER — CHLORHEXIDINE GLUCONATE 0.12 % MT SOLN
15.0000 mL | Freq: Two times a day (BID) | OROMUCOSAL | Status: DC
  Administered 2015-09-01 – 2015-09-04 (×8): 15 mL via OROMUCOSAL
  Filled 2015-09-01 (×8): qty 15

## 2015-09-01 MED ORDER — TRACE MINERALS CR-CU-MN-SE-ZN 10-1000-500-60 MCG/ML IV SOLN
INTRAVENOUS | Status: DC
  Filled 2015-09-01: qty 1560

## 2015-09-01 MED ORDER — FAT EMULSION 20 % IV EMUL
240.0000 mL | INTRAVENOUS | Status: DC
  Filled 2015-09-01: qty 250

## 2015-09-01 MED ORDER — FREE WATER
100.0000 mL | Freq: Four times a day (QID) | Status: DC
Start: 2015-09-01 — End: 2015-09-04
  Administered 2015-09-01 – 2015-09-04 (×9): 100 mL

## 2015-09-01 MED ORDER — VITAL HIGH PROTEIN PO LIQD
1000.0000 mL | ORAL | Status: DC
  Filled 2015-09-01: qty 1000

## 2015-09-01 MED ORDER — PANTOPRAZOLE SODIUM 40 MG IV SOLR
40.0000 mg | INTRAVENOUS | Status: DC
  Administered 2015-09-01: 40 mg via INTRAVENOUS
  Filled 2015-09-01: qty 40

## 2015-09-01 MED ORDER — VITAL 1.5 CAL PO LIQD
1000.0000 mL | ORAL | Status: DC
  Administered 2015-09-01 – 2015-09-03 (×3): 1000 mL
  Filled 2015-09-01 (×5): qty 1000

## 2015-09-01 MED ORDER — POTASSIUM CHLORIDE 10 MEQ/50ML IV SOLN
10.0000 meq | INTRAVENOUS | Status: AC
  Administered 2015-09-01 (×6): 10 meq via INTRAVENOUS
  Filled 2015-09-01 (×7): qty 50

## 2015-09-01 MED ORDER — PRO-STAT SUGAR FREE PO LIQD
30.0000 mL | Freq: Two times a day (BID) | ORAL | Status: DC
Start: 2015-09-01 — End: 2015-09-05
  Administered 2015-09-03 – 2015-09-05 (×4): 30 mL via ORAL
  Filled 2015-09-01 (×5): qty 30

## 2015-09-01 MED ORDER — CETYLPYRIDINIUM CHLORIDE 0.05 % MT LIQD
7.0000 mL | Freq: Two times a day (BID) | OROMUCOSAL | Status: DC
  Administered 2015-09-01 – 2015-09-04 (×8): 7 mL via OROMUCOSAL

## 2015-09-01 MED ORDER — INSULIN ASPART 100 UNIT/ML ~~LOC~~ SOLN
0.0000 [IU] | SUBCUTANEOUS | Status: DC
  Administered 2015-09-01 – 2015-09-03 (×3): 1 [IU] via SUBCUTANEOUS

## 2015-09-01 NOTE — Progress Notes (Addendum)
PARENTERAL NUTRITION CONSULT NOTE - Follow-up  Pharmacy Consult for TPN Indication: NPO and severe protein-calorie malnutrition  No Known Allergies  Patient Measurements: Height: 6\' 1"  (185.4 cm) Weight: 189 lb 13.1 oz (86.1 kg) IBW/kg (Calculated) : 79.9  Vital Signs: Temp: 97.8 F (36.6 C) (09/09 0402) Temp Source: Oral (09/09 0402) BP: 136/62 mmHg (09/09 0600) Intake/Output from previous day: 09/08 0701 - 09/09 0700 In: 1371.2 [IV Piggyback:950; TPN:421.2] Out: 2075 [Urine:2075] Intake/Output from this shift:    Labs:  Recent Labs  08/30/15 0552 08/31/15 0535 09/01/15 0543  WBC 18.1* 22.8* 17.9*  HGB 7.9* 10.4* 9.4*  HCT 25.1* 31.5* 28.5*  PLT 359 358 310     Recent Labs  08/31/15 0535 08/31/15 2341 09/01/15 0543  NA 147* 144 145  K 3.2* 2.9* 3.0*  CL 107 107 107  CO2 31 30 30   GLUCOSE 118* 152* 112*  BUN 37* 34* 31*  CREATININE 0.60* 0.60* 0.57*  CALCIUM 8.7* 8.4* 8.5*  MG  --   --  2.3  PHOS  --   --  2.6  PROT  --   --  5.3*  ALBUMIN  --   --  2.1*  AST  --   --  75*  ALT  --   --  121*  ALKPHOS  --   --  110  BILITOT  --   --  0.8   Estimated Creatinine Clearance: 98.5 mL/min (by C-G formula based on Cr of 0.57).    Recent Labs  08/31/15 1927 08/31/15 2350 09/01/15 0400  GLUCAP 108* 132* 105*    Medical History: Past Medical History  Diagnosis Date  . History of bleeding ulcers 1990's  . 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 Anxiety   . GERD (gastroesophageal reflux disease)   . H. pylori infection   . History of blood transfusion   . Bone cancer      tumor on C5  . Arm numbness 08/07/15    Limited movement due to tumor on spine  . Diffuse large B cell lymphoma     biposy 07/25/2105    Medications:  Scheduled:  . antiseptic oral rinse  7 mL Mouth Rinse q12n4p  . ARIPiprazole  2 mg Oral BID  . cefTAZidime (FORTAZ)  IV  2 g Intravenous 3 times per day  . chlorhexidine  15 mL Mouth Rinse BID  . dexamethasone  2 mg  Intravenous Q24H  . DULoxetine  30 mg Oral Daily  . enoxaparin (LOVENOX) injection  40 mg Subcutaneous Q24H  . insulin aspart  0-15 Units Subcutaneous 6 times per day  . pantoprazole sodium  40 mg Per Tube Daily  . sodium chloride  10-40 mL Intracatheter Q12H  . vancomycin  1,000 mg Intravenous Q8H   Infusions:  . [name] KitchenTPN (CLINIMIX-E) Adult 40 mL/hr at 08/31/15 2134   And  . fat emulsion 240 mL (08/31/15 2137)    Insulin Requirements over 24h: 6 units SSI  Current Nutrition: NPO  IVF: none  Central access: PICC  TPN start date: 9/8  ASSESSMENT  HPI: 69yo M admitted with fever and SOB. Intubated for ARDs and pneumonia. Oncology (per notes) recommended enteral feeds over TPN but patient adamantly refused tube placement so TPN ordered.  Goal is to improve nutritional status for future chemotherapy.  Appears plan is to be discharged on TPN.  He received TF from 9/2-9/6 while intubated  Significant events:  9/6: extubated 9/8: swallow eval showed aspiration risk. Dr. Irene Limbo opted to start parenteral nutrition.   Today:   Glucose - On dexamethasone but controlled with SSI prior to TPN. Dexamethasone being weaned.   Electrolytes - K low (Mg WNL), Corr Ca = 10.02  Renal - SCr wnl.  LFTs - AST and ALT elevated on 9/9 labs  TGs - pending (9/9),  97 (9/5)  Prealbumin - pending (9/9)  NUTRITIONAL GOALS                                                                                             RD recs (from 9/6): 105-115gm protein and 1800-2000 Kcals Clinimix E 5/15 at a goal rate of 30ml/hr + 20% fat emulsion at 43ml/hr to provide: 108g/day protein, 2013 Kcal/day.  PLAN                                                                                                                          Increase Clinimix E 5/15 at 59ml/hr. This will be ~75% of goal and plan to increase to goal  (=71ml/hr) 9/10 as tolerates  20% fat emulsion at 11ml/hr.  Patient is step-down status so continue lipids  TPN to contain standard multivitamins and trace elements.  Continue SSI to moderate.   TPN lab panels on Mondays & Thursdays.  Give KCl 71meq/50mL x 6 runs  F/u daily.  Doreene Eland, PharmD, BCPS.   Pager: [phone]  09/01/2015,7:45 AM.  Addendum:   Spoke with pulmonary who states to stop TPN after current bag hanging and plans to start enteral feeding. Will d/c TPN labs and orders  Doreene Eland, PharmD, BCPS.   Pager: [phone] 09/01/2015 10:55 AM

## 2015-09-01 NOTE — Progress Notes (Signed)
PROGRESS NOTE    Ethan Stewart VOZ:[id number] DOB: 11/29/1946 DOA: 08/20/2015 PCP:  Melinda Crutch, MD  HPI/Brief narrative 69 y.o. M with diffuse large B cell lymphoma, brought to [facility] ED 8/29 with HCAP & ARDS. R-CHOP Chemo Rx last given on 08/09/2015. Status post C5 corpectomy on 8/3. Patient worsened and intubated on 8/30. Care was taken over by CCM and transferred back to [facility] on 09/01/15.   Assessment/Plan:  Healthcare associated pneumonia - Has been on multiple antibiotics since 8/29. Infectious disease is following. His initial bronchoscopy did not reveal much airway inflammation or secretions. However he subsequently worsened and repeat bronchoscopy 08/28/15 showed purulent secretions. - As per ID continue vancomycin and Fortaz for 4 days beginning 9/8 (DC after 9/11 doses)  Acute respiratory failure secondary to ARDS & HCAP - Resolved. ETT 8/30 > 9/6 - Patient was on Decadron for possible pneumonitis. Decadron discontinued  Barotrauma with subcutaneous emphysema 9/2 - Intermittent chest x-ray. Pulmonary toilet  Mild troponin leak - Suspect demand ischemia  Hypertension - Controlled  Hypernatremia  - Improved/resolved   Hypokalemia - Replace as needed and follow  GERD - PPI  Dysphagia - Failed swallow eval 9/8. Expect to improve. Meanwhile CCM placing Panda tube and starting tube feeds. Avoid TNA  Diffuse large B-cell lymphoma, s/p one cycle of R-CHOP - Oncology follow-up appreciated  Anemia - Stable. Follow CBCs  Anxiety - On Valium  Hx C5 tumor - s/p C5 corpectomy with C4-C6 arthrodesis and anterior cervical plating of C4-C6 (Dr. Ronnald Ramp - 07/26/15) - Continue cervical collar. Has follow-up appointment with Dr. Ronnald Ramp on 9/19 at which time collar is supposed to come off. - Has had right upper extremity monoparesis post surgery  Critical illness deconditioning/functional quadriplegia - PT and OT with plans for SNF for rehabilitation on  discharge.  Depression/paranoia - Psychiatry input appreciated. Patient started on Celexa and Abilify. Appreciated oncology concern regarding Abilify interacting with chemotherapy. Updated psychiatry and await further recommendations.  Delirium - Improving  Hyperglycemia - SSI   DVT prophylaxis: Lovenox  Code Status: Full  Family Communication: Discussed with spouse at bedside on 09/01/15 Disposition Plan: Transfer to telemetry on 9/9. DC to SNF when medically stable, possibly in the next 3-4 days   Consultants:  CCM-signed off 9/9   Infectious disease  Procedures:  ETT 8/30 > 9/6  LUE PICC line  Antibiotics: Zosyn, start date 8/29 >> 9/5 Azithro, start date 8/29 >> 9/2 Bactrim, start date 8/29 >> 8/31 Vanc 8/29 >> 9/1 Zosyn 8/28 >> 9/5 Vanco 9/5 >> Ceftazidime 9/5 >>  Subjective: Seen this morning. Patient asking for some cold water to drink but had failed swallow evaluation yesterday. No other complaints reported. As per nursing, no acute events.   Objective: Filed Vitals:   09/01/15 0200 09/01/15 0400 09/01/15 0402 09/01/15 0600  BP: 135/68 125/64  136/62  Pulse:      Temp:   97.8 F (36.6 C)   TempSrc:   Oral   Resp: 22 24  22   Height:      Weight:      SpO2: 94% 92%  95%    Intake/Output Summary (Last 24 hours) at 09/01/15 0718 Last data filed at 09/01/15 0600  Gross per 24 hour  Intake 1371.16 ml  Output   2075 ml  Net -703.84 ml   Filed Weights   08/29/15 0500 08/30/15 0641 08/31/15 0500  Weight: 88.6 kg (195 lb 5.2 oz) 89.2 kg (196 lb 10.4 oz) 86.1 kg (189  lb 13.1 oz)     Exam:  General exam: pleasant middle-aged male sitting up comfortably propped up in bed without distress. Has cervical collar in place.  Respiratory system: reduced breath sounds in the bases but otherwise clear to auscultation. No increased work of breathing. Telemetry: Sinus rhythm Cardiovascular system: S1 & S2 heard, RRR. No JVD, murmurs, gallops, clicks or pedal  edema. Gastrointestinal system: Abdomen is nondistended, soft and nontender. Normal bowel sounds heard. Central nervous system: Alert and oriented. No focal neurological deficits. Extremities: Symmetric 5 x 5 power except right upper extremity with grade 3 x 5 power.   Data Reviewed: Basic Metabolic Panel:  Recent Labs Lab 08/28/15 0416 08/29/15 0310 08/30/15 0552 08/31/15 0535 08/31/15 2341 09/01/15 0543  NA 147* 148* 146* 147* 144 145  K 4.2 4.4 3.7 3.2* 2.9* 3.0*  CL 103 106 107 107 107 107  CO2 37* 37* 32 31 30 30   GLUCOSE 145* 142* 157* 118* 152* 112*  BUN 46* 49* 45* 37* 34* 31*  CREATININE 0.67 0.57* 0.68 0.60* 0.60* 0.57*  CALCIUM 8.6* 8.4* 8.4* 8.7* 8.4* 8.5*  MG 2.6* 2.5*  --   --   --  2.3  PHOS 3.8 3.5  --   --   --  2.6   Liver Function Tests:  Recent Labs Lab 08/27/15 0451 09/01/15 0543  AST 53* 75*  ALT 30 121*  ALKPHOS 98 110  BILITOT 0.7 0.8  PROT 5.1* 5.3*  ALBUMIN 2.1* 2.1*   No results for input(s): LIPASE, AMYLASE in the last 168 hours. No results for input(s): AMMONIA in the last 168 hours. CBC:  Recent Labs Lab 08/27/15 0451 08/29/15 0310 08/30/15 0552 08/31/15 0535 09/01/15 0543  WBC 18.5* 18.1* 18.1* 22.8* 17.9*  NEUTROABS 16.7* 16.2*  --   --  15.9*  HGB 8.7* 8.7* 7.9* 10.4* 9.4*  HCT 27.0* 27.2* 25.1* 31.5* 28.5*  MCV 90.3 93.5 91.6 88.2 88.2  PLT 399 410* 359 358 310   Cardiac Enzymes: No results for input(s): CKTOTAL, CKMB, CKMBINDEX, TROPONINI in the last 168 hours. BNP (last 3 results) No results for input(s): PROBNP in the last 8760 hours. CBG:  Recent Labs Lab 08/31/15 1149 08/31/15 1556 08/31/15 1927 08/31/15 2350 09/01/15 0400  GLUCAP 162* 100* 108* 132* 105*    Recent Results (from the past 240 hour(s))  Culture, bal-quantitative     Status: None   Collection Time: 08/22/15 12:48 PM  Result Value Ref Range Status   Specimen Description BRONCHIAL ALVEOLAR LAVAGE  Final   Special Requests  Immunocompromised  Final   Gram Stain   Final    RARE WBC PRESENT,BOTH PMN AND MONONUCLEAR NO SQUAMOUS EPITHELIAL CELLS SEEN NO ORGANISMS SEEN Performed at [facility] Count NO GROWTH Performed at [facility]   Final   Culture   Final    NO GROWTH 2 Note: CORRECTED RESULTS CALLED TO: RUBY WL 9 16@1130  BY PARDA Performed at [facility]    Report Status 08/25/2015 FINAL  Final  Fungus Culture with Smear     Status: None (Preliminary result)   Collection Time: 08/22/15 12:48 PM  Result Value Ref Range Status   Specimen Description BRONCHIAL ALVEOLAR LAVAGE  Final   Special Requests Immunocompromised  Final   Fungal Smear   Final    NO YEAST OR FUNGAL ELEMENTS SEEN Performed at [facility]    Culture   Final    CULTURE IN PROGRESS FOR  FOUR WEEKS Performed at [facility]    Report Status PENDING  Incomplete  Pneumocystis smear by DFA     Status: None   Collection Time: 08/22/15 12:49 PM  Result Value Ref Range Status   Specimen Source-PJSRC BRONCHIAL ALVEOLAR LAVAGE  Final   Pneumocystis jiroveci Ag NEGATIVE  Final    Comment: Performed at [facility]  Respiratory virus panel     Status: None   Collection Time: 08/23/15 11:47 PM  Result Value Ref Range Status   Respiratory Syncytial Virus A Negative Negative Final   Respiratory Syncytial Virus B Negative Negative Final   Influenza A Negative Negative Final   Influenza B Negative Negative Final   Parainfluenza 1 Negative Negative Final   Parainfluenza 2 Negative Negative Final   Parainfluenza 3 Negative Negative Final   Metapneumovirus Negative Negative Final   Rhinovirus Negative Negative Final   Adenovirus Negative Negative Final    Comment: (NOTE) Performed At: [facility] [place], [place] [id number] Lindon Romp MD NO:[id number]   Culture, respiratory (NON-Expectorated)     Status: None   Collection Time: 08/28/15  10:54 AM  Result Value Ref Range Status   Specimen Description TRACHEAL ASPIRATE  Final   Special Requests NONE  Final   Gram Stain   Final    FEW WBC PRESENT,BOTH PMN AND MONONUCLEAR RARE SQUAMOUS EPITHELIAL CELLS PRESENT YEAST Performed at [facility]    Culture   Final    FEW CANDIDA ALBICANS Performed at [facility]    Report Status 08/30/2015 FINAL  Final           Studies: Dg Chest Port 1 View  09/01/2015   CLINICAL DATA:  ARDS.  EXAM: PORTABLE CHEST - 1 VIEW  COMPARISON:  08/30/2015.  FINDINGS: Left PICC line in stable position . Cardiomegaly. Low lung volumes with stable bibasilar atelectasis and/or infiltrates. No pleural effusion or pneumothorax. Right supraclavicular subcutaneous emphysema is stable.  IMPRESSION: 1. Left PICC line stable position. 2. Low lung volumes with stable bibasilar subsegmental atelectasis and/or infiltrates. 3. Stable cardiomegaly. 4. Right supraclavicular subcutaneous emphysema unchanged. No pneumothorax.   Electronically Signed   By: Marcello Moores  Register   On: 09/01/2015 07:02   Dg Swallowing Func-speech Pathology  08/31/2015    Objective Swallowing Evaluation:    Patient Details  Name: Ethan Stewart MRN: [id number] Date of Birth: 10-08-1946  Today's Date: 08/31/2015 Time: SLP Start Time (ACUTE ONLY): 1402-SLP Stop Time (ACUTE ONLY): 1426 SLP Time Calculation (min) (ACUTE ONLY): 24 min  Past Medical History:  Past Medical History  Diagnosis Date  . History of bleeding ulcers 1990's  . 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 Anxiety   . GERD (gastroesophageal reflux disease)   . H. pylori infection   . History of blood transfusion   . Bone cancer      tumor on C5  . Arm numbness 08/07/15    Limited movement due to tumor on spine  . Diffuse large B cell lymphoma     biposy 07/25/2105   Past Surgical History:  Past Surgical History  Procedure Laterality Date  . Hernia repair Bilateral     with mesh  . Colonoscopy    . Eye surgery Right     catheter  .  Anterior cervical corpectomy N/A 07/26/2015    Procedure: Cervical five Corpectomy/Cervical four-six Fusion/Plate ;   Surgeon: Eustace Moore, MD;  Location: [facility];  Service:  Neurosurgery;  Laterality: N/A;  C5 Corpectomy/C4-6 Fusion/Plate C4-6   HPI:  Other Pertinent Information: 69 y.o. male with a history of Diffuse Large  B-Cell Lymphoma diagnosed 07/2015 on R-CHOP Chemo Rx, GERD, bone cancer,  cervical spine tumor, bleeding ulcers admitted with complaints of fevers  and chills and cough with SOB x 2 days. Pt underwent s/p C5 corpectomy  with C4-C6 arthrodesis and anterior cervical plating of C4-C6 8/13.  Intubated 8/30-9/6. CXR 9/5 progressive opacification and volume loss in  the right lower lobe concerning for increasing atelectasis versus  developing infiltrate and repeat CXR 9/7 there is subcutaneous air,  primarily on the right, but no pneumothorax appreciable. Patchy airspace  disease in the bases is stable. No new opacity. No change in cardiac  silhouette. RN reported coughing after ice chips and BSE obtained  yesterday with pt being placed on modified diet.  Today pt seen to inform  his/wife of this SLP recommendation for MBS due to mulitple risk factors  for silent nature of dysphagia.    No Data Recorded  Assessment / Plan / Recommendation CHL IP CLINICAL IMPRESSIONS 08/31/2015  Therapy Diagnosis Moderate oral phase dysphagia;Moderate pharyngeal phase  dysphagia  Clinical Impression Moderate sensorimotor oropharyngeal dysphagia Pt with  moderate sensorimotor oropharyngeal dysphagia with trace aspiration of  thin liquid below vocal cords = with subtle throat clearing. Largest  issue is weakness resulting in pharyngeal residuals *vallecular more than  pyriform* across all consistencies without awareness/sensation. Thicker  viscosity resulted in increased residuals. Dry nor liquid swallows  cleared residuals and pt unable to perform "expectoration" hock to clear.  Pt will be an aspiration risk  regardless of consistency due to residuals  that he does not sense nor is able to clear. But he may be less of a risk  with liquids due to fewer residuals.Using live monitor, educated pt to  findings.   At end of study, patient stated he has no problems swallowing - which is  different information than he provided earlier.Encouraged pt to  strengthen his "hocking" ability to help remove vallecular stasis.  Uncertain to level swallowing will improve given pt's medical diagnosis  and prior patient report of hoarseness/speech/swallow deficits for 2 weeks  prior to admit. Options: Full liquids with accepted risks   Multiple  swallows with each bolus.  Intermittent throat clear (especially if vocal  quality is wet)  Expectoration throughout intake as able.  Stop intake if  pt coughing  OR NPO except ice chips and tsps water.  Consider repeat MBS  with improved medical status.         CHL IP TREATMENT RECOMMENDATION 08/31/2015  Treatment Recommendations Therapy as outlined in treatment plan below     CHL IP DIET RECOMMENDATION 08/31/2015  SLP Diet Recommendations NPO;Ice chips PRN after oral care or full liquid  with accepted aspiration risk  Liquid Administration via Cup, straw  Medication Administration Crushed with puree  Compensations Slow rate;Small sips/bites;Multiple dry swallows after each  bite/sip;Clear throat intermittently, cough if voice wet/gurgly  Postural Changes and/or Swallow Maneuvers Stay upright after meals     CHL IP OTHER RECOMMENDATIONS 08/31/2015  Recommended Consults (None)  Oral Care Recommendations Oral care QID  Other Recommendations (None)     No flowsheet data found.   CHL IP FREQUENCY AND DURATION 08/31/2015  Speech Therapy Frequency (ACUTE ONLY) min 1 x/week  Treatment Duration 1 week         CHL IP REASON FOR REFERRAL 08/31/2015  Reason for Referral  Objectively evaluate swallowing function     CHL IP ORAL PHASE 08/31/2015  Lips (None)  Tongue (None)  Mucous membranes (None)  Nutritional  status (None)  Other (None)  Oxygen therapy (None)  Oral Phase Impaired  Oral - Pudding Teaspoon (None)  Oral - Pudding Cup (None)  Oral - Honey Teaspoon (None)  Oral - Honey Cup (None)  Oral - Honey Syringe (None)  Oral - Nectar Teaspoon (None)  Oral - Nectar Cup (None)  Oral - Nectar Straw (None)  Oral - Nectar Syringe (None)  Oral - Ice Chips (None)  Oral - Thin Teaspoon (None) Oral - Thin Cup (None)  Oral - Thin Straw (None)  Oral - Thin Syringe (None)  Oral - Puree (None)  Oral - Mechanical Soft (None)  Oral - Regular (None)  Oral - Multi-consistency (None)  Oral - Pill (None)  Oral Phase - Comment (None)      CHL IP PHARYNGEAL PHASE 08/31/2015  Pharyngeal Phase Impaired  Pharyngeal - Pudding Teaspoon (None)  Penetration/Aspiration details (pudding teaspoon) (None)  Pharyngeal - Pudding Cup (None)  Penetration/Aspiration details (pudding cup) (None)  Pharyngeal - Honey Teaspoon (None)  Penetration/Aspiration details (honey teaspoon) (None)  Pharyngeal - Honey Cup (None)  Penetration/Aspiration details (honey cup) (None)  Pharyngeal - Honey Syringe (None)  Penetration/Aspiration details (honey syringe) (None)  Pharyngeal - Nectar Teaspoon (None)  Penetration/Aspiration details (nectar teaspoon) (None)  Pharyngeal - Nectar Cup (None)  Penetration/Aspiration details (nectar cup) (None)  Pharyngeal - Nectar Straw (None)  Penetration/Aspiration details (nectar straw) (None)  Pharyngeal - Nectar Syringe (None)  Penetration/Aspiration details (nectar syringe) (None)  Pharyngeal - Ice Chips (None)  Penetration/Aspiration details (ice chips) (None)  Pharyngeal - Thin Teaspoon (None)  Penetration/Aspiration details (thin teaspoon) (None)  Pharyngeal - Thin Cup (None)  Penetration/Aspiration details (thin cup) (None)  Pharyngeal - Thin Straw (None)  Penetration/Aspiration details (thin straw) (None)  Pharyngeal - Thin Syringe (None)  Penetration/Aspiration details (thin syringe') (None)  Pharyngeal - Puree (None)   Penetration/Aspiration details (puree) (None)  Pharyngeal - Mechanical Soft (None)  Penetration/Aspiration details (mechanical soft) (None)  Pharyngeal - Regular (None)  Penetration/Aspiration details (regular) (None)  Pharyngeal - Multi-consistency (None)  Penetration/Aspiration details (multi-consistency) (None)  Pharyngeal - Pill (None)  Penetration/Aspiration details (pill) (None)  Pharyngeal Comment pt WITHOUT sensation to residuals nor did he clear them  with CUED dry swallows due to gross weakness, pt did not clear residuals  with cued "hocking" either although he made a good effort      CHL IP CERVICAL ESOPHAGEAL PHASE 08/31/2015  Cervical Esophageal Phase Impaired  Pudding Teaspoon (None)  Pudding Cup (None)  Honey Teaspoon (None)  Honey Cup (None)  Honey Straw (None)  Nectar Teaspoon (None)  Nectar Cup (None)  Nectar Straw (None)  Nectar Sippy Cup (None)  Thin Teaspoon (None)  Thin Cup (None)  Thin Straw (None)  Thin Sippy Cup (None)  Cervical Esophageal Comment decreased clearance into upper  esophagus-pyriform sinus residuals             Luanna Salk, MS [facility] SLP [phone]         Scheduled Meds: . antiseptic oral rinse  7 mL Mouth Rinse q12n4p  . ARIPiprazole  2 mg Oral BID  . cefTAZidime (FORTAZ)  IV  2 g Intravenous 3 times per day  . chlorhexidine  15 mL Mouth Rinse BID  . dexamethasone  2 mg Intravenous Q24H  . DULoxetine  30 mg Oral Daily  . enoxaparin (LOVENOX) injection  40 mg Subcutaneous Q24H  . insulin aspart  0-15 Units Subcutaneous 6 times per day  . pantoprazole sodium  40 mg Per Tube Daily  . sodium chloride  10-40 mL Intracatheter Q12H  . vancomycin  1,000 mg Intravenous Q8H   Continuous Infusions: . [name] KitchenTPN (CLINIMIX-E) Adult 40 mL/hr at 08/31/15 2134   And  . fat emulsion 240 mL (08/31/15 2137)    Principal Problem:   MDD (major depressive disorder), recurrent severe, without psychosis Active Problems:   DLBCL (diffuse large B cell lymphoma)   HCAP  (healthcare-associated pneumonia)   Sepsis   Anemia associated with chemotherapy   Sinus tachycardia   GERD (gastroesophageal reflux disease)   Blood poisoning   Hypoxia   Acute on chronic respiratory failure with hypoxia   ARDS (adult respiratory distress syndrome)   Acute respiratory failure   Pressure ulcer   Dysphagia, pharyngoesophageal phase   Protein-calorie malnutrition, severe    Time spent: 40 minutes.    Vernell Leep, MD, FACP, FHM. [facility] Pager [phone]  If 7PM-7AM, please contact night-coverage [email or web address] Password [email or web address] 09/01/2015, 7:18 AM    LOS: 11 days

## 2015-09-01 NOTE — Progress Notes (Signed)
Clinical Social Work  Psych MD and CSW rounded on patient together. Patient being transferred to alternative room but wife reports patient is a "better frame of mind today". Wife reports patient is participating with therapy and agreeable to SNF at DC. Psych CSW will follow for support but unit CSW to assist with SNF placement.  [place], [place] [phone]

## 2015-09-01 NOTE — Progress Notes (Signed)
Speech Language Pathology Treatment: Dysphagia  Patient Details Name: Ethan Stewart MRN: [id number] DOB: 1946-04-06 Today's Date: 09/01/2015 Time: 1115-1140 SLP Time Calculation (min) (ACUTE ONLY): 25 min  Assessment / Plan / Recommendation Clinical Impression  SLP arrived to patient per spouse request x2.  Explained results of MBS and clinical reasoning for current plan using diagram of anatomy of the swallow  - especially given spouse was not present during yesterday's MBS evaluation.   Reinforced exercises for pt to complete this weekend to maximize swallow rehabilitation including consumption of ice chips - double swallow, "hock" and re-swallow or expectorate.  Also advised pt to clear his throat and re-swallow if voice is wet or gurgly due to laryngeal penetration.    Pt demonstrated exercise to this SLP and wife/patient verbalized exercises.  Pt repeated "I have to pass this" several times during session.  Wrote instructions on paper for patient and wife, wife expressed gratitude.     Pt does appear with mildly improved phonation today compared to yesterday.  Will plan repeat MBS Monday- as pt does not sense pharyngeal residuals - All in agreement to plan.   HPI Other Pertinent Information: 69 y.o. male with a history of Diffuse Large B-Cell Lymphoma diagnosed 07/2015 on R-CHOP Chemo Rx, GERD, bone cancer, cervical spine tumor, bleeding ulcers admitted with complaints of fevers and chills and cough with SOB x 2 days. Pt underwent s/p C5 corpectomy with C4-C6 arthrodesis and anterior cervical plating of C4-C6 8/13. Intubated 8/30-9/6. CXR 9/5 progressive opacification and volume loss in the right lower lobe concerning for increasing atelectasis versus developing infiltrate and repeat CXR 9/7 there is subcutaneous air, primarily on the right, but no pneumothorax appreciable. Patchy airspace disease in the bases is stable. No new opacity. No change in cardiac silhouette. RN reported coughing  after ice chips and BSE obtained yesterday with pt being placed on modified diet.  Today pt seen to inform his/wife of this SLP recommendation for MBS due to mulitple risk factors for silent nature of dysphagia.     Pertinent Vitals Pain Assessment: No/denies pain  SLP Plan  Continue with current plan of care    Recommendations Diet recommendations: NPO (ice chips) Liquids provided via: Teaspoon Medication Administration: Via alternative means Compensations:  (multiple swallows with each ice chip swallow, hock and reswallow, clear throat if voice wet and re-swallow) Postural Changes and/or Swallow Maneuvers: Seated upright 90 degrees;Upright 30-60 min after meal              Oral Care Recommendations: Oral care QID Follow up Recommendations: Other (comment) (tbd) Plan: Continue with current plan of care    [place], [place] [facility] SLP [phone]

## 2015-09-01 NOTE — Progress Notes (Signed)
[name] Kitchen   HEMATOLOGY/ONCOLOGY INPATIENT PROGRESS NOTE   Date of Service: 09/01/2015  Inpatient Attending: .Modena Jansky, MD   SUBJECTIVE  [name] was seen this afternoon. He is increasingly more alert and awake and talkative. Received TPN overnight. This morning was agreeable to placement of NJ tube and is currently getting tube feeding. He is now off oxygen and is being transferred out of the ICU. He is very happy about having his Foley's catheter removed yesterday and has been able to manage with the urinal in bed. Wife and brother-in-law at bedside. He is looking forward to going to the rehabilitation place for further therapies. No other acute symptoms. No fevers today.    OBJECTIVE:  PHYSICAL EXAMINATION: . Filed Vitals:   09/01/15 0800 09/01/15 1000 09/01/15 1200 09/01/15 1400  BP: 117/61 137/74 120/56 116/62  Pulse:    83  Temp: 97.3 F (36.3 C)   98.3 F (36.8 C)  TempSrc: Oral   Oral  Resp: '25 24 21 20  ' Height:    '6\' 1"'  (1.854 m)  Weight:    194 lb 8 oz (88.225 kg)  SpO2: 93% 95% 92% 93%   GENERAL: awake, breathing comfortably. SKIN: no acute rashes EYES: open, tracking movement making good eye contact today. OROPHARYNX: no exudates NECK: no JVD, C- collar on for C-spine protection. LYMPH:  no palpable lymphadenopathy in the cervical, axillary LUNGS:CTA b/l HEART: regular rate & rhythm,  no murmurs and bilateral lower extremity 1+ edema. ABDOMEN: abdomen soft, non-tender, normoactive bowel sounds  NEURO: alert , oriented x 3, baseline RUE weakness overall appears. PSYCH: More positive today. No suicidal or homicidal ideation. No overt delusions. Delirium resolving   ALLERGIES:  has No Known Allergies.  MEDICATIONS:  Scheduled Meds: . antiseptic oral rinse  7 mL Mouth Rinse q12n4p  . cefTAZidime (FORTAZ)  IV  2 g Intravenous 3 times per day  . chlorhexidine  15 mL Mouth Rinse BID  . DULoxetine  30 mg Oral Daily  . enoxaparin (LOVENOX) injection  40 mg  Subcutaneous Q24H  . feeding supplement (PRO-STAT SUGAR FREE 64)  30 mL Oral BID  . free water  100 mL Per Tube 4 times per day  . insulin aspart  0-9 Units Subcutaneous 6 times per day  . pantoprazole (PROTONIX) IV  40 mg Intravenous Q24H  . potassium chloride  10 mEq Intravenous Q1 Hr x 6  . sodium chloride  10-40 mL Intracatheter Q12H  . vancomycin  1,000 mg Intravenous Q8H   Continuous Infusions: . [name] KitchenTPN (CLINIMIX-E) Adult Stopped (09/01/15 1300)   And  . fat emulsion 240 mL (08/31/15 2137)  . feeding supplement (VITAL 1.5 CAL) 1,000 mL (09/01/15 1316)   PRN Meds:.acetaminophen **OR** acetaminophen, albuterol, diazepam, neomycin-bacitracin-polymyxin, [DISCONTINUED] ondansetron **OR** ondansetron (ZOFRAN) IV, RESOURCE THICKENUP CLEAR, sodium chloride, sodium chloride  REVIEW OF SYSTEMS:    10 Point review of Systems was done is negative except as noted above.   LABORATORY DATA: . CBC  . CBC Latest Ref Rng 09/01/2015 08/31/2015 08/30/2015  WBC 4.0 - 10.5 K/uL 17.9(H) 22.8(H) 18.1(H)  Hemoglobin 13.0 - 17.0 g/dL 9.4(L) 10.4(L) 7.9(L)  Hematocrit 39.0 - 52.0 % 28.5(L) 31.5(L) 25.1(L)  Platelets 150 - 400 K/uL 310 358 359    . CMP Latest Ref Rng 09/01/2015 08/31/2015 08/31/2015  Glucose 65 - 99 mg/dL 112(H) 152(H) 118(H)  BUN 6 - 20 mg/dL 31(H) 34(H) 37(H)  Creatinine 0.61 - 1.24 mg/dL 0.57(L) 0.60(L) 0.60(L)  Sodium 135 - 145 mmol/L  145 144 147(H)  Potassium 3.5 - 5.1 mmol/L 3.0(L) 2.9(L) 3.2(L)  Chloride 101 - 111 mmol/L 107 107 107  CO2 22 - 32 mmol/L '30 30 31  ' Calcium 8.9 - 10.3 mg/dL 8.5(L) 8.4(L) 8.7(L)  Total Protein 6.5 - 8.1 g/dL 5.3(L) - -  Total Bilirubin 0.3 - 1.2 mg/dL 0.8 - -  Alkaline Phos 38 - 126 U/L 110 - -  AST 15 - 41 U/L 75(H) - -  ALT 17 - 63 U/L 121(H) - -     RADIOGRAPHIC STUDIES:ASSESSMENT & PLAN:   #1  Diffuse large B-cell lymphoma stage IV AE with involvement of mesenteric, inguinal lymph nodes and C5 vertebral body with spinal cord impingement.  He  had a C5 corpectomy on 07/26/2015 that showed diffuse large B-cell lymphoma with a Ki-67 ranging from 20 to 90%.    DLBCL (diffuse large B cell lymphoma)   06/21/2015 Imaging CT Chest/abd/pelvis: IMPRESSION: 5 mm nodule within the right lower lobe as described.  Pneumobilia consistent with a prior sphincterotomy.  No acute abnormality is identified. No findings to suggest chest etiology for the known lesion at C5 are seen.   06/21/2015 Imaging MRI c spine1. Diffuse marrow replacement of the C5 vertebral body highly concerning for neoplasm (metastatic disease, myeloma, or lymphoma). Epidural tumor at this level results in moderate spinal stenosis with moderate impression on the spinal cord    07/10/2015 PET scan 1. The previously demonstrated abnormal C5 vertebral body is hypermetabolic. No other osseous lesions demonstrated. 2. There are small hypermetabolic lymph nodes within the left mesentery and both inguinal regions.    07/26/2015 Initial Biopsy Had a C5 corpectomy pathology showed diffuse large B-cell lymphoma   08/08/2015 -  Chemotherapy Started for cycle of R CHOP.    08/09/2015 -  Chemotherapy Received first cycle of intrathecal methotrexate plus hydrocortisone for CNS prophylaxis.   He is currently s/p 1 st cycle of R-CHOP on 08/08/2015 and was scheduled for 2nd cycle of R-CHOP on 08/31/2015.   We might need to dose adjust his chemotherapy once he is better to reduce the risk of neutropenic complications (though he really did not have prolonged neutropenia) since be might be hard pressed to not use any G-CSF. Plan Aggressive input by therapies Please try to get patient to recliner with the help of lift if needed. -wound care for sacral decubitus ulcer _TCU on discharge. Active therapies. -We will plan to following him up in clinic in 5-7 days post discharge.  -Considering that he is being treated with curative intent would try to avoid >1week delay in next cycle of chemotherapy if possible.  (considering R-mini CHOP and will likely hold IT methotrexate prophylaxis this cycle)  #2 ARDS with fevers and bilateral groundglass pulmonary opacities. Noncardiogenic pulmonary edema normal BNP. Mild troponin leak due to demand ischemia which normalized.   Neulasta related ARDS versus HCAP.  Lungs much improved patient has been extubated and is currently only using 2 L of oxygen. Still some low-grade fevers. ID following.  Blood cultures negative thus far Viral respiratory panel negative Bronchoalveolar lavage with no overt organisms seen on Gram stain. PCP negative. CMV IgG positive IgM negative PCR qualitative positive.  Elevated sedimentation rate and CRP now showing improvement which is a good sign.  Urine testing negative for Legionella antigen and Streptococcus pneumoniae. Repeat BAL showed Candida which is unlikely incidental organism.  #3 hypoxic respiratory failure due to #2 much improved now also went and with decreasing oxygen requirements.  #4  Anemia related to chemotherapy and lymphoma as well as possible sepsis. #5  leukocytosis likely due to Neulasta and steroids and less likely due to ongoing sepsis . #6 ICU delirium - meds/intubation/steroids/infection -improved today #7 protein calorie malnutrition #8 significant muscle wasting due to immobilization, steroids, nutritional limitations,, neoplasm. #9 symptomatically anemia due to chemotherapy plus multiple blood draws plus poor bone marrow reserve. #10 hypokalemia #11 sacral decubitus ulcer Plan -on ceftazidime plus vancomycin in the setting of low-grade fevers per ID (3 more days) -Off dexamethasone -We will have to avoid using any G-CSF in the future. -Received TPN overnight was agreeable to getting an NJ tube and tube feeding this afternoon which is much better option. -only irradiated blood products for transfusion. -will continue to follow up daily. #10 Delirium -resolving rapidly #11 h/o  MDD Plan -re-orienting activities. -active engagement in goal oriented therapies as much as possible would be most therapeutic. -off steroids and minimize other sedatives as much as possible. -maintain sleep/wakeful cycles. -Mx per psych - seen by psych and started on Cymbalta and Abilify. Would recommend psych team be mindful of possible pharmacologic drug interactions with his planned chemotherapy. Abilify especially has significant drug interactions with several medications metabolized through the cyp 3A4 and cyp 2D6 pathways which make it a difficult medication to manage with chemotherapy.  Will continue to follow. Appreciate excellent critical care/ pulmonary care and ID input. Provided update to his wife and brother in law at bedside.   The total time spent  was 35 minutes and more than 50% was on counseling and direct patient cares.    Sullivan Lone MD Desha AAHIVMS [facility] Hematology/Oncology Physician [place]  (Office):       [phone] (Work cell):  [phone] (Fax):           [phone]

## 2015-09-01 NOTE — Progress Notes (Signed)
CSW met with pt / spouse to assist with d/c planning. PN reviewed. PT has recommended SNF placement for rehab at d/c. Pt / spouse are in agreement with this plan and have requested [facility] ( [facility] ). SNF search has been initiated and bed offers are pending. CSW will continue to follow to assist with d/c planning.    Werner Lean LCSW [phone]

## 2015-09-01 NOTE — Progress Notes (Signed)
Date:  Sept.9, 2016 U.R. performed for needs and level of care. Will continue to follow for Case Management needs.  Velva Harman, RN, BSN, [place]   [phone]

## 2015-09-01 NOTE — Progress Notes (Signed)
Pt arrived to unit from ICU on ICU bed, slid to floor bed w/ 4 assist. Pt A&Ox4, denies pain/discomfort at present. C collar in place. Assessment as charted. VS stable. Pt and wife oriented to callbell and environment. POC discussed. Pt assisted to bedpan.

## 2015-09-01 NOTE — Progress Notes (Signed)
Patient ID: Ethan Stewart, male   DOB: 1946/08/15, 69 y.o.   MRN: [id number]         [facility]    Date of Admission:  08/20/2015           Day 12 of antibiotics        Day 5 vancomycin and ceftazidime  Principal Problem:   MDD (major depressive disorder), recurrent severe, without psychosis Active Problems:   DLBCL (diffuse large B cell lymphoma)   HCAP (healthcare-associated pneumonia)   Sepsis   Anemia associated with chemotherapy   Sinus tachycardia   GERD (gastroesophageal reflux disease)   Blood poisoning   Hypoxia   Acute on chronic respiratory failure with hypoxia   ARDS (adult respiratory distress syndrome)   Acute respiratory failure   Pressure ulcer   Dysphagia, pharyngoesophageal phase   Protein-calorie malnutrition, severe   . antiseptic oral rinse  7 mL Mouth Rinse q12n4p  . ARIPiprazole  2 mg Oral BID  . cefTAZidime (FORTAZ)  IV  2 g Intravenous 3 times per day  . chlorhexidine  15 mL Mouth Rinse BID  . DULoxetine  30 mg Oral Daily  . enoxaparin (LOVENOX) injection  40 mg Subcutaneous Q24H  . feeding supplement (VITAL HIGH PROTEIN)  1,000 mL Per Tube Q24H  . insulin aspart  0-9 Units Subcutaneous 6 times per day  . pantoprazole (PROTONIX) IV  40 mg Intravenous Q24H  . potassium chloride  10 mEq Intravenous Q1 Hr x 6  . sodium chloride  10-40 mL Intracatheter Q12H  . vancomycin  1,000 mg Intravenous Q8H   SUBJECTIVE:  He is feeling better but desperately wants something cold to drink. He states that he is hopeful to be discharged next week to an outpatient rehabilitation facility.  Review of Systems: Review of systems not obtained due to patient factors.  Past Medical History  Diagnosis Date  . History of bleeding ulcers 1990's  . 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 Anxiety   . GERD (gastroesophageal reflux disease)   . H. pylori infection   . History of blood transfusion   . Bone cancer      tumor on C5  . Arm  numbness 08/07/15    Limited movement due to tumor on spine  . Diffuse large B cell lymphoma     biposy 07/25/2105    Social History  Substance Use Topics  . Smoking status: Former Smoker -- 1.00 packs/day for 10 years    Types: Cigarettes    Quit date: 12/23/1981  . Smokeless tobacco: Never Used  . Alcohol Use: 3.0 oz/week    2 Glasses of wine, 3 Cans of beer per week    Family History  Problem Relation Age of Onset  . Hypertension Mother    No Known Allergies  OBJECTIVE: Filed Vitals:   09/01/15 0400 09/01/15 0402 09/01/15 0600 09/01/15 0800  BP: 125/64  136/62   Pulse:      Temp:  97.8 F (36.6 C)  97.3 F (36.3 C)  TempSrc:  Oral  Oral  Resp: 24  22   Height:      Weight:      SpO2: 92%  95%    Body mass index is 25.05 kg/(m^2).  General: He is sitting up in bed visiting with his wife Lungs: Clear Cor: Regular S1 and S2 with no murmurs He is much more alert and talkative  Lab Results Lab Results  Component Value Date  WBC 17.9* 09/01/2015   HGB 9.4* 09/01/2015   HCT 28.5* 09/01/2015   MCV 88.2 09/01/2015   PLT 310 09/01/2015    Lab Results  Component Value Date   CREATININE 0.57* 09/01/2015   BUN 31* 09/01/2015   NA 145 09/01/2015   K 3.0* 09/01/2015   CL 107 09/01/2015   CO2 30 09/01/2015    Lab Results  Component Value Date   ALT 121* 09/01/2015   AST 75* 09/01/2015   ALKPHOS 110 09/01/2015   BILITOT 0.8 09/01/2015     Microbiology: Recent Results (from the past 240 hour(s))  Culture, bal-quantitative     Status: None   Collection Time: 08/22/15 12:48 PM  Result Value Ref Range Status   Specimen Description BRONCHIAL ALVEOLAR LAVAGE  Final   Special Requests Immunocompromised  Final   Gram Stain   Final    RARE WBC PRESENT,BOTH PMN AND MONONUCLEAR NO SQUAMOUS EPITHELIAL CELLS SEEN NO ORGANISMS SEEN Performed at [facility] NO GROWTH Performed at [facility]   Final   Culture   Final    NO  GROWTH 2 Note: CORRECTED RESULTS CALLED TO: RUBY WL 9 16@1130  BY PARDA Performed at [facility]    Report Status 08/25/2015 FINAL  Final  Fungus Culture with Smear     Status: None (Preliminary result)   Collection Time: 08/22/15 12:48 PM  Result Value Ref Range Status   Specimen Description BRONCHIAL ALVEOLAR LAVAGE  Final   Special Requests Immunocompromised  Final   Fungal Smear   Final    NO YEAST OR FUNGAL ELEMENTS SEEN Performed at [facility]    Culture   Final    CULTURE IN PROGRESS FOR FOUR WEEKS Performed at [facility]    Report Status PENDING  Incomplete  Pneumocystis smear by DFA     Status: None   Collection Time: 08/22/15 12:49 PM  Result Value Ref Range Status   Specimen Source-PJSRC BRONCHIAL ALVEOLAR LAVAGE  Final   Pneumocystis jiroveci Ag NEGATIVE  Final    Comment: Performed at [place] [facility]  Respiratory virus panel     Status: None   Collection Time: 08/23/15 11:47 PM  Result Value Ref Range Status   Respiratory Syncytial Virus A Negative Negative Final   Respiratory Syncytial Virus B Negative Negative Final   Influenza A Negative Negative Final   Influenza B Negative Negative Final   Parainfluenza 1 Negative Negative Final   Parainfluenza 2 Negative Negative Final   Parainfluenza 3 Negative Negative Final   Metapneumovirus Negative Negative Final   Rhinovirus Negative Negative Final   Adenovirus Negative Negative Final    Comment: (NOTE) Performed At: [facility] [place], [place] [id number] Lindon Romp MD NL:[id number]   Culture, respiratory (NON-Expectorated)     Status: None   Collection Time: 08/28/15 10:54 AM  Result Value Ref Range Status   Specimen Description TRACHEAL ASPIRATE  Final   Special Requests NONE  Final   Gram Stain   Final    FEW WBC PRESENT,BOTH PMN AND MONONUCLEAR RARE SQUAMOUS EPITHELIAL CELLS PRESENT YEAST Performed at [facility]     Culture   Final    FEW CANDIDA ALBICANS Performed at [facility]    Report Status 08/30/2015 FINAL  Final   Serum CMV PCR: Positive  ASSESSMENT: He has defervesced seen and improving clinically on therapy for HCAP. His CMV PCR was  positive but I strongly suspect this is asymptomatic shedding that does not need therapy.  PLAN: 1. Continue vancomycin and ceftazidime for 3 more days 2. Please call Dr. Lita Mains [phone]) for any infectious disease questions this weekend  Michel Bickers, MD [facility] [phone] pager   [phone] cell 09/01/2015, 11:25 AM

## 2015-09-01 NOTE — Progress Notes (Signed)
NUTRITION NOTE  New consult for RD to manage TF. Pt seen for full follow-up this AM.   RD will order, per recommendations in this AM's note, Vital 1.5 @ 60 mL/hr with 30 mL Prostat once/day and 100 mL free water Q6h. This regimen will provide 2260 kcal, 112 grams protein, and 1500 mL free water.  RD will continue to follow per protocol.   Jarome Matin, RD, LDN Inpatient Clinical Dietitian Pager # [phone] After hours/weekend pager # [phone]

## 2015-09-01 NOTE — Progress Notes (Addendum)
Inpatient Rehabilitation  Rehab Admissions coordinator was asked to review pt's case for possible appropriateness for an IP Rehab admission.  I note that pt. is insured by the [place].  [employer] does not cover for IP Rehab admission, and pt. would essentially be "self pay" if he came to IP Rehab.  It is most prudent for pt. to go to SNF for rehab unless pt. would like to come as a self pay patient.  At this time we are not recommending IP Rehab consult.  Please call if questions.  [place] Admissions Coordinator Cell [phone] Office [phone]   09/01/15 1250 ADDENDUM:   I  received a call from Werner Lean, SW that pt. will be using [employer] insurance for his post acute rehab.  I took a closer look at PT/OT notes and still believe best plan at present is for SNF level therapies, based on prior therapy recommendations and his current level of function.  I do not believe he can currently tolerate rigors of 3 hours of therapy. If he begins to tolerate more with his therapies, it may become appropriate to consider an IP Rehab consult.  I have updated Roselyn Reef of these recommendations.    Gerlean Ren

## 2015-09-01 NOTE — Progress Notes (Signed)
PULMONARY / CRITICAL CARE MEDICINE   Name: Ethan Stewart MRN: [id number] DOB: 1946-12-20    ADMISSION DATE:  08/20/2015 CONSULTATION DATE:  09/01/2015  REFERRING MD : Ethan Stewart  CHIEF COMPLAINT: SOB  INITIAL PRESENTATION: 69 y.o. M with diffuse large B cell lymphoma, brought to [facility] ED 8/29 with HCAP & ARDS.  R-CHOP Chemo Rx last given on 08/09/2015.  Status post C5 corpectomy on 8/3.  STUDIES:  CXR 8/29 >>> worsened central lung alveolar opacities bilaterally, suspicious for infectious infiltrates. CTA chest 8/29 >>> b/l airspace disease c/w atypical PNA or non-cardiogenic edema. No PE. CXR 9/2 >> sub cut emphysema RT neck  SIGNIFICANT EVENTS: 8/29  Admitted 8/30  Intubated, ARDS protocol 9/03  Off pressors, diuresis neg 2.2 liters 9/07  Febrile, no pain, wants to go home, good UOP, 2U PRBC by onc 9/8 Failed swallow  -TNA started by Onc    SUBJECTIVE:  defervesced More upbeat Good UO   VITAL SIGNS: Temp:  [97.3 F (36.3 C)-100.4 F (38 C)] 97.3 F (36.3 C) (09/09 0800) Resp:  [17-31] 22 (09/09 0600) BP: (115-149)/(55-70) 136/62 mmHg (09/09 0600) SpO2:  [90 %-100 %] 95 % (09/09 0600)             INTAKE / OUTPUT: Intake/Output      09/08 0701 - 09/09 0700 09/09 0701 - 09/10 0700   I.V. (mL/kg)     Blood     IV Piggyback 950    TPN 421.2    Total Intake(mL/kg) 1371.2 (15.9)    Urine (mL/kg/hr) 2075 (1)    Total Output 2075     Net -703.8            PHYSICAL EXAMINATION: General: Adult male in NAD, C- collar in place Neuro: awake, interactive, generalized weakness HEENT: PERRL  Cardiovascular: RRR, no MRG Lungs: even/non-labored, lungs bilaterally with good air movement, mildly coarse Abdomen: Soft, non-tender, non-distended, no r/g Musculoskeletal: No acute deformity, C-Collar in place Skin: Grossly intact  LABS:  CBC  Recent Labs Lab 08/30/15 0552 08/31/15 0535 09/01/15 0543  WBC 18.1* 22.8* 17.9*  HGB 7.9* 10.4* 9.4*  HCT 25.1* 31.5*  28.5*  PLT 359 358 310   Coag's No results for input(s): APTT, INR in the last 168 hours.   BMET  Recent Labs Lab 08/31/15 0535 08/31/15 2341 09/01/15 0543  NA 147* 144 145  K 3.2* 2.9* 3.0*  CL 107 107 107  CO2 31 30 30   BUN 37* 34* 31*  CREATININE 0.60* 0.60* 0.57*  GLUCOSE 118* 152* 112*   Electrolytes  Recent Labs Lab 08/28/15 0416 08/29/15 0310  08/31/15 0535 08/31/15 2341 09/01/15 0543  CALCIUM 8.6* 8.4*  < > 8.7* 8.4* 8.5*  MG 2.6* 2.5*  --   --   --  2.3  PHOS 3.8 3.5  --   --   --  2.6  < > = values in this interval not displayed.   Sepsis Markers No results for input(s): LATICACIDVEN, PROCALCITON, O2SATVEN in the last 168 hours.   ABG  Recent Labs Lab 08/28/15 1606 08/29/15 1000  PHART 7.469* 7.482*  PCO2ART 48.9* 46.5*  PO2ART 86.6 62.9*   Liver Enzymes  Recent Labs Lab 08/27/15 0451 09/01/15 0543  AST 53* 75*  ALT 30 121*  ALKPHOS 98 110  BILITOT 0.7 0.8  ALBUMIN 2.1* 2.1*   Cardiac Enzymes No results for input(s): TROPONINI, PROBNP in the last 168 hours.   Glucose  Recent Labs Lab 08/31/15 1149 08/31/15 1556  08/31/15 1927 08/31/15 2350 09/01/15 0400 09/01/15 0747  GLUCAP 162* 100* 108* 132* 105* 110*    Imaging Dg Chest Port 1 View  09/01/2015   CLINICAL DATA:  ARDS.  EXAM: PORTABLE CHEST - 1 VIEW  COMPARISON:  08/30/2015.  FINDINGS: Left PICC line in stable position . Cardiomegaly. Low lung volumes with stable bibasilar atelectasis and/or infiltrates. No pleural effusion or pneumothorax. Right supraclavicular subcutaneous emphysema is stable.  IMPRESSION: 1. Left PICC line stable position. 2. Low lung volumes with stable bibasilar subsegmental atelectasis and/or infiltrates. 3. Stable cardiomegaly. 4. Right supraclavicular subcutaneous emphysema unchanged. No pneumothorax.   Electronically Signed   By: Marcello Moores  Register   On: 09/01/2015 07:02   Dg Swallowing Func-speech Pathology  08/31/2015    Objective Swallowing  Evaluation:    Patient Details  Name: Ethan Stewart MRN: [id number] Date of Birth: Feb 20, 1946  Today's Date: 08/31/2015 Time: SLP Start Time (ACUTE ONLY): 1402-SLP Stop Time (ACUTE ONLY): 1426 SLP Time Calculation (min) (ACUTE ONLY): 24 min  Past Medical History:  Past Medical History  Diagnosis Date  . History of bleeding ulcers 1990's  . 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 Anxiety   . GERD (gastroesophageal reflux disease)   . H. pylori infection   . History of blood transfusion   . Bone cancer      tumor on C5  . Arm numbness 08/07/15    Limited movement due to tumor on spine  . Diffuse large B cell lymphoma     biposy 07/25/2105   Past Surgical History:  Past Surgical History  Procedure Laterality Date  . Hernia repair Bilateral     with mesh  . Colonoscopy    . Eye surgery Right     catheter  . Anterior cervical corpectomy N/A 07/26/2015    Procedure: Cervical five Corpectomy/Cervical four-six Fusion/Plate ;   Surgeon: Eustace Moore, MD;  Location: [facility];  Service:  Neurosurgery;  Laterality: N/A;  C5 Corpectomy/C4-6 Fusion/Plate C4-6   HPI:  Other Pertinent Information: 69 y.o. male with a history of Diffuse Large  B-Cell Lymphoma diagnosed 07/2015 on R-CHOP Chemo Rx, GERD, bone cancer,  cervical spine tumor, bleeding ulcers admitted with complaints of fevers  and chills and cough with SOB x 2 days. Pt underwent s/p C5 corpectomy  with C4-C6 arthrodesis and anterior cervical plating of C4-C6 8/13.  Intubated 8/30-9/6. CXR 9/5 progressive opacification and volume loss in  the right lower lobe concerning for increasing atelectasis versus  developing infiltrate and repeat CXR 9/7 there is subcutaneous air,  primarily on the right, but no pneumothorax appreciable. Patchy airspace  disease in the bases is stable. No new opacity. No change in cardiac  silhouette. RN reported coughing after ice chips and BSE obtained  yesterday with pt being placed on modified diet.  Today pt seen to inform  his/wife of this SLP  recommendation for MBS due to mulitple risk factors  for silent nature of dysphagia.    No Data Recorded  Assessment / Plan / Recommendation CHL IP CLINICAL IMPRESSIONS 08/31/2015  Therapy Diagnosis Moderate oral phase dysphagia;Moderate pharyngeal phase  dysphagia  Clinical Impression Moderate sensorimotor oropharyngeal dysphagia Pt with  moderate sensorimotor oropharyngeal dysphagia with trace aspiration of  thin liquid below vocal cords = with subtle throat clearing. Largest  issue is weakness resulting in pharyngeal residuals *vallecular more than  pyriform* across all consistencies without awareness/sensation. Thicker  viscosity resulted in increased residuals. Dry nor liquid swallows  cleared residuals  and pt unable to perform "expectoration" hock to clear.  Pt will be an aspiration risk regardless of consistency due to residuals  that he does not sense nor is able to clear. But he may be less of a risk  with liquids due to fewer residuals.Using live monitor, educated pt to  findings.   At end of study, patient stated he has no problems swallowing - which is  different information than he provided earlier.Encouraged pt to  strengthen his "hocking" ability to help remove vallecular stasis.  Uncertain to level swallowing will improve given pt's medical diagnosis  and prior patient report of hoarseness/speech/swallow deficits for 2 weeks  prior to admit. Options: Full liquids with accepted risks   Multiple  swallows with each bolus.  Intermittent throat clear (especially if vocal  quality is wet)  Expectoration throughout intake as able.  Stop intake if  pt coughing  OR NPO except ice chips and tsps water.  Consider repeat MBS  with improved medical status.         CHL IP TREATMENT RECOMMENDATION 08/31/2015  Treatment Recommendations Therapy as outlined in treatment plan below     CHL IP DIET RECOMMENDATION 08/31/2015  SLP Diet Recommendations NPO;Ice chips PRN after oral care or full liquid  with accepted  aspiration risk  Liquid Administration via Cup, straw  Medication Administration Crushed with puree  Compensations Slow rate;Small sips/bites;Multiple dry swallows after each  bite/sip;Clear throat intermittently, cough if voice wet/gurgly  Postural Changes and/or Swallow Maneuvers Stay upright after meals     CHL IP OTHER RECOMMENDATIONS 08/31/2015  Recommended Consults (None)  Oral Care Recommendations Oral care QID  Other Recommendations (None)     No flowsheet data found.   CHL IP FREQUENCY AND DURATION 08/31/2015  Speech Therapy Frequency (ACUTE ONLY) min 1 x/week  Treatment Duration 1 week         CHL IP REASON FOR REFERRAL 08/31/2015  Reason for Referral Objectively evaluate swallowing function     CHL IP ORAL PHASE 08/31/2015  Lips (None)  Tongue (None)  Mucous membranes (None)  Nutritional status (None)  Other (None)  Oxygen therapy (None)  Oral Phase Impaired  Oral - Pudding Teaspoon (None)  Oral - Pudding Cup (None)  Oral - Honey Teaspoon (None)  Oral - Honey Cup (None)  Oral - Honey Syringe (None)  Oral - Nectar Teaspoon (None)  Oral - Nectar Cup (None)  Oral - Nectar Straw (None)  Oral - Nectar Syringe (None)  Oral - Ice Chips (None)  Oral - Thin Teaspoon (None) Oral - Thin Cup (None)  Oral - Thin Straw (None)  Oral - Thin Syringe (None)  Oral - Puree (None)  Oral - Mechanical Soft (None)  Oral - Regular (None)  Oral - Multi-consistency (None)  Oral - Pill (None)  Oral Phase - Comment (None)      CHL IP PHARYNGEAL PHASE 08/31/2015  Pharyngeal Phase Impaired  Pharyngeal - Pudding Teaspoon (None)  Penetration/Aspiration details (pudding teaspoon) (None)  Pharyngeal - Pudding Cup (None)  Penetration/Aspiration details (pudding cup) (None)  Pharyngeal - Honey Teaspoon (None)  Penetration/Aspiration details (honey teaspoon) (None)  Pharyngeal - Honey Cup (None)  Penetration/Aspiration details (honey cup) (None)  Pharyngeal - Honey Syringe (None)  Penetration/Aspiration details (honey syringe) (None)  Pharyngeal -  Nectar Teaspoon (None)  Penetration/Aspiration details (nectar teaspoon) (None)  Pharyngeal - Nectar Cup (None)  Penetration/Aspiration details (nectar cup) (None)  Pharyngeal - Nectar Straw (None)  Penetration/Aspiration details (nectar straw) (None)  Pharyngeal -  Nectar Syringe (None)  Penetration/Aspiration details (nectar syringe) (None)  Pharyngeal - Ice Chips (None)  Penetration/Aspiration details (ice chips) (None)  Pharyngeal - Thin Teaspoon (None)  Penetration/Aspiration details (thin teaspoon) (None)  Pharyngeal - Thin Cup (None)  Penetration/Aspiration details (thin cup) (None)  Pharyngeal - Thin Straw (None)  Penetration/Aspiration details (thin straw) (None)  Pharyngeal - Thin Syringe (None)  Penetration/Aspiration details (thin syringe') (None)  Pharyngeal - Puree (None)  Penetration/Aspiration details (puree) (None)  Pharyngeal - Mechanical Soft (None)  Penetration/Aspiration details (mechanical soft) (None)  Pharyngeal - Regular (None)  Penetration/Aspiration details (regular) (None)  Pharyngeal - Multi-consistency (None)  Penetration/Aspiration details (multi-consistency) (None)  Pharyngeal - Pill (None)  Penetration/Aspiration details (pill) (None)  Pharyngeal Comment pt WITHOUT sensation to residuals nor did he clear them  with CUED dry swallows due to gross weakness, pt did not clear residuals  with cued "hocking" either although he made a good effort      CHL IP CERVICAL ESOPHAGEAL PHASE 08/31/2015  Cervical Esophageal Phase Impaired  Pudding Teaspoon (None)  Pudding Cup (None)  Honey Teaspoon (None)  Honey Cup (None)  Honey Straw (None)  Nectar Teaspoon (None)  Nectar Cup (None)  Nectar Straw (None)  Nectar Sippy Cup (None)  Thin Teaspoon (None)  Thin Cup (None)  Thin Straw (None)  Thin Sippy Cup (None)  Cervical Esophageal Comment decreased clearance into upper  esophagus-pyriform sinus residuals             Luanna Salk, MS [facility] SLP [phone]      ASSESSMENT / PLAN:  PULMONARY ETT 8/30  >>9/6 A:  ARDS - resolved HCAP - cx neg. DD incl G-CSF related pneumonitis Barotrauma - sub cut emphysema 9/2  P:  Intermittent CXR  Push pulmonary hygiene dc decadron  (? Pneumonitis)  CARDIOVASCULAR A:  Mild troponin leak - suspect demand ischemia. Hypotension - due to PEEP/sedatives HTN - 9/5 off sedation P:  Tele monitoring  Lasix daily  RENAL A:  Hypernatremia Hypokalemia  P:  Monitor BMP / UOP  Replace electrolytes as indicated   GASTROINTESTINAL A:  GERD. Protein calorie malnutrition. Loose stools - resolved.   P:  Pantoprazole Rpt Swallow eval on Monday  -expect to improve>> mantime place NG panda & start TFs, AVOID TNA unless cannot use gut   HEMATOLOGIC / ONCOLOGIC A:  Diffuse large B cell lymphoma (follows with Dr. Vivien Rossetti) s/p 1 cycle of R-CHOP Anemia. VTE Prophylaxis. P:  SCD's / Lovenox  INFECTIOUS A:  HCAP / ? Atypical PNA Fever - ? Malignancy related as all cultures have been negative  P:  BCx2 8/29 >>neg UCx 8/29 >> neg Sputum Cx 8/29 >> neg PCP stain 8/29 >> neg U. Legionella 8/29 >> neg U. Strep 8/29 >> neg RVP 8/29 nasal swab >> neg RVP 8/30 BAL >> neg Tracheal Aspirate 9/5 >> few candida albicans  Zosyn, start date 8/29 >> Azithro, start date 8/29 >> 9/2 Bactrim, start date 8/29 >> 8/31 Vanc 8/29 >> 9/1 Zosyn 8/28 >> 9/5 Vanco 9/5 >> Ceftazidime 9/5 >>  ID following, appreciate input -can dc vanc in my opinion  ENDOCRINE A:  At risk for steroid induced hyperglycemia.  P:  SSI - sensitive   NEUROLOGIC A:  Anxiety - on valium Hx C5 tumor - s/p C5 corpectomy with C4-C6 arthrodesis and anterior cervical plating of C4-C6 (Dr. Ronnald Ramp - 07/26/15) Critical Illness Deconditioning  Depression  P: PT following - SNF plan OT consult PSY consulted  for depression / paranoia  Code Status: Full.  Family updated: pt & Wife updated at bedside  Encouraged the patient and his wife to discuss what  he wants for plan of care - if he would want to go back on a ventilator, CPR, goals for chemotherapy etc.     Interdisciplinary Family Meeting v Palliative Care Meeting:done     GLOBAL:  ARDS -resolved, severe deconditioning with dysphagia, recent C spine surgery delerium improving - would do better on floor bed, dc TNA & use enteral feeds  PCCM to sign off  Kara Mead MD. Shade Flood. [facility] Pager [phone] If no response call [phone]     09/01/2015, 10:33 AM

## 2015-09-01 NOTE — Progress Notes (Addendum)
Nutrition Follow-up  DOCUMENTATION CODES:   Not applicable  INTERVENTION:  - Recommend Vital 1.5 @ 60 mL/hr with 30 mL Prostat once/day which will provide 2260 kcal, 112 grams protein, and 1100 mL free water. - RD will continue to monitor for needs  NUTRITION DIAGNOSIS:   Increased nutrient needs related to catabolic illness, cancer and cancer related treatments as evidenced by estimated needs. -ongoing   GOAL:   Patient will meet greater than or equal to 90% of their needs -unmet currently  MONITOR:   Diet advancement, Labs, Weight trends, Skin, I & O's  ASSESSMENT:   69 y.o. male with a history of Diffuse Large B-Cell Lymphoma diagnosed 07/2015 on R-CHOP Chemo Rx last given on 08/09/2015 who presents to the ED with complaints of fevers and chills and cough with SOB x 2 days. He was evaluated in the ED and on chest X-ray was found to have pneumonia and an elevated lactic Acid level . A Sepsis/HCAP workup was initiated and he was placed on IV Vancomycin and Zosyn and referred for admission.   Pt extubated 9/6. Diet advanced 9/7 per SLP recommendation: Dysphagia 1 with nectar-thick liquids. No intakes documented. Pt now NPO and TPN was initiated yesterday PM.  Pt currently receiving Clinimix E 5/15 @ 40 mL/hr with 20% lipids @ 10 mL/hr and plan, per pharmacy to advance to Clinimix E 5/15 @ 65 mL/hr with 20% lipids @ 10 mL/hr tonight. This new rate will provide 78 grams protein and 1588 kcal. Pharmacy note indicates goal of Clinimix E 5/15 @ 110 mL/hr with 20% lipids @ 10 mL/hr which will provide 132 grams protein and 2354 kcal. Kcal needs updated due to cancer, catabolic state.  TPN indication listed as: NPO, severe protin-calorie malnutrition. This is not an appropriate indication for TPN. Pt previously on TF and tolerating without issue.   Talked with CCM who reports if pt unable to swallow safely (previously having aspiration with intakes) then NGT to be placed with TF. RD in  agreement with this plan and TF recommendations outlined above.  Not currently meeting needs. Medications reviewed. Labs reviewed; CBGs: 100-162 mg/dL, K: mmol/L, BUN/creatinine elevated but trending down, Ca: 8.5 mg/dL.   Diet Order:  Diet NPO time specified Except for: Ice Chips, Sips with Meds TPN (CLINIMIX-E) Adult TPN (CLINIMIX-E) Adult  Skin:  Reviewed, no issues  Last BM:  9/8  Height:   Ht Readings from Last 1 Encounters:  08/23/15 6\' 1"  (1.854 m)    Weight:   Wt Readings from Last 1 Encounters:  08/31/15 189 lb 13.1 oz (86.1 kg)    Ideal Body Weight:  83.64 kg (kg)  BMI:  Body mass index is 25.05 kg/(m^2).  Estimated Nutritional Needs:   Kcal:  2150-2400  Protein:  105-115 grams  Fluid:  2L/day  EDUCATION NEEDS:   No education needs identified at this time     Jarome Matin, RD, LDN Inpatient Clinical Dietitian Pager # [phone] After hours/weekend pager # [phone]

## 2015-09-02 ENCOUNTER — Other Ambulatory Visit: Payer: Self-pay | Admitting: Hematology

## 2015-09-02 ENCOUNTER — Inpatient Hospital Stay (HOSPITAL_COMMUNITY): Payer: Non-veteran care

## 2015-09-02 DIAGNOSIS — R7989 Other specified abnormal findings of blood chemistry: Secondary | ICD-10-CM

## 2015-09-02 DIAGNOSIS — C8330 Diffuse large B-cell lymphoma, unspecified site: Secondary | ICD-10-CM

## 2015-09-02 DIAGNOSIS — R740 Nonspecific elevation of levels of transaminase and lactic acid dehydrogenase [LDH]: Secondary | ICD-10-CM

## 2015-09-02 DIAGNOSIS — E876 Hypokalemia: Secondary | ICD-10-CM

## 2015-09-02 DIAGNOSIS — R748 Abnormal levels of other serum enzymes: Secondary | ICD-10-CM

## 2015-09-02 DIAGNOSIS — R945 Abnormal results of liver function studies: Secondary | ICD-10-CM | POA: Insufficient documentation

## 2015-09-02 LAB — COMPREHENSIVE METABOLIC PANEL
ALT: 265 U/L — AB (ref 17–63)
AST: 252 U/L — AB (ref 15–41)
Albumin: 2.2 g/dL — ABNORMAL LOW (ref 3.5–5.0)
Alkaline Phosphatase: 176 U/L — ABNORMAL HIGH (ref 38–126)
Anion gap: 7 (ref 5–15)
BUN: 25 mg/dL — AB (ref 6–20)
CHLORIDE: 109 mmol/L (ref 101–111)
CO2: 28 mmol/L (ref 22–32)
CREATININE: 0.54 mg/dL — AB (ref 0.61–1.24)
Calcium: 8.5 mg/dL — ABNORMAL LOW (ref 8.9–10.3)
Glucose, Bld: 98 mg/dL (ref 65–99)
POTASSIUM: 3.3 mmol/L — AB (ref 3.5–5.1)
SODIUM: 144 mmol/L (ref 135–145)
Total Bilirubin: 0.9 mg/dL (ref 0.3–1.2)
Total Protein: 5.3 g/dL — ABNORMAL LOW (ref 6.5–8.1)

## 2015-09-02 LAB — VANCOMYCIN, TROUGH: VANCOMYCIN TR: 22 ug/mL — AB (ref 10.0–20.0)

## 2015-09-02 LAB — CBC
HEMATOCRIT: 27.8 % — AB (ref 39.0–52.0)
HEMOGLOBIN: 9.1 g/dL — AB (ref 13.0–17.0)
MCH: 29.1 pg (ref 26.0–34.0)
MCHC: 32.7 g/dL (ref 30.0–36.0)
MCV: 88.8 fL (ref 78.0–100.0)
Platelets: 327 10*3/uL (ref 150–400)
RBC: 3.13 MIL/uL — ABNORMAL LOW (ref 4.22–5.81)
RDW: 18 % — ABNORMAL HIGH (ref 11.5–15.5)
WBC: 15 10*3/uL — AB (ref 4.0–10.5)

## 2015-09-02 LAB — GLUCOSE, CAPILLARY
GLUCOSE-CAPILLARY: 94 mg/dL (ref 65–99)
Glucose-Capillary: 100 mg/dL — ABNORMAL HIGH (ref 65–99)
Glucose-Capillary: 127 mg/dL — ABNORMAL HIGH (ref 65–99)
Glucose-Capillary: 136 mg/dL — ABNORMAL HIGH (ref 65–99)
Glucose-Capillary: 97 mg/dL (ref 65–99)

## 2015-09-02 MED ORDER — PANTOPRAZOLE SODIUM 40 MG PO PACK
40.0000 mg | PACK | Freq: Every day | ORAL | Status: DC
  Administered 2015-09-02 – 2015-09-03 (×2): 40 mg
  Filled 2015-09-02 (×4): qty 20

## 2015-09-02 MED ORDER — VANCOMYCIN HCL IN DEXTROSE 750-5 MG/150ML-% IV SOLN
750.0000 mg | Freq: Three times a day (TID) | INTRAVENOUS | Status: DC
Start: 2015-09-02 — End: 2015-09-02
  Administered 2015-09-02 (×2): 750 mg via INTRAVENOUS
  Filled 2015-09-02 (×2): qty 150

## 2015-09-02 MED ORDER — POTASSIUM CHLORIDE 20 MEQ/15ML (10%) PO SOLN
40.0000 meq | Freq: Once | ORAL | Status: AC
  Administered 2015-09-02: 40 meq
  Filled 2015-09-02: qty 30

## 2015-09-02 NOTE — Progress Notes (Signed)
ANTIBIOTIC CONSULT NOTE - FOLLOW UP  Pharmacy Consult for Vancomycin, Ceftazidime Indication: pneumonia  No Known Allergies  Patient Measurements: Height: 6\' 1"  (185.4 cm) Weight: 194 lb 14.4 oz (88.406 kg) IBW/kg (Calculated) : 79.9  Vital Signs: Temp: 98 F (36.7 C) (09/10 0436) Temp Source: Oral (09/10 0436) BP: 130/60 mmHg (09/10 0436) Pulse Rate: 79 (09/10 0436) Intake/Output from previous day: 09/09 0701 - 09/10 0700 In: 9509 [P.O.:120; I.V.:20; NG/GT:118; IV Piggyback:850; TPN:250] Out: 800 [Urine:800]  Labs:  Recent Labs  08/31/15 0535 08/31/15 2341 09/01/15 0543 09/02/15 0448  WBC 22.8*  --  17.9* 15.0*  HGB 10.4*  --  9.4* 9.1*  PLT 358  --  310 327  CREATININE 0.60* 0.60* 0.57* 0.54*   Estimated Creatinine Clearance: 98.5 mL/min (by C-G formula based on Cr of 0.54).  Recent Labs  08/30/15 2108 09/02/15 0448  VANCOTROUGH 17 22*     Assessment: 69 yr Stewart admitted 8/28 with fever and SOB. PMH includes NonHodgkin's Lymphoma, undergoing chemotherapy. CXR shows ARDS with likely superimposed PNA, likely atypical pna in immunocompromised pt.  ID consulted, possible PCP PNA, or G-CSF related pneumonitis (received Neulasta 8/18).  He completed 8 days of broad spectrum antibiotics on 9/5.  Later that day, he acutely desaturated, was febrile, and had stat bronc performed that revealed purulent secretions.  Pharmacy was consulted to dose Vancomycin and ceftazidime.  Renal: scr stable WBC improving afebrile  8/28 >>zosyn >> 9/5  8/29 >>vanc >> 8/29, 9/5 >> Vanc >>  8/29 >> azithromycin >> 8/31  8/30 >> bactrim >> 8/31  9/5 >> Ceftaz >>  8/28 blood: NGF  8/29 blood x2: NGF  8/29 urine: NGF  8/29 legionella and strep pneumoniae urine antigens: neg/neg  8/29 resp virus panel: negative  8/30 BAL pneumocystis smear: negative  8/30 BAL cx: oral flora  8/30 CMV studies: IgG+, IgM- , PCR = POSITIVE (ID believes this is asymptomatic shedding and does not need  treatment) 8/30 BAL fungus culture with smear: ngtd  8/31 repeat resp virus panel: negative  9/4 Cdiff: ordered, no sample  9/5 Trach asp: few Candida Albicans   Trough/Dose change info:  9/7 2130 VT = 17 (before 8th dose on 1gm q8h) 9/10 0500 VT = 53mcg/ml on 1gm q8h  Goal of Therapy:  Vancomycin trough level 15-20 mcg/ml Appropriate abx dosing, eradication of infection.   Plan:  Day #5 of 8 per ID recommended duration of therapy  Continue Ceftazidime 2g IV q8h  Based on vancomycin trough level this am (note that previous dose was given 52min late and trough drawn 50min early so level likely higher than true trough), decrease Vancomycin to 750mg  IV q8h for a new predicted trough of 15.78mcg/ml.  Follow renal function   Doreene Eland, PharmD, BCPS.   Pager: [phone] 09/02/2015 7:14 AM

## 2015-09-02 NOTE — Progress Notes (Signed)
[name] Kitchen   HEMATOLOGY/ONCOLOGY INPATIENT PROGRESS NOTE   Date of Service: 09/02/2015  Inpatient Attending: .Modena Jansky, MD   SUBJECTIVE  [name] was seen this afternoon. He is out of the MICU and off oxygen. No shortness of breath. No chest pain. Appears much more positive. Worked with Physical therapy today and was able to sit in a chair for a little while. He appears glad to be spending time with his long time friend.  LFTs were noted to be elevated on labs today. Patient had no fever and no abdominal pain. [place] could not be done this AM since his tube feeding was on. Rescheduled for tomorrow AM.     OBJECTIVE:  PHYSICAL EXAMINATION: . Filed Vitals:   09/01/15 2005 09/02/15 0002 09/02/15 0436 09/02/15 1025  BP: 134/62 126/61 130/60 129/64  Pulse: 79 80 79 85  Temp: 98.1 F (36.7 C) 97.4 F (36.3 C) 98 F (36.7 C) 98 F (36.7 C)  TempSrc: Oral Oral Oral Oral  Resp: '20 20 20 20  ' Height:      Weight:   194 lb 14.4 oz (88.406 kg)   SpO2: 94% 92% 92% 95%   GENERAL: awake, breathing comfortably. SKIN: no acute rashes EYES: open, tracking movement making good eye contact today. OROPHARYNX: no exudates NECK: no JVD, C- collar on for C-spine protection. LYMPH:  no palpable lymphadenopathy in the cervical, axillary LUNGS:CTA b/l HEART: regular rate & rhythm,  no murmurs and bilateral lower extremity 1+ edema. ABDOMEN: abdomen soft, non-tender, normoactive bowel sounds  NEURO: alert , oriented x 3, baseline RUE weakness overall appears. PSYCH: More positive today. No suicidal or homicidal ideation. No overt delusions. Delirium resolving   ALLERGIES:  has No Known Allergies.  MEDICATIONS:  Scheduled Meds: . antiseptic oral rinse  7 mL Mouth Rinse q12n4p  . cefTAZidime (FORTAZ)  IV  2 g Intravenous 3 times per day  . chlorhexidine  15 mL Mouth Rinse BID  . enoxaparin (LOVENOX) injection  40 mg Subcutaneous Q24H  . feeding supplement (PRO-STAT SUGAR FREE 64)  30 mL Oral  BID  . free water  100 mL Per Tube 4 times per day  . insulin aspart  0-9 Units Subcutaneous 6 times per day  . pantoprazole sodium  40 mg Per Tube Daily  . sodium chloride  10-40 mL Intracatheter Q12H  . vancomycin  750 mg Intravenous Q8H   Continuous Infusions: . feeding supplement (VITAL 1.5 CAL) 1,000 mL (09/02/15 1100)   PRN Meds:.acetaminophen **OR** acetaminophen, albuterol, diazepam, neomycin-bacitracin-polymyxin, [DISCONTINUED] ondansetron **OR** ondansetron (ZOFRAN) IV, RESOURCE THICKENUP CLEAR, sodium chloride, sodium chloride  REVIEW OF SYSTEMS:    10 Point review of Systems was done is negative except as noted above.   LABORATORY DATA: . CBC  . CBC Latest Ref Rng 09/02/2015 09/01/2015 08/31/2015  WBC 4.0 - 10.5 K/uL 15.0(H) 17.9(H) 22.8(H)  Hemoglobin 13.0 - 17.0 g/dL 9.1(L) 9.4(L) 10.4(L)  Hematocrit 39.0 - 52.0 % 27.8(L) 28.5(L) 31.5(L)  Platelets 150 - 400 K/uL 327 310 358    . CMP Latest Ref Rng 09/02/2015 09/01/2015 08/31/2015  Glucose 65 - 99 mg/dL 98 112(H) 152(H)  BUN 6 - 20 mg/dL 25(H) 31(H) 34(H)  Creatinine 0.61 - 1.24 mg/dL 0.54(L) 0.57(L) 0.60(L)  Sodium 135 - 145 mmol/L 144 145 144  Potassium 3.5 - 5.1 mmol/L 3.3(L) 3.0(L) 2.9(L)  Chloride 101 - 111 mmol/L 109 107 107  CO2 22 - 32 mmol/L '28 30 30  ' Calcium 8.9 - 10.3 mg/dL 8.5(L) 8.5(L)  8.4(L)  Total Protein 6.5 - 8.1 g/dL 5.3(L) 5.3(L) -  Total Bilirubin 0.3 - 1.2 mg/dL 0.9 0.8 -  Alkaline Phos 38 - 126 U/L 176(H) 110 -  AST 15 - 41 U/L 252(H) 75(H) -  ALT 17 - 63 U/L 265(H) 121(H) -     RADIOGRAPHIC STUDIES:ASSESSMENT & PLAN:   #1  Diffuse large B-cell lymphoma stage IV AE with involvement of mesenteric, inguinal lymph nodes and C5 vertebral body with spinal cord impingement.  He had a C5 corpectomy on 07/26/2015 that showed diffuse large B-cell lymphoma with a Ki-67 ranging from 20 to 90%.    DLBCL (diffuse large B cell lymphoma)   06/21/2015 Imaging CT Chest/abd/pelvis: IMPRESSION: 5 mm nodule  within the right lower lobe as described.  Pneumobilia consistent with a prior sphincterotomy.  No acute abnormality is identified. No findings to suggest chest etiology for the known lesion at C5 are seen.   06/21/2015 Imaging MRI c spine1. Diffuse marrow replacement of the C5 vertebral body highly concerning for neoplasm (metastatic disease, myeloma, or lymphoma). Epidural tumor at this level results in moderate spinal stenosis with moderate impression on the spinal cord    07/10/2015 PET scan 1. The previously demonstrated abnormal C5 vertebral body is hypermetabolic. No other osseous lesions demonstrated. 2. There are small hypermetabolic lymph nodes within the left mesentery and both inguinal regions.    07/26/2015 Initial Biopsy Had a C5 corpectomy pathology showed diffuse large B-cell lymphoma   08/08/2015 -  Chemotherapy Started for cycle of R CHOP.    08/09/2015 -  Chemotherapy Received first cycle of intrathecal methotrexate plus hydrocortisone for CNS prophylaxis.   He is currently s/p 1 st cycle of R-CHOP on 08/08/2015 and was scheduled for 2nd cycle of R-CHOP on 08/31/2015.   Plan Aggressive input by therapies Please try to get patient to recliner with the help of lift if needed. -wound care for sacral decubitus ulcer _TCU on discharge. Active therapies. -He was due for cycle 2 on 08/31/2015. Will preferably need to avoid >7-10 day delay in cycle 2. Will need TCU that will be able to transport him to cancer center for chemotherapy and then weekly visits. -I will schedule him for clinic follow up and 2nd cycle of chemotherapy on 09/11/2015. No Neulasta. Will plan to use R-mini CHOP -added neulasta to allergy list. -PLZ consult neurosurgery Dr Ronnald Ramp on Monday 9/12 to evaluate post-operative status of c-spine now that patient is 5 weeks out from surgery to determine if he can discontinue hard c-collar since that would help with swallowing and movements.  #2 ARDS with fevers and bilateral  groundglass pulmonary opacities.Resolved. Noncardiogenic pulmonary edema normal BNP. Mild troponin leak due to demand ischemia which normalized. Neulasta related ARDS versus HCAP.  Lungs much improved patient has been extubated , afebrile, out of the MICU 09/01/2015 on room air. Blood cultures negative thus far Viral respiratory panel negative Bronchoalveolar lavage with no overt organisms seen on Gram stain. PCP negative. CMV IgG positive IgM negative PCR qualitative positive. Quantitative PCR<200 Elevated sedimentation rate and CRP now showing improvement which is a good sign. Urine testing negative for Legionella antigen and Streptococcus pneumoniae. Repeat BAL showed Candida which is unlikely incidental organism.  #3 hypoxic respiratory failure due to #2 much improved now also went and with decreasing oxygen requirements.  #4 Anemia related to chemotherapy and lymphoma as well as possible sepsis. #5  leukocytosis likely due to Neulasta and steroids and less likely due to ongoing sepsis . #  6 ICU delirium - meds/intubation/steroids/infection -now resolved. #7 protein calorie malnutrition #8 significant muscle wasting due to immobilization, steroids, nutritional limitations,, neoplasm. #9 symptomatically anemia due to chemotherapy plus multiple blood draws plus poor bone marrow reserve. #10 hypokalemia #11 sacral decubitus ulcer #12 Elevated transaminases and Alkaline Phosphatase- likely related to Ceftazidime Patient was not getting cymbalta and abilify and these have been discontinued. Likely likely acalculous cholecystitis or biliary sludge. No abdominal pain/tenderness.  Plan -PLZ Discuss with ID about changing ceftazimidime due to bump in LFTs  (informed hospitalist) -will get [place] abd tomorrow AM ..will need to hold tube feeding for 6 hours prior to scheduled test. -We will have to avoid using any G-CSF in the future. -getting [place] feeding temporarily. Very keen to eat orally. Swallow  evaluation in progress. Consult neurosurgery (Dr Ronnald Ramp) about possibly discontinuing the hard c-collar-- will help with swallowing. -only irradiated blood products for transfusion. -will continue to follow up daily. #10 Delirium -resolved. In good spirits #11 h/o MDD Plan -re-orienting activities. -active engagement in goal oriented therapies as much as possible would be most therapeutic. -off steroids and minimize other sedatives as much as possible. -maintain sleep/wakeful cycles.-off cymbalta and abilify since he could not take them orally.  -minimize hepatotoxins in the setting of elevated LFts  Will continue to follow. Provided update to his wife at bedside.   The total time spent  was 35 minutes and more than 50% was on counseling and direct patient cares.    Sullivan Lone MD [place] AAHIVMS [facility] Hematology/Oncology Physician [place]  (Office):       [phone] (Work cell):  [phone] (Fax):           [phone]

## 2015-09-02 NOTE — Progress Notes (Signed)
Physical Therapy Treatment Patient Details Name: Ethan Stewart MRN: [id number] DOB: [birth year] Today's Date: 09/02/2015    History of Present Illness 69 y.o. M with diffuse large B cell lymphoma, brought to [facility] ED 8/29 with HCAP. Developed worsening hypoxia and respiratory distress, Intubated 8/30-08/29/15. H/O Cervical corpectomy for tumor with residual RUE weakness on 07/26/15.    PT Comments    Pt with increased alertness today.  Pt able to stand with MAX A of 2 with EVA Walker and Stedy.  Stedy used to get into recliner and nurse aware on how to A pt with transfer.   Pt verbalized that he didn't realize he was that weak.  Pt seems to be having increased insight into his current level of deficits.  Pt and wife were very pleased and appreciative of PT services today.  Follow Up Recommendations  SNF;Supervision/Assistance - 24 hour     Equipment Recommendations  None recommended by PT    Recommendations for Other Services       Precautions / Restrictions Precautions Precautions: Fall Precaution Comments: profound weakness Required Braces or Orthoses: Cervical Brace Cervical Brace: Hard collar;At all times Restrictions Weight Bearing Restrictions: No    Mobility  Bed Mobility Overal bed mobility: Needs Assistance Bed Mobility: Supine to Sit     Supine to sit: Max assist     General bed mobility comments: Pt was able to get legs to EOB, but required MAX A for trunk   Transfers Overall transfer level: Needs assistance Equipment used: Ambulation equipment used;Rolling walker (2 wheeled) (eva walker) Transfers: Sit to/from [facility] Sit to Stand: Mod assist;+2 physical assistance;Max assist Stand pivot transfers: Total assist       General transfer comment: Stood to eva walker x2 with MOD A of 2on 1st rep and MAX of 2 on 2nd with PT A with R UE.  Cues for extending knees and hips.  3rd time stood to [facility] and transferred to recliner and MAX A for stand >  sit due to weak eccentric control of muscles.  Ambulation/Gait                 Stairs            Wheelchair Mobility    Modified Rankin (Stroke Patients Only)       Balance Overall balance assessment: Needs assistance Sitting-balance support: Feet supported;Bilateral upper extremity supported Sitting balance-Leahy Scale: Fair Sitting balance - Comments: Fair balance when UE supported, but without UE support he leans posteriorly.   Standing balance support: Bilateral upper extremity supported Standing balance-Leahy Scale: Zero Standing balance comment: Requires MAX of 2 and heavy cueing for extension of LE                    Cognition Arousal/Alertness: Awake/alert Behavior During Therapy: WFL for tasks assessed/performed Overall Cognitive Status: Within Functional Limits for tasks assessed         Following Commands: Follows one step commands consistently;Follows one step commands with increased time       General Comments: Pt stated at beginning he wanted to walk, but after standing he states he didn't realize how hard standing would be.    Exercises General Exercises - Lower Extremity Ankle Circles/Pumps: AROM;Both;10 reps;Supine Gluteal Sets: Strengthening;15 reps;Supine Heel Slides: AROM;Both;10 reps;Supine    General Comments        Pertinent Vitals/Pain Pain Assessment: No/denies pain    Home Living  Prior Function            PT Goals (current goals can now be found in the care plan section) Acute Rehab PT Goals Patient Stated Goal: per wife, to get stronger. PT Goal Formulation: With patient/family Time For Goal Achievement: 09/12/15 Potential to Achieve Goals: Good Progress towards PT goals: Progressing toward goals    Frequency  Min 3X/week    PT Plan Current plan remains appropriate    Co-evaluation             End of Session Equipment Utilized During Treatment: Gait  belt Activity Tolerance: Patient limited by fatigue Patient left: in chair;with call bell/phone within reach;with nursing/sitter in room;with family/visitor present     Time: 0076-2263 PT Time Calculation (min) (ACUTE ONLY): 27 min  Charges:  $Therapeutic Activity: 23-37 mins                    G Codes:      Ethan Stewart 09/02/2015, 2:02 PM

## 2015-09-02 NOTE — Progress Notes (Addendum)
PROGRESS NOTE    Ethan Stewart BSJ:[id number] DOB: 1946/08/04 DOA: 08/20/2015 PCP:  Melinda Crutch, MD  HPI/Brief narrative 69 y.o. M with diffuse large B cell lymphoma, brought to [facility] ED 8/29 with HCAP & ARDS. R-CHOP Chemo Rx last given on 08/09/2015. Status post C5 corpectomy on 8/3. Patient worsened and intubated on 8/30. Care was taken over by CCM and transferred back to [facility] on 09/01/15.   Assessment/Plan:  Healthcare associated pneumonia - Has been on multiple antibiotics since 8/29. Infectious disease is following. His initial bronchoscopy did not reveal much airway inflammation or secretions. However he subsequently worsened and repeat bronchoscopy 08/28/15 showed purulent secretions. - As per ID continue vancomycin and Fortaz for 4 days beginning 9/8 -supposed to discontinue tomorrow but some concern regarding elevated LFTs due to [place]. Discussed antibiotic regimen with infectious disease M.D. on call and plan to discontinue all antibiotics and monitor.  Acute respiratory failure secondary to ARDS & HCAP - Resolved. ETT 8/30 > 9/6 - Patient was on Decadron for possible pneumonitis. Decadron discontinued  Barotrauma with subcutaneous emphysema 9/2 - Intermittent chest x-ray. Pulmonary toilet  Mild troponin leak - Suspect demand ischemia  Hypertension - Controlled  Hypernatremia  - Improved/resolved   Hypokalemia - Replace as needed and follow  GERD - PPI  Dysphagia - Failed swallow eval 9/8. Expect to improve. Meanwhile CCM placed Panda tube 9/9 and started tube feeds. Avoid TNA  Diffuse large B-cell lymphoma, s/p one cycle of R-CHOP - Oncology follow-up appreciated  Anemia - Stable. Follow CBCs  Anxiety - On Valium  Hx C5 tumor - s/p C5 corpectomy with C4-C6 arthrodesis and anterior cervical plating of C4-C6 (Dr. Ronnald Ramp - 07/26/15) - Continue cervical collar. Has follow-up appointment with Dr. Ronnald Ramp on 9/19 at which time collar is supposed to come off. - Has had  right upper extremity monoparesis post surgery  Critical illness deconditioning/functional quadriplegia - PT and OT with plans for SNF for rehabilitation on discharge.  Depression/paranoia - Psychiatry input appreciated. Patient started on Celexa and Abilify. Appreciated oncology concern regarding Abilify interacting with chemotherapy. Updated psychiatry and await further recommendations. Psychiatric medications seem to have been discontinued.  Delirium - Improving or even resolved.  Hyperglycemia - SSI  Abnormal LFTs - Transaminitis jumped from close 200s to 250s range-no obvious GI symptoms. Possibly from TNA-now discontinued. Follow LFTs in a.m. Oncology as ordered abdominal ultrasound   DVT prophylaxis: Lovenox  Code Status: Full  Family Communication: Discussed with on and daughter at bedside on 9/10 Disposition Plan: Transferred to telemetry on 9/9. DC to SNF when medically stable, possibly in the next 3-4 days   Consultants:  CCM-signed off 9/9   Infectious disease  Oncology  Procedures:  ETT 8/30 > 9/6  LUE PICC line  Panda tube  Antibiotics: Zosyn, start date 8/29 >> 9/5 Azithro, start date 8/29 >> 9/2 Bactrim, start date 8/29 >> 8/31 Vanc 8/29 >> 9/1 Zosyn 8/28 >> 9/5 Vanco 9/5 >> Ceftazidime 9/5 >>  Subjective: Feels much better this morning. Denied specific complaints. Tolerating tube feeds at 50 mL an hour.  Objective: Filed Vitals:   09/01/15 2005 09/02/15 0002 09/02/15 0436 09/02/15 1025  BP: 134/62 126/61 130/60 129/64  Pulse: 79 80 79 85  Temp: 98.1 F (36.7 C) 97.4 F (36.3 C) 98 F (36.7 C) 98 F (36.7 C)  TempSrc: Oral Oral Oral Oral  Resp: 20 20 20 20   Height:      Weight:   88.406 kg (194 lb  14.4 oz)   SpO2: 94% 92% 92% 95%    Intake/Output Summary (Last 24 hours) at 09/02/15 1529 Last data filed at 09/02/15 1515  Gross per 24 hour  Intake 1982.67 ml  Output   1100 ml  Net 882.67 ml   Filed Weights   08/31/15 0500  09/01/15 1400 09/02/15 0436  Weight: 86.1 kg (189 lb 13.1 oz) 88.225 kg (194 lb 8 oz) 88.406 kg (194 lb 14.4 oz)     Exam:  General exam: pleasant middle-aged male sitting up comfortably propped up in bed without distress. Has cervical collar in place.  Respiratory system: reduced breath sounds in the bases but otherwise clear to auscultation. No increased work of breathing. Telemetry: Sinus rhythm with frequent PVCs-does not seem like NSVT (had 4 beat of PVCs Cardiovascular system: S1 & S2 heard, RRR. No JVD, murmurs, gallops, clicks or pedal edema. Gastrointestinal system: Abdomen is nondistended, soft and nontender. Normal bowel sounds heard. Central nervous system: Alert and oriented. No focal neurological deficits. Extremities: Symmetric 5 x 5 power except right upper extremity with grade 3 x 5 power.   Data Reviewed: Basic Metabolic Panel:  Recent Labs Lab 08/28/15 0416 08/29/15 0310 08/30/15 0552 08/31/15 0535 08/31/15 2341 09/01/15 0543 09/02/15 0448  NA 147* 148* 146* 147* 144 145 144  K 4.2 4.4 3.7 3.2* 2.9* 3.0* 3.3*  CL 103 106 107 107 107 107 109  CO2 37* 37* 32 31 30 30 28   GLUCOSE 145* 142* 157* 118* 152* 112* 98  BUN 46* 49* 45* 37* 34* 31* 25*  CREATININE 0.67 0.57* 0.68 0.60* 0.60* 0.57* 0.54*  CALCIUM 8.6* 8.4* 8.4* 8.7* 8.4* 8.5* 8.5*  MG 2.6* 2.5*  --   --   --  2.3  --   PHOS 3.8 3.5  --   --   --  2.6  --    Liver Function Tests:  Recent Labs Lab 08/27/15 0451 09/01/15 0543 09/02/15 0448  AST 53* 75* 252*  ALT 30 121* 265*  ALKPHOS 98 110 176*  BILITOT 0.7 0.8 0.9  PROT 5.1* 5.3* 5.3*  ALBUMIN 2.1* 2.1* 2.2*   No results for input(s): LIPASE, AMYLASE in the last 168 hours. No results for input(s): AMMONIA in the last 168 hours. CBC:  Recent Labs Lab 08/27/15 0451 08/29/15 0310 08/30/15 0552 08/31/15 0535 09/01/15 0543 09/02/15 0448  WBC 18.5* 18.1* 18.1* 22.8* 17.9* 15.0*  NEUTROABS 16.7* 16.2*  --   --  15.9*  --   HGB 8.7*  8.7* 7.9* 10.4* 9.4* 9.1*  HCT 27.0* 27.2* 25.1* 31.5* 28.5* 27.8*  MCV 90.3 93.5 91.6 88.2 88.2 88.8  PLT 399 410* 359 358 310 327   Cardiac Enzymes: No results for input(s): CKTOTAL, CKMB, CKMBINDEX, TROPONINI in the last 168 hours. BNP (last 3 results) No results for input(s): PROBNP in the last 8760 hours. CBG:  Recent Labs Lab 09/01/15 2001 09/01/15 2352 09/02/15 0424 09/02/15 0738 09/02/15 1159  GLUCAP 100* 136* 100* 94 97    Recent Results (from the past 240 hour(s))  Respiratory virus panel     Status: None   Collection Time: 08/23/15 11:47 PM  Result Value Ref Range Status   Respiratory Syncytial Virus A Negative Negative Final   Respiratory Syncytial Virus B Negative Negative Final   Influenza A Negative Negative Final   Influenza B Negative Negative Final   Parainfluenza 1 Negative Negative Final   Parainfluenza 2 Negative Negative Final   Parainfluenza 3 Negative Negative Final  Metapneumovirus Negative Negative Final   Rhinovirus Negative Negative Final   Adenovirus Negative Negative Final    Comment: (NOTE) Performed At: [facility] [place], [place] [id number] Lindon Romp MD [id number]   Culture, respiratory (NON-Expectorated)     Status: None   Collection Time: 08/28/15 10:54 AM  Result Value Ref Range Status   Specimen Description TRACHEAL ASPIRATE  Final   Special Requests NONE  Final   Gram Stain   Final    FEW WBC PRESENT,BOTH PMN AND MONONUCLEAR RARE SQUAMOUS EPITHELIAL CELLS PRESENT YEAST Performed at [facility]    Culture   Final    FEW CANDIDA ALBICANS Performed at [facility]    Report Status 08/30/2015 FINAL  Final           Studies: Dg Chest Port 1 View  09/01/2015   CLINICAL DATA:  ARDS.  EXAM: PORTABLE CHEST - 1 VIEW  COMPARISON:  08/30/2015.  FINDINGS: Left PICC line in stable position . Cardiomegaly. Low lung volumes with stable bibasilar atelectasis and/or infiltrates.  No pleural effusion or pneumothorax. Right supraclavicular subcutaneous emphysema is stable.  IMPRESSION: 1. Left PICC line stable position. 2. Low lung volumes with stable bibasilar subsegmental atelectasis and/or infiltrates. 3. Stable cardiomegaly. 4. Right supraclavicular subcutaneous emphysema unchanged. No pneumothorax.   Electronically Signed   By: Marcello Moores  Register   On: 09/01/2015 07:02   Dg Abd Portable 1v  09/01/2015   CLINICAL DATA:  Feeding tube placement  EXAM: PORTABLE ABDOMEN - 1 VIEW  COMPARISON:  08/22/2015  FINDINGS: Dobbhoff feeding tube is seen projecting over the stomach along the greater curvature with tip in the region of the antrum/pylorus.  IMPRESSION: Dobbhoff feeding tube as described   Electronically Signed   By: Skipper Cliche M.D.   On: 09/01/2015 12:21        Scheduled Meds: . antiseptic oral rinse  7 mL Mouth Rinse q12n4p  . cefTAZidime (FORTAZ)  IV  2 g Intravenous 3 times per day  . chlorhexidine  15 mL Mouth Rinse BID  . enoxaparin (LOVENOX) injection  40 mg Subcutaneous Q24H  . feeding supplement (PRO-STAT SUGAR FREE 64)  30 mL Oral BID  . free water  100 mL Per Tube 4 times per day  . insulin aspart  0-9 Units Subcutaneous 6 times per day  . pantoprazole sodium  40 mg Per Tube Daily  . sodium chloride  10-40 mL Intracatheter Q12H  . vancomycin  750 mg Intravenous Q8H   Continuous Infusions: . feeding supplement (VITAL 1.5 CAL) 1,000 mL (09/02/15 1100)    Principal Problem:   MDD (major depressive disorder), recurrent severe, without psychosis Active Problems:   DLBCL (diffuse large B cell lymphoma)   HCAP (healthcare-associated pneumonia)   Sepsis   Anemia associated with chemotherapy   Sinus tachycardia   GERD (gastroesophageal reflux disease)   Blood poisoning   Hypoxia   Acute on chronic respiratory failure with hypoxia   ARDS (adult respiratory distress syndrome)   Acute respiratory failure   Pressure ulcer   Dysphagia,  pharyngoesophageal phase   Protein-calorie malnutrition, severe   Acute respiratory failure with hypoxemia   Hypokalemia   Dysphagia    Time spent: 20 minutes.    Vernell Leep, MD, FACP, FHM. [facility] Pager [phone]  If 7PM-7AM, please contact night-coverage [email or web address] Password [email or web address] 09/02/2015, 3:29 PM    LOS: 12 days

## 2015-09-02 NOTE — Progress Notes (Signed)
PHARMACIST - PHYSICIAN COMMUNICATION Key Points: Use following P&T approved IV to PO change policy.  Description contains the criteria that are approved Note: Policy Excludes:  Esophagectomy patients  DR:   TRH CONCERNING: IV to Oral Route Change Policy  RECOMMENDATION: This patient is receiving pantoprazole by the intravenous route.  Based on criteria approved by the Pharmacy and Therapeutics Committee, the intravenous medication(s) is/are being converted to the equivalent oral dose form(s).   DESCRIPTION: These criteria include:  The patient is eating (either orally or via tube) and/or has been taking other orally administered medications for a least 24 hours  The patient has no evidence of active gastrointestinal bleeding or impaired GI absorption (gastrectomy, short bowel, patient on TNA or NPO).  If you have questions about this conversion, please contact the Pharmacy Department  []   [phone] )  [facility] []   [phone] )  [facility] []   [phone] )  [facility] []   [phone] )  [facility] [x]   [phone] )  [place], [place], [facility] 09/02/2015 7:36 AM

## 2015-09-03 ENCOUNTER — Inpatient Hospital Stay (HOSPITAL_COMMUNITY): Payer: Non-veteran care

## 2015-09-03 DIAGNOSIS — F332 Major depressive disorder, recurrent severe without psychotic features: Secondary | ICD-10-CM

## 2015-09-03 LAB — CBC WITH DIFFERENTIAL/PLATELET
BASOS ABS: 0 10*3/uL (ref 0.0–0.1)
Basophils Relative: 0 % (ref 0–1)
EOS PCT: 1 % (ref 0–5)
Eosinophils Absolute: 0.1 10*3/uL (ref 0.0–0.7)
HCT: 28.3 % — ABNORMAL LOW (ref 39.0–52.0)
Hemoglobin: 9.3 g/dL — ABNORMAL LOW (ref 13.0–17.0)
LYMPHS ABS: 0.7 10*3/uL (ref 0.7–4.0)
LYMPHS PCT: 5 % — AB (ref 12–46)
MCH: 29.1 pg (ref 26.0–34.0)
MCHC: 32.9 g/dL (ref 30.0–36.0)
MCV: 88.4 fL (ref 78.0–100.0)
MONO ABS: 1.1 10*3/uL — AB (ref 0.1–1.0)
MONOS PCT: 8 % (ref 3–12)
Neutro Abs: 10.9 10*3/uL — ABNORMAL HIGH (ref 1.7–7.7)
Neutrophils Relative %: 86 % — ABNORMAL HIGH (ref 43–77)
PLATELETS: 312 10*3/uL (ref 150–400)
RBC: 3.2 MIL/uL — ABNORMAL LOW (ref 4.22–5.81)
RDW: 17.9 % — AB (ref 11.5–15.5)
WBC: 12.8 10*3/uL — ABNORMAL HIGH (ref 4.0–10.5)

## 2015-09-03 LAB — COMPREHENSIVE METABOLIC PANEL
ALT: 234 U/L — AB (ref 17–63)
AST: 129 U/L — AB (ref 15–41)
Albumin: 2 g/dL — ABNORMAL LOW (ref 3.5–5.0)
Alkaline Phosphatase: 174 U/L — ABNORMAL HIGH (ref 38–126)
Anion gap: 5 (ref 5–15)
BILIRUBIN TOTAL: 0.5 mg/dL (ref 0.3–1.2)
BUN: 19 mg/dL (ref 6–20)
CHLORIDE: 111 mmol/L (ref 101–111)
CO2: 27 mmol/L (ref 22–32)
CREATININE: 0.55 mg/dL — AB (ref 0.61–1.24)
Calcium: 8 mg/dL — ABNORMAL LOW (ref 8.9–10.3)
GFR calc Af Amer: >60 mL/min (ref >60)
GLUCOSE: 93 mg/dL (ref 65–99)
Potassium: 3.5 mmol/L (ref 3.5–5.1)
Sodium: 143 mmol/L (ref 135–145)
Total Protein: 4.9 g/dL — ABNORMAL LOW (ref 6.5–8.1)

## 2015-09-03 LAB — MAGNESIUM: MAGNESIUM: 2.2 mg/dL (ref 1.7–2.4)

## 2015-09-03 LAB — GLUCOSE, CAPILLARY
GLUCOSE-CAPILLARY: 84 mg/dL (ref 65–99)
GLUCOSE-CAPILLARY: 100 mg/dL — AB (ref 65–99)
GLUCOSE-CAPILLARY: 101 mg/dL — AB (ref 65–99)
Glucose-Capillary: 113 mg/dL — ABNORMAL HIGH (ref 65–99)
Glucose-Capillary: 119 mg/dL — ABNORMAL HIGH (ref 65–99)
Glucose-Capillary: 119 mg/dL — ABNORMAL HIGH (ref 65–99)
Glucose-Capillary: 127 mg/dL — ABNORMAL HIGH (ref 65–99)

## 2015-09-03 MED ORDER — POTASSIUM CHLORIDE 20 MEQ/15ML (10%) PO SOLN
40.0000 meq | Freq: Once | ORAL | Status: AC
  Administered 2015-09-03: 40 meq
  Filled 2015-09-03: qty 30

## 2015-09-03 NOTE — Clinical Social Work Note (Signed)
CSW met with pt and his wife at bedside to present SNF bed options  CSW discussed [facility] as the only SNF that stated they could accommodate pt for rehab  CSW reviewed hospital face sheet that reflected pt insurance of [place] only.  Both pt and wife stated that pt has medicare A and B as well as [employer] advantage but this information is not on hospital face sheet that went out to SNF's.  Only insurance listed on face sheet is [place].  [employer] is their 3rd additional insurance.  Pt and wife could not provide any insurance cards for review so CSW asked that pt wife provide these tomorrow to week day CSW so that the information could be resent to SNF's and hopefully get more choices for rehab  Pt and wife also discussed that pt has a swallow test tomorrow and MD is hoping to get off tube feeding.  If this occurs it will open up more possiblites for SNF/rehab as well.  Pt and wife are hoping to get pt's chemo done before hospital discharge.  This will allow pt rehab to go uninterrupted.    Pt and wife are hopeful that [facility] will reconsider when insurance and other medical issues are resolved.  Information will have to be resent to SNF's once cards are produced and swallowing test is completed.  Dede Query, LCSW [place] Worker - Weekend Coverage cell #: [phone]

## 2015-09-03 NOTE — Progress Notes (Signed)
PROGRESS NOTE    Ethan Stewart ATF:[id number] DOB: 11-Mar-1946 DOA: 08/20/2015 PCP:  Melinda Crutch, MD  HPI/Brief narrative 69 y.o. M with diffuse large B cell lymphoma, brought to [facility] ED 8/29 with HCAP & ARDS. R-CHOP Chemo Rx last given on 08/09/2015. Status post C5 corpectomy on 8/3. Patient worsened and intubated on 8/30. Care was taken over by CCM and transferred back to [facility] on 09/01/15.   Assessment/Plan:  Healthcare associated pneumonia - Has been on multiple antibiotics since 8/29. Infectious disease is following. His initial bronchoscopy did not reveal much airway inflammation or secretions. However he subsequently worsened and repeat bronchoscopy 08/28/15 showed purulent secretions. - As per ID continue vancomycin and Fortaz for 4 days beginning 9/8 -supposed to discontinue tomorrow but some concern regarding elevated LFTs due to [place]. Discussed antibiotic regimen with infectious disease M.D. on call on 9/10 and discontinued all antibiotics and monitor. - Improved and stable  Acute respiratory failure secondary to ARDS & HCAP - Resolved. ETT 8/30 > 9/6 - Patient was on Decadron for possible pneumonitis. Decadron discontinued  Barotrauma with subcutaneous emphysema 9/2 - Intermittent chest x-ray. Pulmonary toilet  Mild troponin leak - Suspect demand ischemia  Hypertension - Controlled  Hypernatremia  - Improved/resolved   Hypokalemia - Replace as needed and follow  GERD - PPI  Dysphagia - Failed swallow eval 9/8. Expect to improve. Meanwhile CCM placed Panda tube 9/9 and started tube feeds. Avoid TNA. Reassess swallowing 9/12 by speech therapy. Discussed with neurosurgery 9/12 regarding removing c-collar.  Diffuse large B-cell lymphoma, s/p one cycle of R-CHOP - Oncology follow-up appreciated  Anemia - Stable.   Anxiety - On Valium  Hx C5 tumor - s/p C5 corpectomy with C4-C6 arthrodesis and anterior cervical plating of C4-C6 (Dr. Ronnald Ramp - 07/26/15) - Continue  cervical collar. Has follow-up appointment with Dr. Ronnald Ramp on 9/19 at which time collar is supposed to come off. - Has had right upper extremity monoparesis post surgery  Critical illness deconditioning/functional quadriplegia - PT and OT with plans for SNF for rehabilitation on discharge.  Depression/paranoia - Psychiatry input appreciated. Patient started on Celexa and Abilify. Appreciated oncology concern regarding Abilify interacting with chemotherapy. Updated psychiatry and await further recommendations. Psychiatric medications have been discontinued. Patient appears to be doing well off medications.  Delirium - resolved.  Hyperglycemia - SSI  Abnormal LFTs - Transaminitis jumped from close 200s to 250s range on 9/10-no obvious GI symptoms. Possibly from TNA-now discontinued versus IV Fortaz-discontinued 9/10. LFTs slightly better. RUQ ultrasound-no cholecystitis.   DVT prophylaxis: Lovenox  Code Status: Full  Family Communication: Discussed with spouse at bedside Disposition Plan: Transferred to telemetry on 9/9. DC to SNF when medically stable, possibly in the next 1-2 days   Consultants:  CCM-signed off 9/9   Infectious disease  Oncology  Procedures:  ETT 8/30 > 9/6  LUE PICC line  Panda tube  Antibiotics: Zosyn, start date 8/29 >> 9/5 Azithro, start date 8/29 >> 9/2 Bactrim, start date 8/29 >> 8/31 Vanc 8/29 >> 9/1 Zosyn 8/28 >> 9/5 Vanco 9/5 >> Ceftazidime 9/5 >>  Subjective: Tube feeds held for ultrasound abdomen this morning. Wants to start back tube feeds. Doing self physical therapy. No other complaints reported.  Objective: Filed Vitals:   09/02/15 1530 09/02/15 2030 09/03/15 0004 09/03/15 0419  BP: 115/61 132/69 119/71 118/62  Pulse: 91 98 95 81  Temp: 98.1 F (36.7 C) 98.3 F (36.8 C) 97.6 F (36.4 C) 98.6 F (37 C)  TempSrc:  Oral Oral Oral Oral  Resp: 20 20 22 20   Height:      Weight:    87.454 kg (192 lb 12.8 oz)  SpO2: 95% 96% 94%  95%    Intake/Output Summary (Last 24 hours) at 09/03/15 1129 Last data filed at 09/03/15 0900  Gross per 24 hour  Intake   1100 ml  Output    625 ml  Net    475 ml   Filed Weights   09/01/15 1400 09/02/15 0436 09/03/15 0419  Weight: 88.225 kg (194 lb 8 oz) 88.406 kg (194 lb 14.4 oz) 87.454 kg (192 lb 12.8 oz)     Exam:  General exam: pleasant middle-aged male sitting up comfortably propped up in bed without distress. Has cervical collar in place.  Respiratory system: reduced breath sounds in the bases but otherwise clear to auscultation. No increased work of breathing. Telemetry: Sinus rhythm. Cardiovascular system: S1 & S2 heard, RRR. No JVD, murmurs, gallops, clicks or pedal edema. Gastrointestinal system: Abdomen is nondistended, soft and nontender. Normal bowel sounds heard. Central nervous system: Alert and oriented. No focal neurological deficits. Extremities: Symmetric 5 x 5 power except right upper extremity with grade 3 x 5 power.   Data Reviewed: Basic Metabolic Panel:  Recent Labs Lab 08/28/15 0416 08/29/15 0310  08/31/15 0535 08/31/15 2341 09/01/15 0543 09/02/15 0448 09/03/15 0625  NA 147* 148*  < > 147* 144 145 144 143  K 4.2 4.4  < > 3.2* 2.9* 3.0* 3.3* 3.5  CL 103 106  < > 107 107 107 109 111  CO2 37* 37*  < > 31 30 30 28 27   GLUCOSE 145* 142*  < > 118* 152* 112* 98 93  BUN 46* 49*  < > 37* 34* 31* 25* 19  CREATININE 0.67 0.57*  < > 0.60* 0.60* 0.57* 0.54* 0.55*  CALCIUM 8.6* 8.4*  < > 8.7* 8.4* 8.5* 8.5* 8.0*  MG 2.6* 2.5*  --   --   --  2.3  --  2.2  PHOS 3.8 3.5  --   --   --  2.6  --   --   < > = values in this interval not displayed. Liver Function Tests:  Recent Labs Lab 09/01/15 0543 09/02/15 0448 09/03/15 0625  AST 75* 252* 129*  ALT 121* 265* 234*  ALKPHOS 110 176* 174*  BILITOT 0.8 0.9 0.5  PROT 5.3* 5.3* 4.9*  ALBUMIN 2.1* 2.2* 2.0*   No results for input(s): LIPASE, AMYLASE in the last 168 hours. No results for input(s):  AMMONIA in the last 168 hours. CBC:  Recent Labs Lab 08/29/15 0310 08/30/15 0552 08/31/15 0535 09/01/15 0543 09/02/15 0448 09/03/15 0625  WBC 18.1* 18.1* 22.8* 17.9* 15.0* 12.8*  NEUTROABS 16.2*  --   --  15.9*  --  10.9*  HGB 8.7* 7.9* 10.4* 9.4* 9.1* 9.3*  HCT 27.2* 25.1* 31.5* 28.5* 27.8* 28.3*  MCV 93.5 91.6 88.2 88.2 88.8 88.4  PLT 410* 359 358 310 327 312   Cardiac Enzymes: No results for input(s): CKTOTAL, CKMB, CKMBINDEX, TROPONINI in the last 168 hours. BNP (last 3 results) No results for input(s): PROBNP in the last 8760 hours. CBG:  Recent Labs Lab 09/02/15 1706 09/02/15 2024 09/03/15 0002 09/03/15 0414 09/03/15 0734  GLUCAP 127* 119* 127* 84 100*    Recent Results (from the past 240 hour(s))  Culture, respiratory (NON-Expectorated)     Status: None   Collection Time: 08/28/15 10:54 AM  Result Value Ref  Range Status   Specimen Description TRACHEAL ASPIRATE  Final   Special Requests NONE  Final   Gram Stain   Final    FEW WBC PRESENT,BOTH PMN AND MONONUCLEAR RARE SQUAMOUS EPITHELIAL CELLS PRESENT YEAST Performed at [facility]    Culture   Final    FEW CANDIDA ALBICANS Performed at [facility]    Report Status 08/30/2015 FINAL  Final           Studies: Dg Abd Portable 1v  09/01/2015   CLINICAL DATA:  Feeding tube placement  EXAM: PORTABLE ABDOMEN - 1 VIEW  COMPARISON:  08/22/2015  FINDINGS: Dobbhoff feeding tube is seen projecting over the stomach along the greater curvature with tip in the region of the antrum/pylorus.  IMPRESSION: Dobbhoff feeding tube as described   Electronically Signed   By: Skipper Cliche M.D.   On: 09/01/2015 12:21   US Abdomen Limited Ruq  09/03/2015   CLINICAL DATA:  69 year old male with a history of transaminitis.  EXAM: US ABDOMEN LIMITED - RIGHT UPPER QUADRANT  COMPARISON:  None.  FINDINGS: Gallbladder:  Fundus of the gallbladder not visualized secondary to bowel gas. Negative sonographic Murphy  sign. Visualized gallbladder wall not thickened. Uniformly anechoic fluid within the gallbladder with no stones or sludge identified. No pericholecystic fluid.  Common bile duct:  Diameter: 3 mm  Liver:  No focal lesion identified. Within normal limits in parenchymal echogenicity.  IMPRESSION: Sonographic survey negative for acute cholecystitis.  Signed,  Dulcy Fanny. Earleen Newport, DO  [facility]  [facility]   Electronically Signed   By: Corrie Mckusick D.O.   On: 09/03/2015 11:06        Scheduled Meds: . antiseptic oral rinse  7 mL Mouth Rinse q12n4p  . chlorhexidine  15 mL Mouth Rinse BID  . enoxaparin (LOVENOX) injection  40 mg Subcutaneous Q24H  . feeding supplement (PRO-STAT SUGAR FREE 64)  30 mL Oral BID  . free water  100 mL Per Tube 4 times per day  . insulin aspart  0-9 Units Subcutaneous 6 times per day  . pantoprazole sodium  40 mg Per Tube Daily  . sodium chloride  10-40 mL Intracatheter Q12H   Continuous Infusions: . feeding supplement (VITAL 1.5 CAL) 1,000 mL (09/03/15 1110)    Principal Problem:   MDD (major depressive disorder), recurrent severe, without psychosis Active Problems:   DLBCL (diffuse large B cell lymphoma)   HCAP (healthcare-associated pneumonia)   Sepsis   Anemia associated with chemotherapy   Sinus tachycardia   GERD (gastroesophageal reflux disease)   Blood poisoning   Hypoxia   Acute on chronic respiratory failure with hypoxia   ARDS (adult respiratory distress syndrome)   Acute respiratory failure   Pressure ulcer   Dysphagia, pharyngoesophageal phase   Protein-calorie malnutrition, severe   Acute respiratory failure with hypoxemia   Hypokalemia   Dysphagia   Elevated LFTs   Abnormal liver function tests    Time spent: 20 minutes.    Vernell Leep, MD, FACP, FHM. [facility] Pager [phone]  If 7PM-7AM, please contact night-coverage [email or web address] Password [email or web address] 09/03/2015, 11:29 AM     LOS: 13 days

## 2015-09-03 NOTE — Progress Notes (Signed)
[name]   HEMATOLOGY/ONCOLOGY INPATIENT PROGRESS NOTE   Date of Service: 09/03/2015  Inpatient Attending: .Modena Jansky, MD   SUBJECTIVE  [name] was seen this afternoon. He is in much better spirits. [place] abd with no evidence of cholecystitis. No other acute new symptoms. Was out of bed in a chair for about a hour today. He is looking forward to passing swallow evaluation so he can advance his diet. Good BM.    OBJECTIVE:  PHYSICAL EXAMINATION: . Filed Vitals:   09/03/15 0004 09/03/15 0419 09/03/15 1300 09/03/15 2007  BP: 119/71 118/62 120/58 139/66  Pulse: 95 81 93 84  Temp: 97.6 F (36.4 C) 98.6 F (37 C) 97.4 F (36.3 C) 98 F (36.7 C)  TempSrc: Oral Oral Oral Oral  Resp: _0 Height:      Weight:  192 lb 12.8 oz (87.454 kg)    SpO2: 94% 95% 96% 95%   GENERAL: awake, breathing comfortably. SKIN: no acute rashes EYES: open, tracking movement making good eye contact today. OROPHARYNX: no exudates NECK: no JVD, C- collar on for C-spine protection. LYMPH:  no palpable lymphadenopathy in the cervical, axillary LUNGS:CTA b/l HEART: regular rate & rhythm,  no murmurs and bilateral lower extremity 1+ edema. ABDOMEN: abdomen soft, non-tender, normoactive bowel sounds  NEURO: alert , oriented x 3, baseline RUE weakness overall appears. PSYCH: More positive today. No suicidal or homicidal ideation. No overt delusions. Delirium resolving   ALLERGIES:  is allergic to neulasta.  MEDICATIONS:  Scheduled Meds: . antiseptic oral rinse  7 mL Mouth Rinse q12n4p  . chlorhexidine  15 mL Mouth Rinse BID  . enoxaparin (LOVENOX) injection  40 mg Subcutaneous Q24H  . feeding supplement (PRO-STAT SUGAR FREE 64)  30 mL Oral BID  . free water  100 mL Per Tube 4 times per day  . insulin aspart  0-9 Units Subcutaneous 6 times per day  . pantoprazole sodium  40 mg Per Tube Daily  . sodium chloride  10-40 mL Intracatheter Q12H   Continuous Infusions: . feeding supplement (VITAL  1.5 CAL) 1,000 mL (09/03/15 1831)   PRN Meds:.acetaminophen **OR** acetaminophen, albuterol, diazepam, neomycin-bacitracin-polymyxin, [DISCONTINUED] ondansetron **OR** ondansetron (ZOFRAN) IV, RESOURCE THICKENUP CLEAR, sodium chloride, sodium chloride  REVIEW OF SYSTEMS:    10 Point review of Systems was done is negative except as noted above.   LABORATORY DATA: . CBC  . CBC Latest Ref Rng 09/03/2015 09/02/2015 09/01/2015  WBC 4.0 - 10.5 K/uL 12.8(H) 15.0(H) 17.9(H)  Hemoglobin 13.0 - 17.0 g/dL 9.3(L) 9.1(L) 9.4(L)  Hematocrit 39.0 - 52.0 % 28.3(L) 27.8(L) 28.5(L)  Platelets 150 - 400 K/uL 312 327 310    . CMP Latest Ref Rng 09/03/2015 09/02/2015 09/01/2015  Glucose 65 - 99 mg/dL 93 98 112(H)  BUN 6 - 20 mg/dL 19 25(H) 31(H)  Creatinine 0.61 - 1.24 mg/dL 0.55(L) 0.54(L) 0.57(L)  Sodium 135 - 145 mmol/L 143 144 145  Potassium 3.5 - 5.1 mmol/L 3.5 3.3(L) 3.0(L)  Chloride 101 - 111 mmol/L 111 109 107  CO2 22 - 32 mmol/L _1 Calcium 8.9 - 10.3 mg/dL 8.0(L) 8.5(L) 8.5(L)  Total Protein 6.5 - 8.1 g/dL 4.9(L) 5.3(L) 5.3(L)  Total Bilirubin 0.3 - 1.2 mg/dL 0.5 0.9 0.8  Alkaline Phos 38 - 126 U/L 174(H) 176(H) 110  AST 15 - 41 U/L 129(H) 252(H) 75(H)  ALT 17 - 63 U/L 234(H) 265(H) 121(H)     RADIOGRAPHIC STUDIES:ASSESSMENT & PLAN:   #1  Diffuse large B-cell lymphoma stage IV AE with involvement of mesenteric, inguinal lymph nodes and C5 vertebral body with spinal cord impingement.  He had a C5 corpectomy on 07/26/2015 that showed diffuse large B-cell lymphoma with a Ki-67 ranging from 20 to 90%.    DLBCL (diffuse large B cell lymphoma)   06/21/2015 Imaging CT Chest/abd/pelvis: IMPRESSION: 5 mm nodule within the right lower lobe as described.  Pneumobilia consistent with a prior sphincterotomy.  No acute abnormality is identified. No findings to suggest chest etiology for the known lesion at C5 are seen.   06/21/2015 Imaging MRI c spine1. Diffuse marrow replacement of the C5  vertebral body highly concerning for neoplasm (metastatic disease, myeloma, or lymphoma). Epidural tumor at this level results in moderate spinal stenosis with moderate impression on the spinal cord    07/10/2015 PET scan 1. The previously demonstrated abnormal C5 vertebral body is hypermetabolic. No other osseous lesions demonstrated. 2. There are small hypermetabolic lymph nodes within the left mesentery and both inguinal regions.    07/26/2015 Initial Biopsy Had a C5 corpectomy pathology showed diffuse large B-cell lymphoma   08/08/2015 -  Chemotherapy Started for cycle of R CHOP.    08/09/2015 -  Chemotherapy Received first cycle of intrathecal methotrexate plus hydrocortisone for CNS prophylaxis.   He is currently s/p 1 st cycle of R-CHOP on 08/08/2015 and was scheduled for 2nd cycle of R-CHOP on 08/31/2015.   Plan Aggressive input by therapies Please try to get patient to recliner with the help of lift if needed. -wound care for sacral decubitus ulcer _TCU on discharge. Active therapies. -He was due for cycle 2 on 08/31/2015. Will preferably need to avoid >7-10 day delay in cycle 2. Will need TCU that will be able to transport him to cancer center for chemotherapy and then weekly visits. -I will schedule him for clinic follow up and 2nd cycle of chemotherapy on 09/11/2015. No Neulasta. Will plan to use R-mini CHOP -added neulasta to allergy list. -PLZ consult neurosurgery Dr Ronnald Ramp on Monday 9/12 to evaluate post-operative status of c-spine now that patient is 5 weeks out from surgery to determine if he can discontinue hard c-collar since that would help with swallowing and movements.  #2 ARDS with fevers and bilateral groundglass pulmonary opacities.Resolved. Noncardiogenic pulmonary edema normal BNP. Mild troponin leak due to demand ischemia which normalized. Neulasta related ARDS versus HCAP.  Lungs much improved patient has been extubated , afebrile, out of the MICU 09/01/2015 on room air. Blood  cultures negative thus far Viral respiratory panel negative Bronchoalveolar lavage with no overt organisms seen on Gram stain. PCP negative. CMV IgG positive IgM negative PCR qualitative positive. Quantitative PCR<200 Elevated sedimentation rate and CRP now showing improvement which is a good sign. Urine testing negative for Legionella antigen and Streptococcus pneumoniae. Repeat BAL showed Candida which is unlikely incidental organism.  #3 hypoxic respiratory failure due to #2 much improved now also went and with decreasing oxygen requirements.  #4 Anemia related to chemotherapy and lymphoma as well as possible sepsis. #5  leukocytosis likely due to Neulasta and steroids and less likely due to ongoing sepsis . #6 ICU delirium - meds/intubation/steroids/infection -now resolved. #7 protein calorie malnutrition #8 significant muscle wasting due to immobilization, steroids, nutritional limitations,, neoplasm. #9 symptomatically anemia due to chemotherapy plus multiple blood draws plus poor bone marrow reserve. #10 hypokalemia #11 sacral decubitus ulcer #12 Elevated transaminases and Alkaline Phosphatase- likely related to Ceftazidime. Now off Abx from yesterday. Improving. [place] bad  with no evidence of gall bladder pathology.  Patient was not getting cymbalta and abilify and these have been discontinued. Likely likely acalculous cholecystitis or biliary sludge. No abdominal pain/tenderness.  Plan -We will have to avoid using any G-CSF in the future. -getting [place] feeding temporarily. Very keen to eat orally. Swallow evaluation in progress. Consult neurosurgery (Dr Ronnald Ramp) about possibly discontinuing the hard c-collar-- will help with swallowing. -only irradiated blood products for transfusion. -will continue to follow up daily. #10 Delirium -resolved. In good spirits #11 h/o MDD Plan -re-orienting activities. -active engagement in goal oriented therapies as much as possible would be most  therapeutic. -off steroids and minimize other sedatives as much as possible. -maintain sleep/wakeful cycles.-off cymbalta and abilify since he could not take them orally.  -minimize hepatotoxins in the setting of elevated LFts  Will continue to follow.    Sullivan Lone MD [place] AAHIVMS [facility] Hematology/Oncology Physician [place]  (Office):       [phone] (Work cell):  [phone] (Fax):           [phone]

## 2015-09-03 NOTE — Clinical Social Work Note (Signed)
CSW attempted to obtain PASARR.  CSW put information in [facility] MUST system and received message stating that "nurse needed to review".  This will take place tomorrow.  Week day CSW to follow up to see if  PASARR will be provided after review and /or what additional information may need to be submitted.  Dede Query, LCSW [place] Worker - Weekend Coverage cell #: [phone]

## 2015-09-04 ENCOUNTER — Telehealth: Payer: Self-pay | Admitting: Hematology

## 2015-09-04 ENCOUNTER — Inpatient Hospital Stay (HOSPITAL_COMMUNITY): Payer: Non-veteran care

## 2015-09-04 LAB — COMPREHENSIVE METABOLIC PANEL
ALBUMIN: 2.1 g/dL — AB (ref 3.5–5.0)
ALK PHOS: 167 U/L — AB (ref 38–126)
ALT: 195 U/L — AB (ref 17–63)
ANION GAP: 7 (ref 5–15)
AST: 77 U/L — ABNORMAL HIGH (ref 15–41)
BUN: 17 mg/dL (ref 6–20)
CALCIUM: 8.2 mg/dL — AB (ref 8.9–10.3)
CHLORIDE: 110 mmol/L (ref 101–111)
CO2: 26 mmol/L (ref 22–32)
CREATININE: 0.5 mg/dL — AB (ref 0.61–1.24)
GFR calc Af Amer: >60 mL/min (ref >60)
GFR calc non Af Amer: >60 mL/min (ref >60)
GLUCOSE: 105 mg/dL — AB (ref 65–99)
Potassium: 3.8 mmol/L (ref 3.5–5.1)
SODIUM: 143 mmol/L (ref 135–145)
Total Bilirubin: 0.5 mg/dL (ref 0.3–1.2)
Total Protein: 5 g/dL — ABNORMAL LOW (ref 6.5–8.1)

## 2015-09-04 LAB — GLUCOSE, CAPILLARY
GLUCOSE-CAPILLARY: 116 mg/dL — AB (ref 65–99)
GLUCOSE-CAPILLARY: 107 mg/dL — AB (ref 65–99)
GLUCOSE-CAPILLARY: 119 mg/dL — AB (ref 65–99)
GLUCOSE-CAPILLARY: 125 mg/dL — AB (ref 65–99)
Glucose-Capillary: 110 mg/dL — ABNORMAL HIGH (ref 65–99)

## 2015-09-04 LAB — GAMMA GT: GGT: 197 U/L — ABNORMAL HIGH (ref 7–50)

## 2015-09-04 MED ORDER — PANTOPRAZOLE SODIUM 40 MG IV SOLR
40.0000 mg | INTRAVENOUS | Status: DC
  Administered 2015-09-04 – 2015-09-05 (×2): 40 mg via INTRAVENOUS
  Filled 2015-09-04 (×2): qty 40

## 2015-09-04 NOTE — Progress Notes (Signed)
Ethan Stewart   HEMATOLOGY/ONCOLOGY INPATIENT PROGRESS NOTE   Date of Service: 09/03/2015  Inpatient Attending: .Modena Jansky, MD   SUBJECTIVE  Ethan Stewart was seen this afternoon. He cleared swallow evaluation and was started on dysphagia diet and NJ tube removed. He had a C-spine X-ray which showed good c-spine alignment and his C-collar was discontinued as per neurosurgery recommendations. He is in good spirits and glad that he will be going to the rehab place of his choice tomorrow. LFts continue to improve. Improving physical activity level. No other acute new symptoms.    OBJECTIVE:  PHYSICAL EXAMINATION: . Filed Vitals:   09/04/15 0410 09/04/15 0421 09/04/15 1304 09/04/15 2047  BP:  138/70 119/68 119/59  Pulse:  84 102 85  Temp:  98.4 F (36.9 C) 98.5 F (36.9 C) 98.5 F (36.9 C)  TempSrc:  Axillary Oral Oral  Resp:  '20 19 18  ' Height:      Weight: 196 lb 11.2 oz (89.223 kg)     SpO2:  96% 97% 98%   GENERAL: awake, breathing comfortably, in good spirits. SKIN: no acute rashes EYES: PERLA OROPHARYNX: no exudates NECK: no JVD, C- collar discontinued LYMPH:  no palpable lymphadenopathy in the cervical, axillary LUNGS:CTA b/l HEART: regular rate & rhythm,  no murmurs and bilateral lower extremity 1+ edema. ABDOMEN: abdomen soft, non-tender, normoactive bowel sounds  NEURO: alert , oriented x 3, baseline RUE weakness,  Overall generalized weakness. PSYCH: More positive today. No suicidal or homicidal ideation. No overt delusions. Delirium resolved   ALLERGIES:  is allergic to neulasta.  MEDICATIONS:  Scheduled Meds: . antiseptic oral rinse  7 mL Mouth Rinse q12n4p  . chlorhexidine  15 mL Mouth Rinse BID  . enoxaparin (LOVENOX) injection  40 mg Subcutaneous Q24H  . feeding supplement (PRO-STAT SUGAR FREE 64)  30 mL Oral BID  . pantoprazole (PROTONIX) IV  40 mg Intravenous Q24H  . sodium chloride  10-40 mL Intracatheter Q12H   Continuous Infusions:   PRN  Meds:.acetaminophen **OR** [DISCONTINUED] acetaminophen, albuterol, diazepam, neomycin-bacitracin-polymyxin, [DISCONTINUED] ondansetron **OR** ondansetron (ZOFRAN) IV, RESOURCE THICKENUP CLEAR, sodium chloride, sodium chloride  REVIEW OF SYSTEMS:    10 Point review of Systems was done is negative except as noted above.   LABORATORY DATA: . CBC  . CBC Latest Ref Rng 09/03/2015 09/02/2015 09/01/2015  WBC 4.0 - 10.5 K/uL 12.8(H) 15.0(H) 17.9(H)  Hemoglobin 13.0 - 17.0 g/dL 9.3(L) 9.1(L) 9.4(L)  Hematocrit 39.0 - 52.0 % 28.3(L) 27.8(L) 28.5(L)  Platelets 150 - 400 K/uL 312 327 310    . CMP Latest Ref Rng 09/04/2015 09/03/2015 09/02/2015  Glucose 65 - 99 mg/dL 105(H) 93 98  BUN 6 - 20 mg/dL 17 19 25(H)  Creatinine 0.61 - 1.24 mg/dL 0.50(L) 0.55(L) 0.54(L)  Sodium 135 - 145 mmol/L 143 143 144  Potassium 3.5 - 5.1 mmol/L 3.8 3.5 3.3(L)  Chloride 101 - 111 mmol/L 110 111 109  CO2 22 - 32 mmol/L '26 27 28  ' Calcium 8.9 - 10.3 mg/dL 8.2(L) 8.0(L) 8.5(L)  Total Protein 6.5 - 8.1 g/dL 5.0(L) 4.9(L) 5.3(L)  Total Bilirubin 0.3 - 1.2 mg/dL 0.5 0.5 0.9  Alkaline Phos 38 - 126 U/L 167(H) 174(H) 176(H)  AST 15 - 41 U/L 77(H) 129(H) 252(H)  ALT 17 - 63 U/L 195(H) 234(H) 265(H)     RADIOGRAPHIC STUDIES:ASSESSMENT & PLAN:   #1  Diffuse large B-cell lymphoma stage IV AE with involvement of mesenteric, inguinal lymph nodes and C5 vertebral body with spinal cord impingement.  He had a C5 corpectomy on 07/26/2015 that showed diffuse large B-cell lymphoma with a Ki-67 ranging from 20 to 90%.    DLBCL (diffuse large B cell lymphoma)   06/21/2015 Imaging CT Chest/abd/pelvis: IMPRESSION: 5 mm nodule within the right lower lobe as described.  Pneumobilia consistent with a prior sphincterotomy.  No acute abnormality is identified. No findings to suggest chest etiology for the known lesion at C5 are seen.   06/21/2015 Imaging MRI c spine1. Diffuse marrow replacement of the C5 vertebral body highly concerning for  neoplasm (metastatic disease, myeloma, or lymphoma). Epidural tumor at this level results in moderate spinal stenosis with moderate impression on the spinal cord    07/10/2015 PET scan 1. The previously demonstrated abnormal C5 vertebral body is hypermetabolic. No other osseous lesions demonstrated. 2. There are small hypermetabolic lymph nodes within the left mesentery and both inguinal regions.    07/26/2015 Initial Biopsy Had a C5 corpectomy pathology showed diffuse large B-cell lymphoma   08/08/2015 -  Chemotherapy Started for cycle of R CHOP.    08/09/2015 -  Chemotherapy Received first cycle of intrathecal methotrexate plus hydrocortisone for CNS prophylaxis.   He is currently s/p 1 st cycle of R-CHOP on 08/08/2015 and was scheduled for 2nd cycle of R-CHOP on 08/31/2015.   Plan - followup with me in clinic on 09/11/2015 as per appointment for labs, clinic visit and likely cycle 2 of chemotherapy. -continue nutritional support and Aggressive input by therapies -likely discharge to SNF tomorrow for continued therapies -wound care for sacral decubitus ulcer - No Neulasta. Will plan to use R-mini CHOP -added neulasta to allergy list. -Hard c-collar discontinued as per neurosurgery input to the patients delight.    #2 s/p ARDS with fevers and bilateral groundglass pulmonary opacities.Resolved. Noncardiogenic pulmonary edema normal BNP. Mild troponin leak due to demand ischemia which normalized. Neulasta related ARDS versus HCAP.  Lungs much improved patient has been extubated , afebrile, out of the MICU 09/01/2015 on room air. Blood cultures negative thus far Viral respiratory panel negative Bronchoalveolar lavage with no overt organisms seen on Gram stain. PCP negative. CMV IgG positive IgM negative PCR qualitative positive. Quantitative PCR<200 Elevated sedimentation rate and CRP now showing improvement which is a good sign. Urine testing negative for Legionella antigen and Streptococcus  pneumoniae. Repeat BAL showed Candida which is unlikely incidental organism.  #3 hypoxic respiratory failure due to #2 much improved now also went and with decreasing oxygen requirements.  #4 Anemia related to chemotherapy and lymphoma as well as possible sepsis. #5  leukocytosis likely due to Neulasta and steroids and less likely due to ongoing sepsis . #6 ICU delirium - meds/intubation/steroids/infection -now resolved. #7 protein calorie malnutrition #8 significant muscle wasting due to immobilization, steroids, nutritional limitations,, neoplasm. #9 symptomatically anemia due to chemotherapy plus multiple blood draws plus poor bone marrow reserve. #10 hypokalemia #11 sacral decubitus ulcer #12 Elevated transaminases and Alkaline Phosphatase- likely related to Ceftazidime. Now off Abx from yesterday.Continues to improve. [place] abd with no evidence of gall bladder pathology. #10 Delirium -resolved. In good spirits #11 h/o MDD Plan -re-orienting activities. -active engagement in goal oriented therapies as much as possible would be most therapeutic. -off steroids and minimize other sedatives as much as possible. -maintain sleep/wakeful cycles.-off cymbalta and abilify since he could not take them orally.  -minimize hepatotoxins in the setting of elevated LFts  Patient stable for discharge to SNF for ongoing therapies, wound care and nutritional optimization from oncology standpoint.  Appreciate  excellent care by the hospitalist team.  F/u with me as per his scheduled appointment on 09/11/2015      Ethan Lone MD [place] [facility] Hematology/Oncology Physician [place]  (Office):       [phone] (Work cell):  [phone] (Fax):           [phone]

## 2015-09-04 NOTE — Clinical Social Work Placement (Signed)
CSW provided bed offers to patient & wife, Ethan Stewart at bedside - they have accepted bed at [facility]. Anticipating discharge tomorrow, 9/13.     Raynaldo Opitz, [facility] Clinical Social Worker cell #: [phone]    CLINICAL SOCIAL WORK PLACEMENT  NOTE  Date:  09/04/2015  Patient Details  Name: Ethan Stewart MRN: [id number] Date of Birth: 02-Jun-1946  Clinical Social Work is seeking post-discharge placement for this patient at the [place] level of care (*CSW will initial, date and re-position this form in  chart as items are completed):  Yes   Patient/family provided with [place] Work Department's list of facilities offering this level of care within the geographic area requested by the patient (or if unable, by the patient's family).  Yes   Patient/family informed of their freedom to choose among providers that offer the needed level of care, that participate in Medicare, Medicaid or managed care program needed by the patient, have an available bed and are willing to accept the patient.  Yes   Patient/family informed of [facility]'s ownership interest in [facility] and [facility], as well as of the fact that they are under no obligation to receive care at these facilities.  PASRR submitted to EDS on 09/04/15     PASRR number received on 09/04/15     Existing PASRR number confirmed on       FL2 transmitted to all facilities in geographic area requested by pt/family on 09/04/15     FL2 transmitted to all facilities within larger geographic area on       Patient informed that his/her managed care company has contracts with or will negotiate with certain facilities, including the following:        Yes   Patient/family informed of bed offers received.  Patient chooses bed at [facility]     Physician recommends and patient chooses bed at      Patient to be transferred to [facility] on  .  Patient  to be transferred to facility by       Patient family notified on   of transfer.  Name of family member notified:        PHYSICIAN       Additional Comment:    _______________________________________________ Standley Brooking, LCSW 09/04/2015, 1:31 PM

## 2015-09-04 NOTE — Progress Notes (Signed)
Psych MD and CSW rounded on patient together. Patient appears more alert and appropriate. Patient expressed that he has been feeling better than before, stating "this has been a good day". Patient also expressed to MD and CSW that he has been eating well. Patient has been working with physical therapy, and is hopeful to receive short term rehab at [facility]. No further concerns reported by patient at this time.   Lucius Conn, [place] Social Worker [place] [phone]

## 2015-09-04 NOTE — Progress Notes (Signed)
Assessment / Plan / Recommendation CHL IP CLINICAL IMPRESSIONS 09/04/2015  Therapy Diagnosis Moderate pharyngeal phase dysphagia  Clinical Impression Moderate pharyngeal dysphagia with ongoing pharyngeal residuals with improved clearance since prior MBS last Thursday 08/31/15.  Moderate pharyngeal residuals continues (vallecular more than pyriform sinus) - worse with solids than liquids - due to decreased tongue base retraction epiglottic deflection, and laryngeal elevation.  Pt conducting dry swallows, cough, expectoration during evaluation to compensate and aid clearance with verbal cue.  He continues to be sensory impaired to residuals unfortunately.  Trace aspiration of thin via straw noted without pt sensation.  SLP ?s if dobhoff impacted pharyngeal clearance further due to narrowed pharynx and epiglottis contacting tube.    Pt has been conducting swallowing exercises consistently over the weekend and has demonstrated improved phonatory and swallow ability.  Recommend consider to dc Dobhoff and initiate diet with strict precautions.    Folllow up SLP indicated for dysphagia management to furhter strengthen pharyngeal swallow musculature.  Using live monitor SLP educated pt/spouse to findings and reinforced effective compensation strategies.  Will continue to follow for management.  MD paged at [place] with recommendations.         CHL IP TREATMENT RECOMMENDATION 09/04/2015  Treatment Recommendations Therapy as outlined in treatment plan below     CHL IP DIET RECOMMENDATION 09/04/2015  SLP Diet Recommendations Dysphagia 3 (Mech soft);Thin  Liquid Administration via Thin, prefer water, no straws  Medication Administration Crushed with puree  Compensations Slow rate;Small sips/bites;Multiple dry swallows after each bite/sip;Follow solids with liquid;Hard cough after swallow;Effortful swallow, start intake with water  Postural Changes and/or Swallow Maneuvers Upright 90* and for 30 min after meals      CHL IP OTHER RECOMMENDATIONS 09/04/2015  Recommended Consults (None)  Oral Care Recommendations Oral care BID  Other Recommendations Have oral suction available      Luanna Salk, [place] [facility] SLP [phone]

## 2015-09-04 NOTE — Consult Note (Signed)
[facility] Face-to-Face Psychiatry Consult follow-up  Reason for Consult:  depression Referring Physician:  Dr. Elsworth Soho Patient Identification: Ethan Stewart MRN:  [id number] Principal Diagnosis: MDD (major depressive disorder), recurrent severe, without psychosis Diagnosis:   Patient Active Problem List   Diagnosis Date Noted  . Elevated LFTs [R79.89] 09/02/2015  . Abnormal liver function tests [R79.89]   . Acute respiratory failure with hypoxemia [J96.01]   . Hypokalemia [E87.6]   . Dysphagia [R13.10]   . MDD (major depressive disorder), recurrent severe, without psychosis [F33.2] 08/31/2015  . Dysphagia, pharyngoesophageal phase [R13.14]   . Protein-calorie malnutrition, severe [E43]   . Pressure ulcer [L89.90] 08/30/2015  . Acute respiratory failure [J96.00]   . HCAP (healthcare-associated pneumonia) [J18.9] 08/21/2015  . Sepsis [A41.9] 08/21/2015  . Anemia associated with chemotherapy [D64.81, T45.1X5A] 08/21/2015  . Sinus tachycardia [I47.1] 08/21/2015  . GERD (gastroesophageal reflux disease) [K21.9] 08/21/2015  . Blood poisoning [A41.9]   . Hypoxia [R09.02]   . Acute on chronic respiratory failure with hypoxia [J96.21]   . ARDS (adult respiratory distress syndrome) [J80]   . S/P cervical spinal fusion [Z98.1] 07/26/2015  . DLBCL (diffuse large B cell lymphoma) [C83.30] 07/07/2015  . Neck pain, acute [M54.2] 07/04/2015  . Cervical spine tumor [D49.2] 07/03/2015  . Bone lesion [M89.9] 06/30/2015    Total Time spent with patient: 30 minutes  Subjective:   Ethan Stewart is a 69 y.o. male patient admitted with depression and paranoia.  HPI:  Ethan Stewart is a 69 y.o. male seen in [place] the intensive care unit face-to-face for psychiatric consultation along with psychiatric social service for increased symptoms of depression and also paranoia. Information for this evaluation obtained from the available medical records and also case discussed with the patient wife who is  at the room and also has some family members. Patient is awake, alert and oriented to time place person but has poor insight and judgment. Patient reportedly has a history of depression 25 years ago and required hospitalized. Patient is currently suffering with diffuse Large B-Cell Lymphoma diagnosed 07/2015 on [facility] Chemo Rx last given on 08/09/2015. Patient presented with multiple somatic symptoms and questionable reaction to some of the medication given to him. He was also diagnosed with pneumonia and  elevated lactic Acid level .  Reportedly patient has sepsis and required intubation which he was extubated 2 days ago. Patient currently feeling sad, depressed, restless, disturbed sleep and appetite and low self-esteem. Patient believes he need to get out of the bed and start walking with a walker and then go to home which he [facility]. Patient also feels like his wife and son can care for him at home. Reportedly patient has a difficulty trusting the hospital doctors. Patient believes the medication or treatment is not working for him. There is a question about possible delirium and also he is quite different from his baseline functioning. Patient denies suicidal/homicidal ideation, intention or plans. Patient has no evidence of auditory or visual hallucinations. Patient is willing to take medication to help him for depression, insomnia and generalized body pains secondary to lymphoma.   Interval history: Patient seen for psychiatric consultation follow-up. Patient appeared lying in his bed, calm and cooperative. Patient is awake, alert and oriented to time place person and situation. Patient reported he is feeling much better since he recovered from stress and frustration. Patient also stated most of his emotional state is secondary to treatment, chemotherapy he has received. Patient is hoping  to be transferred to the rehabilitation facility soon and looking forward to work with staff over there.  Patient has several friends came to visit him yesterday. Patient denies current symptoms of acute depression, anxiety, psychosis including paranoid ideations.  Past Medical History:  Past Medical History  Diagnosis Date  . History of bleeding ulcers 1990's  . 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 Anxiety   . GERD (gastroesophageal reflux disease)   . H. pylori infection   . History of blood transfusion   . Bone cancer      tumor on C5  . Arm numbness 08/07/15    Limited movement due to tumor on spine  . Diffuse large B cell lymphoma     biposy 07/25/2105    Past Surgical History  Procedure Laterality Date  . Hernia repair Bilateral     with mesh  . Colonoscopy    . Eye surgery Right     catheter  . Anterior cervical corpectomy N/A 07/26/2015    Procedure: Cervical five Corpectomy/Cervical four-six Fusion/Plate ;  Surgeon: Eustace Moore, MD;  Location: [place];  Service: Neurosurgery;  Laterality: N/A;  C5 Corpectomy/C4-6 Fusion/Plate C4-6   Family History:  Family History  Problem Relation Age of Onset  . Hypertension Mother    Social History:  History  Alcohol Use  . 3.0 oz/week  . 2 Glasses of wine, 3 Cans of beer per week     History  Drug Use No    Social History   Social History  . Marital Status: Married    Spouse Name: N/A  . Number of Children: N/A  . Years of Education: N/A   Social History Main Topics  . Smoking status: Former Smoker -- 1.00 packs/day for 10 years    Types: Cigarettes    Quit date: 12/23/1981  . Smokeless tobacco: Never Used  . Alcohol Use: 3.0 oz/week    2 Glasses of wine, 3 Cans of beer per week  . Drug Use: No  . Sexual Activity: Yes   Other Topics Concern  . None   Social History Narrative   Additional Social History:                          Allergies:   Allergies  Allergen Reactions  . Neulasta [Pegfilgrastim] Other (See Comments)    ARDS likely related to Neulasta    Labs:  Results for orders placed  or performed during the hospital encounter of 08/20/15 (from the past 48 hour(s))  Glucose, capillary     Status: Abnormal   Collection Time: 09/02/15  5:06 PM  Result Value Ref Range   Glucose-Capillary 127 (H) 65 - 99 mg/dL   Comment 1 Notify RN   Glucose, capillary     Status: Abnormal   Collection Time: 09/02/15  8:24 PM  Result Value Ref Range   Glucose-Capillary 119 (H) 65 - 99 mg/dL   Comment 1 Notify RN    Comment 2 Document in Chart   Glucose, capillary     Status: Abnormal   Collection Time: 09/03/15 12:02 AM  Result Value Ref Range   Glucose-Capillary 127 (H) 65 - 99 mg/dL   Comment 1 Notify RN    Comment 2 Document in Chart   Glucose, capillary     Status: None   Collection Time: 09/03/15  4:14 AM  Result Value Ref Range   Glucose-Capillary 84 65 - 99 mg/dL   Comment  1 Notify RN    Comment 2 Document in Chart   Comprehensive metabolic panel     Status: Abnormal   Collection Time: 09/03/15  6:25 AM  Result Value Ref Range   Sodium 143 135 - 145 mmol/L   Potassium 3.5 3.5 - 5.1 mmol/L   Chloride 111 101 - 111 mmol/L   CO2 27 22 - 32 mmol/L   Glucose, Bld 93 65 - 99 mg/dL   BUN 19 6 - 20 mg/dL   Creatinine, Ser 0.55 (L) 0.61 - 1.24 mg/dL   Calcium 8.0 (L) 8.9 - 10.3 mg/dL   Total Protein 4.9 (L) 6.5 - 8.1 g/dL   Albumin 2.0 (L) 3.5 - 5.0 g/dL   AST 129 (H) 15 - 41 U/L   ALT 234 (H) 17 - 63 U/L   Alkaline Phosphatase 174 (H) 38 - 126 U/L   Total Bilirubin 0.5 0.3 - 1.2 mg/dL   GFR calc non Af Amer >60 >60 mL/min   GFR calc Af Amer >60 >60 mL/min    Comment: (NOTE) The eGFR has been calculated using the CKD EPI equation. This calculation has not been validated in all clinical situations. eGFR's persistently <60 mL/min signify possible Chronic Kidney Disease.    Anion gap 5 5 - 15  Magnesium     Status: None   Collection Time: 09/03/15  6:25 AM  Result Value Ref Range   Magnesium 2.2 1.7 - 2.4 mg/dL  CBC with Differential/Platelet     Status: Abnormal    Collection Time: 09/03/15  6:25 AM  Result Value Ref Range   WBC 12.8 (H) 4.0 - 10.5 K/uL   RBC 3.20 (L) 4.22 - 5.81 MIL/uL   Hemoglobin 9.3 (L) 13.0 - 17.0 g/dL   HCT 28.3 (L) 39.0 - 52.0 %   MCV 88.4 78.0 - 100.0 fL   MCH 29.1 26.0 - 34.0 pg   MCHC 32.9 30.0 - 36.0 g/dL   RDW 17.9 (H) 11.5 - 15.5 %   Platelets 312 150 - 400 K/uL   Neutrophils Relative % 86 (H) 43 - 77 %   Neutro Abs 10.9 (H) 1.7 - 7.7 K/uL   Lymphocytes Relative 5 (L) 12 - 46 %   Lymphs Abs 0.7 0.7 - 4.0 K/uL   Monocytes Relative 8 3 - 12 %   Monocytes Absolute 1.1 (H) 0.1 - 1.0 K/uL   Eosinophils Relative 1 0 - 5 %   Eosinophils Absolute 0.1 0.0 - 0.7 K/uL   Basophils Relative 0 0 - 1 %   Basophils Absolute 0.0 0.0 - 0.1 K/uL  Gamma GT     Status: Abnormal   Collection Time: 09/03/15  6:25 AM  Result Value Ref Range   GGT 197 (H) 7 - 50 U/L    Comment: Performed at [facility]  Glucose, capillary     Status: Abnormal   Collection Time: 09/03/15  7:34 AM  Result Value Ref Range   Glucose-Capillary 100 (H) 65 - 99 mg/dL  Glucose, capillary     Status: Abnormal   Collection Time: 09/03/15 11:39 AM  Result Value Ref Range   Glucose-Capillary 101 (H) 65 - 99 mg/dL  Glucose, capillary     Status: Abnormal   Collection Time: 09/03/15  4:32 PM  Result Value Ref Range   Glucose-Capillary 119 (H) 65 - 99 mg/dL  Glucose, capillary     Status: Abnormal   Collection Time: 09/03/15  8:04 PM  Result Value Ref Range  Glucose-Capillary 113 (H) 65 - 99 mg/dL  Glucose, capillary     Status: Abnormal   Collection Time: 09/03/15 11:57 PM  Result Value Ref Range   Glucose-Capillary 110 (H) 65 - 99 mg/dL  Glucose, capillary     Status: Abnormal   Collection Time: 09/04/15  4:06 AM  Result Value Ref Range   Glucose-Capillary 116 (H) 65 - 99 mg/dL  Comprehensive metabolic panel     Status: Abnormal   Collection Time: 09/04/15  6:33 AM  Result Value Ref Range   Sodium 143 135 - 145 mmol/L   Potassium 3.8 3.5 -  5.1 mmol/L   Chloride 110 101 - 111 mmol/L   CO2 26 22 - 32 mmol/L   Glucose, Bld 105 (H) 65 - 99 mg/dL   BUN 17 6 - 20 mg/dL   Creatinine, Ser 0.50 (L) 0.61 - 1.24 mg/dL   Calcium 8.2 (L) 8.9 - 10.3 mg/dL   Total Protein 5.0 (L) 6.5 - 8.1 g/dL   Albumin 2.1 (L) 3.5 - 5.0 g/dL   AST 77 (H) 15 - 41 U/L   ALT 195 (H) 17 - 63 U/L   Alkaline Phosphatase 167 (H) 38 - 126 U/L   Total Bilirubin 0.5 0.3 - 1.2 mg/dL   GFR calc non Af Amer >60 >60 mL/min   GFR calc Af Amer >60 >60 mL/min    Comment: (NOTE) The eGFR has been calculated using the CKD EPI equation. This calculation has not been validated in all clinical situations. eGFR's persistently <60 mL/min signify possible Chronic Kidney Disease.    Anion gap 7 5 - 15  Glucose, capillary     Status: Abnormal   Collection Time: 09/04/15  7:33 AM  Result Value Ref Range   Glucose-Capillary 107 (H) 65 - 99 mg/dL  Glucose, capillary     Status: Abnormal   Collection Time: 09/04/15 12:23 PM  Result Value Ref Range   Glucose-Capillary 119 (H) 65 - 99 mg/dL    Vitals: Blood pressure 119/68, pulse 102, temperature 98.5 F (36.9 C), temperature source Oral, resp. rate 19, height 6' 1" (1.854 m), weight 89.223 kg (196 lb 11.2 oz), SpO2 97 %.  Risk to Self: Is patient at risk for suicide?: No Risk to Others:   Prior Inpatient Therapy:   Prior Outpatient Therapy:    Current Facility-Administered Medications  Medication Dose Route Frequency Provider Last Rate Last Dose  . acetaminophen (TYLENOL) tablet 650 mg  650 mg Oral Q6H PRN Theressa Millard, MD   650 mg at 08/30/15 2017  . albuterol (PROVENTIL) (2.5 MG/3ML) 0.083% nebulizer solution 2.5 mg  2.5 mg Nebulization Q4H PRN Kara Mead V, MD   2.5 mg at 08/30/15 0838  . antiseptic oral rinse (CPC / CETYLPYRIDINIUM CHLORIDE 0.05%) solution 7 mL  7 mL Mouth Rinse q12n4p Kara Mead V, MD   7 mL at 09/04/15 1310  . chlorhexidine (PERIDEX) 0.12 % solution 15 mL  15 mL Mouth Rinse BID Kara Mead V, MD   15 mL at 09/04/15 1049  . diazepam (VALIUM) 1 MG/ML solution 5 mg  5 mg Per Tube Q8H PRN Kara Mead V, MD      . enoxaparin (LOVENOX) injection 40 mg  40 mg Subcutaneous Q24H Theressa Millard, MD   40 mg at 09/04/15 1049  . feeding supplement (PRO-STAT SUGAR FREE 64) liquid 30 mL  30 mL Oral BID Maricela Bo Ostheim, RD   30 mL at 09/04/15 1049  .  neomycin-bacitracin-polymyxin (NEOSPORIN) ointment 1 application  1 application Topical PRN Theressa Millard, MD      . ondansetron (ZOFRAN) injection 4 mg  4 mg Intravenous Q6H PRN Theressa Millard, MD      . pantoprazole (PROTONIX) injection 40 mg  40 mg Intravenous Q24H Modena Jansky, MD   40 mg at 09/04/15 1137  . [place]   Oral PRN Kara Mead V, MD      . sodium chloride 0.9 % injection 10 mL  10 mL Intracatheter PRN Brunetta Genera, MD      . sodium chloride 0.9 % injection 10-40 mL  10-40 mL Intracatheter Q12H Rigoberto Noel, MD   10 mL at 09/03/15 0908  . sodium chloride 0.9 % injection 10-40 mL  10-40 mL Intracatheter PRN Rigoberto Noel, MD   10 mL at 09/03/15 2101    Musculoskeletal: Strength & Muscle Tone: decreased Gait & Station: unable to stand Patient leans: N/A  Psychiatric Specialty Exam: Physical Exam  ROS   Blood pressure 119/68, pulse 102, temperature 98.5 F (36.9 C), temperature source Oral, resp. rate 19, height 6' 1" (1.854 m), weight 89.223 kg (196 lb 11.2 oz), SpO2 97 %.Body mass index is 25.96 kg/(m^2).  General Appearance: Guarded  Eye Contact::  Good  Speech:  Clear and Coherent and Slow  Volume:  Normal  Mood:  Depressed  Affect:  Constricted and Depressed  Thought Process:  Coherent and Goal Directed  Orientation:  Full (Time, Place, and Person)  Thought Content:  Negative  Suicidal Thoughts:  No  Homicidal Thoughts:  No  Memory:  Immediate;   Fair Recent;   Fair  Judgement:  Impaired  Insight:  Lacking  Psychomotor Activity:  Decreased  Concentration:  Fair   Recall:  [employer] of Knowledge:Good  Language: Good  Akathisia:  Negative  Handed:  Right  AIMS (if indicated):     Assets:  Communication Skills Desire for Improvement Financial Resources/Insurance Housing Intimacy Leisure Time Resilience Social Support Talents/Skills Transportation  ADL's:  Impaired  Cognition: WNL  Sleep:      Medical Decision Making: New problem, with additional work up planned, Review of Psycho-Social Stressors (1), Review or order clinical lab tests (1), Review of Last Therapy Session (1), Review or order medicine tests (1), Review of Medication Regimen & Side Effects (2) and Review of New Medication or Change in Dosage (2)  Treatment Plan Summary: Patient has made progress in his symptoms of depression, generalized weakness, restlessness, disturbed sleep and appetite. Patient has severe medical problems and complications of treatment. Patient has  no acute symptoms of depression, anxiety, psychosis and delirium at this time Daily contact with patient to assess and evaluate symptoms and progress in treatment and Medication management  Plan:  Patient has been doing much better and optimistic about rehab placement Patient does not meet criteria for psychiatric inpatient admission. Supportive therapy provided about ongoing stressors.  Appreciate psychiatric consultation awe sign off at this time Please contact [phone] or [phone] if needs further assistance  Disposition: Patient benefit from skilled nursing facility with rehabilitation service.   ,JANARDHAHA R. 09/04/2015 3:32 PM

## 2015-09-04 NOTE — Telephone Encounter (Signed)
Cancelled and scheduled appointments according to 9/10 pof. Left message for patient with next appointment for 9/15 and that all appointments prior to 9/19 have been cancelled. schedule mailed.

## 2015-09-04 NOTE — Progress Notes (Signed)
Nutrition Follow-up  DOCUMENTATION CODES:   Not applicable  INTERVENTION:  - Continue Dysphagia 3 with thin liquids - RD will continue to monitor for needs  NUTRITION DIAGNOSIS:   Increased nutrient needs related to catabolic illness, cancer and cancer related treatments as evidenced by estimated needs.  GOAL:   Patient will meet greater than or equal to 90% of their needs  MONITOR:   Weight trends, PO intake, Labs, I & O's  ASSESSMENT:   69 y.o. male with a history of Diffuse Large B-Cell Lymphoma diagnosed 07/2015 on [facility] Chemo Rx last given on 08/09/2015 who presents to the ED with complaints of fevers and chills and cough with SOB x 2 days. He was evaluated in the ED and on chest X-ray was found to have pneumonia and an elevated lactic Acid level . A Sepsis/HCAP workup was initiated and he was placed on IV Vancomycin and Zosyn and referred for admission.   NGT has been removed and TF stopped. MBS completed earlier today and diet advanced per SLP recommendations.  Wife feeding pt lunch at time of RD visit. Pt had completed ~75% of lunch tray at that time and was continuing to eat. He states that he has a great appetite and everything tastes wonderful after not being able to eat for the past 2 weeks. He denies any chewing or swallowing difficulties.  Per rounds this AM, pt likely to d/c tomorrow.  Beginning to meet needs. Medications reviewed. Labs reviewed; CBGs: 84-127 mg/dL, creatinine low, Ca: 8.2 mg/dL.   Diet Order:  DIET DYS 3 Room service appropriate?: Yes with Assist; Fluid consistency:: Thin  Skin:  Reviewed, no issues  Last BM:  9/11  Height:   Ht Readings from Last 1 Encounters:  09/01/15 6\' 1"  (1.854 m)    Weight:   Wt Readings from Last 1 Encounters:  09/04/15 196 lb 11.2 oz (89.223 kg)    Ideal Body Weight:  83.64 kg (kg)  BMI:  Body mass index is 25.96 kg/(m^2).  Estimated Nutritional Needs:   Kcal:  2150-2400  Protein:  105-115  grams  Fluid:  2L/day  EDUCATION NEEDS:   No education needs identified at this time     Jarome Matin, RD, LDN Inpatient Clinical Dietitian Pager # [phone] After hours/weekend pager # [phone]

## 2015-09-04 NOTE — Progress Notes (Signed)
PROGRESS NOTE    Ethan Stewart JJK:[id number] DOB: July 03, 1946 DOA: 08/20/2015 PCP:  Melinda Crutch, MD  HPI/Brief narrative 69 y.o. M with diffuse large B cell lymphoma, brought to [facility] ED 8/29 with HCAP & ARDS. R-CHOP Chemo Rx last given on 08/09/2015. Status post C5 corpectomy on 8/3. Patient worsened and intubated on 8/30. Care was taken over by CCM and transferred back to [facility] on 09/01/15.   Assessment/Plan:  Healthcare associated pneumonia - Has been on multiple antibiotics since 8/29. Infectious disease is following. His initial bronchoscopy did not reveal much airway inflammation or secretions. However he subsequently worsened and repeat bronchoscopy 08/28/15 showed purulent secretions. - As per ID continue vancomycin and Fortaz for 4 days beginning 9/8 -supposed to discontinue tomorrow but some concern regarding elevated LFTs due to [place]. Discussed antibiotic regimen with infectious disease M.D. on call on 9/10 and discontinued all antibiotics and monitor. - Improved and stable  Acute respiratory failure secondary to ARDS & HCAP - Resolved. ETT 8/30 > 9/6 - Patient was on Decadron for possible pneumonitis. Decadron discontinued  Barotrauma with subcutaneous emphysema 9/2 - Intermittent chest x-ray. Pulmonary toilet  Mild troponin leak - Suspect demand ischemia  Hypertension - Controlled  Hypernatremia  - Improved/resolved   Hypokalemia - Replace as needed and follow  GERD - PPI  Dysphagia - Failed swallow eval 9/8. Expect to improve. Meanwhile CCM placed Panda tube 9/9 and started tube feeds. Avoid TNA.  - Speech therapy evaluated again 9/12 and recommend dysphagia 3 diet and thin liquids. DC PANDA - After discussing with Dr. Sherley Bounds, neurosurgery c-collar to be discontinued 9/12.  Diffuse large B-cell lymphoma, s/p one cycle of R-CHOP - Oncology follow-up appreciated  Anemia - Stable.   Anxiety - On Valium  Hx C5 tumor - s/p C5 corpectomy with C4-C6  arthrodesis and anterior cervical plating of C4-C6 (Dr. Ronnald Ramp - 07/26/15) - Continue cervical collar. Has follow-up appointment with Dr. Ronnald Ramp on 9/19. - Has had right upper extremity monoparesis post surgery - Discussed with Dr. Sherley Bounds, Neurosurgery on 9/12: Recommended and repeated C-spine lateral x-ray. Dr. Ronnald Ramp reviewed imaging studies which were stable and recommended discontinuing cervical collar.   Critical illness deconditioning/functional quadriplegia - PT and OT with plans for SNF for rehabilitation on discharge.  Depression/paranoia - Psychiatry input appreciated. Patient started on Celexa and Abilify. Appreciated oncology concern regarding Abilify interacting with chemotherapy. Updated psychiatry and await further recommendations. Psychiatric medications have been discontinued. Patient appears to be doing well off medications.  Delirium - resolved.  Hyperglycemia - SSI  Abnormal LFTs - Transaminitis jumped from close 200s to 250s range on 9/10-no obvious GI symptoms. Possibly from TNA-now discontinued versus IV Fortaz-discontinued 9/10. LFTs continu to gradually improve. RUQ ultrasound-no cholecystitis.   DVT prophylaxis: Lovenox  Code Status: Full  Family Communication: Discussed with spouse at bedside Disposition Plan: Transferred to telemetry on 9/9. Possible DC to SNF 9/13    Consultants:  CCM-signed off 9/9   Infectious disease  Oncology  Procedures:  ETT 8/30 > 9/6  LUE PICC line  Panda tube  Antibiotics: Zosyn, start date 8/29 >> 9/5 Azithro, start date 8/29 >> 9/2 Bactrim, start date 8/29 >> 8/31 Vanc 8/29 >> 9/1 Zosyn 8/28 >> 9/5 Vanco 9/5 >>9/10 Ceftazidime 9/5 >>9/10 Subjective: Seen after he returned from speech therapy evaluation. Excited that he is able to eat and Panda tube will be removed. Denies any complaints.   Objective: Filed Vitals:   09/03/15 2007 09/04/15 0000 09/04/15  0410 09/04/15 0421  BP: 139/66 122/62  138/70    Pulse: 84 82  84  Temp: 98 F (36.7 C) 97.7 F (36.5 C)  98.4 F (36.9 C)  TempSrc: Oral Oral  Axillary  Resp: 24 20  20   Height:      Weight:   89.223 kg (196 lb 11.2 oz)   SpO2: 95% 96%  96%    Intake/Output Summary (Last 24 hours) at 09/04/15 1218 Last data filed at 09/04/15 0749  Gross per 24 hour  Intake   1250 ml  Output      0 ml  Net   1250 ml   Filed Weights   09/02/15 0436 09/03/15 0419 09/04/15 0410  Weight: 88.406 kg (194 lb 14.4 oz) 87.454 kg (192 lb 12.8 oz) 89.223 kg (196 lb 11.2 oz)     Exam:  General exam: pleasant middle-aged male sitting up comfortably propped up in bed without distress. Has cervical collar in place.  Respiratory system: reduced breath sounds in the bases but otherwise clear to auscultation. No increased work of breathing. Cardiovascular system: S1 & S2 heard, RRR. No JVD, murmurs, gallops, clicks or pedal edema. Gastrointestinal system: Abdomen is nondistended, soft and nontender. Normal bowel sounds heard. Central nervous system: Alert and oriented. No focal neurological deficits. Extremities: Symmetric 5 x 5 power except right upper extremity with grade 3 x 5 power.   Data Reviewed: Basic Metabolic Panel:  Recent Labs Lab 08/29/15 0310  08/31/15 2341 09/01/15 0543 09/02/15 0448 09/03/15 0625 09/04/15 0633  NA 148*  < > 144 145 144 143 143  K 4.4  < > 2.9* 3.0* 3.3* 3.5 3.8  CL 106  < > 107 107 109 111 110  CO2 37*  < > 30 30 28 27 26   GLUCOSE 142*  < > 152* 112* 98 93 105*  BUN 49*  < > 34* 31* 25* 19 17  CREATININE 0.57*  < > 0.60* 0.57* 0.54* 0.55* 0.50*  CALCIUM 8.4*  < > 8.4* 8.5* 8.5* 8.0* 8.2*  MG 2.5*  --   --  2.3  --  2.2  --   PHOS 3.5  --   --  2.6  --   --   --   < > = values in this interval not displayed. Liver Function Tests:  Recent Labs Lab 09/01/15 0543 09/02/15 0448 09/03/15 0625 09/04/15 0633  AST 75* 252* 129* 77*  ALT 121* 265* 234* 195*  ALKPHOS 110 176* 174* 167*  BILITOT 0.8 0.9 0.5  0.5  PROT 5.3* 5.3* 4.9* 5.0*  ALBUMIN 2.1* 2.2* 2.0* 2.1*   No results for input(s): LIPASE, AMYLASE in the last 168 hours. No results for input(s): AMMONIA in the last 168 hours. CBC:  Recent Labs Lab 08/29/15 0310 08/30/15 0552 08/31/15 0535 09/01/15 0543 09/02/15 0448 09/03/15 0625  WBC 18.1* 18.1* 22.8* 17.9* 15.0* 12.8*  NEUTROABS 16.2*  --   --  15.9*  --  10.9*  HGB 8.7* 7.9* 10.4* 9.4* 9.1* 9.3*  HCT 27.2* 25.1* 31.5* 28.5* 27.8* 28.3*  MCV 93.5 91.6 88.2 88.2 88.8 88.4  PLT 410* 359 358 310 327 312   Cardiac Enzymes: No results for input(s): CKTOTAL, CKMB, CKMBINDEX, TROPONINI in the last 168 hours. BNP (last 3 results) No results for input(s): PROBNP in the last 8760 hours. CBG:  Recent Labs Lab 09/03/15 1632 09/03/15 2004 09/03/15 2357 09/04/15 0406 09/04/15 0733  GLUCAP 119* 113* 110* 116* 107*    Recent  Results (from the past 240 hour(s))  Culture, respiratory (NON-Expectorated)     Status: None   Collection Time: 08/28/15 10:54 AM  Result Value Ref Range Status   Specimen Description TRACHEAL ASPIRATE  Final   Special Requests NONE  Final   Gram Stain   Final    FEW WBC PRESENT,BOTH PMN AND MONONUCLEAR RARE SQUAMOUS EPITHELIAL CELLS PRESENT YEAST Performed at [facility]    Culture   Final    FEW CANDIDA ALBICANS Performed at [facility]    Report Status 08/30/2015 FINAL  Final           Studies: Dg Cervical Spine 1 View  09/04/2015   CLINICAL DATA:  Postoperative, surgical complication, subsequent encounter.  EXAM: DG CERVICAL SPINE - 1 VIEW  COMPARISON:  None.  FINDINGS: Patient status post anterior fusion of C4, C5 and C6 without malalignment. The prevertebral soft tissues are normal.  IMPRESSION: Status post a anterior fusion of cervical spine without malalignment.   Electronically Signed   By: Abelardo Diesel M.D.   On: 09/04/2015 10:56   Dg Swallowing Func-speech Pathology  09/04/2015    Objective Swallowing  Evaluation:    Patient Details  Name: Ethan Stewart MRN: [id number] Date of Birth: Sep 19, 1946  Today's Date: 09/04/2015 Time: SLP Start Time (ACUTE ONLY): 0835-SLP Stop Time (ACUTE ONLY): 0906 SLP Time Calculation (min) (ACUTE ONLY): 31 min  Past Medical History:  Past Medical History  Diagnosis Date  . History of bleeding ulcers 1990's  . 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 Anxiety   . GERD (gastroesophageal reflux disease)   . H. pylori infection   . History of blood transfusion   . Bone cancer      tumor on C5  . Arm numbness 08/07/15    Limited movement due to tumor on spine  . Diffuse large B cell lymphoma     biposy 07/25/2105   Past Surgical History:  Past Surgical History  Procedure Laterality Date  . Hernia repair Bilateral     with mesh  . Colonoscopy    . Eye surgery Right     catheter  . Anterior cervical corpectomy N/A 07/26/2015    Procedure: Cervical five Corpectomy/Cervical four-six Fusion/Plate ;   Surgeon: Eustace Moore, MD;  Location: [place] [facility];  Service:  Neurosurgery;  Laterality: N/A;  C5 Corpectomy/C4-6 Fusion/Plate C4-6   HPI:  Other Pertinent Information: 69 y.o. male with a history of Diffuse Large  B-Cell Lymphoma diagnosed 07/2015 on R-CHOP Chemo Rx, GERD, bone cancer,  cervical spine tumor, bleeding ulcers admitted with complaints of fevers  and chills and cough with SOB x 2 days. Pt underwent s/p C5 corpectomy  with C4-C6 arthrodesis and anterior cervical plating of C4-C6 8/13.  Intubated 8/30-9/6. CXR 9/5 progressive opacification and volume loss in  the right lower lobe concerning for increasing atelectasis versus  developing infiltrate and repeat CXR 9/7 there is subcutaneous air,  primarily on the right, but no pneumothorax appreciable. Patchy airspace  disease in the bases is stable. No new opacity. No change in cardiac  silhouette. RN reported coughing after ice chips and BSE obtained  yesterday with pt being placed on modified diet.   Pt seen to inform  his/wife of this SLP  recommendation for repeat MBS today as he received  tube feeding over the weekend.  Multiple risk factors for silent nature of  dysphagia present.    No Data Recorded  Assessment / Plan / Recommendation  CHL IP CLINICAL IMPRESSIONS 09/04/2015  Therapy Diagnosis Moderate pharyngeal phase dysphagia  Clinical Impression Mild pharyngeal dysphagia with ongoing pharyngeal with  improved clearance since prior MBS last Thursday 08/31/15.  Moderate  pharyngeal residuals continues (vallecular more than pyriform sinus) -  worse with solids than liquids - due to decreased tongue base retraction  epiglottic deflection, and laryngeal elevation.  Pt conducting dry  swallows, cough, expectoration during evaluation to compensate and aid  clearance with verbal cue.  He continues to be sensory impaired to  residuals unfortunately.  Trace aspiration of thin via straw noted without  pt sensation.  SLP ?s if dobhoff impacted pharyngeal clearance further due  to narrowed pharynx and epiglottis contacting tube.    Pt has been conducting swallowing exercises consistently over the weekend  and has demonstrated improved phonatory and swallow ability.  Recommend  consider to dc Dobhoff and initiate diet with strict precautions.  Folllow  up SLP indicated for dysphagia management to furhter strengthen pharyngeal  swallow musculature.  Using live monitor SLP educated pt/spouse to  findings and reinforced effective compensation strategies.  Will continue  to follow for management.  MD paged at [place] with recommendations.         CHL IP TREATMENT RECOMMENDATION 09/04/2015  Treatment Recommendations Therapy as outlined in treatment plan below     CHL IP DIET RECOMMENDATION 09/04/2015  SLP Diet Recommendations Dysphagia 3 (Mech soft);Thin  Liquid Administration via Thin, prefer water, no straws  Medication Administration Crushed with puree  Compensations Slow rate;Small sips/bites;Multiple dry swallows after each  bite/sip;Follow solids with liquid;Hard  cough after swallow;Effortful  swallow, start intake with water  Postural Changes and/or Swallow Maneuvers Upright 90* and for 30 min after  meals     CHL IP OTHER RECOMMENDATIONS 09/04/2015  Recommended Consults (None)  Oral Care Recommendations Oral care BID  Other Recommendations Have oral suction available     CHL IP FOLLOW UP RECOMMENDATIONS 09/01/2015  Follow up Recommendations SNF     CHL IP FREQUENCY AND DURATION 09/04/2015  Speech Therapy Frequency (ACUTE ONLY) min 2x/week  Treatment Duration 1 week     Pertinent Vitals/Pain     SLP Swallow Goals No flowsheet data found.  No flowsheet data found.    CHL IP REASON FOR REFERRAL 09/04/2015  Reason for Referral Objectively evaluate swallowing function     CHL IP ORAL PHASE 09/04/2015  Lips (None)  Tongue (None)  Mucous membranes (None)  Nutritional status (None)  Other (None)  Oxygen therapy (None)  Oral Phase WFL  Oral - Pudding Teaspoon (None)  Oral - Pudding Cup (None)  Oral - Honey Teaspoon (None)  Oral - Honey Cup (None)  Oral - Honey Syringe (None)  Oral - Nectar Teaspoon (None)  Oral - Nectar Cup (None)  Oral - Nectar Straw (None)  Oral - Nectar Syringe (None)  Oral - Ice Chips (None)  Oral - Thin Teaspoon (None)  Oral - Thin Cup (None)  Oral - Thin Straw (None)  Oral - Thin Syringe (None)  Oral - Puree (None)  Oral - Mechanical Soft (None)  Oral - Regular (None)  Oral - Multi-consistency (None)  Oral - Pill (None)  Oral Phase - Comment (None)      CHL IP PHARYNGEAL PHASE 09/04/2015  Pharyngeal Phase (None)  Pharyngeal - Pudding Teaspoon (None)  Penetration/Aspiration details (pudding teaspoon) (None)  Pharyngeal - Pudding Cup (None)  Penetration/Aspiration details (pudding cup) (None)  Pharyngeal - Honey Teaspoon (None)  Penetration/Aspiration details (honey teaspoon) (  None)  Pharyngeal - Honey Cup (None)  Penetration/Aspiration details (honey cup) (None)  Pharyngeal - Honey Syringe (None)  Penetration/Aspiration details (honey syringe) (None)  Pharyngeal -  Nectar Teaspoon (None)  Penetration/Aspiration details (nectar teaspoon) (None)  Pharyngeal - Nectar Cup (None)  Penetration/Aspiration details (nectar cup) (None)  Pharyngeal - Nectar Straw (None)  Penetration/Aspiration details (nectar straw) (None)  Pharyngeal - Nectar Syringe (None)  Penetration/Aspiration details (nectar syringe) (None)  Pharyngeal - Ice Chips (None)  Penetration/Aspiration details (ice chips) (None)  Pharyngeal - Thin Teaspoon (None)  Penetration/Aspiration details (thin teaspoon) (None)  Pharyngeal - Thin Cup (None)  Penetration/Aspiration details (thin cup) (None)  Pharyngeal - Thin Straw (None)  Penetration/Aspiration details (thin straw) (None)  Pharyngeal - Thin Syringe (None)  Penetration/Aspiration details (thin syringe') (None)  Pharyngeal - Puree (None)  Penetration/Aspiration details (puree) (None)  Pharyngeal - Mechanical Soft (None)  Penetration/Aspiration details (mechanical soft) (None)  Pharyngeal - Regular (None)  Penetration/Aspiration details (regular) (None)  Pharyngeal - Multi-consistency (None)  Penetration/Aspiration details (multi-consistency) (None)  Pharyngeal - Pill (None)  Penetration/Aspiration details (pill) (None)  Pharyngeal Comment multiple swallows, following solids with liquids,  cough/throat clear/expectoration helpful      CHL IP CERVICAL ESOPHAGEAL PHASE 09/04/2015  Cervical Esophageal Phase (None)  Pudding Teaspoon (None)  Pudding Cup (None)  Honey Teaspoon (None)  Honey Cup (None)  Honey Straw (None)  Nectar Teaspoon (None)  Nectar Cup (None)  Nectar Straw (None)  Nectar Sippy Cup (None)  Thin Teaspoon (None)  Thin Cup (None)  Thin Straw (None)  Thin Sippy Cup (None)  Cervical Esophageal Comment mildly decreased clearance of liquids into  upper esophagus, improved compared to prior eval    No flowsheet data found.         Luanna Salk, MS [facility] SLP [phone]    US Abdomen Limited Ruq  09/03/2015   CLINICAL DATA:  69 year old male with a history of  transaminitis.  EXAM: US ABDOMEN LIMITED - RIGHT UPPER QUADRANT  COMPARISON:  None.  FINDINGS: Gallbladder:  Fundus of the gallbladder not visualized secondary to bowel gas. Negative sonographic Murphy sign. Visualized gallbladder wall not thickened. Uniformly anechoic fluid within the gallbladder with no stones or sludge identified. No pericholecystic fluid.  Common bile duct:  Diameter: 3 mm  Liver:  No focal lesion identified. Within normal limits in parenchymal echogenicity.  IMPRESSION: Sonographic survey negative for acute cholecystitis.  Signed,  Dulcy Fanny. Earleen Newport, DO  [facility]  [facility]   Electronically Signed   By: Corrie Mckusick D.O.   On: 09/03/2015 11:06        Scheduled Meds: . antiseptic oral rinse  7 mL Mouth Rinse q12n4p  . chlorhexidine  15 mL Mouth Rinse BID  . enoxaparin (LOVENOX) injection  40 mg Subcutaneous Q24H  . feeding supplement (PRO-STAT SUGAR FREE 64)  30 mL Oral BID  . insulin aspart  0-9 Units Subcutaneous 6 times per day  . pantoprazole (PROTONIX) IV  40 mg Intravenous Q24H  . sodium chloride  10-40 mL Intracatheter Q12H   Continuous Infusions:    Principal Problem:   MDD (major depressive disorder), recurrent severe, without psychosis Active Problems:   DLBCL (diffuse large B cell lymphoma)   HCAP (healthcare-associated pneumonia)   Sepsis   Anemia associated with chemotherapy   Sinus tachycardia   GERD (gastroesophageal reflux disease)   Blood poisoning   Hypoxia   Acute on chronic respiratory failure with hypoxia   ARDS (adult respiratory distress syndrome)  Acute respiratory failure   Pressure ulcer   Dysphagia, pharyngoesophageal phase   Protein-calorie malnutrition, severe   Acute respiratory failure with hypoxemia   Hypokalemia   Dysphagia   Elevated LFTs   Abnormal liver function tests    Time spent: 20 minutes.    Vernell Leep, MD, FACP, FHM. [facility] Pager  [phone]  If 7PM-7AM, please contact night-coverage [email or web address] Password [email or web address] 09/04/2015, 12:18 PM    LOS: 14 days

## 2015-09-04 NOTE — Progress Notes (Signed)
Patient ID: Ethan Stewart, male   DOB: [birth year], 69 y.o.   MRN: [id number]         [facility]    Date of Admission:  08/20/2015             Principal Problem:   MDD (major depressive disorder), recurrent severe, without psychosis Active Problems:   DLBCL (diffuse large B cell lymphoma)   HCAP (healthcare-associated pneumonia)   Sepsis   Anemia associated with chemotherapy   Sinus tachycardia   GERD (gastroesophageal reflux disease)   Blood poisoning   Hypoxia   Acute on chronic respiratory failure with hypoxia   ARDS (adult respiratory distress syndrome)   Acute respiratory failure   Pressure ulcer   Dysphagia, pharyngoesophageal phase   Protein-calorie malnutrition, severe   Acute respiratory failure with hypoxemia   Hypokalemia   Dysphagia   Elevated LFTs   Abnormal liver function tests   . antiseptic oral rinse  7 mL Mouth Rinse q12n4p  . chlorhexidine  15 mL Mouth Rinse BID  . enoxaparin (LOVENOX) injection  40 mg Subcutaneous Q24H  . feeding supplement (PRO-STAT SUGAR FREE 64)  30 mL Oral BID  . pantoprazole (PROTONIX) IV  40 mg Intravenous Q24H  . sodium chloride  10-40 mL Intracatheter Q12H   SUBJECTIVE:  He is feeling better.  Review of Systems: Review of systems not obtained due to patient factors.  Past Medical History  Diagnosis Date  . History of bleeding ulcers 1990's  . 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 Anxiety   . GERD (gastroesophageal reflux disease)   . H. pylori infection   . History of blood transfusion   . Bone cancer      tumor on C5  . Arm numbness 08/07/15    Limited movement due to tumor on spine  . Diffuse large B cell lymphoma     biposy 07/25/2105    Social History  Substance Use Topics  . Smoking status: Former Smoker -- 1.00 packs/day for 10 years    Types: Cigarettes    Quit date: 12/23/1981  . Smokeless tobacco: Never Used  . Alcohol Use: 3.0 oz/week    2 Glasses of wine, 3 Cans of beer  per week    Family History  Problem Relation Age of Onset  . Hypertension Mother    Allergies  Allergen Reactions  . Neulasta [Pegfilgrastim] Other (See Comments)    ARDS likely related to Neulasta    OBJECTIVE: Filed Vitals:   09/04/15 0000 09/04/15 0410 09/04/15 0421 09/04/15 1304  BP: 122/62  138/70 119/68  Pulse: 82  84 102  Temp: 97.7 F (36.5 C)  98.4 F (36.9 C) 98.5 F (36.9 C)  TempSrc: Oral  Axillary Oral  Resp: 20  20 19   Height:      Weight:  196 lb 11.2 oz (89.223 kg)    SpO2: 96%  96% 97%   Body mass index is 25.96 kg/(m^2).  General: He is in better spirits Lungs: Clear Cor: Regular S1 and S2 with no murmurs  Lab Results Lab Results  Component Value Date   WBC 12.8* 09/03/2015   HGB 9.3* 09/03/2015   HCT 28.3* 09/03/2015   MCV 88.4 09/03/2015   PLT 312 09/03/2015    Lab Results  Component Value Date   CREATININE 0.50* 09/04/2015   BUN 17 09/04/2015   NA 143 09/04/2015   K 3.8 09/04/2015   CL 110 09/04/2015   CO2 26 09/04/2015  Lab Results  Component Value Date   ALT 195* 09/04/2015   AST 77* 09/04/2015   ALKPHOS 167* 09/04/2015   BILITOT 0.5 09/04/2015     Microbiology: Recent Results (from the past 240 hour(s))  Culture, respiratory (NON-Expectorated)     Status: None   Collection Time: 08/28/15 10:54 AM  Result Value Ref Range Status   Specimen Description TRACHEAL ASPIRATE  Final   Special Requests NONE  Final   Gram Stain   Final    FEW WBC PRESENT,BOTH PMN AND MONONUCLEAR RARE SQUAMOUS EPITHELIAL CELLS PRESENT YEAST Performed at [facility]    Culture   Final    FEW CANDIDA ALBICANS Performed at [facility]    Report Status 08/30/2015 FINAL  Final    ASSESSMENT: He received 13 days of antibiotics including 6 days of vancomycin and ceftazidime for possible healthcare associated pneumonia. His antibiotics were stopped 48 hours ago because of concerns of elevated liver enzymes. He remains afebrile  and shows no clinical evidence of active infection at this time.  PLAN: 1. Continue observation off of antibiotics 2. I will sign off now but please call if we can be of further assistance  Michel Bickers, MD Ms [facility] [place] [phone] pager   [phone] cell 09/04/2015, 4:26 PM

## 2015-09-04 NOTE — Progress Notes (Signed)
Physical Therapy Treatment Patient Details Name: Ethan Stewart MRN: [id number] DOB: 1946-04-18 Today's Date: 09/04/2015    History of Present Illness 69 y.o. M with diffuse large B cell lymphoma, brought to [facility] ED 8/29 with HCAP. Developed worsening hypoxia and respiratory distress, Intubated 8/30-08/29/15. H/O Cervical corpectomy for tumor with residual RUE weakness on 07/26/15.    PT Comments    Pt excited he passed his swallow eval and excited he no longer needs to wear his hard neck brace (D/C'd this am).  Assisted OOB to recliner via stedy.  Pt more able to pull self up and stand longer.  Still very weak but progressing.  Positioned on recliner with 2 pillows separated to suspend sacral.  Performed some B LE te's and recliner push ups.     Follow Up Recommendations  SNF     Equipment Recommendations       Recommendations for Other Services       Precautions / Restrictions Precautions Precautions: Fall Precaution Comments: profound weakness Cervical Brace:  (hard collar brace D/C 09/04/15) Restrictions Other Position/Activity Restrictions:  69 y.o. M with diffuse large B cell lymphoma, brought to [facility] ED 8/29 with HCAP. Developed worsening hypoxia and respiratory distress. Intubated 08/22/15, extubated 9/6.s/p C5 corpectomy for lesion on 07/26/15 with RUE residual weakness, proximal> distal.    Mobility  Bed Mobility Overal bed mobility: Needs Assistance Bed Mobility: Supine to Sit Rolling: Mod assist   Supine to sit: Max assist     General bed mobility comments: with increased time pt was able to scoot B LE's to EOB, roll to his R and push self up using L UE>R UE.  Assist to complete.  Once upright, pt only required MinGuard Assist for static sitting balnce.    Transfers Overall transfer level: Needs assistance   Transfers: Sit to/from Stand Sit to Stand: +2 safety/equipment;+2 physical assistance;Min assist;Mod assist         General transfer comment: sit to stand  from elevated bed to stedy required Min Assist on L and Mod assist on R.  Pt able to stand briefly to secure stedy seat.  Rolled to recliner in stedy.  Pt was able to stand static approx 45 sec then required assist to control decend due to fatigue.  Progressing but still very weak.    Ambulation/Gait             General Gait Details: non amb at this time   Stairs            Wheelchair Mobility    Modified Rankin (Stroke Patients Only)       Balance                                    Cognition Arousal/Alertness: Awake/alert Behavior During Therapy: WFL for tasks assessed/performed Overall Cognitive Status: Within Functional Limits for tasks assessed                      Exercises      General Comments        Pertinent Vitals/Pain Pain Assessment: No/denies pain    Home Living                      Prior Function            PT Goals (current goals can now be found in the care plan section) Progress towards  PT goals: Progressing toward goals    Frequency  Min 3X/week    PT Plan Current plan remains appropriate    Co-evaluation             End of Session Equipment Utilized During Treatment: Gait belt Activity Tolerance: Patient limited by fatigue Patient left: in chair;with call bell/phone within reach;with nursing/sitter in room;with family/visitor present     Time: 2836-6294 PT Time Calculation (min) (ACUTE ONLY): 25 min  Charges:  $Therapeutic Exercise: 8-22 mins $Therapeutic Activity: 8-22 mins                    G Codes:      Rica Koyanagi  PTA [facility]  Acute  Rehab Pager      [phone]

## 2015-09-05 ENCOUNTER — Other Ambulatory Visit: Payer: PPO

## 2015-09-05 ENCOUNTER — Ambulatory Visit: Payer: PPO | Admitting: Hematology

## 2015-09-05 LAB — GLUCOSE, CAPILLARY
GLUCOSE-CAPILLARY: 89 mg/dL (ref 65–99)
Glucose-Capillary: 84 mg/dL (ref 65–99)
Glucose-Capillary: 78 mg/dL (ref 65–99)
Glucose-Capillary: 105 mg/dL — ABNORMAL HIGH (ref 65–99)
Glucose-Capillary: 82 mg/dL (ref 65–99)

## 2015-09-05 MED ORDER — RESOURCE THICKENUP CLEAR PO POWD: ORAL | Status: DC

## 2015-09-05 MED ORDER — ACETAMINOPHEN 325 MG PO TABS: 650.0000 mg | ORAL_TABLET | Freq: Four times a day (QID) | ORAL | Status: DC | PRN

## 2015-09-05 MED ORDER — PRO-STAT SUGAR FREE PO LIQD: 30.0000 mL | Freq: Two times a day (BID) | ORAL | Status: DC

## 2015-09-05 NOTE — Discharge Summary (Addendum)
Physician Discharge Summary  Ethan Stewart BRA:[id number] DOB: 1946-11-22 DOA: 08/20/2015  PCP:  Melinda Crutch, MD  Admit date: 08/20/2015 Discharge date: 09/05/2015  Time spent: Greater than 30 minutes  Recommendations for Outpatient Follow-up:  1. Dr. Brunetta Genera, Oncology on 09/11/15 at 8 AM for labs, 8:30 AM for M.D. follow-up and 9:30 AM for chemotherapy. SNF to arrange 2. Dr. Sherley Bounds, Neurosurgery: SNF to arrange follow-up appointment. 3. M.D. at SNF: In the next 2-3 days 4. Dr. Melinda Crutch, PCP upon discharge from SNF. 5. Recommend follow-up of chest x-ray in a week from discharge (follow-up of barotrauma with subcutaneous emphysema) and then 3-4 weeks later (pneumonia follow-up) 6. DC PICC line prior to DC- discussed with Dr. Irene Limbo.  Discharge Diagnoses:  Principal Problem:   MDD (major depressive disorder), recurrent severe, without psychosis Active Problems:   DLBCL (diffuse large B cell lymphoma)   HCAP (healthcare-associated pneumonia)   Sepsis   Anemia associated with chemotherapy   Sinus tachycardia   GERD (gastroesophageal reflux disease)   Blood poisoning   Hypoxia   Acute on chronic respiratory failure with hypoxia   ARDS (adult respiratory distress syndrome)   Acute respiratory failure   Pressure ulcer   Dysphagia, pharyngoesophageal phase   Protein-calorie malnutrition, severe   Acute respiratory failure with hypoxemia   Hypokalemia   Dysphagia   Elevated LFTs   Abnormal liver function tests   Discharge Condition: Improved & Stable  Diet recommendation: Dysphagia 3 diet and thin liquids.  Filed Weights   09/03/15 0419 09/04/15 0410 09/05/15 0438  Weight: 87.454 kg (192 lb 12.8 oz) 89.223 kg (196 lb 11.2 oz) 88.678 kg (195 lb 8 oz)    History of present illness:  69 y.o. M with diffuse large B cell lymphoma, brought to [facility] ED 8/29 with HCAP & ARDS. R-CHOP Chemo Rx last given on 08/09/2015. Status post C5 corpectomy on 8/3. Patient worsened and  intubated on 8/30. Care was taken over by CCM and transferred back to [facility] on 09/01/15.  Hospital Course:   Healthcare associated pneumonia - Has been on multiple antibiotics since 8/29. Infectious disease consulted. His initial bronchoscopy did not reveal much airway inflammation or secretions. However he subsequently worsened and repeat bronchoscopy 08/28/15 showed purulent secretions. - He received 13 days of antibiotics including 6 days of IV vancomycin and ceftazidime for possible healthcare associated pneumonia. His antibiotics had to be stopped a day prior to scheduled stop date due to concern for antibiotics causing abnormal LFTs. He has remained afebrile without clinical evidence of active infection at this time.  Acute respiratory failure secondary to ARDS & HCAP - Resolved. ETT 8/30 > 9/6 - Patient was on Decadron for possible pneumonitis. Decadron discontinued  Barotrauma with subcutaneous emphysema 9/2 - Intermittent chest x-ray. Pulmonary toilet  Mild troponin leak - Suspect demand ischemia  Hypertension - Controlled  Hypernatremia  - Improved/resolved   Hypokalemia - Replaced  GERD - PPI  Dysphagia - Failed swallow eval 9/8. Expect to improve. Meanwhile CCM placed Panda tube 9/9 and started tube feeds. Avoid TNA.  - Speech therapy evaluated again 9/12 and recommend dysphagia 3 diet and thin liquids. DC'ed PANDA 9/12. - After discussing with Dr. Sherley Bounds, neurosurgery c-collar discontinued 9/12. - Tolerating diet well. Consider speech therapy follow-up at SNF.  Diffuse large B-cell lymphoma, s/p one cycle of R-CHOP - Oncology follow-up appreciated - Patient cleared for discharge in oncology and will follow up at [place].  Anemia - Stable.  Anxiety - Patient has not been on benzodiazepines for greater than a week in the hospital. He appears stable and benzodiazepines will be discontinued.  Hx C5 tumor - s/p C5 corpectomy with C4-C6 arthrodesis and  anterior cervical plating of C4-C6 (Dr. Ronnald Ramp - 07/26/15) - Continue cervical collar. Has follow-up appointment with Dr. Ronnald Ramp on 9/19. - Has had right upper extremity monoparesis post surgery - Discussed with Dr. Sherley Bounds, Neurosurgery on 9/12: Repeat C-spine x-ray was reviewed and stable and cervical collar was discontinued.  Critical illness deconditioning/functional quadriplegia - PT and OT with plans for SNF for rehabilitation on discharge.  Depression/paranoia - Psychiatry input appreciated. Patient started on Celexa and Abilify. Appreciated oncology concern regarding Abilify interacting with chemotherapy. Updated psychiatry and await further recommendations. Psychiatric medications have been discontinued. Patient appears to be doing well off medications.  Delirium - resolved.  Hyperglycemia - SSI  Abnormal LFTs - Transaminitis jumped from close 200s to 250s range on 9/10-no obvious GI symptoms. Possibly from TNA-now discontinued versus IV Fortaz-discontinued 9/10. LFTs continu to gradually improve. RUQ ultrasound-no cholecystitis. Follow-up labs with oncology on 9/19.   Discussed with spouse at bedside.   Consultants:  CCM-signed off 9/9   Infectious disease  Oncology  Procedures:  ETT 8/30 > 9/6  LUE PICC line  Panda tube  Antibiotics: Zosyn, start date 8/29 >> 9/5 Azithro, start date 8/29 >> 9/2 Bactrim, start date 8/29 >> 8/31 Vanc 8/29 >> 9/1 Zosyn 8/28 >> 9/5 Vanco 9/5 >>9/10 Ceftazidime 9/5 >>9/10  Discharge Exam:  Complaints: Tolerating diet well. Denies complaints. Overall feels good and is excited to go to rehabilitation and be able to resume his chemotherapy soon.  Filed Vitals:   09/04/15 2047 09/05/15 0044 09/05/15 0438 09/05/15 0643  BP: 119/59 123/58 125/61 138/63  Pulse: 85 78 81 80  Temp: 98.5 F (36.9 C) 97.8 F (36.6 C) 97.8 F (36.6 C) 98.4 F (36.9 C)  TempSrc: Oral Oral Oral Oral  Resp: 18 18 20 18   Height:      Weight:    88.678 kg (195 lb 8 oz)   SpO2: 98% 95% 96% 97%    General exam: pleasant middle-aged male sitting up comfortably propped up in bed without distress.   Respiratory system: clear to auscultation. No increased work of breathing.  Cardiovascular system: S1 & S2 heard, RRR. No JVD, murmurs, gallops, clicks or pedal edema. Gastrointestinal system: Abdomen is nondistended, soft and nontender. Normal bowel sounds heard. Central nervous system: Alert and oriented. No focal neurological deficits. Extremities: Symmetric 5 x 5 power except right upper extremity with grade 3 x 5 power.  Discharge Instructions      Discharge Instructions    Type and screen    Complete by:  Aug 30, 2015      Call MD for:  difficulty breathing, headache or visual disturbances    Complete by:  As directed      Call MD for:  extreme fatigue    Complete by:  As directed      Call MD for:  hives    Complete by:  As directed      Call MD for:  persistant dizziness or light-headedness    Complete by:  As directed      Call MD for:  persistant nausea and vomiting    Complete by:  As directed      Call MD for:  severe uncontrolled pain    Complete by:  As directed  Call MD for:  temperature >100.4    Complete by:  As directed      Care order/instruction    Complete by:  As directed   Transfuse Parameters     Care order/instruction    Complete by:  As directed   Transfuse Parameters     Complete patient signature process for consent form    Complete by:  As directed      Complete patient signature process for consent form    Complete by:  As directed      Discharge instructions    Complete by:  As directed   Diet: Dysphagia 3 diet and thin liquids. Ground meat, strict precautions, no straws, medicine crushed with applesauce if not contraindicated     Increase activity slowly    Complete by:  As directed      Practitioner attestation of consent    Complete by:  As directed   I, the ordering practitioner,  attest that I have discussed with the patient the benefits, risks, side effects, alternatives, likelihood of achieving goals and potential problems during recovery for the procedure listed.  Procedure:  Blood Product(s)     Practitioner attestation of consent    Complete by:  As directed   I, the ordering practitioner, attest that I have discussed with the patient the benefits, risks, side effects, alternatives, likelihood of achieving goals and potential problems during recovery for the procedure listed.  Procedure:  Blood Product(s)            Medication List    STOP taking these medications        diazepam 5 MG tablet  Commonly known as:  VALIUM     HYDROcodone-acetaminophen 5-325 MG per tablet  Commonly known as:  NORCO/VICODIN     levofloxacin 750 MG tablet  Commonly known as:  LEVAQUIN     LORazepam 0.5 MG tablet  Commonly known as:  ATIVAN     predniSONE 20 MG tablet  Commonly known as:  DELTASONE     senna-docusate 8.6-50 MG per tablet  Commonly known as:  SENNA S     simvastatin 20 MG tablet  Commonly known as:  ZOCOR      TAKE these medications        acetaminophen 325 MG tablet  Commonly known as:  TYLENOL  Take 2 tablets (650 mg total) by mouth every 6 (six) hours as needed for mild pain, moderate pain, fever or headache (or Fever >/= 101).     allopurinol 300 MG tablet  Commonly known as:  ZYLOPRIM  Take 1 tablet (300 mg total) by mouth daily.     feeding supplement (PRO-STAT SUGAR FREE 64) Liqd  Take 30 mLs by mouth 2 (two) times daily.     lidocaine-prilocaine cream  Commonly known as:  EMLA  Apply to affected area once     multivitamin with minerals Tabs tablet  Take 1 tablet by mouth daily.     neomycin-bacitracin-polymyxin ointment  Commonly known as:  NEOSPORIN  Apply 1 application topically as needed for wound care (left foot blister). apply to eye     omeprazole 40 MG capsule  Commonly known as:  PRILOSEC  Take 1 capsule (40 mg  total) by mouth daily.     ondansetron 8 MG tablet  Commonly known as:  ZOFRAN  Take 1 tablet (8 mg total) by mouth 2 (two) times daily. Start the day after chemo for 3 days. Then as needed for nausea  or vomiting.     PRESCRIPTION MEDICATION  [facility] CHEMO     prochlorperazine 10 MG tablet  Commonly known as:  COMPAZINE  Take 1 tablet (10 mg total) by mouth every 6 (six) hours as needed (Nausea or vomiting).     RESOURCE THICKENUP CLEAR Powd  Oral, As needed, other,          The results of significant diagnostics from this hospitalization (including imaging, microbiology, ancillary and laboratory) are listed below for reference.    Significant Diagnostic Studies: Dg Chest 2 View  08/20/2015   CLINICAL DATA:  Fever, cough, dyspnea. One week out from first treatment for non-Hodgkin's lymphoma.  EXAM: CHEST  2 VIEW  COMPARISON:  08/19/2015  FINDINGS: There is developing alveolar opacity in the central lung bilaterally, a significant worsening from 08/19/2015. This may represent infectious infiltrate. Alveolar edema or hemorrhage is less likely. There are no pleural effusions. Heart size is unchanged.  IMPRESSION: Worsened central lung alveolar opacities bilaterally, suspicious for infectious infiltrates.   Electronically Signed   By: Andreas Newport M.D.   On: 08/20/2015 23:31   Dg Chest 2 View  08/19/2015   CLINICAL DATA:  Fever.  History of bone cancer.  EXAM: CHEST  2 VIEW  COMPARISON:  PET-CT - 07/10/2015; chest CT - 06/11/2015  FINDINGS: Grossly unchanged cardiac silhouette and mediastinal contours. There is mild diffuse slightly nodular thickening of the pulmonary arch ischium, most conspicuous within the peripheral aspects of the bilateral upper/ mid lungs. Minimal bibasilar heterogeneous opacities favored to represent atelectasis or scar. No discrete focal airspace opacities. No pleural effusion or pneumothorax. No no acute osseous abnormalities. Post lower cervical ACDF, incompletely  evaluated.  IMPRESSION: Findings suggestive of airways disease / bronchitis. No focal airspace opacities to suggest pneumonia.   Electronically Signed   By: Sandi Mariscal M.D.   On: 08/19/2015 09:24   Dg Cervical Spine 1 View  09/04/2015   CLINICAL DATA:  Postoperative, surgical complication, subsequent encounter.  EXAM: DG CERVICAL SPINE - 1 VIEW  COMPARISON:  None.  FINDINGS: Patient status post anterior fusion of C4, C5 and C6 without malalignment. The prevertebral soft tissues are normal.  IMPRESSION: Status post a anterior fusion of cervical spine without malalignment.   Electronically Signed   By: Abelardo Diesel M.D.   On: 09/04/2015 10:56   Ct Angio Chest Pe W/cm &/or Wo Cm  08/21/2015   CLINICAL DATA:  Hypoxia, cough, and shortness of breath for 2 days. Pneumonia. Large B-cell lymphoma with ongoing chemotherapy. Productive cough with fever and chills.  EXAM: CT ANGIOGRAPHY CHEST WITH CONTRAST  TECHNIQUE: Multidetector CT imaging of the chest was performed using the standard protocol during bolus administration of intravenous contrast. Multiplanar CT image reconstructions and MIPs were obtained to evaluate the vascular anatomy.  CONTRAST:  113mL OMNIPAQUE IOHEXOL 350 MG/ML SOLN  COMPARISON:  06/21/2015  FINDINGS: THORACIC INLET/BODY WALL:  No acute abnormality.  MEDIASTINUM:  Normal heart size. No pericardial effusion. Suboptimal pulmonary artery opacification due to bolus dispersion and intermittent motion, but diagnostic and negative for pulmonary embolism. Four vessel aortic arch without aortic dissection or aneurysm.  LUNG WINDOWS:  There is ground-glass patchy lung opacity with apical predominance. The opacities are fairly symmetric. No interlobular septal thickening at the bases or in non opacified portions of the lungs. A subpleural nodule in the right lower lobe on lung series image 54 is stable, appearance favoring incidental lymph node. Mild dependent atelectasis and trace pleural effusions.  No  intrathoracic adenopathy.  UPPER ABDOMEN:  Pneumobilia which is chronic.  No indication of abdominal pain.  OSSEOUS:  No acute fracture.  No suspicious lytic or blastic lesions.  Review of the MIP images confirms the above findings.  IMPRESSION: 1. Bilateral airspace disease consistent with atypical pneumonia or noncardiogenic edema. 2. No evidence of pulmonary embolism. 3. Chronic pneumobilia.   Electronically Signed   By: Monte Fantasia M.D.   On: 08/21/2015 10:35   Mr Cervical Spine W Wo Contrast  08/07/2015   CLINICAL DATA:  Previous corpectomy at C5 due to tumor involvement. Unable to raise the right arm because of weakness. Symptoms began 08/05/2015. Personal history of lymphoma.  EXAM: MRI CERVICAL SPINE WITHOUT AND WITH CONTRAST  TECHNIQUE: Multiplanar and multiecho pulse sequences of the cervical spine, to include the craniocervical junction and cervicothoracic junction, were obtained according to standard protocol without and with intravenous contrast.  CONTRAST:  27mL MULTIHANCE GADOBENATE DIMEGLUMINE 529 MG/ML IV SOLN  COMPARISON:  Preoperative study 07/20/2015  FINDINGS: No abnormality is seen above or below the operative region of C4 through C6. No osseous lesion. No stenosis of the canal or foramina.  In the operative region of C4 through C6, the patient has had corpectomy at C5 with anterior plate and screws from C4 through C6 with an intervening strut graft. The spinal canal is widely patent with ample subarachnoid space surrounding the cord. No evidence of cord insult.  There appears to be right foraminal involvement by tumor at C4-5 and C5-6. This could affect the C5 and C6 nerve roots.  IMPRESSION: Interval corpectomy at C5 with fusion from C4 through C6 with intervening strut graft. Wide patency of the central canal. No abnormal cord signal.  Enhancing tissue in the foraminal regions at C4-5 and C5-6 on the right consistent with tumor. This could affect the C5 and C6 nerve roots.    Electronically Signed   By: Nelson Chimes M.D.   On: 08/07/2015 14:58   Dg Chest Port 1 View  09/01/2015   CLINICAL DATA:  ARDS.  EXAM: PORTABLE CHEST - 1 VIEW  COMPARISON:  08/30/2015.  FINDINGS: Left PICC line in stable position . Cardiomegaly. Low lung volumes with stable bibasilar atelectasis and/or infiltrates. No pleural effusion or pneumothorax. Right supraclavicular subcutaneous emphysema is stable.  IMPRESSION: 1. Left PICC line stable position. 2. Low lung volumes with stable bibasilar subsegmental atelectasis and/or infiltrates. 3. Stable cardiomegaly. 4. Right supraclavicular subcutaneous emphysema unchanged. No pneumothorax.   Electronically Signed   By: Marcello Moores  Register   On: 09/01/2015 07:02   Dg Chest Port 1 View  08/30/2015   CLINICAL DATA:  Respiratory failure/hypoxia  EXAM: PORTABLE CHEST - 1 VIEW  COMPARISON:  August 29, 2015  FINDINGS: Endotracheal tube and nasogastric tube have been removed. Central catheter tip is in the superior vena cava near the cavoatrial junction, stable. There is no demonstrable pneumothorax. There is, however, subcutaneous air, primarily on the right, stable. There is patchy airspace disease in both lower lobes, slightly more on the left than on the right, stable. No new opacity. Heart size and pulmonary vascularity are normal. No adenopathy. No bone lesions.  IMPRESSION: There is subcutaneous air, primarily on the right, but no pneumothorax appreciable. Patchy airspace disease in the bases is stable. No new opacity. No change in cardiac silhouette.   Electronically Signed   By: Lowella Grip III M.D.   On: 08/30/2015 07:03   Dg Chest Port 1 View  08/29/2015   CLINICAL  DATA:  ARDS.  EXAM: PORTABLE CHEST - 1 VIEW  COMPARISON:  08/28/2015.  FINDINGS: Endotracheal tube, NG tube, left PICC line in stable position. Heart size stable. Diffuse bilateral airspace disease with basilar atelectasis noted. No pleural effusion pneumothorax. Right chest wall subcutaneous  emphysema again noted .  IMPRESSION: 1. Lines and tubes in stable position. 2. Diffuse bilateral airspace disease with bibasilar atelectasis. 3. Right chest wall subcutaneous emphysema again noted. No pneumothorax.   Electronically Signed   By: Marcello Moores  Register   On: 08/29/2015 07:10   Dg Chest Port 1 View  08/28/2015   CLINICAL DATA:  69 year old male with increasing oxygen requirements and known subcutaneous emphysema. Evaluate for pneumothorax.  EXAM: PORTABLE CHEST - 1 VIEW  COMPARISON:  Prior chest x-ray earlier today 08/28/2015 1:39 a.m.  FINDINGS: Tracheostomy tube is 4.8 cm above the carina. Left upper extremity PICC remains in good position with the tip overlying the superior cavoatrial junction. A nasogastric tube is present. The tip overlies the upper stomach in unchanged position.  Persistent mediastinal air with increasing subcutaneous emphysema in the soft tissues of the right neck and shoulder. Additionally, there is progressive opacification of the right lung base with developing air bronchograms. There is associated volume loss. This is favored to represent atelectasis. Aeration in the left lung base has improved compared to the prior imaging. No acute osseous abnormality.  IMPRESSION: 1. Progressive opacification and volume loss in the right lower lobe concerning for increasing atelectasis versus developing infiltrate. Given the rapid interval change, atelectasis is strongly favored. 2. No evidence of pneumothorax although there is increasing subcutaneous emphysema in the soft tissues of the right neck, shoulder and lateral chest. 3. Improved aeration in the left lung base likely reflecting resolving left basilar atelectasis. 4. Stable and satisfactory support apparatus.   Electronically Signed   By: Jacqulynn Cadet M.D.   On: 08/28/2015 09:58   Dg Chest Port 1 View  08/28/2015   CLINICAL DATA:  ARDS.  History of lymphoma.  Worsening hypoxia.  EXAM: PORTABLE CHEST - 1 VIEW  COMPARISON:   08/27/2015  FINDINGS: Endotracheal tube with tip measuring 7.2 cm above the carinal. Enteric tube tip is in the upper stomach just below the EG junction. Left PICC catheter with tip over the cavoatrial junction region. Shallow inspiration. Patchy linear infiltrates in the mid lungs and right lower lung appear generally stable since previous study. No blunting of costophrenic angles. Subcutaneous emphysema in the right neck and anterior chest wall without definite change. Pneumomediastinum on the right. No definite pneumothorax.  IMPRESSION: Appliances are unchanged in position. Patchy airspace infiltrates in the lungs without change. Subcutaneous emphysema in the right neck and upper chest with pneumo mediastinum. No significant change.   Electronically Signed   By: Lucienne Capers M.D.   On: 08/28/2015 05:20   Dg Chest Port 1 View  08/27/2015   CLINICAL DATA:  ARDS  EXAM: PORTABLE CHEST - 1 VIEW  COMPARISON:  08/26/2015 and prior radiographs  FINDINGS: Cardiomediastinal silhouette is unchanged.  Bilateral interstitial and airspace opacities are unchanged.  An endotracheal tube with tip 7.5 cm above the carina and left-sided PICC line with tip overlying the superior cavoatrial junction again noted.  Right subcutaneous emphysema and pneumomediastinum again noted.  There is no evidence of pneumothorax.  IMPRESSION: No significant change of interstitial and bilateral airspace opacities, pneumomediastinum and right subcutaneous emphysema. No evidence of pneumothorax.   Electronically Signed   By: Margarette Canada M.D.   On:  08/27/2015 09:25   Dg Chest Port 1 View  08/26/2015   CLINICAL DATA:  Acute respiratory failure.  EXAM: PORTABLE CHEST - 1 VIEW  COMPARISON:  08/25/2015, 08/24/2015, 08/22/2015, 08/19/2015  FINDINGS: Bilateral perihilar interstitial and alveolar airspace opacities. Left basilar airspace disease. No pleural effusion or pneumothorax. Stable cardiomediastinal silhouette. Nasogastric tube coursing below  the diaphragm with the tip excluded from the field of view. No acute osseous abnormality. Soft tissue emphysema at the base of the right side of the neck. No acute osseous abnormality.  IMPRESSION: 1. No significant interval change in bilateral perihilar interstitial and alveolar airspace opacities and left basilar airspace disease. Differential considerations include pulmonary edema versus multifocal pneumonia. 2. Soft tissue emphysema at the base of the right side of the neck. This is similar in appearance to the prior exam but new compared with 08/22/2015. Correlate with any interval instrumentation.   Electronically Signed   By: Kathreen Devoid   On: 08/26/2015 09:10   Dg Chest Port 1 View  08/25/2015   CLINICAL DATA:  Respiratory failure intubated patient  EXAM: PORTABLE CHEST - 1 VIEW  COMPARISON:  Portable chest x-ray of August 24, 2015  FINDINGS: The lungs are well-expanded. Confluent interstitial and alveolar opacities persist but are slightly less conspicuous today. The retrocardiac region remains dense with partial obscuration of the hemidiaphragm. The cardiac silhouette remains enlarged. The pulmonary vascularity is more distinct but still engorged.  The endotracheal tube tip lies 8.2 cm above the carina. The esophagogastric tube tip projects below the inferior margin of the image. The left PICC line tip projects in the mid SVC.  IMPRESSION: Slight interval improvement of bilateral interstitial and alveolar opacities which suggests improving pulmonary edema/ARDS and/or pneumonia.   Electronically Signed   By: David  Martinique M.D.   On: 08/25/2015 07:28   Dg Chest Port 1 View  08/24/2015   CLINICAL DATA:  Respiratory failure, sepsis, ARDS.  EXAM: PORTABLE CHEST - 1 VIEW  COMPARISON:  Portable chest x-ray of August 23, 2015.  FINDINGS: The lungs are adequately inflated. There are fluffy alveolar opacities bilaterally. The retrocardiac region on the left is more dense today with obscuration of the  hemidiaphragm. The cardiac silhouette is mildly enlarged. The pulmonary vascularity is indistinct. There is no pneumothorax. The endotracheal tube tip lies 3.7 cm above the crotch of the carina. The esophagogastric tube tip projects below the inferior margin of the image.  IMPRESSION: Slight interval worsening in the appearance of the pulmonary interstitium bilaterally with increasing atelectasis or infiltrate in the left lower lobe. The pattern is consistent with ARDS. Stable mild pulmonary vascular congestion.   Electronically Signed   By: David  Martinique M.D.   On: 08/24/2015 07:35   Dg Chest Port 1 View  08/23/2015   CLINICAL DATA:  Respiratory failure  EXAM: PORTABLE CHEST - 1 VIEW  COMPARISON:  08/22/2015  FINDINGS: Endotracheal tube is 6 cm above the carina. Nasogastric tube extends into the stomach. Consolidated airspace opacities persist in the central lung regions bilateral, extending into the left base, without significant interval change. No pneumothorax or large effusion evident.  IMPRESSION: Support equipment appears satisfactorily positioned.  No significant interval change in the bilateral airspace opacities.   Electronically Signed   By: Andreas Newport M.D.   On: 08/23/2015 05:27   Dg Chest Port 1 View  08/22/2015   CLINICAL DATA:  ARDS  EXAM: PORTABLE CHEST - 1 VIEW  COMPARISON:  August 22, 2015 study obtained earlier in the  day  FINDINGS: Endotracheal tube tip is 7.3 cm above the carina. No pneumothorax. There is widespread airspace consolidation bilaterally, most pronounced in the mid and upper lung zones, stable. There is also patchy infiltrate in the left base, stable. No new opacity. Heart is upper normal in size with pulmonary vascularity within normal limits. No adenopathy apparent.  IMPRESSION: Endotracheal tube as described without pneumothorax. Areas of interstitial and alveolar opacity bilaterally are stable. The appearance is consistent with ARDS. Superimposed pneumonia is  suspected. No change in cardiac silhouette.   Electronically Signed   By: Lowella Grip III M.D.   On: 08/22/2015 12:00   Dg Chest Port 1 View  08/22/2015   CLINICAL DATA:  ARDS  EXAM: PORTABLE CHEST - 1 VIEW  COMPARISON:  08/20/2015  FINDINGS: There is worsening central airspace opacity, becoming more confluent. There is no pneumothorax. There is no large effusion.  IMPRESSION: Worsening bilateral airspace opacities.   Electronically Signed   By: Andreas Newport M.D.   On: 08/22/2015 06:48   Dg Chest Port 1v Same Day  08/28/2015   CLINICAL DATA:  Status post bronchoscopy. Evaluate for pneumothorax.  EXAM: PORTABLE CHEST - 1 VIEW SAME DAY  COMPARISON:  08/28/2015 and prior exams  FINDINGS: An endotracheal tube with tip 3.5 cm above the carina, NG tube with tip overlying the proximal stomach and left PICC line with tip overlying the lower SVC again noted.  Right lung airspace disease again noted with peak right lower lung airspace disease/atelectasis.  Mild left basilar atelectasis/ airspace disease again noted.  There is no evidence of pneumothorax.  Right subcutaneous emphysema is again identified.  IMPRESSION: No evidence of pneumothorax status post bronchoscopy.  Decreased right lower lung atelectasis/ airspace disease. No other significant change.   Electronically Signed   By: Margarette Canada M.D.   On: 08/28/2015 11:26   Dg Abd Portable 1v  09/01/2015   CLINICAL DATA:  Feeding tube placement  EXAM: PORTABLE ABDOMEN - 1 VIEW  COMPARISON:  08/22/2015  FINDINGS: Dobbhoff feeding tube is seen projecting over the stomach along the greater curvature with tip in the region of the antrum/pylorus.  IMPRESSION: Dobbhoff feeding tube as described   Electronically Signed   By: Skipper Cliche M.D.   On: 09/01/2015 12:21   Dg Abd Portable 1v  08/22/2015   CLINICAL DATA:  OG tube placement  EXAM: PORTABLE ABDOMEN - 1 VIEW  COMPARISON:  None.  FINDINGS: Enteric tube head tip is in the transpyloric region, likely  distal stomach. Nonobstructive bowel gas pattern. No free air organomegaly.  IMPRESSION: NG tube tip in the transpyloric region, likely distal stomach   Electronically Signed   By: Rolm Baptise M.D.   On: 08/22/2015 14:00   Dg Swallowing Func-speech Pathology  09/04/2015    Objective Swallowing Evaluation:    Patient Details  Name: Ethan Stewart MRN: [id number] Date of Birth: [birth year]  Today's Date: 09/04/2015 Time: SLP Start Time (ACUTE ONLY): 0835-SLP Stop Time (ACUTE ONLY): 0906 SLP Time Calculation (min) (ACUTE ONLY): 31 min  Past Medical History:  Past Medical History  Diagnosis Date  . History of bleeding ulcers 1990's  . 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 Anxiety   . GERD (gastroesophageal reflux disease)   . H. pylori infection   . History of blood transfusion   . Bone cancer      tumor on C5  . Arm numbness 08/07/15    Limited movement due to tumor on spine  .  Diffuse large B cell lymphoma     biposy 07/25/2105   Past Surgical History:  Past Surgical History  Procedure Laterality Date  . Hernia repair Bilateral     with mesh  . Colonoscopy    . Eye surgery Right     catheter  . Anterior cervical corpectomy N/A 07/26/2015    Procedure: Cervical five Corpectomy/Cervical four-six Fusion/Plate ;   Surgeon: Eustace Moore, MD;  Location: [facility];  Service:  Neurosurgery;  Laterality: N/A;  C5 Corpectomy/C4-6 Fusion/Plate C4-6   HPI:  Other Pertinent Information: 69 y.o. male with a history of Diffuse Large  B-Cell Lymphoma diagnosed 07/2015 on R-[facility] Chemo Rx, GERD, bone cancer,  cervical spine tumor, bleeding ulcers admitted with complaints of fevers  and chills and cough with SOB x 2 days. Pt underwent s/p C5 corpectomy  with C4-C6 arthrodesis and anterior cervical plating of C4-C6 8/13.  Intubated 8/30-9/6. CXR 9/5 progressive opacification and volume loss in  the right lower lobe concerning for increasing atelectasis versus  developing infiltrate and repeat CXR 9/7 there is subcutaneous air,  primarily  on the right, but no pneumothorax appreciable. Patchy airspace  disease in the bases is stable. No new opacity. No change in cardiac  silhouette. RN reported coughing after ice chips and BSE obtained  yesterday with pt being placed on modified diet.   Pt seen to inform  his/wife of this SLP recommendation for repeat MBS today as he received  tube feeding over the weekend.  Multiple risk factors for silent nature of  dysphagia present.    No Data Recorded  Assessment / Plan / Recommendation CHL IP CLINICAL IMPRESSIONS 09/04/2015  Therapy Diagnosis Moderate pharyngeal phase dysphagia  Clinical Impression Mild pharyngeal dysphagia with ongoing pharyngeal with  improved clearance since prior MBS last Thursday 08/31/15.  Moderate  pharyngeal residuals continues (vallecular more than pyriform sinus) -  worse with solids than liquids - due to decreased tongue base retraction  epiglottic deflection, and laryngeal elevation.  Pt conducting dry  swallows, cough, expectoration during evaluation to compensate and aid  clearance with verbal cue.  He continues to be sensory impaired to  residuals unfortunately.  Trace aspiration of thin via straw noted without  pt sensation.  SLP ?s if dobhoff impacted pharyngeal clearance further due  to narrowed pharynx and epiglottis contacting tube.    Pt has been conducting swallowing exercises consistently over the weekend  and has demonstrated improved phonatory and swallow ability.  Recommend  consider to dc Dobhoff and initiate diet with strict precautions.  Folllow  up SLP indicated for dysphagia management to furhter strengthen pharyngeal  swallow musculature.  Using live monitor SLP educated pt/spouse to  findings and reinforced effective compensation strategies.  Will continue  to follow for management.  MD paged at [place] with recommendations.         CHL IP TREATMENT RECOMMENDATION 09/04/2015  Treatment Recommendations Therapy as outlined in treatment plan below     CHL IP DIET  RECOMMENDATION 09/04/2015  SLP Diet Recommendations Dysphagia 3 (Mech soft);Thin  Liquid Administration via Thin, prefer water, no straws  Medication Administration Crushed with puree  Compensations Slow rate;Small sips/bites;Multiple dry swallows after each  bite/sip;Follow solids with liquid;Hard cough after swallow;Effortful  swallow, start intake with water  Postural Changes and/or Swallow Maneuvers Upright 90* and for 30 min after  meals     CHL IP OTHER RECOMMENDATIONS 09/04/2015  Recommended Consults (None)  Oral Care Recommendations Oral care BID  Other Recommendations Have oral suction available     CHL IP FOLLOW UP RECOMMENDATIONS 09/01/2015  Follow up Recommendations SNF     CHL IP FREQUENCY AND DURATION 09/04/2015  Speech Therapy Frequency (ACUTE ONLY) min 2x/week  Treatment Duration 1 week     Pertinent Vitals/Pain     SLP Swallow Goals No flowsheet data found.  No flowsheet data found.    CHL IP REASON FOR REFERRAL 09/04/2015  Reason for Referral Objectively evaluate swallowing function     CHL IP ORAL PHASE 09/04/2015  Lips (None)  Tongue (None)  Mucous membranes (None)  Nutritional status (None)  Other (None)  Oxygen therapy (None)  Oral Phase WFL  Oral - Pudding Teaspoon (None)  Oral - Pudding Cup (None)  Oral - Honey Teaspoon (None)  Oral - Honey Cup (None)  Oral - Honey Syringe (None)  Oral - Nectar Teaspoon (None)  Oral - Nectar Cup (None)  Oral - Nectar Straw (None)  Oral - Nectar Syringe (None)  Oral - Ice Chips (None)  Oral - Thin Teaspoon (None)  Oral - Thin Cup (None)  Oral - Thin Straw (None)  Oral - Thin Syringe (None)  Oral - Puree (None)  Oral - Mechanical Soft (None)  Oral - Regular (None)  Oral - Multi-consistency (None)  Oral - Pill (None)  Oral Phase - Comment (None)      CHL IP PHARYNGEAL PHASE 09/04/2015  Pharyngeal Phase (None)  Pharyngeal - Pudding Teaspoon (None)  Penetration/Aspiration details (pudding teaspoon) (None)  Pharyngeal - Pudding Cup (None)  Penetration/Aspiration details  (pudding cup) (None)  Pharyngeal - Honey Teaspoon (None)  Penetration/Aspiration details (honey teaspoon) (None)  Pharyngeal - Honey Cup (None)  Penetration/Aspiration details (honey cup) (None)  Pharyngeal - Honey Syringe (None)  Penetration/Aspiration details (honey syringe) (None)  Pharyngeal - Nectar Teaspoon (None)  Penetration/Aspiration details (nectar teaspoon) (None)  Pharyngeal - Nectar Cup (None)  Penetration/Aspiration details (nectar cup) (None)  Pharyngeal - Nectar Straw (None)  Penetration/Aspiration details (nectar straw) (None)  Pharyngeal - Nectar Syringe (None)  Penetration/Aspiration details (nectar syringe) (None)  Pharyngeal - Ice Chips (None)  Penetration/Aspiration details (ice chips) (None)  Pharyngeal - Thin Teaspoon (None)  Penetration/Aspiration details (thin teaspoon) (None)  Pharyngeal - Thin Cup (None)  Penetration/Aspiration details (thin cup) (None)  Pharyngeal - Thin Straw (None)  Penetration/Aspiration details (thin straw) (None)  Pharyngeal - Thin Syringe (None)  Penetration/Aspiration details (thin syringe') (None)  Pharyngeal - Puree (None)  Penetration/Aspiration details (puree) (None)  Pharyngeal - Mechanical Soft (None)  Penetration/Aspiration details (mechanical soft) (None)  Pharyngeal - Regular (None)  Penetration/Aspiration details (regular) (None)  Pharyngeal - Multi-consistency (None)  Penetration/Aspiration details (multi-consistency) (None)  Pharyngeal - Pill (None)  Penetration/Aspiration details (pill) (None)  Pharyngeal Comment multiple swallows, following solids with liquids,  cough/throat clear/expectoration helpful      CHL IP CERVICAL ESOPHAGEAL PHASE 09/04/2015  Cervical Esophageal Phase (None)  Pudding Teaspoon (None)  Pudding Cup (None)  Honey Teaspoon (None)  Honey Cup (None)  Honey Straw (None)  Nectar Teaspoon (None)  Nectar Cup (None)  Nectar Straw (None)  Nectar Sippy Cup (None)  Thin Teaspoon (None)  Thin Cup (None)  Thin Straw (None)  Thin Sippy Cup  (None)  Cervical Esophageal Comment mildly decreased clearance of liquids into  upper esophagus, improved compared to prior eval    No flowsheet data found.         Luanna Salk, [facility] SLP [phone]    Dg Swallowing Func-speech Pathology  08/31/2015  Objective Swallowing Evaluation:    Patient Details  Name: Ethan Stewart MRN: [id number] Date of Birth: 07/13/1946  Today's Date: 08/31/2015 Time: SLP Start Time (ACUTE ONLY): 1402-SLP Stop Time (ACUTE ONLY): 1426 SLP Time Calculation (min) (ACUTE ONLY): 24 min  Past Medical History:  Past Medical History  Diagnosis Date  . History of bleeding ulcers 1990's  . 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 Anxiety   . GERD (gastroesophageal reflux disease)   . H. pylori infection   . History of blood transfusion   . Bone cancer      tumor on C5  . Arm numbness 08/07/15    Limited movement due to tumor on spine  . Diffuse large B cell lymphoma     biposy 07/25/2105   Past Surgical History:  Past Surgical History  Procedure Laterality Date  . Hernia repair Bilateral     with mesh  . Colonoscopy    . Eye surgery Right     catheter  . Anterior cervical corpectomy N/A 07/26/2015    Procedure: Cervical five Corpectomy/Cervical four-six Fusion/Plate ;   Surgeon: Eustace Moore, MD;  Location: [facility];  Service:  Neurosurgery;  Laterality: N/A;  C5 Corpectomy/C4-6 Fusion/Plate C4-6   HPI:  Other Pertinent Information: 69 y.o. male with a history of Diffuse Large  B-Cell Lymphoma diagnosed 07/2015 on R-[facility] Chemo Rx, GERD, bone cancer,  cervical spine tumor, bleeding ulcers admitted with complaints of fevers  and chills and cough with SOB x 2 days. Pt underwent s/p C5 corpectomy  with C4-C6 arthrodesis and anterior cervical plating of C4-C6 8/13.  Intubated 8/30-9/6. CXR 9/5 progressive opacification and volume loss in  the right lower lobe concerning for increasing atelectasis versus  developing infiltrate and repeat CXR 9/7 there is subcutaneous air,  primarily on the right, but no  pneumothorax appreciable. Patchy airspace  disease in the bases is stable. No new opacity. No change in cardiac  silhouette. RN reported coughing after ice chips and BSE obtained  yesterday with pt being placed on modified diet.  Today pt seen to inform  his/wife of this SLP recommendation for MBS due to mulitple risk factors  for silent nature of dysphagia.    No Data Recorded  Assessment / Plan / Recommendation CHL IP CLINICAL IMPRESSIONS 08/31/2015  Therapy Diagnosis Moderate oral phase dysphagia;Moderate pharyngeal phase  dysphagia  Clinical Impression Moderate sensorimotor oropharyngeal dysphagia Pt with  moderate sensorimotor oropharyngeal dysphagia with trace aspiration of  thin liquid below vocal cords = with subtle throat clearing. Largest  issue is weakness resulting in pharyngeal residuals *vallecular more than  pyriform* across all consistencies without awareness/sensation. Thicker  viscosity resulted in increased residuals. Dry nor liquid swallows  cleared residuals and pt unable to perform "expectoration" hock to clear.  Pt will be an aspiration risk regardless of consistency due to residuals  that he does not sense nor is able to clear. But he may be less of a risk  with liquids due to fewer residuals.Using live monitor, educated pt to  findings.   At end of study, patient stated he has no problems swallowing - which is  different information than he provided earlier.Encouraged pt to  strengthen his "hocking" ability to help remove vallecular stasis.  Uncertain to level swallowing will improve given pt's medical diagnosis  and prior patient report of hoarseness/speech/swallow deficits for 2 weeks  prior to admit. Options: Full liquids with accepted risks   Multiple  swallows with each bolus.  Intermittent throat clear (especially if vocal  quality is wet)  Expectoration throughout intake as able.  Stop intake if  pt coughing  OR NPO except ice chips and tsps water.  Consider repeat MBS   with improved medical status.         CHL IP TREATMENT RECOMMENDATION 08/31/2015  Treatment Recommendations Therapy as outlined in treatment plan below     CHL IP DIET RECOMMENDATION 08/31/2015  SLP Diet Recommendations NPO;Ice chips PRN after oral care or full liquid  with accepted aspiration risk  Liquid Administration via Cup, straw  Medication Administration Crushed with puree  Compensations Slow rate;Small sips/bites;Multiple dry swallows after each  bite/sip;Clear throat intermittently, cough if voice wet/gurgly  Postural Changes and/or Swallow Maneuvers Stay upright after meals     CHL IP OTHER RECOMMENDATIONS 08/31/2015  Recommended Consults (None)  Oral Care Recommendations Oral care QID  Other Recommendations (None)     No flowsheet data found.   CHL IP FREQUENCY AND DURATION 08/31/2015  Speech Therapy Frequency (ACUTE ONLY) min 1 x/week  Treatment Duration 1 week         CHL IP REASON FOR REFERRAL 08/31/2015  Reason for Referral Objectively evaluate swallowing function     CHL IP ORAL PHASE 08/31/2015  Lips (None)  Tongue (None)  Mucous membranes (None)  Nutritional status (None)  Other (None)  Oxygen therapy (None)  Oral Phase Impaired  Oral - Pudding Teaspoon (None)  Oral - Pudding Cup (None)  Oral - Honey Teaspoon (None)  Oral - Honey Cup (None)  Oral - Honey Syringe (None)  Oral - Nectar Teaspoon (None)  Oral - Nectar Cup (None)  Oral - Nectar Straw (None)  Oral - Nectar Syringe (None)  Oral - Ice Chips (None)  Oral - Thin Teaspoon (None) Oral - Thin Cup (None)  Oral - Thin Straw (None)  Oral - Thin Syringe (None)  Oral - Puree (None)  Oral - Mechanical Soft (None)  Oral - Regular (None)  Oral - Multi-consistency (None)  Oral - Pill (None)  Oral Phase - Comment (None)      CHL IP PHARYNGEAL PHASE 08/31/2015  Pharyngeal Phase Impaired  Pharyngeal - Pudding Teaspoon (None)  Penetration/Aspiration details (pudding teaspoon) (None)  Pharyngeal - Pudding Cup (None)  Penetration/Aspiration details (pudding cup) (None)   Pharyngeal - Honey Teaspoon (None)  Penetration/Aspiration details (honey teaspoon) (None)  Pharyngeal - Honey Cup (None)  Penetration/Aspiration details (honey cup) (None)  Pharyngeal - Honey Syringe (None)  Penetration/Aspiration details (honey syringe) (None)  Pharyngeal - Nectar Teaspoon (None)  Penetration/Aspiration details (nectar teaspoon) (None)  Pharyngeal - Nectar Cup (None)  Penetration/Aspiration details (nectar cup) (None)  Pharyngeal - Nectar Straw (None)  Penetration/Aspiration details (nectar straw) (None)  Pharyngeal - Nectar Syringe (None)  Penetration/Aspiration details (nectar syringe) (None)  Pharyngeal - Ice Chips (None)  Penetration/Aspiration details (ice chips) (None)  Pharyngeal - Thin Teaspoon (None)  Penetration/Aspiration details (thin teaspoon) (None)  Pharyngeal - Thin Cup (None)  Penetration/Aspiration details (thin cup) (None)  Pharyngeal - Thin Straw (None)  Penetration/Aspiration details (thin straw) (None)  Pharyngeal - Thin Syringe (None)  Penetration/Aspiration details (thin syringe') (None)  Pharyngeal - Puree (None)  Penetration/Aspiration details (puree) (None)  Pharyngeal - Mechanical Soft (None)  Penetration/Aspiration details (mechanical soft) (None)  Pharyngeal - Regular (None)  Penetration/Aspiration details (regular) (None)  Pharyngeal - Multi-consistency (None)  Penetration/Aspiration details (multi-consistency) (None)  Pharyngeal - Pill (None)  Penetration/Aspiration details (pill) (None)  Pharyngeal Comment pt WITHOUT sensation to residuals nor  did he clear them  with CUED dry swallows due to gross weakness, pt did not clear residuals  with cued "hocking" either although he made a good effort      CHL IP CERVICAL ESOPHAGEAL PHASE 08/31/2015  Cervical Esophageal Phase Impaired  Pudding Teaspoon (None)  Pudding Cup (None)  Honey Teaspoon (None)  Honey Cup (None)  Honey Straw (None)  Nectar Teaspoon (None)  Nectar Cup (None)  Nectar Straw (None)  Nectar Sippy Cup (None)   Thin Teaspoon (None)  Thin Cup (None)  Thin Straw (None)  Thin Sippy Cup (None)  Cervical Esophageal Comment decreased clearance into upper  esophagus-pyriform sinus residuals             Luanna Salk, MS [facility] SLP [phone]    Dg Fluoro Guide Spinal/si Jt Inj Left  08/09/2015   CLINICAL DATA:  69 year old male with newly diagnosed diffuse large B-cell lymphoma.  EXAM: DIAGNOSTIC LUMBAR PUNCTURE UNDER FLUOROSCOPIC GUIDANCE  FLUOROSCOPY TIME:  54 seconds  PROCEDURE: Informed consent was obtained from the patient prior to the procedure, including potential complications of headache, allergy, and pain. With the patient prone, the lower back was prepped with Betadine. 1% Lidocaine was used for local anesthesia. Lumbar puncture was performed at the L2-L3 level using a 20 gauge needle with return of clearCSF with an opening pressure of 17 cm water. 6 ml of CSF were obtained for laboratory studies.  Subsequently, 12 mg of methotrexate (total volume of 10 mL) was slowly infused intrathecally. The needle was withdrawn from the patient, and a small bandage was placed over the entrance wound. The patient tolerated the procedure well and there were no apparent complications.  IMPRESSION: 1. Successful diagnostic and therapeutic lumbar puncture at L2-L3, foreign intrathecal methotrexate administration, as detailed above.   Electronically Signed   By: Vinnie Langton M.D.   On: 08/09/2015 17:01   Dg Fluoro Guide Spinal/si Jt Inj Left  08/09/2015   Etheleen Mayhew, MD     08/09/2015  1:26 PM Lumbar puncture performed at L2-L3.  Opening pressure 17 cm H20.   6 ml CSF clear CSF collected and sent to laboratory for analysis  per orders of primary team.  12 mg Methorexate in total volume of  10 mL injected slowly.  Needle removed.  No bleeding or other  complications.  See full dictation in PACS.   US Abdomen Limited Ruq  09/03/2015   CLINICAL DATA:  69 year old male with a history of transaminitis.  EXAM: US ABDOMEN LIMITED  - RIGHT UPPER QUADRANT  COMPARISON:  None.  FINDINGS: Gallbladder:  Fundus of the gallbladder not visualized secondary to bowel gas. Negative sonographic Murphy sign. Visualized gallbladder wall not thickened. Uniformly anechoic fluid within the gallbladder with no stones or sludge identified. No pericholecystic fluid.  Common bile duct:  Diameter: 3 mm  Liver:  No focal lesion identified. Within normal limits in parenchymal echogenicity.  IMPRESSION: Sonographic survey negative for acute cholecystitis.  Signed,  Dulcy Fanny. Earleen Newport, DO  [facility]  [facility]   Electronically Signed   By: Corrie Mckusick D.O.   On: 09/03/2015 11:06    Microbiology: Recent Results (from the past 240 hour(s))  Culture, respiratory (NON-Expectorated)     Status: None   Collection Time: 08/28/15 10:54 AM  Result Value Ref Range Status   Specimen Description TRACHEAL ASPIRATE  Final   Special Requests NONE  Final   Gram Stain   Final    FEW WBC  PRESENT,BOTH PMN AND MONONUCLEAR RARE SQUAMOUS EPITHELIAL CELLS PRESENT YEAST Performed at [facility]    Culture   Final    FEW CANDIDA ALBICANS Performed at [facility]    Report Status 08/30/2015 FINAL  Final     Labs: Basic Metabolic Panel:  Recent Labs Lab 08/31/15 2341 09/01/15 0543 09/02/15 0448 09/03/15 0625 09/04/15 0633  NA 144 145 144 143 143  K 2.9* 3.0* 3.3* 3.5 3.8  CL 107 107 109 111 110  CO2 30 30 28 27 26   GLUCOSE 152* 112* 98 93 105*  BUN 34* 31* 25* 19 17  CREATININE 0.60* 0.57* 0.54* 0.55* 0.50*  CALCIUM 8.4* 8.5* 8.5* 8.0* 8.2*  MG  --  2.3  --  2.2  --   PHOS  --  2.6  --   --   --    Liver Function Tests:  Recent Labs Lab 09/01/15 0543 09/02/15 0448 09/03/15 0625 09/04/15 0633  AST 75* 252* 129* 77*  ALT 121* 265* 234* 195*  ALKPHOS 110 176* 174* 167*  BILITOT 0.8 0.9 0.5 0.5  PROT 5.3* 5.3* 4.9* 5.0*  ALBUMIN 2.1* 2.2* 2.0* 2.1*   No results for input(s):  LIPASE, AMYLASE in the last 168 hours. No results for input(s): AMMONIA in the last 168 hours. CBC:  Recent Labs Lab 08/30/15 0552 08/31/15 0535 09/01/15 0543 09/02/15 0448 09/03/15 0625  WBC 18.1* 22.8* 17.9* 15.0* 12.8*  NEUTROABS  --   --  15.9*  --  10.9*  HGB 7.9* 10.4* 9.4* 9.1* 9.3*  HCT 25.1* 31.5* 28.5* 27.8* 28.3*  MCV 91.6 88.2 88.2 88.8 88.4  PLT 359 358 310 327 312   Cardiac Enzymes: No results for input(s): CKTOTAL, CKMB, CKMBINDEX, TROPONINI in the last 168 hours. BNP: BNP (last 3 results)  Recent Labs  08/21/15 1725  BNP 86.7    ProBNP (last 3 results) No results for input(s): PROBNP in the last 8760 hours.  CBG:  Recent Labs Lab 09/04/15 1223 09/04/15 2045 09/05/15 0039 09/05/15 0435 09/05/15 0806  GLUCAP 119* 125* 89 84 78        Signed:  HONGALGI,ANAND, MD, FACP, FHM. [facility] Pager [phone]  If 7PM-7AM, please contact night-coverage [email or web address] Password [facility] 09/05/2015, 10:55 AM

## 2015-09-05 NOTE — Clinical Social Work Placement (Signed)
   CLINICAL SOCIAL WORK PLACEMENT  NOTE  Date:  09/05/2015  Patient Details  Name: Ethan Stewart MRN: [id number] Date of Birth: [birth year]  Clinical Social Work is seeking post-discharge placement for this patient at the [place] level of care (*CSW will initial, date and re-position this form in  chart as items are completed):  Yes   Patient/family provided with [place] Work Department's list of facilities offering this level of care within the geographic area requested by the patient (or if unable, by the patient's family).  Yes   Patient/family informed of their freedom to choose among providers that offer the needed level of care, that participate in Medicare, Medicaid or managed care program needed by the patient, have an available bed and are willing to accept the patient.  Yes   Patient/family informed of [facility]'s ownership interest in [facility], [facility] and [facility], as well as of the fact that they are under no obligation to receive care at these facilities.  PASRR submitted to EDS on 09/04/15     PASRR number received on 09/04/15     Existing PASRR number confirmed on       FL2 transmitted to all facilities in geographic area requested by pt/family on 09/04/15     FL2 transmitted to all facilities within larger geographic area on       Patient informed that his/her managed care company has contracts with or will negotiate with certain facilities, including the following:        Yes   Patient/family informed of bed offers received.  Patient chooses bed at [facility]     Physician recommends and patient chooses bed at      Patient to be transferred to [facility] on 09/05/15.  Patient to be transferred to facility by PTAR     Patient family notified on 09/05/15 of transfer.  Name of family member notified:  Wife at bedside     PHYSICIAN       Additional Comment:     _______________________________________________ Raymondo Band, LCSW 09/05/2015, 2:18 PM

## 2015-09-05 NOTE — Progress Notes (Signed)
Speech Language Pathology Treatment: Dysphagia  Patient Details Name: Ethan Stewart MRN: [id number] DOB: August 22, 1946 Today's Date: 09/05/2015 Time: 7915-0569 SLP Time Calculation (min) (ACUTE ONLY): 27 min  Assessment / Plan / Recommendation Clinical Impression  Pt with good po intake and tolerance per his and spouse report.  SLP reviewed MBS finding and clinical reasoning for compensation strategies.   Wife admits pt has not been following solids with liquids but has adhered to other precautions.  Advised pt to possible weakness with plan for chemo again and recommendation for adherence to swallow precautions.  Did not observe pt with po intake due to poor positioning in bed and RN needing to change his dressing.  Using teach back, demonstration and return demonstration, reviewed exercises to maximize laryngeal elevation and swallow rehabilitation including lingual press and effortful swallow.  Pt is very motivated to improve and is very committed to exercises.    Pt is afebrile, on room air and intake listed as 100%.  No acute follow up needed but recommend SLP for dysphagia tx at SNF.  Family and pt aware and agreeable. Thanks for allowing me to assist with this pt's care plan.    HPI Other Pertinent Information: 69 y.o. male with a history of Diffuse Large B-Cell Lymphoma diagnosed 07/2015 on R-CHOP Chemo Rx, GERD, bone cancer, cervical spine tumor, bleeding ulcers admitted with complaints of fevers and chills and cough with SOB x 2 days. Pt underwent s/p C5 corpectomy with C4-C6 arthrodesis and anterior cervical plating of C4-C6 8/13. Intubated 8/30-9/6. CXR 9/5 progressive opacification and volume loss in the right lower lobe concerning for increasing atelectasis versus developing infiltrate and repeat CXR 9/7 there is subcutaneous air, primarily on the right, but no pneumothorax appreciable. Patchy airspace disease in the bases is stable. No new opacity. No change in cardiac silhouette. RN reported  coughing after ice chips and BSE obtained yesterday with pt being placed on modified diet.  Today pt seen to inform his/wife of this SLP recommendation for MBS due to mulitple risk factors for silent nature of dysphagia.  Pt started on a dysphagia diet yesterday and today follow up indicated to assure tolerance and provide exercises to improve swallow function.    Pertinent Vitals Pain Assessment: No/denies pain  SLP Plan  All goals met (follow up at SNF indicated for dysphagia management)    Recommendations Diet recommendations: Dysphagia 3 (mechanical soft);Thin liquid Medication Administration: Whole meds with puree Supervision: Intermittent supervision to cue for compensatory strategies Compensations: Small sips/bites;Follow solids with liquid;Clear throat intermittently;Effortful swallow Postural Changes and/or Swallow Maneuvers: Seated upright 90 degrees;Upright 30-60 min after meal              Oral Care Recommendations: Oral care BID Follow up Recommendations: Skilled Nursing facility Plan: All goals met (follow up at SNF indicated for dysphagia management)    [place], [facility] SLP [phone]

## 2015-09-05 NOTE — Progress Notes (Signed)
Called [facility] and spoke with [place]. Gave report and answered all questions. RN had no questions nor concerns. Pt's vitals are WNL, tolerating diet and has no complaints of pain. PTR to p/u around 1600.

## 2015-09-05 NOTE — Patient Outreach (Signed)
[facility] [facility]) Care Management  09/05/2015  Ethan Stewart May 13, 1946 [id number]   Referral from Marthenia Rolling, RN to assign LCSW, assigned [employer], LCSW.  Thanks, Ronnell [place]water. [place], [place] Assistant Phone: [phone] Fax: [phone]

## 2015-09-05 NOTE — Consult Note (Signed)
   [facility] CM Inpatient Consult   09/05/2015  Ethan Stewart 03-24-1946 [id number]   Patient evaluated for [facility] services on behalf of his [facility] Advantage insurance. Went to bedside speak with patient and wife. Confirm that Ethan Stewart is going to [facility] SNF today. States the plan is for short term rehab. Consents signed. Will request for [facility] Licensed CSW to follow up with patient at SNF. Will make inpatient Licensed CSW aware.   Marthenia Rolling, MSN-Ed, RN,BSN [facility] Liaison [phone]

## 2015-09-05 NOTE — Progress Notes (Signed)
Patient to be transported to [facility] via [facility]. Patient and wife notified of facility acceptance and transport. Patient and wife appreciative of services provided by CSW. CSW to call PTAR at 4:00pm. RN to call report. CSW did receive authorization for SNF #[id number].   Lucius Conn, [place] Social Worker [place] [phone]

## 2015-09-06 ENCOUNTER — Telehealth: Payer: Self-pay | Admitting: Hematology

## 2015-09-06 ENCOUNTER — Telehealth: Payer: Self-pay | Admitting: *Deleted

## 2015-09-06 NOTE — Telephone Encounter (Signed)
Per patient request, I have moved appts

## 2015-09-06 NOTE — Telephone Encounter (Signed)
pt cld to move appt to later time-gave updated time & date

## 2015-09-06 NOTE — Telephone Encounter (Signed)
Ethan Stewart that michalle in inf stated pt needs to be in trmt room by 1:30-Lashonyastatedshe she will let Delle Reining know-adv this was for 9/19

## 2015-09-07 ENCOUNTER — Encounter: Payer: Self-pay | Admitting: *Deleted

## 2015-09-07 ENCOUNTER — Other Ambulatory Visit: Payer: Self-pay | Admitting: *Deleted

## 2015-09-07 ENCOUNTER — Other Ambulatory Visit: Payer: PPO

## 2015-09-07 NOTE — Patient Outreach (Signed)
[facility]) Care Management  [facility] Social Work  09/07/2015  Ethan Stewart 1946-05-23 [id number]  Subjective:    "I guess I will need someone to help me decide what I need when I go back home".   Objective:   CSW agreed to assist patient with arranging for discharge planning needs and services (home health and durable medical equipment), upon release from [place] [place] where patient currently resides to receive rehabilitative services.  Current Medications:  Current Outpatient Prescriptions  Medication Sig Dispense Refill  . acetaminophen (TYLENOL) 325 MG tablet Take 2 tablets (650 mg total) by mouth every 6 (six) hours as needed for mild pain, moderate pain, fever or headache (or Fever >/= 101).    [name] Kitchen allopurinol (ZYLOPRIM) 300 MG tablet Take 1 tablet (300 mg total) by mouth daily. 30 tablet 3  . Amino Acids-Protein Hydrolys (FEEDING SUPPLEMENT, PRO-STAT SUGAR FREE 64,) LIQD Take 30 mLs by mouth 2 (two) times daily.    [name] Kitchen lidocaine-prilocaine (EMLA) cream Apply to affected area once 30 g 3  . Maltodextrin-Xanthan Gum (RESOURCE THICKENUP CLEAR) POWD Oral, As needed, other,    . Multiple Vitamin (MULTIVITAMIN WITH MINERALS) TABS tablet Take 1 tablet by mouth daily.    [name] Kitchen neomycin-bacitracin-polymyxin (NEOSPORIN) ointment Apply 1 application topically as needed for wound care (left foot blister). apply to eye    . omeprazole (PRILOSEC) 40 MG capsule Take 1 capsule (40 mg total) by mouth daily. 30 capsule 0  . ondansetron (ZOFRAN) 8 MG tablet Take 1 tablet (8 mg total) by mouth 2 (two) times daily. Start the day after chemo for 3 days. Then as needed for nausea or vomiting. 30 tablet 1  . PRESCRIPTION MEDICATION [facility] CHEMO    . prochlorperazine (COMPAZINE) 10 MG tablet Take 1 tablet (10 mg total) by mouth every 6 (six) hours as needed (Nausea or vomiting). 30 tablet 6   No current facility-administered medications for this visit.    Functional  Status:  In your present state of health, do you have any difficulty performing the following activities: 09/07/2015 08/21/2015  Hearing? N N  Vision? N N  Difficulty concentrating or making decisions? N N  Walking or climbing stairs? N N  Dressing or bathing? N N  Doing errands, shopping? N N  Preparing Food and eating ? N -  Using the Toilet? N -  In the past six months, have you accidently leaked urine? N -  Do you have problems with loss of bowel control? N -  Managing your Medications? N -  Managing your Finances? N -  Housekeeping or managing your Housekeeping? Y -    Fall/Depression Screening:  PHQ 2/9 Scores 09/07/2015  PHQ - 2 Score 3  PHQ- 9 Score 8    Assessment:   CSW was able to meet with patient today at [facility] [place] where patient currently resides to receive short-term rehabilitative services, to perform the initial assessment.  CSW was also able to access patient's needs, prior to returning home to live with wife, Raistlin Gum.  CSW introduced self, explained role and types of services provided through [facility] ([facility]).  CSW further explained to patient that CSW works with patient's Primary Care Physician, Dr. Lona Kettle, to ensure that patient's medical and psychosocial needs are met on an outpatient basis.  CSW then explained the reason for the visit, indicating that CSW wants to assist with arranging discharge services for patient, upon release  from [facility], to ensure that patient has everything he needs to be successful at home and continue to live independently.  CSW obtained two HIPAA compliant identifiers from patient, which included patient's name and date of birth. At present, patient admitted that he is unsure of what his needs will be upon returning home.  CSW agreed to follow patient while at [facility] to track progress, as well as converse with patient's physical therapist, occupational  therapist and discharge planning coordinator.  CSW also agreed to attend patient's discharge planning meeting, scheduled for Thursday, September 29th at 10:00am.  CSW obtained verbal consent from patient to converse with Mrs. [name] regarding patient's discharge disposition, as patient is adamant about returning home to live, as opposed to long-term care placement arrangements into an assisted living facility.     Plan:   CSW will prescribe and print EMMI information to review with patient at the next scheduled visit. CSW will make arrangements to attend patient's discharge planning meeting at [facility] [place] where patient currently resides to receive short-term rehabilitative services, scheduled for Thursday, September 29th at 10:00am CSW will fax a correspondence letter to patient's Primary Care Physician, Dr. Lona Kettle to ensure that Dr. Harrington Challenger is aware of CSW's involvement with patient.  Nat Christen, BSW, MSW, LCSW  Licensed Education officer, environmental Health System  Mailing [place] [address] [place], [place] [address] Physical Address-[address], [place], [place] [address] Toll Free Main # [phone] Fax # [phone] Cell # [phone]  Fax # [phone]  [email or web address]

## 2015-09-07 NOTE — Patient Outreach (Signed)
[facility]) Care Management  09/07/2015  KRISTION HOLIFIELD 09/13/1946 [id number]   Erroneous Encounter.  Nat Christen, BSW, MSW, LCSW  Licensed Education officer, environmental Health System  Mailing [place] [address] [place], [place] [address] Physical Address-[address], [place], [place] [address] Toll Free Main # [phone] Fax # [phone] Cell # [phone]  Fax # [phone]  [email or web address]

## 2015-09-08 ENCOUNTER — Telehealth: Payer: Self-pay | Admitting: *Deleted

## 2015-09-08 NOTE — Telephone Encounter (Signed)
PT. IS IN A REHAB CENTER AND IS NOT ACTING LIKE HIMSELF. HE IS NOT MOTIVATED. PT.'S WIFE THINKS IT COULD BE SOMETHING WITH HER HUSBAND'S MEDICATIONS. SHE WANTS DR.KALE TO CHECK ON THIS SITUATION.

## 2015-09-08 NOTE — Telephone Encounter (Signed)
Will call wife and inform MD Irene Limbo

## 2015-09-11 ENCOUNTER — Telehealth: Payer: Self-pay | Admitting: Hematology

## 2015-09-11 ENCOUNTER — Other Ambulatory Visit: Payer: Self-pay

## 2015-09-11 ENCOUNTER — Encounter: Payer: Self-pay | Admitting: Hematology

## 2015-09-11 ENCOUNTER — Telehealth: Payer: Self-pay | Admitting: *Deleted

## 2015-09-11 ENCOUNTER — Other Ambulatory Visit (HOSPITAL_BASED_OUTPATIENT_CLINIC_OR_DEPARTMENT_OTHER): Payer: PPO

## 2015-09-11 ENCOUNTER — Ambulatory Visit (HOSPITAL_BASED_OUTPATIENT_CLINIC_OR_DEPARTMENT_OTHER): Payer: PPO | Admitting: Hematology

## 2015-09-11 ENCOUNTER — Ambulatory Visit: Payer: Self-pay | Admitting: Hematology

## 2015-09-11 ENCOUNTER — Ambulatory Visit (HOSPITAL_BASED_OUTPATIENT_CLINIC_OR_DEPARTMENT_OTHER): Payer: PPO

## 2015-09-11 VITALS — BP 128/96 | HR 78 | Temp 98.3°F | Resp 18

## 2015-09-11 VITALS — BP 116/82 | HR 113 | Temp 98.1°F | Resp 18 | Ht 73.0 in | Wt 188.3 lb

## 2015-09-11 DIAGNOSIS — C8330 Diffuse large B-cell lymphoma, unspecified site: Secondary | ICD-10-CM | POA: Diagnosis not present

## 2015-09-11 DIAGNOSIS — M6250 Muscle wasting and atrophy, not elsewhere classified, unspecified site: Secondary | ICD-10-CM

## 2015-09-11 DIAGNOSIS — Z5111 Encounter for antineoplastic chemotherapy: Secondary | ICD-10-CM | POA: Diagnosis not present

## 2015-09-11 DIAGNOSIS — D6481 Anemia due to antineoplastic chemotherapy: Secondary | ICD-10-CM | POA: Diagnosis not present

## 2015-09-11 DIAGNOSIS — Z5112 Encounter for antineoplastic immunotherapy: Secondary | ICD-10-CM | POA: Diagnosis not present

## 2015-09-11 DIAGNOSIS — T451X5A Adverse effect of antineoplastic and immunosuppressive drugs, initial encounter: Secondary | ICD-10-CM

## 2015-09-11 DIAGNOSIS — C8338 Diffuse large B-cell lymphoma, lymph nodes of multiple sites: Secondary | ICD-10-CM

## 2015-09-11 DIAGNOSIS — E46 Unspecified protein-calorie malnutrition: Secondary | ICD-10-CM

## 2015-09-11 LAB — CBC & DIFF AND RETIC
BASO%: 1 % (ref 0.0–2.0)
BASOS ABS: 0.1 10*3/uL (ref 0.0–0.1)
EOS ABS: 0.3 10*3/uL (ref 0.0–0.5)
EOS%: 3.9 % (ref 0.0–7.0)
HEMATOCRIT: 30.9 % — AB (ref 38.4–49.9)
HEMOGLOBIN: 10 g/dL — AB (ref 13.0–17.1)
Immature Retic Fract: 13.2 % — ABNORMAL HIGH (ref 3.00–10.60)
LYMPH%: 10.3 % — AB (ref 14.0–49.0)
MCH: 28.3 pg (ref 27.2–33.4)
MCHC: 32.4 g/dL (ref 32.0–36.0)
MCV: 87.5 fL (ref 79.3–98.0)
MONO#: 0.6 10*3/uL (ref 0.1–0.9)
MONO%: 9.2 % (ref 0.0–14.0)
NEUT#: 5.1 10*3/uL (ref 1.5–6.5)
NEUT%: 75.6 % — ABNORMAL HIGH (ref 39.0–75.0)
Platelets: 290 10*3/uL (ref 140–400)
RBC: 3.53 10*6/uL — ABNORMAL LOW (ref 4.20–5.82)
RDW: 16.7 % — AB (ref 11.0–14.6)
RETIC %: 2.23 % — AB (ref 0.80–1.80)
Retic Ct Abs: 78.72 10*3/uL (ref 34.80–93.90)
WBC: 6.7 10*3/uL (ref 4.0–10.3)
lymph#: 0.7 10*3/uL — ABNORMAL LOW (ref 0.9–3.3)

## 2015-09-11 LAB — COMPREHENSIVE METABOLIC PANEL (CC13)
ALBUMIN: 2.2 g/dL — AB (ref 3.5–5.0)
ALK PHOS: 146 U/L (ref 40–150)
ALT: 55 U/L (ref 0–55)
ANION GAP: 9 meq/L (ref 3–11)
AST: 24 U/L (ref 5–34)
BUN: 8.2 mg/dL (ref 7.0–26.0)
CALCIUM: 8.8 mg/dL (ref 8.4–10.4)
CHLORIDE: 110 meq/L — AB (ref 98–109)
CO2: 22 mEq/L (ref 22–29)
Creatinine: 0.6 mg/dL — ABNORMAL LOW (ref 0.7–1.3)
Glucose: 121 mg/dl (ref 70–140)
POTASSIUM: 3.5 meq/L (ref 3.5–5.1)
Sodium: 141 mEq/L (ref 136–145)
Total Bilirubin: 0.4 mg/dL (ref 0.20–1.20)
Total Protein: 5.7 g/dL — ABNORMAL LOW (ref 6.4–8.3)

## 2015-09-11 LAB — MAGNESIUM (CC13): MAGNESIUM: 2 mg/dL (ref 1.5–2.5)

## 2015-09-11 LAB — LACTATE DEHYDROGENASE (CC13): LDH: 354 U/L — AB (ref 125–245)

## 2015-09-11 MED ORDER — SENNOSIDES-DOCUSATE SODIUM 8.6-50 MG PO TABS: 2.0000 | ORAL_TABLET | Freq: Every day | ORAL | Status: DC

## 2015-09-11 MED ORDER — SODIUM CHLORIDE 0.9 % IV SOLN
Freq: Once | INTRAVENOUS | Status: AC
  Administered 2015-09-11: 11:00:00 via INTRAVENOUS

## 2015-09-11 MED ORDER — VINCRISTINE SULFATE CHEMO INJECTION 1 MG/ML
2.0000 mg | Freq: Once | INTRAVENOUS | Status: AC
  Administered 2015-09-11: 2 mg via INTRAVENOUS
  Filled 2015-09-11: qty 2

## 2015-09-11 MED ORDER — ACETAMINOPHEN 325 MG PO TABS
650.0000 mg | ORAL_TABLET | Freq: Once | ORAL | Status: AC
  Administered 2015-09-11: 650 mg via ORAL

## 2015-09-11 MED ORDER — DOXORUBICIN HCL CHEMO IV INJECTION 2 MG/ML
25.0000 mg/m2 | Freq: Once | INTRAVENOUS | Status: AC
  Administered 2015-09-11: 56 mg via INTRAVENOUS
  Filled 2015-09-11: qty 28

## 2015-09-11 MED ORDER — SODIUM CHLORIDE 0.9 % IV SOLN
375.0000 mg/m2 | Freq: Once | INTRAVENOUS | Status: AC
  Administered 2015-09-11: 800 mg via INTRAVENOUS
  Filled 2015-09-11: qty 80

## 2015-09-11 MED ORDER — PREDNISONE 20 MG PO TABS: 60.0000 mg | ORAL_TABLET | Freq: Every day | ORAL | Status: DC

## 2015-09-11 MED ORDER — DIPHENHYDRAMINE HCL 25 MG PO CAPS
ORAL_CAPSULE | ORAL | Status: AC
  Filled 2015-09-11: qty 2

## 2015-09-11 MED ORDER — MIRTAZAPINE 15 MG PO TABS: 15.0000 mg | ORAL_TABLET | Freq: Every day | ORAL | Status: DC

## 2015-09-11 MED ORDER — SENNOSIDES-DOCUSATE SODIUM 8.6-50 MG PO TABS
2.0000 | ORAL_TABLET | Freq: Every day | ORAL | Status: DC
Start: 2015-09-11 — End: 2015-09-11

## 2015-09-11 MED ORDER — ACETAMINOPHEN 325 MG PO TABS
ORAL_TABLET | ORAL | Status: AC
  Filled 2015-09-11: qty 2

## 2015-09-11 MED ORDER — DIPHENHYDRAMINE HCL 25 MG PO CAPS
50.0000 mg | ORAL_CAPSULE | Freq: Once | ORAL | Status: AC
  Administered 2015-09-11: 50 mg via ORAL

## 2015-09-11 MED ORDER — SODIUM CHLORIDE 0.9 % IV SOLN
Freq: Once | INTRAVENOUS | Status: AC
  Administered 2015-09-11: 11:00:00 via INTRAVENOUS
  Filled 2015-09-11: qty 8

## 2015-09-11 MED ORDER — SODIUM CHLORIDE 0.9 % IV SOLN
400.0000 mg/m2 | Freq: Once | INTRAVENOUS | Status: AC
  Administered 2015-09-11: 880 mg via INTRAVENOUS
  Filled 2015-09-11: qty 44

## 2015-09-11 MED ORDER — TEMAZEPAM 15 MG PO CAPS: 15.0000 mg | ORAL_CAPSULE | Freq: Every evening | ORAL | Status: DC | PRN

## 2015-09-11 MED ORDER — OXANDROLONE 2.5 MG PO TABS: 5.0000 mg | ORAL_TABLET | Freq: Two times a day (BID) | ORAL | Status: DC

## 2015-09-11 NOTE — Telephone Encounter (Signed)
I have adjusted 10/10 appt

## 2015-09-11 NOTE — Telephone Encounter (Signed)
Appointments complete per 9/19 pof. Patient will receive new schedule in inf area. Message to chemo scheduler to move 10/10 tx closer to f/u. Message to desk nurse re what imaging study does patient need 1-2 days prior to 3 week tx - no new imaging orders.

## 2015-09-11 NOTE — Patient Instructions (Signed)
[facility] Discharge Instructions for Patients Receiving Chemotherapy  Today you received the following chemotherapy agents: Rituxan, Adriamycin, vincristine, Cytoxan  To help prevent nausea and vomiting after your treatment, we encourage you to take your nausea medication: Compazine 10 mg every 6 hours as needed, Zofran 8 mg every 12 hours-start the day after chemo for 3 days.   If you develop nausea and vomiting that is not controlled by your nausea medication, call the clinic.   BELOW ARE SYMPTOMS THAT SHOULD BE REPORTED IMMEDIATELY:  *FEVER GREATER THAN 100.5 F  *CHILLS WITH OR WITHOUT FEVER  NAUSEA AND VOMITING THAT IS NOT CONTROLLED WITH YOUR NAUSEA MEDICATION  *UNUSUAL SHORTNESS OF BREATH  *UNUSUAL BRUISING OR BLEEDING  TENDERNESS IN MOUTH AND THROAT WITH OR WITHOUT PRESENCE OF ULCERS  *URINARY PROBLEMS  *BOWEL PROBLEMS  UNUSUAL RASH Items with * indicate a potential emergency and should be followed up as soon as possible.  Feel free to call the clinic you have any questions or concerns. The clinic phone number is [phone].  Please show the [place] at check-in to the Emergency Department and triage nurse.

## 2015-09-13 ENCOUNTER — Ambulatory Visit: Payer: Self-pay

## 2015-09-14 ENCOUNTER — Other Ambulatory Visit: Payer: PPO

## 2015-09-17 NOTE — Progress Notes (Signed)
Ethan Stewart IT Methotrexate + Hydrocortisone CNS prophylaxis shall be held cycle 2. Will plan to proceed with this in cycle 3 if all is stable.

## 2015-09-17 NOTE — Progress Notes (Signed)
[name]  HEMATOLOGY ONCOLOGY PROGRESS NOTE Date of service 09/11/2015  Patient Care Team: Lona Kettle, MD as PCP - General (Family Medicine) Brunetta Genera, MD as Consulting Physician (Hematology) Francis Gaines, LCSW as [place] [facility] (Licensed Clinical Social Worker)  SUMMARY OF ONCOLOGIC HISTORY:   DLBCL (diffuse large B cell lymphoma)   06/21/2015 Imaging CT Chest/abd/pelvis: IMPRESSION: 5 mm nodule within the right lower lobe as described.  Pneumobilia consistent with a prior sphincterotomy.  No acute abnormality is identified. No findings to suggest chest etiology for the known lesion at C5 are seen.   06/21/2015 Imaging MRI c spine1. Diffuse marrow replacement of the C5 vertebral body highly concerning for neoplasm (metastatic disease, myeloma, or lymphoma). Epidural tumor at this level results in moderate spinal stenosis with moderate impression on the spinal cord    07/10/2015 PET scan 1. The previously demonstrated abnormal C5 vertebral body is hypermetabolic. No other osseous lesions demonstrated. 2. There are small hypermetabolic lymph nodes within the left mesentery and both inguinal regions.    07/26/2015 Initial Biopsy Had a C5 corpectomy pathology showed diffuse large B-cell lymphoma   08/08/2015 -  Chemotherapy Started for cycle of R CHOP.    08/09/2015 -  Chemotherapy Received first cycle of intrathecal methotrexate plus hydrocortisone for CNS prophylaxis.   Diagnosis: Diffuse large B-cell lymphoma Stage IVAE with extranodal involvement of C5 vertebra status post C5 corpectomy   INTERVAL HISTORY:  Ethan Stewart is here for his scheduled post hospitalization followup. He is currently rehabilitating at SNF. Notes some improvement with PT. Still with healing decubitus ulcer. Feeling somewhat discouraged but brighter with encouragement. 2nd cycle of R-CHOP delayed due to hospitalization for shortness of breah and ARDS likely related to Neulasta. Discussed  with Ethan Stewart and his wife. 2nd cycle dose reduced to R-mini CHOP to allow for Ethan Stewart to rehabilitate. Patient having issues with insomnia and poor appetite.  REVIEW OF SYSTEMS:   10 point review of systems was done and is negative except as noted above  I have reviewed the past medical history, past surgical history, social history and family history with the patient and they are unchanged from previous note.  ALLERGIES:  is allergic to neulasta.  MEDICATIONS:  Current Outpatient Prescriptions  Medication Sig Dispense Refill  . acetaminophen (TYLENOL) 325 MG tablet Take 2 tablets (650 mg total) by mouth every 6 (six) hours as needed for mild pain, moderate pain, fever or headache (or Fever >/= 101).    [name] Kitchen lidocaine-prilocaine (EMLA) cream Apply to affected area once 30 g 3  . Maltodextrin-Xanthan Gum (RESOURCE THICKENUP CLEAR) POWD Oral, As needed, other,    . Multiple Vitamin (MULTIVITAMIN WITH MINERALS) TABS tablet Take 1 tablet by mouth daily.    [name] Kitchen neomycin-bacitracin-polymyxin (NEOSPORIN) ointment Apply 1 application topically as needed for wound care (left foot blister). apply to eye    . omeprazole (PRILOSEC) 40 MG capsule Take 1 capsule (40 mg total) by mouth daily. 30 capsule 0  . ondansetron (ZOFRAN) 8 MG tablet Take 1 tablet (8 mg total) by mouth 2 (two) times daily. Start the day after chemo for 3 days. Then as needed for nausea or vomiting. 30 tablet 1  . oxandrolone (OXANDRIN) 2.5 MG tablet Take 2 tablets (5 mg total) by mouth 2 (two) times daily. 60 tablet 0  . PRESCRIPTION MEDICATION [facility] CHEMO    . prochlorperazine (COMPAZINE) 10 MG tablet Take 1 tablet (10 mg total) by mouth every 6 (six) hours as needed (  Nausea or vomiting). 30 tablet 6  . senna-docusate (SENNA S) 8.6-50 MG per tablet Take 2 tablets by mouth at bedtime. 60 tablet 1  . temazepam (RESTORIL) 15 MG capsule Take 1 capsule (15 mg total) by mouth at bedtime as needed for sleep. 60 capsule 0  . mirtazapine (REMERON) 15 MG  tablet Take 1 tablet (15 mg total) by mouth at bedtime. Might increase to 7m po HS in 7 days if still having insomnia 60 tablet 1   No current facility-administered medications for this visit.    PHYSICAL EXAMINATION: ECOG PERFORMANCE STATUS: 1 - Symptomatic but completely ambulatory  Filed Vitals:   09/11/15 0842  BP: 116/82  Pulse: 113  Temp: 98.1 F (36.7 C)  Resp: 18   Filed Weights   09/11/15 0842  Weight: 188 lb 4.8 oz (85.412 kg)    GENERAL:alert, no distress and comfortable SKIN: sacral grade 2-3 decubitus ulcer. No significant discharge. Some surrounding skin maceration. EYES: normal, Conjunctiva are pink and non-injected, sclera clear OROPHARYNX:no exudate, no erythema and lips, buccal mucosa, and tongue normal  NECK: supple, thyroid normal size, non-tender, without nodularity LYMPH: small palpable left inguinal adenopathy. LUNGS: clear to auscultation and percussion with normal breathing effort HEART: regular rate & rhythm and no murmurs and no lower extremity edema ABDOMEN:abdomen soft, non-tender and normal bowel sounds Musculoskeletal:no cyanosis of digits and no clubbing  NEURO: alert & oriented x 3 with fluent speech, 4-/5 weakness right upper extremity abduction  LABORATORY DATA:   . CBC Latest Ref Rng 09/11/2015 09/03/2015 09/02/2015  WBC 4.0 - 10.3 10e3/uL 6.7 12.8(H) 15.0(H)  Hemoglobin 13.0 - 17.1 g/dL 10.0(L) 9.3(L) 9.1(L)  Hematocrit 38.4 - 49.9 % 30.9(L) 28.3(L) 27.8(L)  Platelets 140 - 400 10e3/uL 290 312 327    . CMP Latest Ref Rng 09/11/2015 09/04/2015 09/03/2015  Glucose 70 - 140 mg/dl 121 105(H) 93  BUN 7.0 - 26.0 mg/dL 8.'2 17 19  ' Creatinine 0.7 - 1.3 mg/dL 0.6(L) 0.50(L) 0.55(L)  Sodium 136 - 145 mEq/L 141 143 143  Potassium 3.5 - 5.1 mEq/L 3.5 3.8 3.5  Chloride 101 - 111 mmol/L - 110 111  CO2 22 - 29 mEq/L '22 26 27  ' Calcium 8.4 - 10.4 mg/dL 8.8 8.2(L) 8.0(L)  Total Protein 6.4 - 8.3 g/dL 5.7(L) 5.0(L) 4.9(L)  Total Bilirubin 0.20 - 1.20  mg/dL 0.40 0.5 0.5  Alkaline Phos 40 - 150 U/L 146 167(H) 174(H)  AST 5 - 34 U/L 24 77(H) 129(H)  ALT 0 - 55 U/L 55 195(H) 234(H)   . Lab Results  Component Value Date   LDH 354* 09/11/2015    RADIOGRAPHIC STUDIES: I have personally reviewed the radiological images as listed and agreed with the findings in the report. No results found.   ASSESSMENT & PLAN:   1) Diffuse large B-cell lymphoma stage IV AE with involvement of mesenteric, inguinal lymph nodes and C5 vertebral body with spinal cord impingement.  He had a C5 corpectomy on 07/26/2015 that showed diffuse large B-cell lymphoma with a Ki-67 ranging from 20 to 90%. Plan Patient received first cycle of R CHOP on 08/08/2015 and intrathecal methotrexate with hydrocortisone on 08/09/2015. Patient subsequently was admitted with ARDS likely due to neulasta -treated with high dose steroids and empirically with broad spectrum antibiotics.  2nd cycle of chemotherapy delayed to allow for rehabilitation (was due on 08/31/2015) Currently in SNF rehablitating.  Plan -labs stable today - will dose reduce 2nd cycle to R-mini CHOP dosing -no G-CSF/neulasta due to  issues with ARDS -His port placement has been rescheduled due to his hospitalzation -PET/CT prior to cycle 3  2)Acid reflux and gastritis likely related to steroid use -controlled -Continue PPI  3) Decubitus Ulcer over sacral area 4) Debility due to recent hospitalization 5) C5 surgery - now off c -collar 6) s/ dysphagia - now resolved 7) Insomnia/Depression 8) Significant muscle loss due to steroids/hospitalization/debility Plan - will need aggressive wound cares - barrier ointment with hydrocolloid dressing -given wound clinic referral and continue wound cares at SNF -Started on Remeron and prn Temazepam -Oxandrolone for anabolic effect to try to build musle -aggressive ongoing participation with PT/OT -continue ongoing encouragement.  I spent 30 minutes counseling the  patient face to face. The total time spent in the appointment was 40 minutes and more than 50% was on counseling and direct patient cares.   Sullivan Lone MD [facility] Hematology/Oncology Physician [facility]  (Office):       [phone] (Work cell):  [phone] (Fax):           [phone]

## 2015-09-18 ENCOUNTER — Other Ambulatory Visit (HOSPITAL_BASED_OUTPATIENT_CLINIC_OR_DEPARTMENT_OTHER): Payer: PPO

## 2015-09-18 ENCOUNTER — Ambulatory Visit (HOSPITAL_BASED_OUTPATIENT_CLINIC_OR_DEPARTMENT_OTHER): Payer: PPO | Admitting: Nurse Practitioner

## 2015-09-18 ENCOUNTER — Other Ambulatory Visit: Payer: Self-pay | Admitting: *Deleted

## 2015-09-18 VITALS — BP 121/74 | HR 128 | Temp 98.6°F | Resp 19 | Wt 193.8 lb

## 2015-09-18 DIAGNOSIS — C8330 Diffuse large B-cell lymphoma, unspecified site: Secondary | ICD-10-CM

## 2015-09-18 DIAGNOSIS — E860 Dehydration: Secondary | ICD-10-CM

## 2015-09-18 DIAGNOSIS — C8338 Diffuse large B-cell lymphoma, lymph nodes of multiple sites: Secondary | ICD-10-CM

## 2015-09-18 DIAGNOSIS — E8809 Other disorders of plasma-protein metabolism, not elsewhere classified: Secondary | ICD-10-CM

## 2015-09-18 LAB — CBC & DIFF AND RETIC
BASO%: 0.1 % (ref 0.0–2.0)
Basophils Absolute: 0 10*3/uL (ref 0.0–0.1)
EOS ABS: 0.1 10*3/uL (ref 0.0–0.5)
EOS%: 1.8 % (ref 0.0–7.0)
HCT: 30.9 % — ABNORMAL LOW (ref 38.4–49.9)
HEMOGLOBIN: 10.1 g/dL — AB (ref 13.0–17.1)
IMMATURE RETIC FRACT: 27.1 % — AB (ref 3.00–10.60)
LYMPH%: 5.8 % — AB (ref 14.0–49.0)
MCH: 28.5 pg (ref 27.2–33.4)
MCHC: 32.7 g/dL (ref 32.0–36.0)
MCV: 87 fL (ref 79.3–98.0)
MONO#: 0.1 10*3/uL (ref 0.1–0.9)
MONO%: 1.7 % (ref 0.0–14.0)
NEUT%: 90.6 % — ABNORMAL HIGH (ref 39.0–75.0)
NEUTROS ABS: 6.5 10*3/uL (ref 1.5–6.5)
Platelets: 170 10*3/uL (ref 140–400)
RBC: 3.55 10*6/uL — ABNORMAL LOW (ref 4.20–5.82)
RDW: 16.3 % — AB (ref 11.0–14.6)
RETIC %: 1.03 % (ref 0.80–1.80)
Retic Ct Abs: 36.57 10*3/uL (ref 34.80–93.90)
WBC: 7.2 10*3/uL (ref 4.0–10.3)
lymph#: 0.4 10*3/uL — ABNORMAL LOW (ref 0.9–3.3)

## 2015-09-18 LAB — COMPREHENSIVE METABOLIC PANEL (CC13)
ALBUMIN: 2.7 g/dL — AB (ref 3.5–5.0)
ALK PHOS: 107 U/L (ref 40–150)
ALT: 24 U/L (ref 0–55)
AST: 15 U/L (ref 5–34)
Anion Gap: 9 mEq/L (ref 3–11)
BILIRUBIN TOTAL: 0.61 mg/dL (ref 0.20–1.20)
BUN: 9.4 mg/dL (ref 7.0–26.0)
CO2: 27 mEq/L (ref 22–29)
CREATININE: 0.7 mg/dL (ref 0.7–1.3)
Calcium: 9.1 mg/dL (ref 8.4–10.4)
Chloride: 103 mEq/L (ref 98–109)
EGFR: >90 mL/min/{1.73_m2} (ref >90)
GLUCOSE: 107 mg/dL (ref 70–140)
Potassium: 4.2 mEq/L (ref 3.5–5.1)
SODIUM: 140 meq/L (ref 136–145)
TOTAL PROTEIN: 6 g/dL — AB (ref 6.4–8.3)

## 2015-09-18 MED ORDER — ALBUMIN HUMAN 25 % IV SOLN
12.5000 g | Freq: Once | INTRAVENOUS | Status: AC
Start: 2015-09-18 — End: 2015-09-18
  Administered 2015-09-18: 12.5 g via INTRAVENOUS
  Filled 2015-09-18: qty 50

## 2015-09-18 MED ORDER — SODIUM CHLORIDE 0.9 % IV SOLN
1000.0000 mL | Freq: Once | INTRAVENOUS | Status: AC
  Administered 2015-09-18: 1000 mL via INTRAVENOUS

## 2015-09-19 ENCOUNTER — Telehealth: Payer: Self-pay | Admitting: Hematology

## 2015-09-19 ENCOUNTER — Inpatient Hospital Stay (HOSPITAL_COMMUNITY)
Admission: EM | Admit: 2015-09-19 | Discharge: 2015-09-22 | DRG: 602 | Disposition: A | Discharge status: Skilled Nursing Facility | Payer: Non-veteran care | Attending: Internal Medicine | Admitting: Internal Medicine

## 2015-09-19 ENCOUNTER — Encounter (HOSPITAL_COMMUNITY): Payer: Self-pay

## 2015-09-19 ENCOUNTER — Encounter: Payer: Self-pay | Admitting: Nurse Practitioner

## 2015-09-19 ENCOUNTER — Emergency Department (HOSPITAL_COMMUNITY): Payer: Non-veteran care

## 2015-09-19 DIAGNOSIS — L03115 Cellulitis of right lower limb: Principal | ICD-10-CM

## 2015-09-19 DIAGNOSIS — E43 Unspecified severe protein-calorie malnutrition: Secondary | ICD-10-CM | POA: Diagnosis present

## 2015-09-19 DIAGNOSIS — R5081 Fever presenting with conditions classified elsewhere: Secondary | ICD-10-CM

## 2015-09-19 DIAGNOSIS — F332 Major depressive disorder, recurrent severe without psychotic features: Secondary | ICD-10-CM | POA: Diagnosis not present

## 2015-09-19 DIAGNOSIS — E8809 Other disorders of plasma-protein metabolism, not elsewhere classified: Secondary | ICD-10-CM | POA: Insufficient documentation

## 2015-09-19 DIAGNOSIS — L89159 Pressure ulcer of sacral region, unspecified stage: Secondary | ICD-10-CM | POA: Diagnosis present

## 2015-09-19 DIAGNOSIS — L8990 Pressure ulcer of unspecified site, unspecified stage: Secondary | ICD-10-CM | POA: Diagnosis not present

## 2015-09-19 DIAGNOSIS — Z8249 Family history of ischemic heart disease and other diseases of the circulatory system: Secondary | ICD-10-CM

## 2015-09-19 DIAGNOSIS — T451X5A Adverse effect of antineoplastic and immunosuppressive drugs, initial encounter: Secondary | ICD-10-CM | POA: Diagnosis present

## 2015-09-19 DIAGNOSIS — G4700 Insomnia, unspecified: Secondary | ICD-10-CM | POA: Diagnosis present

## 2015-09-19 DIAGNOSIS — Z66 Do not resuscitate: Secondary | ICD-10-CM | POA: Diagnosis present

## 2015-09-19 DIAGNOSIS — E860 Dehydration: Secondary | ICD-10-CM | POA: Insufficient documentation

## 2015-09-19 DIAGNOSIS — L0390 Cellulitis, unspecified: Secondary | ICD-10-CM | POA: Diagnosis not present

## 2015-09-19 DIAGNOSIS — K5900 Constipation, unspecified: Secondary | ICD-10-CM | POA: Diagnosis present

## 2015-09-19 DIAGNOSIS — C7951 Secondary malignant neoplasm of bone: Secondary | ICD-10-CM | POA: Diagnosis present

## 2015-09-19 DIAGNOSIS — I824Z1 Acute embolism and thrombosis of unspecified deep veins of right distal lower extremity: Secondary | ICD-10-CM | POA: Diagnosis not present

## 2015-09-19 DIAGNOSIS — I82401 Acute embolism and thrombosis of unspecified deep veins of right lower extremity: Secondary | ICD-10-CM | POA: Diagnosis not present

## 2015-09-19 DIAGNOSIS — D61810 Antineoplastic chemotherapy induced pancytopenia: Secondary | ICD-10-CM | POA: Diagnosis present

## 2015-09-19 DIAGNOSIS — D709 Neutropenia, unspecified: Secondary | ICD-10-CM | POA: Diagnosis present

## 2015-09-19 DIAGNOSIS — M7989 Other specified soft tissue disorders: Secondary | ICD-10-CM

## 2015-09-19 DIAGNOSIS — L539 Erythematous condition, unspecified: Secondary | ICD-10-CM | POA: Diagnosis not present

## 2015-09-19 DIAGNOSIS — R609 Edema, unspecified: Secondary | ICD-10-CM | POA: Diagnosis not present

## 2015-09-19 DIAGNOSIS — C8330 Diffuse large B-cell lymphoma, unspecified site: Secondary | ICD-10-CM | POA: Diagnosis not present

## 2015-09-19 DIAGNOSIS — K219 Gastro-esophageal reflux disease without esophagitis: Secondary | ICD-10-CM | POA: Diagnosis present

## 2015-09-19 DIAGNOSIS — Z87891 Personal history of nicotine dependence: Secondary | ICD-10-CM | POA: Diagnosis not present

## 2015-09-19 DIAGNOSIS — D61818 Other pancytopenia: Secondary | ICD-10-CM | POA: Diagnosis present

## 2015-09-19 DIAGNOSIS — A419 Sepsis, unspecified organism: Secondary | ICD-10-CM | POA: Diagnosis present

## 2015-09-19 DIAGNOSIS — T380X5A Adverse effect of glucocorticoids and synthetic analogues, initial encounter: Secondary | ICD-10-CM | POA: Diagnosis present

## 2015-09-19 DIAGNOSIS — C8338 Diffuse large B-cell lymphoma, lymph nodes of multiple sites: Secondary | ICD-10-CM | POA: Diagnosis not present

## 2015-09-19 DIAGNOSIS — R509 Fever, unspecified: Secondary | ICD-10-CM | POA: Diagnosis not present

## 2015-09-19 LAB — CBC WITH DIFFERENTIAL/PLATELET
BASOS ABS: 0 10*3/uL (ref 0.0–0.1)
BASOS PCT: 0 %
Eosinophils Absolute: 0 10*3/uL (ref 0.0–0.7)
Eosinophils Relative: 0 %
HEMATOCRIT: 26.2 % — AB (ref 39.0–52.0)
Hemoglobin: 8.7 g/dL — ABNORMAL LOW (ref 13.0–17.0)
Lymphocytes Relative: 10 %
Lymphs Abs: 0.5 10*3/uL — ABNORMAL LOW (ref 0.7–4.0)
MCH: 28.8 pg (ref 26.0–34.0)
MCHC: 33.2 g/dL (ref 30.0–36.0)
MCV: 86.8 fL (ref 78.0–100.0)
MONO ABS: 0.2 10*3/uL (ref 0.1–1.0)
Monocytes Relative: 5 %
NEUTROS ABS: 3.9 10*3/uL (ref 1.7–7.7)
NEUTROS PCT: 85 %
Platelets: 143 10*3/uL — ABNORMAL LOW (ref 150–400)
RBC: 3.02 MIL/uL — ABNORMAL LOW (ref 4.22–5.81)
RDW: 16.5 % — AB (ref 11.5–15.5)
WBC: 4.6 10*3/uL (ref 4.0–10.5)

## 2015-09-19 LAB — COMPREHENSIVE METABOLIC PANEL
ALK PHOS: 76 U/L (ref 38–126)
ALT: 24 U/L (ref 17–63)
ANION GAP: 9 (ref 5–15)
AST: 26 U/L (ref 15–41)
Albumin: 2.8 g/dL — ABNORMAL LOW (ref 3.5–5.0)
BILIRUBIN TOTAL: 0.8 mg/dL (ref 0.3–1.2)
BUN: 12 mg/dL (ref 6–20)
CALCIUM: 8.4 mg/dL — AB (ref 8.9–10.3)
CO2: 23 mmol/L (ref 22–32)
Chloride: 104 mmol/L (ref 101–111)
Creatinine, Ser: 0.7 mg/dL (ref 0.61–1.24)
Glucose, Bld: 98 mg/dL (ref 65–99)
Potassium: 4.1 mmol/L (ref 3.5–5.1)
Sodium: 136 mmol/L (ref 135–145)
TOTAL PROTEIN: 5.7 g/dL — AB (ref 6.5–8.1)

## 2015-09-19 LAB — URINALYSIS, ROUTINE W REFLEX MICROSCOPIC
BILIRUBIN URINE: NEGATIVE
Glucose, UA: NEGATIVE mg/dL
Hgb urine dipstick: NEGATIVE
KETONES UR: NEGATIVE mg/dL
Leukocytes, UA: NEGATIVE
NITRITE: NEGATIVE
Protein, ur: 30 mg/dL — AB
SPECIFIC GRAVITY, URINE: 1.016 (ref 1.005–1.030)
UROBILINOGEN UA: 1 mg/dL (ref 0.0–1.0)
pH: 7.5 (ref 5.0–8.0)

## 2015-09-19 LAB — I-STAT CG4 LACTIC ACID, ED: LACTIC ACID, VENOUS: 0.69 mmol/L (ref 0.5–2.0)

## 2015-09-19 LAB — URINE MICROSCOPIC-ADD ON

## 2015-09-19 MED ORDER — OXANDROLONE 2.5 MG PO TABS
5.0000 mg | ORAL_TABLET | Freq: Two times a day (BID) | ORAL | Status: DC
  Administered 2015-09-20 – 2015-09-22 (×5): 5 mg via ORAL
  Filled 2015-09-19 (×6): qty 2

## 2015-09-19 MED ORDER — VANCOMYCIN HCL IN DEXTROSE 1-5 GM/200ML-% IV SOLN
1000.0000 mg | Freq: Once | INTRAVENOUS | Status: AC
Start: 2015-09-19 — End: 2015-09-19
  Administered 2015-09-19: 1000 mg via INTRAVENOUS
  Filled 2015-09-19: qty 200

## 2015-09-19 MED ORDER — PROCHLORPERAZINE MALEATE 10 MG PO TABS
10.0000 mg | ORAL_TABLET | Freq: Four times a day (QID) | ORAL | Status: DC | PRN
  Filled 2015-09-19: qty 1

## 2015-09-19 MED ORDER — ENOXAPARIN SODIUM 40 MG/0.4ML ~~LOC~~ SOLN
40.0000 mg | Freq: Every day | SUBCUTANEOUS | Status: DC
  Administered 2015-09-20: 40 mg via SUBCUTANEOUS
  Filled 2015-09-19 (×2): qty 0.4

## 2015-09-19 MED ORDER — ALLOPURINOL 300 MG PO TABS
300.0000 mg | ORAL_TABLET | Freq: Every day | ORAL | Status: DC
  Administered 2015-09-20 – 2015-09-22 (×3): 300 mg via ORAL
  Filled 2015-09-19 (×3): qty 1

## 2015-09-19 MED ORDER — POLYETHYLENE GLYCOL 3350 17 G PO PACK: 17.0000 g | PACK | Freq: Every day | ORAL | Status: DC

## 2015-09-19 MED ORDER — DEXTROSE-NACL 5-0.45 % IV SOLN
INTRAVENOUS | Status: DC
  Administered 2015-09-20: 01:00:00 via INTRAVENOUS

## 2015-09-19 MED ORDER — PANTOPRAZOLE SODIUM 40 MG PO TBEC
40.0000 mg | DELAYED_RELEASE_TABLET | Freq: Every day | ORAL | Status: DC
  Administered 2015-09-20 – 2015-09-22 (×3): 40 mg via ORAL
  Filled 2015-09-19 (×3): qty 1

## 2015-09-19 MED ORDER — PRO-STAT SUGAR FREE PO LIQD
30.0000 mL | Freq: Two times a day (BID) | ORAL | Status: DC
  Administered 2015-09-20 – 2015-09-22 (×5): 30 mL via ORAL
  Filled 2015-09-19 (×7): qty 30

## 2015-09-19 MED ORDER — SODIUM CHLORIDE 0.9 % IV BOLUS (SEPSIS)
500.0000 mL | Freq: Once | INTRAVENOUS | Status: AC
  Administered 2015-09-19: 500 mL via INTRAVENOUS

## 2015-09-19 MED ORDER — ONDANSETRON HCL 4 MG PO TABS: 8.0000 mg | ORAL_TABLET | Freq: Two times a day (BID) | ORAL | Status: DC | PRN

## 2015-09-19 MED ORDER — VANCOMYCIN HCL IN DEXTROSE 1-5 GM/200ML-% IV SOLN: 1000.0000 mg | Freq: Once | INTRAVENOUS | Status: DC

## 2015-09-19 MED ORDER — JUVEN PO PACK
1.0000 | PACK | Freq: Two times a day (BID) | ORAL | Status: DC
  Administered 2015-09-20 – 2015-09-22 (×6): 1 via ORAL
  Filled 2015-09-19 (×6): qty 1

## 2015-09-19 MED ORDER — MIRTAZAPINE 15 MG PO TABS
15.0000 mg | ORAL_TABLET | Freq: Every day | ORAL | Status: DC
  Administered 2015-09-20 – 2015-09-21 (×2): 15 mg via ORAL
  Filled 2015-09-19 (×4): qty 1

## 2015-09-19 MED ORDER — ACETAMINOPHEN 325 MG PO TABS: 650.0000 mg | ORAL_TABLET | Freq: Four times a day (QID) | ORAL | Status: DC | PRN

## 2015-09-19 MED ORDER — SENNOSIDES-DOCUSATE SODIUM 8.6-50 MG PO TABS
2.0000 | ORAL_TABLET | Freq: Two times a day (BID) | ORAL | Status: DC
  Administered 2015-09-20 – 2015-09-22 (×5): 2 via ORAL
  Filled 2015-09-19 (×7): qty 2

## 2015-09-19 MED ORDER — MIRTAZAPINE 15 MG PO TABS
15.0000 mg | ORAL_TABLET | Freq: Every evening | ORAL | Status: DC | PRN
  Administered 2015-09-20 (×2): 15 mg via ORAL
  Filled 2015-09-19 (×3): qty 1

## 2015-09-19 MED ORDER — PIPERACILLIN-TAZOBACTAM 3.375 G IVPB
3.3750 g | Freq: Once | INTRAVENOUS | Status: AC
  Administered 2015-09-19: 3.375 g via INTRAVENOUS
  Filled 2015-09-19: qty 50

## 2015-09-19 MED ORDER — ALBUMIN HUMAN 25 % IV SOLN
12.5000 g | Freq: Once | INTRAVENOUS | Status: AC
  Administered 2015-09-20: 12.5 g via INTRAVENOUS
  Filled 2015-09-19: qty 50

## 2015-09-19 MED ORDER — PIPERACILLIN-TAZOBACTAM 3.375 G IVPB 30 MIN: 3.3750 g | Freq: Once | INTRAVENOUS | Status: DC

## 2015-09-19 NOTE — ED Provider Notes (Signed)
CSN: [id number]     Arrival date & time 09/19/15  1851 History   First MD Initiated Contact with Patient 09/19/15 1901     Chief Complaint  Patient presents with  . Fever     (Consider location/radiation/quality/duration/timing/severity/associated sxs/prior Treatment) Patient is a 69 y.o. male presenting with fever. The history is provided by the patient (Patient complains of a fever today. And weakness. Patient has history of lymphoma was seen by oncology yesterday and given IV fluids and albumin.).  Fever Temp source:  Subjective Severity:  Moderate Onset quality:  Sudden Timing:  Constant Progression:  Unchanged Chronicity:  New Relieved by:  None tried Associated symptoms: no chest pain, no congestion, no cough, no diarrhea, no headaches and no rash     Past Medical History  Diagnosis Date  . History of bleeding ulcers 1990's  . 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 Anxiety   . GERD (gastroesophageal reflux disease)   . H. pylori infection   . History of blood transfusion   . Bone cancer      tumor on C5  . Arm numbness 08/07/15    Limited movement due to tumor on spine  . Diffuse large B cell lymphoma     biposy 07/25/2105   Past Surgical History  Procedure Laterality Date  . Hernia repair Bilateral     with mesh  . Colonoscopy    . Eye surgery Right     catheter  . Anterior cervical corpectomy N/A 07/26/2015    Procedure: Cervical five Corpectomy/Cervical four-six Fusion/Plate ;  Surgeon: Eustace Moore, MD;  Location: [place] [facility];  Service: Neurosurgery;  Laterality: N/A;  C5 Corpectomy/C4-6 Fusion/Plate C4-6   Family History  Problem Relation Age of Onset  . Hypertension Mother    Social History  Substance Use Topics  . Smoking status: Former Smoker -- 1.00 packs/day for 10 years    Types: Cigarettes    Quit date: 12/23/1981  . Smokeless tobacco: Never Used  . Alcohol Use: 3.0 oz/week    2 Glasses of wine, 3 Cans of beer per week    Review of Systems   Constitutional: Positive for fever. Negative for appetite change and fatigue.  HENT: Negative for congestion, ear discharge and sinus pressure.   Eyes: Negative for discharge.  Respiratory: Negative for cough.   Cardiovascular: Negative for chest pain.  Gastrointestinal: Negative for abdominal pain and diarrhea.  Genitourinary: Negative for frequency and hematuria.  Musculoskeletal: Negative for back pain.  Skin: Negative for rash.  Neurological: Negative for seizures and headaches.  Psychiatric/Behavioral: Negative for hallucinations.      Allergies  Neulasta  Home Medications   Prior to Admission medications   Medication Sig Start Date End Date Taking? Authorizing Provider  acetaminophen (TYLENOL) 325 MG tablet Take 2 tablets (650 mg total) by mouth every 6 (six) hours as needed for mild pain, moderate pain, fever or headache (or Fever >/= 101). 09/05/15  Yes Modena Jansky, MD  allopurinol (ZYLOPRIM) 300 MG tablet Take 300 mg by mouth daily.   Yes Historical Provider, MD  Amino Acids-Protein Hydrolys (FEEDING SUPPLEMENT, PRO-STAT SUGAR FREE 64,) LIQD Take 30 mLs by mouth 2 (two) times daily with a meal.   Yes Historical Provider, MD  mirtazapine (REMERON) 15 MG tablet Take 1 tablet (15 mg total) by mouth at bedtime. Might increase to 30mg  po HS in 7 days if still having insomnia Patient taking differently: Take 15-30 mg by mouth See admin instructions. Takes 15mg   qhs and may repeat in 1 hour if not effective 09/11/15  Yes Gautam Juleen China, MD  Multiple Vitamin (MULTIVITAMIN WITH MINERALS) TABS tablet Take 1 tablet by mouth daily.   Yes Historical Provider, MD  nutrition supplement, JUVEN, (JUVEN) PACK Take 1 packet by mouth 2 (two) times daily between meals. Mix in 8 oz of water or juice   Yes Historical Provider, MD  omeprazole (PRILOSEC) 40 MG capsule Take 1 capsule (40 mg total) by mouth daily. 07/24/15  Yes Brunetta Genera, MD  oxandrolone (OXANDRIN) 2.5 MG tablet Take 2  tablets (5 mg total) by mouth 2 (two) times daily. 09/11/15  Yes Brunetta Genera, MD  polyethylene glycol (MIRALAX / GLYCOLAX) packet Take 17 g by mouth daily.   Yes Historical Provider, MD  senna-docusate (SENNA S) 8.6-50 MG per tablet Take 2 tablets by mouth at bedtime. Patient taking differently: Take 2 tablets by mouth 2 (two) times daily.  09/11/15  Yes Brunetta Genera, MD  lidocaine-prilocaine (EMLA) cream Apply to affected area once Patient not taking: Reported on 09/18/2015 08/08/15   Brunetta Genera, MD  neomycin-bacitracin-polymyxin (NEOSPORIN) ointment Apply 1 application topically as needed for wound care (left foot blister).     Historical Provider, MD  ondansetron (ZOFRAN) 8 MG tablet Take 1 tablet (8 mg total) by mouth 2 (two) times daily. Start the day after chemo for 3 days. Then as needed for nausea or vomiting. 08/08/15   Brunetta Genera, MD  [place]    Historical Provider, MD  prochlorperazine (COMPAZINE) 10 MG tablet Take 1 tablet (10 mg total) by mouth every 6 (six) hours as needed (Nausea or vomiting). 08/08/15   Brunetta Genera, MD  temazepam (RESTORIL) 15 MG capsule Take 1 capsule (15 mg total) by mouth at bedtime as needed for sleep. 09/11/15   Brunetta Genera, MD   BP 111/59 mmHg  Pulse 98  Temp(Src) 100 F (37.8 C) (Oral)  Resp 26  SpO2 96% Physical Exam  Constitutional: He is oriented to person, place, and time. He appears well-developed.  HENT:  Head: Normocephalic.  Eyes: Conjunctivae and EOM are normal. No scleral icterus.  Neck: Neck supple. No thyromegaly present.  Cardiovascular: Normal rate and regular rhythm.  Exam reveals no gallop and no friction rub.   No murmur heard. Pulmonary/Chest: No stridor. He has no wheezes. He has no rales. He exhibits no tenderness.  Abdominal: He exhibits no distension. There is no tenderness. There is no rebound.  Musculoskeletal: Normal range of motion. He exhibits edema.   Patient's right lower extremity swollen tender red and feels hot. His left lower extremity is swollen only.  Lymphadenopathy:    He has no cervical adenopathy.  Neurological: He is oriented to person, place, and time. He exhibits normal muscle tone. Coordination normal.  Skin: No rash noted. No erythema.  Psychiatric: He has a normal mood and affect. His behavior is normal.    ED Course  Procedures (including critical care time) Labs Review Labs Reviewed  COMPREHENSIVE METABOLIC PANEL - Abnormal; Notable for the following:    Calcium 8.4 (*)    Total Protein 5.7 (*)    Albumin 2.8 (*)    All other components within normal limits  CBC WITH DIFFERENTIAL/PLATELET - Abnormal; Notable for the following:    RBC 3.02 (*)    Hemoglobin 8.7 (*)    HCT 26.2 (*)    RDW 16.5 (*)    Platelets 143 (*)  Lymphs Abs 0.5 (*)    All other components within normal limits  URINALYSIS, ROUTINE W REFLEX MICROSCOPIC (NOT AT [facility]) - Abnormal; Notable for the following:    Color, Urine AMBER (*)    Protein, ur 30 (*)    All other components within normal limits  CULTURE, BLOOD (ROUTINE X 2)  CULTURE, BLOOD (ROUTINE X 2)  URINE CULTURE  URINE MICROSCOPIC-ADD ON  I-STAT CG4 LACTIC ACID, ED  I-STAT CG4 LACTIC ACID, ED    Imaging Review Dg Chest 2 View  09/19/2015   CLINICAL DATA:  Pt c/o weakness, fever, sob, bilateral lower extremity swelling x several days, pt undergoing chemotherapy.  EXAM: CHEST  2 VIEW  COMPARISON:  09/01/2015  FINDINGS: Heart size is accentuated by the AP position of patient. There is persistent patchy density at the lung bases bilaterally, consistent with atelectasis or infiltrates. No pleural effusions. Aeration has improved over recent studies. Previous cervical fusion.  IMPRESSION: Persistent bilateral lower lobe atelectasis or infiltrates.   Electronically Signed   By: Nolon Nations M.D.   On: 09/19/2015 19:57   I have personally reviewed and evaluated these images and  lab results as part of my medical decision-making.   EKG Interpretation None      MDM   Final diagnoses:  Cellulitis of right lower extremity    Labs unremarkable chest x-ray shows persistent lower lobe infiltrates. Patient will be admitted for IV antibiotics for possible cellulitis or persistent pneumonia.    Milton Ferguson, MD 09/19/15 2200

## 2015-09-19 NOTE — Progress Notes (Signed)
SYMPTOM MANAGEMENT CLINIC   HPI: Ethan Stewart 69 y.o. male diagnosed with diffuse B-cell lymphoma.  Currently undergoing R-CHOP chemotherapy regimen.  Patient reports minimal appetite and poor oral intake since his last cycle of chemotherapy.  He feels dehydrated today.  He has also noted an increase in his heart rate within the past few days as well.  He denies any chest pain, chest pressure, shortness of breath, or pain with inspiration.  He denies any recent fevers or chills.  HPI  ROS  Past Medical History  Diagnosis Date  . History of bleeding ulcers 1990's  . 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 Anxiety   . GERD (gastroesophageal reflux disease)   . H. pylori infection   . History of blood transfusion   . Bone cancer      tumor on C5  . Arm numbness 08/07/15    Limited movement due to tumor on spine  . Diffuse large B cell lymphoma     biposy 07/25/2105    Past Surgical History  Procedure Laterality Date  . Hernia repair Bilateral     with mesh  . Colonoscopy    . Eye surgery Right     catheter  . Anterior cervical corpectomy N/A 07/26/2015    Procedure: Cervical five Corpectomy/Cervical four-six Fusion/Plate ;  Surgeon: Eustace Moore, MD;  Location: [place] [facility];  Service: Neurosurgery;  Laterality: N/A;  C5 Corpectomy/C4-6 Fusion/Plate C4-6    has Bone lesion; Cervical spine tumor; Neck pain, acute; DLBCL (diffuse large B cell lymphoma); S/P cervical spinal fusion; HCAP (healthcare-associated pneumonia); Sepsis; Anemia associated with chemotherapy; Sinus tachycardia; GERD (gastroesophageal reflux disease); Blood poisoning; Hypoxia; Acute on chronic respiratory failure with hypoxia; ARDS (adult respiratory distress syndrome); Acute respiratory failure; Pressure ulcer; MDD (major depressive disorder), recurrent severe, without psychosis; Dysphagia, pharyngoesophageal phase; Protein-calorie malnutrition, severe; Acute respiratory failure with hypoxemia; Hypokalemia;  Dysphagia; Elevated LFTs; Abnormal liver function tests; Hypoalbuminemia; and Dehydration on his problem list.    is allergic to neulasta.    Medication List       This list is accurate as of: 09/18/15 11:59 PM.  Always use your most recent med list.               acetaminophen 325 MG tablet  Commonly known as:  TYLENOL  Take 2 tablets (650 mg total) by mouth every 6 (six) hours as needed for mild pain, moderate pain, fever or headache (or Fever >/= 101).     lidocaine-prilocaine cream  Commonly known as:  EMLA  Apply to affected area once     mirtazapine 15 MG tablet  Commonly known as:  REMERON  Take 1 tablet (15 mg total) by mouth at bedtime. Might increase to 60m po HS in 7 days if still having insomnia     multivitamin with minerals Tabs tablet  Take 1 tablet by mouth daily.     neomycin-bacitracin-polymyxin ointment  Commonly known as:  NEOSPORIN  Apply 1 application topically as needed for wound care (left foot blister). apply to eye     omeprazole 40 MG capsule  Commonly known as:  PRILOSEC  Take 1 capsule (40 mg total) by mouth daily.     ondansetron 8 MG tablet  Commonly known as:  ZOFRAN  Take 1 tablet (8 mg total) by mouth 2 (two) times daily. Start the day after chemo for 3 days. Then as needed for nausea or vomiting.     oxandrolone 2.5 MG tablet  Commonly known as:  OXANDRIN  Take 2 tablets (5 mg total) by mouth 2 (two) times daily.     PRESCRIPTION MEDICATION  [facility] CHEMO     prochlorperazine 10 MG tablet  Commonly known as:  COMPAZINE  Take 1 tablet (10 mg total) by mouth every 6 (six) hours as needed (Nausea or vomiting).     senna-docusate 8.6-50 MG tablet  Commonly known as:  SENNA S  Take 2 tablets by mouth at bedtime.     temazepam 15 MG capsule  Commonly known as:  RESTORIL  Take 1 capsule (15 mg total) by mouth at bedtime as needed for sleep.         PHYSICAL EXAMINATION  Oncology Vitals 09/18/2015 09/11/2015 09/11/2015 09/11/2015  09/11/2015 09/11/2015 09/11/2015  Height - - - - - - 185 cm  Weight 87.907 kg - - - - - 85.412 kg  Weight (lbs) 193 lbs 13 oz - - - - - 188 lbs 5 oz  BMI (kg/m2) - - - - - - 24.84 kg/m2  Temp 98.6 98.3 98 98.5 98.3 98.1 98.1  Pulse 128 78 86 83 79 77 113  Resp _0 SpO2 98 98 97 98 97 98 99  BSA (m2) - - - - - - 2.1 m2   BP Readings from Last 3 Encounters:  09/18/15 121/74  09/11/15 128/96  09/11/15 116/82    Physical Exam  Constitutional: He is oriented to person, place, and time.  Patient appears fatigued, slightly week, and chronically ill.  HENT:  Head: Normocephalic and atraumatic.  Mouth/Throat: Oropharynx is clear and moist.  Eyes: Conjunctivae and EOM are normal. Pupils are equal, round, and reactive to light. Right eye exhibits no discharge. Left eye exhibits no discharge. No scleral icterus.  Neck: Normal range of motion. Neck supple. No JVD present. No tracheal deviation present. No thyromegaly present.  Cardiovascular: Regular rhythm, normal heart sounds and intact distal pulses.   Tachycardia  Pulmonary/Chest: Effort normal and breath sounds normal. No respiratory distress. He has no wheezes. He has no rales. He exhibits no tenderness.  Abdominal: Soft. Bowel sounds are normal. He exhibits no distension and no mass. There is no tenderness. There is no rebound and no guarding.  Musculoskeletal: Normal range of motion. He exhibits edema. He exhibits no tenderness.  Trace edema to bilateral ankles.  Lymphadenopathy:    He has no cervical adenopathy.  Neurological: He is alert and oriented to person, place, and time.  Skin: Skin is warm and dry. No rash noted. No erythema. There is pallor.  Psychiatric: Affect normal.  Nursing note and vitals reviewed.   LABORATORY DATA:. Appointment on 09/18/2015  Component Date Value Ref Range Status  . WBC 09/18/2015 7.2  4.0 - 10.3 10e3/uL Final  . NEUT# 09/18/2015 6.5  1.5 - 6.5 10e3/uL Final  . HGB 09/18/2015  10.1* 13.0 - 17.1 g/dL Final  . HCT 09/18/2015 30.9* 38.4 - 49.9 % Final  . Platelets 09/18/2015 170  140 - 400 10e3/uL Final  . MCV 09/18/2015 87.0  79.3 - 98.0 fL Final  . MCH 09/18/2015 28.5  27.2 - 33.4 pg Final  . MCHC 09/18/2015 32.7  32.0 - 36.0 g/dL Final  . RBC 09/18/2015 3.55* 4.20 - 5.82 10e6/uL Final  . RDW 09/18/2015 16.3* 11.0 - 14.6 % Final  . lymph# 09/18/2015 0.4* 0.9 - 3.3 10e3/uL Final  . MONO# 09/18/2015 0.1  0.1 - 0.9 10e3/uL Final  .  Eosinophils Absolute 09/18/2015 0.1  0.0 - 0.5 10e3/uL Final  . Basophils Absolute 09/18/2015 0.0  0.0 - 0.1 10e3/uL Final  . NEUT% 09/18/2015 90.6* 39.0 - 75.0 % Final  . LYMPH% 09/18/2015 5.8* 14.0 - 49.0 % Final  . MONO% 09/18/2015 1.7  0.0 - 14.0 % Final  . EOS% 09/18/2015 1.8  0.0 - 7.0 % Final  . BASO% 09/18/2015 0.1  0.0 - 2.0 % Final  . Retic % 09/18/2015 1.03  0.80 - 1.80 % Final  . Retic Ct Abs 09/18/2015 36.57  34.80 - 93.90 10e3/uL Final  . Immature Retic Fract 09/18/2015 27.10* 3.00 - 10.60 % Final  . Sodium 09/18/2015 140  136 - 145 mEq/L Final  . Potassium 09/18/2015 4.2  3.5 - 5.1 mEq/L Final  . Chloride 09/18/2015 103  98 - 109 mEq/L Final  . CO2 09/18/2015 27  22 - 29 mEq/L Final  . Glucose 09/18/2015 107  70 - 140 mg/dl Final   Glucose reference range is for nonfasting patients. Fasting glucose reference range is 70- 100.  [name] Kitchen BUN 09/18/2015 9.4  7.0 - 26.0 mg/dL Final  . Creatinine 09/18/2015 0.7  0.7 - 1.3 mg/dL Final  . Total Bilirubin 09/18/2015 0.61  0.20 - 1.20 mg/dL Final  . Alkaline Phosphatase 09/18/2015 107  40 - 150 U/L Final  . AST 09/18/2015 15  5 - 34 U/L Final  . ALT 09/18/2015 24  0 - 55 U/L Final  . Total Protein 09/18/2015 6.0* 6.4 - 8.3 g/dL Final  . Albumin 09/18/2015 2.7* 3.5 - 5.0 g/dL Final  . Calcium 09/18/2015 9.1  8.4 - 10.4 mg/dL Final  . Anion Gap 09/18/2015 9  3 - 11 mEq/L Final  . EGFR 09/18/2015 >90  >90 ml/min/1.73 m2 Final   eGFR is calculated using the CKD-EPI Creatinine  Equation (2009)   EKG: Sinus tach with rate of 106.  QTc 441.   (Scanned copy of EKG not available; but will have EKG copied in to chart).   RADIOGRAPHIC STUDIES: No results found.  ASSESSMENT/PLAN:    Hypoalbuminemia Patient noted to have some mild peripheral edema to his bilateral ankles.  Albumen remains low; this actually improved from 2.2 up to 2.7.  Patient will receive albumen 12.5 g of the 25% solution in 50 ML's today while at the cancer center.  Patient was encouraged to push protein in his diet as well.  DLBCL (diffuse large B cell lymphoma) Patient received cycle 2 of his R-CHOP chemotherapy regimen on 09/11/2015.  Patient is complaining of some increased fatigue, elevated heart rate, minimal appetite, and feeling dehydrated today.  He denies any recent fevers or chills.  Exam today reveals patient to appear fatigued, slightly weak, but nontoxic.  Lungs clear bilaterally.  Blood counts obtained today revealed a WBC of 7.2, ANC 6.5, hemoglobin 10.1, and platelet count 170.  Patient will receive IV fluid rehydration today; as well as albumen.  Patient has plans to return for labs only on 09/25/2015.  He is scheduled for restaging PET scan on 09/29/2015.  Patient is scheduled for labs, visit, and his next cycle of chemotherapy on 10/02/2015.  Dehydration Patient reports minimal appetite and poor oral intake since his last cycle of chemotherapy.  He feels dehydrated today.  He has also noted an increase in his heart rate within the past few days as well.  He denies any chest pain, chest pressure, shortness of breath, or pain with inspiration.  He denies any recent fevers or  chills.  On exam.-Patient does appear fatigued, slightly weak, and dehydrated.    EKG obtained today revealed a sinus tachycardia with a rate of 106; with a QTC of 441.  Patient will receive IV fluid rehydration; as well as albumen while at the cancer Center today.  He was also encouraged to push fluids  at home is much as possible.  Patient was advised to call/return or go directly to the emergency department for any worsening symptoms whatsoever.  Patient stated understanding of all instructions; and was in agreement with this plan of care. The patient knows to call the clinic with any problems, questions or concerns.   This was a shared visit with  Dr. Irene Limbo.   Total time spent with patient was 40 minutes;  with greater than 75 percent of that time spent in face to face counseling regarding patient's symptoms,  and coordination of care and follow up.  Disclaimer:This dictation was prepared with Dragon/digital dictation along with [employer]. Any transcriptional errors that result from this process are unintentional.  Drue Second, NP 09/19/2015    Addendum  Patient was personally seen, interviewed, examined and lab results reviewed. Patient's findings and treatment plan were discussed in details with Retta Mac. About documentation reflects our joint assessment, findings and treatment plan. Mild sinus tachycardia in the setting of deconditioning and hypovolemia due to poor oral intake. Treated with IV fluids and IV albumin. Patient was discharged back to his skilled nursing facility in stable condition.  Sullivan Lone MD MS Hematology/Oncology Physician [facility]

## 2015-09-19 NOTE — H&P (Signed)
History and Physical  Ethan Stewart  ZOX:[id number]  DOB: [birth year]  DOA: 09/19/2015  Referring physician: Milton Ferguson, MD PCP:  Ethan Crutch, MD   Chief Complaint: "I had a fever today."  HPI: Ethan Stewart is a 69 y.o. male with a past medical history significant for DLBCL dx'd June 2016 with extranodal mets to C-spine s/p resection and residual R arm paresis completed 2 cycles R-CHOP and recent hospitalization for Neulasta-related ARDS versus HCAP who presents with one day fever.  The patient has been [place] uneventfully after his hospitalization for respiratory failure 2 weeks ago, until today when he developed fever and came right to the hospital. The fever is not associated with cough, shortness of breath, sputum production.  He does note bilateral leg swelling and a pressure sore on his sacrum, but denies dysuria or lower urinary tract symptoms.  In the ED, the patient was febrile and tachycardic and tachypneic but not hypoxic, with a normal absolute neutrophil count, and with a normal urinalysis. His chest x-ray showed resolving pneumonia. His right lower extremity was red and swollen and painful. Blood cultures were drawn and urine culture was drawn and he was started on broad-spectrum antibiotics and admitted to [facility].   Review of Systems:  Patient seen 10:43 PM on 09/19/2015. Pt complains of generalized weakness persistent, fever, one recent episode of coughing after drinking. Pt denies any worsening cough, shortness of breath, vomiting, diarrhea, headache, neck pain, chest pain, abdominal pain.  Otherwise, twelve systems were reviewed and were negative except as noted above in the history of present illness.  Past Medical History  Diagnosis Date  . History of bleeding ulcers 1990's  . 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 Anxiety   . GERD (gastroesophageal reflux disease)   . H. pylori infection   . History of blood transfusion   . Bone cancer      tumor on C5  . Arm  numbness 08/07/15    Limited movement due to tumor on spine  . Diffuse large B cell lymphoma     biposy 07/25/2105  The above past medical history was reviewed.  Past Surgical History  Procedure Laterality Date  . Hernia repair Bilateral     with mesh  . Colonoscopy    . Eye surgery Right     catheter  . Anterior cervical corpectomy N/A 07/26/2015    Procedure: Cervical five Corpectomy/Cervical four-six Fusion/Plate ;  Surgeon: Eustace Moore, MD;  Location: [facility];  Service: Neurosurgery;  Laterality: N/A;  C5 Corpectomy/C4-6 Fusion/Plate C4-6  The above surgical history was reviewed.  Social History: Patient lives in a nursing home currently at [facility]. He is married. He does not smoke. 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   Allergies  Allergen Reactions  . Neulasta [Pegfilgrastim] Other (See Comments)    ARDS likely related to Neulasta    Family History  Problem Relation Age of Onset  . Hypertension Mother   History of cancer "all on my mother's side and heart disease all on my father's side."  Prior to Admission medications   Medication Sig Start Date End Date Taking? Authorizing Provider  acetaminophen (TYLENOL) 325 MG tablet Take 2 tablets (650 mg total) by mouth every 6 (six) hours as needed for mild pain, moderate pain, fever or headache (or Fever >/= 101). 09/05/15  Yes Modena Jansky, MD  allopurinol (ZYLOPRIM) 300 MG tablet Take  300 mg by mouth daily.   Yes Historical Provider, MD  Amino Acids-Protein Hydrolys (FEEDING SUPPLEMENT, PRO-STAT SUGAR FREE 64,) LIQD Take 30 mLs by mouth 2 (two) times daily with a meal.   Yes Historical Provider, MD  mirtazapine (REMERON) 15 MG tablet Take 1 tablet (15 mg total) by mouth at bedtime. Might increase to 30mg  po HS in 7 days if still having insomnia Patient taking differently: Take 15-30 mg by mouth See admin instructions. Takes 15mg  qhs and may  repeat in 1 hour if not effective 09/11/15  Yes Gautam Juleen China, MD  Multiple Vitamin (MULTIVITAMIN WITH MINERALS) TABS tablet Take 1 tablet by mouth daily.   Yes Historical Provider, MD  nutrition supplement, JUVEN, (JUVEN) PACK Take 1 packet by mouth 2 (two) times daily between meals. Mix in 8 oz of water or juice   Yes Historical Provider, MD  omeprazole (PRILOSEC) 40 MG capsule Take 1 capsule (40 mg total) by mouth daily. 07/24/15  Yes Brunetta Genera, MD  oxandrolone (OXANDRIN) 2.5 MG tablet Take 2 tablets (5 mg total) by mouth 2 (two) times daily. 09/11/15  Yes Brunetta Genera, MD  polyethylene glycol (MIRALAX / GLYCOLAX) packet Take 17 g by mouth daily.   Yes Historical Provider, MD  senna-docusate (SENNA S) 8.6-50 MG per tablet Take 2 tablets by mouth at bedtime. Patient taking differently: Take 2 tablets by mouth 2 (two) times daily.  09/11/15  Yes Brunetta Genera, MD  neomycin-bacitracin-polymyxin (NEOSPORIN) ointment Apply 1 application topically as needed for wound care (left foot blister).     Historical Provider, MD  ondansetron (ZOFRAN) 8 MG tablet Take 1 tablet (8 mg total) by mouth 2 (two) times daily. Start the day after chemo for 3 days. Then as needed for nausea or vomiting. 08/08/15   Brunetta Genera, MD  [place]    Historical Provider, MD  prochlorperazine (COMPAZINE) 10 MG tablet Take 1 tablet (10 mg total) by mouth every 6 (six) hours as needed (Nausea or vomiting). 08/08/15   Brunetta Genera, MD    Physical Exam: BP 112/58 mmHg  Pulse 100  Temp(Src) 98.7 F (37.1 C) (Oral)  Resp 26  SpO2 97% General: Adult male, tall, tired appearing, in no acute distress from pain or dyspnea.  Responds appropriately to questions.  Eye contact appropriate.the patient has reduced subcutaneous fat. HEENT: Head normal. Temporal wasting. Corneas clear, conjunctivae and sclerae normal without injection or icterus, possibly mild pallor, lids and  lashes normal.  PERRL and EOMI.  Nose normal.  OP moist without erythema, exudates, cobblestoning, or ulcers.  No airway deformities.  Neck supple. No thyromegaly.  Lymph: No cervical or axillary lymphadenopathy. Skin: Warm and dry.  No jaundice.  The right foot to mid shin is red, swollen more than the left, and tender. There are no obvious ulcers. Cardiac: tachycardic regular, nl N5-A2, systolic ejection murmur appreciated.  Capillary refill is less than 2 seconds.  Radial and DP pulses strong and symmetric. Respiratory: Normal respiratory rate and rhythm.  Decreased inspiratory effort. CTAB without rales. Abdomen: BS present.  No TTP or rebound all quadrants.  No masses or organomegaly. No guarding.  No ascites, distension. Neuro: Sensorium intact but tired.  Cranial nerves 3-12 intact.  Speech is fluent.  Naming is grossly intact, and the patient's recall, recent and remote, as well as general fund of knowledge seem within normal limits.  Muscle bulk normal.  5/5 grip strength and elbow and wrist flexion/extension,  on the right.  5/5 hip flexion symmetrically.  Right arm paresis.   Psych: Blunted and sad affect. Thought content appropriate, and thought process linear.  No evidence of aural or visual hallucinations or delusions. Attention and concentration are normal.           Labs on Admission:  The metabolic panel is notable for normal sodium, potassium, bicarbonate, and renal function. Urinalysis is unremarkable. Albumin is low at 2.8 g/dL. There is no transaminitis or elevation in bilirubin. The complete blood count is notable for absence of leukocytosis with ANC 4.anemia progressed from previously now 8.7 g/dL, normal platelet count.     Radiological Exams on Admission: Personally reviewed: Dg Chest 2 View 09/19/2015 The lower left opacity present on CXR 09/01/15 is still present but improved.  I do not see definite new opacities from previous.    Assessment/Plan Principal  Problem:   Fever Active Problems:   DLBCL (diffuse large B cell lymphoma)   Sepsis   Pressure ulcer   MDD (major depressive disorder), recurrent severe, without psychosis   Protein-calorie malnutrition, severe   Hypoalbuminemia    1. Fever, suspect early sepsis:  Source not clear. Blood and urine cultures are pending. He does not have an indwelling line.  It is possible that he has aspirated, because they give a history of an episode of choking the other day. It is also possible that this red swollen leg has a cellulitis.  The lactic acid level is normal and he has no evidence of organ dysfunction. He is very concerned about ARDS (which she calls "pneumonia"). -vancomycin and Zosyn for sepsis with unknown source -Follow blood cultures -Follow urine cultures  2. Right foot swelling: This is new. Suspect cellulitis contributing to #1 above. We will rule out DVT. Right lower extremity ultrasound.  3. Hypoalbuminemia: Related to #4 below.  This complicates his fluid resuscitation. -additional bolus of albumin 25% 12.5 g once, followed by normal saline at 100 mL per hour  4. Protein calorie malnutrition: -Continue home nutritional supplements and Oxandrin and mirtazapine -PT/OT consult  5. Diffuse large B-cell lymphoma:  Finished second cycle of R CHOP therapy.t  The patient was discussed with Dr. Burr Medico by phone who agreed with the plan above -Consult to oncology, appreciate recommendations -continue allopurinol  6. Anemia, chemotherapy related:  The patient has reacted to pegfilgrastim.  7. Depression: Stable -continue mirtazapine    DVT PPx: Lovenox Diet: regular Consultants: oncology Code Status: DO NOT RESUSCITATE Family Communication: the patient's differential diagnosis diagnosis , plan workup, and current treatments were discussed with him and his wife at the bedside. His wishes for CODE STATUS were confirmed. All questions were answered.  Disposition Plan:  The  appropriate admission status for this patient is INPATIENT. Inpatient status is judged to be reasonable and necessary in order to provide the required intensity of service to ensure the patient's safety. The patient's presenting symptoms, physical exam findings, and initial radiographic and laboratory data in the context of their chronic comorbidities is felt to place them at high risk for further clinical deterioration. Furthermore, it is not anticipated that the patient will be medically stable for discharge from the hospital within 2 midnights of admission. The following factors support the admission status of inpatient.   A. The patient's presenting symptoms include fever. B. The worrisome physical exam findings include tachycardia, tachypnea, weakness. C. The initial radiographic and laboratory data are worrisome because of anemia, persistent chest x-ray opacity. D. The chronic co-morbidities include lymphoma,  protein calorie malnutrition, severe. E. Patient requires inpatient status due to high intensity of service, high risk for further deterioration and high frequency of surveillance required. F. I certify that at the point of admission it is my clinical judgment that the patient will require inpatient hospital care spanning beyond 2 midnights from the point of admission.     Edwin Dada [facility] Pager [phone]

## 2015-09-19 NOTE — Telephone Encounter (Signed)
Faxed pt medical records to [employer] and [place] [facility] [place] [phone]

## 2015-09-19 NOTE — Assessment & Plan Note (Signed)
Patient noted to have some mild peripheral edema to his bilateral ankles.  Albumen remains low; this actually improved from 2.2 up to 2.7.  Patient will receive albumen 12.5 g of the 25% solution in 50 ML's today while at the cancer center.  Patient was encouraged to push protein in his diet as well.

## 2015-09-19 NOTE — ED Notes (Addendum)
Per EMS, pt from [facility].  Pt has had fever, tachycardia today.  Fever up to 102 in facility.  Tylenol given 4:45pm.  Cancer patient - Last chemo 9/19.  Lymphoma.   Urine is dark x 1 week.  No bm reported for 1 week.  Yesterday - seen by oncology and given IVF and albumin.  Pt is alert and oriented.  Recent admission in ICU for pneumonia.  Edema in legs.  Small bed sore.  Vitals:  cbg 111, hr 116, bp 117/73, resp 20, 94% ra.  Re-eval on Monday with oncology.

## 2015-09-19 NOTE — Assessment & Plan Note (Signed)
Patient received cycle 2 of his R-CHOP chemotherapy regimen on 09/11/2015.  Patient is complaining of some increased fatigue, elevated heart rate, minimal appetite, and feeling dehydrated today.  He denies any recent fevers or chills.  Exam today reveals patient to appear fatigued, slightly weak, but nontoxic.  Lungs clear bilaterally.  Blood counts obtained today revealed a WBC of 7.2, ANC 6.5, hemoglobin 10.1, and platelet count 170.  Patient will receive IV fluid rehydration today; as well as albumen.  Patient has plans to return for labs only on 09/25/2015.  He is scheduled for restaging PET scan on 09/29/2015.  Patient is scheduled for labs, visit, and his next cycle of chemotherapy on 10/02/2015.

## 2015-09-19 NOTE — Assessment & Plan Note (Signed)
Patient reports minimal appetite and poor oral intake since his last cycle of chemotherapy.  He feels dehydrated today.  He has also noted an increase in his heart rate within the past few days as well.  He denies any chest pain, chest pressure, shortness of breath, or pain with inspiration.  He denies any recent fevers or chills.  On exam.-Patient does appear fatigued, slightly weak, and dehydrated.    EKG obtained today revealed a sinus tachycardia with a rate of 106; with a QTC of 441.  Patient will receive IV fluid rehydration; as well as albumen while at the cancer Center today.  He was also encouraged to push fluids at home is much as possible.  Patient was advised to call/return or go directly to the emergency department for any worsening symptoms whatsoever.

## 2015-09-19 NOTE — ED Notes (Signed)
Patient transported to X-ray 

## 2015-09-20 ENCOUNTER — Inpatient Hospital Stay (HOSPITAL_COMMUNITY): Payer: Non-veteran care

## 2015-09-20 ENCOUNTER — Other Ambulatory Visit: Payer: Self-pay | Admitting: Radiology

## 2015-09-20 DIAGNOSIS — L539 Erythematous condition, unspecified: Secondary | ICD-10-CM

## 2015-09-20 DIAGNOSIS — C8330 Diffuse large B-cell lymphoma, unspecified site: Secondary | ICD-10-CM

## 2015-09-20 DIAGNOSIS — L8990 Pressure ulcer of unspecified site, unspecified stage: Secondary | ICD-10-CM

## 2015-09-20 DIAGNOSIS — R609 Edema, unspecified: Secondary | ICD-10-CM

## 2015-09-20 DIAGNOSIS — A419 Sepsis, unspecified organism: Secondary | ICD-10-CM

## 2015-09-20 DIAGNOSIS — E43 Unspecified severe protein-calorie malnutrition: Secondary | ICD-10-CM

## 2015-09-20 DIAGNOSIS — R509 Fever, unspecified: Secondary | ICD-10-CM

## 2015-09-20 LAB — BASIC METABOLIC PANEL
Anion gap: 7 (ref 5–15)
BUN: 9 mg/dL (ref 6–20)
CALCIUM: 8.1 mg/dL — AB (ref 8.9–10.3)
CO2: 25 mmol/L (ref 22–32)
CREATININE: 0.61 mg/dL (ref 0.61–1.24)
Chloride: 106 mmol/L (ref 101–111)
GFR calc Af Amer: >60 mL/min (ref >60)
GFR calc non Af Amer: >60 mL/min (ref >60)
GLUCOSE: 105 mg/dL — AB (ref 65–99)
Potassium: 3.4 mmol/L — ABNORMAL LOW (ref 3.5–5.1)
Sodium: 138 mmol/L (ref 135–145)

## 2015-09-20 LAB — CBC
HCT: 22.6 % — ABNORMAL LOW (ref 39.0–52.0)
HEMOGLOBIN: 7.5 g/dL — AB (ref 13.0–17.0)
MCH: 28.4 pg (ref 26.0–34.0)
MCHC: 33.2 g/dL (ref 30.0–36.0)
MCV: 85.6 fL (ref 78.0–100.0)
Platelets: 122 10*3/uL — ABNORMAL LOW (ref 150–400)
RBC: 2.64 MIL/uL — ABNORMAL LOW (ref 4.22–5.81)
RDW: 16.6 % — ABNORMAL HIGH (ref 11.5–15.5)
WBC: 3.1 10*3/uL — ABNORMAL LOW (ref 4.0–10.5)

## 2015-09-20 LAB — PREPARE RBC (CROSSMATCH)

## 2015-09-20 LAB — URINE CULTURE: Culture: NO GROWTH

## 2015-09-20 LAB — LACTIC ACID, PLASMA: Lactic Acid, Venous: 1.4 mmol/L (ref 0.5–2.0)

## 2015-09-20 LAB — BRAIN NATRIURETIC PEPTIDE: B NATRIURETIC PEPTIDE 5: 33.9 pg/mL (ref 0.0–100.0)

## 2015-09-20 MED ORDER — SODIUM CHLORIDE 0.9 % IV SOLN: Freq: Once | INTRAVENOUS | Status: DC

## 2015-09-20 MED ORDER — SODIUM CHLORIDE 0.9 % IV SOLN
INTRAVENOUS | Status: DC
  Administered 2015-09-20: 15:00:00 via INTRAVENOUS

## 2015-09-20 MED ORDER — HEPARIN BOLUS VIA INFUSION
3000.0000 [IU] | Freq: Once | INTRAVENOUS | Status: DC
  Filled 2015-09-20: qty 3000

## 2015-09-20 MED ORDER — POLYETHYLENE GLYCOL 3350 17 G PO PACK
17.0000 g | PACK | Freq: Two times a day (BID) | ORAL | Status: DC
  Administered 2015-09-20 – 2015-09-22 (×4): 17 g via ORAL

## 2015-09-20 MED ORDER — HEPARIN (PORCINE) IN NACL 100-0.45 UNIT/ML-% IJ SOLN
1450.0000 [IU]/h | INTRAMUSCULAR | Status: DC
  Administered 2015-09-20 – 2015-09-21 (×2): 1450 [IU]/h via INTRAVENOUS
  Filled 2015-09-20 (×2): qty 250

## 2015-09-20 MED ORDER — COLLAGENASE 250 UNIT/GM EX OINT
TOPICAL_OINTMENT | Freq: Every day | CUTANEOUS | Status: DC
  Administered 2015-09-21: 14:00:00 via TOPICAL
  Filled 2015-09-20: qty 30

## 2015-09-20 MED ORDER — PIPERACILLIN-TAZOBACTAM 3.375 G IVPB
3.3750 g | Freq: Three times a day (TID) | INTRAVENOUS | Status: DC
  Administered 2015-09-20 – 2015-09-21 (×4): 3.375 g via INTRAVENOUS
  Filled 2015-09-20 (×5): qty 50

## 2015-09-20 MED ORDER — VANCOMYCIN HCL IN DEXTROSE 1-5 GM/200ML-% IV SOLN
1000.0000 mg | Freq: Three times a day (TID) | INTRAVENOUS | Status: DC
  Administered 2015-09-20 – 2015-09-21 (×4): 1000 mg via INTRAVENOUS
  Filled 2015-09-20 (×5): qty 200

## 2015-09-20 MED ORDER — POTASSIUM CHLORIDE CRYS ER 20 MEQ PO TBCR
40.0000 meq | EXTENDED_RELEASE_TABLET | Freq: Two times a day (BID) | ORAL | Status: AC
  Administered 2015-09-20 (×2): 40 meq via ORAL
  Filled 2015-09-20 (×2): qty 2

## 2015-09-20 NOTE — Progress Notes (Signed)
ANTICOAGULATION CONSULT NOTE - Initial Consult  Pharmacy Consult for heparin Indication: new DVT  Allergies  Allergen Reactions  . Neulasta [Pegfilgrastim] Other (See Comments)    ARDS likely related to Neulasta    Patient Measurements: Height: 6' 0.99" (185.4 cm) Weight: 194 lb 14.2 oz (88.4 kg) (per MD note from 9/19) IBW/kg (Calculated) : 79.88 Heparin Dosing Weight: 88 kg  Vital Signs: Temp: 99.6 F (37.6 C) (09/28 0948) Temp Source: Oral (09/28 0948) BP: 118/67 mmHg (09/28 0948) Pulse Rate: 102 (09/28 0948)  Labs:  Recent Labs  09/18/15 1051 09/19/15 1926 09/20/15 0548  HGB 10.1* 8.7* 7.5*  HCT 30.9* 26.2* 22.6*  PLT 170 143* 122*  CREATININE 0.7 0.70 0.61    Estimated Creatinine Clearance: 98.5 mL/min (by C-G formula based on Cr of 0.61).   Medical History: Past Medical History  Diagnosis Date  . History of bleeding ulcers 1990's  . 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 Anxiety   . GERD (gastroesophageal reflux disease)   . H. pylori infection   . History of blood transfusion   . Bone cancer      tumor on C5  . Arm numbness 08/07/15    Limited movement due to tumor on spine  . Diffuse large B cell lymphoma     biposy 07/25/2105    Assessment: Patient's a 69 y.o M with diffuse large B cell lymphoma currently undergoing chemotherapy treatment who was admitted on 9/27 for fever and tachycardia.  LE doppler on 9/28 showed RLE DVT.  To start heparin for thrombosis.  - Hgb low at 7.5 (for 1 unit PRBC today), plt down 122, no bleeding noted per RN, hemoccult pending   Goal of Therapy:  Heparin level 0.3-0.7 units/ml Monitor platelets by anticoagulation protocol: Yes   Plan:  - d/c lovenox 40mg  SQ q24h (last dose given at 0100 on 9/28 - heparin drip at 1450 units/hr (per [place] method)--> will not bolus d/t low hgb - check 6 hour heparin level - monitor for s/s bleeding  Pham, Anh P 09/20/2015,2:35 PM

## 2015-09-20 NOTE — Evaluation (Signed)
Physical Therapy Evaluation Patient Details Name: Ethan Stewart MRN: [id number] DOB: 1946-12-16 Today's Date: 09/20/2015   History of Present Illness  69 yo male admitted with fever. hx of lymphoma, c5 corpectomy with residual R UE weakness 08/05/2015. Pt is from SNF where he completing his ST rehab.   Clinical Impression  On eval, pt required Min assist for mobility-walked ~100 feet with RW. Mobility improved since last admission. Pt still experiencing residual R UE weakness-OT following as well. Recommend return to SNF for continued ST rehab to maximize independence and safety with functional mobility.     Follow Up Recommendations SNF    Equipment Recommendations  None recommended by PT    Recommendations for Other Services       Precautions / Restrictions Precautions Precautions: Fall Restrictions Weight Bearing Restrictions: No      Mobility  Bed Mobility Overal bed mobility: Needs Assistance Bed Mobility: Sit to Supine       Sit to supine: Min guard   General bed mobility comments: close guard for safety  Transfers Overall transfer level: Needs assistance Equipment used: Rolling walker (2 wheeled) Transfers: Sit to/from Stand Sit to Stand: Min assist         General transfer comment: min assist to rise from recliner and verbal cues for hand placement and overall safety. Min guard to stand from 3in1.   Ambulation/Gait Ambulation/Gait assistance: Min guard Ambulation Distance (Feet): 100 Feet Assistive device: Rolling walker (2 wheeled) Gait Pattern/deviations: Step-through pattern;Decreased stride length     General Gait Details: close guard for safety.  Stairs            Wheelchair Mobility    Modified Rankin (Stroke Patients Only)       Balance     Sitting balance-Leahy Scale: Good     Standing balance support: Bilateral upper extremity supported;During functional activity Standing balance-Leahy Scale: Fair                                Pertinent Vitals/Pain Pain Assessment: No/denies pain    Home Living Family/patient expects to be discharged to:: Skilled nursing facility Living Arrangements: Spouse/significant other                    Prior Function Level of Independence: Needs assistance   Gait / Transfers Assistance Needed: ambulating with RW and +1 assist  ADL's / Homemaking Assistance Needed: pt states he was doing his own bath, dressing, grooming at SNF with stand by assist.         Hand Dominance   Dominant Hand: Left    Extremity/Trunk Assessment   Upper Extremity Assessment: Defer to OT evaluation RUE Deficits / Details: able to slightly lift R UE. AAROM WFl at shoulder. Using R UE to help with donning/doffing sock. Wife states this is better than he has been able to do. strength elbow/wrist grossly 3-3+/5; grip good.      LUE Deficits / Details: grossly 4+/5 strength throughout. Grip is good.    Lower Extremity Assessment: Generalized weakness RLE Deficits / Details: swelling and redness       Communication   Communication: No difficulties  Cognition Arousal/Alertness: Awake/alert Behavior During Therapy: WFL for tasks assessed/performed Overall Cognitive Status: Within Functional Limits for tasks assessed                      General Comments  Exercises        Assessment/Plan    PT Assessment Patient needs continued PT services  PT Diagnosis Difficulty walking;Generalized weakness   PT Problem List Decreased strength;Decreased range of motion;Decreased activity tolerance;Decreased balance;Decreased mobility;Decreased knowledge of use of DME  PT Treatment Interventions DME instruction;Gait training;Functional mobility training;Therapeutic activities;Patient/family education;Balance training;Therapeutic exercise   PT Goals (Current goals can be found in the Care Plan section) Acute Rehab PT Goals Patient Stated Goal: pt wants to keep  strength up while in the hospital. PT Goal Formulation: With patient/family Time For Goal Achievement: 10/04/15 Potential to Achieve Goals: Good    Frequency Min 3X/week   Barriers to discharge        Co-evaluation PT/OT/SLP Co-Evaluation/Treatment: Yes Reason for Co-Treatment: For patient/therapist safety PT goals addressed during session: Mobility/safety with mobility;Balance;Proper use of DME OT goals addressed during session: ADL's and self-care;Proper use of Adaptive equipment and DME       End of Session Equipment Utilized During Treatment: Gait belt Activity Tolerance: Patient tolerated treatment well Patient left: in chair;with call bell/phone within reach;with family/visitor present           Time: 1010-1035 PT Time Calculation (min) (ACUTE ONLY): 25 min   Charges:         PT G Codes:        Weston Anna, MPT Pager: [phone]

## 2015-09-20 NOTE — Progress Notes (Signed)
ANTIBIOTIC CONSULT NOTE - INITIAL  Pharmacy Consult for Vancomycin and Zosyn  Indication: sepsis  Allergies  Allergen Reactions  . Neulasta [Pegfilgrastim] Other (See Comments)    ARDS likely related to Neulasta    Patient Measurements:   Adjusted Body Weight:   Vital Signs: Temp: 100 F (37.8 C) (09/28 0055) Temp Source: Oral (09/28 0055) BP: 119/55 mmHg (09/28 0055) Pulse Rate: 95 (09/28 0055) Intake/Output from previous day:   Intake/Output from this shift:    Labs:  Recent Labs  09/18/15 1051 09/19/15 1926  WBC 7.2 4.6  HGB 10.1* 8.7*  PLT 170 143*  CREATININE 0.7 0.70   Estimated Creatinine Clearance: 98.5 mL/min (by C-G formula based on Cr of 0.7). No results for input(s): VANCOTROUGH, VANCOPEAK, VANCORANDOM, GENTTROUGH, GENTPEAK, GENTRANDOM, TOBRATROUGH, TOBRAPEAK, TOBRARND, AMIKACINPEAK, AMIKACINTROU, AMIKACIN in the last 72 hours.   Microbiology: Recent Results (from the past 720 hour(s))  Culture, blood (routine x 2)     Status: None   Collection Time: 08/21/15  5:25 PM  Result Value Ref Range Status   Specimen Description BLOOD LEFT HAND  Final   Special Requests IN PEDIATRIC BOTTLE  4CC  Final   Culture   Final    NO GROWTH 5 DAYS Performed at [facility]    Report Status 08/26/2015 FINAL  Final  Respiratory virus panel     Status: None   Collection Time: 08/21/15  5:26 PM  Result Value Ref Range Status   Source - RVPAN NOSE  Corrected   Respiratory Syncytial Virus A Negative Negative Final   Respiratory Syncytial Virus B Negative Negative Final   Influenza A Negative Negative Final   Influenza B Negative Negative Final   Parainfluenza 1 Negative Negative Final   Parainfluenza 2 Negative Negative Final   Parainfluenza 3 Negative Negative Final   Metapneumovirus Negative Negative Final   Rhinovirus Negative Negative Final   Adenovirus Negative Negative Final    Comment: (NOTE) Performed At: [facility] [place], [place] [id number] Lindon Romp MD YH:[id number]   Culture, blood (routine x 2)     Status: None   Collection Time: 08/21/15  5:35 PM  Result Value Ref Range Status   Specimen Description BLOOD LEFT HAND  Final   Special Requests BOTTLES DRAWN AEROBIC AND ANAEROBIC  6CC  Final   Culture   Final    NO GROWTH 5 DAYS Performed at [facility]    Report Status 08/26/2015 FINAL  Final  Culture, bal-quantitative     Status: None   Collection Time: 08/22/15 12:48 PM  Result Value Ref Range Status   Specimen Description BRONCHIAL ALVEOLAR LAVAGE  Final   Special Requests Immunocompromised  Final   Gram Stain   Final    RARE WBC PRESENT,BOTH PMN AND MONONUCLEAR NO SQUAMOUS EPITHELIAL CELLS SEEN NO ORGANISMS SEEN Performed at [facility] Count NO GROWTH Performed at [facility]   Final   Culture   Final    NO GROWTH 2 Note: CORRECTED RESULTS CALLED TO: RUBY WL 9 16@1130  BY PARDA Performed at [facility]    Report Status 08/25/2015 FINAL  Final  Fungus Culture with Smear     Status: None (Preliminary result)   Collection Time: 08/22/15 12:48 PM  Result Value Ref Range Status   Specimen Description BRONCHIAL ALVEOLAR LAVAGE  Final   Special Requests Immunocompromised  Final   Fungal Smear   Final  NO YEAST OR FUNGAL ELEMENTS SEEN Performed at [facility]    Culture   Final    CULTURE IN PROGRESS FOR FOUR WEEKS Performed at [facility]    Report Status PENDING  Incomplete  Pneumocystis smear by DFA     Status: None   Collection Time: 08/22/15 12:49 PM  Result Value Ref Range Status   Specimen Source-PJSRC BRONCHIAL ALVEOLAR LAVAGE  Final   Pneumocystis jiroveci Ag NEGATIVE  Final    Comment: Performed at [facility]  Respiratory virus panel     Status: None   Collection Time: 08/23/15 11:47 PM  Result Value Ref Range Status   Respiratory Syncytial Virus A Negative Negative Final    Respiratory Syncytial Virus B Negative Negative Final   Influenza A Negative Negative Final   Influenza B Negative Negative Final   Parainfluenza 1 Negative Negative Final   Parainfluenza 2 Negative Negative Final   Parainfluenza 3 Negative Negative Final   Metapneumovirus Negative Negative Final   Rhinovirus Negative Negative Final   Adenovirus Negative Negative Final    Comment: (NOTE) Performed At: [facility] [place], [place] [id number] Lindon Romp MD QM:[id number]   Culture, respiratory (NON-Expectorated)     Status: None   Collection Time: 08/28/15 10:54 AM  Result Value Ref Range Status   Specimen Description TRACHEAL ASPIRATE  Final   Special Requests NONE  Final   Gram Stain   Final    FEW WBC PRESENT,BOTH PMN AND MONONUCLEAR RARE SQUAMOUS EPITHELIAL CELLS PRESENT YEAST Performed at [facility]    Culture   Final    FEW CANDIDA ALBICANS Performed at [facility]    Report Status 08/30/2015 FINAL  Final    Medical History: Past Medical History  Diagnosis Date  . History of bleeding ulcers 1990's  . 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 Anxiety   . GERD (gastroesophageal reflux disease)   . H. pylori infection   . History of blood transfusion   . Bone cancer      tumor on C5  . Arm numbness 08/07/15    Limited movement due to tumor on spine  . Diffuse large B cell lymphoma     biposy 07/25/2105    Medications:  Anti-infectives    Start     Dose/Rate Route Frequency Ordered Stop   09/20/15 0600  piperacillin-tazobactam (ZOSYN) IVPB 3.375 g     3.375 g 12.5 mL/hr over 240 Minutes Intravenous 3 times per day 09/20/15 0350     09/20/15 0600  vancomycin (VANCOCIN) IVPB 1000 mg/200 mL premix     1,000 mg 200 mL/hr over 60 Minutes Intravenous Every 8 hours 09/20/15 0350     09/19/15 2330  piperacillin-tazobactam (ZOSYN) IVPB 3.375 g  Status:  Discontinued     3.375 g 100 mL/hr over 30 Minutes Intravenous  Once  09/19/15 2321 09/19/15 2326   09/19/15 2330  vancomycin (VANCOCIN) IVPB 1000 mg/200 mL premix  Status:  Discontinued     1,000 mg 200 mL/hr over 60 Minutes Intravenous  Once 09/19/15 2321 09/19/15 2326   09/19/15 2145  vancomycin (VANCOCIN) IVPB 1000 mg/200 mL premix     1,000 mg 200 mL/hr over 60 Minutes Intravenous  Once 09/19/15 2140 09/19/15 2356   09/19/15 2145  piperacillin-tazobactam (ZOSYN) IVPB 3.375 g     3.375 g 12.5 mL/hr over 240 Minutes Intravenous  Once 09/19/15 2140 09/19/15 2256     Assessment:  Patient with cancer and fever.  First dose of antibiotics already given.    Goal of Therapy:  Vancomycin trough level 15-20 mcg/ml  Zosyn based on renal function  Appropriate antibiotic dosing for renal function; eradication of infection   Plan:  Measure antibiotic drug levels at steady state Follow up culture results Vancomycin 1gm iv q8hr  Zosyn 3.375g IV Q8H infused over 4hrs.   Tyler Deis, Shea Stakes [place] 09/20/2015,3:52 AM

## 2015-09-20 NOTE — Progress Notes (Addendum)
Utilization Review completed.  

## 2015-09-20 NOTE — Evaluation (Signed)
Occupational Therapy Evaluation Patient Details Name: Ethan Stewart MRN: [id number] DOB: Nov 04, 1946 Today's Date: 09/20/2015    History of Present Illness 69 yo male admitted with fever. hx of lymphoma, c5 corpectomy with residual R UE weakness 08/05/2015. Pt is from SNF where he completing his ST rehab.    Clinical Impression   Pt is overall at min assist level with ADL. Wife and family present for session. Pt would benefit from continued OT to progress ADL independence for return to SNF.     Follow Up Recommendations  SNF (pt's wife states plan is to return to SNF)    Equipment Recommendations  Other (comment) (TBA next venue)    Recommendations for Other Services       Precautions / Restrictions Precautions Precautions: Fall Restrictions Weight Bearing Restrictions: No      Mobility Bed Mobility Overal bed mobility: Needs Assistance Bed Mobility: Sit to Supine       Sit to supine: Min guard   General bed mobility comments: close guard for safety  Transfers Overall transfer level: Needs assistance Equipment used: Rolling walker (2 wheeled) Transfers: Sit to/from Stand Sit to Stand: Min assist         General transfer comment: min assist to rise from recliner and verbal cues for hand placement and overall safety. Min guard to stand from 3in1.     Balance     Sitting balance-Leahy Scale: Good     Standing balance support: Bilateral upper extremity supported;During functional activity Standing balance-Leahy Scale: Fair                              ADL Overall ADL's : Needs assistance/impaired Eating/Feeding: Independent;Sitting   Grooming: Wash/dry hands;Set up;Sitting   Upper Body Bathing: Set up;Sitting   Lower Body Bathing: Minimal assistance;Sit to/from stand   Upper Body Dressing : Set up;Sitting   Lower Body Dressing: Minimal assistance;Sit to/from stand   Toilet Transfer: Minimal assistance;Ambulation;BSC;RW   Toileting-  Clothing Manipulation and Hygiene: Minimal assistance;Sit to/from stand         General ADL Comments: Pt has made good progress  since recent admission. Min assist to stand from recliner and min guard from 3in1. Cues for hand placement and to keep walker with him until he fully backs up to surface. Pt able to don/dof socks with bilateral UE and wife states he is using R UE much better in activity than he has been.      Vision     Perception     Praxis      Pertinent Vitals/Pain Pain Assessment: No/denies pain     Hand Dominance Left   Extremity/Trunk Assessment Upper Extremity Assessment Upper Extremity Assessment: Defer to OT evaluation RUE Deficits / Details: able to slightly lift R UE. AAROM WFl at shoulder. Using R UE to help with donning/doffing sock. Wife states this is better than he has been able to do. strength elbow/wrist grossly 3-3+/5; grip good.  LUE Deficits / Details: grossly 4+/5 strength throughout. Grip is good.    Lower Extremity Assessment Lower Extremity Assessment: Generalized weakness RLE Deficits / Details: swelling and redness       Communication Communication Communication: No difficulties   Cognition Arousal/Alertness: Awake/alert Behavior During Therapy: WFL for tasks assessed/performed Overall Cognitive Status: Within Functional Limits for tasks assessed                     General Comments  Exercises       Shoulder Instructions      Home Living Family/patient expects to be discharged to:: Skilled nursing facility Living Arrangements: Spouse/significant other                                      Prior Functioning/Environment Level of Independence: Needs assistance  Gait / Transfers Assistance Needed: ambulating with RW and +1 assist ADL's / Homemaking Assistance Needed: pt states he was doing his own bath, dressing, grooming at SNF with stand by assist.         OT Diagnosis: Generalized weakness    OT Problem List: Decreased strength;Decreased knowledge of use of DME or AE   OT Treatment/Interventions: Self-care/ADL training;Patient/family education;Therapeutic activities;DME and/or AE instruction    OT Goals(Current goals can be found in the care plan section) Acute Rehab OT Goals Patient Stated Goal: pt wants to keep strength up while in the hospital. OT Goal Formulation: With patient/family Time For Goal Achievement: 10/04/15 Potential to Achieve Goals: Good  OT Frequency: Min 2X/week   Barriers to D/C:            Co-evaluation PT/OT/SLP Co-Evaluation/Treatment: Yes Reason for Co-Treatment: For patient/therapist safety PT goals addressed during session: Mobility/safety with mobility;Balance;Proper use of DME OT goals addressed during session: ADL's and self-care;Proper use of Adaptive equipment and DME      End of Session Equipment Utilized During Treatment: Gait belt;Rolling walker  Activity Tolerance: Patient tolerated treatment well Patient left: in bed;with call bell/phone within reach;with family/visitor present   Time: 1010-1035 OT Time Calculation (min): 25 min Charges:  OT General Charges $OT Visit: 1 Procedure OT Evaluation $Initial OT Evaluation Tier I: 1 Procedure G-Codes:    Jules Schick  [phone] 09/20/2015, 11:03 AM

## 2015-09-20 NOTE — Progress Notes (Addendum)
*  Preliminary Results* Right lower extremity venous duplex completed. Right lower extremity is positive for acute deep vein thrombosis involving the right gastrocnemius and right soleal veins. There is no evidence of right Baker's cyst.  Incidental finding: There is a 3.5cm heterogenous area of the right groin, suggestive of an enlarged inguinal lymph node.  Preliminary results discussed with Judson Roch, RN.  09/20/2015 2:02 PM  Maudry Mayhew, RVT, RDCS, RDMS

## 2015-09-20 NOTE — Clinical Social Work Note (Signed)
Clinical Social Work Assessment  Patient Details  Name: Ethan Stewart MRN: [id number] Date of Birth: [birth year]  Date of referral:  09/20/15               Reason for consult:  Discharge Planning, Facility Placement                Permission sought to share information with:  Chartered certified accountant granted to share information::  Yes, Verbal Permission Granted  Name::        Agency::     Relationship::     Contact Information:     Housing/Transportation Living arrangements for the past 2 months:  [place], [place] of Information:  Patient, Spouse Patient Interpreter Needed:  None Criminal Activity/Legal Involvement Pertinent to Current Situation/Hospitalization:  No - Comment as needed Significant Relationships:    Lives with:  Spouse Do you feel safe going back to the place where you live?   (Pt plans to return to SNF at d/c.) Need for family participation in patient care:  Yes (Comment)  Care giving concerns:  No concerns reported by pt / spouse at this time.   Social Worker assessment / plan:  Pt hospitalized on 09/19/15 with cellulitis. Pt had recently been sent to [facility] for rehab following d/c from [facility]. CSW met with pt / spouse to assist with d/c planning. Pt would like to return to [facility] at d/c to finish his rehab prior to returning home. SNF contacted, and clinicals sent for review. SNF will readmit at d/c pending [facility] adv. Medicare authorization. CSW will assist with this authorization process. PT is recommending SNF placement at d/c.  Employment status:  Retired Nurse, adult PT Recommendations:  [place] / Referral to community resources:  [place]  Patient/Family's Response to care:  Pt / spouse feel SNF placement is needed at d/c.  Patient/Family's Understanding of and Emotional Response to Diagnosis, Current  Treatment, and Prognosis: Pt / spouse have been pleased with care received at SNF. Pt is disappointed to be back in the hospital so soon. He is looking forward to returning to SNF to complete rehab.  Emotional Assessment Appearance:  Appears stated age Attitude/Demeanor/Rapport:  Other (cooperative) Affect (typically observed):  Appropriate, Pleasant, Calm Orientation:  Oriented to Self, Oriented to Place, Oriented to  Time, Oriented to Situation Alcohol / Substance use:  Not Applicable Psych involvement (Current and /or in the community):  No (Comment)  Discharge Needs  Concerns to be addressed:  Discharge Planning Concerns Readmission within the last 30 days:  Yes Current discharge risk:  None Barriers to Discharge:  No Barriers Identified   Luretha Rued, [place] 09/20/2015, 4:07 PM

## 2015-09-20 NOTE — Progress Notes (Addendum)
[facility] PROGRESS NOTE Assessment/Plan: Fever likely due to right lower extremity cellulitis: He was started empirically on vancomycin and Zosyn on 09/19/2015. Cultures have remained negative, has defervesced, with his white blood cell count neutropenic now. Try to keep the leg elevated above the heart level PT evaluation pending  Protein-calorie malnutrition, severe Continue nutritional supplements  DLBCL (diffuse large B cell lymphoma) Continue allopurinol.  Antineoplastic-related anemia/pancytopenia: The patient has responded to pegfilgrastim. Transfuse him 1 unit of PRBC. Pt had a large BM was not black. Will check FOBT.   MDD (major depressive disorder), recurrent severe, without psychosis: Stable Continue mirtazapine  Constipation: Pt requested an enema. Increase miralax to BID.  Lower ext DVT: Lower ext doppler was positive for Right lower ext DVT Start heparin per pharmacy.  Code Status: DNR Family Communication: Wife and son at bedside Disposition Plan: Inpatient probably home in 2-3 days.   Consultants:  none  Procedures:  none  Antibiotics:  vanc and zosyn  HPI/Subjective: He relates he still feels weak, has not had a BM in several days  Objective: Filed Vitals:   09/20/15 0055 09/20/15 0115 09/20/15 0537 09/20/15 0948  BP: 119/55 123/53 133/61 118/67  Pulse: 95 93 88 102  Temp: 100 F (37.8 C) 97.9 F (36.6 C) 100.2 F (37.9 C) 99.6 F (37.6 C)  TempSrc: Oral Oral Oral Oral  Resp: 25 26 22 22   Height:      Weight:      SpO2:  96% 97% 98%    Intake/Output Summary (Last 24 hours) at 09/20/15 0952 Last data filed at 09/20/15 0840  Gross per 24 hour  Intake 996.67 ml  Output   1000 ml  Net  -3.33 ml   Filed Weights   09/19/15 2305  Weight: 88.4 kg (194 lb 14.2 oz)    Exam:  General: Alert, awake, oriented x3, in no acute distress.  HEENT: No bruits, no goiter.  Heart: Regular rate and rhythm. Lungs: Good air  movement, clear Abdomen: Soft, nontender, nondistended, positive bowel sounds.  Neuro: Grossly intact, nonfocal.   Data Reviewed: Basic Metabolic Panel:  Recent Labs Lab 09/18/15 1051 09/19/15 1926 09/20/15 0548  NA 140 136 138  K 4.2 4.1 3.4*  CL  --  104 106  CO2 27 23 25   GLUCOSE 107 98 105*  BUN 9.4 12 9   CREATININE 0.7 0.70 0.61  CALCIUM 9.1 8.4* 8.1*   Liver Function Tests:  Recent Labs Lab 09/18/15 1051 09/19/15 1926  AST 15 26  ALT 24 24  ALKPHOS 107 76  BILITOT 0.61 0.8  PROT 6.0* 5.7*  ALBUMIN 2.7* 2.8*   No results for input(s): LIPASE, AMYLASE in the last 168 hours. No results for input(s): AMMONIA in the last 168 hours. CBC:  Recent Labs Lab 09/18/15 1051 09/19/15 1926 09/20/15 0548  WBC 7.2 4.6 3.1*  NEUTROABS 6.5 3.9  --   HGB 10.1* 8.7* 7.5*  HCT 30.9* 26.2* 22.6*  MCV 87.0 86.8 85.6  PLT 170 143* 122*   Cardiac Enzymes: No results for input(s): CKTOTAL, CKMB, CKMBINDEX, TROPONINI in the last 168 hours. BNP (last 3 results)  Recent Labs  08/21/15 1725 09/20/15 0008  BNP 86.7 33.9    ProBNP (last 3 results) No results for input(s): PROBNP in the last 8760 hours.  CBG: No results for input(s): GLUCAP in the last 168 hours.  No results found for this or any previous visit (from the past 240 hour(s)).   Studies: Dg Chest 2  View  09/19/2015   CLINICAL DATA:  Pt c/o weakness, fever, sob, bilateral lower extremity swelling x several days, pt undergoing chemotherapy.  EXAM: CHEST  2 VIEW  COMPARISON:  09/01/2015  FINDINGS: Heart size is accentuated by the AP position of patient. There is persistent patchy density at the lung bases bilaterally, consistent with atelectasis or infiltrates. No pleural effusions. Aeration has improved over recent studies. Previous cervical fusion.  IMPRESSION: Persistent bilateral lower lobe atelectasis or infiltrates.   Electronically Signed   By: Nolon Nations M.D.   On: 09/19/2015 19:57    Scheduled  Meds: . allopurinol  300 mg Oral Daily  . enoxaparin (LOVENOX) injection  40 mg Subcutaneous QHS  . feeding supplement (PRO-STAT SUGAR FREE 64)  30 mL Oral BID WC  . mirtazapine  15 mg Oral QHS  . nutrition supplement (JUVEN)  1 packet Oral BID BM  . oxandrolone  5 mg Oral BID  . pantoprazole  40 mg Oral Daily  . piperacillin-tazobactam (ZOSYN)  IV  3.375 g Intravenous 3 times per day  . polyethylene glycol  17 g Oral Daily  . senna-docusate  2 tablet Oral BID  . vancomycin  1,000 mg Intravenous Q8H   Continuous Infusions: . dextrose 5 % and 0.45% NaCl 100 mL/hr at 09/20/15 0103    Time Spent: 25 min   Charlynne Cousins  [facility] Pager [phone].  If 7PM-7AM, please contact night-coverage at [email or web address], password [facility] 09/20/2015, 9:52 AM  LOS: 1 day

## 2015-09-20 NOTE — Progress Notes (Signed)
MD paged about positive DVT from venous doppler scan.

## 2015-09-20 NOTE — Consult Note (Signed)
WOC wound consult note Reason for Consult: Patient known to me from previous admission; seen approximately 1 moth ago. Deep tissue pressure injury has evolved to an Unstageable Pressure Injury with the depth obscurred by the presence of yellow/white necrotic tissue Wound type: Pressure Pressure Ulcer POA: Yes Measurement: 4cm x 2cm with depth undetermined due to necrotic tissue obscuring base. Wound bed:As above Drainage (amount, consistency, odor) Scant light yellow, no odor Periwound: erythematous with presentation consistent with fungal overgrowth (confluent red center and satellite lesions at periphery) Dressing procedure/placement/frequency: I have provided guidance for nursing staff to turn patient from side to side and avoid the supine position except for meals.  Heels are to be floated.  We will start collagenase (Santyl) as an enzymatic debriding agent tonight and continue daily; PT is provided for hydrotherapy and will perform the dressing change after treatment.  They do not provide the service on Sunday, so nursing will perform wound care on Sundays. Finally, a pressure redistribution chair pad for when patient is OOB in chair is provided. [place] nursing team will not follow, but will remain available to this patient, the nursing and medical teams.  Please re-consult if needed. Thanks, Maudie Flakes, MSN, RN, [place], [place], [place] [phone])

## 2015-09-21 ENCOUNTER — Ambulatory Visit: Payer: PPO

## 2015-09-21 ENCOUNTER — Encounter: Payer: Self-pay | Admitting: *Deleted

## 2015-09-21 ENCOUNTER — Other Ambulatory Visit: Payer: PPO

## 2015-09-21 ENCOUNTER — Other Ambulatory Visit: Payer: Self-pay | Admitting: *Deleted

## 2015-09-21 DIAGNOSIS — L03115 Cellulitis of right lower limb: Secondary | ICD-10-CM

## 2015-09-21 DIAGNOSIS — I824Z1 Acute embolism and thrombosis of unspecified deep veins of right distal lower extremity: Secondary | ICD-10-CM

## 2015-09-21 DIAGNOSIS — F332 Major depressive disorder, recurrent severe without psychotic features: Secondary | ICD-10-CM

## 2015-09-21 LAB — CBC
HCT: 26.5 % — ABNORMAL LOW (ref 39.0–52.0)
HEMOGLOBIN: 8.8 g/dL — AB (ref 13.0–17.0)
MCH: 28 pg (ref 26.0–34.0)
MCHC: 33.2 g/dL (ref 30.0–36.0)
MCV: 84.4 fL (ref 78.0–100.0)
Platelets: 118 10*3/uL — ABNORMAL LOW (ref 150–400)
RBC: 3.14 MIL/uL — ABNORMAL LOW (ref 4.22–5.81)
RDW: 16.4 % — AB (ref 11.5–15.5)
WBC: 3 10*3/uL — AB (ref 4.0–10.5)

## 2015-09-21 LAB — TYPE AND SCREEN
ABO/RH(D): B NEG
ANTIBODY SCREEN: NEGATIVE
UNIT DIVISION: 0

## 2015-09-21 LAB — HEPARIN LEVEL (UNFRACTIONATED)
HEPARIN UNFRACTIONATED: 0.63 [IU]/mL (ref 0.30–0.70)
HEPARIN UNFRACTIONATED: 0.48 [IU]/mL (ref 0.30–0.70)

## 2015-09-21 LAB — OCCULT BLOOD X 1 CARD TO LAB, STOOL: FECAL OCCULT BLD: NEGATIVE

## 2015-09-21 MED ORDER — APIXABAN 5 MG PO TABS
10.0000 mg | ORAL_TABLET | Freq: Two times a day (BID) | ORAL | Status: DC
  Administered 2015-09-21 – 2015-09-22 (×3): 10 mg via ORAL
  Filled 2015-09-21 (×4): qty 2

## 2015-09-21 MED ORDER — SULFAMETHOXAZOLE-TRIMETHOPRIM 800-160 MG PO TABS
1.0000 | ORAL_TABLET | Freq: Two times a day (BID) | ORAL | Status: DC
  Administered 2015-09-21 – 2015-09-22 (×3): 1 via ORAL
  Filled 2015-09-21 (×4): qty 1

## 2015-09-21 MED ORDER — MORPHINE SULFATE (PF) 4 MG/ML IV SOLN
4.0000 mg | INTRAVENOUS | Status: DC | PRN
  Administered 2015-09-21 (×2): 2 mg via INTRAVENOUS
  Administered 2015-09-22: 4 mg via INTRAVENOUS
  Filled 2015-09-21 (×2): qty 1

## 2015-09-21 NOTE — Care Management Note (Signed)
Case Management Note  Patient Details  Name: WINDEL KEZIAH MRN: [id number] Date of Birth: 14-Mar-1946  Subjective/Objective:                   right lower extremity cellulitis Action/Plan: Discharge planning  Expected Discharge Date:                  Expected Discharge Plan:  [place]  In-House Referral:     Discharge planning Services  CM Consult, Medication Assistance  Post Acute Care Choice:    Choice offered to:     DME Arranged:    DME Agency:     HH Arranged:    HH Agency:     Status of Service:  In process, will continue to follow  Medicare Important Message Given:    Date Medicare IM Given:    Medicare IM give by:    Date Additional Medicare IM Given:    Additional Medicare Important Message give by:     If discussed at [place] of Stay Meetings, dates discussed:    Additional Comments: CM gave pt free 30 day trail care for Eliquis.  CM received benefit check back and texted MD:        Eliquis only comes in 2.5 mg and 5 mg   120/30 : $4.50 for 30 days.  No preferred pharmacy  No pre-auth required.     CM will continue to follow for needs. Dellie Catholic, RN 09/21/2015, 5:02 PM

## 2015-09-21 NOTE — Progress Notes (Signed)
ANTICOAGULATION CONSULT NOTE - F/u Consult  Pharmacy Consult for heparin >> Eliquis Indication: new DVT  Allergies  Allergen Reactions  . Neulasta [Pegfilgrastim] Other (See Comments)    ARDS likely related to Neulasta    Patient Measurements: Height: 6' 0.99" (185.4 cm) Weight: 194 lb 14.2 oz (88.4 kg) (per MD note from 9/19) IBW/kg (Calculated) : 79.88  Vital Signs: Temp: 98 F (36.7 C) (09/29 0531) Temp Source: Oral (09/29 0531) BP: 135/70 mmHg (09/29 0531) Pulse Rate: 88 (09/29 0531)  Labs:  Recent Labs  09/18/15 1051  09/19/15 1926 09/20/15 0548 09/20/15 2353 09/21/15 0440  HGB 10.1*  < > 8.7* 7.5*  --  8.8*  HCT 30.9*  --  26.2* 22.6*  --  26.5*  PLT 170  --  143* 122*  --  118*  HEPARINUNFRC  --   --   --   --  0.48 0.63  CREATININE 0.7  --  0.70 0.61  --   --   < > = values in this interval not displayed.  Estimated Creatinine Clearance: 98.5 mL/min (by C-G formula based on Cr of 0.61).   Medical History: Past Medical History  Diagnosis Date  . History of bleeding ulcers 1990's  . 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 Anxiety   . GERD (gastroesophageal reflux disease)   . H. pylori infection   . History of blood transfusion   . Bone cancer      tumor on C5  . Arm numbness 08/07/15    Limited movement due to tumor on spine  . Diffuse large B cell lymphoma     biposy 07/25/2105    Assessment: 69 y.o M with recent diagnosis DLCBL currently undergoing chemotherapy treatment with R-CHOP who was admitted on 9/27 for fever, tachycardia, and cellulitis vs VTE of RLE.  Venous duplex on 9/28 confirms DVT and started on heparin per pharmacy.  To transition today to Eliquis for outpatient management   Today, 09/21/2015:  CBC: Hgb low at baseline but improved s/p PRBC x 1; Plt lower.  Most likely chemo-related but FOBT ordered  Most recent heparin level therapeutic on 1450 units/hr, although nearing high end of range  No drug-drug interactions with Eliquis  noted  No bleeding or infusion issues per nursing  CrCl: wnl  Goal of Therapy: Heparin level 0.3-0.7 units/ml Monitor platelets by anticoagulation protocol: Yes  Plan:  Stop heparin at 1100 today  Start Eliquis 10 mg PO bid at same time heparin is turned off. 10 mg BID regimen is to continue for 7 days, after which patient should transition to 5 mg BID.  CBC/SCr at least q72 hr while on Eliquis inpatient  Monitor for signs of bleeding and resolution of DVT   Reuel Boom, PharmD Pager: [phone] 09/21/2015, 7:11 AM

## 2015-09-21 NOTE — Patient Outreach (Signed)
[place] [facility]) Care Management  09/21/2015  Ethan Stewart 1946/07/10 [id number]  CSW arrived at [facility] [place] where patient was residing to receive rehabilitative services, only to learn that patient was not there.  After a brief discussion with the admissions coordinator at [facility] [place] learned that patient was admitted to [facility] on September 27th for Cellulitis, where patient continues to reside today.  CSW then contacted patient's wife, Ethan Stewart to obtain an update on patient's current medical status.  CSW was also able to perform a thorough review of patient's EMR (Electronic Medical Record) in [employer].  According to Mrs. Ethan Stewart, patient plans to return to [facility] to complete rehabilitative services, upon discharge from the hospital.  Patient's physical therapist at the hospital has recommended that patient complete his therapies at [facility], prior to returning home to live independently with home health services arranged.  Ethan Stewart indicated that patient has been prescribed IV (Intravenous) antibiotics (Vancomycin and Zosyn) and hopes to be able to return to [facility] within the next few days.  CSW was able to confirm that [place] [facility] is willing to accept patient back.  Verification of patient's benefits through [employer] has been obtained and authorization number received.  CSW will continue to follow patient while hospitalized, as well as at skilled nursing facility to assist with possible discharge planning needs and services.  CSW will plan to meet with patient at [facility] for next scheduled visit on Thursday, October 6th.  Ethan Stewart, BSW, MSW, LCSW  Licensed Education officer, [employer]  Mailing [place] [address] [place], [place] [address] Physical Address-[address], [place], [place] [address] Toll Free Main # [phone] Fax # [phone] Cell  # [phone]  Fax # [phone]  [email or web address]

## 2015-09-21 NOTE — Progress Notes (Signed)
HYDROTHERAPY EVALUATION     09/21/15 1600  Subjective Assessment  Subjective (I hope this helps)  Patient and Family Stated Goals (for wound to heal)  Evaluation and Treatment  Evaluation and Treatment Procedures Explained to Patient/Family Yes  Evaluation and Treatment Procedures agreed to  Pressure Ulcer 09/19/15 Stage II -  Partial thickness loss of dermis presenting as a shallow open ulcer with a red, pink wound bed without slough.  Date First Assessed/Time First Assessed: 09/19/15 2305   Location: Sacrum  Location Orientation: Posterior  Staging: Stage II -  Partial thickness loss of dermis presenting as a shallow open ulcer with a red, pink wound bed without slough.  Present on...  Dressing Type (Comment) (Santyl, wet-to-dry, Allevyn)  Dressing Change Frequency Daily  Site / Wound Assessment Yellow;Granulation tissue  % Wound base Red or Granulating 5%  % Wound base Yellow 95%  Peri-wound Assessment Maceration;Erythema (non-blanchable);Induration  Wound Length (cm) 4.5 cm   Wound Width (cm) 2.5 cm   Wound Depth (cm) 0.5 cm  Drainage Amount Minimal  Drainage Description No odor; No dressing in place; No drainage noted during tx  Wound Therapy - Assess/Plan/Recommendations  Wound Therapy - Clinical Statement 69 yo male with unstageable sacral ulcer. Pt could possibly benefit from pulsed lavage and enzymatic debridement of wound to facilitate healing  Wound Therapy - Functional Problem List (Decreased mobility due to weakness)  Factors Delaying/Impairing Wound Healing Immobility  Hydrotherapy Plan Debridement;Dressing change;Pulsatile lavage with suction  Wound Therapy - Frequency 6X / week  Wound Therapy - Follow Up Recommendations Skilled nursing facility  Wound Therapy Goals - Improve the function of patient's integumentary system by progressing the wound(s) through the phases of wound healing by:  Decrease Necrotic Tissue to 75%  Increase Granulation Tissue to 25%   Decrease Length/Width/Depth by (cm) 0  Goals/treatment plan/discharge plan were made with and agreed upon by patient/family Yes  Time For Goal Achievement 2 weeks  Wound Therapy - Potential for Goals Fair   Weston Anna, MPT [phone]

## 2015-09-21 NOTE — Progress Notes (Signed)
Physical Therapy Treatment Patient Details Name: Ethan Stewart MRN: [id number] DOB: [birth year] Today's Date: 09/21/2015    History of Present Illness 69 yo male admitted with fever. hx of lymphoma, c5 corpectomy with residual R UE weakness 08/05/2015. Pt is from SNF where he completing his ST rehab.     PT Comments    Progressing with mobility.   Follow Up Recommendations  SNF     Equipment Recommendations  None recommended by PT    Recommendations for Other Services       Precautions / Restrictions Precautions Precautions: Fall Restrictions Weight Bearing Restrictions: No    Mobility  Bed Mobility Overal bed mobility: Needs Assistance Bed Mobility: Sit to Supine       Sit to supine: Min guard   General bed mobility comments: close guard for safety  Transfers Overall transfer level: Needs assistance Equipment used: Rolling walker (2 wheeled) Transfers: Sit to/from Stand Sit to Stand: Min guard         General transfer comment: VCs safety. Very close guard. Pt moves impulsively at times  Ambulation/Gait Ambulation/Gait assistance: Min guard Ambulation Distance (Feet): 125 Feet Assistive device: Rolling walker (2 wheeled) Gait Pattern/deviations: Step-through pattern;Decreased stride length     General Gait Details: close guard for safety.   Stairs            Wheelchair Mobility    Modified Rankin (Stroke Patients Only)       Balance           Standing balance support: During functional activity Standing balance-Leahy Scale: Fair                      Cognition Arousal/Alertness: Awake/alert Behavior During Therapy: WFL for tasks assessed/performed Overall Cognitive Status: Within Functional Limits for tasks assessed                      Exercises      General Comments        Pertinent Vitals/Pain Pain Assessment: No/denies pain    Home Living                      Prior Function             PT Goals (current goals can now be found in the care plan section) Progress towards PT goals: Progressing toward goals    Frequency  Min 3X/week    PT Plan Current plan remains appropriate    Co-evaluation             End of Session Equipment Utilized During Treatment: Gait belt Activity Tolerance: Patient tolerated treatment well Patient left: in bed;with call bell/phone within reach;with family/visitor present;with bed alarm set     Time: 2878-6767 PT Time Calculation (min) (ACUTE ONLY): 19 min  Charges:  $Gait Training: 8-22 mins                    G Codes:      Weston Anna, MPT Pager: [phone]

## 2015-09-21 NOTE — Progress Notes (Signed)
ANTICOAGULATION CONSULT NOTE - F/u Consult  Pharmacy Consult for heparin Indication: new DVT  Allergies  Allergen Reactions  . Neulasta [Pegfilgrastim] Other (See Comments)    ARDS likely related to Neulasta    Patient Measurements: Height: 6' 0.99" (185.4 cm) Weight: 194 lb 14.2 oz (88.4 kg) (per MD note from 9/19) IBW/kg (Calculated) : 79.88 Heparin Dosing Weight: 88 kg  Vital Signs: Temp: 98.1 F (36.7 C) (09/28 2310) Temp Source: Oral (09/28 2310) BP: 110/88 mmHg (09/28 2310) Pulse Rate: 92 (09/28 2310)  Labs:  Recent Labs  09/18/15 1051 09/19/15 1926 09/20/15 0548 09/20/15 2353  HGB 10.1* 8.7* 7.5*  --   HCT 30.9* 26.2* 22.6*  --   PLT 170 143* 122*  --   HEPARINUNFRC  --   --   --  0.48  CREATININE 0.7 0.70 0.61  --     Estimated Creatinine Clearance: 98.5 mL/min (by C-G formula based on Cr of 0.61).   Medical History: Past Medical History  Diagnosis Date  . History of bleeding ulcers 1990's  . 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 Anxiety   . GERD (gastroesophageal reflux disease)   . H. pylori infection   . History of blood transfusion   . Bone cancer      tumor on C5  . Arm numbness 08/07/15    Limited movement due to tumor on spine  . Diffuse large B cell lymphoma     biposy 07/25/2105    Assessment: Patient's a 69 y.o M with diffuse large B cell lymphoma currently undergoing chemotherapy treatment who was admitted on 9/27 for fever and tachycardia.  LE doppler on 9/28 showed RLE DVT.  To start heparin for thrombosis.  - Hgb low at 7.5 (for 1 unit PRBC today), plt down 122, no bleeding noted per RN, hemoccult pending 9/28  LD Lovenox 40mg  @ 0100 9/28  Heparin drip @ 1450 units/hr (no bolus d/t low hgb)  2353 HL= 0.48 , no problems per RN  Goal of Therapy:  Heparin level 0.3-0.7 units/ml Monitor platelets by anticoagulation protocol: Yes   Plan:  -Continue heparin drip at 1450 units/hr (per [place] method) - Recheck HL with am  labs - monitor for s/s bleeding  Dorrene German 09/21/2015,12:51 AM

## 2015-09-21 NOTE — Progress Notes (Signed)
[facility] PROGRESS NOTE Assessment/Plan: Fever likely due to right lower extremity cellulitis: He was started empirically on vancomycin and Zosyn on 09/19/2015. De-esclate antibiotic regimen to bactrim PT evaluation recommended skilled nursing facility  Protein-calorie malnutrition, severe Continue nutritional supplements  DLBCL (diffuse large B cell lymphoma) Continue allopurinol.  Antineoplastic-related anemia/pancytopenia: The patient has responded to pegfilgrastim. Hemoglobin has improved, continue to monitor bowel movements. Will check FOBT.   MDD (major depressive disorder), recurrent severe, without psychosis: Stable Continue mirtazapine  Constipation: Resolved continue MiraLAX twice a day.  Lower ext DVT: Lower ext doppler was positive for Right lower ext DVT Started on 09/12/2015 no bowel movements of bright red blood per rectum we had a long discussion about risk and benefits of anticoagulants, we have also discussed new agent versus Coumadin. He decided on one of the new agents will stop heparin and start Eluquis.  Code Status: DNR Family Communication: Wife and son at bedside Disposition Plan: Inpatient probably home in 2-3 days.   Consultants:  none  Procedures:  none  Antibiotics:  vanc and zosyn  Bactrim  HPI/Subjective: Had several bowel movements, feels better though brown in color no bright red blood per rectum.  Objective: Filed Vitals:   09/20/15 2010 09/20/15 2037 09/20/15 2310 09/21/15 0531  BP: 118/68 112/62 110/88 135/70  Pulse: 99 95 92 88  Temp: 98.7 F (37.1 C) 100.2 F (37.9 C) 98.1 F (36.7 C) 98 F (36.7 C)  TempSrc: Oral Oral Oral Oral  Resp: 22 22 20 22   Height:      Weight:      SpO2: 99% 96% 99% 96%    Intake/Output Summary (Last 24 hours) at 09/21/15 0935 Last data filed at 09/21/15 0800  Gross per 24 hour  Intake 1179.26 ml  Output    700 ml  Net 479.26 ml   Filed Weights   09/19/15 2305    Weight: 88.4 kg (194 lb 14.2 oz)    Exam:  General: Alert, awake, oriented x3, in no acute distress.  HEENT: No bruits, no goiter.  Heart: Regular rate and rhythm. Lungs: Good air movement, clear, no rales Abdomen: Soft, nontender, nondistended, positive bowel sounds.  Skin: Lower extremity is less edematous and less erythematous not warm to touch.   Data Reviewed: Basic Metabolic Panel:  Recent Labs Lab 09/18/15 1051 09/19/15 1926 09/20/15 0548  NA 140 136 138  K 4.2 4.1 3.4*  CL  --  104 106  CO2 27 23 25   GLUCOSE 107 98 105*  BUN 9.4 12 9   CREATININE 0.7 0.70 0.61  CALCIUM 9.1 8.4* 8.1*   Liver Function Tests:  Recent Labs Lab 09/18/15 1051 09/19/15 1926  AST 15 26  ALT 24 24  ALKPHOS 107 76  BILITOT 0.61 0.8  PROT 6.0* 5.7*  ALBUMIN 2.7* 2.8*   No results for input(s): LIPASE, AMYLASE in the last 168 hours. No results for input(s): AMMONIA in the last 168 hours. CBC:  Recent Labs Lab 09/18/15 1051 09/19/15 1926 09/20/15 0548 09/21/15 0440  WBC 7.2 4.6 3.1* 3.0*  NEUTROABS 6.5 3.9  --   --   HGB 10.1* 8.7* 7.5* 8.8*  HCT 30.9* 26.2* 22.6* 26.5*  MCV 87.0 86.8 85.6 84.4  PLT 170 143* 122* 118*   Cardiac Enzymes: No results for input(s): CKTOTAL, CKMB, CKMBINDEX, TROPONINI in the last 168 hours. BNP (last 3 results)  Recent Labs  08/21/15 1725 09/20/15 0008  BNP 86.7 33.9    ProBNP (last 3 results)  No results for input(s): PROBNP in the last 8760 hours.  CBG: No results for input(s): GLUCAP in the last 168 hours.  Recent Results (from the past 240 hour(s))  Culture, blood (routine x 2)     Status: None (Preliminary result)   Collection Time: 09/19/15  7:19 PM  Result Value Ref Range Status   Specimen Description BLOOD RIGHT HAND  Final   Special Requests BOTTLES DRAWN AEROBIC AND ANAEROBIC 5CC  Final   Culture   Final    NO GROWTH < 24 HOURS Performed at [facility]    Report Status PENDING  Incomplete  Culture, blood  (routine x 2)     Status: None (Preliminary result)   Collection Time: 09/19/15  7:26 PM  Result Value Ref Range Status   Specimen Description BLOOD LEFT HAND  Final   Special Requests BOTTLES DRAWN AEROBIC AND ANAEROBIC 5CC  Final   Culture   Final    NO GROWTH < 24 HOURS Performed at [facility]    Report Status PENDING  Incomplete  Urine culture     Status: None   Collection Time: 09/19/15  7:55 PM  Result Value Ref Range Status   Specimen Description URINE, CLEAN CATCH  Final   Special Requests NONE  Final   Culture   Final    NO GROWTH 1 DAY Performed at [facility]    Report Status 09/20/2015 FINAL  Final     Studies: Dg Chest 2 View  09/19/2015   CLINICAL DATA:  Pt c/o weakness, fever, sob, bilateral lower extremity swelling x several days, pt undergoing chemotherapy.  EXAM: CHEST  2 VIEW  COMPARISON:  09/01/2015  FINDINGS: Heart size is accentuated by the AP position of patient. There is persistent patchy density at the lung bases bilaterally, consistent with atelectasis or infiltrates. No pleural effusions. Aeration has improved over recent studies. Previous cervical fusion.  IMPRESSION: Persistent bilateral lower lobe atelectasis or infiltrates.   Electronically Signed   By: Nolon Nations M.D.   On: 09/19/2015 19:57    Scheduled Meds: . sodium chloride   Intravenous Once  . allopurinol  300 mg Oral Daily  . collagenase   Topical Daily  . feeding supplement (PRO-STAT SUGAR FREE 64)  30 mL Oral BID WC  . mirtazapine  15 mg Oral QHS  . nutrition supplement (JUVEN)  1 packet Oral BID BM  . oxandrolone  5 mg Oral BID  . pantoprazole  40 mg Oral Daily  . piperacillin-tazobactam (ZOSYN)  IV  3.375 g Intravenous 3 times per day  . polyethylene glycol  17 g Oral BID  . senna-docusate  2 tablet Oral BID  . vancomycin  1,000 mg Intravenous Q8H   Continuous Infusions: . sodium chloride 10 mL/hr at 09/20/15 1439  . heparin 1,450 Units/hr (09/21/15 0912)     Time Spent: 25 min   Charlynne Cousins  [facility] Pager [phone].  If 7PM-7AM, please contact night-coverage at [email or web address], password [facility] 09/21/2015, 9:35 AM  LOS: 2 days

## 2015-09-22 DIAGNOSIS — C8338 Diffuse large B-cell lymphoma, lymph nodes of multiple sites: Secondary | ICD-10-CM

## 2015-09-22 DIAGNOSIS — K219 Gastro-esophageal reflux disease without esophagitis: Secondary | ICD-10-CM

## 2015-09-22 DIAGNOSIS — L0390 Cellulitis, unspecified: Secondary | ICD-10-CM

## 2015-09-22 DIAGNOSIS — I82401 Acute embolism and thrombosis of unspecified deep veins of right lower extremity: Secondary | ICD-10-CM

## 2015-09-22 DIAGNOSIS — L89159 Pressure ulcer of sacral region, unspecified stage: Secondary | ICD-10-CM

## 2015-09-22 DIAGNOSIS — M6250 Muscle wasting and atrophy, not elsewhere classified, unspecified site: Secondary | ICD-10-CM

## 2015-09-22 DIAGNOSIS — K2970 Gastritis, unspecified, without bleeding: Secondary | ICD-10-CM

## 2015-09-22 DIAGNOSIS — R609 Edema, unspecified: Secondary | ICD-10-CM | POA: Insufficient documentation

## 2015-09-22 DIAGNOSIS — I824Z1 Acute embolism and thrombosis of unspecified deep veins of right distal lower extremity: Secondary | ICD-10-CM

## 2015-09-22 DIAGNOSIS — F329 Major depressive disorder, single episode, unspecified: Secondary | ICD-10-CM

## 2015-09-22 DIAGNOSIS — G4700 Insomnia, unspecified: Secondary | ICD-10-CM

## 2015-09-22 LAB — FUNGUS CULTURE W SMEAR: FUNGAL SMEAR: NONE SEEN

## 2015-09-22 LAB — CBC
HEMATOCRIT: 27.3 % — AB (ref 39.0–52.0)
HEMOGLOBIN: 9 g/dL — AB (ref 13.0–17.0)
MCH: 28.2 pg (ref 26.0–34.0)
MCHC: 33 g/dL (ref 30.0–36.0)
MCV: 85.6 fL (ref 78.0–100.0)
Platelets: 151 10*3/uL (ref 150–400)
RBC: 3.19 MIL/uL — ABNORMAL LOW (ref 4.22–5.81)
RDW: 16.6 % — AB (ref 11.5–15.5)
WBC: 2.8 10*3/uL — AB (ref 4.0–10.5)

## 2015-09-22 MED ORDER — APIXABAN 5 MG PO TABS: 5.0000 mg | ORAL_TABLET | Freq: Two times a day (BID) | ORAL | Status: DC

## 2015-09-22 MED ORDER — COLLAGENASE 250 UNIT/GM EX OINT: TOPICAL_OINTMENT | Freq: Every day | CUTANEOUS | Status: DC

## 2015-09-22 MED ORDER — SULFAMETHOXAZOLE-TRIMETHOPRIM 800-160 MG PO TABS: 1.0000 | ORAL_TABLET | Freq: Two times a day (BID) | ORAL | Status: DC

## 2015-09-22 MED ORDER — APIXABAN 5 MG PO TABS: 10.0000 mg | ORAL_TABLET | Freq: Two times a day (BID) | ORAL | Status: DC

## 2015-09-22 NOTE — Discharge Summary (Signed)
Physician Discharge Summary  Ethan Stewart:[id number] DOB: 03/15/1946 DOA: 09/19/2015  PCP:  Ethan Crutch, MD  Admit date: 09/19/2015 Discharge date: 09/22/2015  Time spent: 35 minutes  Recommendations for Outpatient Follow-up:  1. Follow up with PCP in 2-4 week. 2. Follow-up with oncology as an outpatient in 1-2 weeks. 3. He will go back to skilled nursing rehabilitation.  Discharge Diagnoses:  Principal Problem:   Acute deep vein thrombosis (DVT) of distal vein of right lower extremity Active Problems:   DLBCL (diffuse large B cell lymphoma)   Sepsis   Pressure ulcer   MDD (major depressive disorder), recurrent severe, without psychosis   Protein-calorie malnutrition, severe   Hypoalbuminemia   Fever   Cellulitis of right lower extremity   Discharge Condition: stable  Diet recommendation: regular  Filed Weights   09/19/15 2305  Weight: 88.4 kg (194 lb 14.2 oz)    History of present illness:  69 y.o. male with a past medical history significant for DLBCL dx'd June 2016 with extranodal mets to C-spine s/p resection and residual R arm paresis completed 2 cycles R-CHOP,until today when he developed fever and came right to the hospital. The fever is not associated with cough, shortness of breath, sputum production. He does note bilateral leg swelling and a pressure sore on his sacrum, but denies dysuria or lower urinary tract symptoms  Hospital Course:  Fever likely due to right lower extremity cellulitis: He was started empirically on IV vancomycin and Zosyn on 09/19/2015 D escalated to Bactrim on 09/22/2015 after he defervesced. He will continue Bactrim for 10 additional days.  Severe protein caloric malnutrition continue nutritional supplements.  Diffuse large B-cell lymphoma: Continue current regimen no changes were made.  Antineoplastic related anemia/pancytopenia: There is no indication for transfusion at this time, he's asymptomatic his hemoglobin has remained  stable. His platelets and hemoglobin have been trending up so this is probably related to his last chemotherapy regimen.  Major depressive disorder: No changes were made to his medication:  Constipation: Does resolve with a tap water enema, will continue MiraLAX twice a day.  Right Lower extremity DVT: Cellulitis lower extremity Doppler was done that was positive for DVT, started on IV heparin and he had several bowel movements here in the hospital that were not melanotic or bright red blood per rectum. He related he had a colonoscopy about 2-3 years ago. It was discussed with the patient and he decided to start the new agents Eluquis. He will continue this for 3-6 months.  Sacral decubitus ulcer: Wound care was consulted who recommended hydrotherapy which was performed placental. The patient has been advised to keep pressure off this. Will continue Santyl once a day. I would recommend not wearing diapers.   Procedures:  Right lower extremity Doppler   Chest x-ray   Consultations:  none  Discharge Exam: Filed Vitals:   09/22/15 0536  BP: 114/69  Pulse: 85  Temp: 98 F (36.7 C)  Resp: 18    General: A&O x3 Cardiovascular: RRR Respiratory: good air movement CTA B/L  Discharge Instructions   Discharge Instructions    Diet - low sodium heart healthy    Complete by:  As directed      Increase activity slowly    Complete by:  As directed           Current Discharge Medication List    START taking these medications   Details  !! apixaban (ELIQUIS) 5 MG TABS tablet Take 2 tablets (10 mg  total) by mouth 2 (two) times daily. Qty: 14 tablet, Refills: 0    !! apixaban (ELIQUIS) 5 MG TABS tablet Take 1 tablet (5 mg total) by mouth 2 (two) times daily. Qty: 60 tablet, Refills: 0    collagenase (SANTYL) ointment Apply topically daily. Qty: 15 g, Refills: 0    sulfamethoxazole-trimethoprim (BACTRIM DS,SEPTRA DS) 800-160 MG tablet Take 1 tablet by mouth every 12  (twelve) hours. Qty: 20 tablet, Refills: 0     !! - Potential duplicate medications found. Please discuss with Tylon Kemmerling.    CONTINUE these medications which have NOT CHANGED   Details  acetaminophen (TYLENOL) 325 MG tablet Take 2 tablets (650 mg total) by mouth every 6 (six) hours as needed for mild pain, moderate pain, fever or headache (or Fever >/= 101).    allopurinol (ZYLOPRIM) 300 MG tablet Take 300 mg by mouth daily.    Amino Acids-Protein Hydrolys (FEEDING SUPPLEMENT, PRO-STAT SUGAR FREE 64,) LIQD Take 30 mLs by mouth 2 (two) times daily with a meal.    mirtazapine (REMERON) 15 MG tablet Take 1 tablet (15 mg total) by mouth at bedtime. Might increase to 30mg  po HS in 7 days if still having insomnia Qty: 60 tablet, Refills: 1    Multiple Vitamin (MULTIVITAMIN WITH MINERALS) TABS tablet Take 1 tablet by mouth daily.    nutrition supplement, JUVEN, (JUVEN) PACK Take 1 packet by mouth 2 (two) times daily between meals. Mix in 8 oz of water or juice    omeprazole (PRILOSEC) 40 MG capsule Take 1 capsule (40 mg total) by mouth daily. Qty: 30 capsule, Refills: 0    oxandrolone (OXANDRIN) 2.5 MG tablet Take 2 tablets (5 mg total) by mouth 2 (two) times daily. Qty: 60 tablet, Refills: 0    polyethylene glycol (MIRALAX / GLYCOLAX) packet Take 17 g by mouth daily.    senna-docusate (SENNA S) 8.6-50 MG per tablet Take 2 tablets by mouth at bedtime. Qty: 60 tablet, Refills: 1    neomycin-bacitracin-polymyxin (NEOSPORIN) ointment Apply 1 application topically as needed for wound care (left foot blister).     ondansetron (ZOFRAN) 8 MG tablet Take 1 tablet (8 mg total) by mouth 2 (two) times daily. Start the day after chemo for 3 days. Then as needed for nausea or vomiting. Qty: 30 tablet, Refills: 1   Associated Diagnoses: DLBCL (diffuse large B cell lymphoma)    PRESCRIPTION MEDICATION [facility] CHEMO    prochlorperazine (COMPAZINE) 10 MG tablet Take 1 tablet (10 mg total) by mouth every 6  (six) hours as needed (Nausea or vomiting). Qty: 30 tablet, Refills: 6   Associated Diagnoses: DLBCL (diffuse large B cell lymphoma)       Allergies  Allergen Reactions  . Neulasta [Pegfilgrastim] Other (See Comments)    ARDS likely related to Neulasta   Follow-up Information    Follow up with  Ethan Crutch, MD In 2 weeks.   Specialty:  Family Medicine   Why:  follow up with PCP   Contact information:   [address] [id number] [phone]        The results of significant diagnostics from this hospitalization (including imaging, microbiology, ancillary and laboratory) are listed below for reference.    Significant Diagnostic Studies: Dg Chest 2 View  09/19/2015   CLINICAL DATA:  Pt c/o weakness, fever, sob, bilateral lower extremity swelling x several days, pt undergoing chemotherapy.  EXAM: CHEST  2 VIEW  COMPARISON:  09/01/2015  FINDINGS: Heart size is accentuated  by the AP position of patient. There is persistent patchy density at the lung bases bilaterally, consistent with atelectasis or infiltrates. No pleural effusions. Aeration has improved over recent studies. Previous cervical fusion.  IMPRESSION: Persistent bilateral lower lobe atelectasis or infiltrates.   Electronically Signed   By: Nolon Nations M.D.   On: 09/19/2015 19:57   Dg Cervical Spine 1 View  09/04/2015   CLINICAL DATA:  Postoperative, surgical complication, subsequent encounter.  EXAM: DG CERVICAL SPINE - 1 VIEW  COMPARISON:  None.  FINDINGS: Patient status post anterior fusion of C4, C5 and C6 without malalignment. The prevertebral soft tissues are normal.  IMPRESSION: Status post a anterior fusion of cervical spine without malalignment.   Electronically Signed   By: Abelardo Diesel M.D.   On: 09/04/2015 10:56   Dg Chest Port 1 View  09/01/2015   CLINICAL DATA:  ARDS.  EXAM: PORTABLE CHEST - 1 VIEW  COMPARISON:  08/30/2015.  FINDINGS: Left PICC line in stable position . Cardiomegaly. Low lung  volumes with stable bibasilar atelectasis and/or infiltrates. No pleural effusion or pneumothorax. Right supraclavicular subcutaneous emphysema is stable.  IMPRESSION: 1. Left PICC line stable position. 2. Low lung volumes with stable bibasilar subsegmental atelectasis and/or infiltrates. 3. Stable cardiomegaly. 4. Right supraclavicular subcutaneous emphysema unchanged. No pneumothorax.   Electronically Signed   By: Marcello Moores  Register   On: 09/01/2015 07:02   Dg Chest Port 1 View  08/30/2015   CLINICAL DATA:  Respiratory failure/hypoxia  EXAM: PORTABLE CHEST - 1 VIEW  COMPARISON:  August 29, 2015  FINDINGS: Endotracheal tube and nasogastric tube have been removed. Central catheter tip is in the superior vena cava near the cavoatrial junction, stable. There is no demonstrable pneumothorax. There is, however, subcutaneous air, primarily on the right, stable. There is patchy airspace disease in both lower lobes, slightly more on the left than on the right, stable. No new opacity. Heart size and pulmonary vascularity are normal. No adenopathy. No bone lesions.  IMPRESSION: There is subcutaneous air, primarily on the right, but no pneumothorax appreciable. Patchy airspace disease in the bases is stable. No new opacity. No change in cardiac silhouette.   Electronically Signed   By: Lowella Grip III M.D.   On: 08/30/2015 07:03   Dg Chest Port 1 View  08/29/2015   CLINICAL DATA:  ARDS.  EXAM: PORTABLE CHEST - 1 VIEW  COMPARISON:  08/28/2015.  FINDINGS: Endotracheal tube, NG tube, left PICC line in stable position. Heart size stable. Diffuse bilateral airspace disease with basilar atelectasis noted. No pleural effusion pneumothorax. Right chest wall subcutaneous emphysema again noted .  IMPRESSION: 1. Lines and tubes in stable position. 2. Diffuse bilateral airspace disease with bibasilar atelectasis. 3. Right chest wall subcutaneous emphysema again noted. No pneumothorax.   Electronically Signed   By: Marcello Moores   Register   On: 08/29/2015 07:10   Dg Chest Port 1 View  08/28/2015   CLINICAL DATA:  69 year old male with increasing oxygen requirements and known subcutaneous emphysema. Evaluate for pneumothorax.  EXAM: PORTABLE CHEST - 1 VIEW  COMPARISON:  Prior chest x-ray earlier today 08/28/2015 1:39 a.m.  FINDINGS: Tracheostomy tube is 4.8 cm above the carina. Left upper extremity PICC remains in good position with the tip overlying the superior cavoatrial junction. A nasogastric tube is present. The tip overlies the upper stomach in unchanged position.  Persistent mediastinal air with increasing subcutaneous emphysema in the soft tissues of the right neck and shoulder. Additionally, there is progressive opacification  of the right lung base with developing air bronchograms. There is associated volume loss. This is favored to represent atelectasis. Aeration in the left lung base has improved compared to the prior imaging. No acute osseous abnormality.  IMPRESSION: 1. Progressive opacification and volume loss in the right lower lobe concerning for increasing atelectasis versus developing infiltrate. Given the rapid interval change, atelectasis is strongly favored. 2. No evidence of pneumothorax although there is increasing subcutaneous emphysema in the soft tissues of the right neck, shoulder and lateral chest. 3. Improved aeration in the left lung base likely reflecting resolving left basilar atelectasis. 4. Stable and satisfactory support apparatus.   Electronically Signed   By: Jacqulynn Cadet M.D.   On: 08/28/2015 09:58   Dg Chest Port 1 View  08/28/2015   CLINICAL DATA:  ARDS.  History of lymphoma.  Worsening hypoxia.  EXAM: PORTABLE CHEST - 1 VIEW  COMPARISON:  08/27/2015  FINDINGS: Endotracheal tube with tip measuring 7.2 cm above the carinal. Enteric tube tip is in the upper stomach just below the EG junction. Left PICC catheter with tip over the cavoatrial junction region. Shallow inspiration. Patchy linear  infiltrates in the mid lungs and right lower lung appear generally stable since previous study. No blunting of costophrenic angles. Subcutaneous emphysema in the right neck and anterior chest wall without definite change. Pneumomediastinum on the right. No definite pneumothorax.  IMPRESSION: Appliances are unchanged in position. Patchy airspace infiltrates in the lungs without change. Subcutaneous emphysema in the right neck and upper chest with pneumo mediastinum. No significant change.   Electronically Signed   By: Lucienne Capers M.D.   On: 08/28/2015 05:20   Dg Chest Port 1 View  08/27/2015   CLINICAL DATA:  ARDS  EXAM: PORTABLE CHEST - 1 VIEW  COMPARISON:  08/26/2015 and prior radiographs  FINDINGS: Cardiomediastinal silhouette is unchanged.  Bilateral interstitial and airspace opacities are unchanged.  An endotracheal tube with tip 7.5 cm above the carina and left-sided PICC line with tip overlying the superior cavoatrial junction again noted.  Right subcutaneous emphysema and pneumomediastinum again noted.  There is no evidence of pneumothorax.  IMPRESSION: No significant change of interstitial and bilateral airspace opacities, pneumomediastinum and right subcutaneous emphysema. No evidence of pneumothorax.   Electronically Signed   By: Margarette Canada M.D.   On: 08/27/2015 09:25   Dg Chest Port 1 View  08/26/2015   CLINICAL DATA:  Acute respiratory failure.  EXAM: PORTABLE CHEST - 1 VIEW  COMPARISON:  08/25/2015, 08/24/2015, 08/22/2015, 08/19/2015  FINDINGS: Bilateral perihilar interstitial and alveolar airspace opacities. Left basilar airspace disease. No pleural effusion or pneumothorax. Stable cardiomediastinal silhouette. Nasogastric tube coursing below the diaphragm with the tip excluded from the field of view. No acute osseous abnormality. Soft tissue emphysema at the base of the right side of the neck. No acute osseous abnormality.  IMPRESSION: 1. No significant interval change in bilateral perihilar  interstitial and alveolar airspace opacities and left basilar airspace disease. Differential considerations include pulmonary edema versus multifocal pneumonia. 2. Soft tissue emphysema at the base of the right side of the neck. This is similar in appearance to the prior exam but new compared with 08/22/2015. Correlate with any interval instrumentation.   Electronically Signed   By: Kathreen Devoid   On: 08/26/2015 09:10   Dg Chest Port 1 View  08/25/2015   CLINICAL DATA:  Respiratory failure intubated patient  EXAM: PORTABLE CHEST - 1 VIEW  COMPARISON:  Portable chest x-ray of August 24, 2015  FINDINGS: The lungs are well-expanded. Confluent interstitial and alveolar opacities persist but are slightly less conspicuous today. The retrocardiac region remains dense with partial obscuration of the hemidiaphragm. The cardiac silhouette remains enlarged. The pulmonary vascularity is more distinct but still engorged.  The endotracheal tube tip lies 8.2 cm above the carina. The esophagogastric tube tip projects below the inferior margin of the image. The left PICC line tip projects in the mid SVC.  IMPRESSION: Slight interval improvement of bilateral interstitial and alveolar opacities which suggests improving pulmonary edema/ARDS and/or pneumonia.   Electronically Signed   By: David  Martinique M.D.   On: 08/25/2015 07:28   Dg Chest Port 1 View  08/24/2015   CLINICAL DATA:  Respiratory failure, sepsis, ARDS.  EXAM: PORTABLE CHEST - 1 VIEW  COMPARISON:  Portable chest x-ray of August 23, 2015.  FINDINGS: The lungs are adequately inflated. There are fluffy alveolar opacities bilaterally. The retrocardiac region on the left is more dense today with obscuration of the hemidiaphragm. The cardiac silhouette is mildly enlarged. The pulmonary vascularity is indistinct. There is no pneumothorax. The endotracheal tube tip lies 3.7 cm above the crotch of the carina. The esophagogastric tube tip projects below the inferior margin of  the image.  IMPRESSION: Slight interval worsening in the appearance of the pulmonary interstitium bilaterally with increasing atelectasis or infiltrate in the left lower lobe. The pattern is consistent with ARDS. Stable mild pulmonary vascular congestion.   Electronically Signed   By: David  Martinique M.D.   On: 08/24/2015 07:35   Dg Chest Port 1v Same Day  08/28/2015   CLINICAL DATA:  Status post bronchoscopy. Evaluate for pneumothorax.  EXAM: PORTABLE CHEST - 1 VIEW SAME DAY  COMPARISON:  08/28/2015 and prior exams  FINDINGS: An endotracheal tube with tip 3.5 cm above the carina, NG tube with tip overlying the proximal stomach and left PICC line with tip overlying the lower SVC again noted.  Right lung airspace disease again noted with peak right lower lung airspace disease/atelectasis.  Mild left basilar atelectasis/ airspace disease again noted.  There is no evidence of pneumothorax.  Right subcutaneous emphysema is again identified.  IMPRESSION: No evidence of pneumothorax status post bronchoscopy.  Decreased right lower lung atelectasis/ airspace disease. No other significant change.   Electronically Signed   By: Margarette Canada M.D.   On: 08/28/2015 11:26   Dg Abd Portable 1v  09/01/2015   CLINICAL DATA:  Feeding tube placement  EXAM: PORTABLE ABDOMEN - 1 VIEW  COMPARISON:  08/22/2015  FINDINGS: Dobbhoff feeding tube is seen projecting over the stomach along the greater curvature with tip in the region of the antrum/pylorus.  IMPRESSION: Dobbhoff feeding tube as described   Electronically Signed   By: Skipper Cliche M.D.   On: 09/01/2015 12:21   Dg Swallowing Func-speech Pathology  09/04/2015    Objective Swallowing Evaluation:    Patient Details  Name: ARNOLDO HILDRETH MRN: [id number] Date of Birth: 02-10-1946  Today's Date: 09/04/2015 Time: SLP Start Time (ACUTE ONLY): 0835-SLP Stop Time (ACUTE ONLY): 0906 SLP Time Calculation (min) (ACUTE ONLY): 31 min  Past Medical History:  Past Medical History  Diagnosis  Date  . History of bleeding ulcers 1990's  . 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 Anxiety   . GERD (gastroesophageal reflux disease)   . H. pylori infection   . History of blood transfusion   . Bone cancer      tumor on C5  . Arm  numbness 08/07/15    Limited movement due to tumor on spine  . Diffuse large B cell lymphoma     biposy 07/25/2105   Past Surgical History:  Past Surgical History  Procedure Laterality Date  . Hernia repair Bilateral     with mesh  . Colonoscopy    . Eye surgery Right     catheter  . Anterior cervical corpectomy N/A 07/26/2015    Procedure: Cervical five Corpectomy/Cervical four-six Fusion/Plate ;   Surgeon: Eustace Moore, MD;  Location: [facility];  Service:  Neurosurgery;  Laterality: N/A;  C5 Corpectomy/C4-6 Fusion/Plate C4-6   HPI:  Other Pertinent Information: 69 y.o. male with a history of Diffuse Large  B-Cell Lymphoma diagnosed 07/2015 on R-CHOP Chemo Rx, GERD, bone cancer,  cervical spine tumor, bleeding ulcers admitted with complaints of fevers  and chills and cough with SOB x 2 days. Pt underwent s/p C5 corpectomy  with C4-C6 arthrodesis and anterior cervical plating of C4-C6 8/13.  Intubated 8/30-9/6. CXR 9/5 progressive opacification and volume loss in  the right lower lobe concerning for increasing atelectasis versus  developing infiltrate and repeat CXR 9/7 there is subcutaneous air,  primarily on the right, but no pneumothorax appreciable. Patchy airspace  disease in the bases is stable. No new opacity. No change in cardiac  silhouette. RN reported coughing after ice chips and BSE obtained  yesterday with pt being placed on modified diet.   Pt seen to inform  his/wife of this SLP recommendation for repeat MBS today as he received  tube feeding over the weekend.  Multiple risk factors for silent nature of  dysphagia present.    No Data Recorded  Assessment / Plan / Recommendation CH[facility] IP CLINICAL IMPRESSIONS 09/04/2015  Therapy Diagnosis Moderate pharyngeal phase dysphagia   Clinical Impression Mild pharyngeal dysphagia with ongoing pharyngeal with  improved clearance since prior MBS last Thursday 08/31/15.  Moderate  pharyngeal residuals continues (vallecular more than pyriform sinus) -  worse with solids than liquids - due to decreased tongue base retraction  epiglottic deflection, and laryngeal elevation.  Pt conducting dry  swallows, cough, expectoration during evaluation to compensate and aid  clearance with verbal cue.  He continues to be sensory impaired to  residuals unfortunately.  Trace aspiration of thin via straw noted without  pt sensation.  SLP ?s if dobhoff impacted pharyngeal clearance further due  to narrowed pharynx and epiglottis contacting tube.    Pt has been conducting swallowing exercises consistently over the weekend  and has demonstrated improved phonatory and swallow ability.  Recommend  consider to dc Dobhoff and initiate diet with strict precautions.  Folllow  up SLP indicated for dysphagia management to furhter strengthen pharyngeal  swallow musculature.  Using live monitor SLP educated pt/spouse to  findings and reinforced effective compensation strategies.  Will continue  to follow for management.  MD paged at [facility] with recommendations.         [facility] IP TREATMENT RECOMMENDATION 09/04/2015  Treatment Recommendations Therapy as outlined in treatment plan below     [facility] IP DIET RECOMMENDATION 09/04/2015  SLP Diet Recommendations Dysphagia 3 (Mech soft);Thin  Liquid Administration via Thin, prefer water, no straws  Medication Administration Crushed with puree  Compensations Slow rate;Small sips/bites;Multiple dry swallows after each  bite/sip;Follow solids with liquid;Hard cough after swallow;Effortful  swallow, start intake with water  Postural Changes and/or Swallow Maneuvers Upright 90* and for 30 min after  meals     CH[facility] IP OTHER RECOMMENDATIONS  09/04/2015  Recommended Consults (None)  Oral Care Recommendations Oral care BID  Other Recommendations Have oral  suction available     CH[facility] IP FOLLOW UP RECOMMENDATIONS 09/01/2015  Follow up Recommendations SNF     [facility] IP FREQUENCY AND DURATION 09/04/2015  Speech Therapy Frequency (ACUTE ONLY) min 2x/week  Treatment Duration 1 week     Pertinent Vitals/Pain     SLP Swallow Goals No flowsheet data found.  No flowsheet data found.    CHL IP REASON FOR REFERRAL 09/04/2015  Reason for Referral Objectively evaluate swallowing function     CH[facility] IP ORAL PHASE 09/04/2015  Lips (None)  Tongue (None)  Mucous membranes (None)  Nutritional status (None)  Other (None)  Oxygen therapy (None)  Oral Phase WFL  Oral - Pudding Teaspoon (None)  Oral - Pudding Cup (None)  Oral - Honey Teaspoon (None)  Oral - Honey Cup (None)  Oral - Honey Syringe (None)  Oral - Nectar Teaspoon (None)  Oral - Nectar Cup (None)  Oral - Nectar Straw (None)  Oral - Nectar Syringe (None)  Oral - Ice Chips (None)  Oral - Thin Teaspoon (None)  Oral - Thin Cup (None)  Oral - Thin Straw (None)  Oral - Thin Syringe (None)  Oral - Puree (None)  Oral - Mechanical Soft (None)  Oral - Regular (None)  Oral - Multi-consistency (None)  Oral - Pill (None)  Oral Phase - Comment (None)      CHL IP PHARYNGEAL PHASE 09/04/2015  Pharyngeal Phase (None)  Pharyngeal - Pudding Teaspoon (None)  Penetration/Aspiration details (pudding teaspoon) (None)  Pharyngeal - Pudding Cup (None)  Penetration/Aspiration details (pudding cup) (None)  Pharyngeal - Honey Teaspoon (None)  Penetration/Aspiration details (honey teaspoon) (None)  Pharyngeal - Honey Cup (None)  Penetration/Aspiration details (honey cup) (None)  Pharyngeal - Honey Syringe (None)  Penetration/Aspiration details (honey syringe) (None)  Pharyngeal - Nectar Teaspoon (None)  Penetration/Aspiration details (nectar teaspoon) (None)  Pharyngeal - Nectar Cup (None)  Penetration/Aspiration details (nectar cup) (None)  Pharyngeal - Nectar Straw (None)  Penetration/Aspiration details (nectar straw) (None)  Pharyngeal - Nectar Syringe (None)   Penetration/Aspiration details (nectar syringe) (None)  Pharyngeal - Ice Chips (None)  Penetration/Aspiration details (ice chips) (None)  Pharyngeal - Thin Teaspoon (None)  Penetration/Aspiration details (thin teaspoon) (None)  Pharyngeal - Thin Cup (None)  Penetration/Aspiration details (thin cup) (None)  Pharyngeal - Thin Straw (None)  Penetration/Aspiration details (thin straw) (None)  Pharyngeal - Thin Syringe (None)  Penetration/Aspiration details (thin syringe') (None)  Pharyngeal - Puree (None)  Penetration/Aspiration details (puree) (None)  Pharyngeal - Mechanical Soft (None)  Penetration/Aspiration details (mechanical soft) (None)  Pharyngeal - Regular (None)  Penetration/Aspiration details (regular) (None)  Pharyngeal - Multi-consistency (None)  Penetration/Aspiration details (multi-consistency) (None)  Pharyngeal - Pill (None)  Penetration/Aspiration details (pill) (None)  Pharyngeal Comment multiple swallows, following solids with liquids,  cough/throat clear/expectoration helpful      CHL IP CERVICAL ESOPHAGEAL PHASE 09/04/2015  Cervical Esophageal Phase (None)  Pudding Teaspoon (None)  Pudding Cup (None)  Honey Teaspoon (None)  Honey Cup (None)  Honey Straw (None)  Nectar Teaspoon (None)  Nectar Cup (None)  Nectar Straw (None)  Nectar Sippy Cup (None)  Thin Teaspoon (None)  Thin Cup (None)  Thin Straw (None)  Thin Sippy Cup (None)  Cervical Esophageal Comment mildly decreased clearance of liquids into  upper esophagus, improved compared to prior eval    No flowsheet data found.         Luanna Salk,  MS [facility] SLP [phone]    Dg Swallowing Func-speech Pathology  08/31/2015    Objective Swallowing Evaluation:    Patient Details  Name: SAHIR TOLSON MRN: [id number] Date of Birth: 1946-03-18  Today's Date: 08/31/2015 Time: SLP Start Time (ACUTE ONLY): 1402-SLP Stop Time (ACUTE ONLY): 1426 SLP Time Calculation (min) (ACUTE ONLY): 24 min  Past Medical History:  Past Medical History  Diagnosis Date  . History  of bleeding ulcers 1990's  . 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 Anxiety   . GERD (gastroesophageal reflux disease)   . H. pylori infection   . History of blood transfusion   . Bone cancer      tumor on C5  . Arm numbness 08/07/15    Limited movement due to tumor on spine  . Diffuse large B cell lymphoma     biposy 07/25/2105   Past Surgical History:  Past Surgical History  Procedure Laterality Date  . Hernia repair Bilateral     with mesh  . Colonoscopy    . Eye surgery Right     catheter  . Anterior cervical corpectomy N/A 07/26/2015    Procedure: Cervical five Corpectomy/Cervical four-six Fusion/Plate ;   Surgeon: Eustace Moore, MD;  Location: [place] [facility];  Service:  Neurosurgery;  Laterality: N/A;  C5 Corpectomy/C4-6 Fusion/Plate C4-6   HPI:  Other Pertinent Information: 69 y.o. male with a history of Diffuse Large  B-Cell Lymphoma diagnosed 07/2015 on [facility] Chemo Rx, GERD, bone cancer,  cervical spine tumor, bleeding ulcers admitted with complaints of fevers  and chills and cough with SOB x 2 days. Pt underwent s/p C5 corpectomy  with C4-C6 arthrodesis and anterior cervical plating of C4-C6 8/13.  Intubated 8/30-9/6. CXR 9/5 progressive opacification and volume loss in  the right lower lobe concerning for increasing atelectasis versus  developing infiltrate and repeat CXR 9/7 there is subcutaneous air,  primarily on the right, but no pneumothorax appreciable. Patchy airspace  disease in the bases is stable. No new opacity. No change in cardiac  silhouette. RN reported coughing after ice chips and BSE obtained  yesterday with pt being placed on modified diet.  Today pt seen to inform  his/wife of this SLP recommendation for MBS due to mulitple risk factors  for silent nature of dysphagia.    No Data Recorded  Assessment / Plan / Recommendation CHL IP CLINICAL IMPRESSIONS 08/31/2015  Therapy Diagnosis Moderate oral phase dysphagia;Moderate pharyngeal phase  dysphagia  Clinical Impression Moderate sensorimotor  oropharyngeal dysphagia Pt with  moderate sensorimotor oropharyngeal dysphagia with trace aspiration of  thin liquid below vocal cords = with subtle throat clearing. Largest  issue is weakness resulting in pharyngeal residuals *vallecular more than  pyriform* across all consistencies without awareness/sensation. Thicker  viscosity resulted in increased residuals. Dry nor liquid swallows  cleared residuals and pt unable to perform "expectoration" hock to clear.  Pt will be an aspiration risk regardless of consistency due to residuals  that he does not sense nor is able to clear. But he may be less of a risk  with liquids due to fewer residuals.Using live monitor, educated pt to  findings.   At end of study, patient stated he has no problems swallowing - which is  different information than he provided earlier.Encouraged pt to  strengthen his "hocking" ability to help remove vallecular stasis.  Uncertain to level swallowing will improve given pt's medical diagnosis  and prior patient report of hoarseness/speech/swallow deficits for 2 weeks  prior  to admit. Options: Full liquids with accepted risks   Multiple  swallows with each bolus.  Intermittent throat clear (especially if vocal  quality is wet)  Expectoration throughout intake as able.  Stop intake if  pt coughing  OR NPO except ice chips and tsps water.  Consider repeat MBS  with improved medical status.         CHL IP TREATMENT RECOMMENDATION 08/31/2015  Treatment Recommendations Therapy as outlined in treatment plan below     CHL IP DIET RECOMMENDATION 08/31/2015  SLP Diet Recommendations NPO;Ice chips PRN after oral care or full liquid  with accepted aspiration risk  Liquid Administration via Cup, straw  Medication Administration Crushed with puree  Compensations Slow rate;Small sips/bites;Multiple dry swallows after each  bite/sip;Clear throat intermittently, cough if voice wet/gurgly  Postural Changes and/or Swallow Maneuvers Stay upright after  meals     CHL IP OTHER RECOMMENDATIONS 08/31/2015  Recommended Consults (None)  Oral Care Recommendations Oral care QID  Other Recommendations (None)     No flowsheet data found.   CHL IP FREQUENCY AND DURATION 08/31/2015  Speech Therapy Frequency (ACUTE ONLY) min 1 x/week  Treatment Duration 1 week         CHL IP REASON FOR REFERRAL 08/31/2015  Reason for Referral Objectively evaluate swallowing function     CHL IP ORAL PHASE 08/31/2015  Lips (None)  Tongue (None)  Mucous membranes (None)  Nutritional status (None)  Other (None)  Oxygen therapy (None)  Oral Phase Impaired  Oral - Pudding Teaspoon (None)  Oral - Pudding Cup (None)  Oral - Honey Teaspoon (None)  Oral - Honey Cup (None)  Oral - Honey Syringe (None)  Oral - Nectar Teaspoon (None)  Oral - Nectar Cup (None)  Oral - Nectar Straw (None)  Oral - Nectar Syringe (None)  Oral - Ice Chips (None)  Oral - Thin Teaspoon (None) Oral - Thin Cup (None)  Oral - Thin Straw (None)  Oral - Thin Syringe (None)  Oral - Puree (None)  Oral - Mechanical Soft (None)  Oral - Regular (None)  Oral - Multi-consistency (None)  Oral - Pill (None)  Oral Phase - Comment (None)      CHL IP PHARYNGEAL PHASE 08/31/2015  Pharyngeal Phase Impaired  Pharyngeal - Pudding Teaspoon (None)  Penetration/Aspiration details (pudding teaspoon) (None)  Pharyngeal - Pudding Cup (None)  Penetration/Aspiration details (pudding cup) (None)  Pharyngeal - Honey Teaspoon (None)  Penetration/Aspiration details (honey teaspoon) (None)  Pharyngeal - Honey Cup (None)  Penetration/Aspiration details (honey cup) (None)  Pharyngeal - Honey Syringe (None)  Penetration/Aspiration details (honey syringe) (None)  Pharyngeal - Nectar Teaspoon (None)  Penetration/Aspiration details (nectar teaspoon) (None)  Pharyngeal - Nectar Cup (None)  Penetration/Aspiration details (nectar cup) (None)  Pharyngeal - Nectar Straw (None)  Penetration/Aspiration details (nectar straw) (None)  Pharyngeal - Nectar Syringe (None)   Penetration/Aspiration details (nectar syringe) (None)  Pharyngeal - Ice Chips (None)  Penetration/Aspiration details (ice chips) (None)  Pharyngeal - Thin Teaspoon (None)  Penetration/Aspiration details (thin teaspoon) (None)  Pharyngeal - Thin Cup (None)  Penetration/Aspiration details (thin cup) (None)  Pharyngeal - Thin Straw (None)  Penetration/Aspiration details (thin straw) (None)  Pharyngeal - Thin Syringe (None)  Penetration/Aspiration details (thin syringe') (None)  Pharyngeal - Puree (None)  Penetration/Aspiration details (puree) (None)  Pharyngeal - Mechanical Soft (None)  Penetration/Aspiration details (mechanical soft) (None)  Pharyngeal - Regular (None)  Penetration/Aspiration details (regular) (None)  Pharyngeal - Multi-consistency (None)  Penetration/Aspiration details (multi-consistency) (None)  Pharyngeal -  Pill (None)  Penetration/Aspiration details (pill) (None)  Pharyngeal Comment pt WITHOUT sensation to residuals nor did he clear them  with CUED dry swallows due to gross weakness, pt did not clear residuals  with cued "hocking" either although he made a good effort      CHL IP CERVICAL ESOPHAGEAL PHASE 08/31/2015  Cervical Esophageal Phase Impaired  Pudding Teaspoon (None)  Pudding Cup (None)  Honey Teaspoon (None)  Honey Cup (None)  Honey Straw (None)  Nectar Teaspoon (None)  Nectar Cup (None)  Nectar Straw (None)  Nectar Sippy Cup (None)  Thin Teaspoon (None)  Thin Cup (None)  Thin Straw (None)  Thin Sippy Cup (None)  Cervical Esophageal Comment decreased clearance into upper  esophagus-pyriform sinus residuals             Luanna Salk, MS [facility] SLP [phone]    US Abdomen Limited Ruq  09/03/2015   CLINICAL DATA:  69 year old male with a history of transaminitis.  EXAM: US ABDOMEN LIMITED - RIGHT UPPER QUADRANT  COMPARISON:  None.  FINDINGS: Gallbladder:  Fundus of the gallbladder not visualized secondary to bowel gas. Negative sonographic Murphy sign. Visualized gallbladder wall not  thickened. Uniformly anechoic fluid within the gallbladder with no stones or sludge identified. No pericholecystic fluid.  Common bile duct:  Diameter: 3 mm  Liver:  No focal lesion identified. Within normal limits in parenchymal echogenicity.  IMPRESSION: Sonographic survey negative for acute cholecystitis.  Signed,  Dulcy Fanny. Earleen Newport, DO  [facility]  [facility]   Electronically Signed   By: Corrie Mckusick D.O.   On: 09/03/2015 11:06    Microbiology: Recent Results (from the past 240 hour(s))  Culture, blood (routine x 2)     Status: None (Preliminary result)   Collection Time: 09/19/15  7:19 PM  Result Value Ref Range Status   Specimen Description BLOOD RIGHT HAND  Final   Special Requests BOTTLES DRAWN AEROBIC AND ANAEROBIC 5CC  Final   Culture   Final    NO GROWTH 2 DAYS Performed at [facility]    Report Status PENDING  Incomplete  Culture, blood (routine x 2)     Status: None (Preliminary result)   Collection Time: 09/19/15  7:26 PM  Result Value Ref Range Status   Specimen Description BLOOD LEFT HAND  Final   Special Requests BOTTLES DRAWN AEROBIC AND ANAEROBIC 5CC  Final   Culture   Final    NO GROWTH 2 DAYS Performed at [facility]    Report Status PENDING  Incomplete  Urine culture     Status: None   Collection Time: 09/19/15  7:55 PM  Result Value Ref Range Status   Specimen Description URINE, CLEAN CATCH  Final   Special Requests NONE  Final   Culture   Final    NO GROWTH 1 DAY Performed at [facility]    Report Status 09/20/2015 FINAL  Final     Labs: Basic Metabolic Panel:  Recent Labs Lab 09/18/15 1051 09/19/15 1926 09/20/15 0548  NA 140 136 138  K 4.2 4.1 3.4*  CL  --  104 106  CO2 27 23 25   GLUCOSE 107 98 105*  BUN 9.4 12 9   CREATININE 0.7 0.70 0.61  CALCIUM 9.1 8.4* 8.1*   Liver Function Tests:  Recent Labs Lab 09/18/15 1051 09/19/15 1926  AST 15 26  ALT 24 24  ALKPHOS  107 76  BILITOT 0.61 0.8  PROT 6.0* 5.7*  ALBUMIN 2.7* 2.8*   No results for input(s): LIPASE, AMYLASE in the last 168 hours. No results for input(s): AMMONIA in the last 168 hours. CBC:  Recent Labs Lab 09/18/15 1051 09/19/15 1926 09/20/15 0548 09/21/15 0440 09/22/15 0515  WBC 7.2 4.6 3.1* 3.0* 2.8*  NEUTROABS 6.5 3.9  --   --   --   HGB 10.1* 8.7* 7.5* 8.8* 9.0*  HCT 30.9* 26.2* 22.6* 26.5* 27.3*  MCV 87.0 86.8 85.6 84.4 85.6  PLT 170 143* 122* 118* 151   Cardiac Enzymes: No results for input(s): CKTOTAL, CKMB, CKMBINDEX, TROPONINI in the last 168 hours. BNP: BNP (last 3 results)  Recent Labs  08/21/15 1725 09/20/15 0008  BNP 86.7 33.9    ProBNP (last 3 results) No results for input(s): PROBNP in the last 8760 hours.  CBG: No results for input(s): GLUCAP in the last 168 hours.     Signed:  Charlynne Cousins  [facility] 09/22/2015, 9:24 AM

## 2015-09-22 NOTE — Progress Notes (Signed)
Pt d/c to [facility] today. Pt / spouse were in agreement with d/c plan. PT approved transport by car. [employer] adv medicare approved SNF placement. NSG reviewed d/c summary, scripts, avs. Scripts included in d/c packet. D/C summary sent to SNF for review prior to d/c. D/C packet provided to pt prior to d/c.  Werner Lean LCSW [phone]

## 2015-09-22 NOTE — Discharge Instructions (Signed)
Information on my medicine - ELIQUIS (apixaban)  This medication education was reviewed with me or my healthcare representative as part of my discharge preparation.  The pharmacist that spoke with me during my hospital stay was:  Avir Deruiter A, RPH  Why was Eliquis prescribed for you? Eliquis was prescribed to treat blood clots that may have been found in the veins of your legs (deep vein thrombosis) or in your lungs (pulmonary embolism) and to reduce the risk of them occurring again.  What do You need to know about Eliquis ? The starting dose is 10 mg (two 5 mg tablets) taken TWICE daily for the FIRST SEVEN (7) DAYS, then on (enter date)  09/29/2015  the dose is reduced to ONE 5 mg tablet taken TWICE daily.  Eliquis may be taken with or without food.   Try to take the dose about the same time in the morning and in the evening. If you have difficulty swallowing the tablet whole please discuss with your pharmacist how to take the medication safely.  Take Eliquis exactly as prescribed and DO NOT stop taking Eliquis without talking to the doctor who prescribed the medication.  Stopping may increase your risk of developing a new blood clot.  Refill your prescription before you run out.  After discharge, you should have regular check-up appointments with your healthcare provider that is prescribing your Eliquis.    What do you do if you miss a dose? If a dose of ELIQUIS is not taken at the scheduled time, take it as soon as possible on the same day and twice-daily administration should be resumed. The dose should not be doubled to make up for a missed dose.  Important Safety Information A possible side effect of Eliquis is bleeding. You should call your healthcare provider right away if you experience any of the following: ? Bleeding from an injury or your nose that does not stop. ? Unusual colored urine (red or dark brown) or unusual colored stools (red or black). ? Unusual bruising  for unknown reasons. ? A serious fall or if you hit your head (even if there is no bleeding).  Some medicines may interact with Eliquis and might increase your risk of bleeding or clotting while on Eliquis. To help avoid this, consult your healthcare provider or pharmacist prior to using any new prescription or non-prescription medications, including herbals, vitamins, non-steroidal anti-inflammatory drugs (NSAIDs) and supplements.  This website has more information on Eliquis (apixaban): http://www.eliquis.com/eliquis/home

## 2015-09-22 NOTE — Progress Notes (Signed)
HEMATOLOGY/ONCOLOGY INPATIENT PROGRESS NOTE  Date of Service: 09/22/2015  Inpatient Attending: .Charlynne Cousins, MD   SUBJECTIVE  I saw Ethan Stewart last evening and again this afternoon for follow-up regarding his admission for right lower extremity cellulitis and DVT. He is been treated with antibiotics notes that his leg is feeling much better. No fevers or chills. Has a port placement scheduled for Monday which she would like to move later in the week which is appropriate given his infection. I did talk to my nurse to reschedule him preferably on Friday of next week if possible. Notes that his sacral wound is slowly improving. Did see wound care in the hospital and had some hydroblasting  of his wound. Has been gradually and progressively moving his mobility and strength. Appears to be in better spirits . Looking forward to getting the repeat PET/CT scan next Friday to reevaluate the status of his lymphoma.   Wife Ethan Stewart at bedside.  All of her questions and concerns were answered in detail. Encouraged him to eat as well as possible.  OBJECTIVE:  NAD  PHYSICAL EXAMINATION: . Filed Vitals:   09/21/15 1000 09/21/15 1436 09/21/15 2106 09/22/15 0536  BP: 126/60 122/65 116/60 114/69  Pulse: 90 87 82 85  Temp: 98.8 F (37.1 C) 98.5 F (36.9 C) 98.4 F (36.9 C) 98 F (36.7 C)  TempSrc: Oral Oral Oral Oral  Resp: '22 20 20 18  ' Height:      Weight:      SpO2: 99% 99% 98% 96%   Filed Weights   09/19/15 2305  Weight: 194 lb 14.2 oz (88.4 kg)   .Body mass index is 25.72 kg/(m^2).  GENERAL:alert, in no acute distress and comfortable SKIN - resolving redness from cellulitis over his right lower extremity. Sacral decubitus ulcer not examined today. EYES: normal, conjunctiva are pink and non-injected, sclera clear OROPHARYNX:no exudate, no erythema and lips, buccal mucosa, and tongue normal  NECK: supple, no JVD, thyroid normal size, non-tender, without nodularity LYMPH:  no  palpable lymphadenopathy in the cervical, axillary or inguinal LUNGS: clear to auscultation with normal respiratory effort HEART: regular rate & rhythm,  no murmurs  ABDOMEN: abdomen soft, non-tender, normoactive bowel sounds  Musculoskeletal: Mild 1+ lower extremity edema,  resolving cellulitis right lower extremity  PSYCH: alert & oriented x 3 with fluent speech NEURO: no focal motor/sensory deficits  MEDICAL HISTORY:  Past Medical History  Diagnosis Date  . History of bleeding ulcers 1990's  . 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 Anxiety   . GERD (gastroesophageal reflux disease)   . H. pylori infection   . History of blood transfusion   . Bone cancer      tumor on C5  . Arm numbness 08/07/15    Limited movement due to tumor on spine  . Diffuse large B cell lymphoma     biposy 07/25/2105    SURGICAL HISTORY: Past Surgical History  Procedure Laterality Date  . Hernia repair Bilateral     with mesh  . Colonoscopy    . Eye surgery Right     catheter  . Anterior cervical corpectomy N/A 07/26/2015    Procedure: Cervical five Corpectomy/Cervical four-six Fusion/Plate ;  Surgeon: Eustace Moore, MD;  Location: [facility];  Service: Neurosurgery;  Laterality: N/A;  C5 Corpectomy/C4-6 Fusion/Plate C4-6    SOCIAL HISTORY: Social History   Social History  . Marital Status: Married    Spouse Name: N/A  . Number of Children: N/A  .  Years of Education: N/A   Occupational History  . Not on file.   Social History Main Topics  . Smoking status: Former Smoker -- 1.00 packs/day for 10 years    Types: Cigarettes    Quit date: 12/23/1981  . Smokeless tobacco: Never Used  . Alcohol Use: No  . Drug Use: No  . Sexual Activity: Yes   Other Topics Concern  . Not on file   Social History Narrative    FAMILY HISTORY: Family History  Problem Relation Age of Onset  . Hypertension Mother     ALLERGIES:  is allergic to neulasta.  MEDICATIONS:  Scheduled Meds: . allopurinol   300 mg Oral Daily  . apixaban  10 mg Oral BID  . collagenase   Topical Daily  . feeding supplement (PRO-STAT SUGAR FREE 64)  30 mL Oral BID WC  . mirtazapine  15 mg Oral QHS  . nutrition supplement (JUVEN)  1 packet Oral BID BM  . oxandrolone  5 mg Oral BID  . pantoprazole  40 mg Oral Daily  . polyethylene glycol  17 g Oral BID  . senna-docusate  2 tablet Oral BID  . sulfamethoxazole-trimethoprim  1 tablet Oral Q12H   Continuous Infusions:  PRN Meds:.acetaminophen, mirtazapine, morphine injection, ondansetron, prochlorperazine  REVIEW OF SYSTEMS:    10 Point review of Systems was done is negative except as noted above.   LABORATORY DATA:  I have reviewed the data as listed  . CBC Latest Ref Rng 09/22/2015 09/21/2015 09/20/2015  WBC 4.0 - 10.5 K/uL 2.8(L) 3.0(L) 3.1(L)  Hemoglobin 13.0 - 17.0 g/dL 9.0(L) 8.8(L) 7.5(L)  Hematocrit 39.0 - 52.0 % 27.3(L) 26.5(L) 22.6(L)  Platelets 150 - 400 K/uL 151 118(L) 122(L)    . CMP Latest Ref Rng 09/20/2015 09/19/2015 09/18/2015  Glucose 65 - 99 mg/dL 105(H) 98 107  BUN 6 - 20 mg/dL 9 12 9.4  Creatinine 0.61 - 1.24 mg/dL 0.61 0.70 0.7  Sodium 135 - 145 mmol/L 138 136 140  Potassium 3.5 - 5.1 mmol/L 3.4(L) 4.1 4.2  Chloride 101 - 111 mmol/L 106 104 -  CO2 22 - 32 mmol/L '25 23 27  ' Calcium 8.9 - 10.3 mg/dL 8.1(L) 8.4(L) 9.1  Total Protein 6.5 - 8.1 g/dL - 5.7(L) 6.0(L)  Total Bilirubin 0.3 - 1.2 mg/dL - 0.8 0.61  Alkaline Phos 38 - 126 U/L - 76 107  AST 15 - 41 U/L - 26 15  ALT 17 - 63 U/L - 24 24     RADIOGRAPHIC STUDIES: I have personally reviewed the radiological images as listed and agreed with the findings in the report. Dg Chest 2 View  09/19/2015   CLINICAL DATA:  Pt c/o weakness, fever, sob, bilateral lower extremity swelling x several days, pt undergoing chemotherapy.  EXAM: CHEST  2 VIEW  COMPARISON:  09/01/2015  FINDINGS: Heart size is accentuated by the AP position of patient. There is persistent patchy density at the lung  bases bilaterally, consistent with atelectasis or infiltrates. No pleural effusions. Aeration has improved over recent studies. Previous cervical fusion.  IMPRESSION: Persistent bilateral lower lobe atelectasis or infiltrates.   Electronically Signed   By: Nolon Nations M.D.   On: 09/19/2015 19:57   Dg Cervical Spine 1 View  09/04/2015   CLINICAL DATA:  Postoperative, surgical complication, subsequent encounter.  EXAM: DG CERVICAL SPINE - 1 VIEW  COMPARISON:  None.  FINDINGS: Patient status post anterior fusion of C4, C5 and C6 without malalignment. The prevertebral soft tissues  are normal.  IMPRESSION: Status post a anterior fusion of cervical spine without malalignment.   Electronically Signed   By: Abelardo Diesel M.D.   On: 09/04/2015 10:56   Dg Chest Port 1 View  09/01/2015   CLINICAL DATA:  ARDS.  EXAM: PORTABLE CHEST - 1 VIEW  COMPARISON:  08/30/2015.  FINDINGS: Left PICC line in stable position . Cardiomegaly. Low lung volumes with stable bibasilar atelectasis and/or infiltrates. No pleural effusion or pneumothorax. Right supraclavicular subcutaneous emphysema is stable.  IMPRESSION: 1. Left PICC line stable position. 2. Low lung volumes with stable bibasilar subsegmental atelectasis and/or infiltrates. 3. Stable cardiomegaly. 4. Right supraclavicular subcutaneous emphysema unchanged. No pneumothorax.   Electronically Signed   By: Marcello Moores  Register   On: 09/01/2015 07:02   Dg Chest Port 1 View  08/30/2015   CLINICAL DATA:  Respiratory failure/hypoxia  EXAM: PORTABLE CHEST - 1 VIEW  COMPARISON:  August 29, 2015  FINDINGS: Endotracheal tube and nasogastric tube have been removed. Central catheter tip is in the superior vena cava near the cavoatrial junction, stable. There is no demonstrable pneumothorax. There is, however, subcutaneous air, primarily on the right, stable. There is patchy airspace disease in both lower lobes, slightly more on the left than on the right, stable. No new opacity. Heart  size and pulmonary vascularity are normal. No adenopathy. No bone lesions.  IMPRESSION: There is subcutaneous air, primarily on the right, but no pneumothorax appreciable. Patchy airspace disease in the bases is stable. No new opacity. No change in cardiac silhouette.   Electronically Signed   By: Lowella Grip III M.D.   On: 08/30/2015 07:03   Dg Chest Port 1 View  08/29/2015   CLINICAL DATA:  ARDS.  EXAM: PORTABLE CHEST - 1 VIEW  COMPARISON:  08/28/2015.  FINDINGS: Endotracheal tube, NG tube, left PICC line in stable position. Heart size stable. Diffuse bilateral airspace disease with basilar atelectasis noted. No pleural effusion pneumothorax. Right chest wall subcutaneous emphysema again noted .  IMPRESSION: 1. Lines and tubes in stable position. 2. Diffuse bilateral airspace disease with bibasilar atelectasis. 3. Right chest wall subcutaneous emphysema again noted. No pneumothorax.   Electronically Signed   By: Marcello Moores  Register   On: 08/29/2015 07:10   Dg Chest Port 1 View  08/28/2015   CLINICAL DATA:  69 year old male with increasing oxygen requirements and known subcutaneous emphysema. Evaluate for pneumothorax.  EXAM: PORTABLE CHEST - 1 VIEW  COMPARISON:  Prior chest x-ray earlier today 08/28/2015 1:39 a.m.  FINDINGS: Tracheostomy tube is 4.8 cm above the carina. Left upper extremity PICC remains in good position with the tip overlying the superior cavoatrial junction. A nasogastric tube is present. The tip overlies the upper stomach in unchanged position.  Persistent mediastinal air with increasing subcutaneous emphysema in the soft tissues of the right neck and shoulder. Additionally, there is progressive opacification of the right lung base with developing air bronchograms. There is associated volume loss. This is favored to represent atelectasis. Aeration in the left lung base has improved compared to the prior imaging. No acute osseous abnormality.  IMPRESSION: 1. Progressive opacification and  volume loss in the right lower lobe concerning for increasing atelectasis versus developing infiltrate. Given the rapid interval change, atelectasis is strongly favored. 2. No evidence of pneumothorax although there is increasing subcutaneous emphysema in the soft tissues of the right neck, shoulder and lateral chest. 3. Improved aeration in the left lung base likely reflecting resolving left basilar atelectasis. 4. Stable and satisfactory  support apparatus.   Electronically Signed   By: Jacqulynn Cadet M.D.   On: 08/28/2015 09:58   Dg Chest Port 1 View  08/28/2015   CLINICAL DATA:  ARDS.  History of lymphoma.  Worsening hypoxia.  EXAM: PORTABLE CHEST - 1 VIEW  COMPARISON:  08/27/2015  FINDINGS: Endotracheal tube with tip measuring 7.2 cm above the carinal. Enteric tube tip is in the upper stomach just below the EG junction. Left PICC catheter with tip over the cavoatrial junction region. Shallow inspiration. Patchy linear infiltrates in the mid lungs and right lower lung appear generally stable since previous study. No blunting of costophrenic angles. Subcutaneous emphysema in the right neck and anterior chest wall without definite change. Pneumomediastinum on the right. No definite pneumothorax.  IMPRESSION: Appliances are unchanged in position. Patchy airspace infiltrates in the lungs without change. Subcutaneous emphysema in the right neck and upper chest with pneumo mediastinum. No significant change.   Electronically Signed   By: Lucienne Capers M.D.   On: 08/28/2015 05:20   Dg Chest Port 1 View  08/27/2015   CLINICAL DATA:  ARDS  EXAM: PORTABLE CHEST - 1 VIEW  COMPARISON:  08/26/2015 and prior radiographs  FINDINGS: Cardiomediastinal silhouette is unchanged.  Bilateral interstitial and airspace opacities are unchanged.  An endotracheal tube with tip 7.5 cm above the carina and left-sided PICC line with tip overlying the superior cavoatrial junction again noted.  Right subcutaneous emphysema and  pneumomediastinum again noted.  There is no evidence of pneumothorax.  IMPRESSION: No significant change of interstitial and bilateral airspace opacities, pneumomediastinum and right subcutaneous emphysema. No evidence of pneumothorax.   Electronically Signed   By: Margarette Canada M.D.   On: 08/27/2015 09:25   Dg Chest Port 1 View  08/26/2015   CLINICAL DATA:  Acute respiratory failure.  EXAM: PORTABLE CHEST - 1 VIEW  COMPARISON:  08/25/2015, 08/24/2015, 08/22/2015, 08/19/2015  FINDINGS: Bilateral perihilar interstitial and alveolar airspace opacities. Left basilar airspace disease. No pleural effusion or pneumothorax. Stable cardiomediastinal silhouette. Nasogastric tube coursing below the diaphragm with the tip excluded from the field of view. No acute osseous abnormality. Soft tissue emphysema at the base of the right side of the neck. No acute osseous abnormality.  IMPRESSION: 1. No significant interval change in bilateral perihilar interstitial and alveolar airspace opacities and left basilar airspace disease. Differential considerations include pulmonary edema versus multifocal pneumonia. 2. Soft tissue emphysema at the base of the right side of the neck. This is similar in appearance to the prior exam but new compared with 08/22/2015. Correlate with any interval instrumentation.   Electronically Signed   By: Kathreen Devoid   On: 08/26/2015 09:10   Dg Chest Port 1 View  08/25/2015   CLINICAL DATA:  Respiratory failure intubated patient  EXAM: PORTABLE CHEST - 1 VIEW  COMPARISON:  Portable chest x-ray of August 24, 2015  FINDINGS: The lungs are well-expanded. Confluent interstitial and alveolar opacities persist but are slightly less conspicuous today. The retrocardiac region remains dense with partial obscuration of the hemidiaphragm. The cardiac silhouette remains enlarged. The pulmonary vascularity is more distinct but still engorged.  The endotracheal tube tip lies 8.2 cm above the carina. The  esophagogastric tube tip projects below the inferior margin of the image. The left PICC line tip projects in the mid SVC.  IMPRESSION: Slight interval improvement of bilateral interstitial and alveolar opacities which suggests improving pulmonary edema/ARDS and/or pneumonia.   Electronically Signed   By: David  Martinique M.D.  On: 08/25/2015 07:28   Dg Chest Port 1 View  08/24/2015   CLINICAL DATA:  Respiratory failure, sepsis, ARDS.  EXAM: PORTABLE CHEST - 1 VIEW  COMPARISON:  Portable chest x-ray of August 23, 2015.  FINDINGS: The lungs are adequately inflated. There are fluffy alveolar opacities bilaterally. The retrocardiac region on the left is more dense today with obscuration of the hemidiaphragm. The cardiac silhouette is mildly enlarged. The pulmonary vascularity is indistinct. There is no pneumothorax. The endotracheal tube tip lies 3.7 cm above the crotch of the carina. The esophagogastric tube tip projects below the inferior margin of the image.  IMPRESSION: Slight interval worsening in the appearance of the pulmonary interstitium bilaterally with increasing atelectasis or infiltrate in the left lower lobe. The pattern is consistent with ARDS. Stable mild pulmonary vascular congestion.   Electronically Signed   By: David  Martinique M.D.   On: 08/24/2015 07:35   Dg Chest Port 1v Same Day  08/28/2015   CLINICAL DATA:  Status post bronchoscopy. Evaluate for pneumothorax.  EXAM: PORTABLE CHEST - 1 VIEW SAME DAY  COMPARISON:  08/28/2015 and prior exams  FINDINGS: An endotracheal tube with tip 3.5 cm above the carina, NG tube with tip overlying the proximal stomach and left PICC line with tip overlying the lower SVC again noted.  Right lung airspace disease again noted with peak right lower lung airspace disease/atelectasis.  Mild left basilar atelectasis/ airspace disease again noted.  There is no evidence of pneumothorax.  Right subcutaneous emphysema is again identified.  IMPRESSION: No evidence of  pneumothorax status post bronchoscopy.  Decreased right lower lung atelectasis/ airspace disease. No other significant change.   Electronically Signed   By: Margarette Canada M.D.   On: 08/28/2015 11:26   Dg Abd Portable 1v  09/01/2015   CLINICAL DATA:  Feeding tube placement  EXAM: PORTABLE ABDOMEN - 1 VIEW  COMPARISON:  08/22/2015  FINDINGS: Dobbhoff feeding tube is seen projecting over the stomach along the greater curvature with tip in the region of the antrum/pylorus.  IMPRESSION: Dobbhoff feeding tube as described   Electronically Signed   By: Skipper Cliche M.D.   On: 09/01/2015 12:21   Dg Swallowing Func-speech Pathology  09/04/2015    Objective Swallowing Evaluation:    Patient Details  Name: JAXSTON CHOHAN MRN: [id number] Date of Birth: [birth year]  Today's Date: 09/04/2015 Time: SLP Start Time (ACUTE ONLY): 0835-SLP Stop Time (ACUTE ONLY): 0906 SLP Time Calculation (min) (ACUTE ONLY): 31 min  Past Medical History:  Past Medical History  Diagnosis Date  . History of bleeding ulcers 1990's  . 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 Anxiety   . GERD (gastroesophageal reflux disease)   . H. pylori infection   . History of blood transfusion   . Bone cancer      tumor on C5  . Arm numbness 08/07/15    Limited movement due to tumor on spine  . Diffuse large B cell lymphoma     biposy 07/25/2105   Past Surgical History:  Past Surgical History  Procedure Laterality Date  . Hernia repair Bilateral     with mesh  . Colonoscopy    . Eye surgery Right     catheter  . Anterior cervical corpectomy N/A 07/26/2015    Procedure: Cervical five Corpectomy/Cervical four-six Fusion/Plate ;   Surgeon: Eustace Moore, MD;  Location: [facility];  Service:  Neurosurgery;  Laterality: N/A;  C5 Corpectomy/C4-6 Fusion/Plate C4-6   HPI:  Other  Pertinent Information: 69 y.o. male with a history of Diffuse Large  B-Cell Lymphoma diagnosed 07/2015 on [facility] Chemo Rx, GERD, bone cancer,  cervical spine tumor, bleeding ulcers admitted with complaints  of fevers  and chills and cough with SOB x 2 days. Pt underwent s/p C5 corpectomy  with C4-C6 arthrodesis and anterior cervical plating of C4-C6 8/13.  Intubated 8/30-9/6. CXR 9/5 progressive opacification and volume loss in  the right lower lobe concerning for increasing atelectasis versus  developing infiltrate and repeat CXR 9/7 there is subcutaneous air,  primarily on the right, but no pneumothorax appreciable. Patchy airspace  disease in the bases is stable. No new opacity. No change in cardiac  silhouette. RN reported coughing after ice chips and BSE obtained  yesterday with pt being placed on modified diet.   Pt seen to inform  his/wife of this SLP recommendation for repeat MBS today as he received  tube feeding over the weekend.  Multiple risk factors for silent nature of  dysphagia present.    No Data Recorded  Assessment / Plan / Recommendation CHL IP CLINICAL IMPRESSIONS 09/04/2015  Therapy Diagnosis Moderate pharyngeal phase dysphagia  Clinical Impression Mild pharyngeal dysphagia with ongoing pharyngeal with  improved clearance since prior MBS last Thursday 08/31/15.  Moderate  pharyngeal residuals continues (vallecular more than pyriform sinus) -  worse with solids than liquids - due to decreased tongue base retraction  epiglottic deflection, and laryngeal elevation.  Pt conducting dry  swallows, cough, expectoration during evaluation to compensate and aid  clearance with verbal cue.  He continues to be sensory impaired to  residuals unfortunately.  Trace aspiration of thin via straw noted without  pt sensation.  SLP ?s if dobhoff impacted pharyngeal clearance further due  to narrowed pharynx and epiglottis contacting tube.    Pt has been conducting swallowing exercises consistently over the weekend  and has demonstrated improved phonatory and swallow ability.  Recommend  consider to dc Dobhoff and initiate diet with strict precautions.  Folllow  up SLP indicated for dysphagia management to furhter  strengthen pharyngeal  swallow musculature.  Using live monitor SLP educated pt/spouse to  findings and reinforced effective compensation strategies.  Will continue  to follow for management.  MD paged at [facility] with recommendations.         CHL IP TREATMENT RECOMMENDATION 09/04/2015  Treatment Recommendations Therapy as outlined in treatment plan below     CHL IP DIET RECOMMENDATION 09/04/2015  SLP Diet Recommendations Dysphagia 3 (Mech soft);Thin  Liquid Administration via Thin, prefer water, no straws  Medication Administration Crushed with puree  Compensations Slow rate;Small sips/bites;Multiple dry swallows after each  bite/sip;Follow solids with liquid;Hard cough after swallow;Effortful  swallow, start intake with water  Postural Changes and/or Swallow Maneuvers Upright 90* and for 30 min after  meals     CHL IP OTHER RECOMMENDATIONS 09/04/2015  Recommended Consults (None)  Oral Care Recommendations Oral care BID  Other Recommendations Have oral suction available     CHL IP FOLLOW UP RECOMMENDATIONS 09/01/2015  Follow up Recommendations SNF     CHL IP FREQUENCY AND DURATION 09/04/2015  Speech Therapy Frequency (ACUTE ONLY) min 2x/week  Treatment Duration 1 week     Pertinent Vitals/Pain     SLP Swallow Goals No flowsheet data found.  No flowsheet data found.    CHL IP REASON FOR REFERRAL 09/04/2015  Reason for Referral Objectively evaluate swallowing function     CHL IP ORAL PHASE 09/04/2015  Lips (None)  Tongue (None)  Mucous membranes (None)  Nutritional status (None)  Other (None)  Oxygen therapy (None)  Oral Phase WFL  Oral - Pudding Teaspoon (None)  Oral - Pudding Cup (None)  Oral - Honey Teaspoon (None)  Oral - Honey Cup (None)  Oral - Honey Syringe (None)  Oral - Nectar Teaspoon (None)  Oral - Nectar Cup (None)  Oral - Nectar Straw (None)  Oral - Nectar Syringe (None)  Oral - Ice Chips (None)  Oral - Thin Teaspoon (None)  Oral - Thin Cup (None)  Oral - Thin Straw (None)  Oral - Thin Syringe (None)  Oral - Puree  (None)  Oral - Mechanical Soft (None)  Oral - Regular (None)  Oral - Multi-consistency (None)  Oral - Pill (None)  Oral Phase - Comment (None)      CHL IP PHARYNGEAL PHASE 09/04/2015  Pharyngeal Phase (None)  Pharyngeal - Pudding Teaspoon (None)  Penetration/Aspiration details (pudding teaspoon) (None)  Pharyngeal - Pudding Cup (None)  Penetration/Aspiration details (pudding cup) (None)  Pharyngeal - Honey Teaspoon (None)  Penetration/Aspiration details (honey teaspoon) (None)  Pharyngeal - Honey Cup (None)  Penetration/Aspiration details (honey cup) (None)  Pharyngeal - Honey Syringe (None)  Penetration/Aspiration details (honey syringe) (None)  Pharyngeal - Nectar Teaspoon (None)  Penetration/Aspiration details (nectar teaspoon) (None)  Pharyngeal - Nectar Cup (None)  Penetration/Aspiration details (nectar cup) (None)  Pharyngeal - Nectar Straw (None)  Penetration/Aspiration details (nectar straw) (None)  Pharyngeal - Nectar Syringe (None)  Penetration/Aspiration details (nectar syringe) (None)  Pharyngeal - Ice Chips (None)  Penetration/Aspiration details (ice chips) (None)  Pharyngeal - Thin Teaspoon (None)  Penetration/Aspiration details (thin teaspoon) (None)  Pharyngeal - Thin Cup (None)  Penetration/Aspiration details (thin cup) (None)  Pharyngeal - Thin Straw (None)  Penetration/Aspiration details (thin straw) (None)  Pharyngeal - Thin Syringe (None)  Penetration/Aspiration details (thin syringe') (None)  Pharyngeal - Puree (None)  Penetration/Aspiration details (puree) (None)  Pharyngeal - Mechanical Soft (None)  Penetration/Aspiration details (mechanical soft) (None)  Pharyngeal - Regular (None)  Penetration/Aspiration details (regular) (None)  Pharyngeal - Multi-consistency (None)  Penetration/Aspiration details (multi-consistency) (None)  Pharyngeal - Pill (None)  Penetration/Aspiration details (pill) (None)  Pharyngeal Comment multiple swallows, following solids with liquids,  cough/throat  clear/expectoration helpful      CHL IP CERVICAL ESOPHAGEAL PHASE 09/04/2015  Cervical Esophageal Phase (None)  Pudding Teaspoon (None)  Pudding Cup (None)  Honey Teaspoon (None)  Honey Cup (None)  Honey Straw (None)  Nectar Teaspoon (None)  Nectar Cup (None)  Nectar Straw (None)  Nectar Sippy Cup (None)  Thin Teaspoon (None)  Thin Cup (None)  Thin Straw (None)  Thin Sippy Cup (None)  Cervical Esophageal Comment mildly decreased clearance of liquids into  upper esophagus, improved compared to prior eval    No flowsheet data found.         Luanna Salk, [facility] SLP [phone]    Dg Swallowing Func-speech Pathology  08/31/2015    Objective Swallowing Evaluation:    Patient Details  Name: HARDY HARCUM MRN: [id number] Date of Birth: 08/06/1946  Today's Date: 08/31/2015 Time: SLP Start Time (ACUTE ONLY): 1402-SLP Stop Time (ACUTE ONLY): 1426 SLP Time Calculation (min) (ACUTE ONLY): 24 min  Past Medical History:  Past Medical History  Diagnosis Date  . History of bleeding ulcers 1990's  . 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 Anxiety   . GERD (gastroesophageal reflux disease)   . H. pylori infection   . History of blood transfusion   .  Bone cancer      tumor on C5  . Arm numbness 08/07/15    Limited movement due to tumor on spine  . Diffuse large B cell lymphoma     biposy 07/25/2105   Past Surgical History:  Past Surgical History  Procedure Laterality Date  . Hernia repair Bilateral     with mesh  . Colonoscopy    . Eye surgery Right     catheter  . Anterior cervical corpectomy N/A 07/26/2015    Procedure: Cervical five Corpectomy/Cervical four-six Fusion/Plate ;   Surgeon: Eustace Moore, MD;  Location: [facility];  Service:  Neurosurgery;  Laterality: N/A;  C5 Corpectomy/C4-6 Fusion/Plate C4-6   HPI:  Other Pertinent Information: 69 y.o. male with a history of Diffuse Large  B-Cell Lymphoma diagnosed 07/2015 on R-CHOP Chemo Rx, GERD, bone cancer,  cervical spine tumor, bleeding ulcers admitted with complaints of fevers  and  chills and cough with SOB x 2 days. Pt underwent s/p C5 corpectomy  with C4-C6 arthrodesis and anterior cervical plating of C4-C6 8/13.  Intubated 8/30-9/6. CXR 9/5 progressive opacification and volume loss in  the right lower lobe concerning for increasing atelectasis versus  developing infiltrate and repeat CXR 9/7 there is subcutaneous air,  primarily on the right, but no pneumothorax appreciable. Patchy airspace  disease in the bases is stable. No new opacity. No change in cardiac  silhouette. RN reported coughing after ice chips and BSE obtained  yesterday with pt being placed on modified diet.  Today pt seen to inform  his/wife of this SLP recommendation for MBS due to mulitple risk factors  for silent nature of dysphagia.    No Data Recorded  Assessment / Plan / Recommendation CHL IP CLINICAL IMPRESSIONS 08/31/2015  Therapy Diagnosis Moderate oral phase dysphagia;Moderate pharyngeal phase  dysphagia  Clinical Impression Moderate sensorimotor oropharyngeal dysphagia Pt with  moderate sensorimotor oropharyngeal dysphagia with trace aspiration of  thin liquid below vocal cords = with subtle throat clearing. Largest  issue is weakness resulting in pharyngeal residuals *vallecular more than  pyriform* across all consistencies without awareness/sensation. Thicker  viscosity resulted in increased residuals. Dry nor liquid swallows  cleared residuals and pt unable to perform "expectoration" hock to clear.  Pt will be an aspiration risk regardless of consistency due to residuals  that he does not sense nor is able to clear. But he may be less of a risk  with liquids due to fewer residuals.Using live monitor, educated pt to  findings.   At end of study, patient stated he has no problems swallowing - which is  different information than he provided earlier.Encouraged pt to  strengthen his "hocking" ability to help remove vallecular stasis.  Uncertain to level swallowing will improve given pt's medical diagnosis   and prior patient report of hoarseness/speech/swallow deficits for 2 weeks  prior to admit. Options: Full liquids with accepted risks   Multiple  swallows with each bolus.  Intermittent throat clear (especially if vocal  quality is wet)  Expectoration throughout intake as able.  Stop intake if  pt coughing  OR NPO except ice chips and tsps water.  Consider repeat MBS  with improved medical status.         CHL IP TREATMENT RECOMMENDATION 08/31/2015  Treatment Recommendations Therapy as outlined in treatment plan below     CHL IP DIET RECOMMENDATION 08/31/2015  SLP Diet Recommendations NPO;Ice chips PRN after oral care or full liquid  with accepted aspiration risk  Liquid Administration via Cup, straw  Medication Administration Crushed with puree  Compensations Slow rate;Small sips/bites;Multiple dry swallows after each  bite/sip;Clear throat intermittently, cough if voice wet/gurgly  Postural Changes and/or Swallow Maneuvers Stay upright after meals     CHL IP OTHER RECOMMENDATIONS 08/31/2015  Recommended Consults (None)  Oral Care Recommendations Oral care QID  Other Recommendations (None)     No flowsheet data found.   CHL IP FREQUENCY AND DURATION 08/31/2015  Speech Therapy Frequency (ACUTE ONLY) min 1 x/week  Treatment Duration 1 week         CHL IP REASON FOR REFERRAL 08/31/2015  Reason for Referral Objectively evaluate swallowing function     CHL IP ORAL PHASE 08/31/2015  Lips (None)  Tongue (None)  Mucous membranes (None)  Nutritional status (None)  Other (None)  Oxygen therapy (None)  Oral Phase Impaired  Oral - Pudding Teaspoon (None)  Oral - Pudding Cup (None)  Oral - Honey Teaspoon (None)  Oral - Honey Cup (None)  Oral - Honey Syringe (None)  Oral - Nectar Teaspoon (None)  Oral - Nectar Cup (None)  Oral - Nectar Straw (None)  Oral - Nectar Syringe (None)  Oral - Ice Chips (None)  Oral - Thin Teaspoon (None) Oral - Thin Cup (None)  Oral - Thin Straw (None)  Oral - Thin Syringe (None)  Oral - Puree (None)  Oral -  Mechanical Soft (None)  Oral - Regular (None)  Oral - Multi-consistency (None)  Oral - Pill (None)  Oral Phase - Comment (None)      CHL IP PHARYNGEAL PHASE 08/31/2015  Pharyngeal Phase Impaired  Pharyngeal - Pudding Teaspoon (None)  Penetration/Aspiration details (pudding teaspoon) (None)  Pharyngeal - Pudding Cup (None)  Penetration/Aspiration details (pudding cup) (None)  Pharyngeal - Honey Teaspoon (None)  Penetration/Aspiration details (honey teaspoon) (None)  Pharyngeal - Honey Cup (None)  Penetration/Aspiration details (honey cup) (None)  Pharyngeal - Honey Syringe (None)  Penetration/Aspiration details (honey syringe) (None)  Pharyngeal - Nectar Teaspoon (None)  Penetration/Aspiration details (nectar teaspoon) (None)  Pharyngeal - Nectar Cup (None)  Penetration/Aspiration details (nectar cup) (None)  Pharyngeal - Nectar Straw (None)  Penetration/Aspiration details (nectar straw) (None)  Pharyngeal - Nectar Syringe (None)  Penetration/Aspiration details (nectar syringe) (None)  Pharyngeal - Ice Chips (None)  Penetration/Aspiration details (ice chips) (None)  Pharyngeal - Thin Teaspoon (None)  Penetration/Aspiration details (thin teaspoon) (None)  Pharyngeal - Thin Cup (None)  Penetration/Aspiration details (thin cup) (None)  Pharyngeal - Thin Straw (None)  Penetration/Aspiration details (thin straw) (None)  Pharyngeal - Thin Syringe (None)  Penetration/Aspiration details (thin syringe') (None)  Pharyngeal - Puree (None)  Penetration/Aspiration details (puree) (None)  Pharyngeal - Mechanical Soft (None)  Penetration/Aspiration details (mechanical soft) (None)  Pharyngeal - Regular (None)  Penetration/Aspiration details (regular) (None)  Pharyngeal - Multi-consistency (None)  Penetration/Aspiration details (multi-consistency) (None)  Pharyngeal - Pill (None)  Penetration/Aspiration details (pill) (None)  Pharyngeal Comment pt WITHOUT sensation to residuals nor did he clear them  with CUED dry swallows due to  gross weakness, pt did not clear residuals  with cued "hocking" either although he made a good effort      CHL IP CERVICAL ESOPHAGEAL PHASE 08/31/2015  Cervical Esophageal Phase Impaired  Pudding Teaspoon (None)  Pudding Cup (None)  Honey Teaspoon (None)  Honey Cup (None)  Honey Straw (None)  Nectar Teaspoon (None)  Nectar Cup (None)  Nectar Straw (None)  Nectar Sippy Cup (None)  Thin Teaspoon (None)  Thin Cup (None)  Thin  Straw (None)  Thin Sippy Cup (None)  Cervical Esophageal Comment decreased clearance into upper  esophagus-pyriform sinus residuals             Luanna Salk, MS [facility] SLP [phone]    US Abdomen Limited Ruq  09/03/2015   CLINICAL DATA:  69 year old male with a history of transaminitis.  EXAM: US ABDOMEN LIMITED - RIGHT UPPER QUADRANT  COMPARISON:  None.  FINDINGS: Gallbladder:  Fundus of the gallbladder not visualized secondary to bowel gas. Negative sonographic Murphy sign. Visualized gallbladder wall not thickened. Uniformly anechoic fluid within the gallbladder with no stones or sludge identified. No pericholecystic fluid.  Common bile duct:  Diameter: 3 mm  Liver:  No focal lesion identified. Within normal limits in parenchymal echogenicity.  IMPRESSION: Sonographic survey negative for acute cholecystitis.  Signed,  Dulcy Fanny. Earleen Newport, DO  [facility]  [facility]   Electronically Signed   By: Corrie Mckusick D.O.   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  He had a C5 corpectomy on 07/26/2015 that showed diffuse large B-cell lymphoma with a Ki-67 ranging from 20 to 90%. Plan Patient received first cycle of R CHOP on 08/08/2015 and intrathecal methotrexate with hydrocortisone on 08/09/2015. Patient subsequently was admitted with ARDS likely due to neulasta -treated with high dose steroids and  empirically with broad spectrum antibiotics.  2nd cycle of chemotherapy delayed to allow for rehabilitation (was due on 08/31/2015). Dose reduced to R mini CHOP dosing. Tolerated second cycle well with no significant cytopenias. Though required 1 unit of PRBCs during this hospitalization. Plan -Continue with his physical and occupational therapy at SNF upon discharge. -no G-CSF/neulasta due to issues with ARDS -PET/CT prior to cycle 3 scheduled for 09/29/2015. -Discussed with RN to try to reschedule his port from 09/25/2015 to 09/29/2015. -We'll decide on third cycle dosing based on PET/CT results and patient's functional status.  #2 right lower extremity cellulitis and DVT -Treated with IV antibiotics and switched to oral anti-biotics on discharge -On IV heparin while hospitalized will be switched to Apixaban on discharge. Would plan on treating for 6 months if tolerated.  2)Acid reflux and gastritis likely related to steroid use -controlled -Continue PPI  3) Decubitus Ulcer over sacral area  -received wound care attention in the hospital - continue wound care and the skilled nursing facility -Has a wound care clinic follow-up on 10/06/2015  4) Debility due to recent hospitalization 5) C5 surgery - now off c -collar 6) s/ dysphagia - now resolved 7) Insomnia/Depression 8) Significant muscle loss due to steroids/hospitalization/debility Plan - -Oxandrolone for anabolic effect to try to build muscle for about one to 2 months  -aggressive ongoing participation with PT/OT -continue ongoing encouragement.  I spent 30 minutes counseling the patient face to face. The total time spent in the appointment was 40 minutes and more than 50% was on counseling and direct patient cares.    Sullivan Lone MD [place] AAHIVMS [facility] Hematology/Oncology Physician [facility]  (Office):       [phone] (Work cell):  [phone] (Fax):           [phone]  09/22/2015 2:45 PM

## 2015-09-22 NOTE — Progress Notes (Signed)
HYDROTHERAPY TREATMENT    09/22/15 1200  Subjective Assessment  Subjective im ok  Evaluation and Treatment  Evaluation and Treatment Procedures Explained to Patient/Family Yes  Evaluation and Treatment Procedures agreed to  Pressure Ulcer 09/19/15 Stage II -  Partial thickness loss of dermis presenting as a shallow open ulcer with a red, pink wound bed without slough.  Date First Assessed/Time First Assessed: 09/19/15 2305   Location: Sacrum  Location Orientation: Posterior  Staging: Stage II -  Partial thickness loss of dermis presenting as a shallow open ulcer with a red, pink wound bed without slough.  Present on...  Dressing Type Foam;Gauze (santyl, wet-to-dry, allevyn)  Dressing Change Frequency Daily  Site / Wound Assessment Yellow  % Wound base Red or Granulating 5%  % Wound base Yellow 95%  Peri-wound Assessment Erythema (non-blanchable);Induration;Maceration  Drainage Amount Minimal  Wound Therapy Goals - Improve the function of patient's integumentary system by progressing the wound(s) through the phases of wound healing by:  Decrease Necrotic Tissue - Progress Progressing toward goal  Increase Granulation Tissue - Progress Progressing toward goal  Decrease Length/Width/Depth - Progress Progressing toward goal  Weston Anna, MPT [phone]

## 2015-09-24 LAB — CULTURE, BLOOD (ROUTINE X 2)
Culture: NO GROWTH
Culture: NO GROWTH

## 2015-09-25 ENCOUNTER — Ambulatory Visit (HOSPITAL_COMMUNITY): Admission: RE | Admit: 2015-09-25 | Payer: PPO | Source: Ambulatory Visit

## 2015-09-25 ENCOUNTER — Encounter: Payer: Self-pay | Admitting: *Deleted

## 2015-09-25 ENCOUNTER — Inpatient Hospital Stay (HOSPITAL_COMMUNITY): Admission: RE | Admit: 2015-09-25 | Payer: Self-pay | Source: Ambulatory Visit

## 2015-09-25 ENCOUNTER — Other Ambulatory Visit: Payer: Self-pay

## 2015-09-27 ENCOUNTER — Other Ambulatory Visit: Payer: Self-pay | Admitting: Radiology

## 2015-09-28 ENCOUNTER — Other Ambulatory Visit: Payer: Self-pay | Admitting: Radiology

## 2015-09-28 ENCOUNTER — Encounter: Payer: Self-pay | Admitting: *Deleted

## 2015-09-28 ENCOUNTER — Ambulatory Visit (HOSPITAL_COMMUNITY)
Admit: 2015-09-28 | Discharge: 2015-09-28 | Disposition: A | Discharge status: Home / Self Care | Payer: PPO | Attending: Hematology | Admitting: Hematology

## 2015-09-28 ENCOUNTER — Other Ambulatory Visit: Payer: Self-pay | Admitting: *Deleted

## 2015-09-28 DIAGNOSIS — C8330 Diffuse large B-cell lymphoma, unspecified site: Secondary | ICD-10-CM

## 2015-09-28 MED ORDER — LIDOCAINE HCL 1 % IJ SOLN
INTRAMUSCULAR | Status: AC
  Filled 2015-09-28: qty 20

## 2015-09-28 MED ORDER — SODIUM CHLORIDE 0.9 % IV SOLN: Freq: Once | INTRAVENOUS | Status: DC

## 2015-09-28 MED ORDER — CEFAZOLIN SODIUM-DEXTROSE 2-3 GM-% IV SOLR: 2.0000 g | INTRAVENOUS | Status: DC

## 2015-09-28 NOTE — Progress Notes (Signed)
Per Dr. Irene Limbo, ok for patient to hold Eliquis for 48 hrs prior to port placement.

## 2015-09-28 NOTE — Progress Notes (Signed)
Paged PA Rhona Raider and asked if she would be up to see pt soon.  The PA states they have spoken to the pt's oncologist and they are going to offer a picline to pt.  She states she will be up to discuss with pt in the next half hour.  PA states to go ahead and mention the picline to the pt.  Pt and pt's wife were informed of the following information.  Informed them to discuss amongst themselves and ask the PA any questions they have when she arrives. Pt and his wife voiced understanding.

## 2015-09-28 NOTE — Progress Notes (Signed)
Pt decided against picline today.  Pt will have portacath placed Monday 10/10 and then have chemo later that day. Pt was d/c home from radiology.

## 2015-09-28 NOTE — Progress Notes (Signed)
Patient taken to radiology for PICC placement. No nursing pre-op assessment in computer done as original procedure (port) cancelled upon arrival (see prior notes) and as soon as PICC discussion with PA took place patient sent to radiology for procedure. Patient to radiology on stretcher with wife beside him with all of his belongings in his personal wheelchair.

## 2015-09-28 NOTE — Progress Notes (Signed)
Gave pt a gingerale and a coke and wife had pt some soup.  Since procedure is going to be canceled allowed pt to eat and drink at this time.

## 2015-09-28 NOTE — Progress Notes (Signed)
Pt in short stay for pre-precedure portacath placement.  Pt had eliquis last night 10/5 at [place], PA was notified.  She spoke with Dr.Wagoner and he states pt will have to be rescheduled because eliquis should be stopped 48 hours before procedure.  Pt and his wife were notified that he would be rescheduled and were very displeased about the miscommunication with [facility].  Pt resides currently at [facility] and they were apparently unaware that the med needed to be held.  Informed wife and pt that Rhona Raider, [place] would come up and speak with them.

## 2015-09-29 ENCOUNTER — Other Ambulatory Visit: Payer: Self-pay | Admitting: Hematology

## 2015-09-29 ENCOUNTER — Other Ambulatory Visit: Payer: Self-pay | Admitting: Radiology

## 2015-09-29 ENCOUNTER — Encounter (HOSPITAL_COMMUNITY)
Admission: RE | Admit: 2015-09-29 | Discharge: 2015-09-29 | Disposition: A | Discharge status: Home / Self Care | Payer: PPO | Source: Ambulatory Visit | Attending: Hematology | Admitting: Hematology

## 2015-09-29 ENCOUNTER — Encounter: Payer: Self-pay | Admitting: Pharmacist

## 2015-09-29 ENCOUNTER — Encounter (HOSPITAL_COMMUNITY): Payer: Self-pay | Admitting: Pharmacist

## 2015-09-29 DIAGNOSIS — Z8249 Family history of ischemic heart disease and other diseases of the circulatory system: Secondary | ICD-10-CM | POA: Insufficient documentation

## 2015-09-29 DIAGNOSIS — R918 Other nonspecific abnormal finding of lung field: Secondary | ICD-10-CM | POA: Insufficient documentation

## 2015-09-29 DIAGNOSIS — C8330 Diffuse large B-cell lymphoma, unspecified site: Secondary | ICD-10-CM

## 2015-09-29 DIAGNOSIS — Z87891 Personal history of nicotine dependence: Secondary | ICD-10-CM | POA: Insufficient documentation

## 2015-09-29 DIAGNOSIS — K219 Gastro-esophageal reflux disease without esophagitis: Secondary | ICD-10-CM | POA: Diagnosis not present

## 2015-09-29 DIAGNOSIS — Z86718 Personal history of other venous thrombosis and embolism: Secondary | ICD-10-CM | POA: Insufficient documentation

## 2015-09-29 DIAGNOSIS — Z79899 Other long term (current) drug therapy: Secondary | ICD-10-CM | POA: Diagnosis not present

## 2015-09-29 DIAGNOSIS — C8339 Diffuse large B-cell lymphoma, extranodal and solid organ sites: Secondary | ICD-10-CM | POA: Insufficient documentation

## 2015-09-29 DIAGNOSIS — Z7902 Long term (current) use of antithrombotics/antiplatelets: Secondary | ICD-10-CM | POA: Insufficient documentation

## 2015-09-29 DIAGNOSIS — C8338 Diffuse large B-cell lymphoma, lymph nodes of multiple sites: Secondary | ICD-10-CM | POA: Diagnosis not present

## 2015-09-29 LAB — GLUCOSE, CAPILLARY: Glucose-Capillary: 92 mg/dL (ref 65–99)

## 2015-09-29 MED ORDER — FLUDEOXYGLUCOSE F - 18 (FDG) INJECTION
9.7400 | Freq: Once | INTRAVENOUS | Status: DC | PRN
  Administered 2015-09-29: 9.74 via INTRAVENOUS
  Filled 2015-09-29: qty 9.74

## 2015-09-29 NOTE — Patient Outreach (Signed)
[facility]) Care Management  09/29/2015  Ethan Stewart 06-Jan-1946 [id number]   CSW was able to meet with patient and patient's wife, [name] at [place] [place] where patient currently resides to receive rehabilitative services, today to perform a routine visit.  Patient admitted that he is progressing well with therapies and is confident that he will be able to return home within the next week to perform activities of daily living independently.  CSW spoke with patient's Physical Therapist at [facility], who reported that patient is scheduled for discharge home on Wednesday, October 12th.  [name] indicated that he will make arrangements for patient to receive home health physical therapy, through an agency of choice, upon discharging patient from the skilled facility.  [name] also agreed to order all necessary durable medical equipment for patient.  Patient, nor [name], were able to identify any social work specific needs at present.  Therefore, CSW agreed to meet with patient and Mrs. [name] at [facility] on Wednesday, October 12th, prior to patient being released from the facility, to ensure no additional social work needs have been identified.  CSW will fax a correspondence letter to patient's Primary Care Physician, Dr. Lona Kettle.  CSW will notify patient's RNCM with [facility] of patient's plans to discharge from [facility] on Wednesday, October 12th. Nat Christen, BSW, MSW, LCSW  Licensed Education officer, [facility]  Mailing [facility] [address], [place], [place] [address] Physical Address-[address], [place], [place] [address] Toll Free Main # [phone] Fax # [phone] Cell # [phone]  Fax # [phone]  [email or web address]

## 2015-10-02 ENCOUNTER — Ambulatory Visit (HOSPITAL_BASED_OUTPATIENT_CLINIC_OR_DEPARTMENT_OTHER): Payer: PPO | Admitting: Hematology

## 2015-10-02 ENCOUNTER — Encounter (HOSPITAL_COMMUNITY): Payer: Self-pay

## 2015-10-02 ENCOUNTER — Other Ambulatory Visit: Payer: Self-pay | Admitting: Hematology

## 2015-10-02 ENCOUNTER — Ambulatory Visit (HOSPITAL_COMMUNITY)
Admission: RE | Admit: 2015-10-02 | Discharge: 2015-10-02 | Disposition: A | Discharge status: Home / Self Care | Payer: PPO | Source: Ambulatory Visit | Attending: Hematology | Admitting: Hematology

## 2015-10-02 ENCOUNTER — Telehealth: Payer: Self-pay | Admitting: Hematology

## 2015-10-02 ENCOUNTER — Encounter: Payer: Self-pay | Admitting: Hematology

## 2015-10-02 ENCOUNTER — Other Ambulatory Visit: Payer: Self-pay

## 2015-10-02 ENCOUNTER — Ambulatory Visit (HOSPITAL_BASED_OUTPATIENT_CLINIC_OR_DEPARTMENT_OTHER): Payer: PPO

## 2015-10-02 VITALS — BP 118/65 | HR 77 | Temp 98.7°F

## 2015-10-02 DIAGNOSIS — C8330 Diffuse large B-cell lymphoma, unspecified site: Secondary | ICD-10-CM

## 2015-10-02 DIAGNOSIS — L89159 Pressure ulcer of sacral region, unspecified stage: Secondary | ICD-10-CM | POA: Diagnosis not present

## 2015-10-02 DIAGNOSIS — F419 Anxiety disorder, unspecified: Secondary | ICD-10-CM

## 2015-10-02 DIAGNOSIS — C8338 Diffuse large B-cell lymphoma, lymph nodes of multiple sites: Secondary | ICD-10-CM

## 2015-10-02 DIAGNOSIS — Z87891 Personal history of nicotine dependence: Secondary | ICD-10-CM

## 2015-10-02 DIAGNOSIS — Z85830 Personal history of malignant neoplasm of bone: Secondary | ICD-10-CM | POA: Insufficient documentation

## 2015-10-02 DIAGNOSIS — Z86718 Personal history of other venous thrombosis and embolism: Secondary | ICD-10-CM | POA: Insufficient documentation

## 2015-10-02 DIAGNOSIS — Z5111 Encounter for antineoplastic chemotherapy: Secondary | ICD-10-CM | POA: Diagnosis not present

## 2015-10-02 DIAGNOSIS — Z5112 Encounter for antineoplastic immunotherapy: Secondary | ICD-10-CM | POA: Diagnosis not present

## 2015-10-02 DIAGNOSIS — I82401 Acute embolism and thrombosis of unspecified deep veins of right lower extremity: Secondary | ICD-10-CM

## 2015-10-02 DIAGNOSIS — Z7901 Long term (current) use of anticoagulants: Secondary | ICD-10-CM | POA: Insufficient documentation

## 2015-10-02 DIAGNOSIS — K219 Gastro-esophageal reflux disease without esophagitis: Secondary | ICD-10-CM

## 2015-10-02 DIAGNOSIS — F329 Major depressive disorder, single episode, unspecified: Secondary | ICD-10-CM

## 2015-10-02 DIAGNOSIS — M6250 Muscle wasting and atrophy, not elsewhere classified, unspecified site: Secondary | ICD-10-CM

## 2015-10-02 DIAGNOSIS — G4700 Insomnia, unspecified: Secondary | ICD-10-CM

## 2015-10-02 LAB — CBC WITH DIFFERENTIAL/PLATELET
BASOS PCT: 2 %
Basophils Absolute: 0.1 10*3/uL (ref 0.0–0.1)
EOS PCT: 3 %
Eosinophils Absolute: 0.1 10*3/uL (ref 0.0–0.7)
HEMATOCRIT: 31.1 % — AB (ref 39.0–52.0)
HEMOGLOBIN: 10.1 g/dL — AB (ref 13.0–17.0)
Lymphocytes Relative: 23 %
Lymphs Abs: 1.1 10*3/uL (ref 0.7–4.0)
MCH: 27.8 pg (ref 26.0–34.0)
MCHC: 32.5 g/dL (ref 30.0–36.0)
MCV: 85.7 fL (ref 78.0–100.0)
MONOS PCT: 14 %
Monocytes Absolute: 0.6 10*3/uL (ref 0.1–1.0)
NEUTROS PCT: 58 %
Neutro Abs: 2.7 10*3/uL (ref 1.7–7.7)
Platelets: 602 10*3/uL — ABNORMAL HIGH (ref 150–400)
RBC: 3.63 MIL/uL — AB (ref 4.22–5.81)
RDW: 16.8 % — ABNORMAL HIGH (ref 11.5–15.5)
WBC: 4.6 10*3/uL (ref 4.0–10.5)

## 2015-10-02 LAB — COMPREHENSIVE METABOLIC PANEL
ALK PHOS: 72 U/L (ref 38–126)
ALT: 29 U/L (ref 17–63)
AST: 30 U/L (ref 15–41)
Albumin: 3.2 g/dL — ABNORMAL LOW (ref 3.5–5.0)
Anion gap: 8 (ref 5–15)
BILIRUBIN TOTAL: 0.7 mg/dL (ref 0.3–1.2)
BUN: 14 mg/dL (ref 6–20)
CALCIUM: 9.3 mg/dL (ref 8.9–10.3)
CHLORIDE: 108 mmol/L (ref 101–111)
CO2: 24 mmol/L (ref 22–32)
CREATININE: 0.74 mg/dL (ref 0.61–1.24)
Glucose, Bld: 86 mg/dL (ref 65–99)
Potassium: 4.2 mmol/L (ref 3.5–5.1)
Sodium: 140 mmol/L (ref 135–145)
TOTAL PROTEIN: 6.5 g/dL (ref 6.5–8.1)

## 2015-10-02 LAB — PROTIME-INR
INR: 1.08 (ref 0.00–1.49)
PROTHROMBIN TIME: 14.2 s (ref 11.6–15.2)

## 2015-10-02 MED ORDER — ACETAMINOPHEN 325 MG PO TABS
ORAL_TABLET | ORAL | Status: AC
  Filled 2015-10-02: qty 2

## 2015-10-02 MED ORDER — SODIUM CHLORIDE 0.9 % IV SOLN
400.0000 mg/m2 | Freq: Once | INTRAVENOUS | Status: AC
  Administered 2015-10-02: 880 mg via INTRAVENOUS
  Filled 2015-10-02: qty 44

## 2015-10-02 MED ORDER — SODIUM CHLORIDE 0.9 % IJ SOLN
10.0000 mL | INTRAMUSCULAR | Status: DC | PRN
  Administered 2015-10-02: 10 mL
  Filled 2015-10-02: qty 10

## 2015-10-02 MED ORDER — LIDOCAINE-EPINEPHRINE 2 %-1:100000 IJ SOLN
INTRAMUSCULAR | Status: AC
  Filled 2015-10-02: qty 1

## 2015-10-02 MED ORDER — SODIUM CHLORIDE 0.9 % IV SOLN
375.0000 mg/m2 | Freq: Once | INTRAVENOUS | Status: AC
  Administered 2015-10-02: 800 mg via INTRAVENOUS
  Filled 2015-10-02: qty 80

## 2015-10-02 MED ORDER — DOXORUBICIN HCL CHEMO IV INJECTION 2 MG/ML
50.0000 mg/m2 | Freq: Once | INTRAVENOUS | Status: AC
  Administered 2015-10-02: 112 mg via INTRAVENOUS
  Filled 2015-10-02: qty 56

## 2015-10-02 MED ORDER — HEPARIN SOD (PORK) LOCK FLUSH 100 UNIT/ML IV SOLN
500.0000 [IU] | Freq: Once | INTRAVENOUS | Status: AC | PRN
  Administered 2015-10-02: 500 [IU]
  Filled 2015-10-02: qty 5

## 2015-10-02 MED ORDER — FENTANYL CITRATE (PF) 100 MCG/2ML IJ SOLN
INTRAMUSCULAR | Status: AC
  Filled 2015-10-02: qty 2

## 2015-10-02 MED ORDER — MIDAZOLAM HCL 2 MG/2ML IJ SOLN
INTRAMUSCULAR | Status: AC | PRN
  Administered 2015-10-02: 0.5 mg via INTRAVENOUS
  Administered 2015-10-02: 1 mg via INTRAVENOUS
  Administered 2015-10-02: 0.5 mg via INTRAVENOUS

## 2015-10-02 MED ORDER — ACETAMINOPHEN 325 MG PO TABS
650.0000 mg | ORAL_TABLET | Freq: Once | ORAL | Status: AC
  Administered 2015-10-02: 650 mg via ORAL

## 2015-10-02 MED ORDER — MIDAZOLAM HCL 2 MG/2ML IJ SOLN
INTRAMUSCULAR | Status: AC
  Filled 2015-10-02: qty 4

## 2015-10-02 MED ORDER — VINCRISTINE SULFATE CHEMO INJECTION 1 MG/ML
2.0000 mg | Freq: Once | INTRAVENOUS | Status: AC
  Administered 2015-10-02: 2 mg via INTRAVENOUS
  Filled 2015-10-02: qty 2

## 2015-10-02 MED ORDER — FENTANYL CITRATE (PF) 100 MCG/2ML IJ SOLN
INTRAMUSCULAR | Status: AC | PRN
  Administered 2015-10-02 (×2): 25 ug via INTRAVENOUS

## 2015-10-02 MED ORDER — SODIUM CHLORIDE 0.9 % IV SOLN
Freq: Once | INTRAVENOUS | Status: AC
  Administered 2015-10-02: 13:00:00 via INTRAVENOUS

## 2015-10-02 MED ORDER — SODIUM CHLORIDE 0.9 % IV SOLN
Freq: Once | INTRAVENOUS | Status: AC
  Administered 2015-10-02: 13:00:00 via INTRAVENOUS
  Filled 2015-10-02: qty 8

## 2015-10-02 MED ORDER — CEFAZOLIN SODIUM-DEXTROSE 2-3 GM-% IV SOLR
INTRAVENOUS | Status: AC
  Filled 2015-10-02: qty 50

## 2015-10-02 MED ORDER — CEFAZOLIN SODIUM-DEXTROSE 2-3 GM-% IV SOLR
2.0000 g | Freq: Once | INTRAVENOUS | Status: AC
  Administered 2015-10-02: 2 g via INTRAVENOUS

## 2015-10-02 MED ORDER — HEPARIN SOD (PORK) LOCK FLUSH 100 UNIT/ML IV SOLN
INTRAVENOUS | Status: AC
  Filled 2015-10-02: qty 5

## 2015-10-02 MED ORDER — SODIUM CHLORIDE 0.9 % IV SOLN
INTRAVENOUS | Status: DC
  Administered 2015-10-02: 500 mL via INTRAVENOUS

## 2015-10-02 MED ORDER — DIPHENHYDRAMINE HCL 25 MG PO CAPS
ORAL_CAPSULE | ORAL | Status: AC
  Filled 2015-10-02: qty 2

## 2015-10-02 MED ORDER — DIPHENHYDRAMINE HCL 25 MG PO CAPS
50.0000 mg | ORAL_CAPSULE | Freq: Once | ORAL | Status: AC
  Administered 2015-10-02: 50 mg via ORAL

## 2015-10-02 MED ORDER — LIDOCAINE HCL 1 % IJ SOLN
INTRAMUSCULAR | Status: AC
  Filled 2015-10-02: qty 20

## 2015-10-02 NOTE — H&P (Signed)
Referring Physician(s): Kale,Gautam Kishore  History of Present Illness: Ethan Stewart is a 69 y.o. male with diffuse Large B cell lymphoma with involvement of mesenteric, inguinal lymph nodes and C5 vertebral body with spinal cord impingement. He has been seen by Dr. Irene Limbo 09/22/15 and scheduled today for image guided port a catheter with chemotherapy to follow. He denies any chest pain, shortness of breath or palpitations. He denies any active signs of bleeding or excessive bruising. He denies any recent fever or chills. The patient denies any history of sleep apnea or chronic oxygen use. He has previously tolerated sedation without complications.    Past Medical History  Diagnosis Date  . History of bleeding ulcers 1990's  . 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 Anxiety   . GERD (gastroesophageal reflux disease)   . H. pylori infection   . History of blood transfusion   . Bone cancer ([place])      tumor on C5  . Arm numbness 08/07/15    Limited movement due to tumor on spine  . Diffuse large B cell lymphoma ([place])     biposy 07/25/2105    Past Surgical History  Procedure Laterality Date  . Hernia repair Bilateral     with mesh  . Colonoscopy    . Eye surgery Right     catheter  . Anterior cervical corpectomy N/A 07/26/2015    Procedure: Cervical five Corpectomy/Cervical four-six Fusion/Plate ;  Surgeon: Eustace Moore, MD;  Location: [facility];  Service: Neurosurgery;  Laterality: N/A;  C5 Corpectomy/C4-6 Fusion/Plate C4-6    Allergies: Neulasta  Medications: Prior to Admission medications   Medication Sig Start Date End Date Taking? Authorizing Provider  acetaminophen (TYLENOL) 325 MG tablet Take 2 tablets (650 mg total) by mouth every 6 (six) hours as needed for mild pain, moderate pain, fever or headache (or Fever >/= 101). 09/05/15  Yes Modena Jansky, MD  allopurinol (ZYLOPRIM) 300 MG tablet Take 300 mg by mouth daily.   Yes Historical Provider, MD  [place]  Acids-Protein Hydrolys (FEEDING SUPPLEMENT, PRO-STAT SUGAR FREE 64,) LIQD Take 30 mLs by mouth 2 (two) times daily with a meal.   Yes Historical Provider, MD  collagenase (SANTYL) ointment Apply topically daily. 09/22/15  Yes Charlynne Cousins, MD  mirtazapine (REMERON) 15 MG tablet Take 1 tablet (15 mg total) by mouth at bedtime. Might increase to 30mg  po HS in 7 days if still having insomnia 09/11/15  Yes Brunetta Genera, MD  Multiple Vitamin (MULTIVITAMIN WITH MINERALS) TABS tablet Take 1 tablet by mouth daily.   Yes Historical Provider, MD  nutrition supplement, JUVEN, (JUVEN) PACK Take 1 packet by mouth 2 (two) times daily between meals. Mix in 8 oz of water or juice   Yes Historical Provider, MD  omeprazole (PRILOSEC) 40 MG capsule Take 1 capsule (40 mg total) by mouth daily. 07/24/15  Yes Brunetta Genera, MD  ondansetron (ZOFRAN) 8 MG tablet Take 1 tablet (8 mg total) by mouth 2 (two) times daily. Start the day after chemo for 3 days. Then as needed for nausea or vomiting. 08/08/15  Yes Brunetta Genera, MD  oxandrolone (OXANDRIN) 2.5 MG tablet Take 2 tablets (5 mg total) by mouth 2 (two) times daily. 09/11/15  Yes Brunetta Genera, MD  polyethylene glycol (MIRALAX / GLYCOLAX) packet Take 17 g by mouth daily.   Yes Historical Provider, MD  senna-docusate (SENNA S) 8.6-50 MG per tablet Take 2 tablets by mouth  at bedtime. 09/11/15  Yes Brunetta Genera, MD  sulfamethoxazole-trimethoprim (BACTRIM DS,SEPTRA DS) 800-160 MG tablet Take 1 tablet by mouth every 12 (twelve) hours. 09/22/15  Yes Charlynne Cousins, MD  temazepam (RESTORIL) 15 MG capsule Take 15 mg by mouth at bedtime and may repeat dose one time if needed.   Yes Historical Provider, MD  apixaban (ELIQUIS) 5 MG TABS tablet Take 1 tablet (5 mg total) by mouth 2 (two) times daily. 09/29/15   Charlynne Cousins, MD  neomycin-bacitracin-polymyxin (NEOSPORIN) ointment Apply 1 application topically as needed for wound care (left foot  blister).     Historical Provider, MD  [place]    Historical Provider, MD  prochlorperazine (COMPAZINE) 10 MG tablet Take 1 tablet (10 mg total) by mouth every 6 (six) hours as needed (Nausea or vomiting). 08/08/15   Brunetta Genera, MD     Family History  Problem Relation Age of Onset  . Hypertension Mother     Social History   Social History  . Marital Status: Married    Spouse Name: N/A  . Number of Children: N/A  . Years of Education: N/A   Social History Main Topics  . Smoking status: Former Smoker -- 1.00 packs/day for 10 years    Types: Cigarettes    Quit date: 12/23/1981  . Smokeless tobacco: Never Used  . Alcohol Use: No  . Drug Use: No  . Sexual Activity: Yes   Other Topics Concern  . None   Social History Narrative    Review of Systems: A 12 point ROS discussed and pertinent positives are indicated in the HPI above.  All other systems are negative.  Review of Systems  Vital Signs: BP 126/69 mmHg  Pulse 92  Temp(Src) 98.3 F (36.8 C) (Oral)  Resp 18  Ht 6' (1.829 m)  Wt 194 lb 14 oz (88.395 kg)  BMI 26.42 kg/m2  SpO2 99%  Physical Exam  Constitutional: He is oriented to person, place, and time. No distress.  HENT:  Head: Normocephalic and atraumatic.  Cardiovascular: Normal rate and regular rhythm.  Exam reveals no gallop and no friction rub.   No murmur heard. Pulmonary/Chest: Effort normal and breath sounds normal. No respiratory distress. He has no wheezes. He has no rales.  Abdominal: Soft. Bowel sounds are normal. He exhibits no distension. There is no tenderness.  Musculoskeletal: He exhibits no tenderness.  Neurological: He is alert and oriented to person, place, and time.  Skin: Skin is warm and dry. He is not diaphoretic. No erythema.    Mallampati Score:  MD Evaluation Airway: WNL Heart: WNL Abdomen: WNL Chest/ Lungs: WNL ASA  Classification: 2 Mallampati/Airway Score: Two  Imaging: Dg Chest 2  View  09/19/2015   CLINICAL DATA:  Pt c/o weakness, fever, sob, bilateral lower extremity swelling x several days, pt undergoing chemotherapy.  EXAM: CHEST  2 VIEW  COMPARISON:  09/01/2015  FINDINGS: Heart size is accentuated by the AP position of patient. There is persistent patchy density at the lung bases bilaterally, consistent with atelectasis or infiltrates. No pleural effusions. Aeration has improved over recent studies. Previous cervical fusion.  IMPRESSION: Persistent bilateral lower lobe atelectasis or infiltrates.   Electronically Signed   By: Nolon Nations M.D.   On: 09/19/2015 19:57   Dg Cervical Spine 1 View  09/04/2015   CLINICAL DATA:  Postoperative, surgical complication, subsequent encounter.  EXAM: DG CERVICAL SPINE - 1 VIEW  COMPARISON:  None.  FINDINGS: Patient  status post anterior fusion of C4, C5 and C6 without malalignment. The prevertebral soft tissues are normal.  IMPRESSION: Status post a anterior fusion of cervical spine without malalignment.   Electronically Signed   By: Abelardo Diesel M.D.   On: 09/04/2015 10:56   Nm Pet Image Restag (ps) Skull Base To Thigh  09/29/2015   CLINICAL DATA:  Subsequent treatment strategy for diffuse large B-cell lymphoma.  EXAM: NUCLEAR MEDICINE PET SKULL BASE TO THIGH  TECHNIQUE: 9.7 mCi F-18 FDG was injected intravenously. Full-ring PET imaging was performed from the skull base to thigh after the radiotracer. CT data was obtained and used for attenuation correction and anatomic localization.  FASTING BLOOD GLUCOSE:  Value: 92 mg/dl  COMPARISON:  07/10/2015  FINDINGS: NECK  No evidence for hypermetabolic cervical lymph nodes. No hypermetabolic soft tissue.  CHEST  Areas of airspace consolidation in the medial right middle lobe and posterior right lower lobe are hypermetabolic. The right middle lobe disease has SUV max = 3.9. Right lower lobe focal airspace consolidation has SUV max = 5.0. A new small focus of slightly increased FDG uptake is  identified in the right posterior lateral chest wall, just medial to the tip of the scapula. SUV max = 4.2  ABDOMEN/PELVIS  No abnormal hypermetabolic activity within the liver, pancreas, adrenal glands, or spleen.  The left abdominal mesenteric lymph node which was hypermetabolic previously has decreased in the interval with SUV max = 1.4 today compared to 6.7 previously. The node has decreased in size from 18 x 16 mm previously to 8 x 8 mm today.  The previously described hypermetabolic inguinal lymph nodes remain hypermetabolic. The right inguinal lymph node has SUV max = 3.0 today compared to 4.3 previously. The left hypermetabolic inguinal lymph node demonstrates SUV max = 10.0 today compared to 6.2 previously.  There is new hypermetabolism in the subcutaneous soft tissues overlying the lower sacrum. CT imaging shows changes suggesting edema/inflammation in the same region. SUV max = 7.3  SKELETON  Patient is status post interval surgery at the C5 level. There is some persistent low level FDG uptake in the region of surgery with SUV max = 6.1.  IMPRESSION: 1. Interval decrease in size and hypermetabolism of the left mesenteric lymph node. The hypermetabolic inguinal lymph nodes show sent no substantial change in size in the interval with decreased FDG uptake in the right inguinal lymph node and slight interval increase in FDG accumulation within the left inguinal lymph node. 2. New areas of hypermetabolic lung consolidation in the right middle and lower lobes. This may be related to multifocal pneumonia or an inflammatory process with lymphoma considered less likely. 3. Interval surgery at the C5 level. There is some residual hypermetabolism in this region which may be postsurgical healing, but residual tumor not excluded. 4. New hypermetabolic soft tissue stranding in the subcutaneous fat overlying the lower sacrum. This would be atypical for lymphoma and infectious/inflammatory etiology is favored. 5. New  small hypermetabolic focus in the posterior lateral right chest wall, adjacent to the tip of the scapula. Indeterminate finding. Attention on follow-up recommended.   Electronically Signed   By: Misty Stanley M.D.   On: 09/29/2015 12:33   Dg Swallowing Func-speech Pathology  09/04/2015    Objective Swallowing Evaluation:    Patient Details  Name: Ethan Stewart MRN: [id number] Date of Birth: 01/10/1946  Today's Date: 09/04/2015 Time: SLP Start Time (ACUTE ONLY): 0835-SLP Stop Time (ACUTE ONLY): 0906 SLP Time Calculation (min) (ACUTE ONLY):  31 min  Past Medical History:  Past Medical History  Diagnosis Date  . History of bleeding ulcers 1990's  . 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 Anxiety   . GERD (gastroesophageal reflux disease)   . H. pylori infection   . History of blood transfusion   . Bone cancer      tumor on C5  . Arm numbness 08/07/15    Limited movement due to tumor on spine  . Diffuse large B cell lymphoma     biposy 07/25/2105   Past Surgical History:  Past Surgical History  Procedure Laterality Date  . Hernia repair Bilateral     with mesh  . Colonoscopy    . Eye surgery Right     catheter  . Anterior cervical corpectomy N/A 07/26/2015    Procedure: Cervical five Corpectomy/Cervical four-six Fusion/Plate ;   Surgeon: Eustace Moore, MD;  Location: [place] [facility];  Service:  Neurosurgery;  Laterality: N/A;  C5 Corpectomy/C4-6 Fusion/Plate C4-6   HPI:  Other Pertinent Information: 69 y.o. male with a history of Diffuse Large  B-Cell Lymphoma diagnosed 07/2015 on [facility] Chemo Rx, GERD, bone cancer,  cervical spine tumor, bleeding ulcers admitted with complaints of fevers  and chills and cough with SOB x 2 days. Pt underwent s/p C5 corpectomy  with C4-C6 arthrodesis and anterior cervical plating of C4-C6 8/13.  Intubated 8/30-9/6. CXR 9/5 progressive opacification and volume loss in  the right lower lobe concerning for increasing atelectasis versus  developing infiltrate and repeat CXR 9/7 there is subcutaneous  air,  primarily on the right, but no pneumothorax appreciable. Patchy airspace  disease in the bases is stable. No new opacity. No change in cardiac  silhouette. RN reported coughing after ice chips and BSE obtained  yesterday with pt being placed on modified diet.   Pt seen to inform  his/wife of this SLP recommendation for repeat MBS today as he received  tube feeding over the weekend.  Multiple risk factors for silent nature of  dysphagia present.    No Data Recorded  Assessment / Plan / Recommendation CHL IP CLINICAL IMPRESSIONS 09/04/2015  Therapy Diagnosis Moderate pharyngeal phase dysphagia  Clinical Impression Mild pharyngeal dysphagia with ongoing pharyngeal with  improved clearance since prior MBS last Thursday 08/31/15.  Moderate  pharyngeal residuals continues (vallecular more than pyriform sinus) -  worse with solids than liquids - due to decreased tongue base retraction  epiglottic deflection, and laryngeal elevation.  Pt conducting dry  swallows, cough, expectoration during evaluation to compensate and aid  clearance with verbal cue.  He continues to be sensory impaired to  residuals unfortunately.  Trace aspiration of thin via straw noted without  pt sensation.  SLP ?s if dobhoff impacted pharyngeal clearance further due  to narrowed pharynx and epiglottis contacting tube.    Pt has been conducting swallowing exercises consistently over the weekend  and has demonstrated improved phonatory and swallow ability.  Recommend  consider to dc Dobhoff and initiate diet with strict precautions.  Folllow  up SLP indicated for dysphagia management to furhter strengthen pharyngeal  swallow musculature.  Using live monitor SLP educated pt/spouse to  findings and reinforced effective compensation strategies.  Will continue  to follow for management.  MD paged at [place] with recommendations.         CHL IP TREATMENT RECOMMENDATION 09/04/2015  Treatment Recommendations Therapy as outlined in treatment plan below     CHL  IP DIET RECOMMENDATION 09/04/2015  SLP Diet  Recommendations Dysphagia 3 (Mech soft);Thin  Liquid Administration via Thin, prefer water, no straws  Medication Administration Crushed with puree  Compensations Slow rate;Small sips/bites;Multiple dry swallows after each  bite/sip;Follow solids with liquid;Hard cough after swallow;Effortful  swallow, start intake with water  Postural Changes and/or Swallow Maneuvers Upright 90* and for 30 min after  meals     CHL IP OTHER RECOMMENDATIONS 09/04/2015  Recommended Consults (None)  Oral Care Recommendations Oral care BID  Other Recommendations Have oral suction available     CHL IP FOLLOW UP RECOMMENDATIONS 09/01/2015  Follow up Recommendations SNF     CHL IP FREQUENCY AND DURATION 09/04/2015  Speech Therapy Frequency (ACUTE ONLY) min 2x/week  Treatment Duration 1 week     Pertinent Vitals/Pain     SLP Swallow Goals No flowsheet data found.  No flowsheet data found.    CHL IP REASON FOR REFERRAL 09/04/2015  Reason for Referral Objectively evaluate swallowing function     CHL IP ORAL PHASE 09/04/2015  Lips (None)  Tongue (None)  Mucous membranes (None)  Nutritional status (None)  Other (None)  Oxygen therapy (None)  Oral Phase WFL  Oral - Pudding Teaspoon (None)  Oral - Pudding Cup (None)  Oral - Honey Teaspoon (None)  Oral - Honey Cup (None)  Oral - Honey Syringe (None)  Oral - Nectar Teaspoon (None)  Oral - Nectar Cup (None)  Oral - Nectar Straw (None)  Oral - Nectar Syringe (None)  Oral - Ice Chips (None)  Oral - Thin Teaspoon (None)  Oral - Thin Cup (None)  Oral - Thin Straw (None)  Oral - Thin Syringe (None)  Oral - Puree (None)  Oral - Mechanical Soft (None)  Oral - Regular (None)  Oral - Multi-consistency (None)  Oral - Pill (None)  Oral Phase - Comment (None)      CHL IP PHARYNGEAL PHASE 09/04/2015  Pharyngeal Phase (None)  Pharyngeal - Pudding Teaspoon (None)  Penetration/Aspiration details (pudding teaspoon) (None)  Pharyngeal - Pudding Cup (None)  Penetration/Aspiration  details (pudding cup) (None)  Pharyngeal - Honey Teaspoon (None)  Penetration/Aspiration details (honey teaspoon) (None)  Pharyngeal - Honey Cup (None)  Penetration/Aspiration details (honey cup) (None)  Pharyngeal - Honey Syringe (None)  Penetration/Aspiration details (honey syringe) (None)  Pharyngeal - Nectar Teaspoon (None)  Penetration/Aspiration details (nectar teaspoon) (None)  Pharyngeal - Nectar Cup (None)  Penetration/Aspiration details (nectar cup) (None)  Pharyngeal - Nectar Straw (None)  Penetration/Aspiration details (nectar straw) (None)  Pharyngeal - Nectar Syringe (None)  Penetration/Aspiration details (nectar syringe) (None)  Pharyngeal - Ice Chips (None)  Penetration/Aspiration details (ice chips) (None)  Pharyngeal - Thin Teaspoon (None)  Penetration/Aspiration details (thin teaspoon) (None)  Pharyngeal - Thin Cup (None)  Penetration/Aspiration details (thin cup) (None)  Pharyngeal - Thin Straw (None)  Penetration/Aspiration details (thin straw) (None)  Pharyngeal - Thin Syringe (None)  Penetration/Aspiration details (thin syringe') (None)  Pharyngeal - Puree (None)  Penetration/Aspiration details (puree) (None)  Pharyngeal - Mechanical Soft (None)  Penetration/Aspiration details (mechanical soft) (None)  Pharyngeal - Regular (None)  Penetration/Aspiration details (regular) (None)  Pharyngeal - Multi-consistency (None)  Penetration/Aspiration details (multi-consistency) (None)  Pharyngeal - Pill (None)  Penetration/Aspiration details (pill) (None)  Pharyngeal Comment multiple swallows, following solids with liquids,  cough/throat clear/expectoration helpful      CHL IP CERVICAL ESOPHAGEAL PHASE 09/04/2015  Cervical Esophageal Phase (None)  Pudding Teaspoon (None)  Pudding Cup (None)  Honey Teaspoon (None)  Honey Cup (None)  Honey Straw (None)  Nectar Teaspoon (None)  Nectar Cup (None)  Nectar Straw (None)  Nectar Sippy Cup (None)  Thin Teaspoon (None)  Thin Cup (None)  Thin Straw (None)  Thin  Sippy Cup (None)  Cervical Esophageal Comment mildly decreased clearance of liquids into  upper esophagus, improved compared to prior eval    No flowsheet data found.         Luanna Salk, MS [facility] SLP [phone]    US Abdomen Limited Ruq  09/03/2015   CLINICAL DATA:  69 year old male with a history of transaminitis.  EXAM: US ABDOMEN LIMITED - RIGHT UPPER QUADRANT  COMPARISON:  None.  FINDINGS: Gallbladder:  Fundus of the gallbladder not visualized secondary to bowel gas. Negative sonographic Murphy sign. Visualized gallbladder wall not thickened. Uniformly anechoic fluid within the gallbladder with no stones or sludge identified. No pericholecystic fluid.  Common bile duct:  Diameter: 3 mm  Liver:  No focal lesion identified. Within normal limits in parenchymal echogenicity.  IMPRESSION: Sonographic survey negative for acute cholecystitis.  Signed,  Dulcy Fanny. Earleen Newport, DO  [facility]  [facility]   Electronically Signed   By: Corrie Mckusick D.O.   On: 09/03/2015 11:06    Labs:  CBC:  Recent Labs  09/20/15 0548 09/21/15 0440 09/22/15 0515 10/02/15 0715  WBC 3.1* 3.0* 2.8* 4.6  HGB 7.5* 8.8* 9.0* 10.1*  HCT 22.6* 26.5* 27.3* 31.1*  PLT 122* 118* 151 602*    COAGS:  Recent Labs  07/04/15 1014 08/21/15 0316 10/02/15 0715  INR 0.98 1.36 1.08  APTT 23* 54*  --     BMP:  Recent Labs  09/04/15 0633  09/18/15 1051 09/19/15 1926 09/20/15 0548 10/02/15 0715  NA 143  < > 140 136 138 140  K 3.8  < > 4.2 4.1 3.4* 4.2  CL 110  --   --  104 106 108  CO2 26  < > 27 23 25 24   GLUCOSE 105*  < > 107 98 105* 86  BUN 17  < > 9.4 12 9 14   CALCIUM 8.2*  < > 9.1 8.4* 8.1* 9.3  CREATININE 0.50*  < > 0.7 0.70 0.61 0.74  GFRNONAA >60  --   --  >60 >60 >60  GFRAA >60  --   --  >60 >60 >60  < > = values in this interval not displayed.  LIVER FUNCTION TESTS:  Recent Labs  09/11/15 0813 09/18/15 1051 09/19/15 1926 10/02/15 0715  BILITOT 0.40  0.61 0.8 0.7  AST 24 15 26 30   ALT 55 24 24 29   ALKPHOS 146 107 76 72  PROT 5.7* 6.0* 5.7* 6.5  ALBUMIN 2.2* 2.7* 2.8* 3.2*    Assessment and Plan: Diffuse Large B cell lymphoma with involvement of mesenteric, inguinal lymph nodes and C5 vertebral body with spinal cord impingement. Seen by Dr. Irene Limbo 09/22/15 Scheduled today for image guided port a catheter with sedation with chemotherapy to follow today History of DVT- held eliquis 48 hrs  History of RLE Cellulitis s/p antibiotic therapy, improved  The patient has been NPO, no blood thinners taken, labs and vitals have been reviewed. Risks and Benefits discussed with the patient including, but not limited to bleeding, infection, pneumothorax, or fibrin sheath development and need for additional procedures. All of the patient's questions were answered, patient is agreeable to proceed. Consent signed and in chart.    SignedHedy Jacob 10/02/2015, 8:28 AM

## 2015-10-02 NOTE — Progress Notes (Signed)
Ethan Stewart  HEMATOLOGY ONCOLOGY PROGRESS NOTE Date of service 09/11/2015  Patient Care Team: Lona Kettle, MD as PCP - General (Family Medicine) Brunetta Genera, MD as Consulting Physician (Hematology) Francis Gaines, LCSW as [place] Management (Licensed Clinical Social Worker)  SUMMARY OF ONCOLOGIC HISTORY:   DLBCL (diffuse large B cell lymphoma) ([place])   06/21/2015 Imaging CT Chest/abd/pelvis: IMPRESSION: 5 mm nodule within the right lower lobe as described.  Pneumobilia consistent with a prior sphincterotomy.  No acute abnormality is identified. No findings to suggest chest etiology for the known lesion at C5 are seen.   06/21/2015 Imaging MRI c spine1. Diffuse marrow replacement of the C5 vertebral body highly concerning for neoplasm (metastatic disease, myeloma, or lymphoma). Epidural tumor at this level results in moderate spinal stenosis with moderate impression on the spinal cord    07/10/2015 PET scan 1. The previously demonstrated abnormal C5 vertebral body is hypermetabolic. No other osseous lesions demonstrated. 2. There are small hypermetabolic lymph nodes within the left mesentery and both inguinal regions.    07/26/2015 Initial Biopsy Had a C5 corpectomy pathology showed diffuse large B-cell lymphoma   08/08/2015 -  Chemotherapy Started for cycle of R CHOP.    08/09/2015 -  Chemotherapy Received first cycle of intrathecal methotrexate plus hydrocortisone for CNS prophylaxis.   Diagnosis: Diffuse large B-cell lymphoma Stage IVAE with extranodal involvement of C5 vertebra status post C5 corpectomy   INTERVAL HISTORY:  [name] is here for his scheduled follow-up prior to his third cycle of chemotherapy for diffuse large B-cell lymphoma. He notes he is feeling much better and is able to get out of bed and ambulate with a walker himself. He notes that he will likely be discharged home the next couple of days. Leg pain and swelling is resolved.His sacral take his  ulcer is healing well. No fevers or chills. No new enlarged lymph nodes. His blood counts are improved. We talked about his lab results and discuss his PET CT scan. We discussed whether we should continue with R-mini CHOP dosing or increase his doxorubicin dose back to standard CHOP dosing. And he chose to go with the latter. We talked in detail about the need for avoidance of crowds and appropriate infection precautions. No neck pain or new focal neurological deficits.  REVIEW OF SYSTEMS:   10 point review of systems was done and is negative except as noted above  I have reviewed the past medical history, past surgical history, social history and family history with the patient and they are unchanged from previous note.  ALLERGIES:  is allergic to neulasta.  MEDICATIONS:  Current Outpatient Prescriptions  Medication Sig Dispense Refill  . acetaminophen (TYLENOL) 325 MG tablet Take 2 tablets (650 mg total) by mouth every 6 (six) hours as needed for mild pain, moderate pain, fever or headache (or Fever >/= 101).    . Amino Acids-Protein Hydrolys (FEEDING SUPPLEMENT, PRO-STAT SUGAR FREE 64,) LIQD Take 30 mLs by mouth 2 (two) times daily with a meal.    . apixaban (ELIQUIS) 5 MG TABS tablet Take 1 tablet (5 mg total) by mouth 2 (two) times daily. 60 tablet 0  . collagenase (SANTYL) ointment Apply topically daily. 15 g 0  . mirtazapine (REMERON) 15 MG tablet Take 1 tablet (15 mg total) by mouth at bedtime. Might increase to 29m po HS in 7 days if still having insomnia 60 tablet 1  . Multiple Vitamin (MULTIVITAMIN WITH MINERALS) TABS tablet Take 1 tablet  by mouth daily.    Ethan Stewart neomycin-bacitracin-polymyxin (NEOSPORIN) ointment Apply 1 application topically as needed for wound care (left foot blister).     . nutrition supplement, JUVEN, (JUVEN) PACK Take 1 packet by mouth 2 (two) times daily between meals. Mix in 8 oz of water or juice    . omeprazole (PRILOSEC) 40 MG capsule Take 1 capsule (40 mg  total) by mouth daily. 30 capsule 0  . ondansetron (ZOFRAN) 8 MG tablet Take 1 tablet (8 mg total) by mouth 2 (two) times daily. Start the day after chemo for 3 days. Then as needed for nausea or vomiting. 30 tablet 1  . oxandrolone (OXANDRIN) 2.5 MG tablet Take 2 tablets (5 mg total) by mouth 2 (two) times daily. 60 tablet 0  . polyethylene glycol (MIRALAX / GLYCOLAX) packet Take 17 g by mouth daily.    Ethan Stewart PRESCRIPTION MEDICATION [facility] CHEMO    . prochlorperazine (COMPAZINE) 10 MG tablet Take 1 tablet (10 mg total) by mouth every 6 (six) hours as needed (Nausea or vomiting). 30 tablet 6  . senna-docusate (SENNA S) 8.6-50 MG per tablet Take 2 tablets by mouth at bedtime. 60 tablet 1  . sulfamethoxazole-trimethoprim (BACTRIM DS,SEPTRA DS) 800-160 MG tablet Take 1 tablet by mouth every 12 (twelve) hours. 20 tablet 0  . temazepam (RESTORIL) 15 MG capsule Take 15 mg by mouth at bedtime and may repeat dose one time if needed.     No current facility-administered medications for this visit.   Facility-Administered Medications Ordered in Other Visits  Medication Dose Route Frequency Provider Last Rate Last Dose  . 0.9 %  sodium chloride infusion   Intravenous Continuous Hedy Jacob, PA-C 10 mL/hr at 10/02/15 0730 500 mL at 10/02/15 0730  . ceFAZolin (ANCEF) 2-3 GM-% IVPB SOLR           . fentaNYL (SUBLIMAZE) 100 MCG/2ML injection           . fludeoxyglucose F - 18 (FDG) injection 9.74 milli Curie  9.74 milli Curie Intravenous Once PRN Medication Radiologist, MD   9.74 milli Curie at 09/29/15 [phone]  . lidocaine (XYLOCAINE) 1 % (with pres) injection           . lidocaine-EPINEPHrine (XYLOCAINE W/EPI) 2 %-1:100000 (with pres) injection           . midazolam (VERSED) 2 MG/2ML injection             PHYSICAL EXAMINATION: ECOG PERFORMANCE STATUS: 1 - Symptomatic but completely ambulatory  Filed Vitals:   10/02/15 1048  BP: 124/68  Pulse: 110  Temp: 98.2 F (36.8 C)  Resp: 20   Filed Weights    10/02/15 1048  Weight: 193 lb 8 oz (87.771 kg)    GENERAL:alert, no distress and comfortable SKIN: sacral grade 2-3 decubitus ulcer. No significant discharge. Some surrounding skin maceration. EYES: normal, Conjunctiva are pink and non-injected, sclera clear OROPHARYNX:no exudate, no erythema and lips, buccal mucosa, and tongue normal  NECK: supple, thyroid normal size, non-tender, without nodularity LYMPH:no palpable left inguinal adenopathy. LUNGS: clear to auscultation and percussion with normal breathing effort HEART: regular rate & rhythm and no murmurs and no lower extremity edema ABDOMEN:abdomen soft, non-tender and normal bowel sounds Musculoskeletal:no cyanosis of digits and no clubbing  NEURO: alert & oriented x 3 with fluent speech, 4-/5 weakness right upper extremity abduction  LABORATORY DATA:   . CBC Latest Ref Rng 10/02/2015 09/22/2015 09/21/2015  WBC 4.0 - 10.5 K/uL 4.6 2.8(L) 3.0(L)  Hemoglobin  13.0 - 17.0 g/dL 10.1(L) 9.0(L) 8.8(L)  Hematocrit 39.0 - 52.0 % 31.1(L) 27.3(L) 26.5(L)  Platelets 150 - 400 K/uL 602(H) 151 118(L)    . CMP Latest Ref Rng 10/02/2015 09/20/2015 09/19/2015  Glucose 65 - 99 mg/dL 86 105(H) 98  BUN 6 - 20 mg/dL _0 Creatinine 0.61 - 1.24 mg/dL 0.74 0.61 0.70  Sodium 135 - 145 mmol/L 140 138 136  Potassium 3.5 - 5.1 mmol/L 4.2 3.4(L) 4.1  Chloride 101 - 111 mmol/L 108 106 104  CO2 22 - 32 mmol/L _1 Calcium 8.9 - 10.3 mg/dL 9.3 8.1(L) 8.4(L)  Total Protein 6.5 - 8.1 g/dL 6.5 - 5.7(L)  Total Bilirubin 0.3 - 1.2 mg/dL 0.7 - 0.8  Alkaline Phos 38 - 126 U/L 72 - 76  AST 15 - 41 U/L 30 - 26  ALT 17 - 63 U/L 29 - 24   . Lab Results  Component Value Date   LDH 354* 09/11/2015    RADIOGRAPHIC STUDIES: I have personally reviewed the radiological images as listed and agreed with the findings in the report. No results found.   PET/CT 09/29/2015: IMPRESSION: 1. Interval decrease in size and hypermetabolism of the left mesenteric  lymph node. The hypermetabolic inguinal lymph nodes show sent no substantial change in size in the interval with decreased FDG uptake in the right inguinal lymph node and slight interval increase in FDG accumulation within the left inguinal lymph node. 2. New areas of hypermetabolic lung consolidation in the right middle and lower lobes. This may be related to multifocal pneumonia or an inflammatory process with lymphoma considered less likely. 3. Interval surgery at the C5 level. There is some residual hypermetabolism in this region which may be postsurgical healing, but residual tumor not excluded. 4. New hypermetabolic soft tissue stranding in the subcutaneous fat overlying the lower sacrum. This would be atypical for lymphoma and infectious/inflammatory etiology is favored. 5. New small hypermetabolic focus in the posterior lateral right chest wall, adjacent to the tip of the scapula. Indeterminate finding. Attention on follow-up recommended.   Electronically Signed  By: Misty Stanley M.D.  On: 09/29/2015 12:33  [place] ext: (09/20/2015): Summary:  - Findings consistent with deep vein thrombosis involving the right  gastrocnemius vein and right soleal vein. - No evidence of Baker&'s cyst on the right. - There is a 3.5cm heterogenous area of the right groin, suggestive  of a possible enlarged inguinal lymph node.  ASSESSMENT & PLAN:   1) Diffuse large B-cell lymphoma stage IV AE with involvement of mesenteric, inguinal lymph nodes and C5 vertebral body with spinal cord impingement.  He had a C5 corpectomy on 07/26/2015 that showed diffuse large B-cell lymphoma with a Ki-67 ranging from 20 to 90%. Plan Patient received first cycle of R CHOP on 08/08/2015 and intrathecal methotrexate with hydrocortisone on 08/09/2015. Patient subsequently was admitted with ARDS likely due to neulasta -treated with high dose steroids and empirically with broad spectrum antibiotics.  2nd cycle of  chemotherapy delayed to allow for rehabilitation (was due on 08/31/2015).  Received R-mini-CHOP for cycle 2 and tolerated it without significant cytopenias. Was admitted briefly for a mild lower extremity cellulitis and DVT. Cellulitis now resolved. Patient completing his oral antibiotics.  Blood counts today stable. LDH from today pending. PET/CT scan on 09/29/2015 with no evidence of disease progression. Good response in his mesenteric lymph nodes. Inguinal lymph nodes on the right side. Decreased uptake in his C5 level.  No new lesions  noted.   His sacral decubitus ulcer is healing well and there is no overt sign of infection. Plan -labs stable today - we will proceed with cycle 3 of R- CHOP ( we will increase the doxorubicin dose back to 50 mg/m and keep the cyclophosphamide dose reduction at 400 mg/m] -Patient was counseled about this in great details and recommended strict infection prevention strategies. -no G-CSF/neulasta due to issues with ARDS -has had port placement this morning which was uneventful. -Awaiting LDH level -PET/CT prior to cycle 5 -if patient still has FDG avidity at C5 at that point might consider MRI of C-spine. -Maintaining good nutrition and physical activity. -Continue follow-up with wound care. -Weekly labs. Return to care in clinic in one week. -Will hold off intrathecal methotrexate CNS prophylaxis this cycle to monitor for counts and allow for decubitus ulcer healing to avoid introducing CNS infection. Also patient is on anticoagulation for his DVT which we would like to continue uninterrupted for at least 1 months prior to holding it for his LP. -we will probably plan to do it at day 14 after next cycle of chemotherapy if counts stable. -recommended using oral salt and bicarbonate mouthwash 4 times a day. -Incentive spirometry every 2 hours while awake to reduce risk of atelectasis related pneumonia. -Patient wondering about flu shot. We had a detailed  discussion regarding this. We noted that there is uncertainty regarding the efficacy of the shop in the setting of active chemotherapy and the risk of fever with a shot. If the patient decides to proceed the best timing would be to do it about a week before his next cycle of chemotherapy when his counts have bounced back.  2)Acid reflux and gastritis likely related to steroid use -controlled -Continue PPI  3) Decubitus Ulcer over sacral area -improving with optimized nutrition and increased ambulation. Continue follow-up with wound care next clinic visit on 10/06/2015.  4) Debility due to recent hospitalization -Much improved. Still requiring active physical therapy. 5) C5 surgery - now off c -collar.  6) s/ dysphagia - now resolved. Aspiration precautions 7) Insomnia/Depression 8) Significant muscle loss due to steroids/hospitalization/debility  -continue optimized nutrition and physical therapy -continue oxandrolone  9)right lower extremity distal DVT with mild cellulitis. Cellulitis resolved. Patient tolerating Elliquis. Plan -Continue 3 months of anticoagulation. Would hold if platelet counts less than 50,000 or if bleeding. -Would need to hold for 3 days prior to his LP next month for intrathecal methotrexate.  I spent 40 minutes counseling the patient face to face. The total time spent in the appointment was 45 minutes and more than 50% was on counseling and direct patient cares.   Sullivan Lone MD [place] Hematology/Oncology Physician [facility]  (Office):       [phone] (Work cell):  [phone] (Fax):           [phone]

## 2015-10-02 NOTE — Patient Outreach (Signed)
[facility]) Care Management  10/02/2015  Ethan Stewart [birth year] [id number]   Notification from Nat Christen, LCSW to assign Community RN due patient will discharge from [facility] on Wednesday, October 12th.  Assigned Audry Riles, RN to outreach for [place] Management services.  Thanks, Ronnell [place]. [place], [place] Assistant Phone: [phone] Fax: [phone]

## 2015-10-02 NOTE — Telephone Encounter (Signed)
Gave patient avs report and appointments for October  °

## 2015-10-02 NOTE — Patient Instructions (Signed)
[place] Discharge Instructions for Patients Receiving Chemotherapy  Today you received the following chemotherapy agents: adriamycin, vincristine, cytoxan, rituxan  To help prevent nausea and vomiting after your treatment, we encourage you to take your nausea medication.  Take it as often as prescribed.     If you develop nausea and vomiting that is not controlled by your nausea medication, call the clinic. If it is after clinic hours your family physician or the after hours number for the clinic or go to the Emergency Department.   BELOW ARE SYMPTOMS THAT SHOULD BE REPORTED IMMEDIATELY:  *FEVER GREATER THAN 100.5 F  *CHILLS WITH OR WITHOUT FEVER  NAUSEA AND VOMITING THAT IS NOT CONTROLLED WITH YOUR NAUSEA MEDICATION  *UNUSUAL SHORTNESS OF BREATH  *UNUSUAL BRUISING OR BLEEDING  TENDERNESS IN MOUTH AND THROAT WITH OR WITHOUT PRESENCE OF ULCERS  *URINARY PROBLEMS  *BOWEL PROBLEMS  UNUSUAL RASH Items with * indicate a potential emergency and should be followed up as soon as possible.  Feel free to call the clinic you have any questions or concerns. The clinic phone number is [phone].   I have been informed and understand all the instructions given to me. I know to contact the clinic, my physician, or go to the Emergency Department if any problems should occur. I do not have any questions at this time, but understand that I may call the clinic during office hours   should I have any questions or need assistance in obtaining follow up care.    __________________________________________  _____________  __________ Signature of Patient or Authorized Representative            Date                   Time    __________________________________________ Nurse's Signature

## 2015-10-02 NOTE — Discharge Instructions (Signed)
Moderate Conscious Sedation, Adult °Sedation is the use of medicines to promote relaxation and relieve discomfort and anxiety. Moderate conscious sedation is a type of sedation. Under moderate conscious sedation you are less alert than normal but are still able to respond to instructions or stimulation. Moderate conscious sedation is used during short medical and dental procedures. It is milder than deep sedation or general anesthesia and allows you to return to your regular activities sooner. °LET YOUR HEALTH CARE PROVIDER KNOW ABOUT:  °· Any allergies you have. °· All medicines you are taking, including vitamins, herbs, eye drops, creams, and over-the-counter medicines. °· Use of steroids (by mouth or creams). °· Previous problems you or members of your family have had with the use of anesthetics. °· Any blood disorders you have. °· Previous surgeries you have had. °· Medical conditions you have. °· Possibility of pregnancy, if this applies. °· Use of cigarettes, alcohol, or illegal drugs. °RISKS AND COMPLICATIONS °Generally, this is a safe procedure. However, as with any procedure, problems can occur. Possible problems include: °· Oversedation. °· Trouble breathing on your own. You may need to have a breathing tube until you are awake and breathing on your own. °· Allergic reaction to any of the medicines used for the procedure. °BEFORE THE PROCEDURE °· You may have blood tests done. These tests can help show how well your kidneys and liver are working. They can also show how well your blood clots. °· A physical exam will be done.   °· Only take medicines as directed by your health care provider. You may need to stop taking medicines (such as blood thinners, aspirin, or nonsteroidal anti-inflammatory drugs) before the procedure.   °· Do not eat or drink at least 6 hours before the procedure or as directed by your health care provider. °· Arrange for a responsible adult, family member, or friend to take you home  after the procedure. He or she should stay with you for at least 24 hours after the procedure, until the medicine has worn off. °PROCEDURE  °· An intravenous (IV) catheter will be inserted into one of your veins. Medicine will be able to flow directly into your body through this catheter. You may be given medicine through this tube to help prevent pain and help you relax. °· The medical or dental procedure will be done. °AFTER THE PROCEDURE °· You will stay in a recovery area until the medicine has worn off. Your blood pressure and pulse will be checked.   °·  Depending on the procedure you had, you may be allowed to go home when you can tolerate liquids and your pain is under control. °  °This information is not intended to replace advice given to you by your health care provider. Make sure you discuss any questions you have with your health care provider. °  °Document Released: 09/03/2001 Document Revised: 12/30/2014 Document Reviewed: 08/16/2013 °Elsevier Interactive Patient Education ©2016 Elsevier Inc. °Implanted Port Insertion, Care After °Refer to this sheet in the next few weeks. These instructions provide you with information on caring for yourself after your procedure. Your health care provider may also give you more specific instructions. Your treatment has been planned according to current medical practices, but problems sometimes occur. Call your health care provider if you have any problems or questions after your procedure. °WHAT TO EXPECT AFTER THE PROCEDURE °After your procedure, it is typical to have the following:  °· Discomfort at the port insertion site. Ice packs to the area will help. °· Bruising   on the skin over the port. This will subside in 3-4 days. °HOME CARE INSTRUCTIONS °· After your port is placed, you will get a manufacturer's information card. The card has information about your port. Keep this card with you at all times.   °· Know what kind of port you have. There are many types of  ports available.   °· Wear a medical alert bracelet in case of an emergency. This can help alert health care workers that you have a port.   °· The port can stay in for as long as your health care provider believes it is necessary.   °· A home health care nurse may give medicines and take care of the port.   °· You or a family member can get special training and directions for giving medicine and taking care of the port at home.   °SEEK MEDICAL CARE IF:  °· Your port does not flush or you are unable to get a blood return.   °· You have a fever or chills. °SEEK IMMEDIATE MEDICAL CARE IF: °· You have new fluid or pus coming from your incision.   °· You notice a bad smell coming from your incision site.   °· You have swelling, pain, or more redness at the incision or port site.   °· You have chest pain or shortness of breath. °  °This information is not intended to replace advice given to you by your health care provider. Make sure you discuss any questions you have with your health care provider. °  °Document Released: 09/29/2013 Document Revised: 12/14/2013 Document Reviewed: 09/29/2013 °Elsevier Interactive Patient Education ©2016 Elsevier Inc. °Implanted Port Home Guide °An implanted port is a type of central line that is placed under the skin. Central lines are used to provide IV access when treatment or nutrition needs to be given through a person's veins. Implanted ports are used for long-term IV access. An implanted port may be placed because:  °· You need IV medicine that would be irritating to the small veins in your hands or arms.   °· You need long-term IV medicines, such as antibiotics.   °· You need IV nutrition for a long period.   °· You need frequent blood draws for lab tests.   °· You need dialysis.   °Implanted ports are usually placed in the chest area, but they can also be placed in the upper arm, the abdomen, or the leg. An implanted port has two main parts:  °· Reservoir. The reservoir is round  and will appear as a small, raised area under your skin. The reservoir is the part where a needle is inserted to give medicines or draw blood.   °· Catheter. The catheter is a thin, flexible tube that extends from the reservoir. The catheter is placed into a large vein. Medicine that is inserted into the reservoir goes into the catheter and then into the vein.   °HOW WILL I CARE FOR MY INCISION SITE? °Do not get the incision site wet. Bathe or shower as directed by your health care provider.  °HOW IS MY PORT ACCESSED? °Special steps must be taken to access the port:  °· Before the port is accessed, a numbing cream can be placed on the skin. This helps numb the skin over the port site.   °· Your health care provider uses a sterile technique to access the port. °· Your health care provider must put on a mask and sterile gloves. °· The skin over your port is cleaned carefully with an antiseptic and allowed to dry. °· The port   is gently pinched between sterile gloves, and a needle is inserted into the port.  Only "non-coring" port needles should be used to access the port. Once the port is accessed, a blood return should be checked. This helps ensure that the port is in the vein and is not clogged.   If your port needs to remain accessed for a constant infusion, a clear (transparent) bandage will be placed over the needle site. The bandage and needle will need to be changed every week, or as directed by your health care provider.   Keep the bandage covering the needle clean and dry. Do not get it wet. Follow your health care provider's instructions on how to take a shower or bath while the port is accessed.   If your port does not need to stay accessed, no bandage is needed over the port.  WHAT IS FLUSHING? Flushing helps keep the port from getting clogged. Follow your health care provider's instructions on how and when to flush the port. Ports are usually flushed with saline solution or a medicine called  heparin. The need for flushing will depend on how the port is used.   If the port is used for intermittent medicines or blood draws, the port will need to be flushed:   After medicines have been given.   After blood has been drawn.   As part of routine maintenance.   If a constant infusion is running, the port may not need to be flushed.  HOW LONG WILL MY PORT STAY IMPLANTED? The port can stay in for as long as your health care provider thinks it is needed. When it is time for the port to come out, surgery will be done to remove it. The procedure is similar to the one performed when the port was put in.  WHEN SHOULD I SEEK IMMEDIATE MEDICAL CARE? When you have an implanted port, you should seek immediate medical care if:   You notice a bad smell coming from the incision site.   You have swelling, redness, or drainage at the incision site.   You have more swelling or pain at the port site or the surrounding area.   You have a fever that is not controlled with medicine.   This information is not intended to replace advice given to you by your health care provider. Make sure you discuss any questions you have with your health care provider.   Document Released: 12/09/2005 Document Revised: 09/29/2013 Document Reviewed: 08/16/2013 Elsevier Interactive Patient Education 2016 Elsevier Inc. Moderate Conscious Sedation, Adult, Care After Refer to this sheet in the next few weeks. These instructions provide you with information on caring for yourself after your procedure. Your health care provider may also give you more specific instructions. Your treatment has been planned according to current medical practices, but problems sometimes occur. Call your health care provider if you have any problems or questions after your procedure. WHAT TO EXPECT AFTER THE PROCEDURE  After your procedure:  You may feel sleepy, clumsy, and have poor balance for several hours.  Vomiting may occur if  you eat too soon after the procedure. HOME CARE INSTRUCTIONS  Do not participate in any activities where you could become injured for at least 24 hours. Do not:  Drive.  Swim.  Ride a bicycle.  Operate heavy machinery.  Cook.  Use power tools.  Climb ladders.  Work from a high place.  Do not make important decisions or sign legal documents until you are improved.  If you vomit, drink water, juice, or soup when you can drink without vomiting. Make sure you have little or no nausea before eating solid foods.  Only take over-the-counter or prescription medicines for pain, discomfort, or fever as directed by your health care provider.  Make sure you and your family fully understand everything about the medicines given to you, including what side effects may occur.  You should not drink alcohol, take sleeping pills, or take medicines that cause drowsiness for at least 24 hours.  If you smoke, do not smoke without supervision.  If you are feeling better, you may resume normal activities 24 hours after you were sedated.  Keep all appointments with your health care provider. SEEK MEDICAL CARE IF:  Your skin is pale or bluish in color.  You continue to feel nauseous or vomit.  Your pain is getting worse and is not helped by medicine.  You have bleeding or swelling.  You are still sleepy or feeling clumsy after 24 hours. SEEK IMMEDIATE MEDICAL CARE IF:  You develop a rash.  You have difficulty breathing.  You develop any type of allergic problem.  You have a fever. MAKE SURE YOU:  Understand these instructions.  Will watch your condition.  Will get help right away if you are not doing well or get worse.   This information is not intended to replace advice given to you by your health care provider. Make sure you discuss any questions you have with your health care provider.   Document Released: 09/29/2013 Document Revised: 12/30/2014 Document Reviewed:  09/29/2013 Elsevier Interactive Patient Education [employer].

## 2015-10-04 ENCOUNTER — Other Ambulatory Visit: Payer: Self-pay | Admitting: *Deleted

## 2015-10-04 NOTE — Patient Outreach (Signed)
[facility]) Care Management  10/04/2015  LEANTHONY RHETT December 09, 1946 [id number]  CSW was able to meet with patient and patient's wife, Britney Captain today at [place], [place] where patient currently resides to receive rehabilitative services, to assess and assist with discharge planning needs and services.  CSW is aware that patient was scheduled to discharge home today, intending to meet with patient and patient's team of therapists to ensure that patient has all the medical equipment and home care services he needs to be successful at home.  Patient and Mrs. Fullilove elected for patient to receive all durable medical equipment, as well as home care services (physical therapy, occupational therapy and nursing services) through [facility].  Patient and Mrs. Solanki were most appreciative of all services received through [place] with [facility], but reported no additional social work needs at present. CSW will perform a case closure on patient, as all goals of treatment have been met from social work standpoint and no additional social work needs have been identified at this time. CSW will notify patient's RNCM with [facility], Dannielle Huh of CSW's plans to close patient's case. CSW will fax a correspondence letter to patient's Primary Care Physician, Dr. Lona Kettle to ensure that Dr. Harrington Challenger is aware of CSW's involvement with patient. CSW will submit a case closure request to Lurline Del, Care Management Assistant with [facility], in the form of a message.  CSW will ensure that Mrs. Laurance Flatten is aware of Rod Can, RNCM with [facility], continued involvement with patient's care. Nat Christen, BSW, MSW, LCSW  Licensed Education officer, [facility]  Mailing [place] [address]  [place], [place] [address] Physical Address-[address], [place], [place] [address] Toll Free Main # [phone] Fax # [phone] Cell # [phone]  Fax # [phone]  [email or web address]

## 2015-10-05 ENCOUNTER — Other Ambulatory Visit: Payer: Self-pay | Admitting: *Deleted

## 2015-10-05 ENCOUNTER — Encounter: Payer: Self-pay | Admitting: *Deleted

## 2015-10-05 DIAGNOSIS — C8330 Diffuse large B-cell lymphoma, unspecified site: Secondary | ICD-10-CM

## 2015-10-05 MED ORDER — OXANDROLONE 2.5 MG PO TABS: 5.0000 mg | ORAL_TABLET | Freq: Two times a day (BID) | ORAL | Status: DC

## 2015-10-05 NOTE — Telephone Encounter (Signed)
Refilled Oxandrolone. Patient family member notified for pick up

## 2015-10-05 NOTE — Patient Outreach (Signed)
[facility]) Care Management  10/05/2015  Ethan Stewart [birth year] [id number]   Assessment: Transition of care call Referral received from care management assistant Ethan Stewart) due to patient being discharged from [facility] skilled nursing facility. Call placed to patient and was able to speak to both patient and wife. Care management coordinator introduced self and purpose of the call.  Patient's wife informed care management coordinator that they had spoken to another lady from [facility] (referring to social worker- Ethan Stewart) and she was told that they don't feel the need of getting involved with [facility] program at this time since they have opted to get services from [facility].  Explained to patient and wife that [facility] care management provides different services from that of home health and enumerated to them the services offered. Wife and patient stated not being interested with the services since "at this time what patient really needs is physical therapy and occupational therapy to get him stronger", as stated by wife. Wife verbalize " I think we are okay right now". Patient states, "we will keep you in mind if there's any further need along the way".  Encourage patient to call as necessary. Contact informations on [facility], care management coordinator and 24-hour nurse line provided.   Plan: Will close case. Will notify primary care provider of case closure   Ethan Stewart A. Taiven Greenley, BSN, RN-BC [place] [facility] Coordinator Cell: [phone]

## 2015-10-06 ENCOUNTER — Encounter (HOSPITAL_BASED_OUTPATIENT_CLINIC_OR_DEPARTMENT_OTHER): Payer: PPO | Attending: Internal Medicine

## 2015-10-06 DIAGNOSIS — L89150 Pressure ulcer of sacral region, unstageable: Secondary | ICD-10-CM | POA: Diagnosis present

## 2015-10-06 DIAGNOSIS — Z9221 Personal history of antineoplastic chemotherapy: Secondary | ICD-10-CM | POA: Diagnosis not present

## 2015-10-06 DIAGNOSIS — Z87891 Personal history of nicotine dependence: Secondary | ICD-10-CM | POA: Insufficient documentation

## 2015-10-06 DIAGNOSIS — M109 Gout, unspecified: Secondary | ICD-10-CM | POA: Diagnosis not present

## 2015-10-06 DIAGNOSIS — Z9849 Cataract extraction status, unspecified eye: Secondary | ICD-10-CM | POA: Insufficient documentation

## 2015-10-06 DIAGNOSIS — Z86718 Personal history of other venous thrombosis and embolism: Secondary | ICD-10-CM | POA: Insufficient documentation

## 2015-10-09 ENCOUNTER — Other Ambulatory Visit (HOSPITAL_BASED_OUTPATIENT_CLINIC_OR_DEPARTMENT_OTHER): Payer: PPO

## 2015-10-09 ENCOUNTER — Encounter: Payer: Self-pay | Admitting: Hematology

## 2015-10-09 ENCOUNTER — Other Ambulatory Visit: Payer: Self-pay | Admitting: *Deleted

## 2015-10-09 ENCOUNTER — Ambulatory Visit (HOSPITAL_BASED_OUTPATIENT_CLINIC_OR_DEPARTMENT_OTHER): Payer: PPO | Admitting: Hematology

## 2015-10-09 VITALS — BP 125/69 | HR 110 | Temp 98.1°F | Resp 18 | Ht 72.0 in | Wt 194.9 lb

## 2015-10-09 DIAGNOSIS — C8330 Diffuse large B-cell lymphoma, unspecified site: Secondary | ICD-10-CM

## 2015-10-09 DIAGNOSIS — M6250 Muscle wasting and atrophy, not elsewhere classified, unspecified site: Secondary | ICD-10-CM

## 2015-10-09 DIAGNOSIS — K219 Gastro-esophageal reflux disease without esophagitis: Secondary | ICD-10-CM

## 2015-10-09 DIAGNOSIS — C8338 Diffuse large B-cell lymphoma, lymph nodes of multiple sites: Secondary | ICD-10-CM

## 2015-10-09 DIAGNOSIS — G4700 Insomnia, unspecified: Secondary | ICD-10-CM

## 2015-10-09 DIAGNOSIS — F329 Major depressive disorder, single episode, unspecified: Secondary | ICD-10-CM

## 2015-10-09 DIAGNOSIS — E43 Unspecified severe protein-calorie malnutrition: Secondary | ICD-10-CM

## 2015-10-09 DIAGNOSIS — L89159 Pressure ulcer of sacral region, unspecified stage: Secondary | ICD-10-CM | POA: Diagnosis not present

## 2015-10-09 LAB — COMPREHENSIVE METABOLIC PANEL (CC13)
ALT: 38 U/L (ref 0–55)
ANION GAP: 9 meq/L (ref 3–11)
AST: 35 U/L — ABNORMAL HIGH (ref 5–34)
Albumin: 3 g/dL — ABNORMAL LOW (ref 3.5–5.0)
Alkaline Phosphatase: 76 U/L (ref 40–150)
BUN: 13 mg/dL (ref 7.0–26.0)
CHLORIDE: 108 meq/L (ref 98–109)
CO2: 25 meq/L (ref 22–29)
CREATININE: 0.7 mg/dL (ref 0.7–1.3)
Calcium: 9.4 mg/dL (ref 8.4–10.4)
EGFR: >90 mL/min/{1.73_m2} (ref >90)
Glucose: 100 mg/dl (ref 70–140)
Potassium: 4 mEq/L (ref 3.5–5.1)
SODIUM: 142 meq/L (ref 136–145)
Total Bilirubin: <0.3 mg/dL (ref 0.20–1.20)
Total Protein: 6.1 g/dL — ABNORMAL LOW (ref 6.4–8.3)

## 2015-10-09 LAB — CBC & DIFF AND RETIC
BASO%: 1.5 % (ref 0.0–2.0)
Basophils Absolute: 0.1 10*3/uL (ref 0.0–0.1)
EOS%: 1.3 % (ref 0.0–7.0)
Eosinophils Absolute: 0.1 10*3/uL (ref 0.0–0.5)
HCT: 28.3 % — ABNORMAL LOW (ref 38.4–49.9)
HGB: 9.1 g/dL — ABNORMAL LOW (ref 13.0–17.1)
Immature Retic Fract: 12 % — ABNORMAL HIGH (ref 3.00–10.60)
LYMPH%: 15.7 % (ref 14.0–49.0)
MCH: 27.2 pg (ref 27.2–33.4)
MCHC: 32.2 g/dL (ref 32.0–36.0)
MCV: 84.5 fL (ref 79.3–98.0)
MONO#: 0.1 10*3/uL (ref 0.1–0.9)
MONO%: 1.8 % (ref 0.0–14.0)
NEUT%: 79.7 % — AB (ref 39.0–75.0)
NEUTROS ABS: 3.2 10*3/uL (ref 1.5–6.5)
PLATELETS: 302 10*3/uL (ref 140–400)
RBC: 3.35 10*6/uL — AB (ref 4.20–5.82)
RDW: 15.9 % — ABNORMAL HIGH (ref 11.0–14.6)
RETIC CT ABS: 8.38 10*3/uL — AB (ref 34.80–93.90)
WBC: 4 10*3/uL (ref 4.0–10.3)
lymph#: 0.6 10*3/uL — ABNORMAL LOW (ref 0.9–3.3)

## 2015-10-09 LAB — LACTATE DEHYDROGENASE (CC13): LDH: 198 U/L (ref 125–245)

## 2015-10-09 MED ORDER — OMEPRAZOLE 40 MG PO CPDR: 40.0000 mg | DELAYED_RELEASE_CAPSULE | Freq: Every day | ORAL | Status: DC

## 2015-10-09 MED ORDER — MIRTAZAPINE 15 MG PO TABS: 15.0000 mg | ORAL_TABLET | Freq: Every day | ORAL | Status: DC

## 2015-10-09 NOTE — Patient Outreach (Signed)
[facility]) Care Management  10/09/2015  LYNK MARTI [birth year] [id number]   Notification from Dannielle Huh, RN to close case due to patient withdrew from program with [facility].  Thanks, Ronnell [place]. [place], [place] Assistant Phone: [phone] Fax: [phone]

## 2015-10-10 ENCOUNTER — Encounter: Payer: Self-pay | Admitting: *Deleted

## 2015-10-10 NOTE — Progress Notes (Signed)
Received message from Capri Raben (wife of patient) stating that they are waiting on insurance approval for oxandrin prescription.  Message given to Raquel in managed care, and message sent to patient via my chart to let them know it is being worked on.

## 2015-10-11 ENCOUNTER — Encounter: Payer: Self-pay | Admitting: Hematology

## 2015-10-11 NOTE — Progress Notes (Signed)
I faxed [employer] prior auth req for oxandrolone

## 2015-10-13 ENCOUNTER — Encounter: Payer: Self-pay | Admitting: Hematology

## 2015-10-13 DIAGNOSIS — M109 Gout, unspecified: Secondary | ICD-10-CM | POA: Diagnosis not present

## 2015-10-13 DIAGNOSIS — Z86718 Personal history of other venous thrombosis and embolism: Secondary | ICD-10-CM | POA: Diagnosis not present

## 2015-10-13 DIAGNOSIS — Z87891 Personal history of nicotine dependence: Secondary | ICD-10-CM | POA: Diagnosis not present

## 2015-10-13 DIAGNOSIS — L89150 Pressure ulcer of sacral region, unstageable: Secondary | ICD-10-CM | POA: Diagnosis not present

## 2015-10-13 NOTE — Progress Notes (Signed)
I called to let the wife know request still pending. More questions had to be answered via covermymeds. Man said patient nor wife were at home.

## 2015-10-13 NOTE — Progress Notes (Signed)
I had to refax [employer] req for oxandrolone req

## 2015-10-16 ENCOUNTER — Other Ambulatory Visit (HOSPITAL_BASED_OUTPATIENT_CLINIC_OR_DEPARTMENT_OTHER): Payer: PPO

## 2015-10-16 ENCOUNTER — Encounter: Payer: Self-pay | Admitting: Hematology

## 2015-10-16 ENCOUNTER — Telehealth: Payer: Self-pay | Admitting: *Deleted

## 2015-10-16 DIAGNOSIS — C8330 Diffuse large B-cell lymphoma, unspecified site: Secondary | ICD-10-CM

## 2015-10-16 DIAGNOSIS — C8338 Diffuse large B-cell lymphoma, lymph nodes of multiple sites: Secondary | ICD-10-CM

## 2015-10-16 LAB — CBC & DIFF AND RETIC
BASO%: 3 % — AB (ref 0.0–2.0)
BASOS ABS: 0.1 10*3/uL (ref 0.0–0.1)
EOS%: 1.5 % (ref 0.0–7.0)
Eosinophils Absolute: 0 10*3/uL (ref 0.0–0.5)
HCT: 31.4 % — ABNORMAL LOW (ref 38.4–49.9)
HGB: 10.1 g/dL — ABNORMAL LOW (ref 13.0–17.1)
Immature Retic Fract: 12.6 % — ABNORMAL HIGH (ref 3.00–10.60)
LYMPH#: 0.9 10*3/uL (ref 0.9–3.3)
LYMPH%: 42.8 % (ref 14.0–49.0)
MCH: 27.8 pg (ref 27.2–33.4)
MCHC: 32.2 g/dL (ref 32.0–36.0)
MCV: 86.5 fL (ref 79.3–98.0)
MONO#: 0.6 10*3/uL (ref 0.1–0.9)
MONO%: 31.8 % — ABNORMAL HIGH (ref 0.0–14.0)
NEUT%: 20.9 % — ABNORMAL LOW (ref 39.0–75.0)
NEUTROS ABS: 0.4 10*3/uL — AB (ref 1.5–6.5)
Platelets: 186 10*3/uL (ref 140–400)
RBC: 3.63 10*6/uL — AB (ref 4.20–5.82)
RDW: 18.4 % — AB (ref 11.0–14.6)
RETIC %: 4.03 % — AB (ref 0.80–1.80)
RETIC CT ABS: 146.29 10*3/uL — AB (ref 34.80–93.90)
WBC: 2 10*3/uL — AB (ref 4.0–10.3)

## 2015-10-16 LAB — HOLD TUBE, BLOOD BANK

## 2015-10-16 LAB — TECHNOLOGIST REVIEW

## 2015-10-16 NOTE — Progress Notes (Signed)
Per [employer] [facility]  Oxandrolone approved [id number]. I sent to medical records. I called and let lashonya know--Loren said to let them know

## 2015-10-16 NOTE — Telephone Encounter (Signed)
Patient ANC: 0.4. Informed patient of neutropenic precaution. Left voicemail that his oxandrin has been approved.

## 2015-10-20 NOTE — Progress Notes (Signed)
Ethan Stewart  HEMATOLOGY ONCOLOGY PROGRESS NOTE Date of service .10/09/2015   Patient Care Team: Ethan Kettle, MD as PCP - General (Family Medicine) Ethan Genera, MD as Consulting Physician (Hematology)  CC: follow for diffuse large B-cell lymphoma  Diagnosis: Diffuse large B-cell lymphoma Stage IVAE with extranodal involvement of C5 vertebra status post C5 corpectomy   Treatment:  -Status post 3 cycles of R CHOP (dose reductions for cycle 2-3)  -Cannot use G-CSF due to issues with severe G-CSF related ARDS after cycle 1  INTERVAL HISTORY:  Ethan Stewart is here for his scheduled follow-up one week after cycle 3 R CHOP. Notes that he is feeling well.  He is at home and is able to ambulate independently.  Sacral decubitus ulcer is gradually improving.  Good oral intake.  No fevers or chills.  Labs stable today. Counseled in detail about neutropenic precautions.right leg symptoms resolved - cellulitis resolved.  No issues of bleeding on Apixaban.   REVIEW OF SYSTEMS:   10 point review of systems was done and is negative except as noted above  I have reviewed the past medical history, past surgical history, social history and family history with the patient and they are unchanged from previous note.  ALLERGIES:  is allergic to neulasta.  MEDICATIONS:  Current Outpatient Prescriptions  Medication Sig Dispense Refill  . acetaminophen (TYLENOL) 325 MG tablet Take 2 tablets (650 mg total) by mouth every 6 (six) hours as needed for mild pain, moderate pain, fever or headache (or Fever >/= 101).    . Amino Acids-Protein Hydrolys (FEEDING SUPPLEMENT, PRO-STAT SUGAR FREE 64,) LIQD Take 30 mLs by mouth 2 (two) times daily with a meal.    . apixaban (ELIQUIS) 5 MG TABS tablet Take 1 tablet (5 mg total) by mouth 2 (two) times daily. 60 tablet 0  . collagenase (SANTYL) ointment Apply topically daily. 15 g 0  . Multiple Vitamin (MULTIVITAMIN WITH MINERALS) TABS tablet Take 1 tablet by mouth daily.      Ethan Stewart neomycin-bacitracin-polymyxin (NEOSPORIN) ointment Apply 1 application topically as needed for wound care (left foot blister).     . ondansetron (ZOFRAN) 8 MG tablet Take 1 tablet (8 mg total) by mouth 2 (two) times daily. Start the day after chemo for 3 days. Then as needed for nausea or vomiting. 30 tablet 1  . oxandrolone (OXANDRIN) 2.5 MG tablet Take 2 tablets (5 mg total) by mouth 2 (two) times daily. 60 tablet 0  . polyethylene glycol (MIRALAX / GLYCOLAX) packet Take 17 g by mouth daily.    Ethan Stewart PRESCRIPTION MEDICATION [facility] CHEMO    . prochlorperazine (COMPAZINE) 10 MG tablet Take 1 tablet (10 mg total) by mouth every 6 (six) hours as needed (Nausea or vomiting). 30 tablet 6  . senna-docusate (SENNA S) 8.6-50 MG per tablet Take 2 tablets by mouth at bedtime. 60 tablet 1  . temazepam (RESTORIL) 15 MG capsule Take 15 mg by mouth at bedtime and may repeat dose one time if needed.    . mirtazapine (REMERON) 15 MG tablet Take 1 tablet (15 mg total) by mouth at bedtime. Might increase to 52m po HS in 7 days if still having insomnia 60 tablet 1  . omeprazole (PRILOSEC) 40 MG capsule Take 1 capsule (40 mg total) by mouth daily. 30 capsule 2   No current facility-administered medications for this visit.    PHYSICAL EXAMINATION: ECOG PERFORMANCE STATUS: 1 - Symptomatic but completely ambulatory  Filed Vitals:   10/09/15 0908  BP:  125/69  Pulse: 110  Temp: 98.1 F (36.7 C)  Resp: 18   Filed Weights   10/09/15 0908  Weight: 194 lb 14.4 oz (88.406 kg)    GENERAL:alert, no distress and comfortable SKIN: no acute rashes. Sacral decubitus ulcer not examined today. EYES: normal, Conjunctiva are pink and non-injected, sclera clear OROPHARYNX:no exudate, no erythema and lips, buccal mucosa, and tongue normal  NECK: supple, thyroid normal size, non-tender, without nodularity LYMPH:no palpable left inguinal adenopathy. LUNGS: clear to auscultation and percussion with normal breathing  effort HEART: regular rate & rhythm and no murmurs and no lower extremity edema ABDOMEN:abdomen soft, non-tender and normal bowel sounds Musculoskeletal:no cyanosis of digits and no clubbing  NEURO: alert & oriented x 3 with fluent speech, 4-/5 weakness right upper extremity abduction  LABORATORY DATA:   . CBC Latest Ref Rng  10/09/2015 10/02/2015  WBC 4.0 - 10.3 10e3/uL  4.0 4.6  Hemoglobin 13.0 - 17.1 g/dL  9.1(L) 10.1(L)  Hematocrit 38.4 - 49.9 %  28.3(L) 31.1(L)  Platelets 140 - 400 10e3/uL  302 602(H)    . CMP Latest Ref Rng 10/09/2015 10/02/2015 09/20/2015  Glucose 70 - 140 mg/dl 100 86 105(H)  BUN 7.0 - 26.0 mg/dL 13.0 14 9  Creatinine 0.7 - 1.3 mg/dL 0.7 0.74 0.61  Sodium 136 - 145 mEq/L 142 140 138  Potassium 3.5 - 5.1 mEq/L 4.0 4.2 3.4(L)  Chloride 101 - 111 mmol/L - 108 106  CO2 22 - 29 mEq/L _0 Calcium 8.4 - 10.4 mg/dL 9.4 9.3 8.1(L)  Total Protein 6.4 - 8.3 g/dL 6.1(L) 6.5 -  Total Bilirubin 0.20 - 1.20 mg/dL <0.30 0.7 -  Alkaline Phos 40 - 150 U/L 76 72 -  AST 5 - 34 U/L 35(H) 30 -  ALT 0 - 55 U/L 38 29 -   . Lab Results  Component Value Date   LDH 198 10/09/2015    RADIOGRAPHIC STUDIES: I have personally reviewed the radiological images as listed and agreed with the findings in the report. No results found.   PET/CT 09/29/2015: IMPRESSION: 1. Interval decrease in size and hypermetabolism of the left mesenteric lymph node. The hypermetabolic inguinal lymph nodes show sent no substantial change in size in the interval with decreased FDG uptake in the right inguinal lymph node and slight interval increase in FDG accumulation within the left inguinal lymph node. 2. New areas of hypermetabolic lung consolidation in the right middle and lower lobes. This may be related to multifocal pneumonia or an inflammatory process with lymphoma considered less likely. 3. Interval surgery at the C5 level. There is some residual hypermetabolism in this region which  may be postsurgical healing, but residual tumor not excluded. 4. New hypermetabolic soft tissue stranding in the subcutaneous fat overlying the lower sacrum. This would be atypical for lymphoma and infectious/inflammatory etiology is favored. 5. New small hypermetabolic focus in the posterior lateral right chest wall, adjacent to the tip of the scapula. Indeterminate finding. Attention on follow-up recommended.   Electronically Signed  By: Misty Stanley M.D.  On: 09/29/2015 12:33  [place] ext: (09/20/2015): Summary:  - Findings consistent with deep vein thrombosis involving the right  gastrocnemius vein and right soleal vein. - No evidence of Baker&'s cyst on the right. - There is a 3.5cm heterogenous area of the right groin, suggestive  of a possible enlarged inguinal lymph node.  ASSESSMENT & PLAN:   1) Diffuse large B-cell lymphoma stage IV AE with involvement of mesenteric, inguinal  lymph nodes and C5 vertebral body with spinal cord impingement.  He had a C5 corpectomy on 07/26/2015 that showed diffuse large B-cell lymphoma with a Ki-67 ranging from 20 to 90%. Plan Patient received first cycle of R CHOP on 08/08/2015 and intrathecal methotrexate with hydrocortisone on 08/09/2015. Patient subsequently was admitted with ARDS likely due to neulasta -treated with high dose steroids and empirically with broad spectrum antibiotics.  2nd cycle of chemotherapy delayed to allow for rehabilitation (was due on 08/31/2015).  Received R-mini-CHOP for cycle 2 and tolerated it without significant cytopenias. Was admitted briefly for a mild lower extremity cellulitis and DVT. Cellulitis now resolved. Patient completing his oral antibiotics.  Blood counts today stable. LDH from today pending. PET/CT scan on 09/29/2015 with no evidence of disease progression. Good response in his mesenteric lymph nodes. Inguinal lymph nodes on the right side. Decreased uptake in his C5 level.  No new lesions noted.    Cycle 3 (101/0/2016)  of R- CHOP ( we increased the doxorubicin dose back to 50 mg/m and keep the cyclophosphamide dose reduction at 400 mg/m]  Plan  -Patient was counseled and recommended strict infection prevention strategies. -no G-CSF/neulasta due to issues with ARDS  LDH level -within normal limits -Labs stable.  Continue weekly labs.  Might need to hold anticoagulation if platelets below 75,000. -PET/CT prior to cycle 5 -if patient still has FDG avidity at C5 at that point might consider MRI of C-spine. -Maintaining good nutrition and physical activity. -Continue follow-up with wound care. - Return to care in clinic in 2 weeks. -Will hold off intrathecal methotrexate CNS prophylaxis this cycle to monitor for counts and allow for decubitus ulcer healing to avoid introducing CNS infection. Also patient is on anticoagulation for his DVT which we would like to continue uninterrupted for at least 1 months prior to holding it for his LP. -we will probably plan to do it at day 14 after next cycle of chemotherapy if counts stable. -recommended using oral salt and bicarbonate mouthwash 4 times a day. -Incentive spirometry every 2 hours while awake to reduce risk of atelectasis related pneumonia.  2)Acid reflux and gastritis likely related to steroid use -controlled -Continue PPI  3) Decubitus Ulcer over sacral area -improving with optimized nutrition and increased ambulation. Continue follow-up with wound care next clinic visit on 10/06/2015.  4) Debility due to recent hospitalization -Much improved. Still requiring active physical therapy. 5) C5 surgery - now off c -collar.  6) s/ dysphagia - now resolved. Aspiration precautions 7) Insomnia/Depression 8) Significant muscle loss due to steroids/hospitalization/debility  -continue optimized nutrition and physical therapy -continue oxandrolone  9)right lower extremity distal DVT with mild cellulitis. Cellulitis resolved. Patient  tolerating Elliquis. Plan -Continue 3 months of anticoagulation. Would hold if platelet counts less than 50,000 or if bleeding. -Would need to hold for 3 days prior to his LP next month for intrathecal methotrexate.  I spent 15 minutes counseling the patient face to face. The total time spent in the appointment was 20 minutes and more than 50% was on counseling and direct patient cares.   Sullivan Lone MD [place] Hematology/Oncology Physician [facility]  (Office):       [phone] (Work cell):  [phone] (Fax):           [phone]

## 2015-10-22 ENCOUNTER — Other Ambulatory Visit: Payer: Self-pay | Admitting: Hematology

## 2015-10-23 ENCOUNTER — Ambulatory Visit (HOSPITAL_BASED_OUTPATIENT_CLINIC_OR_DEPARTMENT_OTHER): Payer: PPO | Admitting: Hematology

## 2015-10-23 ENCOUNTER — Ambulatory Visit (HOSPITAL_BASED_OUTPATIENT_CLINIC_OR_DEPARTMENT_OTHER): Payer: PPO

## 2015-10-23 ENCOUNTER — Encounter: Payer: Self-pay | Admitting: Hematology

## 2015-10-23 ENCOUNTER — Other Ambulatory Visit (HOSPITAL_BASED_OUTPATIENT_CLINIC_OR_DEPARTMENT_OTHER): Payer: PPO

## 2015-10-23 ENCOUNTER — Telehealth: Payer: Self-pay | Admitting: Hematology

## 2015-10-23 ENCOUNTER — Telehealth: Payer: Self-pay | Admitting: Internal Medicine

## 2015-10-23 VITALS — BP 135/84 | HR 117 | Temp 98.2°F | Resp 18 | Ht 72.0 in | Wt 208.5 lb

## 2015-10-23 VITALS — BP 114/63 | HR 84 | Temp 98.5°F | Resp 18

## 2015-10-23 DIAGNOSIS — Z5112 Encounter for antineoplastic immunotherapy: Secondary | ICD-10-CM | POA: Diagnosis not present

## 2015-10-23 DIAGNOSIS — C8330 Diffuse large B-cell lymphoma, unspecified site: Secondary | ICD-10-CM

## 2015-10-23 DIAGNOSIS — C8338 Diffuse large B-cell lymphoma, lymph nodes of multiple sites: Secondary | ICD-10-CM

## 2015-10-23 DIAGNOSIS — M6250 Muscle wasting and atrophy, not elsewhere classified, unspecified site: Secondary | ICD-10-CM

## 2015-10-23 DIAGNOSIS — Z5111 Encounter for antineoplastic chemotherapy: Secondary | ICD-10-CM

## 2015-10-23 DIAGNOSIS — L89159 Pressure ulcer of sacral region, unspecified stage: Secondary | ICD-10-CM

## 2015-10-23 DIAGNOSIS — K219 Gastro-esophageal reflux disease without esophagitis: Secondary | ICD-10-CM

## 2015-10-23 DIAGNOSIS — G4700 Insomnia, unspecified: Secondary | ICD-10-CM

## 2015-10-23 DIAGNOSIS — F329 Major depressive disorder, single episode, unspecified: Secondary | ICD-10-CM

## 2015-10-23 DIAGNOSIS — E43 Unspecified severe protein-calorie malnutrition: Secondary | ICD-10-CM

## 2015-10-23 LAB — CBC & DIFF AND RETIC
BASO%: 1.8 % (ref 0.0–2.0)
Basophils Absolute: 0.1 10*3/uL (ref 0.0–0.1)
EOS%: 0.6 % (ref 0.0–7.0)
Eosinophils Absolute: 0 10*3/uL (ref 0.0–0.5)
HCT: 34.2 % — ABNORMAL LOW (ref 38.4–49.9)
HGB: 10.8 g/dL — ABNORMAL LOW (ref 13.0–17.1)
IMMATURE RETIC FRACT: 15.2 % — AB (ref 3.00–10.60)
LYMPH%: 28.6 % (ref 14.0–49.0)
MCH: 27.2 pg (ref 27.2–33.4)
MCHC: 31.6 g/dL — AB (ref 32.0–36.0)
MCV: 86.1 fL (ref 79.3–98.0)
MONO#: 1.1 10*3/uL — ABNORMAL HIGH (ref 0.1–0.9)
MONO%: 31.3 % — ABNORMAL HIGH (ref 0.0–14.0)
NEUT#: 1.3 10*3/uL — ABNORMAL LOW (ref 1.5–6.5)
NEUT%: 37.7 % — AB (ref 39.0–75.0)
PLATELETS: 308 10*3/uL (ref 140–400)
RBC: 3.97 10*6/uL — AB (ref 4.20–5.82)
RDW: 18 % — ABNORMAL HIGH (ref 11.0–14.6)
Retic %: 2.19 % — ABNORMAL HIGH (ref 0.80–1.80)
Retic Ct Abs: 86.94 10*3/uL (ref 34.80–93.90)
WBC: 3.4 10*3/uL — ABNORMAL LOW (ref 4.0–10.3)
lymph#: 1 10*3/uL (ref 0.9–3.3)

## 2015-10-23 MED ORDER — HEPARIN SOD (PORK) LOCK FLUSH 100 UNIT/ML IV SOLN
500.0000 [IU] | Freq: Once | INTRAVENOUS | Status: AC | PRN
  Administered 2015-10-23: 500 [IU]
  Filled 2015-10-23: qty 5

## 2015-10-23 MED ORDER — PREDNISONE 20 MG PO TABS: 60.0000 mg | ORAL_TABLET | Freq: Every day | ORAL | Status: DC

## 2015-10-23 MED ORDER — VINCRISTINE SULFATE CHEMO INJECTION 1 MG/ML
2.0000 mg | Freq: Once | INTRAVENOUS | Status: AC
  Administered 2015-10-23: 2 mg via INTRAVENOUS
  Filled 2015-10-23: qty 2

## 2015-10-23 MED ORDER — ACETAMINOPHEN 325 MG PO TABS
ORAL_TABLET | ORAL | Status: AC
  Filled 2015-10-23: qty 2

## 2015-10-23 MED ORDER — ACETAMINOPHEN 325 MG PO TABS
650.0000 mg | ORAL_TABLET | Freq: Once | ORAL | Status: AC
  Administered 2015-10-23: 650 mg via ORAL

## 2015-10-23 MED ORDER — SODIUM CHLORIDE 0.9 % IV SOLN
Freq: Once | INTRAVENOUS | Status: AC
  Administered 2015-10-23: 10:00:00 via INTRAVENOUS
  Filled 2015-10-23: qty 8

## 2015-10-23 MED ORDER — OXANDROLONE 2.5 MG PO TABS: 2.5000 mg | ORAL_TABLET | Freq: Two times a day (BID) | ORAL | Status: DC

## 2015-10-23 MED ORDER — DIPHENHYDRAMINE HCL 25 MG PO CAPS
ORAL_CAPSULE | ORAL | Status: AC
  Filled 2015-10-23: qty 2

## 2015-10-23 MED ORDER — DIPHENHYDRAMINE HCL 25 MG PO CAPS
50.0000 mg | ORAL_CAPSULE | Freq: Once | ORAL | Status: AC
  Administered 2015-10-23: 50 mg via ORAL

## 2015-10-23 MED ORDER — CYCLOPHOSPHAMIDE CHEMO INJECTION 1 GM
400.0000 mg/m2 | Freq: Once | INTRAMUSCULAR | Status: AC
  Administered 2015-10-23: 880 mg via INTRAVENOUS
  Filled 2015-10-23: qty 44

## 2015-10-23 MED ORDER — SODIUM CHLORIDE 0.9 % IJ SOLN
10.0000 mL | INTRAMUSCULAR | Status: DC | PRN
  Administered 2015-10-23: 10 mL
  Filled 2015-10-23: qty 10

## 2015-10-23 MED ORDER — RITUXIMAB CHEMO INJECTION 500 MG/50ML
375.0000 mg/m2 | Freq: Once | INTRAVENOUS | Status: AC
  Administered 2015-10-23: 800 mg via INTRAVENOUS
  Filled 2015-10-23: qty 80

## 2015-10-23 MED ORDER — SODIUM CHLORIDE 0.9 % IV SOLN
Freq: Once | INTRAVENOUS | Status: AC
  Administered 2015-10-23: 10:00:00 via INTRAVENOUS

## 2015-10-23 MED ORDER — DOXORUBICIN HCL CHEMO IV INJECTION 2 MG/ML
50.0000 mg/m2 | Freq: Once | INTRAVENOUS | Status: AC
  Administered 2015-10-23: 112 mg via INTRAVENOUS
  Filled 2015-10-23: qty 56

## 2015-10-23 NOTE — Progress Notes (Signed)
Ethan Stewart  HEMATOLOGY ONCOLOGY PROGRESS NOTE Date of service .10/23/2015   Patient Care Team: Ethan Kettle, MD as PCP - General (Family Medicine) Ethan Genera, MD as Consulting Physician (Hematology)  CC: follow for diffuse large B-cell lymphoma  Diagnosis: Diffuse large B-cell lymphoma Stage IVAE with extranodal involvement of C5 vertebra status post C5 corpectomy   Treatment:  -Status post 3 cycles of R CHOP (dose reductions for cycle 2-3)  -Cannot use G-CSF due to issues with severe G-CSF related ARDS after cycle 1  INTERVAL HISTORY:  Ethan Stewart is here for his scheduled follow-up prior to cycle 4 R CHOP. Notes that he is feeling well.  He notes that he has gained back most of his lost weight.  He has been getting up and ambulating independently. Continues to be on his anticoagulation without any concerns.  No fevers or chills.  Overall much more positive. In reviewing his blood counts and after talking to him we decided to go with the same dose of chemotherapy as his third cycle with somewhat dose reduce cyclophosphamide but full dose of anthracycline.  He continues to have enclosed a decubitus ulcer which is healing slowly.  Continues to followup in the wound care clinic. Requesting refill on the Oxandrin which we decided to continue at a reduced dose of 2.5 mg by mouth twice daily after discussing the pros and cons.  REVIEW OF SYSTEMS:   10 point review of systems was done and is negative except as noted above  I have reviewed the past medical history, past surgical history, social history and family history with the patient and they are unchanged from previous note.  ALLERGIES:  is allergic to neulasta.  MEDICATIONS:  Current Outpatient Prescriptions  Medication Sig Dispense Refill  . acetaminophen (TYLENOL) 325 MG tablet Take 2 tablets (650 mg total) by mouth every 6 (six) hours as needed for mild pain, moderate pain, fever or headache (or Fever >/= 101).    . Amino  Acids-Protein Hydrolys (FEEDING SUPPLEMENT, PRO-STAT SUGAR FREE 64,) LIQD Take 30 mLs by mouth 2 (two) times daily with a meal.    . apixaban (ELIQUIS) 5 MG TABS tablet Take 1 tablet (5 mg total) by mouth 2 (two) times daily. 60 tablet 0  . collagenase (SANTYL) ointment Apply topically daily. 15 g 0  . mirtazapine (REMERON) 15 MG tablet Take 1 tablet (15 mg total) by mouth at bedtime. Might increase to $RemoveBef'30mg'LGkhVjMVlS$  po HS in 7 days if still having insomnia 60 tablet 1  . Multiple Vitamin (MULTIVITAMIN WITH MINERALS) TABS tablet Take 1 tablet by mouth daily.    Ethan Stewart neomycin-bacitracin-polymyxin (NEOSPORIN) ointment Apply 1 application topically as needed for wound care (left foot blister).     Ethan Stewart omeprazole (PRILOSEC) 40 MG capsule Take 1 capsule (40 mg total) by mouth daily. 30 capsule 2  . ondansetron (ZOFRAN) 8 MG tablet Take 1 tablet (8 mg total) by mouth 2 (two) times daily. Start the day after chemo for 3 days. Then as needed for nausea or vomiting. 30 tablet 1  . oxandrolone (OXANDRIN) 2.5 MG tablet Take 1 tablet (2.5 mg total) by mouth 2 (two) times daily. 60 tablet 0  . polyethylene glycol (MIRALAX / GLYCOLAX) packet Take 17 g by mouth daily.    Ethan Stewart PRESCRIPTION MEDICATION [facility] CHEMO    . prochlorperazine (COMPAZINE) 10 MG tablet Take 1 tablet (10 mg total) by mouth every 6 (six) hours as needed (Nausea or vomiting). 30 tablet 6  . senna-docusate (SENNA  S) 8.6-50 MG per tablet Take 2 tablets by mouth at bedtime. 60 tablet 1  . temazepam (RESTORIL) 15 MG capsule Take 15 mg by mouth at bedtime and may repeat dose one time if needed.    . predniSONE (DELTASONE) 20 MG tablet Take 3 tablets (60 mg total) by mouth daily with breakfast. D2-5 after chemotherapy each cycle 50 tablet 0   No current facility-administered medications for this visit.    PHYSICAL EXAMINATION: ECOG PERFORMANCE STATUS: 1 - Symptomatic but completely ambulatory  Filed Vitals:   10/23/15 0831  BP: 135/84  Pulse: 117  Temp: 98.2 F  (36.8 C)  Resp: 18   Filed Weights   10/23/15 0831  Weight: 208 lb 8 oz (94.575 kg)    GENERAL:alert, no distress and comfortable SKIN: no acute rashes. Sacral decubitus ulcer not examined today. EYES: normal, Conjunctiva are pink and non-injected, sclera clear OROPHARYNX:no exudate, no erythema and lips, buccal mucosa, and tongue normal  NECK: supple, thyroid normal size, non-tender, without nodularity LYMPH:no palpable left inguinal adenopathy. LUNGS: clear to auscultation and percussion with normal breathing effort HEART: regular rate & rhythm and no murmurs and no lower extremity edema ABDOMEN:abdomen soft, non-tender and normal bowel sounds Musculoskeletal:no cyanosis of digits and no clubbing  NEURO: alert & oriented x 3 with fluent speech, 4-/5 weakness right upper extremity abduction  LABORATORY DATA:   .Ethan Stewart CBC Latest Ref Rng 10/23/2015 10/16/2015 10/09/2015  WBC 4.0 - 10.3 10e3/uL 3.4(L) 2.0(L) 4.0  Hemoglobin 13.0 - 17.1 g/dL 10.8(L) 10.1(L) 9.1(L)  Hematocrit 38.4 - 49.9 % 34.2(L) 31.4(L) 28.3(L)  Platelets 140 - 400 10e3/uL 308 186 302    CMP Latest Ref Rng 10/09/2015 10/02/2015 09/20/2015  Glucose 70 - 140 mg/dl 100 86 105(H)  BUN 7.0 - 26.0 mg/dL 13.0 14 9  Creatinine 0.7 - 1.3 mg/dL 0.7 0.74 0.61  Sodium 136 - 145 mEq/L 142 140 138  Potassium 3.5 - 5.1 mEq/L 4.0 4.2 3.4(L)  Chloride 101 - 111 mmol/L - 108 106  CO2 22 - 29 mEq/L _0 Calcium 8.4 - 10.4 mg/dL 9.4 9.3 8.1(L)  Total Protein 6.4 - 8.3 g/dL 6.1(L) 6.5 -  Total Bilirubin 0.20 - 1.20 mg/dL <0.30 0.7 -  Alkaline Phos 40 - 150 U/L 76 72 -  AST 5 - 34 U/L 35(H) 30 -  ALT 0 - 55 U/L 38 29 -   . Lab Results  Component Value Date   LDH 198 10/09/2015    RADIOGRAPHIC STUDIES: I have personally reviewed the radiological images as listed and agreed with the findings in the report. No results found.   PET/CT 09/29/2015: IMPRESSION: 1. Interval decrease in size and hypermetabolism of the  left mesenteric lymph node. The hypermetabolic inguinal lymph nodes show sent no substantial change in size in the interval with decreased FDG uptake in the right inguinal lymph node and slight interval increase in FDG accumulation within the left inguinal lymph node. 2. New areas of hypermetabolic lung consolidation in the right middle and lower lobes. This may be related to multifocal pneumonia or an inflammatory process with lymphoma considered less likely. 3. Interval surgery at the C5 level. There is some residual hypermetabolism in this region which may be postsurgical healing, but residual tumor not excluded. 4. New hypermetabolic soft tissue stranding in the subcutaneous fat overlying the lower sacrum. This would be atypical for lymphoma and infectious/inflammatory etiology is favored. 5. New small hypermetabolic focus in the posterior lateral right chest wall, adjacent  to the tip of the scapula. Indeterminate finding. Attention on follow-up recommended.   Electronically Signed  By: Misty Stanley M.D.  On: 09/29/2015 12:33  [place] ext: (09/20/2015): Summary:  - Findings consistent with deep vein thrombosis involving the right  gastrocnemius vein and right soleal vein. - No evidence of Baker&'s cyst on the right. - There is a 3.5cm heterogenous area of the right groin, suggestive  of a possible enlarged inguinal lymph node.  ASSESSMENT & PLAN:   1) Diffuse large B-cell lymphoma stage IV AE with involvement of mesenteric, inguinal lymph nodes and C5 vertebral body with spinal cord impingement.  He had a C5 corpectomy on 07/26/2015 that showed diffuse large B-cell lymphoma with a Ki-67 ranging from 20 to 90%. Plan Patient received first cycle of R CHOP on 08/08/2015 and intrathecal methotrexate with hydrocortisone on 08/09/2015. Patient subsequently was admitted with ARDS likely due to neulasta -treated with high dose steroids and empirically with broad spectrum  antibiotics.  2nd cycle of chemotherapy delayed to allow for rehabilitation (was due on 08/31/2015).  Received R-mini-CHOP for cycle 2 and tolerated it without significant cytopenias. Was admitted briefly for a mild lower extremity cellulitis and DVT. Cellulitis now resolved. Patient completing his oral antibiotics.  Blood counts today stable. LDH from today pending. PET/CT scan on 09/29/2015 with no evidence of disease progression. Good response in his mesenteric lymph nodes. Inguinal lymph nodes on the right side. Decreased uptake in his C5 level.  No new lesions noted.   Cycle 3 (101/0/2016)  of R- CHOP ( we increased the doxorubicin dose back to 50 mg/m and keep the cyclophosphamide dose reduction at 400 mg/m]  Plan -patient's labs stable.  He is appropriate to proceed with his fourth cycle of R CHOP today. -Patient was counseled and recommended strict infection prevention strategies. -no G-CSF/neulasta due to issues with ARDS  LDH level -within normal limits - Continue weekly labs.  Might need to hold anticoagulation if platelets below 75,000. -PET/CT prior to cycle 5 -if patient still has FDG avidity at C5 at that point might consider MRI of C-spine. -Maintaining good nutrition and physical activity. -Continue follow-up with wound care. - Return to care in clinic in 2 weeks. -Will hold off intrathecal methotrexate CNS prophylaxis this cycle to monitor for counts and allow for decubitus ulcer healing to avoid introducing CNS infection. Also patient is on anticoagulation for his DVT which we would need to hold for at least 3-4 days prior to his LP -we will probably plan to do it at day 14 after fifth cycle of chemotherapy if counts stable. -recommended using oral salt and bicarbonate mouthwash 4 times a day. -Incentive spirometry every 2 hours while awake to reduce risk of atelectasis related pneumonia.  2)Acid reflux and gastritis likely related to steroid use -controlled -Continue  PPI  3) Decubitus Ulcer over sacral area -improving with optimized nutrition and increased ambulation. Continue follow-up with wound care next clinic visit on 10/06/2015.  4) Debility due to recent hospitalization -Much improved. Still requiring active physical therapy. 5) C5 surgery - now off c -collar.  6) s/ dysphagia - now resolved. Aspiration precautions 7) Insomnia/Depression 8) Significant muscle loss due to steroids/hospitalization/debility-improving significantly.  Patient is starting to build some muscle mass back and has been gaining back some of his weight. Plan  -continue optimized nutrition and physical therapy -continue oxandrolone-refill given at a lower dose of 2.5 mg by mouth twice a day.  9)right lower extremity distal DVT with mild cellulitis.  Cellulitis resolved. Patient tolerating Elliquis. Plan -Continue 3 months of anticoagulation. Would hold if platelet counts less than 50,000 or if bleeding. -Would need to hold for 3 days prior to his LP next month for intrathecal methotrexate.  I spent 15 minutes counseling the patient face to face. The total time spent in the appointment was 20 minutes and more than 50% was on counseling and direct patient cares.   Sullivan Lone MD [place] Hematology/Oncology Physician [facility]  (Office):       [phone] (Work cell):  [phone] (Fax):           [phone]

## 2015-10-23 NOTE — Patient Instructions (Signed)
[place] Discharge Instructions for Patients Receiving Chemotherapy  Today you received the following chemotherapy agents: Adriamycin, Vincristine, Cytoxan, Rituxan   To help prevent nausea and vomiting after your treatment, we encourage you to take your nausea medication as directed.   If you develop nausea and vomiting that is not controlled by your nausea medication, call the clinic.   BELOW ARE SYMPTOMS THAT SHOULD BE REPORTED IMMEDIATELY:  *FEVER GREATER THAN 100.5 F  *CHILLS WITH OR WITHOUT FEVER  NAUSEA AND VOMITING THAT IS NOT CONTROLLED WITH YOUR NAUSEA MEDICATION  *UNUSUAL SHORTNESS OF BREATH  *UNUSUAL BRUISING OR BLEEDING  TENDERNESS IN MOUTH AND THROAT WITH OR WITHOUT PRESENCE OF ULCERS  *URINARY PROBLEMS  *BOWEL PROBLEMS  UNUSUAL RASH Items with * indicate a potential emergency and should be followed up as soon as possible.  Feel free to call the clinic you have any questions or concerns. The clinic phone number is [phone].  Please show the [place] at check-in to the Emergency Department and triage nurse.

## 2015-10-23 NOTE — Progress Notes (Signed)
Per Dr. Irene Limbo okay to treat with ANC of 1.3, Cmet from 10/09/15 and HR of 112. Blood return noted before, every 3cc and after Adriamycin and before and after Vincristine.

## 2015-10-23 NOTE — Progress Notes (Signed)
OK for infusion RN to treat based off last CMET

## 2015-10-23 NOTE — Telephone Encounter (Signed)
Appointments complete patient aware to get avs report and appointments for November inf area. [facility] will call re pet scan -patient aware.

## 2015-10-27 ENCOUNTER — Encounter (HOSPITAL_BASED_OUTPATIENT_CLINIC_OR_DEPARTMENT_OTHER): Payer: PPO | Attending: Internal Medicine

## 2015-10-27 DIAGNOSIS — M109 Gout, unspecified: Secondary | ICD-10-CM | POA: Diagnosis not present

## 2015-10-27 DIAGNOSIS — Z9221 Personal history of antineoplastic chemotherapy: Secondary | ICD-10-CM | POA: Insufficient documentation

## 2015-10-27 DIAGNOSIS — L89150 Pressure ulcer of sacral region, unstageable: Secondary | ICD-10-CM | POA: Diagnosis present

## 2015-10-27 DIAGNOSIS — Z86718 Personal history of other venous thrombosis and embolism: Secondary | ICD-10-CM | POA: Diagnosis not present

## 2015-10-30 ENCOUNTER — Ambulatory Visit (HOSPITAL_BASED_OUTPATIENT_CLINIC_OR_DEPARTMENT_OTHER): Payer: PPO | Admitting: Hematology

## 2015-10-30 ENCOUNTER — Other Ambulatory Visit (HOSPITAL_BASED_OUTPATIENT_CLINIC_OR_DEPARTMENT_OTHER): Payer: PPO

## 2015-10-30 ENCOUNTER — Encounter: Payer: Self-pay | Admitting: Hematology

## 2015-10-30 VITALS — BP 135/66 | HR 97 | Temp 98.2°F | Resp 20 | Ht 72.0 in | Wt 209.6 lb

## 2015-10-30 DIAGNOSIS — C8338 Diffuse large B-cell lymphoma, lymph nodes of multiple sites: Secondary | ICD-10-CM

## 2015-10-30 DIAGNOSIS — M7989 Other specified soft tissue disorders: Secondary | ICD-10-CM

## 2015-10-30 DIAGNOSIS — C8330 Diffuse large B-cell lymphoma, unspecified site: Secondary | ICD-10-CM

## 2015-10-30 LAB — CBC & DIFF AND RETIC
BASO%: 0.2 % (ref 0.0–2.0)
BASOS ABS: 0 10*3/uL (ref 0.0–0.1)
EOS ABS: 0.1 10*3/uL (ref 0.0–0.5)
EOS%: 0.8 % (ref 0.0–7.0)
HEMATOCRIT: 31.4 % — AB (ref 38.4–49.9)
HEMOGLOBIN: 10.3 g/dL — AB (ref 13.0–17.1)
Immature Retic Fract: 18 % — ABNORMAL HIGH (ref 3.00–10.60)
LYMPH#: 0.8 10*3/uL — AB (ref 0.9–3.3)
LYMPH%: 12 % — ABNORMAL LOW (ref 14.0–49.0)
MCH: 27.2 pg (ref 27.2–33.4)
MCHC: 32.8 g/dL (ref 32.0–36.0)
MCV: 83.1 fL (ref 79.3–98.0)
MONO#: 0.1 10*3/uL (ref 0.1–0.9)
MONO%: 1.5 % (ref 0.0–14.0)
NEUT%: 85.5 % — ABNORMAL HIGH (ref 39.0–75.0)
NEUTROS ABS: 5.6 10*3/uL (ref 1.5–6.5)
Platelets: 247 10*3/uL (ref 140–400)
RBC: 3.78 10*6/uL — ABNORMAL LOW (ref 4.20–5.82)
RDW: 17.4 % — AB (ref 11.0–14.6)
RETIC CT ABS: 9.07 10*3/uL — AB (ref 34.80–93.90)
WBC: 6.5 10*3/uL (ref 4.0–10.3)

## 2015-10-30 LAB — LACTATE DEHYDROGENASE (CC13): LDH: 171 U/L (ref 125–245)

## 2015-10-30 LAB — COMPREHENSIVE METABOLIC PANEL (CC13)
ALBUMIN: 3.2 g/dL — AB (ref 3.5–5.0)
ALT: 25 U/L (ref 0–55)
AST: 18 U/L (ref 5–34)
Alkaline Phosphatase: 47 U/L (ref 40–150)
Anion Gap: 8 mEq/L (ref 3–11)
BUN: 10.8 mg/dL (ref 7.0–26.0)
CALCIUM: 9.6 mg/dL (ref 8.4–10.4)
CHLORIDE: 109 meq/L (ref 98–109)
CO2: 24 mEq/L (ref 22–29)
CREATININE: 0.8 mg/dL (ref 0.7–1.3)
EGFR: >90 mL/min/{1.73_m2} (ref >90)
GLUCOSE: 77 mg/dL (ref 70–140)
POTASSIUM: 4.1 meq/L (ref 3.5–5.1)
SODIUM: 141 meq/L (ref 136–145)
Total Bilirubin: 0.32 mg/dL (ref 0.20–1.20)
Total Protein: 6 g/dL — ABNORMAL LOW (ref 6.4–8.3)

## 2015-11-10 ENCOUNTER — Ambulatory Visit (HOSPITAL_COMMUNITY)
Admission: RE | Admit: 2015-11-10 | Discharge: 2015-11-10 | Disposition: A | Discharge status: Home / Self Care | Payer: PPO | Source: Ambulatory Visit | Attending: Hematology | Admitting: Hematology

## 2015-11-10 DIAGNOSIS — Z9221 Personal history of antineoplastic chemotherapy: Secondary | ICD-10-CM | POA: Diagnosis not present

## 2015-11-10 DIAGNOSIS — Z0189 Encounter for other specified special examinations: Secondary | ICD-10-CM | POA: Diagnosis present

## 2015-11-10 DIAGNOSIS — C8330 Diffuse large B-cell lymphoma, unspecified site: Secondary | ICD-10-CM

## 2015-11-10 DIAGNOSIS — M109 Gout, unspecified: Secondary | ICD-10-CM | POA: Diagnosis not present

## 2015-11-10 DIAGNOSIS — Z86718 Personal history of other venous thrombosis and embolism: Secondary | ICD-10-CM | POA: Diagnosis not present

## 2015-11-10 DIAGNOSIS — L89150 Pressure ulcer of sacral region, unstageable: Secondary | ICD-10-CM | POA: Diagnosis not present

## 2015-11-10 LAB — GLUCOSE, CAPILLARY: Glucose-Capillary: 90 mg/dL (ref 65–99)

## 2015-11-10 MED ORDER — FLUDEOXYGLUCOSE F - 18 (FDG) INJECTION
10.6400 | Freq: Once | INTRAVENOUS | Status: DC | PRN
  Administered 2015-11-10: 10.64 via INTRAVENOUS
  Filled 2015-11-10: qty 10.64

## 2015-11-13 ENCOUNTER — Other Ambulatory Visit (HOSPITAL_BASED_OUTPATIENT_CLINIC_OR_DEPARTMENT_OTHER): Payer: PPO

## 2015-11-13 ENCOUNTER — Telehealth: Payer: Self-pay | Admitting: *Deleted

## 2015-11-13 ENCOUNTER — Encounter: Payer: Self-pay | Admitting: Hematology

## 2015-11-13 ENCOUNTER — Ambulatory Visit (HOSPITAL_BASED_OUTPATIENT_CLINIC_OR_DEPARTMENT_OTHER): Payer: PPO | Admitting: Hematology

## 2015-11-13 ENCOUNTER — Ambulatory Visit (HOSPITAL_BASED_OUTPATIENT_CLINIC_OR_DEPARTMENT_OTHER): Payer: PPO

## 2015-11-13 ENCOUNTER — Telehealth: Payer: Self-pay | Admitting: Hematology

## 2015-11-13 VITALS — BP 137/70 | HR 87 | Temp 98.3°F | Resp 16

## 2015-11-13 VITALS — BP 155/88 | HR 86 | Temp 97.7°F | Resp 18 | Ht 72.0 in | Wt 216.2 lb

## 2015-11-13 DIAGNOSIS — C8338 Diffuse large B-cell lymphoma, lymph nodes of multiple sites: Secondary | ICD-10-CM

## 2015-11-13 DIAGNOSIS — C8330 Diffuse large B-cell lymphoma, unspecified site: Secondary | ICD-10-CM

## 2015-11-13 DIAGNOSIS — Z5112 Encounter for antineoplastic immunotherapy: Secondary | ICD-10-CM | POA: Diagnosis not present

## 2015-11-13 DIAGNOSIS — Z5111 Encounter for antineoplastic chemotherapy: Secondary | ICD-10-CM

## 2015-11-13 LAB — CBC & DIFF AND RETIC
BASO%: 2.4 % — AB (ref 0.0–2.0)
Basophils Absolute: 0.1 10*3/uL (ref 0.0–0.1)
EOS%: 1 % (ref 0.0–7.0)
Eosinophils Absolute: 0 10*3/uL (ref 0.0–0.5)
HCT: 35 % — ABNORMAL LOW (ref 38.4–49.9)
HGB: 11.2 g/dL — ABNORMAL LOW (ref 13.0–17.1)
IMMATURE RETIC FRACT: 10 % (ref 3.00–10.60)
LYMPH%: 32.4 % (ref 14.0–49.0)
MCH: 27.3 pg (ref 27.2–33.4)
MCHC: 32 g/dL (ref 32.0–36.0)
MCV: 85.2 fL (ref 79.3–98.0)
MONO#: 0.7 10*3/uL (ref 0.1–0.9)
MONO%: 23.3 % — ABNORMAL HIGH (ref 0.0–14.0)
NEUT%: 40.9 % (ref 39.0–75.0)
NEUTROS ABS: 1.2 10*3/uL — AB (ref 1.5–6.5)
PLATELETS: 231 10*3/uL (ref 140–400)
RBC: 4.11 10*6/uL — AB (ref 4.20–5.82)
RDW: 18 % — ABNORMAL HIGH (ref 11.0–14.6)
Retic %: 1.85 % — ABNORMAL HIGH (ref 0.80–1.80)
Retic Ct Abs: 76.04 10*3/uL (ref 34.80–93.90)
WBC: 3 10*3/uL — AB (ref 4.0–10.3)
lymph#: 1 10*3/uL (ref 0.9–3.3)

## 2015-11-13 LAB — COMPREHENSIVE METABOLIC PANEL (CC13)
ALK PHOS: 57 U/L (ref 40–150)
ALT: 26 U/L (ref 0–55)
ANION GAP: 9 meq/L (ref 3–11)
AST: 30 U/L (ref 5–34)
Albumin: 3.3 g/dL — ABNORMAL LOW (ref 3.5–5.0)
BUN: 7.9 mg/dL (ref 7.0–26.0)
CHLORIDE: 112 meq/L — AB (ref 98–109)
CO2: 23 meq/L (ref 22–29)
Calcium: 9.3 mg/dL (ref 8.4–10.4)
Creatinine: 0.9 mg/dL (ref 0.7–1.3)
EGFR: 83 mL/min/{1.73_m2} — AB (ref >90)
GLUCOSE: 101 mg/dL (ref 70–140)
POTASSIUM: 4 meq/L (ref 3.5–5.1)
SODIUM: 144 meq/L (ref 136–145)
TOTAL PROTEIN: 5.9 g/dL — AB (ref 6.4–8.3)

## 2015-11-13 LAB — LACTATE DEHYDROGENASE (CC13): LDH: 204 U/L (ref 125–245)

## 2015-11-13 MED ORDER — DIPHENHYDRAMINE HCL 25 MG PO CAPS
50.0000 mg | ORAL_CAPSULE | Freq: Once | ORAL | Status: AC
Start: 2015-11-13 — End: 2015-11-13
  Administered 2015-11-13: 50 mg via ORAL

## 2015-11-13 MED ORDER — DIPHENHYDRAMINE HCL 25 MG PO CAPS
ORAL_CAPSULE | ORAL | Status: AC
  Filled 2015-11-13: qty 2

## 2015-11-13 MED ORDER — HEPARIN SOD (PORK) LOCK FLUSH 100 UNIT/ML IV SOLN
500.0000 [IU] | Freq: Once | INTRAVENOUS | Status: AC | PRN
  Administered 2015-11-13: 500 [IU]
  Filled 2015-11-13: qty 5

## 2015-11-13 MED ORDER — ACETAMINOPHEN 325 MG PO TABS
650.0000 mg | ORAL_TABLET | Freq: Once | ORAL | Status: AC
  Administered 2015-11-13: 650 mg via ORAL

## 2015-11-13 MED ORDER — ACETAMINOPHEN 325 MG PO TABS
ORAL_TABLET | ORAL | Status: AC
  Filled 2015-11-13: qty 2

## 2015-11-13 MED ORDER — PREDNISONE 20 MG PO TABS: 60.0000 mg | ORAL_TABLET | Freq: Every day | ORAL | Status: DC

## 2015-11-13 MED ORDER — SODIUM CHLORIDE 0.9 % IJ SOLN
10.0000 mL | INTRAMUSCULAR | Status: DC | PRN
  Administered 2015-11-13: 10 mL
  Filled 2015-11-13: qty 10

## 2015-11-13 MED ORDER — SODIUM CHLORIDE 0.9 % IV SOLN
Freq: Once | INTRAVENOUS | Status: AC
  Administered 2015-11-13: 10:00:00 via INTRAVENOUS

## 2015-11-13 MED ORDER — VINCRISTINE SULFATE CHEMO INJECTION 1 MG/ML
2.0000 mg | Freq: Once | INTRAVENOUS | Status: AC
  Administered 2015-11-13: 2 mg via INTRAVENOUS
  Filled 2015-11-13: qty 2

## 2015-11-13 MED ORDER — DOXORUBICIN HCL CHEMO IV INJECTION 2 MG/ML
50.0000 mg/m2 | Freq: Once | INTRAVENOUS | Status: AC
  Administered 2015-11-13: 112 mg via INTRAVENOUS
  Filled 2015-11-13: qty 56

## 2015-11-13 MED ORDER — SODIUM CHLORIDE 0.9 % IV SOLN
Freq: Once | INTRAVENOUS | Status: AC
  Administered 2015-11-13: 11:00:00 via INTRAVENOUS
  Filled 2015-11-13: qty 8

## 2015-11-13 MED ORDER — SODIUM CHLORIDE 0.9 % IV SOLN
750.0000 mg/m2 | Freq: Once | INTRAVENOUS | Status: AC
  Administered 2015-11-13: 1660 mg via INTRAVENOUS
  Filled 2015-11-13: qty 83

## 2015-11-13 MED ORDER — SODIUM CHLORIDE 0.9 % IV SOLN
375.0000 mg/m2 | Freq: Once | INTRAVENOUS | Status: AC
  Administered 2015-11-13: 800 mg via INTRAVENOUS
  Filled 2015-11-13: qty 80

## 2015-11-13 NOTE — Patient Instructions (Signed)
[place] Discharge Instructions for Patients Receiving Chemotherapy  Today you received the following chemotherapy agents: Rituxan, Adriamycin, vincristine, Cytoxan.  To help prevent nausea and vomiting after your treatment, we encourage you to take your nausea medication:Compazine 10 mg every 6 hours as needed, Zofran 8 mg every 12 hours as needed.   If you develop nausea and vomiting that is not controlled by your nausea medication, call the clinic.   BELOW ARE SYMPTOMS THAT SHOULD BE REPORTED IMMEDIATELY:  *FEVER GREATER THAN 100.5 F  *CHILLS WITH OR WITHOUT FEVER  NAUSEA AND VOMITING THAT IS NOT CONTROLLED WITH YOUR NAUSEA MEDICATION  *UNUSUAL SHORTNESS OF BREATH  *UNUSUAL BRUISING OR BLEEDING  TENDERNESS IN MOUTH AND THROAT WITH OR WITHOUT PRESENCE OF ULCERS  *URINARY PROBLEMS  *BOWEL PROBLEMS  UNUSUAL RASH Items with * indicate a potential emergency and should be followed up as soon as possible.  Feel free to call the clinic you have any questions or concerns. The clinic phone number is [phone].  Please show the [place] at check-in to the Emergency Department and triage nurse.

## 2015-11-13 NOTE — Progress Notes (Signed)
Okay to treat patient with CBC/CMET. Cytoxan dosage change per MD Irene Limbo.

## 2015-11-13 NOTE — Telephone Encounter (Signed)
pe pof to schpt appt-sent MW email to sch trmt-pt aware-gave pcopy of avs

## 2015-11-13 NOTE — Telephone Encounter (Signed)
Per staff message and POF I have scheduled appts. Advised scheduler of appts. JMW  

## 2015-11-15 NOTE — Progress Notes (Signed)
[name] Kitchen  HEMATOLOGY ONCOLOGY PROGRESS NOTE Date of service .10/30/2015   Patient Care Team: Lona Kettle, MD as PCP - General (Family Medicine) Brunetta Genera, MD as Consulting Physician (Hematology)  CC: follow for diffuse large B-cell lymphoma  Diagnosis: Diffuse large B-cell lymphoma Stage IVAE with extranodal involvement of C5 vertebra status post C5 corpectomy   Treatment:  -Status post 4 cycles of R CHOP (dose reductions for cycle 2-3)  -Cannot use G-CSF due to issues with severe G-CSF related ARDS after cycle 1  INTERVAL HISTORY:  Ethan Stewart is here for his scheduled follow-up  one week after cycle 4 R-CHOP. Notes his energy levels and appetite have been improving. He has been finding home physical therapy quite effective. Has been gaining back his weight. No fevers or chills. Leg swelling is resolved.  REVIEW OF SYSTEMS:   10 point review of systems was done and is negative except as noted above  I have reviewed the past medical history, past surgical history, social history and family history with the patient and they are unchanged from previous note.  ALLERGIES:  is allergic to neulasta.  MEDICATIONS:  Current Outpatient Prescriptions  Medication Sig Dispense Refill  . acetaminophen (TYLENOL) 325 MG tablet Take 2 tablets (650 mg total) by mouth every 6 (six) hours as needed for mild pain, moderate pain, fever or headache (or Fever >/= 101).    . Amino Acids-Protein Hydrolys (FEEDING SUPPLEMENT, PRO-STAT SUGAR FREE 64,) LIQD Take 30 mLs by mouth 2 (two) times daily with a meal.    . apixaban (ELIQUIS) 5 MG TABS tablet Take 1 tablet (5 mg total) by mouth 2 (two) times daily. 60 tablet 0  . collagenase (SANTYL) ointment Apply topically daily. 15 g 0  . mirtazapine (REMERON) 15 MG tablet Take 1 tablet (15 mg total) by mouth at bedtime. Might increase to 65m po HS in 7 days if still having insomnia 60 tablet 1  . Multiple Vitamin (MULTIVITAMIN WITH MINERALS) TABS tablet Take  1 tablet by mouth daily.    .[name] Kitchenneomycin-bacitracin-polymyxin (NEOSPORIN) ointment Apply 1 application topically as needed for wound care (left foot blister).     .[name] Kitchenomeprazole (PRILOSEC) 40 MG capsule Take 1 capsule (40 mg total) by mouth daily. 30 capsule 2  . ondansetron (ZOFRAN) 8 MG tablet Take 1 tablet (8 mg total) by mouth 2 (two) times daily. Start the day after chemo for 3 days. Then as needed for nausea or vomiting. 30 tablet 1  . oxandrolone (OXANDRIN) 2.5 MG tablet Take 1 tablet (2.5 mg total) by mouth 2 (two) times daily. 60 tablet 0  . polyethylene glycol (MIRALAX / GLYCOLAX) packet Take 17 g by mouth daily.    . predniSONE (DELTASONE) 20 MG tablet Take 3 tablets (60 mg total) by mouth daily with breakfast. D2-5 after chemotherapy each cycle 50 tablet 0  . PRESCRIPTION MEDICATION [facility] CHEMO    . prochlorperazine (COMPAZINE) 10 MG tablet Take 1 tablet (10 mg total) by mouth every 6 (six) hours as needed (Nausea or vomiting). 30 tablet 6  . senna-docusate (SENNA S) 8.6-50 MG per tablet Take 2 tablets by mouth at bedtime. 60 tablet 1  . temazepam (RESTORIL) 15 MG capsule Take 15 mg by mouth at bedtime and may repeat dose one time if needed.     No current facility-administered medications for this visit.   Facility-Administered Medications Ordered in Other Visits  Medication Dose Route Frequency Provider Last Rate Last Dose  . fludeoxyglucose F -  18 (FDG) injection 10.64 milli Curie  10.64 milli Curie Intravenous Once PRN Brunetta Genera, MD   10.64 milli Curie at 11/10/15 0700    PHYSICAL EXAMINATION: ECOG PERFORMANCE STATUS: 1 - Symptomatic but completely ambulatory  Filed Vitals:   10/30/15 0910  BP: 135/66  Pulse: 97  Temp: 98.2 F (36.8 C)  Resp: 20   Filed Weights   10/30/15 0910  Weight: 209 lb 9.6 oz (95.074 kg)    GENERAL:alert, no distress and comfortable SKIN: no acute rashes. Sacral decubitus ulcer not examined today -patient following wound care and notes  that it is improving. EYES: normal, Conjunctiva are pink and non-injected, sclera clear OROPHARYNX:no exudate, no erythema and lips, buccal mucosa, and tongue normal  NECK: supple, thyroid normal size, non-tender, without nodularity LYMPH:no palpable cervical, axillary or inguinal lymphadenopathy . LUNGS: clear to auscultation and percussion with normal breathing effort HEART: regular rate & rhythm and no murmurs and no lower extremity edema ABDOMEN:abdomen soft, non-tender and normal bowel sounds Musculoskeletal:no cyanosis of digits and no clubbing  NEURO: alert & oriented x 3 with fluent speech, 4-/5 weakness right upper extremity abduction.  LABORATORY DATA:   .[name] Kitchen CBC Latest Ref Rng 10/30/2015 10/23/2015  WBC 4.0 - 10.3 10e3/uL 6.5 3.4(L)  Hemoglobin 13.0 - 17.1 g/dL 10.3(L) 10.8(L)  Hematocrit 38.4 - 49.9 % 31.4(L) 34.2(L)  Platelets 140 - 400 10e3/uL 247 308    CMP Latest Ref Rng 10/30/2015 10/09/2015  Glucose 70 - 140 mg/dl 77 100  BUN 7.0 - 26.0 mg/dL 10.8 13.0  Creatinine 0.7 - 1.3 mg/dL 0.8 0.7  Sodium 136 - 145 mEq/L 141 142  Potassium 3.5 - 5.1 mEq/L 4.1 4.0  Chloride 101 - 111 mmol/L - -  CO2 22 - 29 mEq/L 24 25  Calcium 8.4 - 10.4 mg/dL 9.6 9.4  Total Protein 6.4 - 8.3 g/dL 6.0(L) 6.1(L)  Total Bilirubin 0.20 - 1.20 mg/dL 0.32 <0.30  Alkaline Phos 40 - 150 U/L 47 76  AST 5 - 34 U/L 18 35(H)  ALT 0 - 55 U/L 25 38   . Lab Results  Component Value Date   LDH 204 11/13/2015   RADIOGRAPHIC STUDIES: I have personally reviewed the radiological images as listed and agreed with the findings in the report. No results found.   Cytogenetics   ASSESSMENT & PLAN:   1) Diffuse large B-cell lymphoma stage IV AE with involvement of mesenteric, inguinal lymph nodes and C5 vertebral body with spinal cord impingement.  He had a C5 corpectomy on 07/26/2015 that showed diffuse large B-cell lymphoma with a Ki-67 ranging from 20 to 90%. Cytogenetics and Fish showed BCL 2  positivity but negative for BCL 6 and cMYC CD10 positivity suggestive of possibly GCB subtype. Plan Patient received first cycle of R CHOP on 08/08/2015 and intrathecal methotrexate with hydrocortisone on 08/09/2015. Patient subsequently was admitted with ARDS likely due to neulasta -treated with high dose steroids and empirically with broad spectrum antibiotics.  2nd cycle of chemotherapy delayed to allow for rehabilitation (was due on 08/31/2015).  Received R-mini-CHOP for cycle 2 and tolerated it without significant cytopenias. Was admitted briefly for a mild lower extremity cellulitis and DVT. Cellulitis now resolved. Patient completing his oral antibiotics.  PET/CT scan on 09/29/2015 with no evidence of disease progression. Good response in his mesenteric lymph nodes. Inguinal lymph nodes on the right side. Decreased uptake in his C5 level.  No new lesions noted.   Cycle 3 (10/02/2015)  of R- CHOP (  we increased the doxorubicin dose back to 50 mg/m and keep the cyclophosphamide dose reduction at 400 mg/m]  Cycle 4 (10/23/2015) of R-CHOP ( we increased the doxorubicin dose back to 50 mg/m and kept the cyclophosphamide dose reduction at 400 mg/m].  Plan -Patient labs are stable and he reports no acute new toxicities. Working well with home physical therapy. -Patient was counseled and recommended strict infection prevention strategies. -no G-CSF/neulasta due to issues with ARDS -PET/CT prior to cycle 5 -Continue follow-up with wound care. - Return to care in clinic in 2wk,  prior to cycle 5 of chemotherapy.  -recommended using oral salt and bicarbonate mouthwash 4 times a day. -Incentive spirometry every 2 hours while awake to reduce risk of atelectasis related pneumonia.  2)Acid reflux and gastritis likely related to steroid use -controlled -Continue PPI  3) Decubitus Ulcer over sacral area -improving with optimized nutrition and increased ambulation. Continue follow-up with wound care  next clinic visit on 10/06/2015.  4) Debility due to recent hospitalization -Much improved. working with home physical therapy. Improving her upper extremity strength. 5) C5 surgery - now off c -collar.  6) s/ dysphagia - now resolved. Aspiration precautions 7) Insomnia/Depression 8) Significant muscle loss due to steroids/hospitalization/debility-improved significantly.  Patient is starting to build some muscle mass back and has been gaining back some of his weight. Plan  -continue optimized nutrition and physical therapy  9)right lower extremity distal DVT with mild cellulitis. Cellulitis resolved. Patient tolerating Elliquis. Plan -Continue 3 months of anticoagulation. Would hold if platelet counts less than 50,000 or if bleeding.  I spent 15 minutes counseling the patient face to face. The total time spent in the appointment was 20 minutes and more than 50% was on counseling and direct patient cares.   Ethan Lone MD [place] [facility]/Oncology Physician [facility]  (Office):       [phone] (Work cell):  [phone] (Fax):           [phone]

## 2015-11-15 NOTE — Progress Notes (Signed)
Ethan Stewart  HEMATOLOGY ONCOLOGY PROGRESS NOTE Date of service ..11/13/2015    Patient Care Team: Lona Kettle, MD as PCP - General (Family Medicine) Brunetta Genera, MD as Consulting Physician (Hematology)  CC: follow for diffuse large B-cell lymphoma  Diagnosis: Diffuse large B-cell lymphoma Stage IVAE with extranodal involvement of C5 vertebra status post C5 corpectomy   Treatment:  -Status post 4 cycles of R CHOP (dose reductions for cycle 2-3)  -Cannot use G-CSF due to issues with severe G-CSF related ARDS after cycle 1  INTERVAL HISTORY:  Ethan Stewart is here for his scheduled follow-up  prior to his fifth cycle of R CHOP chemotherapy. He is had a repeat PET/CT scan that shows interval improvement in his bilateral inguinal lymph nodes, decrease FDG uptake associated with the postsurgical changes involving the C-spine, improvement in the hypermetabolism of the subcutaneous soft tissues overlying the lower sacrum. However there is concern that he has developed a new 11 mm FDG avid lymph node with an SUV max of 11.75 in the small bowel mesentery that might be concerning for persistent/progressive disease. Patient's LDH levels are within normal limits. He has no new abdominal symptoms. Has continued to gain weight and has been eating well. Has graduated to outpatient physical therapy. He notes that his right upper extremity strength is much improved. No fevers/chills/night sweats/weight loss. No overt change in bowel habits. No issues with bleeding. Patient and wife somewhat upset with the findings of new abdominal lymph node which is quite understandable. We discussed in detail with these findings might mean. We decided to proceed with full dose R CHOP and repeat the PET scan prior to the next cycle to keep a close eye on these changes to determine further progression since that would require a change in treatment plan. We decided to hold off on intrathecal methotrexate at this time.  REVIEW OF  SYSTEMS:   10 point review of systems was done and is negative except as noted above  I have reviewed the past medical history, past surgical history, social history and family history with the patient and they are unchanged from previous note.  ALLERGIES:  is allergic to neulasta.  MEDICATIONS:  Current Outpatient Prescriptions  Medication Sig Dispense Refill  . acetaminophen (TYLENOL) 325 MG tablet Take 2 tablets (650 mg total) by mouth every 6 (six) hours as needed for mild pain, moderate pain, fever or headache (or Fever >/= 101).    . Amino Acids-Protein Hydrolys (FEEDING SUPPLEMENT, PRO-STAT SUGAR FREE 64,) LIQD Take 30 mLs by mouth 2 (two) times daily with a meal.    . apixaban (ELIQUIS) 5 MG TABS tablet Take 1 tablet (5 mg total) by mouth 2 (two) times daily. 60 tablet 0  . collagenase (SANTYL) ointment Apply topically daily. 15 g 0  . mirtazapine (REMERON) 15 MG tablet Take 1 tablet (15 mg total) by mouth at bedtime. Might increase to 61m po HS in 7 days if still having insomnia 60 tablet 1  . Multiple Vitamin (MULTIVITAMIN WITH MINERALS) TABS tablet Take 1 tablet by mouth daily.    .Ethan Kitchenneomycin-bacitracin-polymyxin (NEOSPORIN) ointment Apply 1 application topically as needed for wound care (left foot blister).     .Ethan Kitchenomeprazole (PRILOSEC) 40 MG capsule Take 1 capsule (40 mg total) by mouth daily. 30 capsule 2  . ondansetron (ZOFRAN) 8 MG tablet Take 1 tablet (8 mg total) by mouth 2 (two) times daily. Start the day after chemo for 3 days. Then as needed for nausea or  vomiting. 30 tablet 1  . oxandrolone (OXANDRIN) 2.5 MG tablet Take 1 tablet (2.5 mg total) by mouth 2 (two) times daily. 60 tablet 0  . polyethylene glycol (MIRALAX / GLYCOLAX) packet Take 17 g by mouth daily.    . predniSONE (DELTASONE) 20 MG tablet Take 3 tablets (60 mg total) by mouth daily with breakfast. D2-5 after chemotherapy each cycle 50 tablet 0  . PRESCRIPTION MEDICATION [facility] CHEMO    . prochlorperazine  (COMPAZINE) 10 MG tablet Take 1 tablet (10 mg total) by mouth every 6 (six) hours as needed (Nausea or vomiting). 30 tablet 6  . senna-docusate (SENNA S) 8.6-50 MG per tablet Take 2 tablets by mouth at bedtime. 60 tablet 1  . temazepam (RESTORIL) 15 MG capsule Take 15 mg by mouth at bedtime and may repeat dose one time if needed.     No current facility-administered medications for this visit.   Facility-Administered Medications Ordered in Other Visits  Medication Dose Route Frequency Provider Last Rate Last Dose  . fludeoxyglucose F - 18 (FDG) injection 10.64 milli Curie  10.64 milli Curie Intravenous Once PRN Brunetta Genera, MD   10.64 milli Curie at 11/10/15 0700    PHYSICAL EXAMINATION: ECOG PERFORMANCE STATUS: 1 - Symptomatic but completely ambulatory  Filed Vitals:   11/13/15 0903  BP: 155/88  Pulse: 86  Temp: 97.7 F (36.5 C)  Resp: 18   Filed Weights   11/13/15 0903  Weight: 216 lb 3.2 oz (98.068 kg)    GENERAL:alert, no distress and comfortable SKIN: no acute rashes. Sacral decubitus ulcer not examined today -patient following wound care and notes that it is improving. EYES: normal, Conjunctiva are pink and non-injected, sclera clear OROPHARYNX:no exudate, no erythema and lips, buccal mucosa, and tongue normal  NECK: supple, thyroid normal size, non-tender, without nodularity LYMPH:no palpable cervical, axillary or inguinal lymphadenopathy . LUNGS: clear to auscultation and percussion with normal breathing effort HEART: regular rate & rhythm and no murmurs and no lower extremity edema ABDOMEN:abdomen soft, non-tender and normal bowel sounds Musculoskeletal:no cyanosis of digits and no clubbing  NEURO: alert & oriented x 3 with fluent speech, 4-/5 weakness right upper extremity abduction-improving strength.  LABORATORY DATA:  . CBC Latest Ref Rng 11/13/2015 10/30/2015 10/23/2015  WBC 4.0 - 10.3 10e3/uL 3.0(L) 6.5 3.4(L)  Hemoglobin 13.0 - 17.1 g/dL 11.2(L)  10.3(L) 10.8(L)  Hematocrit 38.4 - 49.9 % 35.0(L) 31.4(L) 34.2(L)  Platelets 140 - 400 10e3/uL 231 247 308    . CMP Latest Ref Rng 11/13/2015 10/30/2015 10/09/2015  Glucose 70 - 140 mg/dl 101 77 100  BUN 7.0 - 26.0 mg/dL 7.9 10.8 13.0  Creatinine 0.7 - 1.3 mg/dL 0.9 0.8 0.7  Sodium 136 - 145 mEq/L 144 141 142  Potassium 3.5 - 5.1 mEq/L 4.0 4.1 4.0  Chloride 101 - 111 mmol/L - - -  CO2 22 - 29 mEq/L '23 24 25  ' Calcium 8.4 - 10.4 mg/dL 9.3 9.6 9.4  Total Protein 6.4 - 8.3 g/dL 5.9(L) 6.0(L) 6.1(L)  Total Bilirubin 0.20 - 1.20 mg/dL <0.30 0.32 <0.30  Alkaline Phos 40 - 150 U/L 57 47 76  AST 5 - 34 U/L 30 18 35(H)  ALT 0 - 55 U/L 26 25 38     . Lab Results  Component Value Date   LDH 204 11/13/2015   RADIOGRAPHIC STUDIES: I have personally reviewed the radiological images as listed and agreed with the findings in the report. No results found.   PET/CT 11/10/2015: NUCLEAR  MEDICINE PET SKULL BASE TO THIGH  TECHNIQUE: 10.6 mCi F-18 FDG was injected intravenously. Full-ring PET imaging was performed from the skull base to thigh after the radiotracer. CT data was obtained and used for attenuation correction and anatomic localization.  FASTING BLOOD GLUCOSE: Value: 90 mg/dl  COMPARISON: 09/29/2015  FINDINGS: NECK  No hypermetabolic lymph nodes in the neck.  CHEST  No hypermetabolic mediastinal or hilar nodes. No suspicious pulmonary nodules on the CT scan. Posterior right lower lobe subpleural density measures 3.2 cm and has an SUV max equal to 4.84. On the previous exam this measured 4.2 cm and had an SUV max equal to 5.03.  ABDOMEN/PELVIS  No abnormal hypermetabolic activity within the liver, pancreas, adrenal glands, or spleen. Soft tissue stranding is identified within the root of mesenteric. There is a hypermetabolic lymph node within this area which is new from previous exam measuring 11 mm. The SUV max is equal to 11.75. Also new from previous  exam is a focus of increased uptake within the small bowel mesenteric within SUV max of 3.79, image 175. Interval decrease in FDG uptake associated with the previous hypermetabolic left inguinal lymph node. SUV max is equal to 1.66 currently, image number 223. Previously 10.0. SUV max associated with index right inguinal lymph node is equal to 1.78. Previously 2.98. There has been interval improvement and FDG uptake overlying the  SKELETON  Hyper metabolism within the subcutaneous soft tissues overlying the lower sacrum has improved in the interval. SUV max is currently equal to 5.85. Previously 7.3. Decrease FDG uptake associated with postsurgical changes involving the cervical spine. SUV max currently 4.87. Previously 6.1. Increased FDG uptake between the L3 and L4 spinous process ease noted within SUV max equal to 3.8. Previously 4.16.  IMPRESSION: 1. Mixed interval response to therapy. 2. Interval improvement an FDG uptake associated with bilateral inguinal lymph nodes. 3. New foci of increased uptake within the small bowel mesenteric has an SUV max equal to 11.75. Suspicious for persistent or recurrent disease. 4. Persistent hypermetabolic subpleural density within the posterior right lower lobe. This demonstrates mild decrease in size in the interval but continues to exhibit malignant range FDG uptake. This may be postinflammatory or infectious. Pulmonary manifestation of lymphoma is less favored but not excluded.   Electronically Signed  By: Kerby Moors M.D.  On: 11/10/2015 09:38    ASSESSMENT & PLAN:   1) Diffuse large B-cell lymphoma stage IV AE with involvement of mesenteric, inguinal lymph nodes and C5 vertebral body with spinal cord impingement.  He had a C5 corpectomy on 07/26/2015 that showed diffuse large B-cell lymphoma with a Ki-67 ranging from 20 to 90%. Cytogenetics and Fish showed BCL 2 positivity but negative for BCL 6 and cMYC CD10 positivity  suggestive of possibly GCB subtype. Plan Patient received first cycle of R CHOP on 08/08/2015 and intrathecal methotrexate with hydrocortisone on 08/09/2015. Patient subsequently was admitted with ARDS likely due to neulasta -treated with high dose steroids and empirically with broad spectrum antibiotics.  2nd cycle of chemotherapy delayed to allow for rehabilitation (was due on 08/31/2015).  Received R-mini-CHOP for cycle 2 and tolerated it without significant cytopenias. Was admitted briefly for a mild lower extremity cellulitis and DVT. Cellulitis now resolved. Patient completing his oral antibiotics.  PET/CT scan on 09/29/2015 with no evidence of disease progression. Good response in his mesenteric lymph nodes. Inguinal lymph nodes on the right side. Decreased uptake in his C5 level.  No new lesions noted.   Cycle  3 (10/02/2015)  of R- CHOP ( we increased the doxorubicin dose back to 50 mg/m and keep the cyclophosphamide dose reduction at 400 mg/m]  Cycle 4 (10/23/2015) of R-CHOP ( we increased the doxorubicin dose back to 50 mg/m and kept the cyclophosphamide dose reduction at 400 mg/m].  PET/CT 11/10/2015: Shows improvement in most lesions but is noted to have a new FDG avid lymph node in the mesentery of the small bowel.  Plan -Patient labs are stable and he reports no acute new toxicities.  -Mild neutropenia but otherwise stable counts. -We will proceed with our CHOP cycle 5 at full dose. -Repeat PET CT scan prior to next cycle determine trend of his mesenteric lesions and other disease. If they are responding might decide to do 7-8 cycles of R CHOP at full dose. -If there is limited progression in 1 or 2 sites will consult radiation oncology for possible focal radiation therapy. -If that is generalized disease progression will need to determine options for second line therapy including possible evaluation for Autologous HSCT. -Patient was counseled and recommended strict infection  prevention strategies. -no G-CSF/neulasta due to issues with ARDS -PET/CT prior to cycle 6 -Continue follow-up with wound care. - Return to care in clinic with PET/CT prior to cycle 6 of R-CHOP -recommended using oral salt and bicarbonate mouthwash 4 times a day. -Incentive spirometry every 2 hours while awake to reduce risk of atelectasis related pneumonia.  2)Acid reflux and gastritis likely related to steroid use -controlled -Continue PPI  3) Decubitus Ulcer over sacral area -improving with optimized nutrition and increased ambulation. Continue follow-up with wound care next clinic visit on 10/06/2015.  4) Debility due to recent hospitalization -Much improved. working with home physical therapy. Improving her upper extremity strength. 5) C5 surgery - now off c -collar.  6) s/ dysphagia - now resolved. Aspiration precautions 7) Insomnia/Depression 8) Significant muscle loss due to steroids/hospitalization/debility-improved significantly.  Patient is starting to build some muscle mass back and has been gaining back some of his weight. Plan  -continue optimized nutrition and physical therapy  9)right lower extremity distal DVT with mild cellulitis. Cellulitis resolved. Patient tolerating Elliquis. Plan -Continue 3 months of anticoagulation. Would hold if platelet counts less than 50,000 or if bleeding.  I spent 20 minutes counseling the patient face to face. The total time spent in the appointment was 25 minutes and more than 50% was on counseling and direct patient cares for discussion of PET/CT findings, goals of care and treatment options.   Sullivan Lone MD [place] Hematology/Oncology Physician [facility]  (Office):       [phone] (Work cell):  [phone] (Fax):           [phone]

## 2015-11-24 ENCOUNTER — Encounter (HOSPITAL_BASED_OUTPATIENT_CLINIC_OR_DEPARTMENT_OTHER): Payer: PPO | Attending: Internal Medicine

## 2015-11-24 ENCOUNTER — Other Ambulatory Visit: Payer: Self-pay | Admitting: *Deleted

## 2015-11-24 DIAGNOSIS — Z9221 Personal history of antineoplastic chemotherapy: Secondary | ICD-10-CM | POA: Insufficient documentation

## 2015-11-24 DIAGNOSIS — L89153 Pressure ulcer of sacral region, stage 3: Secondary | ICD-10-CM | POA: Insufficient documentation

## 2015-11-24 DIAGNOSIS — L89150 Pressure ulcer of sacral region, unstageable: Secondary | ICD-10-CM | POA: Diagnosis present

## 2015-11-24 DIAGNOSIS — C8510 Unspecified B-cell lymphoma, unspecified site: Secondary | ICD-10-CM | POA: Insufficient documentation

## 2015-11-24 DIAGNOSIS — Z86718 Personal history of other venous thrombosis and embolism: Secondary | ICD-10-CM | POA: Diagnosis not present

## 2015-11-24 DIAGNOSIS — M109 Gout, unspecified: Secondary | ICD-10-CM | POA: Insufficient documentation

## 2015-11-28 ENCOUNTER — Encounter (HOSPITAL_COMMUNITY)
Admission: RE | Admit: 2015-11-28 | Discharge: 2015-11-28 | Disposition: A | Discharge status: Home / Self Care | Payer: PPO | Source: Ambulatory Visit | Attending: Hematology | Admitting: Hematology

## 2015-11-28 DIAGNOSIS — C8330 Diffuse large B-cell lymphoma, unspecified site: Secondary | ICD-10-CM | POA: Diagnosis not present

## 2015-11-28 LAB — GLUCOSE, CAPILLARY: GLUCOSE-CAPILLARY: 80 mg/dL (ref 65–99)

## 2015-11-28 MED ORDER — FLUDEOXYGLUCOSE F - 18 (FDG) INJECTION
11.5800 | Freq: Once | INTRAVENOUS | Status: AC | PRN
  Administered 2015-11-28: 11.58 via INTRAVENOUS

## 2015-11-29 ENCOUNTER — Ambulatory Visit (HOSPITAL_BASED_OUTPATIENT_CLINIC_OR_DEPARTMENT_OTHER): Payer: PPO | Admitting: Hematology

## 2015-11-29 ENCOUNTER — Telehealth: Payer: Self-pay | Admitting: Hematology

## 2015-11-29 ENCOUNTER — Other Ambulatory Visit (HOSPITAL_BASED_OUTPATIENT_CLINIC_OR_DEPARTMENT_OTHER): Payer: PPO

## 2015-11-29 ENCOUNTER — Encounter: Payer: Self-pay | Admitting: Hematology

## 2015-11-29 ENCOUNTER — Other Ambulatory Visit: Payer: Self-pay | Admitting: Hematology

## 2015-11-29 ENCOUNTER — Other Ambulatory Visit: Payer: Self-pay | Admitting: *Deleted

## 2015-11-29 VITALS — BP 159/90 | HR 97 | Temp 98.4°F | Resp 19 | Ht 72.0 in | Wt 219.4 lb

## 2015-11-29 DIAGNOSIS — C8330 Diffuse large B-cell lymphoma, unspecified site: Secondary | ICD-10-CM

## 2015-11-29 DIAGNOSIS — G4700 Insomnia, unspecified: Secondary | ICD-10-CM

## 2015-11-29 DIAGNOSIS — K2970 Gastritis, unspecified, without bleeding: Secondary | ICD-10-CM | POA: Diagnosis not present

## 2015-11-29 DIAGNOSIS — C8338 Diffuse large B-cell lymphoma, lymph nodes of multiple sites: Secondary | ICD-10-CM

## 2015-11-29 DIAGNOSIS — I824Z1 Acute embolism and thrombosis of unspecified deep veins of right distal lower extremity: Secondary | ICD-10-CM | POA: Diagnosis not present

## 2015-11-29 DIAGNOSIS — F329 Major depressive disorder, single episode, unspecified: Secondary | ICD-10-CM

## 2015-11-29 DIAGNOSIS — L89159 Pressure ulcer of sacral region, unspecified stage: Secondary | ICD-10-CM

## 2015-11-29 DIAGNOSIS — M6250 Muscle wasting and atrophy, not elsewhere classified, unspecified site: Secondary | ICD-10-CM

## 2015-11-29 DIAGNOSIS — K219 Gastro-esophageal reflux disease without esophagitis: Secondary | ICD-10-CM

## 2015-11-29 LAB — COMPREHENSIVE METABOLIC PANEL
ALBUMIN: 3.5 g/dL (ref 3.5–5.0)
ALK PHOS: 57 U/L (ref 40–150)
ALT: 23 U/L (ref 0–55)
AST: 27 U/L (ref 5–34)
Anion Gap: 10 mEq/L (ref 3–11)
BILIRUBIN TOTAL: 0.33 mg/dL (ref 0.20–1.20)
BUN: 7.7 mg/dL (ref 7.0–26.0)
CALCIUM: 9.5 mg/dL (ref 8.4–10.4)
CO2: 23 mEq/L (ref 22–29)
CREATININE: 1 mg/dL (ref 0.7–1.3)
Chloride: 111 mEq/L — ABNORMAL HIGH (ref 98–109)
EGFR: 77 mL/min/{1.73_m2} — ABNORMAL LOW (ref >90)
Glucose: 79 mg/dl (ref 70–140)
POTASSIUM: 3.8 meq/L (ref 3.5–5.1)
Sodium: 145 mEq/L (ref 136–145)
TOTAL PROTEIN: 6.4 g/dL (ref 6.4–8.3)

## 2015-11-29 LAB — CBC & DIFF AND RETIC
BASO%: 1.2 % (ref 0.0–2.0)
Basophils Absolute: 0 10*3/uL (ref 0.0–0.1)
EOS%: 1.8 % (ref 0.0–7.0)
Eosinophils Absolute: 0 10*3/uL (ref 0.0–0.5)
HEMATOCRIT: 37 % — AB (ref 38.4–49.9)
HEMOGLOBIN: 12.1 g/dL — AB (ref 13.0–17.1)
Immature Retic Fract: 4 % (ref 3.00–10.60)
LYMPH%: 39.9 % (ref 14.0–49.0)
MCH: 27.4 pg (ref 27.2–33.4)
MCHC: 32.7 g/dL (ref 32.0–36.0)
MCV: 83.9 fL (ref 79.3–98.0)
MONO#: 0.7 10*3/uL (ref 0.1–0.9)
MONO%: 40.5 % — ABNORMAL HIGH (ref 0.0–14.0)
NEUT#: 0.3 10*3/uL — CL (ref 1.5–6.5)
NEUT%: 16.6 % — AB (ref 39.0–75.0)
PLATELETS: 189 10*3/uL (ref 140–400)
RBC: 4.41 10*6/uL (ref 4.20–5.82)
RDW: 18.3 % — ABNORMAL HIGH (ref 11.0–14.6)
RETIC CT ABS: 61.74 10*3/uL (ref 34.80–93.90)
Retic %: 1.4 % (ref 0.80–1.80)
WBC: 1.6 10*3/uL — ABNORMAL LOW (ref 4.0–10.3)
lymph#: 0.7 10*3/uL — ABNORMAL LOW (ref 0.9–3.3)
nRBC: 0 % (ref 0–0)

## 2015-11-29 LAB — LACTATE DEHYDROGENASE: LDH: 197 U/L (ref 125–245)

## 2015-11-29 NOTE — Progress Notes (Signed)
[name] Kitchen  HEMATOLOGY ONCOLOGY PROGRESS NOTE Date of service .[name] Kitchen12/06/2015  Ethan Stewart Care Team: Lona Kettle, MD as PCP - General (Family Medicine) Brunetta Genera, MD as Consulting Physician (Hematology)  CC: follow for diffuse large B-cell lymphoma  Diagnosis: Diffuse large B-cell lymphoma Stage IVAE with extranodal involvement of C5 vertebra status post C5 corpectomy   Treatment:  -Status post 5 cycles of R CHOP (dose reductions for cycle 2-3)  -Cannot use G-CSF due to issues with severe G-CSF related ARDS after cycle 1  INTERVAL HISTORY:  Ethan Stewart is here for his scheduled follow-up  prior to his 6th cycle of R CHOP chemotherapy scheduled for 12/04/2015. He notes no acute new symptoms. Had grade 1-2 fatigue lasting about 3-5 days after his previous cycle of chemotherapy. No fevers or chills. Try to keep physically active. Still undergoing physical therapy for his right upper extremity with significant improvement in his range of motion. No neck pains. No abdominal pain. Had a PET/CT scan that still shows the 1 cm left mesenteric lymph node with significant FDG uptake suggesting persistent/resistant disease. No significant change in size of this lymph node. Ethan Stewart overall feels well. No other acute new focal symptoms. No issues with bleeding on his blood thinners. Has been using his compression socks with resolution of his peripheral edema. He notes that his sacral decubitus ulcer continues to heal.   REVIEW OF SYSTEMS:   10 point review of systems was done and is negative except as noted above  I have reviewed the past medical history, past surgical history, social history and family history with the Ethan Stewart and they are unchanged from previous note.  ALLERGIES:  is allergic to neulasta.  MEDICATIONS:  Current Outpatient Prescriptions  Medication Sig Dispense Refill  . acetaminophen (TYLENOL) 325 MG tablet Take 2 tablets (650 mg total) by mouth every 6 (six) hours as needed for mild  pain, moderate pain, fever or headache (or Fever >/= 101).    . Amino Acids-Protein Hydrolys (FEEDING SUPPLEMENT, PRO-STAT SUGAR FREE 64,) LIQD Take 30 mLs by mouth 2 (two) times daily with a meal.    . apixaban (ELIQUIS) 5 MG TABS tablet Take 1 tablet (5 mg total) by mouth 2 (two) times daily. 60 tablet 0  . collagenase (SANTYL) ointment Apply topically daily. 15 g 0  . mirtazapine (REMERON) 15 MG tablet Take 1 tablet (15 mg total) by mouth at bedtime. Might increase to 68m po HS in 7 days if still having insomnia 60 tablet 1  . Multiple Vitamin (MULTIVITAMIN WITH MINERALS) TABS tablet Take 1 tablet by mouth daily.    .[name] Kitchenneomycin-bacitracin-polymyxin (NEOSPORIN) ointment Apply 1 application topically as needed for wound care (left foot blister).     .[name] Kitchenomeprazole (PRILOSEC) 40 MG capsule Take 1 capsule (40 mg total) by mouth daily. 30 capsule 2  . ondansetron (ZOFRAN) 8 MG tablet Take 1 tablet (8 mg total) by mouth 2 (two) times daily. Start the day after chemo for 3 days. Then as needed for nausea or vomiting. 30 tablet 1  . oxandrolone (OXANDRIN) 2.5 MG tablet Take 1 tablet (2.5 mg total) by mouth 2 (two) times daily. 60 tablet 0  . polyethylene glycol (MIRALAX / GLYCOLAX) packet Take 17 g by mouth daily.    . predniSONE (DELTASONE) 20 MG tablet Take 3 tablets (60 mg total) by mouth daily with breakfast. D2-5 after chemotherapy each cycle 50 tablet 0  . PRESCRIPTION MEDICATION [facility] CHEMO    . prochlorperazine (COMPAZINE) 10 MG  tablet Take 1 tablet (10 mg total) by mouth every 6 (six) hours as needed (Nausea or vomiting). 30 tablet 6  . senna-docusate (SENNA S) 8.6-50 MG per tablet Take 2 tablets by mouth at bedtime. 60 tablet 1  . temazepam (RESTORIL) 15 MG capsule Take 15 mg by mouth at bedtime and may repeat dose one time if needed.     No current facility-administered medications for this visit.    PHYSICAL EXAMINATION: ECOG PERFORMANCE STATUS: 1 - Symptomatic but completely  ambulatory  Filed Vitals:   11/29/15 0847  BP: 159/90  Pulse: 97  Temp: 98.4 F (36.9 C)  Resp: 19   Filed Weights   11/29/15 0847  Weight: 219 lb 6.4 oz (99.519 kg)    GENERAL:alert, no distress and comfortable SKIN: no acute rashes. Sacral decubitus ulcer not examined today -Ethan Stewart following wound care and notes that it is continuing to improve. EYES: normal, Conjunctiva are pink and non-injected, sclera clear OROPHARYNX:no exudate, no erythema and lips, buccal mucosa, and tongue normal  NECK: supple, thyroid normal size, non-tender, without nodularity LYMPH:no palpable cervical, axillary or inguinal lymphadenopathy . LUNGS: clear to auscultation and percussion with normal breathing effort HEART: regular rate & rhythm and no murmurs and no lower extremity edema ABDOMEN:abdomen soft, non-tender and normal bowel sounds Musculoskeletal:no cyanosis of digits and no clubbing  NEURO: alert & oriented x 3 with fluent speech, 4+/5 weakness right upper extremity abduction-improving strength.  LABORATORY DATA:   CBC Latest Ref Rng 11/29/2015 11/13/2015 10/30/2015  WBC 4.0 - 10.3 10e3/uL 1.6(L) 3.0(L) 6.5  Hemoglobin 13.0 - 17.1 g/dL 12.1(L) 11.2(L) 10.3(L)  Hematocrit 38.4 - 49.9 % 37.0(L) 35.0(L) 31.4(L)  Platelets 140 - 400 10e3/uL 189 231 247   ANC 300  CMP Latest Ref Rng 11/29/2015 11/13/2015 10/30/2015  Glucose 70 - 140 mg/dl 79 101 77  BUN 7.0 - 26.0 mg/dL 7.7 7.9 10.8  Creatinine 0.7 - 1.3 mg/dL 1.0 0.9 0.8  Sodium 136 - 145 mEq/L 145 144 141  Potassium 3.5 - 5.1 mEq/L 3.8 4.0 4.1  Chloride 101 - 111 mmol/L - - -  CO2 22 - 29 mEq/L '23 23 24  ' Calcium 8.4 - 10.4 mg/dL 9.5 9.3 9.6  Total Protein 6.4 - 8.3 g/dL 6.4 5.9(L) 6.0(L)  Total Bilirubin 0.20 - 1.20 mg/dL 0.33 <0.30 0.32  Alkaline Phos 40 - 150 U/L 57 57 47  AST 5 - 34 U/L '27 30 18  ' ALT 0 - 55 U/L '23 26 25   ' . Lab Results  Component Value Date   LDH 197 11/29/2015   RADIOGRAPHIC STUDIES: I have personally  reviewed the radiological images as listed and agreed with the findings in the report. Nm Pet Image Restag (ps) Skull Base To Thigh  11/28/2015  CLINICAL DATA:  Subsequent treatment strategy for diffuse large B-cell lymphoma. EXAM: NUCLEAR MEDICINE PET SKULL BASE TO THIGH TECHNIQUE: 11.6 mCi F-18 FDG was injected intravenously. Full-ring PET imaging was performed from the skull base to thigh after the radiotracer. CT data was obtained and used for attenuation correction and anatomic localization. FASTING BLOOD GLUCOSE:  Value: 80 mg/dl COMPARISON:  11/10/2015 PET-CT. FINDINGS: NECK No hypermetabolic lymph nodes in the neck. CHEST Stable 4 mm solid subpleural right lower lobe pulmonary nodule (series 6/image 42), without metabolic activity. Stable 4 mm medial right lower lobe pulmonary nodule (6/27), without metabolic activity. There is a mildly hypermetabolic 2.9 x 1.2 cm basilar right lower lobe pulmonary nodule (6/54) with max SUV 4.2, unchanged in  size with previous max SUV 4.8, slightly decreased. No acute consolidative airspace disease or new significant pulmonary nodules. No hypermetabolic axillary, mediastinal or hilar nodes. Right internal jugular MediPort terminates in the lower third of the superior vena cava. Left anterior descending and right coronary atherosclerosis. ABDOMEN/PELVIS There is a mildly enlarged hypermetabolic 1.0 cm left mesenteric lymph node (series 4/image 152) with max SUV 12.7, previously 1.0 cm with max SUV 11.8, slightly increased. No additional hypermetabolic nodes in the abdomen or pelvis. No abnormal hypermetabolic activity within the liver, pancreas, adrenal glands, or spleen. Re- demonstrated is pneumobilia throughout the intrahepatic and extrahepatic bile ducts and within the gallbladder, presumably from prior sphincterotomy. No biliary ductal dilatation. Stable parapelvic renal cysts in the left greater than right renal sinuses. Stable mild diffuse wall thickening and  trabeculation in the bladder, suggesting chronic bladder outlet obstruction by the mildly enlarged prostate. Re- demonstrated is skin thickening and associated subcutaneous fat stranding overlying the coccyx and lower sacrum with max SUV 5.6, previously 5.9, not appreciably changed. No associated sacrococcygeal bone lesion. SKELETON The Ethan Stewart is status post C5 corpectomy and anterior cervical spine fusion from C4-C6, with associated mild hypermetabolism with max SUV 5.5, previously 4.9, not appreciably changed. There is focal hypermetabolism between the L3 and L4 spinous processes with max SUV 3.6, previously 3.8, not appreciably changed. IMPRESSION: 1. Slightly increased hypermetabolism within the mildly enlarged hypermetabolic left mesenteric lymph node. 2. Slightly decreased hypermetabolism within the basilar right lower lobe 2.9 cm pulmonary nodule, which is indeterminate and could represent resolving lung infection/inflammation, less likely pulmonary lymphoma. Two additional stable 4 mm right lower lobe pulmonary nodules, which are below PET resolution. 3. Stable mild focal hypermetabolism between the L3 and L4 spinous processes, nonspecific. 4. Stable mild hypermetabolism associated with postsurgical changes in the lower cervical spine. 5. Stable hypermetabolism in the superficial soft tissues overlying the coccyx and lower sacrum, most in keeping with cellulitis. 6. No new sites of hypermetabolic lymphoma. Electronically Signed   By: Ilona Sorrel M.D.   On: 11/28/2015 09:07     PET/CT 11/10/2015: NUCLEAR MEDICINE PET SKULL BASE TO THIGH  TECHNIQUE: 10.6 mCi F-18 FDG was injected intravenously. Full-ring PET imaging was performed from the skull base to thigh after the radiotracer. CT data was obtained and used for attenuation correction and anatomic localization.  FASTING BLOOD GLUCOSE: Value: 90 mg/dl  COMPARISON: 09/29/2015  FINDINGS: NECK  No hypermetabolic lymph nodes in the  neck.  CHEST  No hypermetabolic mediastinal or hilar nodes. No suspicious pulmonary nodules on the CT scan. Posterior right lower lobe subpleural density measures 3.2 cm and has an SUV max equal to 4.84. On the previous exam this measured 4.2 cm and had an SUV max equal to 5.03.  ABDOMEN/PELVIS  No abnormal hypermetabolic activity within the liver, pancreas, adrenal glands, or spleen. Soft tissue stranding is identified within the root of mesenteric. There is a hypermetabolic lymph node within this area which is new from previous exam measuring 11 mm. The SUV max is equal to 11.75. Also new from previous exam is a focus of increased uptake within the small bowel mesenteric within SUV max of 3.79, image 175. Interval decrease in FDG uptake associated with the previous hypermetabolic left inguinal lymph node. SUV max is equal to 1.66 currently, image number 223. Previously 10.0. SUV max associated with index right inguinal lymph node is equal to 1.78. Previously 2.98. There has been interval improvement and FDG uptake overlying the  SKELETON  Hyper  metabolism within the subcutaneous soft tissues overlying the lower sacrum has improved in the interval. SUV max is currently equal to 5.85. Previously 7.3. Decrease FDG uptake associated with postsurgical changes involving the cervical spine. SUV max currently 4.87. Previously 6.1. Increased FDG uptake between the L3 and L4 spinous process ease noted within SUV max equal to 3.8. Previously 4.16.  IMPRESSION: 1. Mixed interval response to therapy. 2. Interval improvement an FDG uptake associated with bilateral inguinal lymph nodes. 3. New foci of increased uptake within the small bowel mesenteric has an SUV max equal to 11.75. Suspicious for persistent or recurrent disease. 4. Persistent hypermetabolic subpleural density within the posterior right lower lobe. This demonstrates mild decrease in size in the interval but  continues to exhibit malignant range FDG uptake. This may be postinflammatory or infectious. Pulmonary manifestation of lymphoma is less favored but not excluded.   Electronically Signed  By: Kerby Moors M.D.  On: 11/10/2015 09:38    ASSESSMENT & PLAN:   1) Diffuse large B-cell lymphoma stage IV AE with involvement of mesenteric, inguinal lymph nodes and C5 vertebral body with spinal cord impingement.  He had a C5 corpectomy on 07/26/2015 that showed diffuse large B-cell lymphoma with a Ki-67 ranging from 20 to 90%. Cytogenetics and Fish showed BCL 2 positivity but negative for BCL 6 and cMYC CD10 positivity suggestive of possibly GCB subtype. Plan Ethan Stewart received first cycle of R CHOP on 08/08/2015 and intrathecal methotrexate with hydrocortisone on 08/09/2015. Ethan Stewart subsequently was admitted with ARDS likely due to neulasta -treated with high dose steroids and empirically with broad spectrum antibiotics.  2nd cycle of chemotherapy delayed to allow for rehabilitation (was due on 08/31/2015).  Received R-mini-CHOP for cycle 2 and tolerated it without significant cytopenias. Was admitted briefly for a mild lower extremity cellulitis and DVT. Cellulitis now resolved. Ethan Stewart completing his oral antibiotics.  PET/CT scan on 09/29/2015 with no evidence of disease progression. Good response in his mesenteric lymph nodes. Inguinal lymph nodes on the right side. Decreased uptake in his C5 level.  No new lesions noted.   Cycle 3 (10/02/2015)  of R- CHOP ( we increased the doxorubicin dose back to 50 mg/m and keep the cyclophosphamide dose reduction at 400 mg/m]  Cycle 4 (10/23/2015) of R-CHOP ( we increased the doxorubicin dose back to 50 mg/m and kept the cyclophosphamide dose reduction at 400 mg/m].  PET/CT 11/10/2015: Shows improvement in most lesions but is noted to have a new FDG avid lymph node in the mesentery of the small bowel.  Cycle 5 on 11/13/2015 R CHOP - received full  dose.  Plan -Labs and stable with improvement in hemoglobin and stable platelets. Still somewhat neutropenic with an ANC of 300. No fevers or chills. No mouth sores. We'll need to recheck CBC prior to his sixth cycle of R CHOP to ensure improving ANC. -PET/CT scan reviewed with the Ethan Stewart and his wife. He has a persistent left mesenteric lymph node that is unchanged in size at 1 cm but show significant FDG uptake concerning for persistent DLBCL. -We'll plan to complete the sixth cycle of R CHOP to complete his first line chemotherapy. -I have referred the Ethan Stewart to Dr. Lisbeth Renshaw from radiation oncology to follow-up regarding consideration of possible RT to his C5 disease and to his distant disease in the mesenteric lymph node. -Ideally would have liked a biopsy of the lymph node but this might be difficult given the site. -We will need to plan on further CNS prophylaxis  with intrathecal methotrexate after completion of his R CHOP 6 cycle and possible RT options. At that time Ethan Stewart will hopefully be less limited with low blood counts and being on blood thinners and issues with local infection/inflammation from his decubitus ulcer. -Counseled in detail on infection precautions in the setting of neutropenia and chemotherapy.  2)Acid reflux and gastritis likely related to steroid use -controlled -Continue PPI  3) Decubitus Ulcer over sacral area -improving with optimized nutrition and increased ambulation. Continue follow-up with wound care clinic  4) Insomnia/Depression - much improved. Continue on Remeron. 8) Significant muscle loss due to steroids/hospitalization/debility-improved significantly.  Ethan Stewart is starting to build some muscle mass back and appears to have gained back much of his weight. Plan  -continue optimized nutrition and physical therapy  9)right lower extremity distal DVT with mild cellulitis. Cellulitis resolved. Ethan Stewart tolerating Elliquis. Plan -Continue 3-4 months of  anticoagulation. Would hold if platelet counts less than 50,000 or if bleeding.  Return to care with Dr. Irene Limbo in 3-4 weeks with CBC, CMP.  I spent 20 minutes counseling the Ethan Stewart face to face. The total time spent in the appointment was 25 minutes and more than 50% was on counseling and direct Ethan Stewart cares for discussion of PET/CT findings, goals of care and treatment options.   Sullivan Lone MD [place] [facility]  (Office):       [phone] (Work cell):  [phone] (Fax):           [phone]

## 2015-11-29 NOTE — Telephone Encounter (Signed)
per pof to sch pt appt-gave pt copy of avs-adv Radiation will call to sch

## 2015-11-30 NOTE — Progress Notes (Signed)
Follow  Up New Consult  Diffuse Large cell B-cell Lymphoma with extranodal of C-5 vertebra s/p C-5 corpectomy  Treatment:  Dr. Sullivan Lone ,MD note 11/29/15: -Status post 5 cycles of R CHOP (dose reductions for cycle 2-3)  -Cannot use G-CSF due to issues with severe G-CSF related ARDS after cycle 1  Cycle 5 on 11/13/2015 R CHOP - received full dose.Chemotherapy held today 12/04/15, low counts, saw Dr. Irene Limbo today, will try to do chemotherapy next Monday 12/11/15  PET/CT 11/10/2015: Shows improvement in most lesions but is noted to have a new FDG avid lymph node in the mesentery of the small bowel.  No c/o pain, or discomfort, no coughing, no difficulty  Swallowing foods/fluids, no nausea, , energy level  Poor, wearing mask Allergies: Neulasta  BP 138/83 mmHg  Pulse 110  Temp(Src) 98.2 F (36.8 C) (Oral)  Resp 20  Ht 6\' 1"  (1.854 m)  Wt 219 lb 9.6 oz (99.61 kg)  BMI 28.98 kg/m2  SpO2 98%  Wt Readings from Last 3 Encounters:  12/04/15 219 lb 9.6 oz (99.61 kg)  12/04/15 218 lb 6.4 oz (99.066 kg)  11/29/15 219 lb 6.4 oz (99.519 kg)   1:47 PM

## 2015-12-04 ENCOUNTER — Ambulatory Visit: Payer: Self-pay | Admitting: Hematology

## 2015-12-04 ENCOUNTER — Encounter: Payer: Self-pay | Admitting: Radiation Oncology

## 2015-12-04 ENCOUNTER — Other Ambulatory Visit: Payer: Self-pay | Admitting: *Deleted

## 2015-12-04 ENCOUNTER — Ambulatory Visit: Payer: PPO

## 2015-12-04 ENCOUNTER — Encounter: Payer: Self-pay | Admitting: *Deleted

## 2015-12-04 ENCOUNTER — Other Ambulatory Visit (HOSPITAL_BASED_OUTPATIENT_CLINIC_OR_DEPARTMENT_OTHER): Payer: PPO

## 2015-12-04 ENCOUNTER — Ambulatory Visit
Admission: RE | Admit: 2015-12-04 | Discharge: 2015-12-04 | Disposition: A | Discharge status: Home / Self Care | Payer: PPO | Source: Ambulatory Visit | Attending: Radiation Oncology | Admitting: Radiation Oncology

## 2015-12-04 ENCOUNTER — Telehealth: Payer: Self-pay | Admitting: Hematology

## 2015-12-04 VITALS — BP 138/83 | HR 110 | Temp 98.2°F | Resp 20 | Ht 73.0 in | Wt 219.6 lb

## 2015-12-04 DIAGNOSIS — M899 Disorder of bone, unspecified: Secondary | ICD-10-CM | POA: Insufficient documentation

## 2015-12-04 DIAGNOSIS — C8330 Diffuse large B-cell lymphoma, unspecified site: Secondary | ICD-10-CM | POA: Diagnosis not present

## 2015-12-04 DIAGNOSIS — F419 Anxiety disorder, unspecified: Secondary | ICD-10-CM | POA: Diagnosis not present

## 2015-12-04 DIAGNOSIS — C7951 Secondary malignant neoplasm of bone: Secondary | ICD-10-CM

## 2015-12-04 DIAGNOSIS — C412 Malignant neoplasm of vertebral column: Secondary | ICD-10-CM | POA: Insufficient documentation

## 2015-12-04 DIAGNOSIS — Z510 Encounter for antineoplastic radiation therapy: Secondary | ICD-10-CM | POA: Insufficient documentation

## 2015-12-04 LAB — CBC & DIFF AND RETIC
BASO%: 2.8 % — ABNORMAL HIGH (ref 0.0–2.0)
BASOS ABS: 0.1 10*3/uL (ref 0.0–0.1)
EOS ABS: 0 10*3/uL (ref 0.0–0.5)
EOS%: 0.6 % (ref 0.0–7.0)
HEMATOCRIT: 38.4 % (ref 38.4–49.9)
HEMOGLOBIN: 12.5 g/dL — AB (ref 13.0–17.1)
Immature Retic Fract: 10.6 % (ref 3.00–10.60)
LYMPH#: 0.7 10*3/uL — AB (ref 0.9–3.3)
LYMPH%: 36.7 % (ref 14.0–49.0)
MCH: 27.6 pg (ref 27.2–33.4)
MCHC: 32.6 g/dL (ref 32.0–36.0)
MCV: 84.8 fL (ref 79.3–98.0)
MONO#: 0.5 10*3/uL (ref 0.1–0.9)
MONO%: 29.9 % — ABNORMAL HIGH (ref 0.0–14.0)
NEUT#: 0.5 10*3/uL — CL (ref 1.5–6.5)
NEUT%: 30 % — ABNORMAL LOW (ref 39.0–75.0)
Platelets: 186 10*3/uL (ref 140–400)
RBC: 4.53 10*6/uL (ref 4.20–5.82)
RDW: 18.4 % — AB (ref 11.0–14.6)
RETIC %: 1.76 % (ref 0.80–1.80)
RETIC CT ABS: 79.73 10*3/uL (ref 34.80–93.90)
WBC: 1.8 10*3/uL — ABNORMAL LOW (ref 4.0–10.3)

## 2015-12-04 MED ORDER — ACETAMINOPHEN 325 MG PO TABS
ORAL_TABLET | ORAL | Status: AC
  Filled 2015-12-04: qty 2

## 2015-12-04 MED ORDER — DIPHENHYDRAMINE HCL 25 MG PO CAPS
ORAL_CAPSULE | ORAL | Status: AC
  Filled 2015-12-04: qty 2

## 2015-12-04 NOTE — Progress Notes (Signed)
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 Recently, the patient was in the ICU for 9 days for ARDS after a Neulasta injection. He spent 3 weeks at [facility] for rehabilitation. On PET/CT (11/10/15), there is a marked improvement in most lesions but is noted to have a new FDG avid lymph node in the mesentery of the small bowel.   Last round of chemotherapy next week.   The patient has been referred to me today by Dr.Kale for consideration of possible radiation treatment to his C5 disease and to his distant disease in the mesenteric lymph node   Low WBC today. Decubitus ulcer being treated at wound center, only noted pain by patient.                ALLERGIES:  is allergic to neulasta.  Meds: Current Outpatient Prescriptions  Medication Sig Dispense Refill  . acetaminophen (TYLENOL) 325 MG tablet Take 2 tablets (650 mg total) by mouth every 6 (six) hours as needed for mild pain, moderate pain, fever or headache (or Fever >/= 101).    . Amino Acids-Protein Hydrolys (FEEDING SUPPLEMENT, PRO-STAT SUGAR FREE 64,) LIQD Take 30 mLs by mouth 2 (two) times daily with a meal.    . apixaban (ELIQUIS) 5 MG TABS tablet Take 1 tablet (5 mg total) by mouth 2 (two) times daily. 60 tablet 0  . collagenase (SANTYL) ointment Apply topically daily. 15 g 0  . Multiple Vitamin (MULTIVITAMIN WITH MINERALS) TABS tablet Take 1 tablet by mouth daily.    [name] Kitchen neomycin-bacitracin-polymyxin (NEOSPORIN) ointment Apply 1 application topically as needed for wound care (left foot blister).     [name] Kitchen omeprazole (PRILOSEC) 40  MG capsule Take 1 capsule (40 mg total) by mouth daily. 30 capsule 2  . senna-docusate (SENNA S) 8.6-50 MG per tablet Take 2 tablets by mouth at bedtime. 60 tablet 1  . temazepam (RESTORIL) 15 MG capsule Take 15 mg by mouth at bedtime and may repeat dose one time if needed.    . mirtazapine (REMERON) 15 MG tablet Take 1 tablet (15 mg total) by mouth at bedtime. Might increase to 30mg  po HS in 7 days if still having insomnia 60 tablet 1  . ondansetron (ZOFRAN) 8 MG tablet Take 1 tablet (8 mg total) by mouth 2 (two) times daily. Start the day after chemo for 3 days. Then as needed for nausea or vomiting. (Patient not taking: Reported on 12/04/2015) 30 tablet 1  . polyethylene glycol (MIRALAX / GLYCOLAX) packet Take 17 g by mouth daily.    . predniSONE (DELTASONE) 20 MG tablet Take 3 tablets (60 mg total) by mouth daily with breakfast. D2-5 after chemotherapy each cycle (Patient not taking: Reported on 12/04/2015) 50 tablet 0  . PRESCRIPTION MEDICATION [facility] CHEMO    . prochlorperazine (COMPAZINE) 10 MG tablet Take 1 tablet (10 mg total) by mouth every 6 (six) hours as needed (Nausea or vomiting). (Patient not taking: Reported on 12/04/2015) 30 tablet 6   No current facility-administered medications for this encounter.    Physical Findings: The patient  is in no acute distress. Patient is alert and oriented.  height is 6\' 1"  (1.854 m) and weight is 219 lb 9.6 oz (99.61 kg). His oral temperature is 98.2 F (36.8 C). His blood pressure is 138/83 and his pulse is 110. His respiration is 20 and oxygen saturation is 98%. .     Lab Findings: Lab Results  Component Value Date   WBC 1.8* 12/04/2015   HGB 12.5* 12/04/2015   HCT 38.4 12/04/2015   MCV 84.8 12/04/2015   PLT 186 12/04/2015     Radiographic Findings: Nm Pet Image Restag (ps) Skull Base To Thigh  11/28/2015  CLINICAL DATA:  Subsequent treatment strategy for diffuse large B-cell lymphoma. EXAM: NUCLEAR MEDICINE PET SKULL BASE TO THIGH  TECHNIQUE: 11.6 mCi F-18 FDG was injected intravenously. Full-ring PET imaging was performed from the skull base to thigh after the radiotracer. CT data was obtained and used for attenuation correction and anatomic localization. FASTING BLOOD GLUCOSE:  Value: 80 mg/dl COMPARISON:  11/10/2015 PET-CT. FINDINGS: NECK No hypermetabolic lymph nodes in the neck. CHEST Stable 4 mm solid subpleural right lower lobe pulmonary nodule (series 6/image 42), without metabolic activity. Stable 4 mm medial right lower lobe pulmonary nodule (6/27), without metabolic activity. There is a mildly hypermetabolic 2.9 x 1.2 cm basilar right lower lobe pulmonary nodule (6/54) with max SUV 4.2, unchanged in size with previous max SUV 4.8, slightly decreased. No acute consolidative airspace disease or new significant pulmonary nodules. No hypermetabolic axillary, mediastinal or hilar nodes. Right internal jugular MediPort terminates in the lower third of the superior vena cava. Left anterior descending and right coronary atherosclerosis. ABDOMEN/PELVIS There is a mildly enlarged hypermetabolic 1.0 cm left mesenteric lymph node (series 4/image 152) with max SUV 12.7, previously 1.0 cm with max SUV 11.8, slightly increased. No additional hypermetabolic nodes in the abdomen or pelvis. No abnormal hypermetabolic activity within the liver, pancreas, adrenal glands, or spleen. Re- demonstrated is pneumobilia throughout the intrahepatic and extrahepatic bile ducts and within the gallbladder, presumably from prior sphincterotomy. No biliary ductal dilatation. Stable parapelvic renal cysts in the left greater than right renal sinuses. Stable mild diffuse wall thickening and trabeculation in the bladder, suggesting chronic bladder outlet obstruction by the mildly enlarged prostate. Re- demonstrated is skin thickening and associated subcutaneous fat stranding overlying the coccyx and lower sacrum with max SUV 5.6, previously 5.9, not appreciably  changed. No associated sacrococcygeal bone lesion. SKELETON The patient is status post C5 corpectomy and anterior cervical spine fusion from C4-C6, with associated mild hypermetabolism with max SUV 5.5, previously 4.9, not appreciably changed. There is focal hypermetabolism between the L3 and L4 spinous processes with max SUV 3.6, previously 3.8, not appreciably changed. IMPRESSION: 1. Slightly increased hypermetabolism within the mildly enlarged hypermetabolic left mesenteric lymph node. 2. Slightly decreased hypermetabolism within the basilar right lower lobe 2.9 cm pulmonary nodule, which is indeterminate and could represent resolving lung infection/inflammation, less likely pulmonary lymphoma. Two additional stable 4 mm right lower lobe pulmonary nodules, which are below PET resolution. 3. Stable mild focal hypermetabolism between the L3 and L4 spinous processes, nonspecific. 4. Stable mild hypermetabolism associated with postsurgical changes in the lower cervical spine. 5. Stable hypermetabolism in the superficial soft tissues overlying the coccyx and lower sacrum, most in keeping with cellulitis. 6. No new sites of hypermetabolic lymphoma. Electronically Signed   By: Ilona Sorrel M.D.   On: 11/28/2015 09:07   Nm Pet Image Restag (ps) Skull Base To Thigh  11/10/2015  CLINICAL DATA:  Subsequent treatment strategy for lymphoma. EXAM: NUCLEAR MEDICINE PET SKULL BASE TO THIGH TECHNIQUE: 10.6 mCi F-18 FDG was injected intravenously. Full-ring PET imaging was performed from the skull base to thigh after the radiotracer. CT data was obtained and used for attenuation correction and anatomic localization. FASTING BLOOD GLUCOSE:  Value: 90 mg/dl COMPARISON:  09/29/2015 FINDINGS: NECK No hypermetabolic lymph nodes in the neck. CHEST No hypermetabolic mediastinal or hilar nodes. No suspicious pulmonary nodules on the CT scan. Posterior right lower lobe subpleural density measures 3.2 cm and has an SUV max equal to  4.84. On the previous exam this measured 4.2 cm and had an SUV max equal to 5.03. ABDOMEN/PELVIS No abnormal hypermetabolic activity within the liver, pancreas, adrenal glands, or spleen. Soft tissue stranding is identified within the root of mesenteric. There is a hypermetabolic lymph node within this area which is new from previous exam measuring 11 mm. The SUV max is equal to 11.75. Also new from previous exam is a focus of increased uptake within the small bowel mesenteric within SUV max of 3.79, image 175. Interval decrease in FDG uptake associated with the previous hypermetabolic left inguinal lymph node. SUV max is equal to 1.66 currently, image number 223. Previously 10.0. SUV max associated with index right inguinal lymph node is equal to 1.78. Previously 2.98. There has been interval improvement and FDG uptake overlying the SKELETON Hyper metabolism within the subcutaneous soft tissues overlying the lower sacrum has improved in the interval. SUV max is currently equal to 5.85. Previously 7.3. Decrease FDG uptake associated with postsurgical changes involving the cervical spine. SUV max currently 4.87. Previously 6.1. Increased FDG uptake between the L3 and L4 spinous process ease noted within SUV max equal to 3.8. Previously 4.16. IMPRESSION: 1. Mixed interval response to therapy. 2. Interval improvement an FDG uptake associated with bilateral inguinal lymph nodes. 3. New foci of increased uptake within the small bowel mesenteric has an SUV max equal to 11.75. Suspicious for persistent or recurrent disease. 4. Persistent hypermetabolic subpleural density within the posterior right lower lobe. This demonstrates mild decrease in size in the interval but continues to exhibit malignant range FDG uptake. This may be postinflammatory or infectious. Pulmonary manifestation of lymphoma is less favored but not excluded. Electronically Signed   By: Kerby Moors M.D.   On: 11/10/2015 09:38     DLBCL (diffuse  large B cell lymphoma) ([place])   06/21/2015 Imaging CT Chest/abd/pelvis: IMPRESSION: 5 mm nodule within the right lower lobe as described.  Pneumobilia consistent with a prior sphincterotomy.  No acute abnormality is identified. No findings to suggest chest etiology for the known lesion at C5 are seen.   06/21/2015 Imaging MRI c spine1. Diffuse marrow replacement of the C5 vertebral body highly concerning for neoplasm (metastatic disease, myeloma, or lymphoma). Epidural tumor at this level results in moderate spinal stenosis with moderate impression on the spinal cord    07/10/2015 PET scan 1. The previously demonstrated abnormal C5 vertebral body is hypermetabolic. No other osseous lesions demonstrated. 2. There are small hypermetabolic lymph nodes within the left mesentery and both inguinal regions.    07/26/2015 Initial Biopsy Had a C5 corpectomy pathology showed diffuse large B-cell lymphoma   08/08/2015 -  Chemotherapy Started for cycle of R CHOP.    08/09/2015 -  Chemotherapy Received first cycle of intrathecal methotrexate plus hydrocortisone for CNS prophylaxis.     Impression: The patient has been diagnosed with diffuse large cell B-cell Lymphoma with  extranodal of C-5 vertebra s/p C-5 corpectomy, stage IV. The patient would be a good candidate to receive radiation treatment for his C5 and abdominal lymph node disease.   Plan:  We discussed the possible side effects and risks of treatment in addition to the possible benefits of treatment. We discussed the protocol for radiation treatment.  All of the patient's questions were answered. The patient does wish to proceed with this treatment. A simulation will be scheduled such that we can proceed with treatment planning. We will set up a simulation about 2 weeks after final chemotherapy cycle.   I anticipate a course of 10 treatments. We will proceed with treatment several weeks after he completes his final cycle of  chemotherapy.   ------------------------------------------------  Jodelle Gross, MD, PhD  This document serves as a record of services personally performed by Kyung Rudd, MD. It was created on his behalf by Derek Mound, a trained medical scribe. The creation of this record is based on the scribe's personal observations and the provider's statements to them. This document has been checked and approved by the attending provider.

## 2015-12-04 NOTE — Telephone Encounter (Signed)
Per 12/12 pof r/s tx to 12/19. Left message for patient and mailed schedule.

## 2015-12-04 NOTE — Progress Notes (Signed)
Please see the Nurse Progress Note in the MD Initial Consult Encounter for this patient. 

## 2015-12-05 ENCOUNTER — Encounter: Payer: Self-pay | Admitting: Radiation Oncology

## 2015-12-08 DIAGNOSIS — M109 Gout, unspecified: Secondary | ICD-10-CM | POA: Diagnosis not present

## 2015-12-08 DIAGNOSIS — L89150 Pressure ulcer of sacral region, unstageable: Secondary | ICD-10-CM | POA: Diagnosis not present

## 2015-12-08 DIAGNOSIS — C8510 Unspecified B-cell lymphoma, unspecified site: Secondary | ICD-10-CM | POA: Diagnosis not present

## 2015-12-08 DIAGNOSIS — L89153 Pressure ulcer of sacral region, stage 3: Secondary | ICD-10-CM | POA: Diagnosis not present

## 2015-12-11 ENCOUNTER — Other Ambulatory Visit: Payer: Self-pay | Admitting: *Deleted

## 2015-12-11 ENCOUNTER — Ambulatory Visit (HOSPITAL_BASED_OUTPATIENT_CLINIC_OR_DEPARTMENT_OTHER): Payer: PPO

## 2015-12-11 ENCOUNTER — Other Ambulatory Visit (HOSPITAL_BASED_OUTPATIENT_CLINIC_OR_DEPARTMENT_OTHER): Payer: PPO

## 2015-12-11 VITALS — BP 136/76 | HR 80 | Temp 98.0°F | Resp 17

## 2015-12-11 DIAGNOSIS — C8330 Diffuse large B-cell lymphoma, unspecified site: Secondary | ICD-10-CM

## 2015-12-11 DIAGNOSIS — C8338 Diffuse large B-cell lymphoma, lymph nodes of multiple sites: Secondary | ICD-10-CM | POA: Diagnosis not present

## 2015-12-11 DIAGNOSIS — Z5112 Encounter for antineoplastic immunotherapy: Secondary | ICD-10-CM | POA: Diagnosis not present

## 2015-12-11 DIAGNOSIS — Z5111 Encounter for antineoplastic chemotherapy: Secondary | ICD-10-CM

## 2015-12-11 LAB — CBC & DIFF AND RETIC
BASO%: 1 % (ref 0.0–2.0)
BASOS ABS: 0 10*3/uL (ref 0.0–0.1)
EOS%: 1 % (ref 0.0–7.0)
Eosinophils Absolute: 0 10*3/uL (ref 0.0–0.5)
HEMATOCRIT: 38.1 % — AB (ref 38.4–49.9)
HEMOGLOBIN: 12.4 g/dL — AB (ref 13.0–17.1)
Immature Retic Fract: 7 % (ref 3.00–10.60)
LYMPH%: 39.4 % (ref 14.0–49.0)
MCH: 27.3 pg (ref 27.2–33.4)
MCHC: 32.5 g/dL (ref 32.0–36.0)
MCV: 83.7 fL (ref 79.3–98.0)
MONO#: 1.1 10*3/uL — ABNORMAL HIGH (ref 0.1–0.9)
MONO%: 26.2 % — AB (ref 0.0–14.0)
NEUT#: 1.4 10*3/uL — ABNORMAL LOW (ref 1.5–6.5)
NEUT%: 32.4 % — AB (ref 39.0–75.0)
PLATELETS: 176 10*3/uL (ref 140–400)
RBC: 4.55 10*6/uL (ref 4.20–5.82)
RDW: 17.4 % — ABNORMAL HIGH (ref 11.0–14.6)
Retic %: 0.99 % (ref 0.80–1.80)
Retic Ct Abs: 45.05 10*3/uL (ref 34.80–93.90)
WBC: 4.2 10*3/uL (ref 4.0–10.3)
lymph#: 1.6 10*3/uL (ref 0.9–3.3)
nRBC: 0 % (ref 0–0)

## 2015-12-11 LAB — COMPREHENSIVE METABOLIC PANEL
ALBUMIN: 3.6 g/dL (ref 3.5–5.0)
ALK PHOS: 74 U/L (ref 40–150)
ALT: 32 U/L (ref 0–55)
ANION GAP: 9 meq/L (ref 3–11)
AST: 35 U/L — ABNORMAL HIGH (ref 5–34)
BUN: 16.1 mg/dL (ref 7.0–26.0)
CALCIUM: 9.1 mg/dL (ref 8.4–10.4)
CO2: 24 mEq/L (ref 22–29)
Chloride: 108 mEq/L (ref 98–109)
Creatinine: 1.1 mg/dL (ref 0.7–1.3)
EGFR: 70 mL/min/{1.73_m2} — AB (ref >90)
Glucose: 86 mg/dl (ref 70–140)
POTASSIUM: 4.1 meq/L (ref 3.5–5.1)
Sodium: 142 mEq/L (ref 136–145)
Total Bilirubin: 0.37 mg/dL (ref 0.20–1.20)
Total Protein: 6.5 g/dL (ref 6.4–8.3)

## 2015-12-11 MED ORDER — SODIUM CHLORIDE 0.9 % IV SOLN
750.0000 mg/m2 | Freq: Once | INTRAVENOUS | Status: AC
  Administered 2015-12-11: 1660 mg via INTRAVENOUS
  Filled 2015-12-11: qty 83

## 2015-12-11 MED ORDER — SODIUM CHLORIDE 0.9 % IJ SOLN
10.0000 mL | INTRAMUSCULAR | Status: DC | PRN
  Administered 2015-12-11: 10 mL
  Filled 2015-12-11: qty 10

## 2015-12-11 MED ORDER — SODIUM CHLORIDE 0.9 % IV SOLN
Freq: Once | INTRAVENOUS | Status: AC
  Administered 2015-12-11: 09:00:00 via INTRAVENOUS

## 2015-12-11 MED ORDER — DIPHENHYDRAMINE HCL 25 MG PO CAPS
ORAL_CAPSULE | ORAL | Status: AC
  Filled 2015-12-11: qty 2

## 2015-12-11 MED ORDER — ACETAMINOPHEN 325 MG PO TABS
ORAL_TABLET | ORAL | Status: AC
  Filled 2015-12-11: qty 2

## 2015-12-11 MED ORDER — DOXORUBICIN HCL CHEMO IV INJECTION 2 MG/ML
50.0000 mg/m2 | Freq: Once | INTRAVENOUS | Status: AC
  Administered 2015-12-11: 112 mg via INTRAVENOUS
  Filled 2015-12-11: qty 56

## 2015-12-11 MED ORDER — HEPARIN SOD (PORK) LOCK FLUSH 100 UNIT/ML IV SOLN
500.0000 [IU] | Freq: Once | INTRAVENOUS | Status: AC | PRN
  Administered 2015-12-11: 500 [IU]
  Filled 2015-12-11: qty 5

## 2015-12-11 MED ORDER — DIPHENHYDRAMINE HCL 25 MG PO CAPS
50.0000 mg | ORAL_CAPSULE | Freq: Once | ORAL | Status: AC
  Administered 2015-12-11: 50 mg via ORAL

## 2015-12-11 MED ORDER — ACETAMINOPHEN 325 MG PO TABS
650.0000 mg | ORAL_TABLET | Freq: Once | ORAL | Status: AC
  Administered 2015-12-11: 650 mg via ORAL

## 2015-12-11 MED ORDER — SODIUM CHLORIDE 0.9 % IV SOLN
Freq: Once | INTRAVENOUS | Status: AC
  Administered 2015-12-11: 09:00:00 via INTRAVENOUS
  Filled 2015-12-11: qty 8

## 2015-12-11 MED ORDER — VINCRISTINE SULFATE CHEMO INJECTION 1 MG/ML
2.0000 mg | Freq: Once | INTRAVENOUS | Status: AC
  Administered 2015-12-11: 2 mg via INTRAVENOUS
  Filled 2015-12-11: qty 2

## 2015-12-11 MED ORDER — SODIUM CHLORIDE 0.9 % IV SOLN
375.0000 mg/m2 | Freq: Once | INTRAVENOUS | Status: AC
  Administered 2015-12-11: 800 mg via INTRAVENOUS
  Filled 2015-12-11: qty 80

## 2015-12-11 NOTE — Patient Instructions (Signed)
[facility] Discharge Instructions for Patients Receiving Chemotherapy  Today you received the following chemotherapy agents: Adriamycin, Vincristine, Cytoxan, and Rituxan.   To help prevent nausea and vomiting after your treatment, we encourage you to take your nausea medication as directed.    If you develop nausea and vomiting that is not controlled by your nausea medication, call the clinic.   BELOW ARE SYMPTOMS THAT SHOULD BE REPORTED IMMEDIATELY:  *FEVER GREATER THAN 100.5 F  *CHILLS WITH OR WITHOUT FEVER  NAUSEA AND VOMITING THAT IS NOT CONTROLLED WITH YOUR NAUSEA MEDICATION  *UNUSUAL SHORTNESS OF BREATH  *UNUSUAL BRUISING OR BLEEDING  TENDERNESS IN MOUTH AND THROAT WITH OR WITHOUT PRESENCE OF ULCERS  *URINARY PROBLEMS  *BOWEL PROBLEMS  UNUSUAL RASH Items with * indicate a potential emergency and should be followed up as soon as possible.  Feel free to call the clinic you have any questions or concerns. The clinic phone number is [phone].  Please show the CHEMO ALERT CARD at check-in to the Emergency Department and triage nurse.   

## 2015-12-11 NOTE — Progress Notes (Signed)
Per Darden Dates, Dr. Grier Mitts RN, okay to treat despite ANC of 1.4 and no need to wait on CMET to result.

## 2015-12-11 NOTE — Progress Notes (Signed)
Ok to proceed with chemotherapy without waiting for CMET per MD Irene Limbo

## 2015-12-12 ENCOUNTER — Other Ambulatory Visit: Payer: Self-pay | Admitting: Hematology

## 2015-12-13 ENCOUNTER — Other Ambulatory Visit: Payer: Self-pay

## 2015-12-19 ENCOUNTER — Other Ambulatory Visit (HOSPITAL_BASED_OUTPATIENT_CLINIC_OR_DEPARTMENT_OTHER): Payer: PPO

## 2015-12-19 DIAGNOSIS — C8330 Diffuse large B-cell lymphoma, unspecified site: Secondary | ICD-10-CM | POA: Diagnosis not present

## 2015-12-19 LAB — COMPREHENSIVE METABOLIC PANEL
ALBUMIN: 3.5 g/dL (ref 3.5–5.0)
ALK PHOS: 71 U/L (ref 40–150)
ALT: 25 U/L (ref 0–55)
ANION GAP: 9 meq/L (ref 3–11)
AST: 19 U/L (ref 5–34)
BUN: 16.7 mg/dL (ref 7.0–26.0)
CALCIUM: 9.2 mg/dL (ref 8.4–10.4)
CHLORIDE: 109 meq/L (ref 98–109)
CO2: 24 mEq/L (ref 22–29)
CREATININE: 0.9 mg/dL (ref 0.7–1.3)
EGFR: 83 mL/min/{1.73_m2} — ABNORMAL LOW (ref >90)
Glucose: 88 mg/dl (ref 70–140)
POTASSIUM: 3.8 meq/L (ref 3.5–5.1)
Sodium: 142 mEq/L (ref 136–145)
Total Bilirubin: <0.3 mg/dL (ref 0.20–1.20)
Total Protein: 6.3 g/dL — ABNORMAL LOW (ref 6.4–8.3)

## 2015-12-19 LAB — CBC & DIFF AND RETIC
BASO%: 3.6 % — ABNORMAL HIGH (ref 0.0–2.0)
BASOS ABS: 0.1 10*3/uL (ref 0.0–0.1)
EOS%: 2.1 % (ref 0.0–7.0)
Eosinophils Absolute: 0.1 10*3/uL (ref 0.0–0.5)
HEMATOCRIT: 38 % — AB (ref 38.4–49.9)
HEMOGLOBIN: 12.6 g/dL — AB (ref 13.0–17.1)
Immature Retic Fract: 9.7 % (ref 3.00–10.60)
LYMPH%: 21.2 % (ref 14.0–49.0)
MCH: 27.4 pg (ref 27.2–33.4)
MCHC: 33.1 g/dL (ref 32.0–36.0)
MCV: 82.8 fL (ref 79.3–98.0)
MONO#: 0.1 10*3/uL (ref 0.1–0.9)
MONO%: 3.2 % (ref 0.0–14.0)
NEUT#: 2.1 10*3/uL (ref 1.5–6.5)
NEUT%: 69.9 % (ref 39.0–75.0)
Platelets: 156 10*3/uL (ref 140–400)
RBC: 4.59 10*6/uL (ref 4.20–5.82)
RDW: 18.7 % — ABNORMAL HIGH (ref 11.0–14.6)
Retic %: <0.38 % — ABNORMAL LOW (ref 0.5–1.6)
Retic Ct Abs: 7.8 10*3/uL — ABNORMAL LOW (ref 34.80–93.90)
WBC: 2.9 10*3/uL — ABNORMAL LOW (ref 4.0–10.3)
lymph#: 0.6 10*3/uL — ABNORMAL LOW (ref 0.9–3.3)
nRBC: 0 % (ref 0–0)

## 2015-12-19 LAB — LACTATE DEHYDROGENASE: LDH: 150 U/L (ref 125–245)

## 2015-12-22 DIAGNOSIS — C8510 Unspecified B-cell lymphoma, unspecified site: Secondary | ICD-10-CM | POA: Diagnosis not present

## 2015-12-22 DIAGNOSIS — L89150 Pressure ulcer of sacral region, unstageable: Secondary | ICD-10-CM | POA: Diagnosis not present

## 2015-12-22 DIAGNOSIS — L89153 Pressure ulcer of sacral region, stage 3: Secondary | ICD-10-CM | POA: Diagnosis not present

## 2015-12-22 DIAGNOSIS — M109 Gout, unspecified: Secondary | ICD-10-CM | POA: Diagnosis not present

## 2015-12-26 ENCOUNTER — Emergency Department (HOSPITAL_COMMUNITY): Payer: PPO

## 2015-12-26 ENCOUNTER — Telehealth: Payer: Self-pay | Admitting: Hematology

## 2015-12-26 ENCOUNTER — Other Ambulatory Visit: Payer: Self-pay | Admitting: Hematology

## 2015-12-26 ENCOUNTER — Inpatient Hospital Stay (HOSPITAL_COMMUNITY)
Admission: EM | Admit: 2015-12-26 | Discharge: 2015-12-29 | DRG: 808 | Disposition: A | Discharge status: Home / Self Care | Payer: PPO | Attending: Internal Medicine | Admitting: Internal Medicine

## 2015-12-26 DIAGNOSIS — K2970 Gastritis, unspecified, without bleeding: Secondary | ICD-10-CM | POA: Diagnosis present

## 2015-12-26 DIAGNOSIS — Z85830 Personal history of malignant neoplasm of bone: Secondary | ICD-10-CM | POA: Diagnosis not present

## 2015-12-26 DIAGNOSIS — L8990 Pressure ulcer of unspecified site, unspecified stage: Secondary | ICD-10-CM | POA: Diagnosis present

## 2015-12-26 DIAGNOSIS — F419 Anxiety disorder, unspecified: Secondary | ICD-10-CM | POA: Diagnosis present

## 2015-12-26 DIAGNOSIS — T451X5A Adverse effect of antineoplastic and immunosuppressive drugs, initial encounter: Secondary | ICD-10-CM | POA: Insufficient documentation

## 2015-12-26 DIAGNOSIS — R5081 Fever presenting with conditions classified elsewhere: Secondary | ICD-10-CM | POA: Diagnosis present

## 2015-12-26 DIAGNOSIS — J181 Lobar pneumonia, unspecified organism: Secondary | ICD-10-CM | POA: Diagnosis present

## 2015-12-26 DIAGNOSIS — Z888 Allergy status to other drugs, medicaments and biological substances status: Secondary | ICD-10-CM

## 2015-12-26 DIAGNOSIS — K219 Gastro-esophageal reflux disease without esophagitis: Secondary | ICD-10-CM | POA: Diagnosis present

## 2015-12-26 DIAGNOSIS — R509 Fever, unspecified: Secondary | ICD-10-CM | POA: Diagnosis present

## 2015-12-26 DIAGNOSIS — L89159 Pressure ulcer of sacral region, unspecified stage: Secondary | ICD-10-CM | POA: Diagnosis not present

## 2015-12-26 DIAGNOSIS — T380X5A Adverse effect of glucocorticoids and synthetic analogues, initial encounter: Secondary | ICD-10-CM | POA: Diagnosis present

## 2015-12-26 DIAGNOSIS — Z87891 Personal history of nicotine dependence: Secondary | ICD-10-CM | POA: Diagnosis not present

## 2015-12-26 DIAGNOSIS — Z510 Encounter for antineoplastic radiation therapy: Secondary | ICD-10-CM | POA: Insufficient documentation

## 2015-12-26 DIAGNOSIS — C8330 Diffuse large B-cell lymphoma, unspecified site: Secondary | ICD-10-CM | POA: Diagnosis present

## 2015-12-26 DIAGNOSIS — C412 Malignant neoplasm of vertebral column: Secondary | ICD-10-CM | POA: Insufficient documentation

## 2015-12-26 DIAGNOSIS — M899 Disorder of bone, unspecified: Secondary | ICD-10-CM | POA: Insufficient documentation

## 2015-12-26 DIAGNOSIS — L89152 Pressure ulcer of sacral region, stage 2: Secondary | ICD-10-CM | POA: Diagnosis present

## 2015-12-26 DIAGNOSIS — L03115 Cellulitis of right lower limb: Secondary | ICD-10-CM | POA: Diagnosis present

## 2015-12-26 DIAGNOSIS — D61810 Antineoplastic chemotherapy induced pancytopenia: Secondary | ICD-10-CM | POA: Insufficient documentation

## 2015-12-26 DIAGNOSIS — E785 Hyperlipidemia, unspecified: Secondary | ICD-10-CM | POA: Diagnosis present

## 2015-12-26 DIAGNOSIS — Z79899 Other long term (current) drug therapy: Secondary | ICD-10-CM

## 2015-12-26 DIAGNOSIS — Z7901 Long term (current) use of anticoagulants: Secondary | ICD-10-CM | POA: Diagnosis not present

## 2015-12-26 DIAGNOSIS — D709 Neutropenia, unspecified: Secondary | ICD-10-CM | POA: Diagnosis present

## 2015-12-26 DIAGNOSIS — Z8249 Family history of ischemic heart disease and other diseases of the circulatory system: Secondary | ICD-10-CM | POA: Diagnosis not present

## 2015-12-26 DIAGNOSIS — I82401 Acute embolism and thrombosis of unspecified deep veins of right lower extremity: Secondary | ICD-10-CM | POA: Diagnosis present

## 2015-12-26 DIAGNOSIS — D708 Other neutropenia: Secondary | ICD-10-CM

## 2015-12-26 DIAGNOSIS — C8338 Diffuse large B-cell lymphoma, lymph nodes of multiple sites: Secondary | ICD-10-CM | POA: Diagnosis not present

## 2015-12-26 LAB — CBC WITH DIFFERENTIAL/PLATELET
BASOS ABS: 0 10*3/uL (ref 0.0–0.1)
Basophils Relative: 2 %
EOS ABS: 0 10*3/uL (ref 0.0–0.7)
Eosinophils Relative: 1 %
HCT: 38 % — ABNORMAL LOW (ref 39.0–52.0)
Hemoglobin: 12.5 g/dL — ABNORMAL LOW (ref 13.0–17.0)
LYMPHS ABS: 0.6 10*3/uL — AB (ref 0.7–4.0)
Lymphocytes Relative: 37 %
MCH: 27.7 pg (ref 26.0–34.0)
MCHC: 32.9 g/dL (ref 30.0–36.0)
MCV: 84.1 fL (ref 78.0–100.0)
Monocytes Absolute: 0.4 10*3/uL (ref 0.1–1.0)
Monocytes Relative: 28 %
NEUTROS ABS: 0.5 10*3/uL — AB (ref 1.7–7.7)
Neutrophils Relative %: 32 %
PLATELETS: 144 10*3/uL — AB (ref 150–400)
RBC: 4.52 MIL/uL (ref 4.22–5.81)
RDW: 16.7 % — ABNORMAL HIGH (ref 11.5–15.5)
WBC: 1.5 10*3/uL — ABNORMAL LOW (ref 4.0–10.5)

## 2015-12-26 LAB — COMPREHENSIVE METABOLIC PANEL
ALBUMIN: 4.1 g/dL (ref 3.5–5.0)
ALK PHOS: 74 U/L (ref 38–126)
ALT: 29 U/L (ref 17–63)
ANION GAP: 10 (ref 5–15)
AST: 35 U/L (ref 15–41)
BILIRUBIN TOTAL: 0.6 mg/dL (ref 0.3–1.2)
BUN: 12 mg/dL (ref 6–20)
CALCIUM: 9.4 mg/dL (ref 8.9–10.3)
CO2: 25 mmol/L (ref 22–32)
Chloride: 107 mmol/L (ref 101–111)
Creatinine, Ser: 0.98 mg/dL (ref 0.61–1.24)
GFR calc Af Amer: >60 mL/min (ref >60)
GLUCOSE: 110 mg/dL — AB (ref 65–99)
Potassium: 4.1 mmol/L (ref 3.5–5.1)
Sodium: 142 mmol/L (ref 135–145)
TOTAL PROTEIN: 7 g/dL (ref 6.5–8.1)

## 2015-12-26 LAB — URINALYSIS, ROUTINE W REFLEX MICROSCOPIC
Bilirubin Urine: NEGATIVE
GLUCOSE, UA: NEGATIVE mg/dL
HGB URINE DIPSTICK: NEGATIVE
KETONES UR: NEGATIVE mg/dL
LEUKOCYTES UA: NEGATIVE
Nitrite: NEGATIVE
PROTEIN: NEGATIVE mg/dL
Specific Gravity, Urine: 1.023 (ref 1.005–1.030)
pH: 6 (ref 5.0–8.0)

## 2015-12-26 LAB — I-STAT CG4 LACTIC ACID, ED: LACTIC ACID, VENOUS: 1.56 mmol/L (ref 0.5–2.0)

## 2015-12-26 MED ORDER — DEXTROSE 5 % IV SOLN
500.0000 mg | INTRAVENOUS | Status: DC
  Administered 2015-12-27 – 2015-12-28 (×2): 500 mg via INTRAVENOUS
  Filled 2015-12-26 (×2): qty 500

## 2015-12-26 MED ORDER — MIRTAZAPINE 15 MG PO TABS
15.0000 mg | ORAL_TABLET | Freq: Every day | ORAL | Status: DC | PRN
  Filled 2015-12-26: qty 1

## 2015-12-26 MED ORDER — POLYETHYLENE GLYCOL 3350 17 G PO PACK
17.0000 g | PACK | Freq: Every day | ORAL | Status: DC | PRN
  Filled 2015-12-26: qty 1

## 2015-12-26 MED ORDER — SODIUM CHLORIDE 0.9 % IV BOLUS (SEPSIS)
1000.0000 mL | Freq: Once | INTRAVENOUS | Status: AC
  Administered 2015-12-26: 1000 mL via INTRAVENOUS

## 2015-12-26 MED ORDER — DEXTROSE 5 % IV SOLN
2.0000 g | Freq: Three times a day (TID) | INTRAVENOUS | Status: DC
  Administered 2015-12-27 – 2015-12-29 (×7): 2 g via INTRAVENOUS
  Filled 2015-12-26 (×8): qty 2

## 2015-12-26 MED ORDER — ACETAMINOPHEN 325 MG PO TABS
650.0000 mg | ORAL_TABLET | Freq: Four times a day (QID) | ORAL | Status: DC | PRN
  Administered 2015-12-26 – 2015-12-28 (×4): 650 mg via ORAL
  Filled 2015-12-26 (×4): qty 2

## 2015-12-26 MED ORDER — DEXTROSE 5 % IV SOLN
1.0000 g | Freq: Once | INTRAVENOUS | Status: AC
  Administered 2015-12-26: 1 g via INTRAVENOUS
  Filled 2015-12-26: qty 10

## 2015-12-26 MED ORDER — APIXABAN 5 MG PO TABS
5.0000 mg | ORAL_TABLET | Freq: Two times a day (BID) | ORAL | Status: DC
Start: 2015-12-26 — End: 2015-12-29
  Administered 2015-12-27 – 2015-12-29 (×6): 5 mg via ORAL
  Filled 2015-12-26 (×9): qty 1

## 2015-12-26 MED ORDER — TEMAZEPAM 15 MG PO CAPS
15.0000 mg | ORAL_CAPSULE | Freq: Every evening | ORAL | Status: DC | PRN
Start: 2015-12-26 — End: 2015-12-29
  Administered 2015-12-27 – 2015-12-28 (×4): 15 mg via ORAL
  Filled 2015-12-26 (×4): qty 1

## 2015-12-26 MED ORDER — PROCHLORPERAZINE MALEATE 10 MG PO TABS
10.0000 mg | ORAL_TABLET | Freq: Four times a day (QID) | ORAL | Status: DC | PRN
  Filled 2015-12-26: qty 1

## 2015-12-26 MED ORDER — SENNOSIDES-DOCUSATE SODIUM 8.6-50 MG PO TABS
2.0000 | ORAL_TABLET | Freq: Every day | ORAL | Status: DC
  Administered 2015-12-27 – 2015-12-28 (×2): 2 via ORAL
  Filled 2015-12-26 (×2): qty 2

## 2015-12-26 MED ORDER — BACITRACIN-NEOMYCIN-POLYMYXIN 400-5-5000 EX OINT: 1.0000 "application " | TOPICAL_OINTMENT | CUTANEOUS | Status: DC | PRN

## 2015-12-26 MED ORDER — SIMVASTATIN 20 MG PO TABS
20.0000 mg | ORAL_TABLET | Freq: Every day | ORAL | Status: DC
  Administered 2015-12-27 – 2015-12-29 (×3): 20 mg via ORAL
  Filled 2015-12-26 (×4): qty 1

## 2015-12-26 MED ORDER — SODIUM CHLORIDE 0.9 % IV SOLN
INTRAVENOUS | Status: DC
  Administered 2015-12-27 – 2015-12-28 (×6): via INTRAVENOUS

## 2015-12-26 MED ORDER — DEXTROSE 5 % IV SOLN
500.0000 mg | Freq: Once | INTRAVENOUS | Status: AC
  Administered 2015-12-26: 500 mg via INTRAVENOUS
  Filled 2015-12-26: qty 500

## 2015-12-26 MED ORDER — DEXTROSE 5 % IV SOLN
2.0000 g | Freq: Once | INTRAVENOUS | Status: AC
  Administered 2015-12-27: 2 g via INTRAVENOUS
  Filled 2015-12-26: qty 2

## 2015-12-26 MED ORDER — PANTOPRAZOLE SODIUM 40 MG PO TBEC
80.0000 mg | DELAYED_RELEASE_TABLET | Freq: Every day | ORAL | Status: DC
  Administered 2015-12-27 – 2015-12-29 (×4): 80 mg via ORAL
  Filled 2015-12-26 (×4): qty 2

## 2015-12-26 NOTE — H&P (Signed)
[facility] History and Physical  Ethan Stewart [id number] DOB: 09/16/1946 DOA: 12/26/2015  Referring physician: EDP PCP:  Melinda Crutch, MD   Chief Complaint: Fever   HPI: Ethan Stewart is a 71 y.o. male who is 14 days post his last round of chemotherapy for DLBCL.  He was NOT given neulasta after this round of chemotherapy due to a history of Neulasta induced ARDS.  He presents to the ED with fever as high as 101.8 at home.  He called his oncologist who due to concern for possible febrile neutropenia sent the patient in to the ED (Dr. Grier Mitts telephone note is in chart).  Fever has improved with tylenol.  Work up in the ED is suggestive of febrile neutropenia with ANC of 500.  CXR is suspicious for possible CAP.  Review of Systems: Systems reviewed.  As above, otherwise negative  Past Medical History  Diagnosis Date  . History of bleeding ulcers 1990's  . 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 Anxiety   . GERD (gastroesophageal reflux disease)   . H. pylori infection   . History of blood transfusion   . Bone cancer ([place])      tumor on C5  . Arm numbness 08/07/15    Limited movement due to tumor on spine  . Diffuse large B cell lymphoma ([place])     biposy 07/25/2105   Past Surgical History  Procedure Laterality Date  . Hernia repair Bilateral     with mesh  . Colonoscopy    . Eye surgery Right     catheter  . Anterior cervical corpectomy N/A 07/26/2015    Procedure: Cervical five Corpectomy/Cervical four-six Fusion/Plate ;  Surgeon: Eustace Moore, MD;  Location: [place] [facility];  Service: Neurosurgery;  Laterality: N/A;  C5 Corpectomy/C4-6 Fusion/Plate C4-6   Social History:  reports that he quit smoking about 34 years ago. His smoking use included Cigarettes. He has a 10 pack-year smoking history. He has never used smokeless tobacco. He reports that he drinks about 3.0 oz of alcohol per week. He reports that he does not use illicit drugs.  Allergies  Allergen Reactions  .  Neulasta [Pegfilgrastim] Other (See Comments)    ARDS likely related to Neulasta    Family History  Problem Relation Age of Onset  . Hypertension Mother      Prior to Admission medications   Medication Sig Start Date End Date Taking? Authorizing Provider  acetaminophen (TYLENOL) 325 MG tablet Take 2 tablets (650 mg total) by mouth every 6 (six) hours as needed for mild pain, moderate pain, fever or headache (or Fever >/= 101). 09/05/15  Yes Modena Jansky, MD  apixaban (ELIQUIS) 5 MG TABS tablet Take 1 tablet (5 mg total) by mouth 2 (two) times daily. 09/29/15  Yes Charlynne Cousins, MD  mirtazapine (REMERON) 15 MG tablet Take 1 tablet (15 mg total) by mouth at bedtime. Might increase to 30mg  po HS in 7 days if still having insomnia Patient taking differently: Take 15 mg by mouth daily as needed (sleep).  10/09/15  Yes Brunetta Genera, MD  Multiple Vitamin (MULTIVITAMIN WITH MINERALS) TABS tablet Take 1 tablet by mouth daily.   Yes Historical Provider, MD  omeprazole (PRILOSEC) 40 MG capsule Take 1 capsule (40 mg total) by mouth daily. 10/09/15  Yes Brunetta Genera, MD  prochlorperazine (COMPAZINE) 10 MG tablet Take 1 tablet (10 mg total) by mouth every 6 (six) hours as needed (Nausea or vomiting). 08/08/15  Yes Brunetta Genera, MD  senna-docusate (SENNA S) 8.6-50 MG per tablet Take 2 tablets by mouth at bedtime. 09/11/15  Yes Brunetta Genera, MD  simvastatin (ZOCOR) 20 MG tablet Take 20 mg by mouth daily.   Yes Historical Provider, MD  temazepam (RESTORIL) 15 MG capsule Take 15 mg by mouth at bedtime and may repeat dose one time if needed.   Yes Historical Provider, MD  neomycin-bacitracin-polymyxin (NEOSPORIN) ointment Apply 1 application topically as needed for wound care (left foot blister).     Historical Provider, MD  polyethylene glycol (MIRALAX / GLYCOLAX) packet Take 17 g by mouth daily as needed for mild constipation.     Historical Provider, MD   Physical  Exam: Filed Vitals:   12/26/15 2045 12/26/15 2100  BP:  151/80  Pulse: 122 119  Temp:    Resp: 20 24    BP 151/80 mmHg  Pulse 119  Temp(Src) 98.4 F (36.9 C) (Oral)  Resp 24  Wt 99.338 kg (219 lb)  SpO2 97%  General Appearance:    Alert, oriented, no distress, appears stated age  Head:    Normocephalic, atraumatic  Eyes:    PERRL, EOMI, sclera non-icteric        Nose:   Nares without drainage or epistaxis. Mucosa, turbinates normal  Throat:   Moist mucous membranes. Oropharynx without erythema or exudate.  Neck:   Supple. No carotid bruits.  No thyromegaly.  No lymphadenopathy.   Back:     No CVA tenderness, no spinal tenderness  Lungs:     Clear to auscultation bilaterally, without wheezes, rhonchi or rales  Chest wall:    No tenderness to palpitation  Heart:    Regular rate and rhythm without murmurs, gallops, rubs  Abdomen:     Soft, non-tender, nondistended, normal bowel sounds, no organomegaly  Genitalia:    deferred  Rectal:    deferred  Extremities:   No clubbing, cyanosis or edema.  Pulses:   2+ and symmetric all extremities  Skin:   Skin color, texture, turgor normal, no rashes or lesions  Lymph nodes:   Cervical, supraclavicular, and axillary nodes normal  Neurologic:   CNII-XII intact. Normal strength, sensation and reflexes      throughout    Labs on Admission:  Basic Metabolic Panel:  Recent Labs Lab 12/26/15 1859  NA 142  K 4.1  CL 107  CO2 25  GLUCOSE 110*  BUN 12  CREATININE 0.98  CALCIUM 9.4   Liver Function Tests:  Recent Labs Lab 12/26/15 1859  AST 35  ALT 29  ALKPHOS 74  BILITOT 0.6  PROT 7.0  ALBUMIN 4.1   No results for input(s): LIPASE, AMYLASE in the last 168 hours. No results for input(s): AMMONIA in the last 168 hours. CBC:  Recent Labs Lab 12/26/15 1859  WBC 1.5*  NEUTROABS 0.5*  HGB 12.5*  HCT 38.0*  MCV 84.1  PLT 144*   Cardiac Enzymes: No results for input(s): CKTOTAL, CKMB, CKMBINDEX, TROPONINI in the last  168 hours.  BNP (last 3 results) No results for input(s): PROBNP in the last 8760 hours. CBG: No results for input(s): GLUCAP in the last 168 hours.  Radiological Exams on Admission: Dg Chest 2 View  12/26/2015  CLINICAL DATA:  Non-Hodgkin's lymphoma, fever EXAM: CHEST  2 VIEW COMPARISON:  09/19/2015 FINDINGS: Cardiomediastinal silhouette is stable. No pleural effusion. There is patchy right base medially atelectasis or infiltrate. No pulmonary edema. Right IJ Port-A-Cath with tip in distal  SVC. Osteopenia and mild degenerative changes thoracic spine. IMPRESSION: Patchy right base medially atelectasis or infiltrate. No pulmonary edema. Electronically Signed   By: Lahoma Crocker M.D.   On: 12/26/2015 19:42    EKG: Independently reviewed.  Assessment/Plan Principal Problem:   Febrile neutropenia ([facility]CC) Active Problems:   DLBCL (diffuse large B cell lymphoma) ([facility]CC)   1. Febrile neutropenia - possible CAP on CXR 1. Cultures pending 2. Cefepime 3. Adding azithromycin for possible CAP 4. Oncology to see patient tomorrow, but given probable history of Neulasta induced ARDS, Neulasta is contraindicated in this patient. 5. Protective isolation 6. IVF 7. Tele monitor for tachycardia 8. Tylenol PRN fever 2. DLBCL - had final round of chemo 14 days ago, no upcoming chemotherapy is currently scheduled or planned thankfully.  Although his last PET scan on Dec 6th prior to last round of chemo did show increase of hypermetabolism in one of the lymph nodes. 1. Oncology to see tomorrow as noted above.   Code Status: Full Code  Family Communication: Wife at bedside Disposition Plan: Admit to inpatient   Time spent: 30 min  GARDNER, JARED M. [facility] Pager [phone]  If 7AM-7PM, please contact the day team taking care of the patient [email or web address] Password [email or web address] 12/26/2015, 10:50 PM

## 2015-12-26 NOTE — ED Notes (Signed)
Pt states that he started feeling feverish tonight and had a 101.8 temp. Took 1g of tylenol at 5p. Tachycardic. Last chemo 12/19. Alert and oriented.

## 2015-12-26 NOTE — ED Provider Notes (Signed)
CSN: [id number]     Arrival date & time 12/26/15  1821 History   First MD Initiated Contact with Patient 12/26/15 1847     Chief Complaint  Patient presents with  . Cancer  . Fever      HPI Patient presents with history of diffuse large B-cell lymphoma.  Pt states that he started feeling feverish tonight and had a 101.8 temp. Took 1g of tylenol at 5p. Tachycardic. Last chemo 12/19.  Patient denies cough, sore throat, nausea or vomiting.  Patient denies abdominal pain.   Past Medical History  Diagnosis Date  . History of bleeding ulcers 1990's  . 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 Anxiety   . GERD (gastroesophageal reflux disease)   . H. pylori infection   . History of blood transfusion   . Bone cancer ([place])      tumor on C5  . Arm numbness 08/07/15    Limited movement due to tumor on spine  . Diffuse large B cell lymphoma ([place])     biposy 07/25/2105   Past Surgical History  Procedure Laterality Date  . Hernia repair Bilateral     with mesh  . Colonoscopy    . Eye surgery Right     catheter  . Anterior cervical corpectomy N/A 07/26/2015    Procedure: Cervical five Corpectomy/Cervical four-six Fusion/Plate ;  Surgeon: Eustace Moore, MD;  Location: [facility];  Service: Neurosurgery;  Laterality: N/A;  C5 Corpectomy/C4-6 Fusion/Plate C4-6   Family History  Problem Relation Age of Onset  . Hypertension Mother    Social History  Substance Use Topics  . Smoking status: Former Smoker -- 1.00 packs/day for 10 years    Types: Cigarettes    Quit date: 12/23/1981  . Smokeless tobacco: Never Used  . Alcohol Use: 3.0 oz/week    2 Glasses of wine, 3 Cans of beer per week    Review of Systems  Constitutional: Positive for fever.  All other systems reviewed and are negative.     Allergies  Neulasta  Home Medications   Prior to Admission medications   Medication Sig Start Date End Date Taking? Authorizing Provider  acetaminophen (TYLENOL) 325 MG tablet Take 2 tablets  (650 mg total) by mouth every 6 (six) hours as needed for mild pain, moderate pain, fever or headache (or Fever >/= 101). 09/05/15  Yes Modena Jansky, MD  apixaban (ELIQUIS) 5 MG TABS tablet Take 1 tablet (5 mg total) by mouth 2 (two) times daily. 09/29/15  Yes Charlynne Cousins, MD  mirtazapine (REMERON) 15 MG tablet Take 1 tablet (15 mg total) by mouth at bedtime. Might increase to 30mg  po HS in 7 days if still having insomnia Patient taking differently: Take 15 mg by mouth daily as needed (sleep).  10/09/15  Yes Brunetta Genera, MD  Multiple Vitamin (MULTIVITAMIN WITH MINERALS) TABS tablet Take 1 tablet by mouth daily.   Yes Historical Provider, MD  omeprazole (PRILOSEC) 40 MG capsule Take 1 capsule (40 mg total) by mouth daily. 10/09/15  Yes Brunetta Genera, MD  prochlorperazine (COMPAZINE) 10 MG tablet Take 1 tablet (10 mg total) by mouth every 6 (six) hours as needed (Nausea or vomiting). 08/08/15  Yes Brunetta Genera, MD  senna-docusate (SENNA S) 8.6-50 MG per tablet Take 2 tablets by mouth at bedtime. 09/11/15  Yes Brunetta Genera, MD  simvastatin (ZOCOR) 20 MG tablet Take 20 mg by mouth daily.   Yes Historical Provider, MD  temazepam (  RESTORIL) 15 MG capsule Take 15 mg by mouth at bedtime and may repeat dose one time if needed.   Yes Historical Provider, MD  neomycin-bacitracin-polymyxin (NEOSPORIN) ointment Apply 1 application topically as needed for wound care (left foot blister).     Historical Provider, MD  polyethylene glycol (MIRALAX / GLYCOLAX) packet Take 17 g by mouth daily as needed for mild constipation.     Historical Provider, MD   BP 151/80 mmHg  Pulse 119  Temp(Src) 98.4 F (36.9 C) (Oral)  Resp 24  Wt 219 lb (99.338 kg)  SpO2 97% Physical Exam Physical Exam  Nursing note and vitals reviewed. Constitutional: He is oriented to person, place, and time. He appears well-developed and well-nourished. No distress.  HENT:  Head: Normocephalic and atraumatic.   Eyes: Pupils are equal, round, and reactive to light.  Neck: Normal range of motion.  Cardiovascular: Normal rate and intact distal pulses.   Pulmonary/Chest: No respiratory distress.  Abdominal: Normal appearance. He exhibits no distension.  Musculoskeletal: Normal range of motion.  Neurological: He is alert and oriented to person, place, and time. No cranial nerve deficit.  Skin: Skin is warm and dry. No rash noted.    ED Course  Procedures (including critical care time) Medications  azithromycin (ZITHROMAX) 500 mg in dextrose 5 % 250 mL IVPB (not administered)  apixaban (ELIQUIS) tablet 5 mg (not administered)  mirtazapine (REMERON) tablet 15 mg (not administered)  acetaminophen (TYLENOL) tablet 650 mg (not administered)  neomycin-bacitracin-polymyxin (NEOSPORIN) ointment 1 application (not administered)  polyethylene glycol (MIRALAX / GLYCOLAX) packet 17 g (not administered)  prochlorperazine (COMPAZINE) tablet 10 mg (not administered)  pantoprazole (PROTONIX) EC tablet 80 mg (not administered)  simvastatin (ZOCOR) tablet 20 mg (not administered)  temazepam (RESTORIL) capsule 15 mg (not administered)  senna-docusate (Senokot-S) tablet 2 tablet (not administered)  ceFEPIme (MAXIPIME) 2 g in dextrose 5 % 50 mL IVPB (not administered)  ceFEPIme (MAXIPIME) 2 g in dextrose 5 % 50 mL IVPB (not administered)  sodium chloride 0.9 % bolus 1,000 mL (0 mLs Intravenous Stopped 12/26/15 2040)  cefTRIAXone (ROCEPHIN) 1 g in dextrose 5 % 50 mL IVPB (0 g Intravenous Stopped 12/26/15 2101)  azithromycin (ZITHROMAX) 500 mg in dextrose 5 % 250 mL IVPB (0 mg Intravenous Stopped 12/26/15 2131)    Labs Review Labs Reviewed  COMPREHENSIVE METABOLIC PANEL - Abnormal; Notable for the following:    Glucose, Bld 110 (*)    All other components within normal limits  CBC WITH DIFFERENTIAL/PLATELET - Abnormal; Notable for the following:    WBC 1.5 (*)    Hemoglobin 12.5 (*)    HCT 38.0 (*)    RDW 16.7 (*)     Platelets 144 (*)    Neutro Abs 0.5 (*)    Lymphs Abs 0.6 (*)    All other components within normal limits  CULTURE, BLOOD (ROUTINE X 2)  CULTURE, BLOOD (ROUTINE X 2)  URINE CULTURE  URINALYSIS, ROUTINE W REFLEX MICROSCOPIC (NOT AT [facility])  PATHOLOGIST SMEAR REVIEW  I-STAT CG4 LACTIC ACID, ED    Imaging Review Dg Chest 2 View  12/26/2015  CLINICAL DATA:  Non-Hodgkin's lymphoma, fever EXAM: CHEST  2 VIEW COMPARISON:  09/19/2015 FINDINGS: Cardiomediastinal silhouette is stable. No pleural effusion. There is patchy right base medially atelectasis or infiltrate. No pulmonary edema. Right IJ Port-A-Cath with tip in distal SVC. Osteopenia and mild degenerative changes thoracic spine. IMPRESSION: Patchy right base medially atelectasis or infiltrate. No pulmonary edema. Electronically Signed   By:  Lahoma Crocker M.D.   On: 12/26/2015 19:42   I have personally reviewed and evaluated these images and lab results as part of my medical decision-making.   EKG Interpretation   Date/Time:  Tuesday December 26 2015 18:48:25 EST Ventricular Rate:  121 PR Interval:  153 QRS Duration: 93 QT Interval:  320 QTC Calculation: 454 R Axis:   1 Text Interpretation:  Sinus tachycardia Low voltage, precordial leads  Abnormal ekg Confirmed by Audie Pinto  MD, Akiem Urieta ([id number]) on 12/26/2015 7:27:24  PM      MDM   Final diagnoses:  Fever, unspecified fever cause  Other neutropenia (HCC)        Leonard Schwartz, MD 12/26/15 2214

## 2015-12-26 NOTE — Progress Notes (Signed)
ANTIBIOTIC CONSULT NOTE - INITIAL  Pharmacy Consult for Cefepime Indication: Febrile Neutropenia  Allergies  Allergen Reactions  . Neulasta [Pegfilgrastim] Other (See Comments)    ARDS likely related to Neulasta    Patient Measurements: Weight: 219 lb (99.338 kg)  Vital Signs: Temp: 98.4 F (36.9 C) (01/03 2021) Temp Source: Oral (01/03 2021) BP: 151/80 mmHg (01/03 2100) Pulse Rate: 119 (01/03 2100) Intake/Output from previous day:   Intake/Output from this shift:    Labs:  Recent Labs  12/26/15 1859  WBC 1.5*  HGB 12.5*  PLT 144*  CREATININE 0.98   Estimated Creatinine Clearance: 88.2 mL/min (by C-G formula based on Cr of 0.98). No results for input(s): VANCOTROUGH, VANCOPEAK, VANCORANDOM, GENTTROUGH, GENTPEAK, GENTRANDOM, TOBRATROUGH, TOBRAPEAK, TOBRARND, AMIKACINPEAK, AMIKACINTROU, AMIKACIN in the last 72 hours.   Microbiology: No results found for this or any previous visit (from the past 720 hour(s)).  Medical History: Past Medical History  Diagnosis Date  . History of bleeding ulcers 1990's  . 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 Anxiety   . GERD (gastroesophageal reflux disease)   . H. pylori infection   . History of blood transfusion   . Bone cancer ([place])      tumor on C5  . Arm numbness 08/07/15    Limited movement due to tumor on spine  . Diffuse large B cell lymphoma ([place])     biposy 07/25/2105    Medications:  Scheduled:  . apixaban  5 mg Oral BID  . pantoprazole  80 mg Oral Daily  . senna-docusate  2 tablet Oral QHS  . simvastatin  20 mg Oral Daily  . temazepam  15 mg Oral QHS,MR X 1   Infusions:  . [START ON 12/27/2015] azithromycin     Assessment:  70 yr male with Non-Hodgkins lymphoma, undergoing R-CHOP treatment (last cycle received 12/11/15) with reported temp at home of 101.51F  Patient received Ceftriaxone 1gm and Azithromycin 500mg  IV x 1 dose each in ED ~ 20:30.  Pharmacy consulted to dose Cefepime for Febrile  Neutropenia  1/3 >>Ceftriaxone x 1 1/3 >>Azithromycin (per MD) >>  1/3 >> Cefepime >>   1/3 blood: 1/3 urine:  Goal of Therapy:  Eradication of infection  Plan:  Follow up culture results  Cefepime 2gm IV q8h  Keghan Mcfarren, Toribio Harbour, PharmD 12/26/2015,9:58 PM

## 2015-12-26 NOTE — Telephone Encounter (Signed)
I received a call from the after hours nurse Care line and then called [name]. He received his last cycle of R CHOP on 12/11/2015 and had normal counts on 12/19/2015. He developed high-grade fevers today with a MAXIMUM TEMPERATURE of 101.66F.  Note some sinus drainage and contact with his son who had a possible upper respiratory infection. Given that he is about 14 days out from his last cycle of chemotherapy that is clear concern that he might be neutropenic and that this is a neutropenic fever that would require hospitalization for septic workup and broad-spectrum IV antibiotics. He is not a candidate for G-CSF given previous capillary leak syndrome and ARDS.  He was recommended to go to the emergency room immediately for labs, septic workup likely inpatient hospitalization and empiric IV antibiotics. The patient should be admitted to the hospitalist service and I can certainly see the patient tomorrow morning.  Ethan Lone MD [place] AAHIVMS [facility] Hematology/Oncology Physician [place]  (Office):       [phone] (Work cell):  [phone] (Fax):           [phone]

## 2015-12-27 ENCOUNTER — Other Ambulatory Visit: Payer: Self-pay

## 2015-12-27 ENCOUNTER — Encounter (HOSPITAL_COMMUNITY): Payer: Self-pay | Admitting: *Deleted

## 2015-12-27 ENCOUNTER — Ambulatory Visit: Payer: PPO | Admitting: Hematology

## 2015-12-27 DIAGNOSIS — R509 Fever, unspecified: Secondary | ICD-10-CM | POA: Diagnosis present

## 2015-12-27 DIAGNOSIS — C8338 Diffuse large B-cell lymphoma, lymph nodes of multiple sites: Secondary | ICD-10-CM

## 2015-12-27 DIAGNOSIS — R5081 Fever presenting with conditions classified elsewhere: Secondary | ICD-10-CM

## 2015-12-27 DIAGNOSIS — L89152 Pressure ulcer of sacral region, stage 2: Secondary | ICD-10-CM

## 2015-12-27 DIAGNOSIS — K219 Gastro-esophageal reflux disease without esophagitis: Secondary | ICD-10-CM

## 2015-12-27 DIAGNOSIS — D709 Neutropenia, unspecified: Principal | ICD-10-CM

## 2015-12-27 DIAGNOSIS — L89159 Pressure ulcer of sacral region, unspecified stage: Secondary | ICD-10-CM

## 2015-12-27 LAB — CBC WITH DIFFERENTIAL/PLATELET
Basophils Absolute: 0 K/uL (ref 0.0–0.1)
Basophils Relative: 0 %
Eosinophils Absolute: 0 K/uL (ref 0.0–0.7)
Eosinophils Relative: 0 %
HCT: 31.8 % — ABNORMAL LOW (ref 39.0–52.0)
Hemoglobin: 11 g/dL — ABNORMAL LOW (ref 13.0–17.0)
Lymphocytes Relative: 17 %
Lymphs Abs: 0.5 K/uL — ABNORMAL LOW (ref 0.7–4.0)
MCH: 28 pg (ref 26.0–34.0)
MCHC: 34.6 g/dL (ref 30.0–36.0)
MCV: 80.9 fL (ref 78.0–100.0)
Monocytes Absolute: 0.9 K/uL (ref 0.1–1.0)
Monocytes Relative: 30 %
Neutro Abs: 1.7 K/uL (ref 1.7–7.7)
Neutrophils Relative %: 53 %
Platelets: 134 K/uL — ABNORMAL LOW (ref 150–400)
RBC: 3.93 MIL/uL — ABNORMAL LOW (ref 4.22–5.81)
RDW: 16.9 % — ABNORMAL HIGH (ref 11.5–15.5)
WBC: 3.1 K/uL — ABNORMAL LOW (ref 4.0–10.5)

## 2015-12-27 LAB — BASIC METABOLIC PANEL WITH GFR
Anion gap: 10 (ref 5–15)
BUN: 10 mg/dL (ref 6–20)
CO2: 23 mmol/L (ref 22–32)
Calcium: 8.7 mg/dL — ABNORMAL LOW (ref 8.9–10.3)
Chloride: 104 mmol/L (ref 101–111)
Creatinine, Ser: 0.96 mg/dL (ref 0.61–1.24)
GFR calc Af Amer: >60 mL/min
GFR calc non Af Amer: >60 mL/min
Glucose, Bld: 104 mg/dL — ABNORMAL HIGH (ref 65–99)
Potassium: 3.6 mmol/L (ref 3.5–5.1)
Sodium: 137 mmol/L (ref 135–145)

## 2015-12-27 LAB — DIFFERENTIAL
Basophils Absolute: 0 K/uL (ref 0.0–0.1)
Basophils Relative: 1 %
Eosinophils Absolute: 0 K/uL (ref 0.0–0.7)
Eosinophils Relative: 1 %
Lymphocytes Relative: 24 %
Lymphs Abs: 0.5 K/uL — ABNORMAL LOW (ref 0.7–4.0)
Monocytes Absolute: 0.7 K/uL (ref 0.1–1.0)
Monocytes Relative: 32 %
Neutro Abs: 0.9 K/uL — ABNORMAL LOW (ref 1.7–7.7)
Neutrophils Relative %: 42 %

## 2015-12-27 LAB — CBC
HEMATOCRIT: 34.3 % — AB (ref 39.0–52.0)
Hemoglobin: 11.5 g/dL — ABNORMAL LOW (ref 13.0–17.0)
MCH: 27.6 pg (ref 26.0–34.0)
MCHC: 33.5 g/dL (ref 30.0–36.0)
MCV: 82.3 fL (ref 78.0–100.0)
Platelets: 140 10*3/uL — ABNORMAL LOW (ref 150–400)
RBC: 4.17 MIL/uL — ABNORMAL LOW (ref 4.22–5.81)
RDW: 17 % — AB (ref 11.5–15.5)
WBC: 2.1 10*3/uL — ABNORMAL LOW (ref 4.0–10.5)

## 2015-12-27 LAB — URINE CULTURE: CULTURE: NO GROWTH

## 2015-12-27 LAB — PATHOLOGIST SMEAR REVIEW

## 2015-12-27 MED ORDER — SODIUM CHLORIDE 0.9 % IJ SOLN
10.0000 mL | INTRAMUSCULAR | Status: DC | PRN
  Administered 2015-12-27 – 2015-12-29 (×4): 10 mL
  Filled 2015-12-27 (×3): qty 40

## 2015-12-27 MED ORDER — ONDANSETRON HCL 4 MG/2ML IJ SOLN: 4.0000 mg | Freq: Three times a day (TID) | INTRAMUSCULAR | Status: AC | PRN

## 2015-12-27 MED ORDER — SODIUM CHLORIDE 0.9 % IV SOLN
INTRAVENOUS | Status: AC
Start: 2015-12-27 — End: 2015-12-27

## 2015-12-27 NOTE — Progress Notes (Signed)
[name]   HEMATOLOGY/ONCOLOGY INPATIENT PROGRESS NOTE  Date of Service: 12/27/2015  Inpatient Attending: .Cristal Ford, DO   SUBJECTIVE  I saw [name] this morning in the hospital. I received a call from him last night with symptoms of high-grade fever 101.45F with upper respiratory symptoms in the setting of likely neutropenia and had recommended that he come to the emergency room for evaluation and likely admission. I talked with emergency room doctors yesterday and recommended inpatient hospitalization with broad-spectrum antibiotics for neutropenic fever and a complete septic workup. Patient has been started on cefepime and azithromycin after finding of possible early pneumonia on his chest x-ray . This temperature curve seems to be improving. WBC counts went up from 1.5K to 2.1K. [place] not available from today but was 500 yesterday. He notes that his son had an upper respiratory infection and he himself has nasal stuffiness.  Notes that he is feeling somewhat flushed but otherwise no other acute focal symptoms. Breathing is stable. No chest pain. No new palpable lymph nodes.   He is due to see Dr. Lisbeth Renshaw on Monday.no acute distress    OBJECTIVE:  NAD  PHYSICAL EXAMINATION: . Filed Vitals:   12/27/15 0000 12/27/15 0004 12/27/15 0055 12/27/15 0518  BP: 118/66 118/66 159/68 134/77  Pulse: 119 117 120 115  Temp:  100 F (37.8 C) 100.2 F (37.9 C) 100.8 F (38.2 C)  TempSrc:  Oral Oral Oral  Resp:  '24 20 20  ' Height:   '6\' 1"'  (1.854 m)   Weight:   219 lb 9.3 oz (99.6 kg)   SpO2: 94% 94% 97% 97%   Filed Weights   12/26/15 1940 12/27/15 0055  Weight: 219 lb (99.338 kg) 219 lb 9.3 oz (99.6 kg)   .Body mass index is 28.98 kg/(m^2).  GENERAL:alert, in no acute distress and comfortable, appear somewhat flushed. SKIN: skin color, texture, turgor are normal, no rashes or significant lesions, sacral decubitus ulcer is progressively healing. No active discharge. No significant erythema  around the area . EYES: normal, conjunctiva are pink and non-injected, sclera clear OROPHARYNX:no exudate, no erythema and lips, buccal mucosa, and tongue normal  NECK: supple, no JVD, thyroid normal size, non-tender, without nodularity LYMPH:  no palpable lymphadenopathy in the cervical, axillary or inguinal LUNGS: clear to auscultation with normal respiratory effort. Port present right upper chest wall. No evidence of redness fluctuance or induration around the port HEART: regular rate & rhythm,  no murmurs and no lower extremity edema ABDOMEN: abdomen soft, non-tender, normoactive bowel sounds  Musculoskeletal: no cyanosis of digits and no clubbing  PSYCH: alert & oriented x 3 with fluent speech NEURO: no focal motor/sensory deficits  MEDICAL HISTORY:  Past Medical History  Diagnosis Date  . History of bleeding ulcers 1990's  . 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 Anxiety   . GERD (gastroesophageal reflux disease)   . H. pylori infection   . History of blood transfusion   . Bone cancer ([place])      tumor on C5  . Arm numbness 08/07/15    Limited movement due to tumor on spine  . Diffuse large B cell lymphoma ([place])     biposy 07/25/2105    SURGICAL HISTORY: Past Surgical History  Procedure Laterality Date  . Hernia repair Bilateral     with mesh  . Colonoscopy    . Eye surgery Right     catheter  . Anterior cervical corpectomy N/A 07/26/2015    Procedure: Cervical five Corpectomy/Cervical four-six Fusion/Plate ;  Surgeon: Eustace Moore, MD;  Location: [facility] ORS;  Service: Neurosurgery;  Laterality: N/A;  C5 Corpectomy/C4-6 Fusion/Plate C4-6    SOCIAL HISTORY: Social History   Social History  . Marital Status: Married    Spouse Name: N/A  . Number of Children: N/A  . Years of Education: N/A   Occupational History  . Not on file.   Social History Main Topics  . Smoking status: Former Smoker -- 1.00 packs/day for 10 years    Types: Cigarettes    Quit date: 12/23/1981   . Smokeless tobacco: Never Used  . Alcohol Use: 3.0 oz/week    2 Glasses of wine, 3 Cans of beer per week  . Drug Use: No  . Sexual Activity: Yes   Other Topics Concern  . Not on file   Social History Narrative    FAMILY HISTORY: Family History  Problem Relation Age of Onset  . Hypertension Mother     ALLERGIES:  is allergic to neulasta.  MEDICATIONS:  Scheduled Meds: . sodium chloride   Intravenous STAT  . apixaban  5 mg Oral BID  . azithromycin  500 mg Intravenous Q24H  . ceFEPime (MAXIPIME) IV  2 g Intravenous Q8H  . pantoprazole  80 mg Oral Daily  . senna-docusate  2 tablet Oral QHS  . simvastatin  20 mg Oral Daily  . temazepam  15 mg Oral QHS,MR X 1   Continuous Infusions: . sodium chloride 125 mL/hr at 12/27/15 0825   PRN Meds:.acetaminophen, mirtazapine, neomycin-bacitracin-polymyxin, ondansetron (ZOFRAN) IV, polyethylene glycol, prochlorperazine, sodium chloride  REVIEW OF SYSTEMS:    10 Point review of Systems was done is negative except as noted above.   LABORATORY DATA:  I have reviewed the data as listed  . CBC Latest Ref Rng 12/27/2015 12/26/2015 12/19/2015  WBC 4.0 - 10.5 K/uL 2.1(L) 1.5(L) 2.9(L)  Hemoglobin 13.0 - 17.0 g/dL 11.5(L) 12.5(L) 12.6(L)  Hematocrit 39.0 - 52.0 % 34.3(L) 38.0(L) 38.0(L)  Platelets 150 - 400 K/uL 140(L) 144(L) 156    . CMP Latest Ref Rng 12/27/2015 12/26/2015 12/19/2015  Glucose 65 - 99 mg/dL 104(H) 110(H) 88  BUN 6 - 20 mg/dL 10 12 16.7  Creatinine 0.61 - 1.24 mg/dL 0.96 0.98 0.9  Sodium 135 - 145 mmol/L 137 142 142  Potassium 3.5 - 5.1 mmol/L 3.6 4.1 3.8  Chloride 101 - 111 mmol/L 104 107 -  CO2 22 - 32 mmol/L '23 25 24  ' Calcium 8.9 - 10.3 mg/dL 8.7(L) 9.4 9.2  Total Protein 6.5 - 8.1 g/dL - 7.0 6.3(L)  Total Bilirubin 0.3 - 1.2 mg/dL - 0.6 <0.30  Alkaline Phos 38 - 126 U/L - 74 71  AST 15 - 41 U/L - 35 19  ALT 17 - 63 U/L - 29 25     RADIOGRAPHIC STUDIES: I have personally reviewed the radiological images as  listed and agreed with the findings in the report. Dg Chest 2 View  12/26/2015  CLINICAL DATA:  Non-Hodgkin's lymphoma, fever EXAM: CHEST  2 VIEW COMPARISON:  09/19/2015 FINDINGS: Cardiomediastinal silhouette is stable. No pleural effusion. There is patchy right base medially atelectasis or infiltrate. No pulmonary edema. Right IJ Port-A-Cath with tip in distal SVC. Osteopenia and mild degenerative changes thoracic spine. IMPRESSION: Patchy right base medially atelectasis or infiltrate. No pulmonary edema. Electronically Signed   By: Lahoma Crocker M.D.   On: 12/26/2015 19:42   Nm Pet Image Restag (ps) Skull Base To Thigh  11/28/2015  CLINICAL DATA:  Subsequent  treatment strategy for diffuse large B-cell lymphoma. EXAM: NUCLEAR MEDICINE PET SKULL BASE TO THIGH TECHNIQUE: 11.6 mCi F-18 FDG was injected intravenously. Full-ring PET imaging was performed from the skull base to thigh after the radiotracer. CT data was obtained and used for attenuation correction and anatomic localization. FASTING BLOOD GLUCOSE:  Value: 80 mg/dl COMPARISON:  11/10/2015 PET-CT. FINDINGS: NECK No hypermetabolic lymph nodes in the neck. CHEST Stable 4 mm solid subpleural right lower lobe pulmonary nodule (series 6/image 42), without metabolic activity. Stable 4 mm medial right lower lobe pulmonary nodule (6/27), without metabolic activity. There is a mildly hypermetabolic 2.9 x 1.2 cm basilar right lower lobe pulmonary nodule (6/54) with max SUV 4.2, unchanged in size with previous max SUV 4.8, slightly decreased. No acute consolidative airspace disease or new significant pulmonary nodules. No hypermetabolic axillary, mediastinal or hilar nodes. Right internal jugular MediPort terminates in the lower third of the superior vena cava. Left anterior descending and right coronary atherosclerosis. ABDOMEN/PELVIS There is a mildly enlarged hypermetabolic 1.0 cm left mesenteric lymph node (series 4/image 152) with max SUV 12.7, previously 1.0 cm  with max SUV 11.8, slightly increased. No additional hypermetabolic nodes in the abdomen or pelvis. No abnormal hypermetabolic activity within the liver, pancreas, adrenal glands, or spleen. Re- demonstrated is pneumobilia throughout the intrahepatic and extrahepatic bile ducts and within the gallbladder, presumably from prior sphincterotomy. No biliary ductal dilatation. Stable parapelvic renal cysts in the left greater than right renal sinuses. Stable mild diffuse wall thickening and trabeculation in the bladder, suggesting chronic bladder outlet obstruction by the mildly enlarged prostate. Re- demonstrated is skin thickening and associated subcutaneous fat stranding overlying the coccyx and lower sacrum with max SUV 5.6, previously 5.9, not appreciably changed. No associated sacrococcygeal bone lesion. SKELETON The patient is status post C5 corpectomy and anterior cervical spine fusion from C4-C6, with associated mild hypermetabolism with max SUV 5.5, previously 4.9, not appreciably changed. There is focal hypermetabolism between the L3 and L4 spinous processes with max SUV 3.6, previously 3.8, not appreciably changed. IMPRESSION: 1. Slightly increased hypermetabolism within the mildly enlarged hypermetabolic left mesenteric lymph node. 2. Slightly decreased hypermetabolism within the basilar right lower lobe 2.9 cm pulmonary nodule, which is indeterminate and could represent resolving lung infection/inflammation, less likely pulmonary lymphoma. Two additional stable 4 mm right lower lobe pulmonary nodules, which are below PET resolution. 3. Stable mild focal hypermetabolism between the L3 and L4 spinous processes, nonspecific. 4. Stable mild hypermetabolism associated with postsurgical changes in the lower cervical spine. 5. Stable hypermetabolism in the superficial soft tissues overlying the coccyx and lower sacrum, most in keeping with cellulitis. 6. No new sites of hypermetabolic lymphoma. Electronically  Signed   By: Ilona Sorrel M.D.   On: 11/28/2015 09:07    ASSESSMENT & PLAN:   70 year old gentleman with a history of stage IV AE diffuse large B-cell lymphoma status post 6 cycles of R CHOP with the last one being on 12/11/2015 admitted with neutropenic fevers  #1 Neutropenic fevers with chest x-ray and possible early right basal infiltrate concerning for pneumonia. His ANC has gone up from 500 yesterday to 900 today suggesting a recovering marrow after his last chemotherapy. Other elements of risk would be his sacral decubitus ulcer which is healing well and has no overt signs of infection. She doesn't report but there is no overt sign of local or site infection. Appears to have upper respiratory symptoms and notes contact with his son who had an upper respiratory  tract infection. Plan -Continue cefepime and azithromycin as per hospitalist team. -Low threshold for adding vancomycin if patient continues despite given his sacral decubitus ulcer and presence of IV port. -Respiratory viral panel. -Monitor CBC with differential daily. -Cannot use G-CSF given his previous history of capillary leak syndrome and ARDS. -Would continue IV antibiotics at least to his [place] is more than 1000 for 2 days and blood culture results are available and patient defervesces and then then might transition to oral antibiotics for discharge home.  2) Diffuse large B-cell lymphoma stage IV AE with involvement of mesenteric, inguinal lymph nodes and C5 vertebral body with spinal cord impingement.  He had a C5 corpectomy on 07/26/2015 that showed diffuse large B-cell lymphoma with a Ki-67 ranging from 20 to 90%. Cytogenetics and Fish showed BCL 2 positivity but negative for BCL 6 and cMYC CD10 positivity suggestive of possibly GCB subtype. Plan -Patient has completed his 6 cycles of R CHOP. -We'll follow up with Dr. Lisbeth Renshaw as per his appointment on next Monday to consider consolidative radiation to his C5 lesion and FDG  avid mesenteric lymph node. -We'll follow up with me in clinic in 2-3 weeks with repeat labs. -Discussed with patient and wife today we will likely decided to hold off on further intrathecal methotrexate for CNS prophylaxis. -Continue outpatient physical therapy for right upper extremity weakness and strength building. 2)Acid reflux and gastritis likely related to steroid use -controlled -Continue PPI  3) Decubitus Ulcer over sacral area -present on admission .improving with optimized nutrition and increased ambulation and hopefully now that the patient is done with chemotherapy. Continue follow-up with wound care clinic   4)right lower extremity distal DVT with mild cellulitis. Cellulitis resolved. Patient tolerating Elliquis. Plan -We'll complete 6 months of anticoagulation until mid March 2017. Would hold if platelet counts less than 50,000 or if bleeding.  I spent 30 minutes counseling the patient face to face. The total time spent in the appointment was 35 minutes and more than 50% was on counseling and direct patient cares.    Sullivan Lone MD [place] AAHIVMS [facility] Hematology/Oncology Physician [facility]  (Office):       [phone] (Work cell):  [phone] (Fax):           [phone]  12/27/2015 9:35 AM

## 2015-12-27 NOTE — Care Management Note (Signed)
Case Management Note  Patient Details  Name: QUENTRELL RODEMAN MRN: [id number] Date of Birth: August 26, 1946  Subjective/Objective: 70 y/o m admitted w/Febrile neutropenia. Admitting-Corrected insurance to show [employer] as Primary.TF:[id number] [place]. From home.                   Action/Plan:d/c plan home.   Expected Discharge Date:                  Expected Discharge Plan:  Home/Self Care  In-House Referral:     Discharge planning Services  CM Consult  Post Acute Care Choice:    Choice offered to:     DME Arranged:    DME Agency:     HH Arranged:    HH Agency:     Status of Service:  In process, will continue to follow  Medicare Important Message Given:    Date Medicare IM Given:    Medicare IM give by:    Date Additional Medicare IM Given:    Additional Medicare Important Message give by:     If discussed at [place] of Stay Meetings, dates discussed:    Additional Comments:  Dessa Phi, RN 12/27/2015, 11:49 AM

## 2015-12-27 NOTE — Progress Notes (Signed)
MD notified of Temp 103.1 Pulse 114 and respirations 21.  Tylenol given. Andre Lefort

## 2015-12-27 NOTE — Progress Notes (Signed)
[facility]                                                                              Patient Demographics  Ethan Stewart, is a 70 y.o. male, DOB - [birth year], [id number]  Admit date - 12/26/2015   Admitting Physician Etta Quill, DO  Outpatient Primary MD for the patient is  Melinda Crutch, MD  LOS - 1   Chief Complaint  Patient presents with  . Cancer  . Fever      HPI 12/26/2015 by Dr. Jennette Kettle Ethan Stewart is a 69 y.o. male who is 14 days post his last round of chemotherapy for DLBCL. He was NOT given neulasta after this round of chemotherapy due to a history of Neulasta induced ARDS. He presents to the ED with fever as high as 101.8 at home. He called his oncologist who due to concern for possible febrile neutropenia sent the patient in to the ED (Dr. Grier Mitts telephone note is in chart). Fever has improved with tylenol. Work up in the ED is suggestive of febrile neutropenia with ANC of 500. CXR is suspicious for possible CAP.  Assessment & Plan   Febrile Neutropenia -?CAP on CXR -Continue azithromycin and cefepime -Blood cultures pending -Continue to monitor CBC, ANC 900, per oncology would like for ANC to be over 1000 for atleast 2 days before discharge -Continue neutropenic precautions -Continue Tylenol as needed -Of note, patient has had history of Neulasta induced ARDS -Pending respiratory viral panel  Diffuse large B-cell lymphoma -Patient received chemotherapy partially 14 days ago -Oncology consulted and appreciated  Sacral decubitus ulcer, stage II -Appears to be healing well, continue wound care -Does not appear to be infected  Right lower extremity DVT -Continue Eliquis  Hyperlipidemia -Continue statin  Code Status: Full  Family Communication: Wife at bedside  Disposition Plan: Admitted.  Continue treatment for febrile neutropenia  Time Spent in minutes   30 minutes  Procedures  None  Consults   Oncology  DVT  Prophylaxis  Eliquis  Lab Results  Component Value Date   PLT 140* 12/27/2015    Medications  Scheduled Meds: . apixaban  5 mg Oral BID  . azithromycin  500 mg Intravenous Q24H  . ceFEPime (MAXIPIME) IV  2 g Intravenous Q8H  . pantoprazole  80 mg Oral Daily  . senna-docusate  2 tablet Oral QHS  . simvastatin  20 mg Oral Daily  . temazepam  15 mg Oral QHS,MR X 1   Continuous Infusions: . sodium chloride 125 mL/hr at 12/27/15 0825   PRN Meds:.acetaminophen, mirtazapine, neomycin-bacitracin-polymyxin, polyethylene glycol, prochlorperazine, sodium chloride  Antibiotics    Anti-infectives    Start     Dose/Rate Route Frequency Ordered Stop   12/27/15 2100  azithromycin (ZITHROMAX) 500 mg in dextrose 5 % 250 mL IVPB     500 mg 250 mL/hr over 60 Minutes Intravenous Every 24 hours 12/26/15 2141     12/27/15 0700  ceFEPIme (MAXIPIME) 2 g in dextrose 5 % 50 mL IVPB     2 g 100 mL/hr over 30 Minutes Intravenous Every 8 hours 12/26/15 2213     12/26/15 2230  ceFEPIme (MAXIPIME) 2  g in dextrose 5 % 50 mL IVPB     2 g 100 mL/hr over 30 Minutes Intravenous  Once 12/26/15 2213 12/27/15 0033   12/26/15 2030  cefTRIAXone (ROCEPHIN) 1 g in dextrose 5 % 50 mL IVPB     1 g 100 mL/hr over 30 Minutes Intravenous  Once 12/26/15 2016 12/26/15 2101   12/26/15 2030  azithromycin (ZITHROMAX) 500 mg in dextrose 5 % 250 mL IVPB     500 mg 250 mL/hr over 60 Minutes Intravenous  Once 12/26/15 2016 12/26/15 2131      Subjective:   Ethan Stewart seen and examined today. Patient states he is feeling mildly better. Denies feeling feverish at this time. Denies any chest pain, shortness of breath, abdominal pain, nausea or vomiting. Has had some cough.  Objective:   Filed Vitals:   12/27/15 0000 12/27/15 0004 12/27/15 0055 12/27/15 0518  BP: 118/66 118/66 159/68 134/77  Pulse: 119 117 120 115  Temp:  100 F (37.8 C) 100.2 F (37.9 C) 100.8 F (38.2 C)  TempSrc:  Oral Oral Oral  Resp:  24 20 20     Height:   6\' 1"  (1.854 m)   Weight:   99.6 kg (219 lb 9.3 oz)   SpO2: 94% 94% 97% 97%    Wt Readings from Last 3 Encounters:  12/27/15 99.6 kg (219 lb 9.3 oz)  12/04/15 99.61 kg (219 lb 9.6 oz)  12/04/15 99.066 kg (218 lb 6.4 oz)     Intake/Output Summary (Last 24 hours) at 12/27/15 1401 Last data filed at 12/27/15 0700  Gross per 24 hour  Intake 1158.75 ml  Output    300 ml  Net 858.75 ml    Exam  General: Well developed, well nourished, NAD, appears stated age  HEENT: NCAT,  mucous membranes moist.   Cardiovascular: S1 S2 auscultated, no rubs, murmurs or gallops. Regular rate and rhythm.  Respiratory: Clear to auscultation bilaterally with equal chest rise  Abdomen: Soft, nontender, nondistended, + bowel sounds  Extremities: warm dry without cyanosis clubbing or edema  Neuro: AAOx3, nonfocal  Psych: Normal affect and demeanor    Data Review   Micro Results Recent Results (from the past 240 hour(s))  Culture, blood (routine x 2)     Status: None (Preliminary result)   Collection Time: 12/26/15  6:55 PM  Result Value Ref Range Status   Specimen Description BLOOD LEFT ANTECUBITAL  Final   Special Requests BOTTLES DRAWN AEROBIC AND ANAEROBIC 5CC EACH  Final   Culture   Final    NO GROWTH < 24 HOURS Performed at [facility]    Report Status PENDING  Incomplete  Urine culture     Status: None (Preliminary result)   Collection Time: 12/26/15  6:58 PM  Result Value Ref Range Status   Specimen Description URINE, CLEAN CATCH  Final   Special Requests NONE  Final   Culture   Final    NO GROWTH < 24 HOURS Performed at [facility]    Report Status PENDING  Incomplete  Culture, blood (routine x 2)     Status: None (Preliminary result)   Collection Time: 12/26/15  7:44 PM  Result Value Ref Range Status   Specimen Description BLOOD RIGHT ARM  Final   Special Requests BOTTLES DRAWN AEROBIC AND ANAEROBIC 5CC  Final   Culture   Final    NO GROWTH  < 24 HOURS Performed at [facility]    Report Status PENDING  Incomplete    Radiology Reports Dg Chest 2 View  12/26/2015  CLINICAL DATA:  Non-Hodgkin's lymphoma, fever EXAM: CHEST  2 VIEW COMPARISON:  09/19/2015 FINDINGS: Cardiomediastinal silhouette is stable. No pleural effusion. There is patchy right base medially atelectasis or infiltrate. No pulmonary edema. Right IJ Port-A-Cath with tip in distal SVC. Osteopenia and mild degenerative changes thoracic spine. IMPRESSION: Patchy right base medially atelectasis or infiltrate. No pulmonary edema. Electronically Signed   By: Lahoma Crocker M.D.   On: 12/26/2015 19:42   Nm Pet Image Restag (ps) Skull Base To Thigh  11/28/2015  CLINICAL DATA:  Subsequent treatment strategy for diffuse large B-cell lymphoma. EXAM: NUCLEAR MEDICINE PET SKULL BASE TO THIGH TECHNIQUE: 11.6 mCi F-18 FDG was injected intravenously. Full-ring PET imaging was performed from the skull base to thigh after the radiotracer. CT data was obtained and used for attenuation correction and anatomic localization. FASTING BLOOD GLUCOSE:  Value: 80 mg/dl COMPARISON:  11/10/2015 PET-CT. FINDINGS: NECK No hypermetabolic lymph nodes in the neck. CHEST Stable 4 mm solid subpleural right lower lobe pulmonary nodule (series 6/image 42), without metabolic activity. Stable 4 mm medial right lower lobe pulmonary nodule (6/27), without metabolic activity. There is a mildly hypermetabolic 2.9 x 1.2 cm basilar right lower lobe pulmonary nodule (6/54) with max SUV 4.2, unchanged in size with previous max SUV 4.8, slightly decreased. No acute consolidative airspace disease or new significant pulmonary nodules. No hypermetabolic axillary, mediastinal or hilar nodes. Right internal jugular MediPort terminates in the lower third of the superior vena cava. Left anterior descending and right coronary atherosclerosis. ABDOMEN/PELVIS There is a mildly enlarged hypermetabolic 1.0 cm left mesenteric lymph node  (series 4/image 152) with max SUV 12.7, previously 1.0 cm with max SUV 11.8, slightly increased. No additional hypermetabolic nodes in the abdomen or pelvis. No abnormal hypermetabolic activity within the liver, pancreas, adrenal glands, or spleen. Re- demonstrated is pneumobilia throughout the intrahepatic and extrahepatic bile ducts and within the gallbladder, presumably from prior sphincterotomy. No biliary ductal dilatation. Stable parapelvic renal cysts in the left greater than right renal sinuses. Stable mild diffuse wall thickening and trabeculation in the bladder, suggesting chronic bladder outlet obstruction by the mildly enlarged prostate. Re- demonstrated is skin thickening and associated subcutaneous fat stranding overlying the coccyx and lower sacrum with max SUV 5.6, previously 5.9, not appreciably changed. No associated sacrococcygeal bone lesion. SKELETON The patient is status post C5 corpectomy and anterior cervical spine fusion from C4-C6, with associated mild hypermetabolism with max SUV 5.5, previously 4.9, not appreciably changed. There is focal hypermetabolism between the L3 and L4 spinous processes with max SUV 3.6, previously 3.8, not appreciably changed. IMPRESSION: 1. Slightly increased hypermetabolism within the mildly enlarged hypermetabolic left mesenteric lymph node. 2. Slightly decreased hypermetabolism within the basilar right lower lobe 2.9 cm pulmonary nodule, which is indeterminate and could represent resolving lung infection/inflammation, less likely pulmonary lymphoma. Two additional stable 4 mm right lower lobe pulmonary nodules, which are below PET resolution. 3. Stable mild focal hypermetabolism between the L3 and L4 spinous processes, nonspecific. 4. Stable mild hypermetabolism associated with postsurgical changes in the lower cervical spine. 5. Stable hypermetabolism in the superficial soft tissues overlying the coccyx and lower sacrum, most in keeping with cellulitis. 6. No  new sites of hypermetabolic lymphoma. Electronically Signed   By: Ilona Sorrel M.D.   On: 11/28/2015 09:07    CBC  Recent Labs Lab 12/26/15 1859 12/27/15 0558  WBC 1.5* 2.1*  HGB 12.5* 11.5*  HCT 38.0* 34.3*  PLT 144* 140*  MCV 84.1 82.3  MCH 27.7 27.6  MCHC 32.9 33.5  RDW 16.7* 17.0*  LYMPHSABS 0.6* 0.5*  MONOABS 0.4 0.7  EOSABS 0.0 0.0  BASOSABS 0.0 0.0    Chemistries   Recent Labs Lab 12/26/15 1859 12/27/15 0558  NA 142 137  K 4.1 3.6  CL 107 104  CO2 25 23  GLUCOSE 110* 104*  BUN 12 10  CREATININE 0.98 0.96  CALCIUM 9.4 8.7*  AST 35  --   ALT 29  --   ALKPHOS 74  --   BILITOT 0.6  --    ------------------------------------------------------------------------------------------------------------------ estimated creatinine clearance is 90.2 mL/min (by C-G formula based on Cr of 0.96). ------------------------------------------------------------------------------------------------------------------ No results for input(s): HGBA1C in the last 72 hours. ------------------------------------------------------------------------------------------------------------------ No results for input(s): CHOL, HDL, LDLCALC, TRIG, CHOLHDL, LDLDIRECT in the last 72 hours. ------------------------------------------------------------------------------------------------------------------ No results for input(s): TSH, T4TOTAL, T3FREE, THYROIDAB in the last 72 hours.  Invalid input(s): FREET3 ------------------------------------------------------------------------------------------------------------------ No results for input(s): VITAMINB12, FOLATE, FERRITIN, TIBC, IRON, RETICCTPCT in the last 72 hours.  Coagulation profile No results for input(s): INR, PROTIME in the last 168 hours.  No results for input(s): DDIMER in the last 72 hours.  Cardiac Enzymes No results for input(s): CKMB, TROPONINI, MYOGLOBIN in the last 168 hours.  Invalid input(s):  CK ------------------------------------------------------------------------------------------------------------------ Invalid input(s): POCBNP    Katilynn Sinkler D.O. on 12/27/2015 at 2:01 PM  Between 7am to 7pm - Pager - [phone]  After 7pm go to [email or web address] - password [email or web address]  And look for the night coverage person covering for me after hours  [facility] Office  [phone]

## 2015-12-28 DIAGNOSIS — L8990 Pressure ulcer of unspecified site, unspecified stage: Secondary | ICD-10-CM

## 2015-12-28 DIAGNOSIS — C8330 Diffuse large B-cell lymphoma, unspecified site: Secondary | ICD-10-CM

## 2015-12-28 LAB — BASIC METABOLIC PANEL
ANION GAP: 9 (ref 5–15)
BUN: 12 mg/dL (ref 6–20)
CHLORIDE: 106 mmol/L (ref 101–111)
CO2: 23 mmol/L (ref 22–32)
Calcium: 9 mg/dL (ref 8.9–10.3)
Creatinine, Ser: 1.2 mg/dL (ref 0.61–1.24)
GFR calc Af Amer: >60 mL/min (ref >60)
GFR, EST NON AFRICAN AMERICAN: 60 mL/min — AB (ref >60)
GLUCOSE: 94 mg/dL (ref 65–99)
POTASSIUM: 3.6 mmol/L (ref 3.5–5.1)
Sodium: 138 mmol/L (ref 135–145)

## 2015-12-28 NOTE — Progress Notes (Signed)
[facility] PROGRESS NOTE  DEAKYN STOLZENBURG [id number] DOB: 1946-11-19 DOA: 12/26/2015 PCP:  Melinda Crutch, MD  Brief narrative 70 year old male with diffuse large B-cell lymphoma on chemotherapy (last chemotherapy 14 days prior to admission), right lower leg DVT on and I correlation presented to the ED with fever of 101.58F at home. He called his oncologist and patient was hostile go to the ED with concern for febrile neutropenia. Patient has not been given Neulasta after chemotherapy due to history of Neulasta induced ARDS. In the ED workup showed neutropenia with ANC of 500. Chest x-ray was suggestive of right middle lobe pneumonia. Admitted to hospitalist service.  Assessment/Plan: Febrile neutropenia. Possible right middle lobe pneumonia on chest x-ray. On empiric cefepime and azithromycin. Cultures have been negative. Remains febrile with a max of 103F overnight. -ANC improving (1643). Oncology recommended in situ be >1000 for at least 2 days before discharging. -Supportive care with Tylenol.  Diffuse large B-cell lymphoma Receiving chemotherapy as outpatient. Oncology following.  Sacral decubitus ulcer stage II Appears clean on exam and healing. Wound care consult appreciated.  Right lower leg DVT On eliquis  Hyperlipidemia Continue statin  Diet: Regular DVT prophylaxis: On the correlation  Code Status: Full code Family Communication: None at bedside Disposition Plan: Home once afebrile and ANC >1000   Consultants:  Dr. Irene Limbo  Procedures:  None  Antibiotics:  IV cefepime and azithromycin  HPI/Subjective: Seen and examined. MAXIMUM TEMPERATURE of 103F overnight. Patient denies any symptoms.  Objective: Filed Vitals:   12/28/15 0714 12/28/15 1500  BP:  119/54  Pulse:  88  Temp: 97.8 F (36.6 C) 99.5 F (37.5 C)  Resp:  18    Intake/Output Summary (Last 24 hours) at 12/28/15 1548 Last data filed at 12/28/15 [id number]  Gross per 24 hour  Intake  2845.42 ml  Output      0 ml  Net 2845.42 ml   Filed Weights   12/26/15 1940 12/27/15 0055  Weight: 99.338 kg (219 lb) 99.6 kg (219 lb 9.3 oz)    Exam:   General:  Elderly male not in distress  HEENT: No pallor, moist mucosa, supple neck , no oral thrush  Chest: Right-sided Port-A-Cath, clear bilaterally  CVS: Normal S1 and S2, no murmurs  GI: Soft, nondistended, nontender, clean stage II sacral ulcer  Musculoskeletal: Warm, no edema  Data Reviewed: Basic Metabolic Panel:  Recent Labs Lab 12/26/15 1859 12/27/15 0558 12/28/15 0433  NA 142 137 138  K 4.1 3.6 3.6  CL 107 104 106  CO2 25 23 23   GLUCOSE 110* 104* 94  BUN 12 10 12   CREATININE 0.98 0.96 1.20  CALCIUM 9.4 8.7* 9.0   Liver Function Tests:  Recent Labs Lab 12/26/15 1859  AST 35  ALT 29  ALKPHOS 74  BILITOT 0.6  PROT 7.0  ALBUMIN 4.1   No results for input(s): LIPASE, AMYLASE in the last 168 hours. No results for input(s): AMMONIA in the last 168 hours. CBC:  Recent Labs Lab 12/26/15 1859 12/27/15 0558 12/27/15 1542  WBC 1.5* 2.1* 3.1*  NEUTROABS 0.5* 0.9* 1.7  HGB 12.5* 11.5* 11.0*  HCT 38.0* 34.3* 31.8*  MCV 84.1 82.3 80.9  PLT 144* 140* 134*   Cardiac Enzymes: No results for input(s): CKTOTAL, CKMB, CKMBINDEX, TROPONINI in the last 168 hours. BNP (last 3 results)  Recent Labs  08/21/15 1725 09/20/15 0008  BNP 86.7 33.9    ProBNP (last 3 results) No results for input(s): PROBNP in the last 8760  hours.  CBG: No results for input(s): GLUCAP in the last 168 hours.  Recent Results (from the past 240 hour(s))  Culture, blood (routine x 2)     Status: None (Preliminary result)   Collection Time: 12/26/15  6:55 PM  Result Value Ref Range Status   Specimen Description BLOOD LEFT ANTECUBITAL  Final   Special Requests BOTTLES DRAWN AEROBIC AND ANAEROBIC 5CC EACH  Final   Culture   Final    NO GROWTH 2 DAYS Performed at [facility]    Report Status PENDING   Incomplete  Urine culture     Status: None   Collection Time: 12/26/15  6:58 PM  Result Value Ref Range Status   Specimen Description URINE, CLEAN CATCH  Final   Special Requests NONE  Final   Culture   Final    NO GROWTH 1 DAY Performed at [facility]    Report Status 12/27/2015 FINAL  Final  Culture, blood (routine x 2)     Status: None (Preliminary result)   Collection Time: 12/26/15  7:44 PM  Result Value Ref Range Status   Specimen Description BLOOD RIGHT ARM  Final   Special Requests BOTTLES DRAWN AEROBIC AND ANAEROBIC 5CC  Final   Culture   Final    NO GROWTH 2 DAYS Performed at [facility]    Report Status PENDING  Incomplete     Studies: Dg Chest 2 View  12/26/2015  CLINICAL DATA:  Non-Hodgkin's lymphoma, fever EXAM: CHEST  2 VIEW COMPARISON:  09/19/2015 FINDINGS: Cardiomediastinal silhouette is stable. No pleural effusion. There is patchy right base medially atelectasis or infiltrate. No pulmonary edema. Right IJ Port-A-Cath with tip in distal SVC. Osteopenia and mild degenerative changes thoracic spine. IMPRESSION: Patchy right base medially atelectasis or infiltrate. No pulmonary edema. Electronically Signed   By: Lahoma Crocker M.D.   On: 12/26/2015 19:42    Scheduled Meds: . apixaban  5 mg Oral BID  . azithromycin  500 mg Intravenous Q24H  . ceFEPime (MAXIPIME) IV  2 g Intravenous Q8H  . pantoprazole  80 mg Oral Daily  . senna-docusate  2 tablet Oral QHS  . simvastatin  20 mg Oral Daily  . temazepam  15 mg Oral QHS,MR X 1   Continuous Infusions: . sodium chloride 125 mL/hr at 12/28/15 1030      Time spent: 25 minutes    Judy Pollman  [facility] Pager [phone] If 7PM-7AM, please contact night-coverage at [email or web address], password [email or web address] 12/28/2015, 3:48 PM  LOS: 2 days

## 2015-12-29 DIAGNOSIS — D61810 Antineoplastic chemotherapy induced pancytopenia: Secondary | ICD-10-CM

## 2015-12-29 DIAGNOSIS — J181 Lobar pneumonia, unspecified organism: Secondary | ICD-10-CM

## 2015-12-29 DIAGNOSIS — R509 Fever, unspecified: Secondary | ICD-10-CM

## 2015-12-29 DIAGNOSIS — T451X5A Adverse effect of antineoplastic and immunosuppressive drugs, initial encounter: Secondary | ICD-10-CM | POA: Insufficient documentation

## 2015-12-29 LAB — CBC WITH DIFFERENTIAL/PLATELET
BASOS ABS: 0 10*3/uL (ref 0.0–0.1)
BASOS PCT: 0 %
Eosinophils Absolute: 0 10*3/uL (ref 0.0–0.7)
Eosinophils Relative: 0 %
HEMATOCRIT: 32.3 % — AB (ref 39.0–52.0)
HEMOGLOBIN: 10.8 g/dL — AB (ref 13.0–17.0)
LYMPHS PCT: 23 %
Lymphs Abs: 0.6 10*3/uL — ABNORMAL LOW (ref 0.7–4.0)
MCH: 27.8 pg (ref 26.0–34.0)
MCHC: 33.4 g/dL (ref 30.0–36.0)
MCV: 83.2 fL (ref 78.0–100.0)
Monocytes Absolute: 0.8 10*3/uL (ref 0.1–1.0)
Monocytes Relative: 30 %
NEUTROS ABS: 1.2 10*3/uL — AB (ref 1.7–7.7)
NEUTROS PCT: 47 %
Platelets: 142 10*3/uL — ABNORMAL LOW (ref 150–400)
RBC: 3.88 MIL/uL — AB (ref 4.22–5.81)
RDW: 17.1 % — AB (ref 11.5–15.5)
WBC: 2.5 10*3/uL — AB (ref 4.0–10.5)

## 2015-12-29 LAB — RESPIRATORY VIRUS PANEL
ADENOVIRUS: NEGATIVE
INFLUENZA B 1: NEGATIVE
Influenza A: NEGATIVE
METAPNEUMOVIRUS: NEGATIVE
Parainfluenza 1: NEGATIVE
Parainfluenza 2: NEGATIVE
Parainfluenza 3: NEGATIVE
RESPIRATORY SYNCYTIAL VIRUS A: NEGATIVE
RHINOVIRUS: NEGATIVE
Respiratory Syncytial Virus B: NEGATIVE

## 2015-12-29 MED ORDER — GUAIFENESIN-DM 100-10 MG/5ML PO SYRP: 5.0000 mL | ORAL_SOLUTION | ORAL | Status: DC | PRN

## 2015-12-29 MED ORDER — LEVOFLOXACIN 750 MG PO TABS: 750.0000 mg | ORAL_TABLET | Freq: Every day | ORAL | Status: AC

## 2015-12-29 MED ORDER — HEPARIN SOD (PORK) LOCK FLUSH 100 UNIT/ML IV SOLN
500.0000 [IU] | INTRAVENOUS | Status: AC | PRN
  Administered 2015-12-29: 500 [IU]

## 2015-12-29 NOTE — Progress Notes (Signed)
PT Cancellation Note  Patient Details Name: Ethan Stewart MRN: [id number] DOB: November 20, 1946   Cancelled Treatment:    Reason Eval/Treat Not Completed: Order received. Chart reviewed. Spoke with RN-pt is set to d/c-no need to evaluate.    Weston Anna, MPT Pager: [phone]

## 2015-12-29 NOTE — Discharge Summary (Signed)
Physician Discharge Summary  Ethan Stewart [id number] DOB: 07/15/1946 DOA: 12/26/2015  PCP:  Melinda Crutch, MD  Admit date: 12/26/2015 Discharge date: 12/29/2015  Time spent: 35 minutes  Recommendations for Outpatient Follow-up:  1. Home with outpatient follow-up with oncologist. Patient will complete total 7 days of antibiotic on 01/02/2016   Discharge Diagnoses:  Principal Problem:   Febrile neutropenia ([place])   Active Problems:   DLBCL (diffuse large B cell lymphoma) (HCC)   GERD (gastroesophageal reflux disease)   Pressure ulcer   Pyrexia   Lobar pneumonia ([place])   Discharge Condition: Fair  Diet recommendation: Regular  Filed Weights   12/26/15 1940 12/27/15 0055  Weight: 99.338 kg (219 lb) 99.6 kg (219 lb 9.3 oz)    History of present illness:  70 year old male with diffuse large B-cell lymphoma on chemotherapy (last chemotherapy 14 days prior to admission), right lower leg DVT on Eliquis presented to the ED with fever of 101.11F at home. He called his oncologist and patient was hostile go to the ED with concern for febrile neutropenia. Patient has not been given Neulasta after chemotherapy due to history of Neulasta induced ARDS. In the ED workup showed neutropenia with ANC of 500. Chest x-ray was suggestive of right middle lobe pneumonia. Admitted to hospitalist service.  Hospital Course:  Febrile neutropenia. Possible right middle lobe pneumonia on chest x-ray. Placed On empiric cefepime and azithromycin. Cultures have been negative. Remains afebrile for the past 24 hours. -ANC improving and remains >thousand for the past 2 days.  -Patient feels better overall and is stable to be discharged home. I will send him on oral Levaquin to complete a total 7 day course of antibiotic. -Instructed to follow-up with his oncologist as scheduled.  Diffuse large B-cell lymphoma Receiving chemotherapy as outpatient. Oncology following.  Sacral decubitus ulcer stage II Appears  clean on exam and healing. Wound care consult appreciated.  Right lower leg DVT On eliquis  Hyperlipidemia Continue statin   Code Status: Full code Family Communication: None at bedside Disposition Plan: Home    Consultants:  Dr. Irene Limbo  Procedures:  None  Antibiotics:  IV cefepime and azithromycin  HPI/Subjective:  Discharge Exam: Filed Vitals:   12/28/15 2203 12/29/15 0612  BP: 137/71 129/93  Pulse: 90 86  Temp: 97.7 F (36.5 C) 97.8 F (36.6 C)  Resp: 20 20    General: Elderly male not in distress  HEENT: No pallor, moist mucosa, supple neck , no oral thrush  Chest: Right-sided Port-A-Cath, clear bilaterally  CVS: Normal S1 and S2, no murmurs  GI: Soft, nondistended, nontender, clean stage II sacral ulcer  Musculoskeletal: Warm, no edema  CNS: Alert and oriented  Discharge Instructions    Current Discharge Medication List    START taking these medications   Details  guaiFENesin-dextromethorphan (ROBITUSSIN DM) 100-10 MG/5ML syrup Take 5 mLs by mouth every 4 (four) hours as needed for cough. Qty: 118 mL, Refills: 0    levofloxacin (LEVAQUIN) 750 MG tablet Take 1 tablet (750 mg total) by mouth daily. Qty: 5 tablet, Refills: 0      CONTINUE these medications which have NOT CHANGED   Details  acetaminophen (TYLENOL) 325 MG tablet Take 2 tablets (650 mg total) by mouth every 6 (six) hours as needed for mild pain, moderate pain, fever or headache (or Fever >/= 101).    apixaban (ELIQUIS) 5 MG TABS tablet Take 1 tablet (5 mg total) by mouth 2 (two) times daily. Qty: 60 tablet, Refills: 0  mirtazapine (REMERON) 15 MG tablet Take 1 tablet (15 mg total) by mouth at bedtime. Might increase to 30mg  po HS in 7 days if still having insomnia Qty: 60 tablet, Refills: 1    Multiple Vitamin (MULTIVITAMIN WITH MINERALS) TABS tablet Take 1 tablet by mouth daily.    omeprazole (PRILOSEC) 40 MG capsule Take 1 capsule (40 mg total) by mouth daily. Qty: 30  capsule, Refills: 2    prochlorperazine (COMPAZINE) 10 MG tablet Take 1 tablet (10 mg total) by mouth every 6 (six) hours as needed (Nausea or vomiting). Qty: 30 tablet, Refills: 6   Associated Diagnoses: DLBCL (diffuse large B cell lymphoma) (HCC)    senna-docusate (SENNA S) 8.6-50 MG per tablet Take 2 tablets by mouth at bedtime. Qty: 60 tablet, Refills: 1    simvastatin (ZOCOR) 20 MG tablet Take 20 mg by mouth daily.    temazepam (RESTORIL) 15 MG capsule Take 15 mg by mouth at bedtime and may repeat dose one time if needed.    neomycin-bacitracin-polymyxin (NEOSPORIN) ointment Apply 1 application topically as needed for wound care (left foot blister).     polyethylene glycol (MIRALAX / GLYCOLAX) packet Take 17 g by mouth daily as needed for mild constipation.        Allergies  Allergen Reactions  . Neulasta [Pegfilgrastim] Other (See Comments)    ARDS likely related to Neulasta   Follow-up Information    Follow up with Sullivan Lone, MD In 2 weeks.   Specialties:  Hematology, Oncology   Contact information:   [place] [address] [phone]        The results of significant diagnostics from this hospitalization (including imaging, microbiology, ancillary and laboratory) are listed below for reference.    Significant Diagnostic Studies: Dg Chest 2 View  12/26/2015  CLINICAL DATA:  Non-Hodgkin's lymphoma, fever EXAM: CHEST  2 VIEW COMPARISON:  09/19/2015 FINDINGS: Cardiomediastinal silhouette is stable. No pleural effusion. There is patchy right base medially atelectasis or infiltrate. No pulmonary edema. Right IJ Port-A-Cath with tip in distal SVC. Osteopenia and mild degenerative changes thoracic spine. IMPRESSION: Patchy right base medially atelectasis or infiltrate. No pulmonary edema. Electronically Signed   By: Lahoma Crocker M.D.   On: 12/26/2015 19:42    Microbiology: Recent Results (from the past 240 hour(s))  Culture, blood (routine x 2)     Status:  None (Preliminary result)   Collection Time: 12/26/15  6:55 PM  Result Value Ref Range Status   Specimen Description BLOOD LEFT ANTECUBITAL  Final   Special Requests BOTTLES DRAWN AEROBIC AND ANAEROBIC 5CC EACH  Final   Culture   Final    NO GROWTH 2 DAYS Performed at [facility]    Report Status PENDING  Incomplete  Urine culture     Status: None   Collection Time: 12/26/15  6:58 PM  Result Value Ref Range Status   Specimen Description URINE, CLEAN CATCH  Final   Special Requests NONE  Final   Culture   Final    NO GROWTH 1 DAY Performed at [facility]    Report Status 12/27/2015 FINAL  Final  Culture, blood (routine x 2)     Status: None (Preliminary result)   Collection Time: 12/26/15  7:44 PM  Result Value Ref Range Status   Specimen Description BLOOD RIGHT ARM  Final   Special Requests BOTTLES DRAWN AEROBIC AND ANAEROBIC 5CC  Final   Culture   Final    NO GROWTH  2 DAYS Performed at [facility]    Report Status PENDING  Incomplete  Respiratory virus panel     Status: None   Collection Time: 12/27/15 12:17 PM  Result Value Ref Range Status   Respiratory Syncytial Virus A Negative Negative Final   Respiratory Syncytial Virus B Negative Negative Final   Influenza A Negative Negative Final   Influenza B Negative Negative Final   Parainfluenza 1 Negative Negative Final   Parainfluenza 2 Negative Negative Final   Parainfluenza 3 Negative Negative Final   Metapneumovirus Negative Negative Final   Rhinovirus Negative Negative Final   Adenovirus Negative Negative Final    Comment: (NOTE) Performed At: [facility] [place], [place] [id number] Lindon Romp MD [id number]      Labs: Basic Metabolic Panel:  Recent Labs Lab 12/26/15 1859 12/27/15 0558 12/28/15 0433  NA 142 137 138  K 4.1 3.6 3.6  CL 107 104 106  CO2 25 23 23   GLUCOSE 110* 104* 94  BUN 12 10 12   CREATININE 0.98 0.96 1.20  CALCIUM 9.4 8.7* 9.0    Liver Function Tests:  Recent Labs Lab 12/26/15 1859  AST 35  ALT 29  ALKPHOS 74  BILITOT 0.6  PROT 7.0  ALBUMIN 4.1   No results for input(s): LIPASE, AMYLASE in the last 168 hours. No results for input(s): AMMONIA in the last 168 hours. CBC:  Recent Labs Lab 12/26/15 1859 12/27/15 0558 12/27/15 1542 12/29/15 0348  WBC 1.5* 2.1* 3.1* 2.5*  NEUTROABS 0.5* 0.9* 1.7 1.2*  HGB 12.5* 11.5* 11.0* 10.8*  HCT 38.0* 34.3* 31.8* 32.3*  MCV 84.1 82.3 80.9 83.2  PLT 144* 140* 134* 142*   Cardiac Enzymes: No results for input(s): CKTOTAL, CKMB, CKMBINDEX, TROPONINI in the last 168 hours. BNP: BNP (last 3 results)  Recent Labs  08/21/15 1725 09/20/15 0008  BNP 86.7 33.9    ProBNP (last 3 results) No results for input(s): PROBNP in the last 8760 hours.  CBG: No results for input(s): GLUCAP in the last 168 hours.     Signed:  Louellen Molder MD    [facility] 12/29/2015, 9:43 AM

## 2015-12-29 NOTE — Discharge Instructions (Signed)
Neutropenia Neutropenia is a condition that occurs when the level of a certain type of white blood cell (neutrophil) in your body becomes lower than normal. Neutrophils are made in the bone marrow and fight infections. These cells protect against bacteria and viruses. The fewer neutrophils you have, and the longer your body remains without them, the greater your risk of getting a severe infection becomes. CAUSES  The cause of neutropenia may be hard to determine. However, it is usually due to 3 main problems:   Decreased production of neutrophils. This may be due to:  Certain medicines such as chemotherapy.  Genetic problems.  Cancer.  Radiation treatments.  Vitamin deficiency.  Some pesticides.  Increased destruction of neutrophils. This may be due to:  Overwhelming infections.  Hemolytic anemia. This is when the body destroys its own blood cells.  Chemotherapy.  Neutrophils moving to areas of the body where they cannot fight infections. This may be due to:  Dialysis procedures.  Conditions where the spleen becomes enlarged. Neutrophils are held in the spleen and are not available to the rest of the body.  Overwhelming infections. The neutrophils are held in the area of the infection and are not available to the rest of the body. SYMPTOMS  There are no specific symptoms of neutropenia. The lack of neutrophils can result in an infection, and an infection can cause various problems. DIAGNOSIS  Diagnosis is made by a blood test. A complete blood count is performed. The normal level of neutrophils in human blood differs with age and race. Infants have lower counts than older children and adults. African Americans have lower counts than Caucasians or Asians. The average adult level is 1500 cells/mm3 of blood. Neutrophil counts are interpreted as follows:  Greater than 1000 cells/mm3 gives normal protection against infection.  500 to 1000 cells/mm3 gives an increased risk for  infection.  200 to 500 cells/mm3 is a greater risk for severe infection.  Lower than 200 cells/mm3 is a marked risk of infection. This may require hospitalization and treatment with antibiotic medicines. TREATMENT  Treatment depends on the underlying cause, severity, and presence of infections or symptoms. It also depends on your health. Your caregiver will discuss the treatment plan with you. Mild cases are often easily treated and have a good outcome. Preventative measures may also be started to limit your risk of infections. Treatment can include:  Taking antibiotics.  Stopping medicines that are known to cause neutropenia.  Correcting nutritional deficiencies by eating green vegetables to supply folic acid and taking vitamin B supplements.  Stopping exposure to pesticides if your neutropenia is related to pesticide exposure.  Taking a blood growth factor called sargramostim, pegfilgrastim, or filgrastim if you are undergoing chemotherapy for cancer. This stimulates white blood cell production.  Removal of the spleen if you have Felty's syndrome and have repeated infections. HOME CARE INSTRUCTIONS   Follow your caregiver's instructions about when you need to have blood work done.  Wash your hands often. Make sure others who come in contact with you also wash their hands.  Wash raw fruits and vegetables before eating them. They can carry bacteria and fungi.  Avoid people with colds or spreadable (contagious) diseases (chickenpox, herpes zoster, influenza).  Avoid large crowds.  Avoid construction areas. The dust can release fungus into the air.  Be cautious around children in daycare or school environments.  Take care of your respiratory system by coughing and deep breathing.  Bathe daily.  Protect your skin from cuts and   burns.  Do not work in the garden or with flowers and plants.  Care for the mouth before and after meals by brushing with a soft toothbrush. If you have  mucositis, do not use mouthwash. Mouthwash contains alcohol and can dry out the mouth even more.  Clean the area between the genitals and the anus (perineal area) after urination and bowel movements. Women need to wipe from front to back.  Use a water soluble lubricant during sexual intercourse and practice good hygiene after. Do not have intercourse if you are severely neutropenic. Check with your caregiver for guidelines.  Exercise daily as tolerated.  Avoid people who were vaccinated with a live vaccine in the past 30 days. You should not receive live vaccines (polio, typhoid).  Do not provide direct care for pets. Avoid animal droppings. Do not clean litter boxes and bird cages.  Do not share food utensils.  Do not use tampons, enemas, or rectal suppositories unless directed by your caregiver.  Use an electric razor to remove hair.  Wash your hands after handling magazines, letters, and newspapers. SEEK IMMEDIATE MEDICAL CARE IF:   You have a fever.  You have chills or start to shake.  You feel nauseous or vomit.  You develop mouth sores.  You develop aches and pains.  You have redness and swelling around open wounds.  Your skin is warm to the touch.  You have pus coming from your wounds.  You develop swollen lymph nodes.  You feel weak or fatigued.  You develop red streaks on the skin. MAKE SURE YOU:  Understand these instructions.  Will watch your condition.  Will get help right away if you are not doing well or get worse.   This information is not intended to replace advice given to you by your health care provider. Make sure you discuss any questions you have with your health care provider.   Document Released: 05/31/2002 Document Revised: 03/02/2012 Document Reviewed: 06/21/2015 Elsevier Interactive Patient Education 2016 [employer]  

## 2015-12-31 LAB — CULTURE, BLOOD (ROUTINE X 2)
CULTURE: NO GROWTH
CULTURE: NO GROWTH

## 2016-01-01 ENCOUNTER — Other Ambulatory Visit: Payer: Self-pay | Admitting: *Deleted

## 2016-01-01 ENCOUNTER — Telehealth: Payer: Self-pay | Admitting: Hematology

## 2016-01-01 ENCOUNTER — Ambulatory Visit
Admission: RE | Admit: 2016-01-01 | Discharge: 2016-01-01 | Disposition: A | Discharge status: Home / Self Care | Payer: PPO | Source: Ambulatory Visit | Attending: Radiation Oncology | Admitting: Radiation Oncology

## 2016-01-01 DIAGNOSIS — M899 Disorder of bone, unspecified: Secondary | ICD-10-CM | POA: Diagnosis present

## 2016-01-01 DIAGNOSIS — Z510 Encounter for antineoplastic radiation therapy: Secondary | ICD-10-CM | POA: Diagnosis not present

## 2016-01-01 DIAGNOSIS — F419 Anxiety disorder, unspecified: Secondary | ICD-10-CM | POA: Diagnosis not present

## 2016-01-01 DIAGNOSIS — C412 Malignant neoplasm of vertebral column: Secondary | ICD-10-CM | POA: Diagnosis present

## 2016-01-01 DIAGNOSIS — C8330 Diffuse large B-cell lymphoma, unspecified site: Secondary | ICD-10-CM

## 2016-01-01 NOTE — Telephone Encounter (Signed)
Spoke with patient ad he is aware of is appointments

## 2016-01-03 ENCOUNTER — Other Ambulatory Visit: Payer: Self-pay | Admitting: *Deleted

## 2016-01-03 DIAGNOSIS — C8330 Diffuse large B-cell lymphoma, unspecified site: Secondary | ICD-10-CM

## 2016-01-03 MED ORDER — APIXABAN 5 MG PO TABS: 5.0000 mg | ORAL_TABLET | Freq: Two times a day (BID) | ORAL | Status: DC

## 2016-01-04 DIAGNOSIS — Z510 Encounter for antineoplastic radiation therapy: Secondary | ICD-10-CM | POA: Diagnosis not present

## 2016-01-05 ENCOUNTER — Encounter (HOSPITAL_BASED_OUTPATIENT_CLINIC_OR_DEPARTMENT_OTHER): Payer: PPO | Attending: Internal Medicine

## 2016-01-05 DIAGNOSIS — L89153 Pressure ulcer of sacral region, stage 3: Secondary | ICD-10-CM | POA: Insufficient documentation

## 2016-01-05 DIAGNOSIS — Z86718 Personal history of other venous thrombosis and embolism: Secondary | ICD-10-CM | POA: Diagnosis not present

## 2016-01-05 DIAGNOSIS — Z9221 Personal history of antineoplastic chemotherapy: Secondary | ICD-10-CM | POA: Diagnosis not present

## 2016-01-05 DIAGNOSIS — M109 Gout, unspecified: Secondary | ICD-10-CM | POA: Insufficient documentation

## 2016-01-05 DIAGNOSIS — Z510 Encounter for antineoplastic radiation therapy: Secondary | ICD-10-CM | POA: Diagnosis not present

## 2016-01-08 ENCOUNTER — Other Ambulatory Visit: Payer: PPO | Admitting: *Deleted

## 2016-01-08 ENCOUNTER — Ambulatory Visit
Admission: RE | Admit: 2016-01-08 | Discharge: 2016-01-08 | Disposition: A | Discharge status: Home / Self Care | Payer: PPO | Source: Ambulatory Visit | Attending: Radiation Oncology | Admitting: Radiation Oncology

## 2016-01-08 DIAGNOSIS — L89153 Pressure ulcer of sacral region, stage 3: Secondary | ICD-10-CM | POA: Diagnosis not present

## 2016-01-08 DIAGNOSIS — D492 Neoplasm of unspecified behavior of bone, soft tissue, and skin: Secondary | ICD-10-CM

## 2016-01-08 DIAGNOSIS — Z510 Encounter for antineoplastic radiation therapy: Secondary | ICD-10-CM | POA: Diagnosis not present

## 2016-01-08 MED ORDER — LORAZEPAM 0.5 MG PO TABS: ORAL_TABLET | ORAL | Status: DC

## 2016-01-08 MED ORDER — LORAZEPAM 0.5 MG PO TABS
ORAL_TABLET | ORAL | Status: AC
  Filled 2016-01-08: qty 1

## 2016-01-08 MED ORDER — LORAZEPAM 0.5 MG PO TABS: 0.5000 mg | ORAL_TABLET | Freq: Three times a day (TID) | ORAL | Status: DC

## 2016-01-08 MED ORDER — LORAZEPAM 0.5 MG PO TABS
0.5000 mg | ORAL_TABLET | Freq: Once | ORAL | Status: AC
  Administered 2016-01-08: 0.5 mg via ORAL
  Filled 2016-01-08: qty 1

## 2016-01-08 MED ORDER — LORAZEPAM 0.5 MG PO TABS: 0.5000 mg | ORAL_TABLET | Freq: Once | ORAL | Status: DC

## 2016-01-09 ENCOUNTER — Ambulatory Visit
Admission: RE | Admit: 2016-01-09 | Discharge: 2016-01-09 | Disposition: A | Discharge status: Home / Self Care | Payer: PPO | Source: Ambulatory Visit | Attending: Radiation Oncology | Admitting: Radiation Oncology

## 2016-01-09 DIAGNOSIS — Z510 Encounter for antineoplastic radiation therapy: Secondary | ICD-10-CM | POA: Diagnosis not present

## 2016-01-10 ENCOUNTER — Ambulatory Visit
Admission: RE | Admit: 2016-01-10 | Discharge: 2016-01-10 | Disposition: A | Discharge status: Home / Self Care | Payer: PPO | Source: Ambulatory Visit | Attending: Radiation Oncology | Admitting: Radiation Oncology

## 2016-01-10 DIAGNOSIS — C61 Malignant neoplasm of prostate: Secondary | ICD-10-CM

## 2016-01-10 DIAGNOSIS — Z510 Encounter for antineoplastic radiation therapy: Secondary | ICD-10-CM | POA: Diagnosis not present

## 2016-01-10 MED ORDER — RADIAPLEXRX EX GEL
Freq: Once | CUTANEOUS | Status: AC
  Administered 2016-01-10: 16:00:00 via TOPICAL

## 2016-01-10 NOTE — Addendum Note (Signed)
Encounter addended by: Doreen Beam, RN on: 01/10/2016  3:36 PM<BR>     Documentation filed: Dx Association, Demographics Visit, Inpatient MAR, Orders

## 2016-01-11 ENCOUNTER — Ambulatory Visit
Admission: RE | Admit: 2016-01-11 | Discharge: 2016-01-11 | Disposition: A | Discharge status: Home / Self Care | Payer: PPO | Source: Ambulatory Visit | Attending: Radiation Oncology | Admitting: Radiation Oncology

## 2016-01-11 DIAGNOSIS — Z510 Encounter for antineoplastic radiation therapy: Secondary | ICD-10-CM | POA: Diagnosis not present

## 2016-01-12 ENCOUNTER — Ambulatory Visit
Admission: RE | Admit: 2016-01-12 | Discharge: 2016-01-12 | Disposition: A | Discharge status: Home / Self Care | Payer: PPO | Source: Ambulatory Visit | Attending: Radiation Oncology | Admitting: Radiation Oncology

## 2016-01-12 ENCOUNTER — Encounter: Payer: Self-pay | Admitting: Radiation Oncology

## 2016-01-12 VITALS — BP 150/85 | HR 99 | Temp 98.4°F | Resp 20 | Wt 225.0 lb

## 2016-01-12 DIAGNOSIS — Z510 Encounter for antineoplastic radiation therapy: Secondary | ICD-10-CM | POA: Diagnosis not present

## 2016-01-12 DIAGNOSIS — C8330 Diffuse large B-cell lymphoma, unspecified site: Secondary | ICD-10-CM

## 2016-01-12 NOTE — Progress Notes (Signed)
  Radiation Oncology         [phone] ________________________________  Name: Ethan Stewart MRN: [id number]  Date: 01/01/2016  DOB: 18-Jun-1946  SIMULATION AND TREATMENT PLANNING NOTE  DIAGNOSIS:     ICD-9-CM ICD-10-CM   1. DLBCL (diffuse large B cell lymphoma) (HCC) 202.80 C83.30      Site:   1.  C4-6 spine 2.  Abdominal node  NARRATIVE:  The patient was brought to the [facility].  Identity was confirmed.  All relevant records and images related to the planned course of therapy were reviewed.   Written consent to proceed with treatment was confirmed which was freely given after reviewing the details related to the planned course of therapy had been reviewed with the patient.  Then, the patient was set-up in a stable reproducible  supine position for radiation therapy.  CT images were obtained.  Surface markings were placed.    Medically necessary complex treatment device(s) for immobilization:   1.  Customized vac lock bag 2.  Customized thermoplastic head cast  The CT images were loaded into the planning software.  Then the target and avoidance structures were contoured.  Treatment planning then occurred.  The radiation prescription was entered and confirmed.  A total of 5plex treatment devices were fabricated which relate to the designed radiation treatment fields:  2 fields for treatment of the cervical spine and 3 fields to treat the abdomen. Each of these customized fields/ complex treatment devices will be used on a daily basis during the radiation course. I have requested : 3D Simulation  I have requested a DVH of the following structures: Target volume,  left kidney, right kidney.  The patient will undergo daily image guidance to ensure accurate localization of the target, and adequate minimize dose to the normal surrounding structures in close proximity to the target.   PLAN:  The patient will receive 30  Gy in 10  fractions.  ________________________________   Jodelle Gross, MD, PhD

## 2016-01-12 NOTE — Progress Notes (Signed)
weekly rad txs 4/10 abd/c-spine  Completed, no nausea, pain,   Stated, appetite good 2:19 PM BP 150/85 mmHg  Pulse 99  Temp(Src) 98.4 F (36.9 C) (Oral)  Resp 20  Wt 225 lb (102.059 kg)  Wt Readings from Last 3 Encounters:  01/12/16 225 lb (102.059 kg)  12/27/15 219 lb 9.3 oz (99.6 kg)  12/04/15 219 lb 9.6 oz (99.61 kg)

## 2016-01-12 NOTE — Progress Notes (Signed)
Department of Radiation Oncology  Phone:  [phone] Fax:        [phone]  Weekly Treatment Note    Name: Ethan Stewart Date: 01/12/2016 MRN: [id number] DOB: 07-13-1946   Diagnosis:     ICD-9-CM ICD-10-CM   1. DLBCL (diffuse large B cell lymphoma) (HCC) 202.80 C83.30      Current dose: 12 Gy  Current fraction:4   MEDICATIONS: Current Outpatient Prescriptions  Medication Sig Dispense Refill  . acetaminophen (TYLENOL) 325 MG tablet Take 2 tablets (650 mg total) by mouth every 6 (six) hours as needed for mild pain, moderate pain, fever or headache (or Fever >/= 101).    [name] Kitchen apixaban (ELIQUIS) 5 MG TABS tablet Take 1 tablet (5 mg total) by mouth 2 (two) times daily. 60 tablet 1  . hyaluronate sodium (RADIAPLEXRX) GEL Apply 1 application topically daily.    [name] Kitchen LORazepam (ATIVAN) 0.5 MG tablet Take 1 tablet (0.5 mg total) by mouth every 8 (eight) hours. 30 tablet 0  . mirtazapine (REMERON) 15 MG tablet Take 1 tablet (15 mg total) by mouth at bedtime. Might increase to 30mg  po HS in 7 days if still having insomnia (Patient taking differently: Take 15 mg by mouth daily as needed (sleep). ) 60 tablet 1  . Multiple Vitamin (MULTIVITAMIN WITH MINERALS) TABS tablet Take 1 tablet by mouth daily.    [name] Kitchen omeprazole (PRILOSEC) 40 MG capsule Take 1 capsule (40 mg total) by mouth daily. 30 capsule 2  . polyethylene glycol (MIRALAX / GLYCOLAX) packet Take 17 g by mouth daily as needed for mild constipation.     . prochlorperazine (COMPAZINE) 10 MG tablet Take 1 tablet (10 mg total) by mouth every 6 (six) hours as needed (Nausea or vomiting). 30 tablet 6  . simvastatin (ZOCOR) 20 MG tablet Take 20 mg by mouth daily.    . temazepam (RESTORIL) 15 MG capsule Take 15 mg by mouth at bedtime and may repeat dose one time if needed.    [name] Kitchen guaiFENesin-dextromethorphan (ROBITUSSIN DM) 100-10 MG/5ML syrup Take 5 mLs by mouth every 4 (four) hours as needed for cough. (Patient not taking: Reported on  01/12/2016) 118 mL 0  . neomycin-bacitracin-polymyxin (NEOSPORIN) ointment Apply 1 application topically as needed for wound care (left foot blister). Reported on 01/12/2016    . senna-docusate (SENNA S) 8.6-50 MG per tablet Take 2 tablets by mouth at bedtime. (Patient not taking: Reported on 01/12/2016) 60 tablet 1   No current facility-administered medications for this encounter.     ALLERGIES: Neulasta   LABORATORY DATA:  Lab Results  Component Value Date   WBC 2.5* 12/29/2015   HGB 10.8* 12/29/2015   HCT 32.3* 12/29/2015   MCV 83.2 12/29/2015   PLT 142* 12/29/2015   Lab Results  Component Value Date   NA 138 12/28/2015   K 3.6 12/28/2015   CL 106 12/28/2015   CO2 23 12/28/2015   Lab Results  Component Value Date   ALT 29 12/26/2015   AST 35 12/26/2015   ALKPHOS 74 12/26/2015   BILITOT 0.6 12/26/2015     NARRATIVE: Ethan Stewart was seen today for weekly treatment management. The chart was checked and the patient's films were reviewed.  Denies nausea, diarrhea, or pain. Good appetite.  PHYSICAL EXAMINATION: weight is 225 lb (102.059 kg). His oral temperature is 98.4 F (36.9 C). His blood pressure is 150/85 and his pulse is 99. His respiration is 20.   ASSESSMENT: The patient is doing satisfactorily with  treatment.  PLAN: We will continue with the patient's radiation treatment as planned. He is scheduled to see Dr. Irene Stewart on 01/15/16.  This document serves as a record of services personally performed by Kyung Rudd, MD. It was created on his behalf by Darcus Austin, a trained medical scribe. The creation of this record is based on the scribe's personal observations and the provider's statements to them. This document has been checked and approved by the attending provider.

## 2016-01-15 ENCOUNTER — Encounter: Payer: Self-pay | Admitting: Hematology

## 2016-01-15 ENCOUNTER — Ambulatory Visit (HOSPITAL_BASED_OUTPATIENT_CLINIC_OR_DEPARTMENT_OTHER): Payer: PPO | Admitting: Hematology

## 2016-01-15 ENCOUNTER — Other Ambulatory Visit (HOSPITAL_BASED_OUTPATIENT_CLINIC_OR_DEPARTMENT_OTHER): Payer: PPO

## 2016-01-15 ENCOUNTER — Telehealth: Payer: Self-pay | Admitting: Hematology

## 2016-01-15 ENCOUNTER — Ambulatory Visit
Admission: RE | Admit: 2016-01-15 | Discharge: 2016-01-15 | Disposition: A | Discharge status: Home / Self Care | Payer: PPO | Source: Ambulatory Visit | Attending: Radiation Oncology | Admitting: Radiation Oncology

## 2016-01-15 VITALS — BP 137/73 | HR 90 | Temp 98.2°F | Resp 16 | Wt 225.2 lb

## 2016-01-15 DIAGNOSIS — K2970 Gastritis, unspecified, without bleeding: Secondary | ICD-10-CM | POA: Diagnosis not present

## 2016-01-15 DIAGNOSIS — C8330 Diffuse large B-cell lymphoma, unspecified site: Secondary | ICD-10-CM

## 2016-01-15 DIAGNOSIS — L89159 Pressure ulcer of sacral region, unspecified stage: Secondary | ICD-10-CM | POA: Diagnosis not present

## 2016-01-15 DIAGNOSIS — K219 Gastro-esophageal reflux disease without esophagitis: Secondary | ICD-10-CM

## 2016-01-15 DIAGNOSIS — F329 Major depressive disorder, single episode, unspecified: Secondary | ICD-10-CM

## 2016-01-15 DIAGNOSIS — G4700 Insomnia, unspecified: Secondary | ICD-10-CM

## 2016-01-15 DIAGNOSIS — Z510 Encounter for antineoplastic radiation therapy: Secondary | ICD-10-CM | POA: Diagnosis not present

## 2016-01-15 DIAGNOSIS — M6250 Muscle wasting and atrophy, not elsewhere classified, unspecified site: Secondary | ICD-10-CM

## 2016-01-15 DIAGNOSIS — C8338 Diffuse large B-cell lymphoma, lymph nodes of multiple sites: Secondary | ICD-10-CM

## 2016-01-15 DIAGNOSIS — D492 Neoplasm of unspecified behavior of bone, soft tissue, and skin: Secondary | ICD-10-CM

## 2016-01-15 DIAGNOSIS — I824Z1 Acute embolism and thrombosis of unspecified deep veins of right distal lower extremity: Secondary | ICD-10-CM

## 2016-01-15 LAB — CBC & DIFF AND RETIC
BASO%: 0.8 % (ref 0.0–2.0)
BASOS ABS: 0 10*3/uL (ref 0.0–0.1)
EOS ABS: 0.1 10*3/uL (ref 0.0–0.5)
EOS%: 1.9 % (ref 0.0–7.0)
HCT: 35.1 % — ABNORMAL LOW (ref 38.4–49.9)
HEMOGLOBIN: 12.1 g/dL — AB (ref 13.0–17.1)
Immature Retic Fract: 2.8 % — ABNORMAL LOW (ref 3.00–10.60)
LYMPH%: 30 % (ref 14.0–49.0)
MCH: 28.7 pg (ref 27.2–33.4)
MCHC: 34.5 g/dL (ref 32.0–36.0)
MCV: 83.4 fL (ref 79.3–98.0)
MONO#: 0.7 10*3/uL (ref 0.1–0.9)
MONO%: 27.4 % — AB (ref 0.0–14.0)
NEUT#: 1.1 10*3/uL — ABNORMAL LOW (ref 1.5–6.5)
NEUT%: 39.9 % (ref 39.0–75.0)
PLATELETS: 131 10*3/uL — AB (ref 140–400)
RBC: 4.21 10*6/uL (ref 4.20–5.82)
RDW: 17.5 % — ABNORMAL HIGH (ref 11.0–14.6)
Retic %: 1.26 % (ref 0.80–1.80)
Retic Ct Abs: 53.05 10*3/uL (ref 34.80–93.90)
WBC: 2.6 10*3/uL — AB (ref 4.0–10.3)
lymph#: 0.8 10*3/uL — ABNORMAL LOW (ref 0.9–3.3)

## 2016-01-15 LAB — COMPREHENSIVE METABOLIC PANEL
ALBUMIN: 3.6 g/dL (ref 3.5–5.0)
ALT: 32 U/L (ref 0–55)
AST: 39 U/L — ABNORMAL HIGH (ref 5–34)
Alkaline Phosphatase: 90 U/L (ref 40–150)
Anion Gap: 8 mEq/L (ref 3–11)
BILIRUBIN TOTAL: 0.39 mg/dL (ref 0.20–1.20)
BUN: 11.8 mg/dL (ref 7.0–26.0)
CO2: 25 mEq/L (ref 22–29)
CREATININE: 0.9 mg/dL (ref 0.7–1.3)
Calcium: 9.4 mg/dL (ref 8.4–10.4)
Chloride: 110 mEq/L — ABNORMAL HIGH (ref 98–109)
EGFR: 83 mL/min/{1.73_m2} — AB (ref >90)
Glucose: 95 mg/dl (ref 70–140)
Potassium: 3.9 mEq/L (ref 3.5–5.1)
Sodium: 143 mEq/L (ref 136–145)
TOTAL PROTEIN: 6.4 g/dL (ref 6.4–8.3)

## 2016-01-15 LAB — LACTATE DEHYDROGENASE: LDH: 242 U/L (ref 125–245)

## 2016-01-15 NOTE — Telephone Encounter (Signed)
Pt confirmed appt; received avs, reviewed and received info on PET in future

## 2016-01-16 ENCOUNTER — Ambulatory Visit
Admission: RE | Admit: 2016-01-16 | Discharge: 2016-01-16 | Disposition: A | Discharge status: Home / Self Care | Payer: PPO | Source: Ambulatory Visit | Attending: Radiation Oncology | Admitting: Radiation Oncology

## 2016-01-16 DIAGNOSIS — Z510 Encounter for antineoplastic radiation therapy: Secondary | ICD-10-CM | POA: Diagnosis not present

## 2016-01-16 NOTE — Progress Notes (Signed)
Ethan Kitchen  HEMATOLOGY ONCOLOGY PROGRESS NOTE Date of service  .01/15/2016   Patient Care Team: Lona Kettle, MD as PCP - General (Family Medicine) Brunetta Genera, MD as Consulting Physician (Hematology)  CC: follow for diffuse large B-cell lymphoma  Diagnosis: Diffuse large B-cell lymphoma Stage IVAE with extranodal involvement of C5 vertebra status post C5 corpectomy   Treatment:  -Status post 6 cycles of R CHOP (dose reductions for cycle 2-3)  -Cannot use G-CSF due to issues with severe G-CSF related ARDS after cycle 1  INTERVAL HISTORY:  [name] is here for his scheduled follow-up  After his 6th cycle of R CHOP chemotherapy. He was admitted for neutropenic fevers after his last cycle of chemotherapy.  Cultures remained negative..  Except for possible early infiltrate and he completed a course of antibiotics.  No further fevers or chills.  He is currently getting RT to his C5 lesion and residual abdominal disease with Dr. Lisbeth Renshaw.has been tolerating RT well with no acute concerns at this time.  He has recovered significant amount of strength and range of movement in his right shoulder.  Notes some right shoulder discomfort with extremes of movement which he notes that after stretching activities in physical therapy. No new lymphadenopathy.  No night sweats. LDH level remains within normal limits.  REVIEW OF SYSTEMS:   10 point review of systems was done and is negative except as noted above  I have reviewed the past medical history, past surgical history, social history and family history with the patient and they are unchanged from previous note.  ALLERGIES:  is allergic to neulasta.  MEDICATIONS:  Current Outpatient Prescriptions  Medication Sig Dispense Refill  . acetaminophen (TYLENOL) 325 MG tablet Take 2 tablets (650 mg total) by mouth every 6 (six) hours as needed for mild pain, moderate pain, fever or headache (or Fever >/= 101).    Ethan Kitchen apixaban (ELIQUIS) 5 MG TABS tablet Take  1 tablet (5 mg total) by mouth 2 (two) times daily. 60 tablet 1  . guaiFENesin-dextromethorphan (ROBITUSSIN DM) 100-10 MG/5ML syrup Take 5 mLs by mouth every 4 (four) hours as needed for cough. (Patient not taking: Reported on 01/12/2016) 118 mL 0  . hyaluronate sodium (RADIAPLEXRX) GEL Apply 1 application topically daily.    Ethan Kitchen LORazepam (ATIVAN) 0.5 MG tablet Take 1 tablet (0.5 mg total) by mouth every 8 (eight) hours. 30 tablet 0  . mirtazapine (REMERON) 15 MG tablet Take 1 tablet (15 mg total) by mouth at bedtime. Might increase to 73m po HS in 7 days if still having insomnia (Patient taking differently: Take 15 mg by mouth daily as needed (sleep). ) 60 tablet 1  . Multiple Vitamin (MULTIVITAMIN WITH MINERALS) TABS tablet Take 1 tablet by mouth daily.    .Ethan Kitchenneomycin-bacitracin-polymyxin (NEOSPORIN) ointment Apply 1 application topically as needed for wound care (left foot blister). Reported on 01/12/2016    . omeprazole (PRILOSEC) 40 MG capsule Take 1 capsule (40 mg total) by mouth daily. 30 capsule 2  . polyethylene glycol (MIRALAX / GLYCOLAX) packet Take 17 g by mouth daily as needed for mild constipation.     . prochlorperazine (COMPAZINE) 10 MG tablet Take 1 tablet (10 mg total) by mouth every 6 (six) hours as needed (Nausea or vomiting). 30 tablet 6  . senna-docusate (SENNA S) 8.6-50 MG per tablet Take 2 tablets by mouth at bedtime. (Patient not taking: Reported on 01/12/2016) 60 tablet 1  . simvastatin (ZOCOR) 20 MG tablet Take 20 mg by  mouth daily.    . temazepam (RESTORIL) 15 MG capsule Take 15 mg by mouth at bedtime and may repeat dose one time if needed.     No current facility-administered medications for this visit.    PHYSICAL EXAMINATION: ECOG PERFORMANCE STATUS: 1 - Symptomatic but completely ambulatory  Filed Vitals:   01/15/16 1447  BP: 137/73  Pulse: 90  Temp: 98.2 F (36.8 C)  Resp: 16   Filed Weights   01/15/16 1447  Weight: 225 lb 3.2 oz (102.15 kg)     GENERAL:alert, no distress and comfortable SKIN: no acute rashes. Sacral decubitus ulcer not examined today -patient following wound care and notes that it is nearly completely healed. EYES: normal, Conjunctiva are pink and non-injected, sclera clear OROPHARYNX:no exudate, no erythema and lips, buccal mucosa, and tongue normal  NECK: supple, thyroid normal size, non-tender, without nodularity LYMPH:no palpable cervical, axillary or inguinal lymphadenopathy . LUNGS: clear to auscultation and percussion with normal breathing effort HEART: regular rate & rhythm and no murmurs and no lower extremity edema ABDOMEN:abdomen soft, non-tender and normal bowel sounds Musculoskeletal:no cyanosis of digits and no clubbing  NEURO: alert & oriented x 3 with fluent speech, 4+/5 weakness right upper extremity abduction-significantly improved strength nearly back to baseline  LABORATORY DATA:   CBC Latest Ref Rng 01/15/2016 12/29/2015 12/27/2015  WBC 4.0 - 10.3 10e3/uL 2.6(L) 2.5(L) 3.1(L)  Hemoglobin 13.0 - 17.1 g/dL 12.1(L) 10.8(L) 11.0(L)  Hematocrit 38.4 - 49.9 % 35.1(L) 32.3(L) 31.8(L)  Platelets 140 - 400 10e3/uL 131(L) 142(L) 134(L)   ANC 300  CMP Latest Ref Rng 01/15/2016 12/28/2015 12/27/2015  Glucose 70 - 140 mg/dl 95 94 104(H)  BUN 7.0 - 26.0 mg/dL 11.'8 12 10  ' Creatinine 0.7 - 1.3 mg/dL 0.9 1.20 0.96  Sodium 136 - 145 mEq/L 143 138 137  Potassium 3.5 - 5.1 mEq/L 3.9 3.6 3.6  Chloride 101 - 111 mmol/L - 106 104  CO2 22 - 29 mEq/L '25 23 23  ' Calcium 8.4 - 10.4 mg/dL 9.4 9.0 8.7(L)  Total Protein 6.4 - 8.3 g/dL 6.4 - -  Total Bilirubin 0.20 - 1.20 mg/dL 0.39 - -  Alkaline Phos 40 - 150 U/L 90 - -  AST 5 - 34 U/L 39(H) - -  ALT 0 - 55 U/L 32 - -   . Lab Results  Component Value Date   LDH 242 01/15/2016   RADIOGRAPHIC STUDIES: I have personally reviewed the radiological images as listed and agreed with the findings in the report. No results found.   PET/CT 11/10/2015: NUCLEAR MEDICINE  PET SKULL BASE TO THIGH  TECHNIQUE: 10.6 mCi F-18 FDG was injected intravenously. Full-ring PET imaging was performed from the skull base to thigh after the radiotracer. CT data was obtained and used for attenuation correction and anatomic localization.  FASTING BLOOD GLUCOSE: Value: 90 mg/dl  COMPARISON: 09/29/2015  FINDINGS: NECK  No hypermetabolic lymph nodes in the neck.  CHEST  No hypermetabolic mediastinal or hilar nodes. No suspicious pulmonary nodules on the CT scan. Posterior right lower lobe subpleural density measures 3.2 cm and has an SUV max equal to 4.84. On the previous exam this measured 4.2 cm and had an SUV max equal to 5.03.  ABDOMEN/PELVIS  No abnormal hypermetabolic activity within the liver, pancreas, adrenal glands, or spleen. Soft tissue stranding is identified within the root of mesenteric. There is a hypermetabolic lymph node within this area which is new from previous exam measuring 11 mm. The SUV max is equal  to 11.75. Also new from previous exam is a focus of increased uptake within the small bowel mesenteric within SUV max of 3.79, image 175. Interval decrease in FDG uptake associated with the previous hypermetabolic left inguinal lymph node. SUV max is equal to 1.66 currently, image number 223. Previously 10.0. SUV max associated with index right inguinal lymph node is equal to 1.78. Previously 2.98. There has been interval improvement and FDG uptake overlying the  SKELETON  Hyper metabolism within the subcutaneous soft tissues overlying the lower sacrum has improved in the interval. SUV max is currently equal to 5.85. Previously 7.3. Decrease FDG uptake associated with postsurgical changes involving the cervical spine. SUV max currently 4.87. Previously 6.1. Increased FDG uptake between the L3 and L4 spinous process ease noted within SUV max equal to 3.8. Previously 4.16.  IMPRESSION: 1. Mixed interval response to  therapy. 2. Interval improvement an FDG uptake associated with bilateral inguinal lymph nodes. 3. New foci of increased uptake within the small bowel mesenteric has an SUV max equal to 11.75. Suspicious for persistent or recurrent disease. 4. Persistent hypermetabolic subpleural density within the posterior right lower lobe. This demonstrates mild decrease in size in the interval but continues to exhibit malignant range FDG uptake. This may be postinflammatory or infectious. Pulmonary manifestation of lymphoma is less favored but not excluded.   Electronically Signed  By: Kerby Moors M.D.  On: 11/10/2015 09:38    ASSESSMENT & PLAN:   1) Diffuse large B-cell lymphoma stage IV AE with involvement of mesenteric, inguinal lymph nodes and C5 vertebral body with spinal cord impingement.  He had a C5 corpectomy on 07/26/2015 that showed diffuse large B-cell lymphoma with a Ki-67 ranging from 20 to 90%. Cytogenetics and Fish showed BCL 2 positivity but negative for BCL 6 and cMYC CD10 positivity suggestive of possibly GCB subtype. Plan Patient received first cycle of R CHOP on 08/08/2015 and intrathecal methotrexate with hydrocortisone on 08/09/2015. Patient subsequently was admitted with ARDS likely due to neulasta -treated with high dose steroids and empirically with broad spectrum antibiotics.  2nd cycle of chemotherapy delayed to allow for rehabilitation (was due on 08/31/2015).  Received R-mini-CHOP for cycle 2 and tolerated it without significant cytopenias. Was admitted briefly for a mild lower extremity cellulitis and DVT. Cellulitis now resolved. Patient completing his oral antibiotics.  PET/CT scan on 09/29/2015 with no evidence of disease progression. Good response in his mesenteric lymph nodes. Inguinal lymph nodes on the right side. Decreased uptake in his C5 level.  No new lesions noted.   Cycle 3 (10/02/2015)  of R- CHOP ( we increased the doxorubicin dose back to 50 mg/m  and keep the cyclophosphamide dose reduction at 400 mg/m]  Cycle 4 (10/23/2015) of R-CHOP ( we increased the doxorubicin dose back to 50 mg/m and kept the cyclophosphamide dose reduction at 400 mg/m].  PET/CT 11/10/2015: Shows improvement in most lesions but is noted to have a new FDG avid lymph node in the mesentery of the small bowel.  Cycle 5 on 11/13/2015 R CHOP - received full dose.  Cycle 6 on 12/11/2015 R-CHOP full dose.  Admitted with neutropenic fevers and treatment for possible early pneumonia.  This results and he was able to discharge home  Plan -Labs and stable with improvement in hemoglobin and stable platelets.mild neutropenia ANC 1100.  Resolving from his last cycle of chemotherapy.  Potentially  To some extent from his current RT. -Currently has completed 5 doses total of 10 planned fractions of  radiation therapy to his C5 lesion and residual abdominal lymph nodes.  2)Acid reflux and gastritis likely related to steroid use -controlled -Continue PPI  3) Decubitus Ulcer over sacral area -nearly completely resolved with optimized nutrition and increased ambulation and now being off chemotherapy. Continue follow-up with wound care clinic  4) Insomnia/Depression - much improved. Continue on Remeron. 8) Significant muscle loss due to steroids/hospitalization/debility-improved significantly.  Patient notes that his weight is back to baseline.Plan  -continue optimized nutrition and PT and regular walking.  9)right lower extremity distal DVT with mild cellulitis. Cellulitis resolved. Patient tolerating Elliquis. Plan -Continue anticoagulation for a total of 6 months till mid march. Would hold if platelet counts less than 50,000 or if bleeding. -no indication for additional chemotherapy at this time. -Complete radiation therapy as planned with Dr. Lisbeth Renshaw. -Return to care with Dr. Irene Limbo in 2 months with CBC, CMP, LDH and repeat PET/CT scan for posttreatment reassessment.earlier if  any new concerns.  I spent 15 minutes counseling the patient face to face. The total time spent in the appointment was 20 minutes and more than 50% was on counseling and direct patient cares.  Sullivan Lone MD [facility]  (Office):       [phone] (Work cell):  [phone] (Fax):           [phone]

## 2016-01-17 ENCOUNTER — Ambulatory Visit
Admission: RE | Admit: 2016-01-17 | Discharge: 2016-01-17 | Disposition: A | Discharge status: Home / Self Care | Payer: PPO | Source: Ambulatory Visit | Attending: Radiation Oncology | Admitting: Radiation Oncology

## 2016-01-17 DIAGNOSIS — Z510 Encounter for antineoplastic radiation therapy: Secondary | ICD-10-CM | POA: Diagnosis not present

## 2016-01-18 ENCOUNTER — Ambulatory Visit
Admission: RE | Admit: 2016-01-18 | Discharge: 2016-01-18 | Disposition: A | Discharge status: Home / Self Care | Payer: PPO | Source: Ambulatory Visit | Attending: Radiation Oncology | Admitting: Radiation Oncology

## 2016-01-18 DIAGNOSIS — Z510 Encounter for antineoplastic radiation therapy: Secondary | ICD-10-CM | POA: Diagnosis not present

## 2016-01-19 ENCOUNTER — Ambulatory Visit
Admission: RE | Admit: 2016-01-19 | Discharge: 2016-01-19 | Disposition: A | Discharge status: Home / Self Care | Payer: PPO | Source: Ambulatory Visit | Attending: Radiation Oncology | Admitting: Radiation Oncology

## 2016-01-19 ENCOUNTER — Encounter: Payer: Self-pay | Admitting: Radiation Oncology

## 2016-01-19 VITALS — BP 116/74 | HR 102 | Temp 98.3°F | Ht 73.0 in | Wt 223.3 lb

## 2016-01-19 DIAGNOSIS — Z510 Encounter for antineoplastic radiation therapy: Secondary | ICD-10-CM | POA: Diagnosis not present

## 2016-01-19 DIAGNOSIS — C8330 Diffuse large B-cell lymphoma, unspecified site: Secondary | ICD-10-CM

## 2016-01-19 DIAGNOSIS — L89153 Pressure ulcer of sacral region, stage 3: Secondary | ICD-10-CM | POA: Diagnosis not present

## 2016-01-19 MED ORDER — MAGIC MOUTHWASH W/LIDOCAINE: 5.0000 mL | Freq: Four times a day (QID) | ORAL | Status: DC | PRN

## 2016-01-19 NOTE — Progress Notes (Signed)
Ethan Stewart is here for his 9/10 fraction of radiation to his C4-C6 Spine and his abdomen. He reports a sore throat related to radiation, and states his voice slightly more hoarse than normal. He reports a decreased appetite, but is able to swallow well. He does report fatigue, and lays on the couch at home most of the time during the day. He denies nausea, vomiting, diarrhea, or bladder difficulties.   BP 116/74 mmHg  Pulse 102  Temp(Src) 98.3 F (36.8 C)  Ht 6\' 1"  (1.854 m)  Wt 223 lb 4.8 oz (101.288 kg)  BMI 29.47 kg/m2   Wt Readings from Last 3 Encounters:  01/19/16 223 lb 4.8 oz (101.288 kg)  01/15/16 225 lb 3.2 oz (102.15 kg)  01/12/16 225 lb (102.059 kg)

## 2016-01-20 ENCOUNTER — Other Ambulatory Visit: Payer: Self-pay | Admitting: Oncology

## 2016-01-21 NOTE — Progress Notes (Signed)
Department of Radiation Oncology  Phone:  [phone] Fax:        [phone]  Weekly Treatment Note    Name: Ethan Stewart Date: 01/21/2016 MRN: [id number] DOB: 1946-11-18   Diagnosis:     ICD-9-CM ICD-10-CM   1. DLBCL (diffuse large B cell lymphoma) (HCC) 202.80 C83.30      Current dose: 27 Gy  Current fraction: 9   MEDICATIONS: Current Outpatient Prescriptions  Medication Sig Dispense Refill  . acetaminophen (TYLENOL) 325 MG tablet Take 2 tablets (650 mg total) by mouth every 6 (six) hours as needed for mild pain, moderate pain, fever or headache (or Fever >/= 101).    [name] Kitchen apixaban (ELIQUIS) 5 MG TABS tablet Take 1 tablet (5 mg total) by mouth 2 (two) times daily. 60 tablet 1  . hyaluronate sodium (RADIAPLEXRX) GEL Apply 1 application topically daily.    [name] Kitchen LORazepam (ATIVAN) 0.5 MG tablet Take 1 tablet (0.5 mg total) by mouth every 8 (eight) hours. 30 tablet 0  . mirtazapine (REMERON) 15 MG tablet Take 1 tablet (15 mg total) by mouth at bedtime. Might increase to 30mg  po HS in 7 days if still having insomnia (Patient taking differently: Take 15 mg by mouth daily as needed (sleep). ) 60 tablet 1  . Multiple Vitamin (MULTIVITAMIN WITH MINERALS) TABS tablet Take 1 tablet by mouth daily.    [name] Kitchen omeprazole (PRILOSEC) 40 MG capsule Take 1 capsule (40 mg total) by mouth daily. 30 capsule 2  . polyethylene glycol (MIRALAX / GLYCOLAX) packet Take 17 g by mouth daily as needed for mild constipation.     . prochlorperazine (COMPAZINE) 10 MG tablet Take 1 tablet (10 mg total) by mouth every 6 (six) hours as needed (Nausea or vomiting). 30 tablet 6  . simvastatin (ZOCOR) 20 MG tablet Take 20 mg by mouth daily.    . temazepam (RESTORIL) 15 MG capsule Take 15 mg by mouth at bedtime and may repeat dose one time if needed.    [name] Kitchen guaiFENesin-dextromethorphan (ROBITUSSIN DM) 100-10 MG/5ML syrup Take 5 mLs by mouth every 4 (four) hours as needed for cough. (Patient not taking: Reported on  01/12/2016) 118 mL 0  . magic mouthwash w/lidocaine SOLN Take 5 mLs by mouth 4 (four) times daily as needed for mouth pain. 250 mL 0  . neomycin-bacitracin-polymyxin (NEOSPORIN) ointment Apply 1 application topically as needed for wound care (left foot blister). Reported on 01/19/2016    . senna-docusate (SENNA S) 8.6-50 MG per tablet Take 2 tablets by mouth at bedtime. (Patient not taking: Reported on 01/12/2016) 60 tablet 1   No current facility-administered medications for this encounter.     ALLERGIES: Neulasta   LABORATORY DATA:  Lab Results  Component Value Date   WBC 2.6* 01/15/2016   HGB 12.1* 01/15/2016   HCT 35.1* 01/15/2016   MCV 83.4 01/15/2016   PLT 131* 01/15/2016   Lab Results  Component Value Date   NA 143 01/15/2016   K 3.9 01/15/2016   CL 106 12/28/2015   CO2 25 01/15/2016   Lab Results  Component Value Date   ALT 32 01/15/2016   AST 39* 01/15/2016   ALKPHOS 90 01/15/2016   BILITOT 0.39 01/15/2016     NARRATIVE: Ethan Stewart was seen today for weekly treatment management. The chart was checked and the patient's films were reviewed.  Ethan Stewart is here for his 9/10 fraction of radiation to his C4-C6 Spine and his abdomen. He reports a sore throat related  to radiation, and states his voice slightly more hoarse than normal. He reports a decreased appetite, but is able to swallow well. He does report fatigue, and lays on the couch at home most of the time during the day. He denies nausea, vomiting, diarrhea, or bladder difficulties.   BP 116/74 mmHg  Pulse 102  Temp(Src) 98.3 F (36.8 C)  Ht 6\' 1"  (1.854 m)  Wt 223 lb 4.8 oz (101.288 kg)  BMI 29.47 kg/m2   Wt Readings from Last 3 Encounters:  01/19/16 223 lb 4.8 oz (101.288 kg)  01/15/16 225 lb 3.2 oz (102.15 kg)  01/12/16 225 lb (102.059 kg)    PHYSICAL EXAMINATION: height is 6\' 1"  (1.854 m) and weight is 223 lb 4.8 oz (101.288 kg). His temperature is 98.3 F (36.8 C). His blood pressure is  116/74 and his pulse is 102.        ASSESSMENT: The patient is doing satisfactorily with treatment.  PLAN: We will continue with the patient's radiation treatment as planned.

## 2016-01-22 ENCOUNTER — Ambulatory Visit
Admission: RE | Admit: 2016-01-22 | Discharge: 2016-01-22 | Disposition: A | Discharge status: Home / Self Care | Payer: PPO | Source: Ambulatory Visit | Attending: Radiation Oncology | Admitting: Radiation Oncology

## 2016-01-22 ENCOUNTER — Encounter: Payer: Self-pay | Admitting: Radiation Oncology

## 2016-01-22 DIAGNOSIS — Z510 Encounter for antineoplastic radiation therapy: Secondary | ICD-10-CM | POA: Diagnosis not present

## 2016-01-23 ENCOUNTER — Telehealth: Payer: Self-pay | Admitting: *Deleted

## 2016-01-23 NOTE — Telephone Encounter (Signed)
Patient wife called asking about anything else for patient's difficulty swallowing, , repeated that he will have this for a few more weeks for throat to feel better and to continue the MMW 4x day, , wife asked for refill to be called to [facility] on [address], saw that Dr. Lisbeth Renshaw wrote on 01/19/16 [facility], called [facility] and spoke with a pharmacy tech, they didn't get the rx request not showing  In their data, thanked them and called [facility]  Left voice message for MMW rx with lidocaine soln, 63ml benadryl, 35ml of nystatin, 80 ml extra st maalox+, and 240 ml viscous lidocaine total 580 ml,  , can call back for any questions, then called and spoke with the patient, he was informed rx called to their preference at [facility] [address],  Patient thanked this RN for returning the call so soon   12:55 PM

## 2016-02-02 ENCOUNTER — Encounter (HOSPITAL_BASED_OUTPATIENT_CLINIC_OR_DEPARTMENT_OTHER): Payer: PPO | Attending: Internal Medicine

## 2016-02-02 DIAGNOSIS — Z9221 Personal history of antineoplastic chemotherapy: Secondary | ICD-10-CM | POA: Diagnosis not present

## 2016-02-02 DIAGNOSIS — L89153 Pressure ulcer of sacral region, stage 3: Secondary | ICD-10-CM | POA: Diagnosis not present

## 2016-02-02 DIAGNOSIS — Z86718 Personal history of other venous thrombosis and embolism: Secondary | ICD-10-CM | POA: Diagnosis not present

## 2016-02-02 DIAGNOSIS — M109 Gout, unspecified: Secondary | ICD-10-CM | POA: Diagnosis not present

## 2016-02-14 NOTE — Progress Notes (Signed)
  Radiation Oncology         [phone] ________________________________  Name: Ethan Stewart MRN: [id number]  Date: 01/22/2016  DOB: [birth year]  End of Treatment Note  Diagnosis:   Lymphoma      Indication for treatment::  palliative       Radiation treatment dates:   01/09/2016 through 01/22/2016  Site/dose:   The patient was treated to the C4-C6 levels of the spine to a dose of 30 gray in 10 fractions using a 2 field technique. The patient was also treated to an abdominal lymph node region to a dose of 30 gray in 10 fractions using a 3 field technique. This treatment consisted of a 3-D conformal technique with daily image guidance utilized.  Narrative: The patient tolerated radiation treatment relatively well.     Plan: The patient has completed radiation treatment. The patient will return to radiation oncology clinic for routine followup in one month. I advised the patient to call or return sooner if they have any questions or concerns related to their recovery or treatment. ________________________________  Jodelle Gross, M.D., Ph.D.

## 2016-02-16 DIAGNOSIS — L89153 Pressure ulcer of sacral region, stage 3: Secondary | ICD-10-CM | POA: Diagnosis not present

## 2016-02-19 ENCOUNTER — Ambulatory Visit
Admission: RE | Admit: 2016-02-19 | Discharge: 2016-02-19 | Disposition: A | Discharge status: Home / Self Care | Payer: PPO | Source: Ambulatory Visit | Attending: Radiation Oncology | Admitting: Radiation Oncology

## 2016-02-19 ENCOUNTER — Encounter: Payer: Self-pay | Admitting: Radiation Oncology

## 2016-02-19 VITALS — BP 125/80 | HR 85 | Temp 98.3°F | Resp 20 | Ht 73.0 in | Wt 219.4 lb

## 2016-02-19 DIAGNOSIS — R1310 Dysphagia, unspecified: Secondary | ICD-10-CM

## 2016-02-19 DIAGNOSIS — D492 Neoplasm of unspecified behavior of bone, soft tissue, and skin: Secondary | ICD-10-CM

## 2016-02-19 DIAGNOSIS — C8330 Diffuse large B-cell lymphoma, unspecified site: Secondary | ICD-10-CM

## 2016-02-19 NOTE — Patient Instructions (Signed)
Contact our office if you have any questions following today's appointment: [phone].  

## 2016-02-19 NOTE — Progress Notes (Signed)
follw up  S/p rad txs C4--C6 01/09/16-01/22/16, no taste or appetite in foods, but does eat , throat raspy, is swallowing better, not taking mmw, stated it didn't  Help>, energy level better  Walking daily and going to the [facility],   sees Dr. Irene Limbo 03/14/16, possible Pet to be ordered BP 125/80 mmHg  Pulse 85  Temp(Src) 98.3 F (36.8 C) (Oral)  Resp 20  Ht 6\' 1"  (1.854 m)  Wt 219 lb 6.4 oz (99.519 kg)  BMI 28.95 kg/m2  SpO2 99%  Wt Readings from Last 3 Encounters:  02/19/16 219 lb 6.4 oz (99.519 kg)  01/19/16 223 lb 4.8 oz (101.288 kg)  01/15/16 225 lb 3.2 oz (102.15 kg)

## 2016-02-19 NOTE — Progress Notes (Signed)
Radiation Oncology         [phone] ________________________________  Name: Ethan Stewart MRN: [id number]  Date: 02/19/2016  DOB: [birth year]  Follow-Up Visit Note  CC:  Melinda Crutch, MD  Brunetta Genera, MD  Diagnosis: Lymphoma with mets to the C-spine and abdominal adenopathy.  Interval Since Last Radiation: 1 month.  01/09/2016 through 01/22/2016: The patient was treated to the C4-C6 levels of the spine to a dose of 30 gray in 10 fractions using a 2 field technique. The patient was also treated to an abdominal lymph node region to a dose of 30 gray in 10 fractions using a 3 field technique. This treatment consisted of a 3-D conformal technique with daily image guidance utilized.  Narrative:  The patient returns today for routine follow-up. He plans to see Dr. Irene Limbo on 03/14/16 and have a PET scan prior to this visit.   On review of systems, the patient reports poor taste and a poor appetite. He continues to tolerate different foods and is trying milk shakes and boost to try to gain the weight he previously lost back. He is concerned about the depth of his voice and hopes that this will improve over time. He feels that his energy level is improving and he is walking daily and going to the [facility]. His skin has improved as well. A complete review of systems is obtained and is otherwise negative.  ALLERGIES:  is allergic to neulasta.  Meds: Current Outpatient Prescriptions  Medication Sig Dispense Refill  . acetaminophen (TYLENOL) 325 MG tablet Take 2 tablets (650 mg total) by mouth every 6 (six) hours as needed for mild pain, moderate pain, fever or headache (or Fever >/= 101).    [name] Kitchen apixaban (ELIQUIS) 5 MG TABS tablet Take 1 tablet (5 mg total) by mouth 2 (two) times daily. 60 tablet 1  . LORazepam (ATIVAN) 0.5 MG tablet Take 1 tablet (0.5 mg total) by mouth every 8 (eight) hours. 30 tablet 0  . mirtazapine (REMERON) 15 MG tablet Take 1 tablet (15 mg total) by mouth at bedtime. Might  increase to 30mg  po HS in 7 days if still having insomnia (Patient taking differently: Take 15 mg by mouth daily as needed (sleep). ) 60 tablet 1  . Multiple Vitamin (MULTIVITAMIN WITH MINERALS) TABS tablet Take 1 tablet by mouth daily. Reported on 02/19/2016    . simvastatin (ZOCOR) 20 MG tablet Take 20 mg by mouth daily.    . temazepam (RESTORIL) 15 MG capsule Take 15 mg by mouth at bedtime and may repeat dose one time if needed.    [name] Kitchen guaiFENesin-dextromethorphan (ROBITUSSIN DM) 100-10 MG/5ML syrup Take 5 mLs by mouth every 4 (four) hours as needed for cough. (Patient not taking: Reported on 01/12/2016) 118 mL 0  . hyaluronate sodium (RADIAPLEXRX) GEL Apply 1 application topically daily. Reported on 02/19/2016    . magic mouthwash w/lidocaine SOLN Take 5 mLs by mouth 4 (four) times daily as needed for mouth pain. (Patient not taking: Reported on 02/19/2016) 250 mL 0  . neomycin-bacitracin-polymyxin (NEOSPORIN) ointment Apply 1 application topically as needed for wound care (left foot blister). Reported on 02/19/2016    . omeprazole (PRILOSEC) 40 MG capsule Take 1 capsule (40 mg total) by mouth daily. (Patient not taking: Reported on 02/19/2016) 30 capsule 2  . polyethylene glycol (MIRALAX / GLYCOLAX) packet Take 17 g by mouth daily as needed for mild constipation. Reported on 02/19/2016    . prochlorperazine (COMPAZINE) 10 MG tablet Take  1 tablet (10 mg total) by mouth every 6 (six) hours as needed (Nausea or vomiting). 30 tablet 6  . senna-docusate (SENNA S) 8.6-50 MG per tablet Take 2 tablets by mouth at bedtime. (Patient not taking: Reported on 01/12/2016) 60 tablet 1   No current facility-administered medications for this encounter.    Physical Findings:  height is 6\' 1"  (1.854 m) and weight is 219 lb 6.4 oz (99.519 kg). His oral temperature is 98.3 F (36.8 C). His blood pressure is 125/80 and his pulse is 85. His respiration is 20 and oxygen saturation is 99%. .   Pain scale 0/10 In general this  is a well appearing caucasian male in no acute distress. He is alert and oriented x4 and appropriate throughout the examination. HEENT reveals that the patient is normocephalic, atraumatic. EOMs are intact. PERRLA. Oral mucosa is intact without evidence of plauqes or ulcers. Skin is intact without any evidence of gross lesions, and over the neck anteriorly and posteriorly, the previously noted erythema has improved. Cardiopulmonary is negative for distress, and the patient exhibits normal effort.   Lab Findings: Lab Results  Component Value Date   WBC 2.6* 01/15/2016   HGB 12.1* 01/15/2016   HCT 35.1* 01/15/2016   MCV 83.4 01/15/2016   PLT 131* 01/15/2016     Radiographic Findings: No results found.  Impression: The patient is recovering from the effects of radiation.  Plan: He is scheduled to see Dr. Irene Limbo on 03/14/16. Dr. Irene Limbo has also scheduled a repeat PET/CT scan in the near future for post-treatment reassessment. We will release the patient back to medical oncology and see him back as needed in the future.  The above documentation reflects my direct findings during this shared patient visit. Please see the separate note by Dr. Lisbeth Renshaw on this date for the remainder of the patient's plan of care.  Carola Rhine, PAC   This document serves as a record of services personally performed by Shona Simpson, PAC and Kyung Rudd, MD. It was created on their behalf by Darcus Austin, a trained medical scribe. The creation of this record is based on the scribe's personal observations and the provider's statements to them. This document has been checked and approved by the attending provider.

## 2016-02-21 ENCOUNTER — Other Ambulatory Visit: Payer: Self-pay

## 2016-02-21 NOTE — Patient Outreach (Signed)
[facility]) Care Management  02/21/2016  Ethan Stewart [birth year] [id number]   Referral Date:  02/08/2016 Source:  HTA Tier 4 Issue:  Hospital Admission and ED utilization  Providers: Primary MD:  Dr. Lona Kettle - next appt:  2017 for yearly Oncologist:  appt q 3 months.  Wound MD:  Dr. Dorthula Rue - appt every 2 weeks  HH:  None  Insurance:  [employer]   Social: Patient is married and lives in his home with his wife.  Mobility: Ambulates without any assistive devices.  Falls: None  Pain: None since radiation to throat area resolved.  Transportation:  Self or wife Caregiver: wife DME: wound supplies  THN conditions:   Admissions: 3 ER visits: 0 Last admission date:  12/26/2015 - 12/29/2015  - Febrile neutropenia, Possible right middle lobe pneumonia on chest x-ray. Other:  H/o DLBCL (diffuse large B cell lymphoma), GERD (gastroesophageal reflux disease), Pressure ulcer/sacral H/o Spinal tumor surgery 05/2016 followed with Chemotherapy and radiation.  H/o ARDS requiring 9 day ICU admission due to complications associate to medication side effect.   Discharged to IP/SNF [facility] for 3 weeks and transitioned to home with home PT services for one month.  States recovered and going to the gym everyday.  Wound healing and states much smaller and dressed by wife daily.   Medications:  Patient taking less than 10 medications  Co-pay cost issues: none  Flu Vaccine: 10/17/2015 Pneumonia Vaccine: PCV13 09/07/2014 and PPSV23 07/16/2012  Consent: Patient stated he has no needs for [facility] services at this time.  Patient stated he is doing well and will notify his MD should his condition change and THN services be needed.   Plan:  Case closed:  Assessed, no needs identified.  ([place] Management Assistant notified) Patient has no THN condition and Oncology needs stabilized.  Primary MD, Dr. Harrington Challenger notified via case closure letter.    RN CM advised to  please notify MD of any changes in condition prior to scheduled appt's.   RN CM provided contact name and # [phone] or main office # [phone] and 24-hour nurse line # [phone].  RN CM confirmed patient is aware of 911 services for urgent emergency needs.  Mariann Laster, RN, BSN, [facility], CCM  [employer] Management Coordinator [phone] Direct [phone] Cell [phone] Office [phone] Fax

## 2016-02-22 ENCOUNTER — Other Ambulatory Visit: Payer: Self-pay | Admitting: Hematology

## 2016-03-01 ENCOUNTER — Encounter (HOSPITAL_BASED_OUTPATIENT_CLINIC_OR_DEPARTMENT_OTHER): Payer: PPO | Attending: Internal Medicine

## 2016-03-01 DIAGNOSIS — Z86718 Personal history of other venous thrombosis and embolism: Secondary | ICD-10-CM | POA: Insufficient documentation

## 2016-03-01 DIAGNOSIS — M109 Gout, unspecified: Secondary | ICD-10-CM | POA: Insufficient documentation

## 2016-03-01 DIAGNOSIS — Z9221 Personal history of antineoplastic chemotherapy: Secondary | ICD-10-CM | POA: Insufficient documentation

## 2016-03-01 DIAGNOSIS — L89150 Pressure ulcer of sacral region, unstageable: Secondary | ICD-10-CM | POA: Insufficient documentation

## 2016-03-12 ENCOUNTER — Ambulatory Visit (HOSPITAL_COMMUNITY)
Admission: RE | Admit: 2016-03-12 | Discharge: 2016-03-12 | Disposition: A | Discharge status: Home / Self Care | Payer: PPO | Source: Ambulatory Visit | Attending: Hematology | Admitting: Hematology

## 2016-03-12 DIAGNOSIS — C8330 Diffuse large B-cell lymphoma, unspecified site: Secondary | ICD-10-CM | POA: Diagnosis not present

## 2016-03-12 DIAGNOSIS — R911 Solitary pulmonary nodule: Secondary | ICD-10-CM | POA: Diagnosis not present

## 2016-03-12 LAB — GLUCOSE, CAPILLARY: Glucose-Capillary: 72 mg/dL (ref 65–99)

## 2016-03-12 MED ORDER — FLUDEOXYGLUCOSE F - 18 (FDG) INJECTION
10.7000 | Freq: Once | INTRAVENOUS | Status: AC | PRN
  Administered 2016-03-12: 10.7 via INTRAVENOUS

## 2016-03-14 ENCOUNTER — Telehealth: Payer: Self-pay | Admitting: Hematology

## 2016-03-14 ENCOUNTER — Other Ambulatory Visit (HOSPITAL_BASED_OUTPATIENT_CLINIC_OR_DEPARTMENT_OTHER): Payer: PPO

## 2016-03-14 ENCOUNTER — Encounter: Payer: Self-pay | Admitting: Hematology

## 2016-03-14 ENCOUNTER — Ambulatory Visit (HOSPITAL_BASED_OUTPATIENT_CLINIC_OR_DEPARTMENT_OTHER): Payer: PPO

## 2016-03-14 ENCOUNTER — Ambulatory Visit (HOSPITAL_BASED_OUTPATIENT_CLINIC_OR_DEPARTMENT_OTHER): Payer: PPO | Admitting: Hematology

## 2016-03-14 VITALS — BP 140/77 | HR 77 | Temp 98.0°F | Resp 18 | Ht 73.0 in | Wt 223.5 lb

## 2016-03-14 DIAGNOSIS — D696 Thrombocytopenia, unspecified: Secondary | ICD-10-CM

## 2016-03-14 DIAGNOSIS — C8330 Diffuse large B-cell lymphoma, unspecified site: Secondary | ICD-10-CM

## 2016-03-14 DIAGNOSIS — C8338 Diffuse large B-cell lymphoma, lymph nodes of multiple sites: Secondary | ICD-10-CM | POA: Diagnosis not present

## 2016-03-14 DIAGNOSIS — F329 Major depressive disorder, single episode, unspecified: Secondary | ICD-10-CM

## 2016-03-14 DIAGNOSIS — M6250 Muscle wasting and atrophy, not elsewhere classified, unspecified site: Secondary | ICD-10-CM

## 2016-03-14 DIAGNOSIS — K2970 Gastritis, unspecified, without bleeding: Secondary | ICD-10-CM | POA: Diagnosis not present

## 2016-03-14 DIAGNOSIS — G4700 Insomnia, unspecified: Secondary | ICD-10-CM

## 2016-03-14 DIAGNOSIS — K219 Gastro-esophageal reflux disease without esophagitis: Secondary | ICD-10-CM

## 2016-03-14 DIAGNOSIS — Z95828 Presence of other vascular implants and grafts: Secondary | ICD-10-CM

## 2016-03-14 DIAGNOSIS — Z86718 Personal history of other venous thrombosis and embolism: Secondary | ICD-10-CM

## 2016-03-14 LAB — CBC & DIFF AND RETIC
BASO%: 0.5 % (ref 0.0–2.0)
Basophils Absolute: 0 10*3/uL (ref 0.0–0.1)
EOS ABS: 0.1 10*3/uL (ref 0.0–0.5)
EOS%: 1.8 % (ref 0.0–7.0)
HCT: 35.6 % — ABNORMAL LOW (ref 38.4–49.9)
HEMOGLOBIN: 12.1 g/dL — AB (ref 13.0–17.1)
IMMATURE RETIC FRACT: 4.4 % (ref 3.00–10.60)
LYMPH%: 23.9 % (ref 14.0–49.0)
MCH: 30 pg (ref 27.2–33.4)
MCHC: 34 g/dL (ref 32.0–36.0)
MCV: 88.1 fL (ref 79.3–98.0)
MONO#: 0.6 10*3/uL (ref 0.1–0.9)
MONO%: 14.7 % — AB (ref 0.0–14.0)
NEUT%: 59.1 % (ref 39.0–75.0)
NEUTROS ABS: 2.3 10*3/uL (ref 1.5–6.5)
PLATELETS: 118 10*3/uL — AB (ref 140–400)
RBC: 4.04 10*6/uL — ABNORMAL LOW (ref 4.20–5.82)
RDW: 16.3 % — ABNORMAL HIGH (ref 11.0–14.6)
Retic %: 0.97 % (ref 0.80–1.80)
Retic Ct Abs: 39.19 10*3/uL (ref 34.80–93.90)
WBC: 3.9 10*3/uL — AB (ref 4.0–10.3)
lymph#: 0.9 10*3/uL (ref 0.9–3.3)

## 2016-03-14 LAB — COMPREHENSIVE METABOLIC PANEL
ALBUMIN: 3.7 g/dL (ref 3.5–5.0)
ALT: 23 U/L (ref 0–55)
ANION GAP: 5 meq/L (ref 3–11)
AST: 29 U/L (ref 5–34)
Alkaline Phosphatase: 95 U/L (ref 40–150)
BILIRUBIN TOTAL: 0.43 mg/dL (ref 0.20–1.20)
BUN: 13.3 mg/dL (ref 7.0–26.0)
CHLORIDE: 110 meq/L — AB (ref 98–109)
CO2: 27 mEq/L (ref 22–29)
CREATININE: 0.9 mg/dL (ref 0.7–1.3)
Calcium: 9.1 mg/dL (ref 8.4–10.4)
EGFR: 82 mL/min/{1.73_m2} — AB (ref >90)
Glucose: 92 mg/dl (ref 70–140)
Potassium: 4.2 mEq/L (ref 3.5–5.1)
SODIUM: 142 meq/L (ref 136–145)
TOTAL PROTEIN: 6.1 g/dL — AB (ref 6.4–8.3)

## 2016-03-14 LAB — LACTATE DEHYDROGENASE: LDH: 188 U/L (ref 125–245)

## 2016-03-14 MED ORDER — SODIUM CHLORIDE 0.9% FLUSH
10.0000 mL | INTRAVENOUS | Status: DC | PRN
  Administered 2016-03-14: 10 mL via INTRAVENOUS
  Filled 2016-03-14: qty 10

## 2016-03-14 MED ORDER — MIRTAZAPINE 15 MG PO TABS: 15.0000 mg | ORAL_TABLET | Freq: Every day | ORAL | Status: DC

## 2016-03-14 MED ORDER — HEPARIN SOD (PORK) LOCK FLUSH 100 UNIT/ML IV SOLN
500.0000 [IU] | Freq: Once | INTRAVENOUS | Status: AC
  Administered 2016-03-14: 500 [IU] via INTRAVENOUS
  Filled 2016-03-14: qty 5

## 2016-03-14 MED ORDER — ASPIRIN EC 81 MG PO TBEC: 81.0000 mg | DELAYED_RELEASE_TABLET | Freq: Every day | ORAL | Status: DC

## 2016-03-14 NOTE — Patient Instructions (Signed)

## 2016-03-14 NOTE — Telephone Encounter (Signed)
per pof to sch pt appt-gave pt copy of avs °

## 2016-03-18 ENCOUNTER — Telehealth: Payer: Self-pay | Admitting: Hematology

## 2016-03-18 NOTE — Telephone Encounter (Signed)
Faxed pt medical records to [facility]. Slides and scans will be fedex'ed.

## 2016-03-18 NOTE — Progress Notes (Signed)
Ethan Stewart  HEMATOLOGY ONCOLOGY PROGRESS NOTE  Date of service  .03/14/2016    Patient Care Team: Lona Kettle, MD as PCP - General (Family Medicine) Brunetta Genera, MD as Consulting Physician (Hematology)  CC: follow for diffuse large B-cell lymphoma  Diagnosis:   1) Diffuse large B-cell lymphoma Stage IVAE with extranodal involvement of C5 vertebra status post C5 corpectomy. 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  He uneventfully completed his radiation therapy to his C5 lesion and also to the visible abdominal lymph node.  He notes no acute new concerns at this time.  His sacral document this ulcer is completely healed.  He is eating well and he is regrowing as hair. He notes that he is walking regularly 1-2 miles every day. No new lymphadenopathy.  No night sweats. LDH level remains within normal limits. (188). PET/CT scan show some small hypermetabolic lymph nodes along the left jejunal mesentery - cannot rule out disease progression.  This is asymptomatic at this time.  REVIEW OF SYSTEMS:   10 point review of systems was done and is negative except as noted above  I have reviewed the past medical history, past surgical history, social history and family history with the patient and they are unchanged from previous note.  ALLERGIES:  is allergic to neulasta.  MEDICATIONS:  Current Outpatient Prescriptions  Medication Sig Dispense Refill  . Multiple Vitamin (MULTIVITAMIN WITH MINERALS) TABS tablet Take 1 tablet by mouth daily. Reported on 02/19/2016    . simvastatin (ZOCOR) 20 MG tablet Take 20 mg by mouth daily.    . temazepam (RESTORIL) 15 MG capsule  Take 15 mg by mouth at bedtime and may repeat dose one time if needed.    Ethan Stewart aspirin EC 81 MG tablet Take 1 tablet (81 mg total) by mouth daily. 30 tablet 11  . mirtazapine (REMERON) 15 MG tablet Take 1 tablet (15 mg total) by mouth at bedtime. Might increase to 58m po HS in 7 days if still having insomnia 30 tablet 2   No current facility-administered medications for this visit.    PHYSICAL EXAMINATION: ECOG PERFORMANCE STATUS: 1 - Symptomatic but completely ambulatory  Filed Vitals:   03/14/16 0908  BP: 140/77  Pulse: 77  Temp: 98 F (36.7 C)  Resp: 18   Filed Weights   03/14/16 0908  Weight: 223 lb 8 oz (101.379 kg)    GENERAL:alert, no distress and comfortable SKIN: no acute rashes. Sacral decubitus ulcer not examined today -patient following wound care and notes that it is nearly completely healed. EYES: normal, Conjunctiva are pink and non-injected, sclera clear OROPHARYNX:no exudate, no erythema and lips, buccal mucosa, and tongue normal  NECK: supple, thyroid normal size, non-tender, without nodularity LYMPH:no palpable cervical, axillary or inguinal lymphadenopathy . LUNGS: clear to auscultation and percussion with normal breathing effort HEART: regular rate & rhythm and no murmurs and no lower extremity edema ABDOMEN:abdomen soft, non-tender and normal bowel sounds Musculoskeletal:no cyanosis of digits and no clubbing  NEURO: alert & oriented x 3 with fluent speech, 4+/5 weakness right upper extremity abduction-significantly improved strength nearly back to baseline  LABORATORY DATA:   CBC Latest Ref Rng  03/14/2016 01/15/2016 12/29/2015  WBC 4.0 - 10.3 10e3/uL 3.9(L) 2.6(L) 2.5(L)  Hemoglobin 13.0 - 17.1 g/dL 12.1(L) 12.1(L) 10.8(L)  Hematocrit 38.4 - 49.9 % 35.6(L) 35.1(L) 32.3(L)  Platelets 140 - 400 10e3/uL 118(L) 131(L) 142(L)   ANC 300  CMP Latest Ref Rng 03/14/2016 01/15/2016 12/28/2015  Glucose 70 - 140 mg/dl 92 95 94  BUN 7.0 - 26.0 mg/dL 13.3 11.8 12    Creatinine 0.7 - 1.3 mg/dL 0.9 0.9 1.20  Sodium 136 - 145 mEq/L 142 143 138  Potassium 3.5 - 5.1 mEq/L 4.2 3.9 3.6  Chloride 101 - 111 mmol/L - - 106  CO2 22 - 29 mEq/L _0 Calcium 8.4 - 10.4 mg/dL 9.1 9.4 9.0  Total Protein 6.4 - 8.3 g/dL 6.1(L) 6.4 -  Total Bilirubin 0.20 - 1.20 mg/dL 0.43 0.39 -  Alkaline Phos 40 - 150 U/L 95 90 -  AST 5 - 34 U/L 29 39(H) -  ALT 0 - 55 U/L 23 32 -   . Lab Results  Component Value Date   LDH 188 03/14/2016   RADIOGRAPHIC STUDIES: I have personally reviewed the radiological images as listed and agreed with the findings in the report. No results found.  \  PET/CT 03/12/2016 CLINICAL DATA: Subsequent treatment strategy for diffuse large B-cell lymphoma.  EXAM: NUCLEAR MEDICINE PET SKULL BASE TO THIGH  TECHNIQUE: 10.7 mCi F-18 FDG was injected intravenously. Full-ring PET imaging was performed from the skull base to thigh after the radiotracer. CT data was obtained and used for attenuation correction and anatomic localization.  FASTING BLOOD GLUCOSE: Value: 72 mg/dl  COMPARISON: PET-CT dated 11/28/2015  FINDINGS: NECK  No hypermetabolic lymph nodes in the neck.  CHEST  1.1 x 2.2 mm nodule in the posterior right lung base (series 6/ image 55), max SUV 3.3, previously 1.2 x 2.9 cm with max SUV 4.2.  Two additional right lower lobe nodules measure 3-4 mm (series 6/image 29) and 5 mm (series 6/image 49), grossly unchanged.  Mild dependent atelectasis in the left lower lobe. The heart is top-normal in size. No pericardial effusion. No hypermetabolic thoracic lymphadenopathy. Right chest port terminates at the cavoatrial junction.  ABDOMEN/PELVIS  No abnormal hypermetabolic activity within the liver, pancreas, adrenal glands, or spleen.  Mild pneumobilia. Atherosclerotic calcifications the abdominal aorta. Prostatomegaly, with enlargement of the central gland which indents the base the bladder.  Postsurgical changes related to bilateral inguinal hernia repair.  Progression of small lymph nodes along the jejunal mesentery in the left mid abdomen. Index 9 mm short axis node in the left mid abdomen with max SUV 9.0, previously 10 mm with max SUV 12.7. However, there are additional scattered new lymph nodes measuring 7-8 mm short axis (for example, series 4/images 124, 127, 134, 151, and 152), max SUV 8.2.  SKELETON  No focal hypermetabolic activity to suggest skeletal metastasis.  IMPRESSION: Progression of small hypermetabolic lymph nodes along the left jejunal mesentery.  1.1 x 2.2 cm nodule in the posterior right lung base, suspicious for pulmonary lymphoma, decreased.   Electronically Signed  By: Julian Hy M.D.  On: 03/12/2016 09:03   PET/CT 11/10/2015: NUCLEAR MEDICINE PET SKULL BASE TO THIGH  TECHNIQUE: 10.6 mCi F-18 FDG was injected intravenously. Full-ring PET imaging was performed from the skull base to thigh after the radiotracer. CT data was obtained and used for attenuation correction and anatomic localization.  FASTING BLOOD GLUCOSE: Value: 90 mg/dl  COMPARISON: 09/29/2015  FINDINGS: NECK  No hypermetabolic lymph nodes  in the neck.  CHEST  No hypermetabolic mediastinal or hilar nodes. No suspicious pulmonary nodules on the CT scan. Posterior right lower lobe subpleural density measures 3.2 cm and has an SUV max equal to 4.84. On the previous exam this measured 4.2 cm and had an SUV max equal to 5.03.  ABDOMEN/PELVIS  No abnormal hypermetabolic activity within the liver, pancreas, adrenal glands, or spleen. Soft tissue stranding is identified within the root of mesenteric. There is a hypermetabolic lymph node within this area which is new from previous exam measuring 11 mm. The SUV max is equal to 11.75. Also new from previous exam is a focus of increased uptake within the small bowel mesenteric within SUV max  of 3.79, image 175. Interval decrease in FDG uptake associated with the previous hypermetabolic left inguinal lymph node. SUV max is equal to 1.66 currently, image number 223. Previously 10.0. SUV max associated with index right inguinal lymph node is equal to 1.78. Previously 2.98. There has been interval improvement and FDG uptake overlying the  SKELETON  Hyper metabolism within the subcutaneous soft tissues overlying the lower sacrum has improved in the interval. SUV max is currently equal to 5.85. Previously 7.3. Decrease FDG uptake associated with postsurgical changes involving the cervical spine. SUV max currently 4.87. Previously 6.1. Increased FDG uptake between the L3 and L4 spinous process ease noted within SUV max equal to 3.8. Previously 4.16.  IMPRESSION: 1. Mixed interval response to therapy. 2. Interval improvement an FDG uptake associated with bilateral inguinal lymph nodes. 3. New foci of increased uptake within the small bowel mesenteric has an SUV max equal to 11.75. Suspicious for persistent or recurrent disease. 4. Persistent hypermetabolic subpleural density within the posterior right lower lobe. This demonstrates mild decrease in size in the interval but continues to exhibit malignant range FDG uptake. This may be postinflammatory or infectious. Pulmonary manifestation of lymphoma is less favored but not excluded.   Electronically Signed  By: Signa Kell M.D.  On: 11/10/2015 09:38    ASSESSMENT & PLAN:   1) Diffuse large B-cell lymphoma stage IV AE with involvement of mesenteric, inguinal lymph nodes and C5 vertebral body with spinal cord impingement.  He had a C5 corpectomy on 07/26/2015 that showed diffuse large B-cell lymphoma with a Ki-67 ranging from 20 to 90%. Cytogenetics and Fish showed BCL 2 positivity but negative for BCL 6 and cMYC CD10 positivity suggestive of possibly GCB subtype.  Patient received first cycle of R CHOP on  08/08/2015 and intrathecal methotrexate with hydrocortisone on 08/09/2015. Patient subsequently was admitted with ARDS likely due to neulasta -treated with high dose steroids and empirically with broad spectrum antibiotics.  2nd cycle of chemotherapy delayed to allow for rehabilitation (was due on 08/31/2015).  Received R-mini-CHOP for cycle 2 and tolerated it without significant cytopenias. Was admitted briefly for a mild lower extremity cellulitis and DVT. Cellulitis now resolved. Patient completing his oral antibiotics.  PET/CT scan on 09/29/2015 with no evidence of disease progression. Good response in his mesenteric lymph nodes. Inguinal lymph nodes on the right side. Decreased uptake in his C5 level.  No new lesions noted.   Cycle 3 (10/02/2015)  of R- CHOP ( we increased the doxorubicin dose back to 50 mg/m and keep the cyclophosphamide dose reduction at 400 mg/m]  Cycle 4 (10/23/2015) of R-CHOP ( we increased the doxorubicin dose back to 50 mg/m and kept the cyclophosphamide dose reduction at 400 mg/m].  PET/CT 11/10/2015: Shows improvement in most lesions but  is noted to have a new FDG avid lymph node in the mesentery of the small bowel.  Cycle 5 on 11/13/2015 R CHOP - received full dose.  Cycle 6 on 12/11/2015 R-CHOP full dose.  Admitted with neutropenic fevers and treatment for possible early pneumonia.  This resolved and he was able to discharge home  01/09/2016 through 01/22/2016: The patient was treated to the C4-C6 levels of the spine to a dose of 30 gray in 10 fractions using a 2 field technique. The patient was also treated to an abdominal lymph node region to a dose of 30 gray in 10 fractions using a 3 field technique. This treatment consisted of a 3-D conformal technique with daily image guidance utilized.   Plan -patient notes that he is feeling very well and has no clinical evidence of lymphoma recurrence/progression at this time.  His LDH levels are within normal limits.   No constitutional symptoms. -His PET/CT scan however show some hypermetabolic mesenteric lymph nodes which are somewhat concerning.  These aren't easy to biopsy currently given their size and location. -We discussed getting a second opinion at [facility] given the complexity of his case.   -If true progression of disease is noted this would be an indication for consideration of possible Auto HSCT  And we would like a 2nd opinion to weigh in on this. We discussed that typically most regimens done in the second line setting (eg R-ICE) typically require growth factor support which he cannot have due to his G-CSF related ARDS/Capillary leak syndrome. In general the disease has to be shown to be chemosensitive prior to transplant however if the disease burden is low one could potential be considered for directly going to [facility]/Auto on a case by case basis.  -we discussed that if he is deemed not to be a transplant candidate he would be looking at other treatment regimens that have much lower chances of achieving low term CR.   2)Acid reflux and gastritis likely related to steroid use -controlled -Continue PPI  3) Decubitus Ulcer over sacral area - completely resolved with optimized nutrition and increased ambulation and now being off chemotherapy. 4) Insomnia/Depression - much improved. Continue on Remeron. 5) Significant muscle loss due to steroids/hospitalization/debility- has improved back to baseline now with improved physical activity  -continue optimized nutrition and  regular walking/exercise.  9)right lower extremity distal DVT. Resolved. Completed 6 months of treatment.  10) Mild thrombocytopenia- monitoring.  -Return to care with Dr. Irene Limbo in 1 months with CBC, CMP, LDH. We will adjust followup plan based on input from [facility].  I spent 25 minutes counseling the patient face to face. The total time spent in the appointment was 25 minutes and more than 50% was on  counseling and direct patient cares.  Ethan Lone MD [place] Hematology/Oncology Physician [facility]  (Office):       [phone] (Work cell):  [phone] (Fax):           [phone]

## 2016-04-10 ENCOUNTER — Other Ambulatory Visit: Payer: Self-pay | Admitting: *Deleted

## 2016-04-10 DIAGNOSIS — C8330 Diffuse large B-cell lymphoma, unspecified site: Secondary | ICD-10-CM

## 2016-04-11 ENCOUNTER — Ambulatory Visit (HOSPITAL_BASED_OUTPATIENT_CLINIC_OR_DEPARTMENT_OTHER): Payer: PPO | Admitting: Hematology

## 2016-04-11 ENCOUNTER — Other Ambulatory Visit: Payer: Self-pay

## 2016-04-11 ENCOUNTER — Ambulatory Visit (HOSPITAL_COMMUNITY)
Admission: RE | Admit: 2016-04-11 | Discharge: 2016-04-11 | Disposition: A | Discharge status: Home / Self Care | Payer: PPO | Source: Ambulatory Visit | Attending: Hematology | Admitting: Hematology

## 2016-04-11 ENCOUNTER — Other Ambulatory Visit (HOSPITAL_BASED_OUTPATIENT_CLINIC_OR_DEPARTMENT_OTHER): Payer: PPO

## 2016-04-11 ENCOUNTER — Telehealth: Payer: Self-pay | Admitting: Hematology

## 2016-04-11 ENCOUNTER — Encounter: Payer: Self-pay | Admitting: Hematology

## 2016-04-11 ENCOUNTER — Ambulatory Visit (HOSPITAL_BASED_OUTPATIENT_CLINIC_OR_DEPARTMENT_OTHER): Payer: PPO

## 2016-04-11 VITALS — BP 130/68 | HR 79 | Temp 97.7°F | Resp 18 | Ht 73.0 in | Wt 225.4 lb

## 2016-04-11 DIAGNOSIS — C8338 Diffuse large B-cell lymphoma, lymph nodes of multiple sites: Secondary | ICD-10-CM | POA: Diagnosis not present

## 2016-04-11 DIAGNOSIS — G4700 Insomnia, unspecified: Secondary | ICD-10-CM | POA: Diagnosis not present

## 2016-04-11 DIAGNOSIS — M79671 Pain in right foot: Secondary | ICD-10-CM | POA: Insufficient documentation

## 2016-04-11 DIAGNOSIS — C8330 Diffuse large B-cell lymphoma, unspecified site: Secondary | ICD-10-CM

## 2016-04-11 DIAGNOSIS — X58XXXA Exposure to other specified factors, initial encounter: Secondary | ICD-10-CM | POA: Insufficient documentation

## 2016-04-11 DIAGNOSIS — Z95828 Presence of other vascular implants and grafts: Secondary | ICD-10-CM | POA: Insufficient documentation

## 2016-04-11 DIAGNOSIS — S92514A Nondisplaced fracture of proximal phalanx of right lesser toe(s), initial encounter for closed fracture: Secondary | ICD-10-CM | POA: Diagnosis not present

## 2016-04-11 DIAGNOSIS — F329 Major depressive disorder, single episode, unspecified: Secondary | ICD-10-CM

## 2016-04-11 DIAGNOSIS — D696 Thrombocytopenia, unspecified: Secondary | ICD-10-CM

## 2016-04-11 DIAGNOSIS — Z86718 Personal history of other venous thrombosis and embolism: Secondary | ICD-10-CM

## 2016-04-11 LAB — CBC & DIFF AND RETIC
BASO%: 0.7 % (ref 0.0–2.0)
BASOS ABS: 0 10*3/uL (ref 0.0–0.1)
EOS ABS: 0.1 10*3/uL (ref 0.0–0.5)
EOS%: 3 % (ref 0.0–7.0)
HEMATOCRIT: 37.7 % — AB (ref 38.4–49.9)
HGB: 13.1 g/dL (ref 13.0–17.1)
IMMATURE RETIC FRACT: 2.2 % — AB (ref 3.00–10.60)
LYMPH#: 1.1 10*3/uL (ref 0.9–3.3)
LYMPH%: 36 % (ref 14.0–49.0)
MCH: 30.5 pg (ref 27.2–33.4)
MCHC: 34.7 g/dL (ref 32.0–36.0)
MCV: 87.9 fL (ref 79.3–98.0)
MONO#: 0.4 10*3/uL (ref 0.1–0.9)
MONO%: 14.3 % — ABNORMAL HIGH (ref 0.0–14.0)
NEUT#: 1.4 10*3/uL — ABNORMAL LOW (ref 1.5–6.5)
NEUT%: 46 % (ref 39.0–75.0)
PLATELETS: 119 10*3/uL — AB (ref 140–400)
RBC: 4.29 10*6/uL (ref 4.20–5.82)
RDW: 15.2 % — ABNORMAL HIGH (ref 11.0–14.6)
RETIC CT ABS: 40.76 10*3/uL (ref 34.80–93.90)
Retic %: 0.95 % (ref 0.80–1.80)
WBC: 3 10*3/uL — ABNORMAL LOW (ref 4.0–10.3)

## 2016-04-11 LAB — COMPREHENSIVE METABOLIC PANEL
ALT: 27 U/L (ref 0–55)
ANION GAP: 7 meq/L (ref 3–11)
AST: 32 U/L (ref 5–34)
Albumin: 3.7 g/dL (ref 3.5–5.0)
Alkaline Phosphatase: 95 U/L (ref 40–150)
BILIRUBIN TOTAL: 0.4 mg/dL (ref 0.20–1.20)
BUN: 12.8 mg/dL (ref 7.0–26.0)
CALCIUM: 9.2 mg/dL (ref 8.4–10.4)
CO2: 25 meq/L (ref 22–29)
CREATININE: 0.9 mg/dL (ref 0.7–1.3)
Chloride: 111 mEq/L — ABNORMAL HIGH (ref 98–109)
EGFR: 85 mL/min/{1.73_m2} — ABNORMAL LOW (ref >90)
Glucose: 83 mg/dl (ref 70–140)
Potassium: 4.3 mEq/L (ref 3.5–5.1)
Sodium: 143 mEq/L (ref 136–145)
TOTAL PROTEIN: 6.2 g/dL — AB (ref 6.4–8.3)

## 2016-04-11 LAB — LACTATE DEHYDROGENASE: LDH: 183 U/L (ref 125–245)

## 2016-04-11 MED ORDER — MIRTAZAPINE 15 MG PO TABS: 15.0000 mg | ORAL_TABLET | Freq: Every day | ORAL | Status: DC

## 2016-04-11 MED ORDER — SODIUM CHLORIDE 0.9% FLUSH
10.0000 mL | INTRAVENOUS | Status: DC | PRN
  Administered 2016-04-11: 10 mL via INTRAVENOUS
  Filled 2016-04-11: qty 10

## 2016-04-11 MED ORDER — HEPARIN SOD (PORK) LOCK FLUSH 100 UNIT/ML IV SOLN
500.0000 [IU] | Freq: Once | INTRAVENOUS | Status: AC
  Administered 2016-04-11: 500 [IU] via INTRAVENOUS
  Filled 2016-04-11: qty 5

## 2016-04-11 NOTE — Patient Instructions (Signed)

## 2016-04-11 NOTE — Telephone Encounter (Signed)
per pof to sch pt appt-gave pt copy of avs-adv of XRAY

## 2016-04-14 NOTE — Progress Notes (Signed)
[name]  HEMATOLOGY ONCOLOGY PROGRESS NOTE  Date of service  .04/11/2016   Patient Care Team: Lona Kettle, MD as PCP - General (Family Medicine) Brunetta Genera, MD as Consulting Physician (Hematology)  CC: follow for diffuse large B-cell lymphoma  Diagnosis:   1) Diffuse large B-cell lymphoma Stage IVAE with extranodal involvement of C5 vertebra status post C5 corpectomy. 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 Waldman is here for his scheduled follow-up for diffuse large B-cell lymphoma. He notes no acute new symptoms. No fevers no chills no night sweats. His head is growing back. He had a CT-guided biopsy of one of his mesenteric lymph nodes at [facility] which had a limited amount of tissue that was negative for lymphoma. He has follow-up with Dr. Cassell Clement with a repeat PET/CT scan to determine any evidence of disease progression. She intended to get a laparoscopic excisional lymph node biopsy if the PET/CT scan showed progression of disease in the abdomen. Patient's LDH level is within normal limits today. He has no neck pain. His right upper extremity strength is nearly back to normal. No other acute new focal symptoms. Has been walking several miles a day.  REVIEW OF SYSTEMS:   10 point review of systems was done and is negative except as noted above  I have reviewed the past medical history, past surgical history, social history and family history with the patient and they are unchanged from previous note.  ALLERGIES:  is allergic to neulasta.  MEDICATIONS:  Current Outpatient Prescriptions  Medication Sig Dispense Refill  . aspirin EC 81 MG tablet Take 1  tablet (81 mg total) by mouth daily. 30 tablet 11  . mirtazapine (REMERON) 15 MG tablet Take 1 tablet (15 mg total) by mouth at bedtime. 30 tablet 3  . Multiple Vitamin (MULTIVITAMIN WITH MINERALS) TABS tablet Take 1 tablet by mouth daily. Reported on 02/19/2016    . simvastatin (ZOCOR) 20 MG tablet Take 20 mg by mouth daily.    . temazepam (RESTORIL) 15 MG capsule Take 15 mg by mouth at bedtime and may repeat dose one time if needed.     No current facility-administered medications for this visit.    PHYSICAL EXAMINATION: ECOG PERFORMANCE STATUS: 1 - Symptomatic but completely ambulatory  Filed Vitals:   04/11/16 0856  BP: 130/68  Pulse: 79  Temp: 97.7 F (36.5 C)  Resp: 18   Filed Weights   04/11/16 0856  Weight: 225 lb 6.4 oz (102.241 kg)    GENERAL:alert, no distress and comfortable SKIN: no acute rashes. Sacral decubitus ulcer not examined today -patient following wound care and notes that it is nearly completely healed. EYES: normal, Conjunctiva are pink and non-injected, sclera clear OROPHARYNX:no exudate, no erythema and lips, buccal mucosa, and tongue normal  NECK: supple, thyroid normal size, non-tender, without nodularity LYMPH:no palpable cervical, axillary or inguinal lymphadenopathy . LUNGS: clear to auscultation and percussion with normal breathing effort HEART: regular rate & rhythm and no murmurs and no lower extremity edema ABDOMEN:abdomen soft, non-tender and normal bowel sounds Musculoskeletal:no cyanosis of digits and no clubbing  NEURO: alert & oriented x 3 with fluent speech, 4+/5 weakness right upper extremity abduction-significantly improved  strength nearly back to baseline  LABORATORY DATA:   CBC Latest Ref Rng 04/11/2016 03/14/2016 01/15/2016  WBC 4.0 - 10.3 10e3/uL 3.0(L) 3.9(L) 2.6(L)  Hemoglobin 13.0 - 17.1 g/dL 13.1 12.1(L) 12.1(L)  Hematocrit 38.4 - 49.9 % 37.7(L) 35.6(L) 35.1(L)  Platelets 140 - 400 10e3/uL 119(L) 118(L) 131(L)   ANC 300  CMP  Latest Ref Rng 04/11/2016 03/14/2016 01/15/2016  Glucose 70 - 140 mg/dl 83 92 95  BUN 7.0 - 26.0 mg/dL 12.8 13.3 11.8  Creatinine 0.7 - 1.3 mg/dL 0.9 0.9 0.9  Sodium 136 - 145 mEq/L 143 142 143  Potassium 3.5 - 5.1 mEq/L 4.3 4.2 3.9  Chloride 101 - 111 mmol/L - - -  CO2 22 - 29 mEq/L '25 27 25  ' Calcium 8.4 - 10.4 mg/dL 9.2 9.1 9.4  Total Protein 6.4 - 8.3 g/dL 6.2(L) 6.1(L) 6.4  Total Bilirubin 0.20 - 1.20 mg/dL 0.40 0.43 0.39  Alkaline Phos 40 - 150 U/L 95 95 90  AST 5 - 34 U/L 32 29 39(H)  ALT 0 - 55 U/L 27 23 32   . Lab Results  Component Value Date   LDH 183 04/11/2016   ACCESSION NUMBER: [id number] RECEIVED: 03/29/2016 ORDERING PHYSICIAN: RAKHEE Megan Mans , MD PATIENT NAME: Docia Barrier CYTOLOGY REPORT  Final Cytologic Interpretation Central mesenteric lymph node (per procedure note), CT-guided fine needle aspiration II (smears and cell block): Rare clusters of atypical cells of uncertain type/significance (see comment).  Specimen Adequacy: Less than optimal-scant cellularity.  COMMENT:The aspirate smears exhibit very rare clusters of atypical cells of uncertain type in a background of blood. The cell block exhibits a few clusters of atypical epithelioid cells which may represent a laboratory contaminant ("floater"). Overall, definitive interpretation is precluded by scant cellularity. Recommend additional tissue sampling for definitive diagnosis, if clinically indicated.   I have personally reviewed the slides and/or other related materials referenced, and have edited the report as part of my pathologic assessment and final interpretation.  Electronically Signed Out By: Ronnald Ramp , MD   RADIOGRAPHIC STUDIES: I have personally reviewed the radiological images as listed and agreed with the findings in the report. No results found.    PET/CT 03/12/2016 CLINICAL DATA: Subsequent treatment strategy for diffuse large B-cell  lymphoma.  EXAM: NUCLEAR MEDICINE PET SKULL BASE TO THIGH  TECHNIQUE: 10.7 mCi F-18 FDG was injected intravenously. Full-ring PET imaging was performed from the skull base to thigh after the radiotracer. CT data was obtained and used for attenuation correction and anatomic localization.  FASTING BLOOD GLUCOSE: Value: 72 mg/dl  COMPARISON: PET-CT dated 11/28/2015  FINDINGS: NECK  No hypermetabolic lymph nodes in the neck.  CHEST  1.1 x 2.2 mm nodule in the posterior right lung base (series 6/ image 55), max SUV 3.3, previously 1.2 x 2.9 cm with max SUV 4.2.  Two additional right lower lobe nodules measure 3-4 mm (series 6/image 29) and 5 mm (series 6/image 49), grossly unchanged.  Mild dependent atelectasis in the left lower lobe. The heart is top-normal in size. No pericardial effusion. No hypermetabolic thoracic lymphadenopathy. Right chest port terminates at the cavoatrial junction.  ABDOMEN/PELVIS  No abnormal hypermetabolic activity within the liver, pancreas, adrenal glands, or spleen.  Mild pneumobilia. Atherosclerotic calcifications the abdominal aorta. Prostatomegaly, with enlargement of the central gland which indents the base the bladder. Postsurgical changes related to bilateral inguinal hernia repair.  Progression of small lymph nodes along the jejunal mesentery in the left mid abdomen. Index 9 mm  short axis node in the left mid abdomen with max SUV 9.0, previously 10 mm with max SUV 12.7. However, there are additional scattered new lymph nodes measuring 7-8 mm short axis (for example, series 4/images 124, 127, 134, 151, and 152), max SUV 8.2.  SKELETON  No focal hypermetabolic activity to suggest skeletal metastasis.  IMPRESSION: Progression of small hypermetabolic lymph nodes along the left jejunal mesentery.  1.1 x 2.2 cm nodule in the posterior right lung base, suspicious for pulmonary lymphoma,  decreased.   Electronically Signed  By: Julian Hy M.D.  On: 03/12/2016 09:03   ASSESSMENT & PLAN:   1) Diffuse large B-cell lymphoma stage IV AE with involvement of mesenteric, inguinal lymph nodes and C5 vertebral body with spinal cord impingement.  He had a C5 corpectomy on 07/26/2015 that showed diffuse large B-cell lymphoma with a Ki-67 ranging from 20 to 90%. Cytogenetics and Fish showed BCL 2 positivity but negative for BCL 6 and cMYC CD10 positivity suggestive of possibly GCB subtype.  Patient received first cycle of R CHOP on 08/08/2015 and intrathecal methotrexate with hydrocortisone on 08/09/2015. Patient subsequently was admitted with ARDS likely due to neulasta -treated with high dose steroids and empirically with broad spectrum antibiotics.  2nd cycle of chemotherapy delayed to allow for rehabilitation (was due on 08/31/2015).  Received R-mini-CHOP for cycle 2 and tolerated it without significant cytopenias. Was admitted briefly for a mild lower extremity cellulitis and DVT. Cellulitis now resolved. Patient completing his oral antibiotics.  PET/CT scan on 09/29/2015 with no evidence of disease progression. Good response in his mesenteric lymph nodes. Inguinal lymph nodes on the right side. Decreased uptake in his C5 level.  No new lesions noted.   Cycle 3 (10/02/2015)  of R- CHOP ( we increased the doxorubicin dose back to 50 mg/m and keep the cyclophosphamide dose reduction at 400 mg/m]  Cycle 4 (10/23/2015) of R-CHOP ( we increased the doxorubicin dose back to 50 mg/m and kept the cyclophosphamide dose reduction at 400 mg/m].  PET/CT 11/10/2015: Shows improvement in most lesions but is noted to have a new FDG avid lymph node in the mesentery of the small bowel.  Cycle 5 on 11/13/2015 R CHOP - received full dose.  Cycle 6 on 12/11/2015 R-CHOP full dose.  Admitted with neutropenic fevers and treatment for possible early pneumonia.  This resolved and he was able  to discharge home  01/09/2016 through 01/22/2016: The patient was treated to the C4-C6 levels of the spine to a dose of 30 gray in 10 fractions using a 2 field technique. The patient was also treated to an abdominal lymph node region to a dose of 30 gray in 10 fractions using a 3 field technique. This treatment consisted of a 3-D conformal technique with daily image guidance utilized.   Repeat PET CT scan in March 12 2016 -showed some enlarged mesenteric lymph nodes which were FDG avid.  CT guided FNA of mesenteric lymph nodes at [facility] on 03/29/2016 - showed scant cellularity. No overt evidence of lymphoma.  Plan -patient notes that he is feeling very well and has no clinical evidence of lymphoma recurrence/progression at this time.  His LDH levels are within normal limits.  No constitutional symptoms. -He will be getting a repeat PET CT scan in May 2017 at [facility] with Dr Cassell Clement. -If that he is progression of mesenteric lymphadenopathy and no other concerning disease she is intending to consider a laparoscopic excisional lymph node biopsy. -I shall  see him back in 2 months after he has had that evaluation.  2)Acid reflux and gastritis likely related to steroid use -controlled -Continue PPI  3) Decubitus Ulcer over sacral area - completely resolved 4) Insomnia/Depression - much improved. Continue on Remeron. 5) Significant muscle loss due to steroids/hospitalization/debility- has improved back to baseline now with improved physical activity currently is walking several miles a day.  6)right lower extremity distal DVT. Resolved. Completed 6 months of treatment in 02/2016  10) Mild thrombocytopenia- monitoring.  -Return to care with Dr. Irene Limbo in 2 months with CBC, CMP, LDH. We will adjust followup plan based on input from [facility].  I spent 25 minutes counseling the patient face to face. The total time spent in the appointment was 25 minutes and more than 50%  was on counseling and direct patient cares.  Sullivan Lone MD [place] Hematology/Oncology Physician [facility]  (Office):       [phone] (Work cell):  [phone] (Fax):           [phone]

## 2016-06-03 ENCOUNTER — Other Ambulatory Visit: Payer: Self-pay | Admitting: *Deleted

## 2016-06-03 ENCOUNTER — Telehealth: Payer: Self-pay | Admitting: Hematology

## 2016-06-03 NOTE — Telephone Encounter (Signed)
spoke w/ pt confirmed 6/27 apts

## 2016-06-11 ENCOUNTER — Other Ambulatory Visit: Payer: Self-pay

## 2016-06-11 ENCOUNTER — Ambulatory Visit: Payer: Self-pay | Admitting: Hematology

## 2016-06-11 ENCOUNTER — Telehealth: Payer: Self-pay | Admitting: Hematology

## 2016-06-11 NOTE — Telephone Encounter (Signed)
pt cld to r/s appt to 6/26-gave pt r/s appt for 6/26@2 :45 r/s from 6/27

## 2016-06-17 ENCOUNTER — Other Ambulatory Visit (HOSPITAL_BASED_OUTPATIENT_CLINIC_OR_DEPARTMENT_OTHER): Payer: PPO

## 2016-06-17 ENCOUNTER — Encounter: Payer: Self-pay | Admitting: Hematology

## 2016-06-17 ENCOUNTER — Ambulatory Visit (HOSPITAL_BASED_OUTPATIENT_CLINIC_OR_DEPARTMENT_OTHER): Payer: PPO | Admitting: Hematology

## 2016-06-17 ENCOUNTER — Telehealth: Payer: Self-pay | Admitting: Hematology

## 2016-06-17 ENCOUNTER — Ambulatory Visit (HOSPITAL_BASED_OUTPATIENT_CLINIC_OR_DEPARTMENT_OTHER): Payer: PPO

## 2016-06-17 VITALS — BP 138/77 | HR 67 | Temp 97.7°F | Resp 18 | Ht 73.0 in | Wt 224.0 lb

## 2016-06-17 DIAGNOSIS — C8338 Diffuse large B-cell lymphoma, lymph nodes of multiple sites: Secondary | ICD-10-CM

## 2016-06-17 DIAGNOSIS — D696 Thrombocytopenia, unspecified: Secondary | ICD-10-CM | POA: Diagnosis not present

## 2016-06-17 DIAGNOSIS — K2970 Gastritis, unspecified, without bleeding: Secondary | ICD-10-CM | POA: Diagnosis not present

## 2016-06-17 DIAGNOSIS — Z95828 Presence of other vascular implants and grafts: Secondary | ICD-10-CM

## 2016-06-17 DIAGNOSIS — C8330 Diffuse large B-cell lymphoma, unspecified site: Secondary | ICD-10-CM

## 2016-06-17 DIAGNOSIS — K219 Gastro-esophageal reflux disease without esophagitis: Secondary | ICD-10-CM | POA: Diagnosis not present

## 2016-06-17 LAB — COMPREHENSIVE METABOLIC PANEL
ALT: 29 U/L (ref 0–55)
AST: 30 U/L (ref 5–34)
Albumin: 3.9 g/dL (ref 3.5–5.0)
Alkaline Phosphatase: 115 U/L (ref 40–150)
Anion Gap: 6 mEq/L (ref 3–11)
BUN: 15.6 mg/dL (ref 7.0–26.0)
CALCIUM: 9 mg/dL (ref 8.4–10.4)
CHLORIDE: 109 meq/L (ref 98–109)
CO2: 26 mEq/L (ref 22–29)
Creatinine: 0.9 mg/dL (ref 0.7–1.3)
EGFR: 83 mL/min/{1.73_m2} — AB (ref >90)
Glucose: 88 mg/dl (ref 70–140)
POTASSIUM: 4.2 meq/L (ref 3.5–5.1)
SODIUM: 142 meq/L (ref 136–145)
Total Bilirubin: 0.55 mg/dL (ref 0.20–1.20)
Total Protein: 6.3 g/dL — ABNORMAL LOW (ref 6.4–8.3)

## 2016-06-17 LAB — CBC & DIFF AND RETIC
BASO%: 0.3 % (ref 0.0–2.0)
Basophils Absolute: 0 10*3/uL (ref 0.0–0.1)
EOS%: 2 % (ref 0.0–7.0)
Eosinophils Absolute: 0.1 10*3/uL (ref 0.0–0.5)
HEMATOCRIT: 37.7 % — AB (ref 38.4–49.9)
HGB: 13.2 g/dL (ref 13.0–17.1)
Immature Retic Fract: 3.2 % (ref 3.00–10.60)
LYMPH%: 35 % (ref 14.0–49.0)
MCH: 30.9 pg (ref 27.2–33.4)
MCHC: 35 g/dL (ref 32.0–36.0)
MCV: 88.3 fL (ref 79.3–98.0)
MONO#: 0.4 10*3/uL (ref 0.1–0.9)
MONO%: 12.1 % (ref 0.0–14.0)
NEUT%: 50.6 % (ref 39.0–75.0)
NEUTROS ABS: 1.8 10*3/uL (ref 1.5–6.5)
Platelets: 135 10*3/uL — ABNORMAL LOW (ref 140–400)
RBC: 4.27 10*6/uL (ref 4.20–5.82)
RDW: 13.8 % (ref 11.0–14.6)
RETIC %: 0.76 % — AB (ref 0.80–1.80)
Retic Ct Abs: 32.45 10*3/uL — ABNORMAL LOW (ref 34.80–93.90)
WBC: 3.5 10*3/uL — AB (ref 4.0–10.3)
lymph#: 1.2 10*3/uL (ref 0.9–3.3)

## 2016-06-17 LAB — LACTATE DEHYDROGENASE: LDH: 216 U/L (ref 125–245)

## 2016-06-17 MED ORDER — HEPARIN SOD (PORK) LOCK FLUSH 100 UNIT/ML IV SOLN
500.0000 [IU] | Freq: Once | INTRAVENOUS | Status: AC | PRN
  Administered 2016-06-17: 500 [IU] via INTRAVENOUS
  Filled 2016-06-17: qty 5

## 2016-06-17 MED ORDER — SODIUM CHLORIDE 0.9 % IJ SOLN
10.0000 mL | INTRAMUSCULAR | Status: DC | PRN
  Administered 2016-06-17: 10 mL via INTRAVENOUS
  Filled 2016-06-17: qty 10

## 2016-06-17 NOTE — Telephone Encounter (Signed)
Pt requested week of 9/11... Apt sched for sept 13... Gave pt cal & avs

## 2016-06-17 NOTE — Patient Instructions (Signed)

## 2016-06-17 NOTE — Progress Notes (Signed)
[name] Kitchen  HEMATOLOGY ONCOLOGY PROGRESS NOTE  Date of service: 06/17/2016    Patient Care Team: Lona Kettle, MD as PCP - General (Family Medicine) Brunetta Genera, MD as Consulting Physician (Hematology)  CC: follow for diffuse large B-cell lymphoma  Diagnosis:   1) Diffuse large B-cell lymphoma Stage IVAE with extranodal involvement of C5 vertebra status post C5 corpectomy. 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 He notes no acute new symptoms. No fevers no chills no night sweats no weight loss. His hair has regrown back. No significant new enlarged lymph nodes noted. He had a follow-up PET/CT scan at [facility] on 05/15/2016 which showed stable hypermetabolic mesenteric lymph nodes with no change. No other areas of disease progression noted. There was a plan for him to get a repeat PET CT scan in August 2017 to follow up on these lesions. Patient has been eating well and has gained back all the weight that he had lost. He has been walking several miles a day and going to the [facility] 3 times a week.  REVIEW OF SYSTEMS:   10 point review of systems was done and is negative except as noted above  I have reviewed the past medical history, past surgical history, social history and family history with the patient and they are unchanged from previous note.  ALLERGIES:  is allergic to neulasta.  MEDICATIONS:  Current Outpatient Prescriptions  Medication Sig Dispense Refill  . aspirin EC 81 MG tablet Take 1 tablet (81 mg total) by mouth daily. 30 tablet 11  . mirtazapine (REMERON) 15  MG tablet Take 1 tablet (15 mg total) by mouth at bedtime. 30 tablet 3  . Multiple Vitamin (MULTIVITAMIN WITH MINERALS) TABS tablet Take 1 tablet by mouth daily. Reported on 02/19/2016    . simvastatin (ZOCOR) 20 MG tablet Take 20 mg by mouth daily.    . temazepam (RESTORIL) 15 MG capsule Take 15 mg by mouth at bedtime and may repeat dose one time if needed.     No current facility-administered medications for this visit.   Facility-Administered Medications Ordered in Other Visits  Medication Dose Route Frequency Provider Last Rate Last Dose  . sodium chloride 0.9 % injection 10 mL  10 mL Intravenous PRN Brunetta Genera, MD   10 mL at 06/17/16 1519    PHYSICAL EXAMINATION: ECOG PERFORMANCE STATUS: 1 - Symptomatic but completely ambulatory  Filed Vitals:   06/17/16 1540  BP: 138/77  Pulse: 67  Temp: 97.7 F (36.5 C)  Resp: 18   Filed Weights   06/17/16 1540  Weight: 224 lb (101.606 kg)    GENERAL:alert, no distress and comfortable SKIN: no acute rashes. Sacral decubitus ulcer Resolved . Small warty-looking lesion on the right neck. EYES: normal, Conjunctiva are pink and non-injected, sclera clear OROPHARYNX:no exudate, no erythema and lips, buccal mucosa, and tongue normal  NECK: supple, thyroid normal size, non-tender, without nodularity LYMPH:no palpable cervical, axillary or inguinal lymphadenopathy . LUNGS: clear to auscultation and percussion with normal breathing effort HEART: regular rate & rhythm and no murmurs and no lower extremity edema ABDOMEN:abdomen soft, non-tender and  normal bowel sounds Musculoskeletal:no cyanosis of digits and no clubbing  NEURO: alert & oriented x 3 with fluent speech, 4+/5 weakness right upper extremity abduction-significantly improved strength nearly back to baseline  LABORATORY DATA:   CBC Latest Ref Rng 06/17/2016 04/11/2016 03/14/2016  WBC 4.0 - 10.3 10e3/uL 3.5(L) 3.0(L) 3.9(L)  Hemoglobin 13.0 - 17.1 g/dL 13.2 13.1 12.1(L)    Hematocrit 38.4 - 49.9 % 37.7(L) 37.7(L) 35.6(L)  Platelets 140 - 400 10e3/uL 135(L) 119(L) 118(L)   ANC 300  CMP Latest Ref Rng 06/17/2016 04/11/2016 03/14/2016  Glucose 70 - 140 mg/dl 88 83 92  BUN 7.0 - 26.0 mg/dL 15.6 12.8 13.3  Creatinine 0.7 - 1.3 mg/dL 0.9 0.9 0.9  Sodium 136 - 145 mEq/L 142 143 142  Potassium 3.5 - 5.1 mEq/L 4.2 4.3 4.2  CO2 22 - 29 mEq/L '26 25 27  ' Calcium 8.4 - 10.4 mg/dL 9.0 9.2 9.1  Total Protein 6.4 - 8.3 g/dL 6.3(L) 6.2(L) 6.1(L)  Total Bilirubin 0.20 - 1.20 mg/dL 0.55 0.40 0.43  Alkaline Phos 40 - 150 U/L 115 95 95  AST 5 - 34 U/L 30 32 29  ALT 0 - 55 U/L '29 27 23   ' . Lab Results  Component Value Date   LDH 183 04/11/2016   ACCESSION NUMBER: [id number] RECEIVED: 03/29/2016 ORDERING PHYSICIAN: RAKHEE Megan Mans , MD PATIENT NAME: Ethan Stewart CYTOLOGY REPORT  Final Cytologic Interpretation Central mesenteric lymph node (per procedure note), CT-guided fine needle aspiration II (smears and cell block): Rare clusters of atypical cells of uncertain type/significance (see comment).  Specimen Adequacy: Less than optimal-scant cellularity.  COMMENT:The aspirate smears exhibit very rare clusters of atypical cells of uncertain type in a background of blood. The cell block exhibits a few clusters of atypical epithelioid cells which may represent a laboratory contaminant ("floater"). Overall, definitive interpretation is precluded by scant cellularity. Recommend additional tissue sampling for definitive diagnosis, if clinically indicated.   I have personally reviewed the slides and/or other related materials referenced, and have edited the report as part of my pathologic assessment and final interpretation.  Electronically Signed Out By: Ronnald Ramp , MD   RADIOGRAPHIC STUDIES: I have personally reviewed the radiological images as listed and agreed with the findings in the report. No results found.    PET/CT  03/12/2016 CLINICAL DATA: Subsequent treatment strategy for diffuse large B-cell lymphoma.  EXAM: NUCLEAR MEDICINE PET SKULL BASE TO THIGH  TECHNIQUE: 10.7 mCi F-18 FDG was injected intravenously. Full-ring PET imaging was performed from the skull base to thigh after the radiotracer. CT data was obtained and used for attenuation correction and anatomic localization.  FASTING BLOOD GLUCOSE: Value: 72 mg/dl  COMPARISON: PET-CT dated 11/28/2015  FINDINGS: NECK  No hypermetabolic lymph nodes in the neck.  CHEST  1.1 x 2.2 mm nodule in the posterior right lung base (series 6/ image 55), max SUV 3.3, previously 1.2 x 2.9 cm with max SUV 4.2.  Two additional right lower lobe nodules measure 3-4 mm (series 6/image 29) and 5 mm (series 6/image 49), grossly unchanged.  Mild dependent atelectasis in the left lower lobe. The heart is top-normal in size. No pericardial effusion. No hypermetabolic thoracic lymphadenopathy. Right chest port terminates at the cavoatrial junction.  ABDOMEN/PELVIS  No abnormal hypermetabolic activity within the liver, pancreas, adrenal glands, or spleen.  Mild pneumobilia. Atherosclerotic calcifications the abdominal aorta. Prostatomegaly, with enlargement of the central gland which indents the base the bladder. Postsurgical changes related to bilateral inguinal hernia  repair.  Progression of small lymph nodes along the jejunal mesentery in the left mid abdomen. Index 9 mm short axis node in the left mid abdomen with max SUV 9.0, previously 10 mm with max SUV 12.7. However, there are additional scattered new lymph nodes measuring 7-8 mm short axis (for example, series 4/images 124, 127, 134, 151, and 152), max SUV 8.2.  SKELETON  No focal hypermetabolic activity to suggest skeletal metastasis.  IMPRESSION: Progression of small hypermetabolic lymph nodes along the left jejunal mesentery.  1.1 x 2.2 cm nodule in the  posterior right lung base, suspicious for pulmonary lymphoma, decreased.   Electronically Signed  By: Julian Hy M.D.  On: 03/12/2016 09:03   PET LYMPHOMA NON HODGKINS (W/LOW DOSE CT)05/15/2016  [facility]  Result Impression   1.  Persistent hypermetabolic activity in multiple mesenteric lymph nodes. 2.  Small consolidative opacity in the right lower lobe no longer exhibits hypermetabolic activity and is favored to reflect resolving infection. 3.  Mild hypermetabolic activity in the soft tissues surrounding the coccyx is similar to prior and nonspecific. Clinical correlation is recommended. 4.  Focus of hypermetabolic activity in the soft tissues adjacent to the right first rib is favored to be physiologic, however attention on follow-up is recommended. 5.  Ancillary CT findings as above.    ASSESSMENT & PLAN:   1) Diffuse large B-cell lymphoma stage IV AE with involvement of mesenteric, inguinal lymph nodes and C5 vertebral body with spinal cord impingement.  He had a C5 corpectomy on 07/26/2015 that showed diffuse large B-cell lymphoma with a Ki-67 ranging from 20 to 90%. Cytogenetics and Fish showed BCL 2 positivity but negative for BCL 6 and cMYC CD10 positivity suggestive of possibly GCB subtype.  Patient received first cycle of R CHOP on 08/08/2015 and intrathecal methotrexate with hydrocortisone on 08/09/2015. Patient subsequently was admitted with ARDS likely due to neulasta -treated with high dose steroids and empirically with broad spectrum antibiotics.  2nd cycle of chemotherapy delayed to allow for rehabilitation (was due on 08/31/2015).  Received R-mini-CHOP for cycle 2 and tolerated it without significant cytopenias. Was admitted briefly for a mild lower extremity cellulitis and DVT. Cellulitis now resolved. Patient completing his oral antibiotics.  PET/CT scan on 09/29/2015 with no evidence of disease progression. Good response in his  mesenteric lymph nodes. Inguinal lymph nodes on the right side. Decreased uptake in his C5 level.  No new lesions noted.   Cycle 3 (10/02/2015)  of R- CHOP ( we increased the doxorubicin dose back to 50 mg/m and keep the cyclophosphamide dose reduction at 400 mg/m]  Cycle 4 (10/23/2015) of R-CHOP ( we increased the doxorubicin dose back to 50 mg/m and kept the cyclophosphamide dose reduction at 400 mg/m].  PET/CT 11/10/2015: Shows improvement in most lesions but is noted to have a new FDG avid lymph node in the mesentery of the small bowel.  Cycle 5 on 11/13/2015 R CHOP - received full dose.  Cycle 6 on 12/11/2015 R-CHOP full dose.  Admitted with neutropenic fevers and treatment for possible early pneumonia.  This resolved and he was able to discharge home  01/09/2016 through 01/22/2016: The patient was treated to the C4-C6 levels of the spine to a dose of 30 gray in 10 fractions using a 2 field technique. The patient was also treated to an abdominal lymph node region to a dose of 30 gray in 10 fractions using a 3 field technique. This treatment consisted of a 3-D  conformal technique with daily image guidance utilized.   Repeat PET CT scan in March 12 2016 -showed some enlarged mesenteric lymph nodes which were FDG avid.  CT guided FNA of mesenteric lymph nodes at [facility] on 03/29/2016 - showed scant cellularity. No overt evidence of lymphoma.  Rpt PET/CT 05/15/2016 at [facility]- show similar appearing enlarged mesenteric lymph nodes which are FDG avid. No change in size and no other evidence of disease progression.   Plan -patient notes that he is feeling very well and has no clinical evidence of lymphoma recurrence/progression at this time.   -His LDH levels are pending from today - No constitutional symptoms. -He will be getting a repeat PET CT scan in aug 2017 at [facility] with Dr Cassell Clement. -If there is progression of mesenteric lymphadenopathy and no  other concerning disease she is intending to consider a laparoscopic excisional lymph node biopsy. Based on the recent PET CT scan this is not necessary. -He was recommended to continue his active lifestyle and good nutrition.  -I shall see him back in 3 months with repeat labs  2)Acid reflux and gastritis likely related to steroid use -controlled -Continue PPI  3) Decubitus Ulcer over sacral area - completely resolved 4) Insomnia/Depression - resolved. Patient stopped using his Remeron due to constipation. 5) Significant muscle loss due to steroids/hospitalization/debility- has improved back to baseline now with improved physical activity currently is walking several miles a day.  6)right lower extremity distal DVT. Resolved. Completed 6 months of treatment in 02/2016  7) Mild thrombocytopenia- platelets 135 k. monitoring.  -Return to care with Dr. Irene Limbo in 3 months with CBC, CMP, LDH. And PET/CT (planned at [facility]). I spent 25 minutes counseling the patient face to face. The total time spent in the appointment was 25 minutes and more than 50% was on counseling and direct patient cares.  Sullivan Lone MD [place] [facility]  (Office):       [phone] (Work cell):  [phone] (Fax):           [phone]

## 2016-06-17 NOTE — Progress Notes (Signed)
Pt has red, raised, bumps on area where PAC was scrubbed with chlorhexadine.  Pt denies allergy to chlorhexdine.  Denies any burning or itching.  Area washed with warm, soapy water.  Pt has appt with Dr Irene Limbo this afternoon.  Dr Grier Mitts nurse aware.

## 2016-06-18 ENCOUNTER — Other Ambulatory Visit: Payer: Self-pay

## 2016-06-18 ENCOUNTER — Ambulatory Visit: Payer: Self-pay | Admitting: Hematology

## 2016-06-18 ENCOUNTER — Telehealth: Payer: Self-pay | Admitting: Hematology

## 2016-06-18 NOTE — Telephone Encounter (Signed)
Faxed pt record to [facility] [phone]

## 2016-06-27 ENCOUNTER — Telehealth: Payer: Self-pay | Admitting: Hematology

## 2016-06-27 ENCOUNTER — Other Ambulatory Visit: Payer: Self-pay | Admitting: Hematology

## 2016-06-27 DIAGNOSIS — L989 Disorder of the skin and subcutaneous tissue, unspecified: Secondary | ICD-10-CM

## 2016-06-27 NOTE — Telephone Encounter (Signed)
Faxed pt records to [facility]

## 2016-06-27 NOTE — Telephone Encounter (Signed)
called both phone #s and left msg confirming [place] [facility] apt for 7/7

## 2016-06-27 NOTE — Telephone Encounter (Signed)
Waiting on [facility] for apt,

## 2016-06-27 NOTE — Telephone Encounter (Signed)
Call received from Denice Paradise with [facility] with appointment for th is patient.  "Tomorrow 06-28-2016 at 11:40 arrive early to complete new patient forms.  Bring ID and insurance card.  Fax records to [phone].  Called patient with this information, providing office address and phone number.  Called [facility] for records to be sent and they have already started this process.

## 2016-09-04 ENCOUNTER — Other Ambulatory Visit: Payer: Self-pay

## 2016-09-04 ENCOUNTER — Ambulatory Visit: Payer: Self-pay | Admitting: Hematology

## 2016-09-05 ENCOUNTER — Other Ambulatory Visit (HOSPITAL_BASED_OUTPATIENT_CLINIC_OR_DEPARTMENT_OTHER): Payer: PPO

## 2016-09-05 ENCOUNTER — Ambulatory Visit (HOSPITAL_BASED_OUTPATIENT_CLINIC_OR_DEPARTMENT_OTHER): Payer: PPO

## 2016-09-05 ENCOUNTER — Encounter: Payer: Self-pay | Admitting: Hematology

## 2016-09-05 ENCOUNTER — Telehealth: Payer: Self-pay | Admitting: Hematology

## 2016-09-05 ENCOUNTER — Ambulatory Visit (HOSPITAL_BASED_OUTPATIENT_CLINIC_OR_DEPARTMENT_OTHER): Payer: PPO | Admitting: Hematology

## 2016-09-05 VITALS — BP 124/64 | HR 61 | Temp 97.7°F | Resp 17 | Ht 73.0 in | Wt 219.6 lb

## 2016-09-05 DIAGNOSIS — Z95828 Presence of other vascular implants and grafts: Secondary | ICD-10-CM

## 2016-09-05 DIAGNOSIS — C8330 Diffuse large B-cell lymphoma, unspecified site: Secondary | ICD-10-CM

## 2016-09-05 DIAGNOSIS — K219 Gastro-esophageal reflux disease without esophagitis: Secondary | ICD-10-CM

## 2016-09-05 DIAGNOSIS — C8338 Diffuse large B-cell lymphoma, lymph nodes of multiple sites: Secondary | ICD-10-CM

## 2016-09-05 DIAGNOSIS — D696 Thrombocytopenia, unspecified: Secondary | ICD-10-CM | POA: Diagnosis not present

## 2016-09-05 DIAGNOSIS — K2970 Gastritis, unspecified, without bleeding: Secondary | ICD-10-CM

## 2016-09-05 LAB — CBC & DIFF AND RETIC
BASO%: 0.3 % (ref 0.0–2.0)
BASOS ABS: 0 10*3/uL (ref 0.0–0.1)
EOS ABS: 0.1 10*3/uL (ref 0.0–0.5)
EOS%: 1.7 % (ref 0.0–7.0)
HEMATOCRIT: 37.5 % — AB (ref 38.4–49.9)
HEMOGLOBIN: 13.3 g/dL (ref 13.0–17.1)
IMMATURE RETIC FRACT: 2.6 % — AB (ref 3.00–10.60)
LYMPH%: 33.4 % (ref 14.0–49.0)
MCH: 31.3 pg (ref 27.2–33.4)
MCHC: 35.5 g/dL (ref 32.0–36.0)
MCV: 88.2 fL (ref 79.3–98.0)
MONO#: 0.4 10*3/uL (ref 0.1–0.9)
MONO%: 11.5 % (ref 0.0–14.0)
NEUT#: 1.9 10*3/uL (ref 1.5–6.5)
NEUT%: 53.1 % (ref 39.0–75.0)
Platelets: 114 10*3/uL — ABNORMAL LOW (ref 140–400)
RBC: 4.25 10*6/uL (ref 4.20–5.82)
RDW: 13.7 % (ref 11.0–14.6)
RETIC %: 0.83 % (ref 0.80–1.80)
RETIC CT ABS: 35.28 10*3/uL (ref 34.80–93.90)
WBC: 3.6 10*3/uL — ABNORMAL LOW (ref 4.0–10.3)
lymph#: 1.2 10*3/uL (ref 0.9–3.3)

## 2016-09-05 LAB — COMPREHENSIVE METABOLIC PANEL
ALK PHOS: 112 U/L (ref 40–150)
ALT: 23 U/L (ref 0–55)
AST: 26 U/L (ref 5–34)
Albumin: 3.8 g/dL (ref 3.5–5.0)
Anion Gap: 6 mEq/L (ref 3–11)
BUN: 17.3 mg/dL (ref 7.0–26.0)
CHLORIDE: 109 meq/L (ref 98–109)
CO2: 27 mEq/L (ref 22–29)
Calcium: 9 mg/dL (ref 8.4–10.4)
Creatinine: 0.9 mg/dL (ref 0.7–1.3)
EGFR: 82 mL/min/{1.73_m2} — AB (ref >90)
GLUCOSE: 88 mg/dL (ref 70–140)
POTASSIUM: 4.1 meq/L (ref 3.5–5.1)
SODIUM: 142 meq/L (ref 136–145)
Total Bilirubin: 0.61 mg/dL (ref 0.20–1.20)
Total Protein: 6.4 g/dL (ref 6.4–8.3)

## 2016-09-05 LAB — LACTATE DEHYDROGENASE: LDH: 190 U/L (ref 125–245)

## 2016-09-05 MED ORDER — HEPARIN SOD (PORK) LOCK FLUSH 100 UNIT/ML IV SOLN
500.0000 [IU] | Freq: Once | INTRAVENOUS | Status: AC | PRN
  Administered 2016-09-05: 500 [IU] via INTRAVENOUS
  Filled 2016-09-05: qty 5

## 2016-09-05 MED ORDER — SODIUM CHLORIDE 0.9 % IJ SOLN
10.0000 mL | INTRAMUSCULAR | Status: DC | PRN
  Administered 2016-09-05: 10 mL via INTRAVENOUS
  Filled 2016-09-05: qty 10

## 2016-09-05 NOTE — Patient Instructions (Signed)

## 2016-09-05 NOTE — Telephone Encounter (Signed)
Gave patient avs report and appointments for October and December  °

## 2016-09-08 NOTE — Progress Notes (Signed)
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 Cytogenetics and Fish showed BCL 2 positivity but negative for BCL 6 and cMYC CD10 positivity suggestive of possibly GCB subtype.  Patient received first cycle of R CHOP on 08/08/2015 and intrathecal methotrexate with hydrocortisone on 08/09/2015. Patient subsequently was admitted with ARDS likely due to neulasta -treated with high dose steroids and empirically with broad spectrum antibiotics.  2nd cycle of chemotherapy delayed to allow for rehabilitation (was due on 08/31/2015).  Received R-mini-CHOP for cycle 2 and tolerated it without significant cytopenias. Was admitted briefly for a mild lower extremity cellulitis and DVT. Cellulitis now resolved. Patient completing his oral antibiotics.  PET/CT scan on 09/29/2015 with no evidence of disease progression. Good response in his mesenteric lymph nodes. Inguinal lymph nodes on the right side.  Decreased uptake in his C5 level.  No new lesions noted.   Cycle 3 (10/02/2015)  of R- CHOP ( we increased the doxorubicin dose back to 50 mg/m and keep the cyclophosphamide dose reduction at 400 mg/m]  Cycle 4 (10/23/2015) of R-CHOP ( we increased the doxorubicin dose back to 50 mg/m and kept the cyclophosphamide dose reduction at 400 mg/m].  PET/CT 11/10/2015: Shows improvement in most lesions but is noted to have a new FDG avid lymph node in the mesentery of the small bowel.  Cycle 5 on 11/13/2015 R CHOP - received full dose.  Cycle 6 on 12/11/2015 R-CHOP full dose.  Admitted with neutropenic fevers and treatment for possible early pneumonia.  This resolved and he was able to discharge home  01/09/2016 through 01/22/2016: The patient was treated to the C4-C6 levels of the spine to a dose of 30 gray in 10 fractions using a 2 field technique. The patient was also treated to an abdominal lymph node region to a dose of 30 gray in 10 fractions using a 3 field technique. This treatment consisted of a 3-D conformal technique with daily image guidance utilized.   Repeat PET CT scan in March 12 2016 -showed some enlarged mesenteric lymph nodes which were FDG avid.  CT guided FNA of mesenteric lymph nodes at [facility] on 03/29/2016 - showed scant cellularity. No overt evidence of lymphoma.  Rpt PET/CT 05/15/2016 at [facility]- show similar appearing enlarged mesenteric lymph nodes which are FDG avid. No change in size and no other evidence of disease progression.  PET/CT 07/24/2016 - and [facility] shows no evidence of disease progression  INTERVAL HISTORY:  [name] is here for  his scheduled 3 month follow-up for diffuse large B-cell lymphoma. He notes no acute new symptoms. No fevers no chills no night sweats no weight loss.  He notes that he is doing well as playing golf and trying to stay physically fit and active. He recently had a PET/CT scan at  [facility] on 07/24/2016 which showed similar size and hypermetabolic activity in multiple mesenteric lymph nodes with no evidence of new hypermetabolic lymphadenopathy. [name] his wife was present with him for this visit.  REVIEW OF SYSTEMS:   10 point review of systems was done and is negative except as noted above  I have reviewed the past medical history, past surgical history, social history and family history with the patient and they are unchanged from previous note.  ALLERGIES:  is allergic to neulasta [pegfilgrastim].  MEDICATIONS:  Current Outpatient Prescriptions  Medication Sig Dispense Refill  . aspirin EC 81 MG tablet Take 1 tablet (81 mg total) by mouth daily. 30 tablet 11  . Multiple Vitamin (MULTIVITAMIN WITH MINERALS) TABS tablet Take 1 tablet by mouth daily. Reported on 02/19/2016    . SENNA CO by Combination route.    . simvastatin (ZOCOR) 20 MG tablet Take 20 mg by mouth daily.    . temazepam (RESTORIL) 15 MG capsule Take 15 mg by mouth at bedtime and may repeat dose one time if needed.     No current facility-administered medications for this visit.     PHYSICAL EXAMINATION: ECOG PERFORMANCE STATUS: 1 - Symptomatic but completely ambulatory  Vitals:   09/05/16 1555  BP: 124/64  Pulse: 61  Resp: 17  Temp: 97.7 F (36.5 C)   Filed Weights   09/05/16 1555  Weight: 219 lb 9.6 oz (99.6 kg)    GENERAL:alert, no distress and comfortable SKIN: no acute rashes. EYES: normal, Conjunctiva are pink and non-injected, sclera clear OROPHARYNX:no exudate, no erythema and lips, buccal mucosa, and tongue normal  NECK: supple, thyroid normal size, non-tender, without nodularity LYMPH:no palpable cervical, axillary or inguinal lymphadenopathy . LUNGS: clear to auscultation and percussion with normal breathing effort HEART: regular rate & rhythm and no murmurs and no lower extremity edema ABDOMEN:abdomen soft, non-tender and normal bowel sounds Musculoskeletal:no  cyanosis of digits and no clubbing  NEURO: alert & oriented x 3 with fluent speech.  LABORATORY DATA:   CBC Latest Ref Rng & Units 09/05/2016 06/17/2016 04/11/2016  WBC 4.0 - 10.3 10e3/uL 3.6(L) 3.5(L) 3.0(L)  Hemoglobin 13.0 - 17.1 g/dL 13.3 13.2 13.1  Hematocrit 38.4 - 49.9 % 37.5(L) 37.7(L) 37.7(L)  Platelets 140 - 400 10e3/uL 114(L) 135(L) 119(L)   ANC 300  CMP Latest Ref Rng & Units 09/05/2016 06/17/2016 04/11/2016  Glucose 70 - 140 mg/dl 88 88 83  BUN 7.0 - 26.0 mg/dL 17.3 15.6 12.8  Creatinine 0.7 - 1.3 mg/dL 0.9 0.9 0.9  Sodium 136 - 145 mEq/L 142 142 143  Potassium 3.5 - 5.1 mEq/L 4.1 4.2 4.3  Chloride 101 - 111 mmol/L - - -  CO2 22 - 29 mEq/L _0 Calcium 8.4 - 10.4 mg/dL 9.0 9.0 9.2  Total Protein 6.4 - 8.3 g/dL 6.4 6.3(L) 6.2(L)  Total Bilirubin 0.20 - 1.20 mg/dL 0.61 0.55 0.40  Alkaline Phos 40 - 150 U/L 112 115 95  AST 5 - 34 U/L 26 30 32  ALT 0 - 55 U/L _1 . Lab Results  Component Value Date   LDH 190 09/05/2016     RADIOGRAPHIC  STUDIES: I have personally reviewed the radiological images as listed and agreed with the findings in the report. No results found.   PET LYMPHOMA NON HODGKINS (W/LOW DOSE CT)07/24/2016 [facility] Result Impression   1.Similar size and hypermetabolic activity in multiple mesenteric lymph nodes. No new hypermetabolic adenopathy is identified. 2.New linear skin thickening with associated hypermetabolic activity posterior and inferior to the right ear. This may be related to minor trauma or other dermatologic condition but would be amenable to visual inspection.  Result Narrative  PET LYMPHOMA NON HODGKINS (W/LOW DOSE CT), 07/24/2016 9:01 AM  INDICATION:C83.30 DLBCL (diffuse large B cell lymphoma) (HCC)  COMPARISON: PET/CT 05/15/2016, outside PET/CT 03/12/2016, outside CT CAP 06/21/2015   TECHNIQUE: 58 minutes after the intravenous injection of 15.47 mCi F-18 FDG, images were obtained from the skull  base through the thighs. These images were attenuation corrected using CT. Standardized uptake values (SUV) were calculated using a lean body mass algorithm. Blood glucose at the time of injection was 77 mg/dL.  [facility] and its affiliates are committed to minimizing radiation dose to patients while maintaining necessary diagnostic image quality. All CT scans are therefore performed using "As Low As Reasonably Achievable (ALARA)" protocols with either manual or automated exposure controls calibrated to the age and size of each patient.  LIMITATIONS: The low-dose CT acquisition was performed only for attenuation correction/activity localization. There is no intravenous contrast, further limiting the CT component of the study. This modality has limited utility for detection or characterization of small lung nodules. Evaluation of the vasculature is limited by lack of IV contrast. Evaluation of the kidney, ureters, and bladder are limited by urinary excretion of radiotracer. Physiologic bowel uptake of FDG and lack of CT contrast limit evaluation of the bowel.   FINDINGS:  SUVmax of blood pool: 1.7 SUVmax of liver: 2.3  HEAD and NECK: Mild linear skin thickening with associated hypermetabolic activity posterior and inferior to the right ear (series 3 image 39), new from prior.  No abnormal FDG uptake is seen in the face, orbits, sinuses, oral cavity, or thyroid. Ancillary head and neck CT findings: None.  CHEST: Similar focal consolidative opacity in the posterior/inferior right lower lobe with mild hypermetabolic activity compared to prior studies. Mild hypermetabolic activity is also associated with a linear opacity in the left lower lobe. Findings may reflect scarring/atelectasis.  No abnormal FDG uptake is seen in the heart, pleura, esophagus, hila/mediastinum, axilla, or breasts. Ancillary chest CT findings: Right IJ vascular catheter tip  terminates in the SVC. Similar 3 mm nodule in the right upper lobe compared to the prior study (series 2 image 116). Similar 4 mm subpleural right lower lobe nodule (series 3 image 126). Similar 2 mm nodule adjacent to the right major fissure (series 3 image 100).  ABDOMEN/PELVIS: Similar faint fat stranding in the jejunal mesentery. Similar hypermetabolic activity in multiple mildly enlarged mesenteric lymph nodes in the central and left abdomen which are similar in size to the prior study (series 3 images 190 and 200).  No abnormal FDG uptake is seen in the liver, spleen, gallbladder, pancreas, adrenals, or reproductive tract. Ancillary abdomen and pelvis CT findings: Pneumobilia with a small locule of gas in the gallbladder fundus, similar to the prior outside CT dated 06/21/2015 and possibly postprocedural in etiology.   MUSCULOSKELETAL: Similar hypermetabolic activity in the soft tissues overlying the coccyx.  Similar hypermetabolic activity in the soft tissues between the anterior right first rib and clavicle.  No abnormal FDG uptake is seen in the bones. Ancillary musculoskeletal CT findings: Status post C5 corpectomy.  Expected physiologic activity within the kidneys, ureter, bladder, oropharynx, salivary glands, stomach, bowel and brain.     PET LYMPHOMA NON HODGKINS (W/LOW DOSE CT)05/15/2016  [facility]  Result Impression   1.  Persistent hypermetabolic activity in multiple mesenteric lymph nodes. 2.  Small consolidative opacity in the right lower lobe no longer exhibits hypermetabolic activity and is favored to reflect resolving infection. 3.  Mild hypermetabolic activity in the soft tissues surrounding the coccyx is similar to prior and nonspecific. Clinical correlation is recommended. 4.  Focus of hypermetabolic activity in the soft tissues adjacent to the right first rib is favored to be physiologic, however attention on follow-up is  recommended. 5.  Ancillary CT findings as above.    ASSESSMENT & PLAN:   1) Diffuse large B-cell lymphoma stage IV AE with involvement of mesenteric, inguinal lymph nodes and C5 vertebral body with spinal cord impingement  Patient is status post treatment as noted above. 2) history of capillary leak syndrome with ARDS requiring intubation due to G-CSF. Plan -Patient has no clinical evidence of lymphoma recurrence/progression at this time.  LDH level is within normal limits. No constitutional symptoms.  -PET/CT scan from 07/24/2016 showed no evidence of disease progression/recurrence at this time. -[facility] -I shall see him back in 3 months with repeat labs -PET/CT scan is negative we discussed about removing the port. In the interim he will get port flushes every 6 weeks  2)Acid reflux and gastritis likely related to steroid use -controlled -Continue PPI  3) Decubitus Ulcer over sacral area - completely resolved 4) Insomnia/Depression - resolved. Off all medications 5) Significant muscle loss due to steroids/hospitalization/debility- has improved back to baseline now with improved physical activity currently is walking several miles a day and playing golf.  6)right lower extremity distal DVT. Resolved. Completed 6 months of treatment in 02/2016  7) Mild chronic thrombocytopenia- platelets 114 k. monitoring.  -Return to care with Dr. KIrene Limboin 3 months with CBC, CMP, LDH  I spent 20 minutes counseling the patient face to face. The total time spent in the appointment was 25 minutes and more than 50% was on counseling and direct patient cares.  GSullivan LoneMD MNorth Light PlantHematology/Oncology Physician [facility] (Office):       [phone](Work cell):  [phone](Fax):           [phone]

## 2016-09-20 IMAGING — CT CT ANGIO CHEST
2 of 6 series · 19 of 36 positions shown · IV contrast (OMNIPAQUE)
Comparison: 06/21/2015

CLINICAL DATA: Hypoxia, cough, and shortness of breath for 2 days.
Pneumonia. Large B-cell lymphoma with ongoing chemotherapy.
Productive cough with fever and chills.

EXAM:
CT ANGIOGRAPHY CHEST WITH CONTRAST
TECHNIQUE: Multidetector CT imaging of the chest was performed using the
standard protocol during bolus administration of intravenous
contrast. Multiplanar CT image reconstructions and MIPs were
obtained to evaluate the vascular anatomy.
CONTRAST:  100mL OMNIPAQUE IOHEXOL 350 MG/ML SOLN

[Series 7: pe thins @ 1mm · axial · 0.85mm/px · z∈[-286,-16]mm · 18 of 300 slices shown]
[im 15/300  lung]
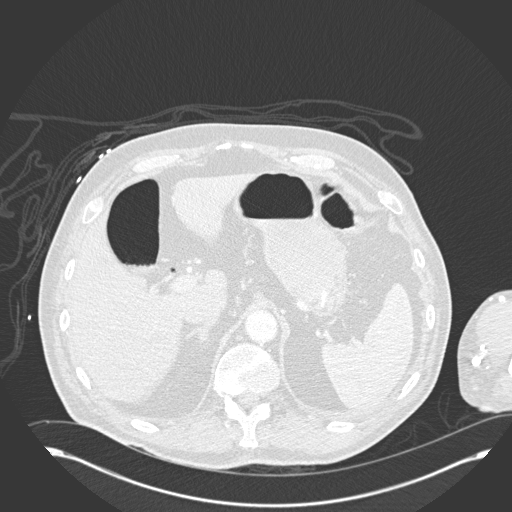
[im 30/300  mediastinal]
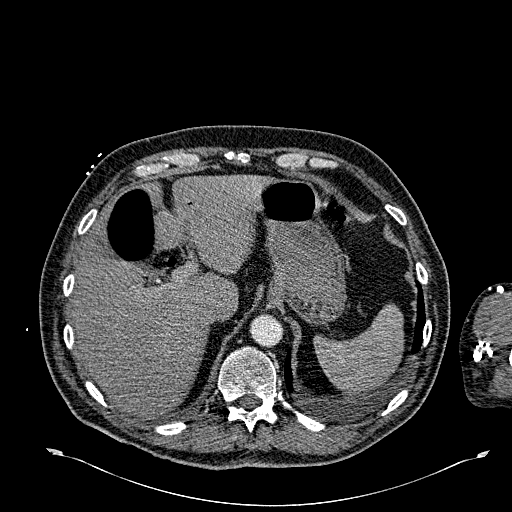
[im 45/300  lung]
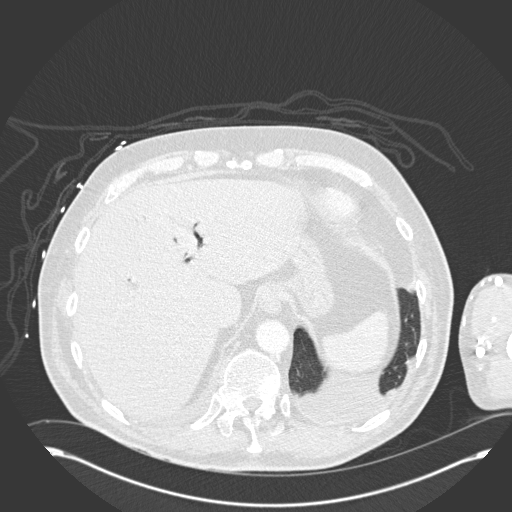
[im 60/300  mediastinal]
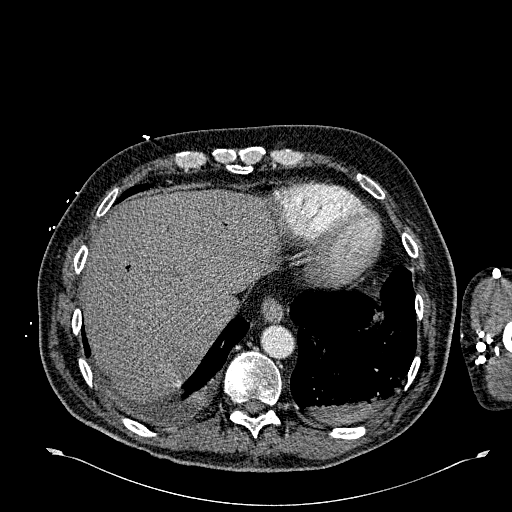
[im 75/300  lung]
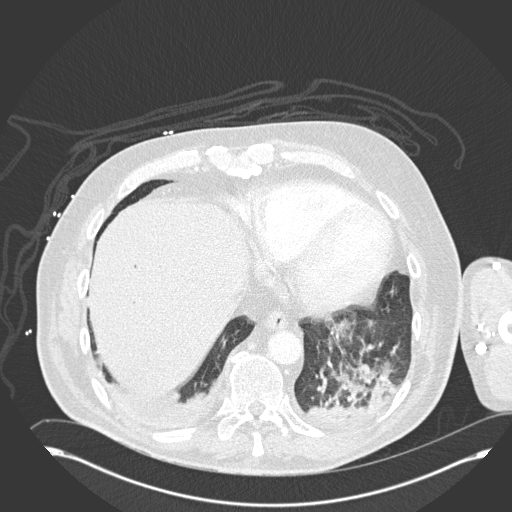
[im 90/300  mediastinal]
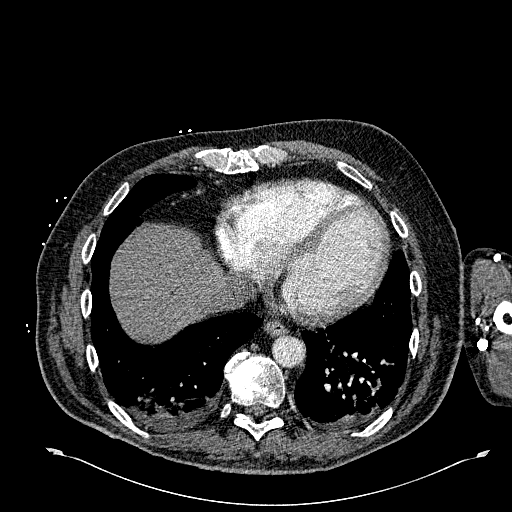
[im 105/300  lung]
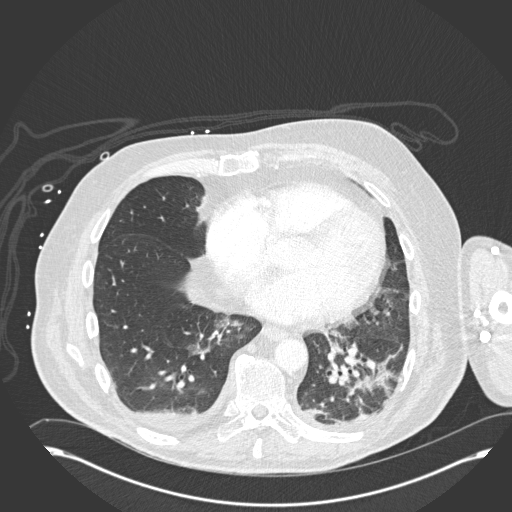
[im 120/300  mediastinal]
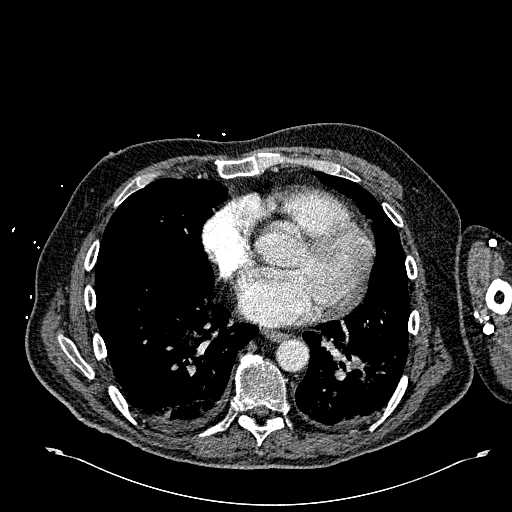
[im 135/300  lung]
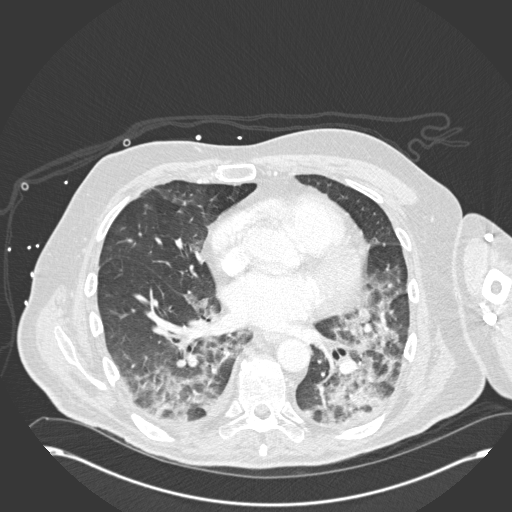
[im 165/300  mediastinal]
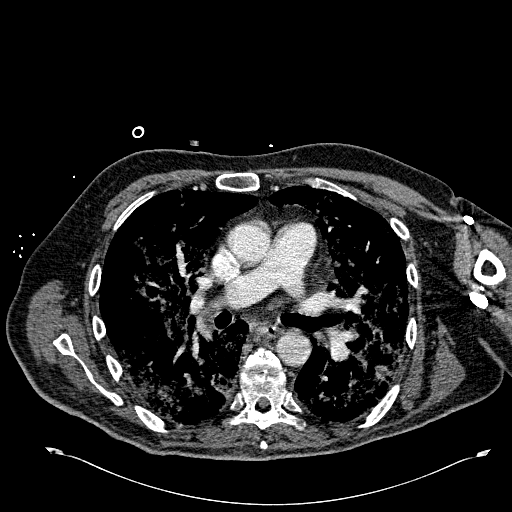
[im 180/300  lung]
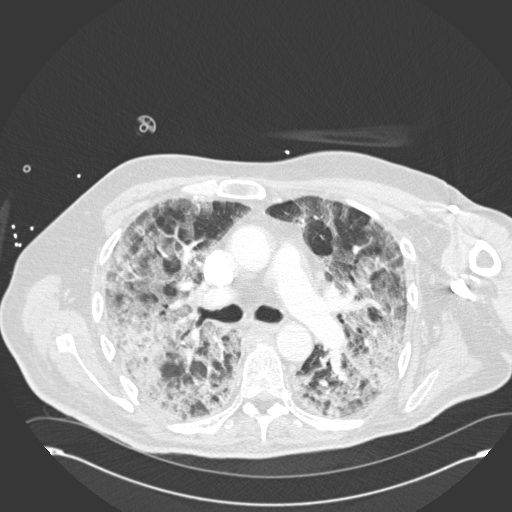
[im 195/300  mediastinal]
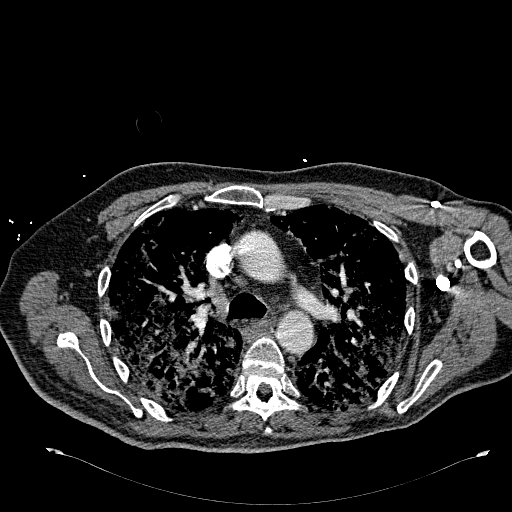
[im 210/300  lung]
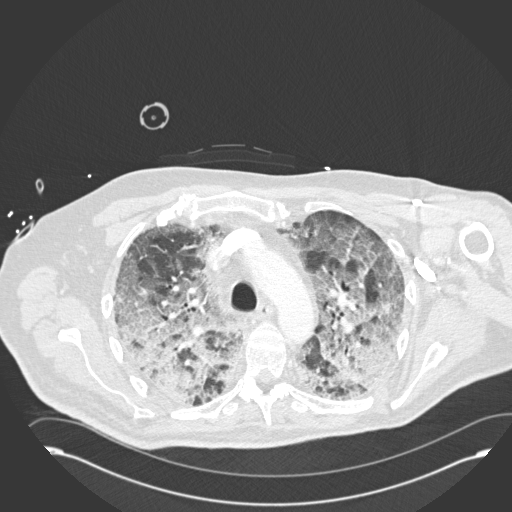
[im 225/300  mediastinal]
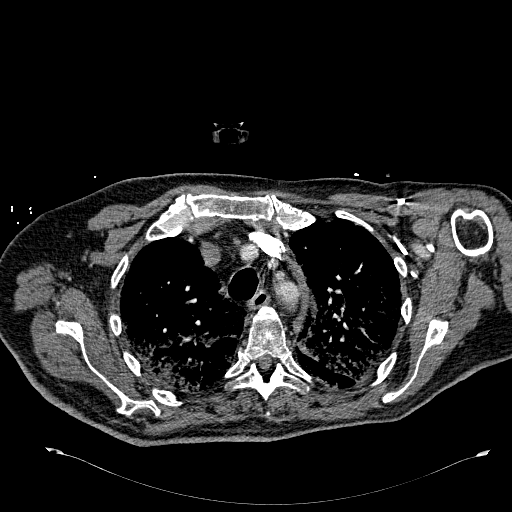
[im 240/300  lung]
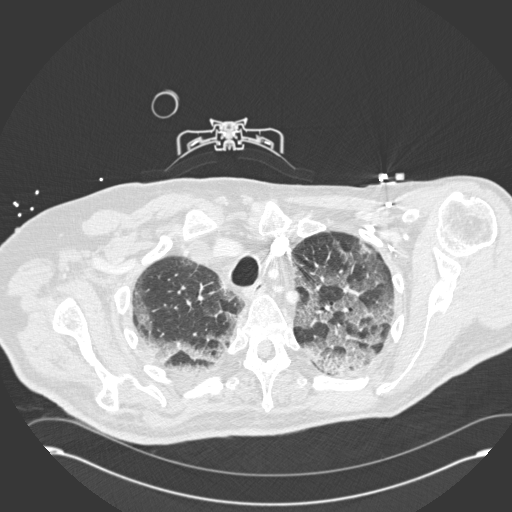
[im 255/300  mediastinal]
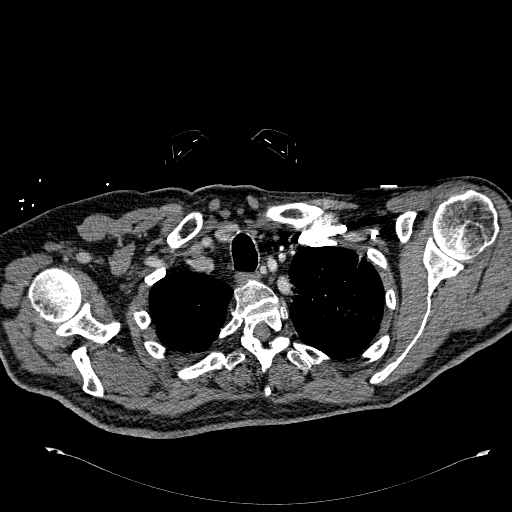
[im 270/300  lung]
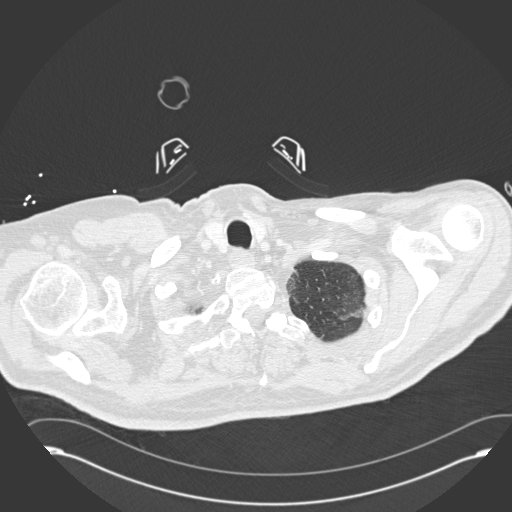
[im 285/300  mediastinal]
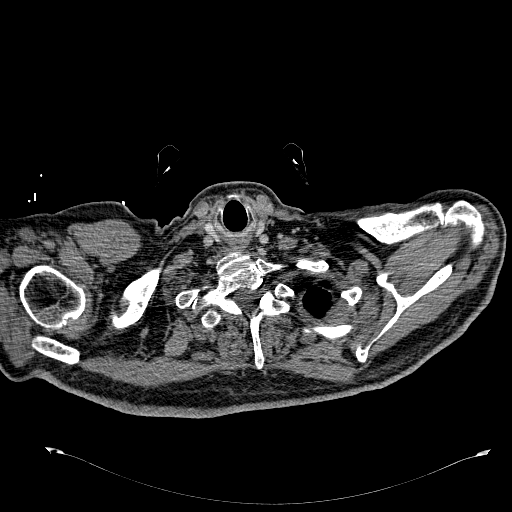

[Series 602: <mpr thick range> · coronal · 0.85mm/px · 1 of 142 slices shown]
[im 71/142  mediastinal]
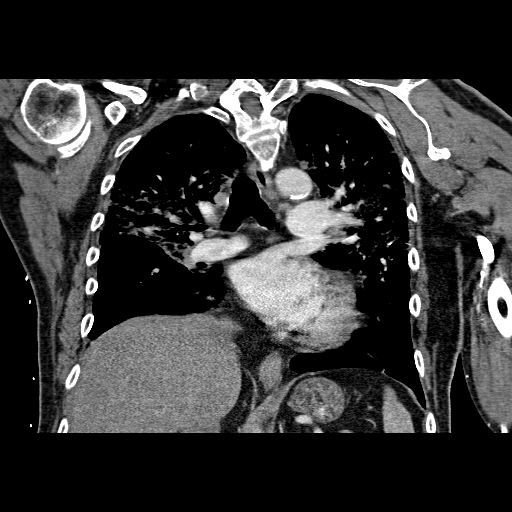

[19 of 36 positions shown; findings below may reference images not displayed]

FINDINGS: THORACIC INLET/BODY WALL:

No acute abnormality.

MEDIASTINUM:

Normal heart size. No pericardial effusion. Suboptimal pulmonary
artery opacification due to bolus dispersion and intermittent
motion, but diagnostic and negative for pulmonary embolism. Four
vessel aortic arch without aortic dissection or aneurysm.

LUNG WINDOWS:

There is ground-glass patchy lung opacity with apical predominance.
The opacities are fairly symmetric. No interlobular septal
thickening at the bases or in non opacified portions of the lungs. A
subpleural nodule in the right lower lobe on lung series image 54 is
stable, appearance favoring incidental lymph node. Mild dependent
atelectasis and trace pleural effusions.

No intrathoracic adenopathy.

UPPER ABDOMEN:

Pneumobilia which is chronic.  No indication of abdominal pain.

OSSEOUS:

No acute fracture.  No suspicious lytic or blastic lesions.

Review of the MIP images confirms the above findings.
IMPRESSION: 1. Bilateral airspace disease consistent with atypical pneumonia or
noncardiogenic edema.
2. No evidence of pulmonary embolism.
3. Chronic pneumobilia.

## 2016-09-21 IMAGING — DX DG ABD PORTABLE 1V
1 series · 1 of 1 positions shown · non-contrast
Comparison: None.

CLINICAL DATA: OG tube placement

EXAM:
PORTABLE ABDOMEN - 1 VIEW

[abdomen kub]
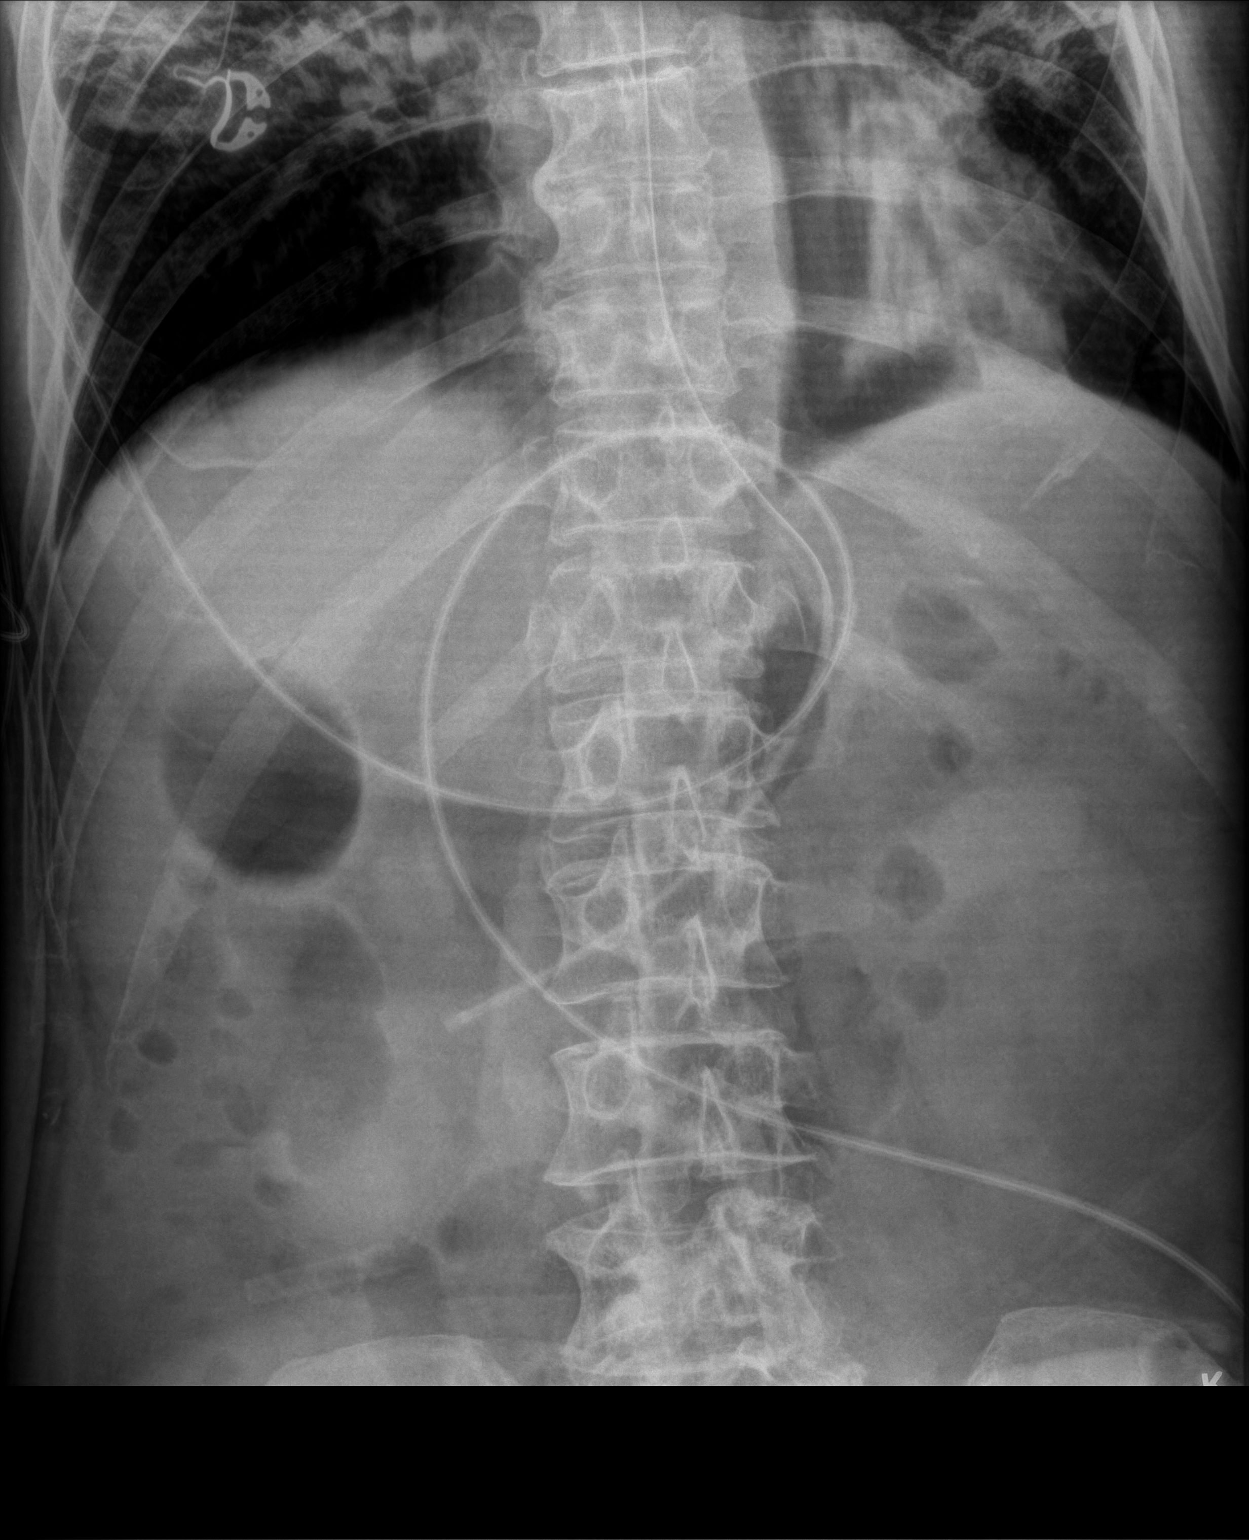

[1 of 1 positions shown; findings below may reference images not displayed]

FINDINGS: Enteric tube head tip is in the transpyloric region, likely distal
stomach. Nonobstructive bowel gas pattern. No free air organomegaly.
IMPRESSION: NG tube tip in the transpyloric region, likely distal stomach

## 2016-09-21 IMAGING — DX DG CHEST 1V PORT
1 series · 1 of 1 positions shown · non-contrast
Comparison: August 22, 2015 study obtained earlier in the day

CLINICAL DATA: ARDS

EXAM:
PORTABLE CHEST - 1 VIEW

[chest ap]
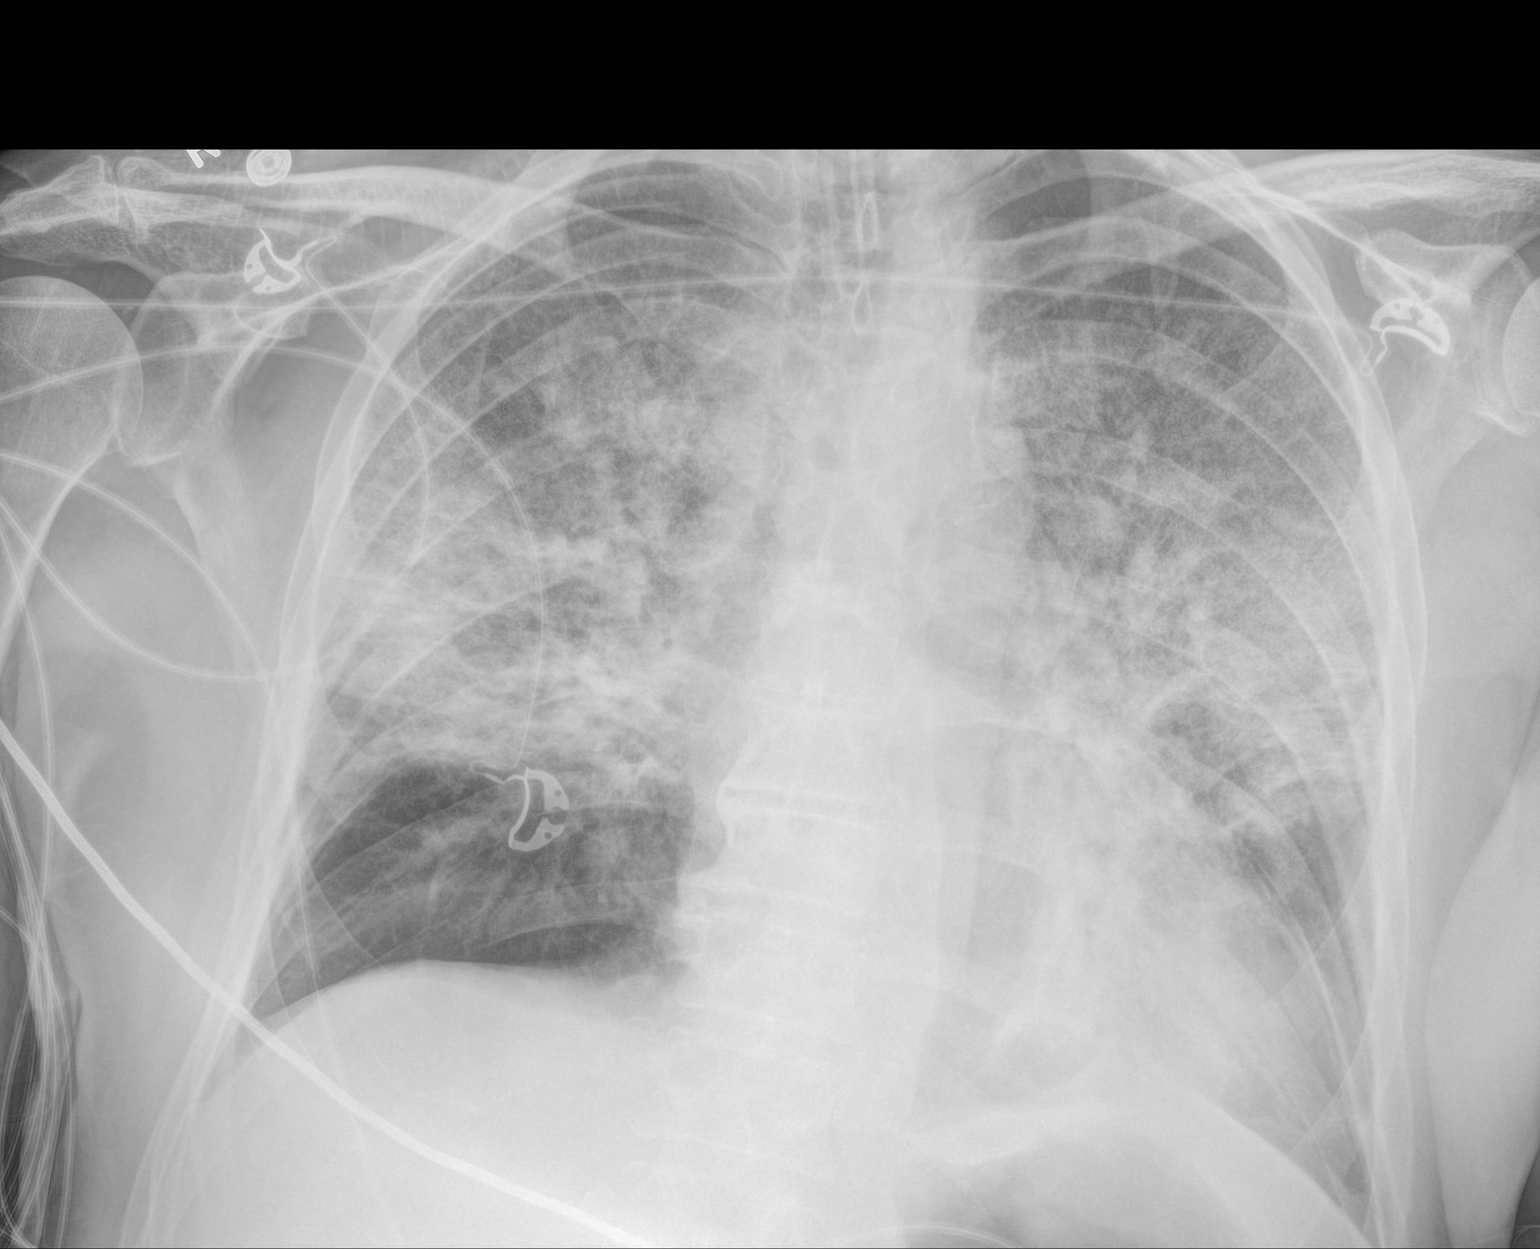

[1 of 1 positions shown; findings below may reference images not displayed]

FINDINGS: Endotracheal tube tip is 7.3 cm above the carina. No pneumothorax.
There is widespread airspace consolidation bilaterally, most
pronounced in the mid and upper lung zones, stable. There is also
patchy infiltrate in the left base, stable. No new opacity. Heart is
upper normal in size with pulmonary vascularity within normal
limits. No adenopathy apparent.
IMPRESSION: Endotracheal tube as described without pneumothorax. Areas of
interstitial and alveolar opacity bilaterally are stable. The
appearance is consistent with ARDS. Superimposed pneumonia is
suspected. No change in cardiac silhouette.

## 2016-10-17 ENCOUNTER — Ambulatory Visit (HOSPITAL_BASED_OUTPATIENT_CLINIC_OR_DEPARTMENT_OTHER): Payer: PPO

## 2016-10-17 DIAGNOSIS — Z452 Encounter for adjustment and management of vascular access device: Secondary | ICD-10-CM | POA: Diagnosis not present

## 2016-10-17 DIAGNOSIS — C8338 Diffuse large B-cell lymphoma, lymph nodes of multiple sites: Secondary | ICD-10-CM | POA: Diagnosis not present

## 2016-10-17 DIAGNOSIS — Z95828 Presence of other vascular implants and grafts: Secondary | ICD-10-CM

## 2016-10-17 DIAGNOSIS — C8330 Diffuse large B-cell lymphoma, unspecified site: Secondary | ICD-10-CM

## 2016-10-17 MED ORDER — HEPARIN SOD (PORK) LOCK FLUSH 100 UNIT/ML IV SOLN
500.0000 [IU] | Freq: Once | INTRAVENOUS | Status: AC | PRN
  Administered 2016-10-17: 500 [IU] via INTRAVENOUS
  Filled 2016-10-17: qty 5

## 2016-10-17 MED ORDER — SODIUM CHLORIDE 0.9 % IJ SOLN
10.0000 mL | INTRAMUSCULAR | Status: DC | PRN
  Administered 2016-10-17: 10 mL via INTRAVENOUS
  Filled 2016-10-17: qty 10

## 2016-12-05 ENCOUNTER — Ambulatory Visit (HOSPITAL_BASED_OUTPATIENT_CLINIC_OR_DEPARTMENT_OTHER): Payer: No Typology Code available for payment source | Admitting: Hematology

## 2016-12-05 ENCOUNTER — Other Ambulatory Visit: Payer: PPO

## 2016-12-05 ENCOUNTER — Encounter: Payer: Self-pay | Admitting: Hematology

## 2016-12-05 ENCOUNTER — Telehealth: Payer: Self-pay | Admitting: Hematology

## 2016-12-05 ENCOUNTER — Ambulatory Visit (HOSPITAL_BASED_OUTPATIENT_CLINIC_OR_DEPARTMENT_OTHER): Payer: No Typology Code available for payment source

## 2016-12-05 VITALS — BP 107/87 | HR 68 | Temp 97.9°F | Resp 18 | Ht 73.0 in | Wt 217.2 lb

## 2016-12-05 DIAGNOSIS — D696 Thrombocytopenia, unspecified: Secondary | ICD-10-CM | POA: Diagnosis not present

## 2016-12-05 DIAGNOSIS — C8330 Diffuse large B-cell lymphoma, unspecified site: Secondary | ICD-10-CM

## 2016-12-05 DIAGNOSIS — Z95828 Presence of other vascular implants and grafts: Secondary | ICD-10-CM

## 2016-12-05 DIAGNOSIS — K2970 Gastritis, unspecified, without bleeding: Secondary | ICD-10-CM

## 2016-12-05 DIAGNOSIS — K219 Gastro-esophageal reflux disease without esophagitis: Secondary | ICD-10-CM | POA: Diagnosis not present

## 2016-12-05 DIAGNOSIS — C8338 Diffuse large B-cell lymphoma, lymph nodes of multiple sites: Secondary | ICD-10-CM

## 2016-12-05 DIAGNOSIS — Z86718 Personal history of other venous thrombosis and embolism: Secondary | ICD-10-CM | POA: Diagnosis not present

## 2016-12-05 LAB — CBC & DIFF AND RETIC
BASO%: 0.5 % (ref 0.0–2.0)
Basophils Absolute: 0 10*3/uL (ref 0.0–0.1)
EOS%: 4.3 % (ref 0.0–7.0)
Eosinophils Absolute: 0.2 10*3/uL (ref 0.0–0.5)
HCT: 38.8 % (ref 38.4–49.9)
HGB: 13.3 g/dL (ref 13.0–17.1)
Immature Retic Fract: 2.3 % — ABNORMAL LOW (ref 3.00–10.60)
LYMPH#: 1.2 10*3/uL (ref 0.9–3.3)
LYMPH%: 32.1 % (ref 14.0–49.0)
MCH: 30.4 pg (ref 27.2–33.4)
MCHC: 34.3 g/dL (ref 32.0–36.0)
MCV: 88.6 fL (ref 79.3–98.0)
MONO#: 0.5 10*3/uL (ref 0.1–0.9)
MONO%: 13 % (ref 0.0–14.0)
NEUT%: 50.1 % (ref 39.0–75.0)
NEUTROS ABS: 1.8 10*3/uL (ref 1.5–6.5)
PLATELETS: 123 10*3/uL — AB (ref 140–400)
RBC: 4.38 10*6/uL (ref 4.20–5.82)
RDW: 13.3 % (ref 11.0–14.6)
Retic %: 0.79 % — ABNORMAL LOW (ref 0.80–1.80)
Retic Ct Abs: 34.6 10*3/uL — ABNORMAL LOW (ref 34.80–93.90)
WBC: 3.7 10*3/uL — AB (ref 4.0–10.3)

## 2016-12-05 LAB — COMPREHENSIVE METABOLIC PANEL
ALT: 31 U/L (ref 0–55)
ANION GAP: 8 meq/L (ref 3–11)
AST: 31 U/L (ref 5–34)
Albumin: 3.7 g/dL (ref 3.5–5.0)
Alkaline Phosphatase: 113 U/L (ref 40–150)
BILIRUBIN TOTAL: 0.5 mg/dL (ref 0.20–1.20)
BUN: 17.1 mg/dL (ref 7.0–26.0)
CHLORIDE: 108 meq/L (ref 98–109)
CO2: 25 meq/L (ref 22–29)
Calcium: 9.2 mg/dL (ref 8.4–10.4)
Creatinine: 0.9 mg/dL (ref 0.7–1.3)
EGFR: 88 mL/min/{1.73_m2} — AB (ref >90)
GLUCOSE: 84 mg/dL (ref 70–140)
POTASSIUM: 4.5 meq/L (ref 3.5–5.1)
SODIUM: 141 meq/L (ref 136–145)
Total Protein: 6.4 g/dL (ref 6.4–8.3)

## 2016-12-05 LAB — LACTATE DEHYDROGENASE: LDH: 214 U/L (ref 125–245)

## 2016-12-05 MED ORDER — HEPARIN SOD (PORK) LOCK FLUSH 100 UNIT/ML IV SOLN
500.0000 [IU] | Freq: Once | INTRAVENOUS | Status: AC | PRN
  Administered 2016-12-05: 500 [IU] via INTRAVENOUS
  Filled 2016-12-05: qty 5

## 2016-12-05 MED ORDER — SODIUM CHLORIDE 0.9 % IJ SOLN
10.0000 mL | INTRAMUSCULAR | Status: DC | PRN
  Administered 2016-12-05: 10 mL via INTRAVENOUS
  Filled 2016-12-05: qty 10

## 2016-12-05 NOTE — Patient Instructions (Signed)

## 2016-12-05 NOTE — Telephone Encounter (Signed)
Patient stated that he will be having a Flush at [facility] on 01/22/17. Appointments scheduled per 12/05/16 los. Patient was given a copy of the AVS report and appointment schedule, per 12/05/16 los.

## 2016-12-19 ENCOUNTER — Other Ambulatory Visit: Payer: Self-pay | Admitting: Nurse Practitioner

## 2016-12-19 NOTE — Progress Notes (Signed)
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 Cytogenetics and Fish showed BCL 2 positivity but negative for BCL 6 and cMYC CD10 positivity suggestive of possibly GCB subtype.  Patient received first cycle of R CHOP on 08/08/2015 and intrathecal methotrexate with hydrocortisone on 08/09/2015. Patient subsequently was admitted with ARDS likely due to neulasta -treated with high dose steroids and empirically with broad spectrum antibiotics.  2nd cycle of chemotherapy delayed to allow for rehabilitation (was due on 08/31/2015).  Received R-mini-CHOP for cycle 2 and tolerated it without significant cytopenias. Was admitted briefly for a mild lower extremity cellulitis and DVT. Cellulitis now resolved. Patient completing his oral antibiotics.  PET/CT scan on 09/29/2015 with no evidence of disease progression. Good response in his mesenteric lymph nodes. Inguinal lymph nodes on the right  side. Decreased uptake in his C5 level.  No new lesions noted.   Cycle 3 (10/02/2015)  of R- CHOP ( we increased the doxorubicin dose back to 50 mg/m and keep the cyclophosphamide dose reduction at 400 mg/m]  Cycle 4 (10/23/2015) of R-CHOP ( we increased the doxorubicin dose back to 50 mg/m and kept the cyclophosphamide dose reduction at 400 mg/m].  PET/CT 11/10/2015: Shows improvement in most lesions but is noted to have a new FDG avid lymph node in the mesentery of the small bowel.  Cycle 5 on 11/13/2015 R CHOP - received full dose.  Cycle 6 on 12/11/2015 R-CHOP full dose.  Admitted with neutropenic fevers and treatment for possible early pneumonia.  This resolved and he was able to discharge home  01/09/2016 through 01/22/2016: The patient was treated to the C4-C6 levels of the spine to a dose of 30 gray in 10 fractions using a 2 field technique. The patient was also treated to an abdominal lymph node region to a dose of 30 gray in 10 fractions using a 3 field technique. This treatment consisted of a 3-D conformal technique with daily image guidance utilized.   Repeat PET CT scan in March 12 2016 -showed some enlarged mesenteric lymph nodes which were FDG avid.  CT guided FNA of mesenteric lymph nodes at [facility] on 03/29/2016 - showed scant cellularity. No overt evidence of lymphoma.  Rpt PET/CT 05/15/2016 at [facility]- show similar appearing enlarged mesenteric lymph nodes which are FDG avid. No change in size and no other evidence of disease progression.  PET/CT 07/24/2016 - and [facility] shows no evidence of disease progression  INTERVAL HISTORY:  Ethan Stewart is here  for his scheduled 3 month follow-up for diffuse large B-cell lymphoma. He notes no acute new symptoms. No fevers no chills no night sweats no weight loss.  He notes that he is doing well as playing golf and trying to stay physically fit and active and has been eating  well. He notes he is now truly enjoying retirement and has been spending time with his friends and family. He has a followup and repeat PET/CT schedule with Dr Cassell Clement at [facility].  REVIEW OF SYSTEMS:   10 point review of systems was done and is negative except as noted above  I have reviewed the past medical history, past surgical history, social history and family history with the patient and they are unchanged from previous note.  ALLERGIES:  is allergic to neulasta [pegfilgrastim].  MEDICATIONS:  Current Outpatient Prescriptions  Medication Sig Dispense Refill  . aspirin EC 81 MG tablet Take 1 tablet (81 mg total) by mouth daily. 30 tablet 11  . levothyroxine (SYNTHROID, LEVOTHROID) 75 MCG tablet     . Multiple Vitamin (MULTIVITAMIN WITH MINERALS) TABS tablet Take 1 tablet by mouth daily. Reported on 02/19/2016    . SENNA CO by Combination route.    . simvastatin (ZOCOR) 20 MG tablet Take 20 mg by mouth daily.    . temazepam (RESTORIL) 15 MG capsule Take 15 mg by mouth at bedtime and may repeat dose one time if needed.     No current facility-administered medications for this visit.     PHYSICAL EXAMINATION: ECOG PERFORMANCE STATUS: 1 - Symptomatic but completely ambulatory  Vitals:   12/05/16 0957  BP: 107/87  Pulse: 68  Resp: 18  Temp: 97.9 F (36.6 C)   Filed Weights   12/05/16 0957  Weight: 217 lb 3.2 oz (98.5 kg)    GENERAL:alert, no distress and comfortable SKIN: no acute rashes. EYES: normal, Conjunctiva are pink and non-injected, sclera clear OROPHARYNX:no exudate, no erythema and lips, buccal mucosa, and tongue normal  NECK: supple, thyroid normal size, non-tender, without nodularity LYMPH:no palpable cervical, axillary or inguinal lymphadenopathy . LUNGS: clear to auscultation and percussion with normal breathing effort HEART: regular rate & rhythm and no murmurs and no lower extremity edema ABDOMEN:abdomen soft, non-tender and normal bowel  sounds Musculoskeletal:no cyanosis of digits and no clubbing  NEURO: alert & oriented x 3 with fluent speech.  LABORATORY DATA:   CBC Latest Ref Rng & Units 12/05/2016 09/05/2016 06/17/2016  WBC 4.0 - 10.3 10e3/uL 3.7(L) 3.6(L) 3.5(L)  Hemoglobin 13.0 - 17.1 g/dL 13.3 13.3 13.2  Hematocrit 38.4 - 49.9 % 38.8 37.5(L) 37.7(L)  Platelets 140 - 400 10e3/uL 123(L) 114(L) 135(L)   ANC 300  CMP Latest Ref Rng & Units 12/05/2016 09/05/2016 06/17/2016  Glucose 70 - 140 mg/dl 84 88 88  BUN 7.0 - 26.0 mg/dL 17.1 17.3 15.6  Creatinine 0.7 - 1.3 mg/dL 0.9 0.9 0.9  Sodium 136 - 145 mEq/L 141 142 142  Potassium 3.5 - 5.1 mEq/L 4.5 4.1 4.2  Chloride 101 - 111 mmol/L - - -  CO2 22 - 29 mEq/L _0 Calcium 8.4 - 10.4 mg/dL 9.2 9.0 9.0  Total Protein 6.4 - 8.3 g/dL 6.4 6.4 6.3(L)  Total Bilirubin 0.20 - 1.20 mg/dL 0.50 0.61 0.55  Alkaline Phos 40 - 150 U/L 113 112 115  AST 5 - 34 U/L _1 ALT 0 - 55 U/L _2 . Lab Results  Component Value Date   LDH  214 12/05/2016     RADIOGRAPHIC STUDIES: I have personally reviewed the radiological images as listed and agreed with the findings in the report. No results found.   PET LYMPHOMA NON HODGKINS (W/LOW DOSE CT)07/24/2016 [facility] Result Impression   1.Similar size and hypermetabolic activity in multiple mesenteric lymph nodes. No new hypermetabolic adenopathy is identified. 2.New linear skin thickening with associated hypermetabolic activity posterior and inferior to the right ear. This may be related to minor trauma or other dermatologic condition but would be amenable to visual inspection.  Result Narrative  PET LYMPHOMA NON HODGKINS (W/LOW DOSE CT), 07/24/2016 9:01 AM  INDICATION:C83.30 DLBCL (diffuse large B cell lymphoma) (HCC)  COMPARISON: PET/CT 05/15/2016, outside PET/CT 03/12/2016, outside CT CAP 06/21/2015   TECHNIQUE: 58 minutes after the intravenous injection of 15.47 mCi F-18 FDG, images were  obtained from the skull base through the thighs. These images were attenuation corrected using CT. Standardized uptake values (SUV) were calculated using a lean body mass algorithm. Blood glucose at the time of injection was 77 mg/dL.  [facility] and its affiliates are committed to minimizing radiation dose to patients while maintaining necessary diagnostic image quality. All CT scans are therefore performed using "As Low As Reasonably Achievable (ALARA)" protocols with either manual or automated exposure controls calibrated to the age and size of each patient.  LIMITATIONS: The low-dose CT acquisition was performed only for attenuation correction/activity localization. There is no intravenous contrast, further limiting the CT component of the study. This modality has limited utility for detection or characterization of small lung nodules. Evaluation of the vasculature is limited by lack of IV contrast. Evaluation of the kidney, ureters, and bladder are limited by urinary excretion of radiotracer. Physiologic bowel uptake of FDG and lack of CT contrast limit evaluation of the bowel.   FINDINGS:  SUVmax of blood pool: 1.7 SUVmax of liver: 2.3  HEAD and NECK: Mild linear skin thickening with associated hypermetabolic activity posterior and inferior to the right ear (series 3 image 39), new from prior.  No abnormal FDG uptake is seen in the face, orbits, sinuses, oral cavity, or thyroid. Ancillary head and neck CT findings: None.  CHEST: Similar focal consolidative opacity in the posterior/inferior right lower lobe with mild hypermetabolic activity compared to prior studies. Mild hypermetabolic activity is also associated with a linear opacity in the left lower lobe. Findings may reflect scarring/atelectasis.  No abnormal FDG uptake is seen in the heart, pleura, esophagus, hila/mediastinum, axilla, or breasts. Ancillary chest CT findings: Right IJ  vascular catheter tip terminates in the SVC. Similar 3 mm nodule in the right upper lobe compared to the prior study (series 2 image 116). Similar 4 mm subpleural right lower lobe nodule (series 3 image 126). Similar 2 mm nodule adjacent to the right major fissure (series 3 image 100).  ABDOMEN/PELVIS: Similar faint fat stranding in the jejunal mesentery. Similar hypermetabolic activity in multiple mildly enlarged mesenteric lymph nodes in the central and left abdomen which are similar in size to the prior study (series 3 images 190 and 200).  No abnormal FDG uptake is seen in the liver, spleen, gallbladder, pancreas, adrenals, or reproductive tract. Ancillary abdomen and pelvis CT findings: Pneumobilia with a small locule of gas in the gallbladder fundus, similar to the prior outside CT dated 06/21/2015 and possibly postprocedural in etiology.   MUSCULOSKELETAL: Similar hypermetabolic activity in the soft tissues overlying the coccyx.  Similar hypermetabolic activity in the soft tissues between  the anterior right first rib and clavicle.  No abnormal FDG uptake is seen in the bones. Ancillary musculoskeletal CT findings: Status post C5 corpectomy.  Expected physiologic activity within the kidneys, ureter, bladder, oropharynx, salivary glands, stomach, bowel and brain.     PET LYMPHOMA NON HODGKINS (W/LOW DOSE CT)05/15/2016  [facility]  Result Impression   1.  Persistent hypermetabolic activity in multiple mesenteric lymph nodes. 2.  Small consolidative opacity in the right lower lobe no longer exhibits hypermetabolic activity and is favored to reflect resolving infection. 3.  Mild hypermetabolic activity in the soft tissues surrounding the coccyx is similar to prior and nonspecific. Clinical correlation is recommended. 4.  Focus of hypermetabolic activity in the soft tissues adjacent to the right first rib is favored to be physiologic, however  attention on follow-up is recommended. 5.  Ancillary CT findings as above.    ASSESSMENT & PLAN:   1) Diffuse large B-cell lymphoma stage IV AE with involvement of mesenteric, inguinal lymph nodes and C5 vertebral body with spinal cord impingement  Patient is status post treatment as noted above. 2) history of capillary leak syndrome with ARDS requiring intubation likely due to G-CSF. Plan -Patient has no clinical evidence of lymphoma recurrence/progression at this time.  LDH level is within normal limits. No constitutional symptoms.  --He will be getting a repeat PET CT scan again at [facility] with Dr Cassell Clement in Jan 2018. -if PET/CT scan is negative we will have his port removed. In the interim he will get port flushes  2)Acid reflux and gastritis likely related to steroid use -controlled -Continue PPI  3)right lower extremity distal DVT. Resolved. Completed 6 months of treatment in 02/2016  4) Mild chronic thrombocytopenia- platelets 123 k. monitoring.  Return in about 3 months (around 03/05/2017) for labs, appointment with Dr Irene Limbo  I spent 20 minutes counseling the patient face to face. The total time spent in the appointment was 20 minutes and more than 50% was on counseling and direct patient cares.  Sullivan Lone MD [place] [facility] Physician [facility]  (Office):       [phone] (Work cell):  [phone] (Fax):           [phone]

## 2017-01-13 DIAGNOSIS — E038 Other specified hypothyroidism: Secondary | ICD-10-CM | POA: Diagnosis not present

## 2017-01-22 DIAGNOSIS — Z7982 Long term (current) use of aspirin: Secondary | ICD-10-CM | POA: Diagnosis not present

## 2017-01-22 DIAGNOSIS — E785 Hyperlipidemia, unspecified: Secondary | ICD-10-CM | POA: Diagnosis not present

## 2017-01-22 DIAGNOSIS — Z79899 Other long term (current) drug therapy: Secondary | ICD-10-CM | POA: Diagnosis not present

## 2017-01-22 DIAGNOSIS — C8330 Diffuse large B-cell lymphoma, unspecified site: Secondary | ICD-10-CM | POA: Diagnosis not present

## 2017-01-22 DIAGNOSIS — Z87891 Personal history of nicotine dependence: Secondary | ICD-10-CM | POA: Diagnosis not present

## 2017-01-22 DIAGNOSIS — R918 Other nonspecific abnormal finding of lung field: Secondary | ICD-10-CM | POA: Diagnosis not present

## 2017-01-23 DIAGNOSIS — R69 Illness, unspecified: Secondary | ICD-10-CM | POA: Diagnosis not present

## 2017-01-23 DIAGNOSIS — G479 Sleep disorder, unspecified: Secondary | ICD-10-CM | POA: Diagnosis not present

## 2017-01-23 DIAGNOSIS — L989 Disorder of the skin and subcutaneous tissue, unspecified: Secondary | ICD-10-CM | POA: Diagnosis not present

## 2017-01-28 ENCOUNTER — Other Ambulatory Visit: Payer: Self-pay | Admitting: Hematology

## 2017-01-28 DIAGNOSIS — Z95828 Presence of other vascular implants and grafts: Secondary | ICD-10-CM

## 2017-01-31 ENCOUNTER — Other Ambulatory Visit: Payer: Self-pay | Admitting: Radiology

## 2017-02-03 ENCOUNTER — Ambulatory Visit (HOSPITAL_COMMUNITY)
Admission: RE | Admit: 2017-02-03 | Discharge: 2017-02-03 | Disposition: A | Discharge status: Home / Self Care | Payer: Medicare HMO | Source: Ambulatory Visit | Attending: Hematology | Admitting: Hematology

## 2017-02-03 ENCOUNTER — Encounter (HOSPITAL_COMMUNITY): Payer: Self-pay

## 2017-02-03 DIAGNOSIS — Z923 Personal history of irradiation: Secondary | ICD-10-CM | POA: Insufficient documentation

## 2017-02-03 DIAGNOSIS — Z7982 Long term (current) use of aspirin: Secondary | ICD-10-CM | POA: Diagnosis not present

## 2017-02-03 DIAGNOSIS — K219 Gastro-esophageal reflux disease without esophagitis: Secondary | ICD-10-CM | POA: Insufficient documentation

## 2017-02-03 DIAGNOSIS — Z9221 Personal history of antineoplastic chemotherapy: Secondary | ICD-10-CM | POA: Diagnosis not present

## 2017-02-03 DIAGNOSIS — R69 Illness, unspecified: Secondary | ICD-10-CM | POA: Diagnosis not present

## 2017-02-03 DIAGNOSIS — Z95828 Presence of other vascular implants and grafts: Secondary | ICD-10-CM

## 2017-02-03 DIAGNOSIS — Z452 Encounter for adjustment and management of vascular access device: Secondary | ICD-10-CM | POA: Insufficient documentation

## 2017-02-03 DIAGNOSIS — C8330 Diffuse large B-cell lymphoma, unspecified site: Secondary | ICD-10-CM | POA: Diagnosis not present

## 2017-02-03 DIAGNOSIS — F419 Anxiety disorder, unspecified: Secondary | ICD-10-CM | POA: Insufficient documentation

## 2017-02-03 HISTORY — PX: IR GENERIC HISTORICAL: IMG1180011

## 2017-02-03 LAB — CBC WITH DIFFERENTIAL/PLATELET
Basophils Absolute: 0 10*3/uL (ref 0.0–0.1)
Basophils Relative: 0 %
EOS PCT: 2 %
Eosinophils Absolute: 0.1 10*3/uL (ref 0.0–0.7)
HCT: 38.9 % — ABNORMAL LOW (ref 39.0–52.0)
Hemoglobin: 13.6 g/dL (ref 13.0–17.0)
LYMPHS ABS: 1.2 10*3/uL (ref 0.7–4.0)
LYMPHS PCT: 32 %
MCH: 30.4 pg (ref 26.0–34.0)
MCHC: 35 g/dL (ref 30.0–36.0)
MCV: 86.8 fL (ref 78.0–100.0)
MONO ABS: 0.5 10*3/uL (ref 0.1–1.0)
Monocytes Relative: 12 %
Neutro Abs: 2.2 10*3/uL (ref 1.7–7.7)
Neutrophils Relative %: 54 %
PLATELETS: 125 10*3/uL — AB (ref 150–400)
RBC: 4.48 MIL/uL (ref 4.22–5.81)
RDW: 13.6 % (ref 11.5–15.5)
WBC: 3.9 10*3/uL — ABNORMAL LOW (ref 4.0–10.5)

## 2017-02-03 LAB — BASIC METABOLIC PANEL
Anion gap: 6 (ref 5–15)
BUN: 22 mg/dL — AB (ref 6–20)
CHLORIDE: 110 mmol/L (ref 101–111)
CO2: 25 mmol/L (ref 22–32)
Calcium: 9.1 mg/dL (ref 8.9–10.3)
Creatinine, Ser: 0.94 mg/dL (ref 0.61–1.24)
GFR calc Af Amer: >60 mL/min (ref >60)
GLUCOSE: 92 mg/dL (ref 65–99)
POTASSIUM: 4.1 mmol/L (ref 3.5–5.1)
Sodium: 141 mmol/L (ref 135–145)

## 2017-02-03 LAB — PROTIME-INR
INR: 0.94
Prothrombin Time: 12.6 seconds (ref 11.4–15.2)

## 2017-02-03 MED ORDER — FENTANYL CITRATE (PF) 100 MCG/2ML IJ SOLN
INTRAMUSCULAR | Status: AC
  Filled 2017-02-03: qty 4

## 2017-02-03 MED ORDER — SODIUM CHLORIDE 0.9 % IV SOLN
INTRAVENOUS | Status: DC
  Administered 2017-02-03: 08:00:00 via INTRAVENOUS

## 2017-02-03 MED ORDER — MIDAZOLAM HCL 2 MG/2ML IJ SOLN
INTRAMUSCULAR | Status: AC | PRN
  Administered 2017-02-03 (×3): 1 mg via INTRAVENOUS

## 2017-02-03 MED ORDER — MIDAZOLAM HCL 2 MG/2ML IJ SOLN
INTRAMUSCULAR | Status: AC
  Filled 2017-02-03: qty 6

## 2017-02-03 MED ORDER — FENTANYL CITRATE (PF) 100 MCG/2ML IJ SOLN
INTRAMUSCULAR | Status: AC | PRN
  Administered 2017-02-03: 50 ug via INTRAVENOUS
  Administered 2017-02-03: 25 ug via INTRAVENOUS

## 2017-02-03 MED ORDER — LIDOCAINE HCL 1 % IJ SOLN
INTRAMUSCULAR | Status: AC
  Filled 2017-02-03: qty 20

## 2017-02-03 MED ORDER — CEFAZOLIN SODIUM-DEXTROSE 2-4 GM/100ML-% IV SOLN
2.0000 g | INTRAVENOUS | Status: AC
  Administered 2017-02-03: 2 g via INTRAVENOUS
  Filled 2017-02-03: qty 100

## 2017-02-03 NOTE — H&P (Signed)
Referring Physician(s): Brunetta Genera  Supervising Physician: Markus Daft  Patient Status:  [facility] OP  Chief Complaint:  "I'm getting my port out"  Subjective:  Patient familiar to IR service from prior right internal jugular Port-A-Cath placement in October 2016. He has a history of diffuse large B-cell lymphoma status post surgery/chemotherapy/radiation therapy. He currently has no clinical evidence of recurrence or disease progression and presents again today for Port-A-Cath removal. He is currently asymptomatic. Past Medical History:  Diagnosis Date  . Anxiety   . Arm numbness 08/07/15   Limited movement due to tumor on spine  . Bone cancer ([place])     tumor on C5  . 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 Diffuse large B cell lymphoma ([place])    biposy 07/25/2105  . GERD (gastroesophageal reflux disease)   . H. pylori infection   . History of bleeding ulcers 1990's  . History of blood transfusion    Past Surgical History:  Procedure Laterality Date  . ANTERIOR CERVICAL CORPECTOMY N/A 07/26/2015   Procedure: Cervical five Corpectomy/Cervical four-six Fusion/Plate ;  Surgeon: Eustace Moore, MD;  Location: [place] [facility];  Service: Neurosurgery;  Laterality: N/A;  C5 Corpectomy/C4-6 Fusion/Plate C4-6  . COLONOSCOPY    . EYE SURGERY Right    catheter  . HERNIA REPAIR Bilateral    with mesh     Allergies: Neulasta [pegfilgrastim]  Medications: Prior to Admission medications   Medication Sig Start Date End Date Taking? Authorizing Provider  levothyroxine (SYNTHROID, LEVOTHROID) 75 MCG tablet  11/21/16  Yes Historical Provider, MD  Multiple Vitamin (MULTIVITAMIN WITH MINERALS) TABS tablet Take 1 tablet by mouth daily. Reported on 02/19/2016   Yes Historical Provider, MD  simvastatin (ZOCOR) 20 MG tablet Take 20 mg by mouth daily.   Yes Historical Provider, MD  temazepam (RESTORIL) 15 MG capsule Take 15 mg by mouth at bedtime and may repeat dose one time if needed.   Yes  Historical Provider, MD  aspirin EC 81 MG tablet Take 1 tablet (81 mg total) by mouth daily. 03/14/16   Brunetta Genera, MD  SENNA CO by Combination route.    Historical Provider, MD     Vital Signs: BP (!) 142/69 (BP Location: Right Arm)   Pulse 69   Temp 98 F (36.7 C) (Oral)   Resp 16   SpO2 98%   Physical Exam awake, alert. Chest clear to auscultation bilaterally. Clean, intact, NT rt chest wall PAC; Heart with regular rate and rhythm. Abdomen soft, positive bowel sounds, nontender. Lower extremities with no edema  Imaging: No results found.  Labs:  CBC:  Recent Labs  06/17/16 1501 09/05/16 1526 12/05/16 0907 02/03/17 0754  WBC 3.5* 3.6* 3.7* 3.9*  HGB 13.2 13.3 13.3 13.6  HCT 37.7* 37.5* 38.8 38.9*  PLT 135* 114* 123* 125*    COAGS:  Recent Labs  02/03/17 0754  INR 0.94    BMP:  Recent Labs  06/17/16 1501 09/05/16 1526 12/05/16 0907 02/03/17 0754  NA 142 142 141 141  K 4.2 4.1 4.5 4.1  CL  --   --   --  110  CO2 26 27 25 25   GLUCOSE 88 88 84 92  BUN 15.6 17.3 17.1 22*  CALCIUM 9.0 9.0 9.2 9.1  CREATININE 0.9 0.9 0.9 0.94  GFRNONAA  --   --   --  >60  GFRAA  --   --   --  >60    LIVER FUNCTION TESTS:  Recent Labs  04/11/16 0839 06/17/16 1501 09/05/16 1526 12/05/16 0907  BILITOT 0.40 0.55 0.61 0.50  AST 32 30 26 31   ALT 27 29 23 31   ALKPHOS 95 115 112 113  PROT 6.2* 6.3* 6.4 6.4  ALBUMIN 3.7 3.9 3.8 3.7    Assessment and Plan: Pt with history of diffuse large B-cell lymphoma, status post surgery/chemotherapy/radiation therapy. He currently has no clinical evidence of recurrence or disease progression and presents again today for Port-A-Cath removal. Details/risks of procedure, including but not limited to, internal bleeding, infection, injury to adjacent structures, discussed with patient and family with their understanding and consent.   Electronically Signed: D. Rowe Robert 02/03/2017, 8:38 AM   I spent a total of 20  minutes at the the patient's bedside AND on the patient's hospital floor or unit, greater than 50% of which was counseling/coordinating care for Port-A-Cath removal

## 2017-02-03 NOTE — Procedures (Signed)
Successful removal of right chest port.  No immediate complication.  Minimal bleeding.  See full report in IMAGING.

## 2017-02-03 NOTE — Discharge Instructions (Signed)
Incision Care, Adult An incision is a surgical cut that is made through your skin. Most incisions are closed after surgery. Your incision may be closed with stitches (sutures), staples, skin glue, or adhesive strips. You may need to return to your health care provider to have sutures or staples removed. This may occur several days to several weeks after your surgery. The incision needs to be cared for properly to prevent infection. How to care for your incision Incision care   Follow instructions from your health care provider about how to take care of your incision. Make sure you:  Wash your hands with soap and water before you change the bandage (dressing). If soap and water are not available, use hand sanitizer.  Change your dressing as told by your health care provider.  Leave sutures, skin glue, or adhesive strips in place. These skin closures may need to stay in place for 2 weeks or longer. If adhesive strip edges start to loosen and curl up, you may trim the loose edges. Do not remove adhesive strips completely unless your health care provider tells you to do that.  Check your incision area every day for signs of infection. Check for:  More redness, swelling, or pain.  More fluid or blood.  Warmth.  Pus or a bad smell.  Ask your health care provider how to clean the incision. This may include:  Using mild soap and water.  Using a clean towel to pat the incision dry after cleaning it.  Applying a cream or ointment. Do this only as told by your health care provider.  Covering the incision with a clean dressing.  Ask your health care provider when you can leave the incision uncovered.  Do not take baths, swim, or use a hot tub until your health care provider approves. Ask your health care provider if you can take showers. You may only be allowed to take sponge baths for bathing. Medicines  If you were prescribed an antibiotic medicine, cream, or ointment, take or apply the  antibiotic as told by your health care provider. Do not stop taking or applying the antibiotic even if your condition improves.  Take over-the-counter and prescription medicines only as told by your health care provider. General instructions  Limit movement around your incision to improve healing.  Avoid straining, lifting, or exercise for the first month, or for as long as told by your health care provider.  Follow instructions from your health care provider about returning to your normal activities.  Ask your health care provider what activities are safe.  Protect your incision from the sun when you are outside for the first 6 months, or for as long as told by your health care provider. Apply sunscreen around the scar or cover it up.  Keep all follow-up visits as told by your health care provider. This is important. Contact a health care provider if:  Your have more redness, swelling, or pain around the incision.  You have more fluid or blood coming from the incision.  Your incision feels warm to the touch.  You have pus or a bad smell coming from the incision.  You have a fever or shaking chills.  You are nauseous or you vomit.  You are dizzy.  Your sutures or staples come undone. Get help right away if:  You have a red streak coming from your incision.  Your incision bleeds through the dressing and the bleeding does not stop with gentle pressure.  The edges of  your incision open up and separate.  You have severe pain.  You have a rash.  You are confused.  You faint.  You have trouble breathing and a fast heartbeat. This information is not intended to replace advice given to you by your health care provider. Make sure you discuss any questions you have with your health care provider. Document Released: 06/28/2005 Document Revised: 08/16/2016 Document Reviewed: 06/26/2016 Elsevier Interactive Patient Education  2017 [employer]. Moderate Conscious Sedation,  Adult, Care After These instructions provide you with information about caring for yourself after your procedure. Your health care provider may also give you more specific instructions. Your treatment has been planned according to current medical practices, but problems sometimes occur. Call your health care provider if you have any problems or questions after your procedure. What can I expect after the procedure? After your procedure, it is common:  To feel sleepy for several hours.  To feel clumsy and have poor balance for several hours.  To have poor judgment for several hours.  To vomit if you eat too soon. Follow these instructions at home: For at least 24 hours after the procedure:   Do not:  Participate in activities where you could fall or become injured.  Drive.  Use heavy machinery.  Drink alcohol.  Take sleeping pills or medicines that cause drowsiness.  Make important decisions or sign legal documents.  Take care of children on your own.  Rest. Eating and drinking  Follow the diet recommended by your health care provider.  If you vomit:  Drink water, juice, or soup when you can drink without vomiting.  Make sure you have little or no nausea before eating solid foods. General instructions  Have a responsible adult stay with you until you are awake and alert.  Take over-the-counter and prescription medicines only as told by your health care provider.  If you smoke, do not smoke without supervision.  Keep all follow-up visits as told by your health care provider. This is important. Contact a health care provider if:  You keep feeling nauseous or you keep vomiting.  You feel light-headed.  You develop a rash.  You have a fever. Get help right away if:  You have trouble breathing. This information is not intended to replace advice given to you by your health care provider. Make sure you discuss any questions you have with your health care  provider. Document Released: 09/29/2013 Document Revised: 05/13/2016 Document Reviewed: 03/30/2016 Elsevier Interactive Patient Education  2017 [employer].

## 2017-02-04 ENCOUNTER — Other Ambulatory Visit: Payer: Self-pay | Admitting: Hematology

## 2017-03-05 ENCOUNTER — Ambulatory Visit (HOSPITAL_BASED_OUTPATIENT_CLINIC_OR_DEPARTMENT_OTHER): Payer: Medicare HMO | Admitting: Hematology

## 2017-03-05 ENCOUNTER — Telehealth: Payer: Self-pay | Admitting: Hematology

## 2017-03-05 ENCOUNTER — Encounter: Payer: Self-pay | Admitting: Hematology

## 2017-03-05 ENCOUNTER — Other Ambulatory Visit (HOSPITAL_BASED_OUTPATIENT_CLINIC_OR_DEPARTMENT_OTHER): Payer: Medicare HMO

## 2017-03-05 VITALS — BP 133/75 | HR 56 | Temp 97.7°F | Resp 18 | Wt 227.3 lb

## 2017-03-05 DIAGNOSIS — D696 Thrombocytopenia, unspecified: Secondary | ICD-10-CM

## 2017-03-05 DIAGNOSIS — C8330 Diffuse large B-cell lymphoma, unspecified site: Secondary | ICD-10-CM

## 2017-03-05 DIAGNOSIS — Z86718 Personal history of other venous thrombosis and embolism: Secondary | ICD-10-CM | POA: Diagnosis not present

## 2017-03-05 DIAGNOSIS — C8338 Diffuse large B-cell lymphoma, lymph nodes of multiple sites: Secondary | ICD-10-CM | POA: Diagnosis not present

## 2017-03-05 LAB — CBC & DIFF AND RETIC
BASO%: 0.5 % (ref 0.0–2.0)
Basophils Absolute: 0 10*3/uL (ref 0.0–0.1)
EOS ABS: 0.1 10*3/uL (ref 0.0–0.5)
EOS%: 1.5 % (ref 0.0–7.0)
HEMATOCRIT: 41.6 % (ref 38.4–49.9)
HEMOGLOBIN: 14.4 g/dL (ref 13.0–17.1)
IMMATURE RETIC FRACT: 1.6 % — AB (ref 3.00–10.60)
LYMPH%: 30.6 % (ref 14.0–49.0)
MCH: 30.5 pg (ref 27.2–33.4)
MCHC: 34.6 g/dL (ref 32.0–36.0)
MCV: 88.1 fL (ref 79.3–98.0)
MONO#: 0.5 10*3/uL (ref 0.1–0.9)
MONO%: 11.9 % (ref 0.0–14.0)
NEUT%: 55.5 % (ref 39.0–75.0)
NEUTROS ABS: 2.2 10*3/uL (ref 1.5–6.5)
Platelets: 114 10*3/uL — ABNORMAL LOW (ref 140–400)
RBC: 4.72 10*6/uL (ref 4.20–5.82)
RDW: 13.8 % (ref 11.0–14.6)
RETIC %: 0.75 % — AB (ref 0.80–1.80)
Retic Ct Abs: 35.4 10*3/uL (ref 34.80–93.90)
WBC: 4 10*3/uL (ref 4.0–10.3)
lymph#: 1.2 10*3/uL (ref 0.9–3.3)

## 2017-03-05 LAB — COMPREHENSIVE METABOLIC PANEL
ALBUMIN: 4.1 g/dL (ref 3.5–5.0)
ALK PHOS: 97 U/L (ref 40–150)
ALT: 28 U/L (ref 0–55)
AST: 27 U/L (ref 5–34)
Anion Gap: 9 mEq/L (ref 3–11)
BILIRUBIN TOTAL: 0.49 mg/dL (ref 0.20–1.20)
BUN: 16.1 mg/dL (ref 7.0–26.0)
CO2: 26 mEq/L (ref 22–29)
CREATININE: 1 mg/dL (ref 0.7–1.3)
Calcium: 9.4 mg/dL (ref 8.4–10.4)
Chloride: 109 mEq/L (ref 98–109)
EGFR: 72 mL/min/{1.73_m2} — ABNORMAL LOW (ref >90)
GLUCOSE: 88 mg/dL (ref 70–140)
Potassium: 4.4 mEq/L (ref 3.5–5.1)
SODIUM: 143 meq/L (ref 136–145)
TOTAL PROTEIN: 6.4 g/dL (ref 6.4–8.3)

## 2017-03-05 LAB — LACTATE DEHYDROGENASE: LDH: 178 U/L (ref 125–245)

## 2017-03-05 NOTE — Telephone Encounter (Signed)
Appointments scheduled per 03/05/17 los. Patient was given a copy of the AVS report and appointment schedule per 03/05/17 los.

## 2017-03-11 NOTE — Progress Notes (Signed)
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 Cytogenetics and Fish showed BCL 2 positivity but negative for BCL 6 and cMYC CD10 positivity suggestive of possibly GCB subtype.  Patient received first cycle of R CHOP on 08/08/2015 and intrathecal methotrexate with hydrocortisone on 08/09/2015. Patient subsequently was admitted with ARDS likely due to neulasta -treated with high dose steroids and empirically with broad spectrum antibiotics.  2nd cycle of chemotherapy delayed to allow for rehabilitation (was due on 08/31/2015).  Received R-mini-CHOP for cycle 2 and tolerated it without significant cytopenias. Was admitted briefly for a mild lower extremity cellulitis and DVT. Cellulitis now resolved. Patient completing his oral antibiotics.  PET/CT scan on 09/29/2015 with no evidence of disease progression. Good response in his mesenteric lymph  nodes. Inguinal lymph nodes on the right side. Decreased uptake in his C5 level.  No new lesions noted.   Cycle 3 (10/02/2015)  of R- CHOP ( we increased the doxorubicin dose back to 50 mg/m and keep the cyclophosphamide dose reduction at 400 mg/m]  Cycle 4 (10/23/2015) of R-CHOP ( we increased the doxorubicin dose back to 50 mg/m and kept the cyclophosphamide dose reduction at 400 mg/m].  PET/CT 11/10/2015: Shows improvement in most lesions but is noted to have a new FDG avid lymph node in the mesentery of the small bowel.  Cycle 5 on 11/13/2015 R CHOP - received full dose.  Cycle 6 on 12/11/2015 R-CHOP full dose.  Admitted with neutropenic fevers and treatment for possible early pneumonia.  This resolved and he was able to discharge home  01/09/2016 through 01/22/2016: The patient was treated to the C4-C6 levels of the spine to a dose of 30 gray in 10 fractions using a 2 field technique. The patient was also treated to an abdominal lymph node region to a dose of 30 gray in 10 fractions using a 3 field technique. This treatment consisted of a 3-D conformal technique with daily image guidance utilized.   Repeat PET CT scan in March 12 2016 -showed some enlarged mesenteric lymph nodes which were FDG avid.  CT guided FNA of mesenteric lymph nodes at [facility] on 03/29/2016 - showed scant cellularity. No overt evidence of lymphoma.  Rpt PET/CT 05/15/2016 at [facility]- show similar appearing enlarged mesenteric lymph nodes which are FDG avid. No change in size and no other evidence of disease progression.  PET/CT 07/24/2016 - and [facility] shows no evidence of disease progression  PET/CT 01/22/2017:  Result Impression   1.Stable low-level metabolic activity in small lymph nodes in the small bowel mesentery. No new FDG avid disease. 2.Persistent hypermetabolic soft tissue thickening overlying the coccyx which could relate to a decubitus ulcer and  should be amenable to direct inspection. 3.Stable pulmonary nodules which do not appear hypermetabolic, however, several are below the size resolution of PET in size. Continued attention on follow-up is suggested. 4.Previously seen hypermetabolic skin thickening near the right ear has resolved. 5.Ancillary CT findings as above.     INTERVAL HISTORY:  Mr. Musgrave is here for his scheduled 3 month follow-up for diffuse large B-cell lymphoma. He notes no acute new symptoms. No fevers no chills no night sweats no weight loss.  He notes that he is doing well as playing golf ,Working in the garden, traveling and trying to stay physically fit and active and has been eating well. He notes he is now truly enjoying retirement and has been spending time with his friends and family. PET/CT schedule with Dr Cassell Clement at [facility] on 01/22/2017 showed no evidence of new FDG avid disease and no progression of small bowel small mesenteric lymph nodes.  REVIEW OF SYSTEMS:   10 point review of systems was done and is negative except as noted above  I have reviewed the past medical history, past surgical history, social history and family history with the patient and they are unchanged from previous note.  ALLERGIES:  is allergic to neulasta [pegfilgrastim].  MEDICATIONS:  Current Outpatient Prescriptions  Medication Sig Dispense Refill  . levothyroxine (SYNTHROID, LEVOTHROID) 75 MCG tablet     . Multiple Vitamin (MULTIVITAMIN WITH MINERALS) TABS tablet Take 1 tablet by mouth daily. Reported on 02/19/2016    . SENNA CO by Combination route.    . simvastatin (ZOCOR) 20 MG tablet Take 20 mg by mouth daily.    . temazepam (RESTORIL) 15 MG capsule Take 15 mg by mouth at bedtime and may repeat dose one time if needed.     No current facility-administered medications for this visit.     PHYSICAL EXAMINATION: ECOG PERFORMANCE STATUS: 1 - Symptomatic but completely ambulatory  Vitals:   03/05/17 0929  BP:  133/75  Pulse: (!) 56  Resp: 18  Temp: 97.7 F (36.5 C)   Filed Weights   03/05/17 0929  Weight: 227 lb 4.8 oz (103.1 kg)    GENERAL:alert, no distress and comfortable SKIN: no acute rashes. EYES: normal, Conjunctiva are pink and non-injected, sclera clear OROPHARYNX:no exudate, no erythema and lips, buccal mucosa, and tongue normal  NECK: supple, thyroid normal size, non-tender, without nodularity LYMPH:no palpable cervical, axillary or inguinal lymphadenopathy . LUNGS: clear to auscultation and percussion with normal breathing effort HEART: regular rate & rhythm and no murmurs and no lower extremity edema ABDOMEN:abdomen soft, non-tender and normal bowel sounds Musculoskeletal:no cyanosis of digits and no clubbing  NEURO: alert & oriented x 3 with fluent speech.  LABORATORY DATA:   CBC Latest Ref Rng & Units 03/05/2017 02/03/2017 12/05/2016  WBC 4.0 - 10.3 10e3/uL 4.0 3.9(L) 3.7(L)  Hemoglobin 13.0 - 17.1 g/dL 14.4 13.6 13.3  Hematocrit 38.4 - 49.9 % 41.6 38.9(L) 38.8  Platelets 140 - 400 10e3/uL 114(L) 125(L) 123(L)   ANC 300  CMP Latest Ref Rng & Units 03/05/2017 02/03/2017 12/05/2016  Glucose 70 - 140 mg/dl 88 92 84  BUN 7.0 - 26.0 mg/dL 16.1 22(H) 17.1  Creatinine 0.7 - 1.3 mg/dL 1.0 0.94 0.9  Sodium 136 - 145 mEq/L 143 141  141  Potassium 3.5 - 5.1 mEq/L 4.4 4.1 4.5  Chloride 101 - 111 mmol/L - 110 -  CO2 22 - 29 mEq/L _0 Calcium 8.4 - 10.4 mg/dL 9.4 9.1 9.2  Total Protein 6.4 - 8.3 g/dL 6.4 - 6.4  Total Bilirubin 0.20 - 1.20 mg/dL 0.49 - 0.50  Alkaline Phos 40 - 150 U/L 97 - 113  AST 5 - 34 U/L 27 - 31  ALT 0 - 55 U/L 28 - 31   . Lab Results  Component Value Date   LDH 178 03/05/2017     RADIOGRAPHIC STUDIES: I have personally reviewed the radiological images as listed and agreed with the findings in the report. No results found.    ASSESSMENT & PLAN:   1) Diffuse large B-cell lymphoma stage IV AE with involvement of mesenteric, inguinal lymph  nodes and C5 vertebral body with spinal cord impingement  Patient is status post treatment as noted above. 2) history of capillary leak syndrome with ARDS requiring intubation likely due to G-CSF. Plan -Patient has no clinical evidence of lymphoma recurrence/progression at this time.  LDH level is within normal limits. No constitutional symptoms.  -- PET CT scan at [facility] with Dr Cassell Clement in Jan 2018 showed no evidence of disease progression or new disease. -Port has been removed  2) history of right lower extremity distal DVT. Resolved. Completed 6 months of treatment in 02/2016  4) Mild chronic thrombocytopenia- platelets 114 k. monitoring.  Return to clinic with Dr. Irene Limbo in 3 months with repeat labs  I spent 20 minutes counseling the patient face to face. The total time spent in the appointment was 20 minutes and more than 50% was on counseling and direct patient cares.  Sullivan Lone MD [place] [facility]/Oncology Physician [facility]  (Office):       [phone] (Work cell):  [phone] (Fax):           [phone]

## 2017-04-13 DIAGNOSIS — Z23 Encounter for immunization: Secondary | ICD-10-CM | POA: Diagnosis not present

## 2017-04-18 DIAGNOSIS — R69 Illness, unspecified: Secondary | ICD-10-CM | POA: Diagnosis not present

## 2017-04-18 DIAGNOSIS — Z923 Personal history of irradiation: Secondary | ICD-10-CM | POA: Diagnosis not present

## 2017-04-18 DIAGNOSIS — S81819D Laceration without foreign body, unspecified lower leg, subsequent encounter: Secondary | ICD-10-CM | POA: Diagnosis not present

## 2017-04-18 DIAGNOSIS — G4700 Insomnia, unspecified: Secondary | ICD-10-CM | POA: Diagnosis not present

## 2017-04-18 DIAGNOSIS — Z9221 Personal history of antineoplastic chemotherapy: Secondary | ICD-10-CM | POA: Diagnosis not present

## 2017-04-18 DIAGNOSIS — H21562 Pupillary abnormality, left eye: Secondary | ICD-10-CM | POA: Diagnosis not present

## 2017-04-18 DIAGNOSIS — E785 Hyperlipidemia, unspecified: Secondary | ICD-10-CM | POA: Diagnosis not present

## 2017-04-18 DIAGNOSIS — Z6829 Body mass index (BMI) 29.0-29.9, adult: Secondary | ICD-10-CM | POA: Diagnosis not present

## 2017-04-18 DIAGNOSIS — Z0000 Encounter for general adult medical examination without abnormal findings: Secondary | ICD-10-CM | POA: Diagnosis not present

## 2017-04-18 DIAGNOSIS — E039 Hypothyroidism, unspecified: Secondary | ICD-10-CM | POA: Diagnosis not present

## 2017-04-18 DIAGNOSIS — Z8572 Personal history of non-Hodgkin lymphomas: Secondary | ICD-10-CM | POA: Diagnosis not present

## 2017-04-18 DIAGNOSIS — Z79899 Other long term (current) drug therapy: Secondary | ICD-10-CM | POA: Diagnosis not present

## 2017-04-23 DIAGNOSIS — R609 Edema, unspecified: Secondary | ICD-10-CM | POA: Diagnosis not present

## 2017-04-23 DIAGNOSIS — T148XXA Other injury of unspecified body region, initial encounter: Secondary | ICD-10-CM | POA: Diagnosis not present

## 2017-06-05 ENCOUNTER — Encounter: Payer: Self-pay | Admitting: Hematology

## 2017-06-05 ENCOUNTER — Telehealth: Payer: Self-pay | Admitting: Hematology

## 2017-06-05 ENCOUNTER — Ambulatory Visit (HOSPITAL_BASED_OUTPATIENT_CLINIC_OR_DEPARTMENT_OTHER): Payer: Medicare HMO | Admitting: Hematology

## 2017-06-05 ENCOUNTER — Other Ambulatory Visit (HOSPITAL_BASED_OUTPATIENT_CLINIC_OR_DEPARTMENT_OTHER): Payer: No Typology Code available for payment source

## 2017-06-05 VITALS — BP 138/77 | HR 64 | Temp 97.9°F | Resp 18 | Ht 73.0 in | Wt 226.0 lb

## 2017-06-05 DIAGNOSIS — C8330 Diffuse large B-cell lymphoma, unspecified site: Secondary | ICD-10-CM

## 2017-06-05 DIAGNOSIS — D696 Thrombocytopenia, unspecified: Secondary | ICD-10-CM | POA: Diagnosis not present

## 2017-06-05 DIAGNOSIS — Z8572 Personal history of non-Hodgkin lymphomas: Secondary | ICD-10-CM | POA: Diagnosis not present

## 2017-06-05 DIAGNOSIS — Z86718 Personal history of other venous thrombosis and embolism: Secondary | ICD-10-CM

## 2017-06-05 LAB — COMPREHENSIVE METABOLIC PANEL
ALBUMIN: 4.1 g/dL (ref 3.5–5.0)
ALK PHOS: 88 U/L (ref 40–150)
ALT: 29 U/L (ref 0–55)
AST: 31 U/L (ref 5–34)
Anion Gap: 9 mEq/L (ref 3–11)
BILIRUBIN TOTAL: 0.62 mg/dL (ref 0.20–1.20)
BUN: 15.1 mg/dL (ref 7.0–26.0)
CALCIUM: 9.6 mg/dL (ref 8.4–10.4)
CO2: 28 mEq/L (ref 22–29)
CREATININE: 1 mg/dL (ref 0.7–1.3)
Chloride: 107 mEq/L (ref 98–109)
EGFR: 75 mL/min/{1.73_m2} — ABNORMAL LOW (ref >90)
GLUCOSE: 86 mg/dL (ref 70–140)
Potassium: 4.5 mEq/L (ref 3.5–5.1)
Sodium: 144 mEq/L (ref 136–145)
TOTAL PROTEIN: 6.5 g/dL (ref 6.4–8.3)

## 2017-06-05 LAB — CBC & DIFF AND RETIC
BASO%: 0.5 % (ref 0.0–2.0)
BASOS ABS: 0 10*3/uL (ref 0.0–0.1)
EOS%: 1.3 % (ref 0.0–7.0)
Eosinophils Absolute: 0.1 10*3/uL (ref 0.0–0.5)
HEMATOCRIT: 41.6 % (ref 38.4–49.9)
HEMOGLOBIN: 14.2 g/dL (ref 13.0–17.1)
IMMATURE RETIC FRACT: 5 % (ref 3.00–10.60)
LYMPH%: 32.3 % (ref 14.0–49.0)
MCH: 30.9 pg (ref 27.2–33.4)
MCHC: 34.1 g/dL (ref 32.0–36.0)
MCV: 90.4 fL (ref 79.3–98.0)
MONO#: 0.4 10*3/uL (ref 0.1–0.9)
MONO%: 10 % (ref 0.0–14.0)
NEUT%: 55.9 % (ref 39.0–75.0)
NEUTROS ABS: 2.1 10*3/uL (ref 1.5–6.5)
Platelets: 125 10*3/uL — ABNORMAL LOW (ref 140–400)
RBC: 4.6 10*6/uL (ref 4.20–5.82)
RDW: 13.9 % (ref 11.0–14.6)
RETIC %: 0.6 % — AB (ref 0.80–1.80)
Retic Ct Abs: 27.6 10*3/uL — ABNORMAL LOW (ref 34.80–93.90)
WBC: 3.8 10*3/uL — ABNORMAL LOW (ref 4.0–10.3)
lymph#: 1.2 10*3/uL (ref 0.9–3.3)

## 2017-06-05 LAB — LACTATE DEHYDROGENASE: LDH: 185 U/L (ref 125–245)

## 2017-06-05 NOTE — Telephone Encounter (Signed)
Scheduled appt per 6/14 los - Gave patient AVS and calender per LOS - lab and f/u in 3 months.

## 2017-06-05 NOTE — Patient Instructions (Signed)
Thank you for choosing [facility] to provide your oncology and hematology care.  To afford each patient quality time with our providers, please arrive 30 minutes before your scheduled appointment time.  If you arrive late for your appointment, you may be asked to reschedule.  We strive to give you quality time with our providers, and arriving late affects you and other patients whose appointments are after yours.  If you are a no show for multiple scheduled visits, you may be dismissed from the clinic at the providers discretion.   Again, thank you for choosing [facility], our hope is that these requests will decrease the amount of time that you wait before being seen by our physicians.  ______________________________________________________________________ Should you have questions after your visit to the [facility], please contact our office at [phone] between the hours of 8:30 and 4:30 p.m.    Voicemails left after 4:30p.m will not be returned until the following business day.   For prescription refill requests, please have your pharmacy contact us directly.  Please also try to allow 48 hours for prescription requests.   Please contact the scheduling department for questions regarding scheduling.  For scheduling of procedures such as PET scans, CT scans, MRI, Ultrasound, etc please contact central scheduling at [phone].   Resources For Cancer Patients and Caregivers:  American Cancer Society:  800-227-2345  Can help patients locate various types of support and financial assistance Cancer Care: 1-800-813-HOPE (4673) Provides financial assistance, online support groups, medication/co-pay assistance.   [place] DSS:  336-641-3447 Where to apply for food stamps, Medicaid, and utility assistance Medicare Rights Center: 800-333-4114 Helps people with Medicare understand their rights and benefits, navigate the Medicare system, and secure the  quality healthcare they deserve SCAT: 336-333-6589 [employer]'s shared-ride transportation service for eligible riders who have a disability that prevents them from riding the fixed route bus.   For additional information on assistance programs please contact our social worker:   Grier Hock/Abigail Elmore:  [phone] 

## 2017-06-11 NOTE — Progress Notes (Signed)
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 Cytogenetics and Fish showed BCL 2 positivity but negative for BCL 6 and cMYC CD10 positivity suggestive of possibly GCB subtype.  Patient received first cycle of R CHOP on 08/08/2015 and intrathecal methotrexate with hydrocortisone on 08/09/2015. Patient subsequently was admitted with ARDS likely due to neulasta -treated with high dose steroids and empirically with broad spectrum antibiotics.  2nd cycle of chemotherapy delayed to allow for rehabilitation (was due on 08/31/2015).  Received R-mini-CHOP for cycle 2 and tolerated it without significant cytopenias. Was admitted briefly for a mild lower extremity cellulitis and DVT. Cellulitis now resolved. Patient completing his oral antibiotics.  PET/CT scan on 09/29/2015 with no evidence of disease progression. Good response in his mesenteric  lymph nodes. Inguinal lymph nodes on the right side. Decreased uptake in his C5 level.  No new lesions noted.   Cycle 3 (10/02/2015)  of R- CHOP ( we increased the doxorubicin dose back to 50 mg/m and keep the cyclophosphamide dose reduction at 400 mg/m]  Cycle 4 (10/23/2015) of R-CHOP ( we increased the doxorubicin dose back to 50 mg/m and kept the cyclophosphamide dose reduction at 400 mg/m].  PET/CT 11/10/2015: Shows improvement in most lesions but is noted to have a new FDG avid lymph node in the mesentery of the small bowel.  Cycle 5 on 11/13/2015 R CHOP - received full dose.  Cycle 6 on 12/11/2015 R-CHOP full dose.  Admitted with neutropenic fevers and treatment for possible early pneumonia.  This resolved and he was able to discharge home  01/09/2016 through 01/22/2016: The patient was treated to the C4-C6 levels of the spine to a dose of 30 gray in 10 fractions using a 2 field technique. The patient was also treated to an abdominal lymph node region to a dose of 30 gray in 10 fractions using a 3 field technique. This treatment consisted of a 3-D conformal technique with daily image guidance utilized.   Repeat PET CT scan in March 12 2016 -showed some enlarged mesenteric lymph nodes which were FDG avid.  CT guided FNA of mesenteric lymph nodes at [facility] on 03/29/2016 - showed scant cellularity. No overt evidence of lymphoma.  Rpt PET/CT 05/15/2016 at [facility]- show similar appearing enlarged mesenteric lymph nodes which are FDG avid. No change in size and no other evidence of disease progression.  PET/CT 07/24/2016 - and [facility] shows no evidence of disease progression  PET/CT 01/22/2017:  Result Impression   1.Stable low-level metabolic activity in small lymph nodes in the small bowel mesentery. No new FDG avid disease. 2.Persistent hypermetabolic soft tissue thickening overlying the coccyx which could relate to a decubitus  ulcer and should be amenable to direct inspection. 3.Stable pulmonary nodules which do not appear hypermetabolic, however, several are below the size resolution of PET in size. Continued attention on follow-up is suggested. 4.Previously seen hypermetabolic skin thickening near the right ear has resolved. 5.Ancillary CT findings as above.     INTERVAL HISTORY:  Ethan Stewart is here for his scheduled 3 month follow-up for diffuse large B-cell lymphoma. He notes no acute new symptoms. No fevers no chills no night sweats no weight loss.  He notes that he is doing well as playing golf ,Working in the garden, traveling and trying to stay physically fit and active and has been eating well. He notes he is in good spirits and is accompanied by his wife. Has not noted any new palpable lymphnodes. No abdominal pain or distension. He notes that his RUE shoulder and arm strength is back to about 90%.  REVIEW OF SYSTEMS:   10 point review of systems was done and is negative except as noted above  I have reviewed the past medical history, past surgical history, social history and family history with the patient and they are unchanged from previous note.  ALLERGIES:  is allergic to neulasta [pegfilgrastim].  MEDICATIONS:  Current Outpatient Prescriptions  Medication Sig Dispense Refill  . levothyroxine (SYNTHROID, LEVOTHROID) 75 MCG tablet     . Multiple Vitamin (MULTIVITAMIN WITH MINERALS) TABS tablet Take 1 tablet by mouth daily. Reported on 02/19/2016    . simvastatin (ZOCOR) 20 MG tablet Take 20 mg by mouth daily.    . temazepam (RESTORIL) 15 MG capsule Take 15 mg by mouth at bedtime and may repeat dose one time if needed.     No current facility-administered medications for this visit.     PHYSICAL EXAMINATION: ECOG PERFORMANCE STATUS: 1 - Symptomatic but completely ambulatory  Vitals:   06/05/17 0936  BP: 138/77  Pulse: 64  Resp: 18  Temp: 97.9 F (36.6 C)   Filed Weights    06/05/17 0936  Weight: 226 lb (102.5 kg)    GENERAL:alert, no distress and comfortable SKIN: no acute rashes. EYES: normal, Conjunctiva are pink and non-injected, sclera clear OROPHARYNX:no exudate, no erythema and lips, buccal mucosa, and tongue normal  NECK: supple, thyroid normal size, non-tender, without nodularity LYMPH:no palpable cervical, axillary or inguinal lymphadenopathy . LUNGS: clear to auscultation and percussion with normal breathing effort HEART: regular rate & rhythm and no murmurs and no lower extremity edema ABDOMEN:abdomen soft, non-tender and normal bowel sounds Musculoskeletal:no cyanosis of digits and no clubbing  NEURO: alert & oriented x 3 with fluent speech.  LABORATORY DATA:   CBC Latest Ref Rng & Units 06/05/2017 03/05/2017 02/03/2017  WBC 4.0 - 10.3 10e3/uL 3.8(L) 4.0 3.9(L)  Hemoglobin 13.0 - 17.1 g/dL 14.2 14.4 13.6  Hematocrit 38.4 - 49.9 % 41.6 41.6 38.9(L)  Platelets 140 - 400 10e3/uL 125(L) 114(L) 125(L)   ANC 300  CMP Latest Ref Rng & Units 06/05/2017 03/05/2017 02/03/2017  Glucose 70 - 140 mg/dl 86 88 92  BUN 7.0 - 26.0 mg/dL 15.1 16.1 22(H)  Creatinine 0.7 - 1.3 mg/dL 1.0 1.0 0.94  Sodium 136 - 145 mEq/L 144 143 141  Potassium 3.5 - 5.1 mEq/L 4.5 4.4 4.1  Chloride 101 - 111 mmol/L - -  110  CO2 22 - 29 mEq/L _0 Calcium 8.4 - 10.4 mg/dL 9.6 9.4 9.1  Total Protein 6.4 - 8.3 g/dL 6.5 6.4 -  Total Bilirubin 0.20 - 1.20 mg/dL 0.62 0.49 -  Alkaline Phos 40 - 150 U/L 88 97 -  AST 5 - 34 U/L 31 27 -  ALT 0 - 55 U/L 29 28 -   . Lab Results  Component Value Date   LDH 185 06/05/2017     RADIOGRAPHIC STUDIES: I have personally reviewed the radiological images as listed and agreed with the findings in the report. No results found.    ASSESSMENT & PLAN:   1) Diffuse large B-cell lymphoma stage IV AE with involvement of mesenteric, inguinal lymph nodes and C5 vertebral body with spinal cord impingement  Patient is status post treatment  as noted above. 2) history of capillary leak syndrome with ARDS requiring intubation likely due to G-CSF. Plan -Patient has no clinical evidence of lymphoma recurrence/progression at this time.  LDH level is within normal limits. No constitutional symptoms.  - no indication of addition routine imaging for lymphoma assessment at this time in the absence of concerning labs findings or clinical symptoms.  2) history of right lower extremity distal DVT. Resolved. Completed 6 months of treatment in 02/2016  4) Mild chronic thrombocytopenia- platelets 125 k. monitoring.  Return to clinic with Dr. Irene Limbo in 3 months with repeat labs  I spent 20 minutes counseling the patient face to face. The total time spent in the appointment was 20 minutes and more than 50% was on counseling and direct patient cares.  Sullivan Lone MD [place] Hematology/Oncology Physician [facility]  (Office):       [phone] (Work cell):  [phone] (Fax):           [phone]

## 2017-08-06 DIAGNOSIS — E038 Other specified hypothyroidism: Secondary | ICD-10-CM | POA: Diagnosis not present

## 2017-08-06 DIAGNOSIS — Z125 Encounter for screening for malignant neoplasm of prostate: Secondary | ICD-10-CM | POA: Diagnosis not present

## 2017-08-06 DIAGNOSIS — E7800 Pure hypercholesterolemia, unspecified: Secondary | ICD-10-CM | POA: Diagnosis not present

## 2017-08-13 DIAGNOSIS — E038 Other specified hypothyroidism: Secondary | ICD-10-CM | POA: Diagnosis not present

## 2017-08-13 DIAGNOSIS — Z23 Encounter for immunization: Secondary | ICD-10-CM | POA: Diagnosis not present

## 2017-08-13 DIAGNOSIS — E7800 Pure hypercholesterolemia, unspecified: Secondary | ICD-10-CM | POA: Diagnosis not present

## 2017-08-13 DIAGNOSIS — Z0000 Encounter for general adult medical examination without abnormal findings: Secondary | ICD-10-CM | POA: Diagnosis not present

## 2017-08-13 DIAGNOSIS — Z79899 Other long term (current) drug therapy: Secondary | ICD-10-CM | POA: Diagnosis not present

## 2017-08-13 DIAGNOSIS — G479 Sleep disorder, unspecified: Secondary | ICD-10-CM | POA: Diagnosis not present

## 2017-08-13 DIAGNOSIS — R69 Illness, unspecified: Secondary | ICD-10-CM | POA: Diagnosis not present

## 2017-09-04 ENCOUNTER — Telehealth: Payer: Self-pay | Admitting: Hematology

## 2017-09-04 ENCOUNTER — Other Ambulatory Visit (HOSPITAL_BASED_OUTPATIENT_CLINIC_OR_DEPARTMENT_OTHER): Payer: No Typology Code available for payment source

## 2017-09-04 ENCOUNTER — Ambulatory Visit (HOSPITAL_BASED_OUTPATIENT_CLINIC_OR_DEPARTMENT_OTHER): Payer: Medicare HMO | Admitting: Hematology

## 2017-09-04 ENCOUNTER — Encounter: Payer: Self-pay | Admitting: Hematology

## 2017-09-04 VITALS — BP 134/67 | HR 58 | Temp 97.8°F | Resp 18 | Ht 73.0 in | Wt 222.4 lb

## 2017-09-04 DIAGNOSIS — C8338 Diffuse large B-cell lymphoma, lymph nodes of multiple sites: Secondary | ICD-10-CM

## 2017-09-04 DIAGNOSIS — Z86718 Personal history of other venous thrombosis and embolism: Secondary | ICD-10-CM | POA: Diagnosis not present

## 2017-09-04 DIAGNOSIS — C8330 Diffuse large B-cell lymphoma, unspecified site: Secondary | ICD-10-CM

## 2017-09-04 DIAGNOSIS — D696 Thrombocytopenia, unspecified: Secondary | ICD-10-CM | POA: Diagnosis not present

## 2017-09-04 LAB — CBC & DIFF AND RETIC
BASO%: 0.5 % (ref 0.0–2.0)
Basophils Absolute: 0 10*3/uL (ref 0.0–0.1)
EOS%: 3.8 % (ref 0.0–7.0)
Eosinophils Absolute: 0.2 10*3/uL (ref 0.0–0.5)
HCT: 40.3 % (ref 38.4–49.9)
HGB: 13.8 g/dL (ref 13.0–17.1)
Immature Retic Fract: 0 % — ABNORMAL LOW (ref 3.00–10.60)
LYMPH%: 29.9 % (ref 14.0–49.0)
MCH: 30.9 pg (ref 27.2–33.4)
MCHC: 34.2 g/dL (ref 32.0–36.0)
MCV: 90.4 fL (ref 79.3–98.0)
MONO#: 0.5 10*3/uL (ref 0.1–0.9)
MONO%: 12.5 % (ref 0.0–14.0)
NEUT#: 2.1 10*3/uL (ref 1.5–6.5)
NEUT%: 53.3 % (ref 39.0–75.0)
Platelets: 117 10*3/uL — ABNORMAL LOW (ref 140–400)
RBC: 4.46 10*6/uL (ref 4.20–5.82)
RDW: 13.5 % (ref 11.0–14.6)
Retic %: 0.59 % — ABNORMAL LOW (ref 0.80–1.80)
Retic Ct Abs: 26.31 10*3/uL — ABNORMAL LOW (ref 34.80–93.90)
WBC: 3.9 10*3/uL — AB (ref 4.0–10.3)
lymph#: 1.2 10*3/uL (ref 0.9–3.3)

## 2017-09-04 LAB — COMPREHENSIVE METABOLIC PANEL
ALT: 29 U/L (ref 0–55)
ANION GAP: 8 meq/L (ref 3–11)
AST: 30 U/L (ref 5–34)
Albumin: 4 g/dL (ref 3.5–5.0)
Alkaline Phosphatase: 93 U/L (ref 40–150)
BILIRUBIN TOTAL: 0.65 mg/dL (ref 0.20–1.20)
BUN: 18.2 mg/dL (ref 7.0–26.0)
CHLORIDE: 108 meq/L (ref 98–109)
CO2: 26 meq/L (ref 22–29)
Calcium: 9.3 mg/dL (ref 8.4–10.4)
Creatinine: 1.1 mg/dL (ref 0.7–1.3)
EGFR: 69 mL/min/{1.73_m2} — AB (ref >90)
Glucose: 90 mg/dl (ref 70–140)
POTASSIUM: 4.2 meq/L (ref 3.5–5.1)
Sodium: 142 mEq/L (ref 136–145)
Total Protein: 6.5 g/dL (ref 6.4–8.3)

## 2017-09-04 LAB — LACTATE DEHYDROGENASE: LDH: 208 U/L (ref 125–245)

## 2017-09-04 NOTE — Telephone Encounter (Signed)
Scheduled appt per 9/13 los - Gave patient AVS and calender per los. - lab and f/u in 3 months.
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
## 2017-10-01 DIAGNOSIS — D692 Other nonthrombocytopenic purpura: Secondary | ICD-10-CM | POA: Diagnosis not present

## 2017-10-01 DIAGNOSIS — D2271 Melanocytic nevi of right lower limb, including hip: Secondary | ICD-10-CM | POA: Diagnosis not present

## 2017-10-01 DIAGNOSIS — L918 Other hypertrophic disorders of the skin: Secondary | ICD-10-CM | POA: Diagnosis not present

## 2017-10-01 DIAGNOSIS — L570 Actinic keratosis: Secondary | ICD-10-CM | POA: Diagnosis not present

## 2017-10-01 DIAGNOSIS — L821 Other seborrheic keratosis: Secondary | ICD-10-CM | POA: Diagnosis not present

## 2017-10-01 DIAGNOSIS — L578 Other skin changes due to chronic exposure to nonionizing radiation: Secondary | ICD-10-CM | POA: Diagnosis not present

## 2017-10-01 DIAGNOSIS — D225 Melanocytic nevi of trunk: Secondary | ICD-10-CM | POA: Diagnosis not present

## 2017-10-01 DIAGNOSIS — Z85828 Personal history of other malignant neoplasm of skin: Secondary | ICD-10-CM | POA: Diagnosis not present

## 2017-12-04 ENCOUNTER — Other Ambulatory Visit (HOSPITAL_BASED_OUTPATIENT_CLINIC_OR_DEPARTMENT_OTHER): Payer: Medicare HMO

## 2017-12-04 ENCOUNTER — Ambulatory Visit (HOSPITAL_BASED_OUTPATIENT_CLINIC_OR_DEPARTMENT_OTHER): Payer: Medicare HMO | Admitting: Hematology

## 2017-12-04 ENCOUNTER — Telehealth: Payer: Self-pay | Admitting: Hematology

## 2017-12-04 VITALS — BP 134/68 | HR 54 | Temp 97.7°F | Resp 18 | Ht 73.0 in | Wt 227.0 lb

## 2017-12-04 DIAGNOSIS — C8330 Diffuse large B-cell lymphoma, unspecified site: Secondary | ICD-10-CM

## 2017-12-04 DIAGNOSIS — Z86718 Personal history of other venous thrombosis and embolism: Secondary | ICD-10-CM

## 2017-12-04 DIAGNOSIS — Z8572 Personal history of non-Hodgkin lymphomas: Secondary | ICD-10-CM

## 2017-12-04 DIAGNOSIS — D696 Thrombocytopenia, unspecified: Secondary | ICD-10-CM | POA: Diagnosis not present

## 2017-12-04 LAB — CBC & DIFF AND RETIC
BASO%: 0.5 % (ref 0.0–2.0)
Basophils Absolute: 0 10*3/uL (ref 0.0–0.1)
EOS ABS: 0.1 10*3/uL (ref 0.0–0.5)
EOS%: 2.6 % (ref 0.0–7.0)
HCT: 41 % (ref 38.4–49.9)
HEMOGLOBIN: 14.1 g/dL (ref 13.0–17.1)
Immature Retic Fract: 2 % — ABNORMAL LOW (ref 3.00–10.60)
LYMPH%: 32.2 % (ref 14.0–49.0)
MCH: 30.9 pg (ref 27.2–33.4)
MCHC: 34.4 g/dL (ref 32.0–36.0)
MCV: 89.9 fL (ref 79.3–98.0)
MONO#: 0.5 10*3/uL (ref 0.1–0.9)
MONO%: 11 % (ref 0.0–14.0)
NEUT%: 53.7 % (ref 39.0–75.0)
NEUTROS ABS: 2.3 10*3/uL (ref 1.5–6.5)
Platelets: 137 10*3/uL — ABNORMAL LOW (ref 140–400)
RBC: 4.56 10*6/uL (ref 4.20–5.82)
RDW: 13.4 % (ref 11.0–14.6)
RETIC %: 0.75 % — AB (ref 0.80–1.80)
Retic Ct Abs: 34.2 10*3/uL — ABNORMAL LOW (ref 34.80–93.90)
WBC: 4.2 10*3/uL (ref 4.0–10.3)
lymph#: 1.4 10*3/uL (ref 0.9–3.3)

## 2017-12-04 LAB — COMPREHENSIVE METABOLIC PANEL
ALK PHOS: 88 U/L (ref 40–150)
ALT: 34 U/L (ref 0–55)
ANION GAP: 9 meq/L (ref 3–11)
AST: 29 U/L (ref 5–34)
Albumin: 4 g/dL (ref 3.5–5.0)
BUN: 18.4 mg/dL (ref 7.0–26.0)
CHLORIDE: 108 meq/L (ref 98–109)
CO2: 23 mEq/L (ref 22–29)
CREATININE: 1 mg/dL (ref 0.7–1.3)
Calcium: 9 mg/dL (ref 8.4–10.4)
EGFR: >60 mL/min/{1.73_m2} (ref >60)
Glucose: 86 mg/dl (ref 70–140)
POTASSIUM: 4.3 meq/L (ref 3.5–5.1)
Sodium: 140 mEq/L (ref 136–145)
Total Bilirubin: 0.53 mg/dL (ref 0.20–1.20)
Total Protein: 6.3 g/dL — ABNORMAL LOW (ref 6.4–8.3)

## 2017-12-04 LAB — LACTATE DEHYDROGENASE: LDH: 194 U/L (ref 125–245)

## 2017-12-04 NOTE — Telephone Encounter (Signed)
Scheduled appt per 12/13 los - Gave patient AVS and calender per los.  

## 2017-12-04 NOTE — Patient Instructions (Signed)
Thank you for choosing [facility] to provide your oncology and hematology care.  To afford each patient quality time with our providers, please arrive 30 minutes before your scheduled appointment time.  If you arrive late for your appointment, you may be asked to reschedule.  We strive to give you quality time with our providers, and arriving late affects you and other patients whose appointments are after yours.   If you are a no show for multiple scheduled visits, you may be dismissed from the clinic at the providers discretion.    Again, thank you for choosing [facility], our hope is that these requests will decrease the amount of time that you wait before being seen by our physicians.  ______________________________________________________________________  Should you have questions after your visit to the [facility], please contact our office at [phone] between the hours of 8:30 and 4:30 p.m.    Voicemails left after 4:30p.m will not be returned until the following business day.    For prescription refill requests, please have your pharmacy contact us directly.  Please also try to allow 48 hours for prescription requests.    Please contact the scheduling department for questions regarding scheduling.  For scheduling of procedures such as PET scans, CT scans, MRI, Ultrasound, etc please contact central scheduling at [phone].    Resources For Cancer Patients and Caregivers:   Oncolink.org:  A wonderful resource for patients and healthcare providers for information regarding your disease, ways to tract your treatment, what to expect, etc.     American Cancer Society:  800-227-2345  Can help patients locate various types of support and financial assistance  Cancer Care: 1-800-813-HOPE (4673) Provides financial assistance, online support groups, medication/co-pay assistance.    [place] DSS:  336-641-3447 Where to apply for food  stamps, Medicaid, and utility assistance  Medicare Rights Center: 800-333-4114 Helps people with Medicare understand their rights and benefits, navigate the Medicare system, and secure the quality healthcare they deserve  SCAT: 336-333-6589 [place] [employer]'s shared-ride transportation service for eligible riders who have a disability that prevents them from riding the fixed route bus.    For additional information on assistance programs please contact our social worker:   Grier Hock/Abigail Elmore:  [phone]            

## 2017-12-22 NOTE — Progress Notes (Signed)
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 Cytogenetics and Fish showed BCL 2 positivity but negative for BCL 6 and cMYC CD10 positivity suggestive of possibly GCB subtype.  Patient received first cycle of R CHOP on 08/08/2015 and intrathecal methotrexate with hydrocortisone on 08/09/2015. Patient subsequently was admitted with ARDS likely due to neulasta -treated with high dose steroids and empirically with broad spectrum antibiotics.  2nd cycle of chemotherapy delayed to allow for rehabilitation (was due on 08/31/2015).  Received R-mini-CHOP for cycle 2 and tolerated it without significant cytopenias. Was admitted briefly for a mild lower extremity cellulitis and DVT. Cellulitis now resolved. Patient completing his oral antibiotics.  PET/CT scan on 09/29/2015 with no evidence of disease progression. Good response in his mesenteric  lymph nodes. Inguinal lymph nodes on the right side. Decreased uptake in his C5 level.  No new lesions noted.   Cycle 3 (10/02/2015)  of R- CHOP ( we increased the doxorubicin dose back to 50 mg/m and keep the cyclophosphamide dose reduction at 400 mg/m]  Cycle 4 (10/23/2015) of R-CHOP ( we increased the doxorubicin dose back to 50 mg/m and kept the cyclophosphamide dose reduction at 400 mg/m].  PET/CT 11/10/2015: Shows improvement in most lesions but is noted to have a new FDG avid lymph node in the mesentery of the small bowel.  Cycle 5 on 11/13/2015 R CHOP - received full dose.  Cycle 6 on 12/11/2015 R-CHOP full dose.  Admitted with neutropenic fevers and treatment for possible early pneumonia.  This resolved and he was able to discharge home  01/09/2016 through 01/22/2016: The patient was treated to the C4-C6 levels of the spine to a dose of 30 gray in 10 fractions using a 2 field technique. The patient was also treated to an abdominal lymph node region to a dose of 30 gray in 10 fractions using a 3 field technique. This treatment consisted of a 3-D conformal technique with daily image guidance utilized.   Repeat PET CT scan in March 12 2016 -showed some enlarged mesenteric lymph nodes which were FDG avid.  CT guided FNA of mesenteric lymph nodes at [facility] on 03/29/2016 - showed scant cellularity. No overt evidence of lymphoma.  Rpt PET/CT 05/15/2016 at [facility]- show similar appearing enlarged mesenteric lymph nodes which are FDG avid. No change in size and no other evidence of disease progression.  PET/CT 07/24/2016 - and [facility] shows no evidence of disease progression  PET/CT  01/22/2017:  Result Impression   1.Stable low-level metabolic activity in small lymph nodes in the small bowel mesentery. No new FDG avid disease. 2.Persistent hypermetabolic soft tissue thickening overlying the coccyx which could relate to a decubitus  ulcer and should be amenable to direct inspection. 3.Stable pulmonary nodules which do not appear hypermetabolic, however, several are below the size resolution of PET in size. Continued attention on follow-up is suggested. 4.Previously seen hypermetabolic skin thickening near the right ear has resolved. 5.Ancillary CT findings as above.    INTERVAL HISTORY:  Ethan Stewart is here for his scheduled 3 month follow-up for diffuse large B-cell lymphoma. He is now about 2 yrs out from completion of his treatment. He notes no acute new symptoms. No fevers no chills no night sweats no weight loss. Overall he has been doing very well. He has not noticed any new palpable nodes. He has continued to eat well and maintain good nutrition.   REVIEW OF SYSTEMS:   10 point review of systems was done and is negative except as noted above  I have reviewed the past medical history, past surgical history, social history and family history with the patient and they are unchanged from previous note.  ALLERGIES:  is allergic to neulasta [pegfilgrastim].  MEDICATIONS:  Current Outpatient Medications  Medication Sig Dispense Refill  . levothyroxine (SYNTHROID, LEVOTHROID) 75 MCG tablet     . Multiple Vitamin (MULTIVITAMIN WITH MINERALS) TABS tablet Take 1 tablet by mouth daily. Reported on 02/19/2016    . simvastatin (ZOCOR) 20 MG tablet Take 20 mg by mouth daily.    . temazepam (RESTORIL) 15 MG capsule Take 15 mg by mouth at bedtime and may repeat dose one time if needed.     No current facility-administered medications for this visit.     PHYSICAL EXAMINATION: ECOG PERFORMANCE STATUS: 1 - Symptomatic but completely ambulatory  Vitals:   12/04/17 0847  BP: 134/68  Pulse: (!) 54  Resp: 18  Temp: 97.7 F (36.5 C)  SpO2: 100%   Filed Weights   12/04/17 0847  Weight: 227 lb (103 kg)    GENERAL:alert, no distress and comfortable SKIN: no acute rashes. EYES: normal, Conjunctiva are pink and  non-injected, sclera clear OROPHARYNX:no exudate, no erythema and lips, buccal mucosa, and tongue normal  NECK: supple, thyroid normal size, non-tender, without nodularity LYMPH:no palpable cervical, axillary or inguinal lymphadenopathy LUNGS: clear to auscultation and percussion with normal breathing effort HEART: regular rate & rhythm and no murmurs and no lower extremity edema ABDOMEN:abdomen soft, non-tender and normal bowel sounds. No palpable hepatosplenomegaly.  Musculoskeletal:no cyanosis of digits and no clubbing  NEURO: alert & oriented x 3 with fluent speech.  LABORATORY DATA:   CBC Latest Ref Rng & Units 12/04/2017 09/04/2017 06/05/2017  WBC 4.0 - 10.3 10e3/uL 4.2 3.9(L) 3.8(L)  Hemoglobin 13.0 - 17.1 g/dL 14.1 13.8 14.2  Hematocrit 38.4 - 49.9 % 41.0 40.3 41.6  Platelets 140 - 400 10e3/uL 137(L) 117(L) 125(L)   ANC 300  CMP Latest Ref Rng & Units 12/04/2017 09/04/2017 06/05/2017  Glucose 70 - 140 mg/dl 86 90 86  BUN 7.0 - 26.0 mg/dL 18.4 18.2 15.1  Creatinine 0.7 - 1.3 mg/dL 1.0 1.1 1.0  Sodium 136 - 145 mEq/L 140 142 144  Potassium 3.5 - 5.1 mEq/L 4.3 4.2 4.5  Chloride 101 - 111 mmol/L - - -  CO2 22 - 29 mEq/L '23 26 28  ' Calcium 8.4 - 10.4 mg/dL 9.0 9.3 9.6  Total Protein  6.4 - 8.3 g/dL 6.3(L) 6.5 6.5  Total Bilirubin 0.20 - 1.20 mg/dL 0.53 0.65 0.62  Alkaline Phos 40 - 150 U/L 88 93 88  AST 5 - 34 U/L '29 30 31  ' ALT 0 - 55 U/L 34 29 29   . Lab Results  Component Value Date   LDH 194 12/04/2017     RADIOGRAPHIC STUDIES: I have personally reviewed the radiological images as listed and agreed with the findings in the report. No results found.    ASSESSMENT & PLAN:   1) Diffuse large B-cell lymphoma stage IV AE with involvement of mesenteric, inguinal lymph nodes and C5 vertebral body with spinal cord impingement  Patient is status post treatment as noted above. 2) history of capillary leak syndrome with ARDS requiring intubation likely due to  G-CSF. Plan -Patient has no clinical evidence of lymphoma recurrence/progression at this time.  LDH level is within normal limits. No constitutional symptoms.  - no indication of  routine imaging for lymphoma reassessment at this time in the absence of concerning labs findings or clinical symptoms. -Labs remain stable; LDH WNL -patient is now about 72yr post completion of treatment and we shall switch to [id number] at this time.  2) history of right lower extremity distal DVT. Resolved. Completed 6 months of treatment in 02/2016  3) Mild chronic thrombocytopenia- platelets 137K, we will be monitoring.  Return to clinic with Dr. KaIrene Limbon 6 months with repeat labs  I spent 20 minutes counseling the patient face to face. The total time spent in the appointment was 20 minutes and more than 50% was on counseling and direct patient cares.  GaSullivan LoneD MSDuncannonematology/Oncology Physician CoSouth [facility](Office):       [phone] cell):  [phone]):           [phone]

## 2018-01-07 DIAGNOSIS — G4700 Insomnia, unspecified: Secondary | ICD-10-CM | POA: Diagnosis not present

## 2018-01-07 DIAGNOSIS — Z803 Family history of malignant neoplasm of breast: Secondary | ICD-10-CM | POA: Diagnosis not present

## 2018-01-07 DIAGNOSIS — E039 Hypothyroidism, unspecified: Secondary | ICD-10-CM | POA: Diagnosis not present

## 2018-01-07 DIAGNOSIS — R69 Illness, unspecified: Secondary | ICD-10-CM | POA: Diagnosis not present

## 2018-01-07 DIAGNOSIS — E785 Hyperlipidemia, unspecified: Secondary | ICD-10-CM | POA: Diagnosis not present

## 2018-01-07 DIAGNOSIS — C8590 Non-Hodgkin lymphoma, unspecified, unspecified site: Secondary | ICD-10-CM | POA: Diagnosis not present

## 2018-01-07 DIAGNOSIS — F419 Anxiety disorder, unspecified: Secondary | ICD-10-CM | POA: Diagnosis not present

## 2018-01-07 DIAGNOSIS — E669 Obesity, unspecified: Secondary | ICD-10-CM | POA: Diagnosis not present

## 2018-01-07 DIAGNOSIS — Z809 Family history of malignant neoplasm, unspecified: Secondary | ICD-10-CM | POA: Diagnosis not present

## 2018-01-07 DIAGNOSIS — Z8249 Family history of ischemic heart disease and other diseases of the circulatory system: Secondary | ICD-10-CM | POA: Diagnosis not present

## 2018-02-05 DIAGNOSIS — Z0000 Encounter for general adult medical examination without abnormal findings: Secondary | ICD-10-CM | POA: Diagnosis not present

## 2018-02-05 DIAGNOSIS — R69 Illness, unspecified: Secondary | ICD-10-CM | POA: Diagnosis not present

## 2018-02-05 DIAGNOSIS — E7800 Pure hypercholesterolemia, unspecified: Secondary | ICD-10-CM | POA: Diagnosis not present

## 2018-02-05 DIAGNOSIS — Z23 Encounter for immunization: Secondary | ICD-10-CM | POA: Diagnosis not present

## 2018-02-05 DIAGNOSIS — G479 Sleep disorder, unspecified: Secondary | ICD-10-CM | POA: Diagnosis not present

## 2018-02-05 DIAGNOSIS — Z79899 Other long term (current) drug therapy: Secondary | ICD-10-CM | POA: Diagnosis not present

## 2018-02-05 DIAGNOSIS — E038 Other specified hypothyroidism: Secondary | ICD-10-CM | POA: Diagnosis not present

## 2018-02-13 DIAGNOSIS — G479 Sleep disorder, unspecified: Secondary | ICD-10-CM | POA: Diagnosis not present

## 2018-02-13 DIAGNOSIS — E038 Other specified hypothyroidism: Secondary | ICD-10-CM | POA: Diagnosis not present

## 2018-02-13 DIAGNOSIS — R69 Illness, unspecified: Secondary | ICD-10-CM | POA: Diagnosis not present

## 2018-03-23 DIAGNOSIS — R69 Illness, unspecified: Secondary | ICD-10-CM | POA: Diagnosis not present

## 2018-03-31 ENCOUNTER — Telehealth: Payer: Self-pay

## 2018-03-31 NOTE — Telephone Encounter (Signed)
Pt currently seeing Dr. Jabier Mutton regarding cataract surgery. MD requested last 3 OV notes in order to proceed with surgery. Explained to pt that we typically require information from the office in order to confirm that all requirements are properly answered and signed by Dr. Irene Limbo. Pt expressed that Dr. Jabier Mutton only required last three OV notes to be sent. Fax number provided [phone]. Confirmed fax receipt 03/31/18 at 1141.

## 2018-03-31 NOTE — Telephone Encounter (Signed)
Duplicate encounter

## 2018-06-03 NOTE — Progress Notes (Signed)
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 Cytogenetics and Fish showed BCL 2 positivity but negative for BCL 6 and cMYC CD10 positivity suggestive of possibly GCB subtype.  Patient received first cycle of R CHOP on 08/08/2015 and intrathecal methotrexate with hydrocortisone on 08/09/2015. Patient subsequently was admitted with ARDS likely due to neulasta -treated with high dose steroids and empirically with broad spectrum antibiotics.  2nd cycle of chemotherapy delayed to allow for rehabilitation (was due on 08/31/2015).  Received R-mini-CHOP for cycle 2 and tolerated it without significant cytopenias. Was admitted briefly for a mild lower extremity cellulitis and DVT. Cellulitis now resolved. Patient completing his oral antibiotics.  PET/CT scan on 09/29/2015 with no evidence of disease progression. Good response in his mesenteric lymph  nodes. Inguinal lymph nodes on the right side. Decreased uptake in his C5 level.  No new lesions noted.   Cycle 3 (10/02/2015)  of R- CHOP ( we increased the doxorubicin dose back to 50 mg/m and keep the cyclophosphamide dose reduction at 400 mg/m]  Cycle 4 (10/23/2015) of R-CHOP ( we increased the doxorubicin dose back to 50 mg/m and kept the cyclophosphamide dose reduction at 400 mg/m].  PET/CT 11/10/2015: Shows improvement in most lesions but is noted to have a new FDG avid lymph node in the mesentery of the small bowel.  Cycle 5 on 11/13/2015 R CHOP - received full dose.  Cycle 6 on 12/11/2015 R-CHOP full dose.  Admitted with neutropenic fevers and treatment for possible early pneumonia.  This resolved and he was able to discharge home  01/09/2016 through 01/22/2016: The patient was treated to the C4-C6 levels of the spine to a dose of 30 gray in 10 fractions using a 2 field technique. The patient was also treated to an abdominal lymph node region to a dose of 30 gray in 10 fractions using a 3 field technique. This treatment consisted of a 3-D conformal technique with daily image guidance utilized.   Repeat PET CT scan in March 12 2016 -showed some enlarged mesenteric lymph nodes which were FDG avid.  CT guided FNA of mesenteric lymph nodes at [facility] on 03/29/2016 - showed scant cellularity. No overt evidence of lymphoma.  Rpt PET/CT 05/15/2016 at [facility]- show similar appearing enlarged mesenteric lymph nodes which are FDG avid. No change in size and no other evidence of disease progression.  PET/CT 07/24/2016 - and [facility] shows no evidence of disease progression  PET/CT 01/22/2017:  Result Impression   1.Stable low-level metabolic activity in small lymph nodes in the small bowel mesentery. No new FDG avid disease. 2.Persistent hypermetabolic soft tissue thickening overlying the coccyx which could relate to a decubitus ulcer and  should be amenable to direct inspection. 3.Stable pulmonary nodules which do not appear hypermetabolic, however, several are below the size resolution of PET in size. Continued attention on follow-up is suggested. 4.Previously seen hypermetabolic skin thickening near the right ear has resolved. 5.Ancillary CT findings as above.    INTERVAL HISTORY:  [name] is here for his scheduled 6 month follow-up for diffuse large B-cell lymphoma. The patient's last visit with [place] was on 12/04/17. He is accompanied today by his wife. The pt reports that he is doing well overall.   The pt reports that he has developed a small skin nodule on his abdomen, and denies any subcutaneous shots in the area. He denies it changing in the past few weeks. His wife noticed this spot before the pt did, and he denies any pain associated to the area. He is unsure if he has run into something. He denies any other lumps, bumps, or rashes.   He also notes that he has been walking 4-5 miles each day, routinely lifts weights, and is very active in general. He denies any neck symptoms or weakness in his arms.   Lab results today (06/04/18) of CBC, CMP, and Reticulocytes is as follows: all values are WNL except for WBC at 3.8k, Platelets at 117k, Retic ct pct at 0.6%, Retic ct abs at 28.8k. LDH 06/04/18 is WNL at 200  On review of systems, pt reports abdominal skin nodule, good energy levels, and denies arm weakness, arm pain, neck pain, chest pain, SOB, changes in bowel habits, changes in urinary habits, fevers, chills, night sweats, skin rashes, abdominal pains, leg swelling, and any other symptoms.   REVIEW OF SYSTEMS:   A 10+ POINT REVIEW OF SYSTEMS WAS OBTAINED including neurology, dermatology, psychiatry, cardiac, respiratory, lymph, extremities, GI, GU, Musculoskeletal, constitutional, breasts, reproductive, HEENT.  All pertinent positives are noted in the HPI.  All others are negative.   ALLERGIES:  is allergic to  neulasta [pegfilgrastim].  MEDICATIONS:  Current Outpatient Medications  Medication Sig Dispense Refill  . levothyroxine (SYNTHROID, LEVOTHROID) 75 MCG tablet     . Multiple Vitamin (MULTIVITAMIN WITH MINERALS) TABS tablet Take 1 tablet by mouth daily. Reported on 02/19/2016    . simvastatin (ZOCOR) 20 MG tablet Take 20 mg by mouth daily.    . temazepam (RESTORIL) 15 MG capsule Take 15 mg by mouth at bedtime and may repeat dose one time if needed.     No current facility-administered medications for this visit.     PHYSICAL EXAMINATION: ECOG PERFORMANCE STATUS: 1 - Symptomatic but completely ambulatory  Vitals:   06/04/18 1217  BP: (!) 146/79  Pulse: (!) 52  Resp: 18  Temp: 97.8 F (36.6 C)  SpO2: 100%   Filed Weights   06/04/18 1217  Weight: 223 lb 9.6 oz (101.4 kg)    GENERAL:alert, in no acute distress and comfortable SKIN: no acute rashes, no significant lesions EYES: conjunctiva are pink and non-injected, sclera anicteric OROPHARYNX: MMM, no exudates, no oropharyngeal erythema or ulceration NECK: supple, no JVD LYMPH:  no palpable lymphadenopathy in the cervical, axillary or inguinal regions LUNGS: clear to auscultation b/l with normal respiratory effort HEART: regular rate & rhythm ABDOMEN:  normoactive bowel sounds , non tender, not distended. No palpable hepatosplenomegaly.  Extremity: no pedal edema PSYCH: alert & oriented x 3 with fluent speech NEURO: no focal motor/sensory deficits   LABORATORY DATA:   CBC Latest Ref Rng & Units 06/04/2018 12/04/2017 09/04/2017  WBC 4.0 - 10.3 K/uL 3.8(L) 4.2 3.9(L)  Hemoglobin 13.0 - 17.1 g/dL 14.8 14.1 13.8  Hematocrit 38.4 - 49.9 % 42.8 41.0 40.3  Platelets 140 - 400 K/uL 117(L) 137(L) 117(L)   ANC 300  CMP Latest Ref Rng & Units 06/04/2018 12/04/2017 09/04/2017  Glucose 70 - 140 mg/dL 88 86 90  BUN 7 - 26 mg/dL 17 18.4 18.2  Creatinine 0.70 - 1.30 mg/dL 1.05 1.0 1.1  Sodium 136 - 145 mmol/L 140 140 142  Potassium 3.5  - 5.1 mmol/L 4.2 4.3 4.2  Chloride 98 - 109 mmol/L 105 - -  CO2 22 - 29 mmol/L _0 Calcium 8.4 - 10.4 mg/dL 9.4 9.0 9.3  Total Protein 6.4 - 8.3 g/dL 6.7 6.3(L) 6.5  Total Bilirubin 0.2 - 1.2 mg/dL 0.5 0.53 0.65  Alkaline Phos 40 - 150 U/L 93 88 93  AST 5 - 34 U/L 32 29 30  ALT 0 - 55 U/L 27 34 29   . Lab Results  Component Value Date   LDH 200 06/04/2018     RADIOGRAPHIC STUDIES: I have personally reviewed the radiological images as listed and agreed with the findings in the report. No results found.    ASSESSMENT & PLAN:   1) Diffuse large B-cell lymphoma stage IV AE with involvement of mesenteric, inguinal lymph nodes and C5 vertebral body with spinal cord impingement  Patient is status post treatment as noted above. 2) history of capillary leak syndrome with ARDS requiring intubation likely due to G-CSF. Plan  -Discussed pt labwork today, 06/04/18; LDH is normal at 200. Blood counts and chemistries are stable.  -If abdominal skin nodule changes in any way: grows, changes colors, becomes painful, the pt will let us or his PCP know. Appears to be small muscular hematoma- likely  -Abdominal nodule palpates as a strained muscle -The pt shows no clinical or lab progression of lymphoma at this time.  -No indication for further treatment at this time.   -Will see pt back in 6 months   2) history of right lower extremity distal DVT. Resolved. Completed 6 months of treatment in 02/2016 -not currently on anticoagulation.  3) Mild chronic thrombocytopenia- platelets 137K, we will be monitoring.   RTC with Dr Irene Limbo in 6 months with labs   The toal time spent in the appt was 20 minutes and more than 50% was on counseling and direct patient cares.   Sullivan Lone MD MS Hematology/Oncology Physician [facility]  (Office):       [phone] (Work cell):  [phone] (Fax):           [phone]  I, Baldwin Jamaica, am acting as a scribe for Dr Irene Limbo.   .I  have reviewed the above documentation for accuracy and completeness, and I agree with the above. Brunetta Genera MD

## 2018-06-04 ENCOUNTER — Inpatient Hospital Stay: Payer: Medicare HMO

## 2018-06-04 ENCOUNTER — Telehealth: Payer: Self-pay | Admitting: Hematology

## 2018-06-04 ENCOUNTER — Inpatient Hospital Stay: Payer: Medicare HMO | Attending: Hematology | Admitting: Hematology

## 2018-06-04 VITALS — BP 146/79 | HR 52 | Temp 97.8°F | Resp 18 | Ht 73.0 in | Wt 223.6 lb

## 2018-06-04 DIAGNOSIS — D696 Thrombocytopenia, unspecified: Secondary | ICD-10-CM | POA: Insufficient documentation

## 2018-06-04 DIAGNOSIS — Z8572 Personal history of non-Hodgkin lymphomas: Secondary | ICD-10-CM | POA: Diagnosis not present

## 2018-06-04 DIAGNOSIS — Z86718 Personal history of other venous thrombosis and embolism: Secondary | ICD-10-CM | POA: Diagnosis not present

## 2018-06-04 DIAGNOSIS — R222 Localized swelling, mass and lump, trunk: Secondary | ICD-10-CM | POA: Diagnosis not present

## 2018-06-04 DIAGNOSIS — C8330 Diffuse large B-cell lymphoma, unspecified site: Secondary | ICD-10-CM

## 2018-06-04 LAB — COMPREHENSIVE METABOLIC PANEL
ALT: 27 U/L (ref 0–55)
AST: 32 U/L (ref 5–34)
Albumin: 4.3 g/dL (ref 3.5–5.0)
Alkaline Phosphatase: 93 U/L (ref 40–150)
Anion gap: 8 (ref 3–11)
BUN: 17 mg/dL (ref 7–26)
CHLORIDE: 105 mmol/L (ref 98–109)
CO2: 27 mmol/L (ref 22–29)
CREATININE: 1.05 mg/dL (ref 0.70–1.30)
Calcium: 9.4 mg/dL (ref 8.4–10.4)
GFR calc Af Amer: >60 mL/min (ref >60)
Glucose, Bld: 88 mg/dL (ref 70–140)
POTASSIUM: 4.2 mmol/L (ref 3.5–5.1)
SODIUM: 140 mmol/L (ref 136–145)
Total Bilirubin: 0.5 mg/dL (ref 0.2–1.2)
Total Protein: 6.7 g/dL (ref 6.4–8.3)

## 2018-06-04 LAB — CBC WITH DIFFERENTIAL (CANCER CENTER ONLY)
Basophils Absolute: 0 10*3/uL (ref 0.0–0.1)
Basophils Relative: 1 %
EOS PCT: 2 %
Eosinophils Absolute: 0.1 10*3/uL (ref 0.0–0.5)
HCT: 42.8 % (ref 38.4–49.9)
HEMOGLOBIN: 14.8 g/dL (ref 13.0–17.1)
LYMPHS ABS: 1.3 10*3/uL (ref 0.9–3.3)
LYMPHS PCT: 35 %
MCH: 30.8 pg (ref 27.2–33.4)
MCHC: 34.6 g/dL (ref 32.0–36.0)
MCV: 89.2 fL (ref 79.3–98.0)
MONOS PCT: 10 %
Monocytes Absolute: 0.4 10*3/uL (ref 0.1–0.9)
NEUTROS PCT: 52 %
Neutro Abs: 2 10*3/uL (ref 1.5–6.5)
Platelet Count: 117 10*3/uL — ABNORMAL LOW (ref 140–400)
RBC: 4.8 MIL/uL (ref 4.20–5.82)
RDW: 13.3 % (ref 11.0–14.6)
WBC: 3.8 10*3/uL — AB (ref 4.0–10.3)

## 2018-06-04 LAB — RETICULOCYTES
RBC.: 4.8 MIL/uL (ref 4.20–5.82)
Retic Count, Absolute: 28.8 10*3/uL — ABNORMAL LOW (ref 34.8–93.9)
Retic Ct Pct: 0.6 % — ABNORMAL LOW (ref 0.8–1.8)

## 2018-06-04 LAB — LACTATE DEHYDROGENASE: LDH: 200 U/L (ref 125–245)

## 2018-06-04 NOTE — Telephone Encounter (Signed)
Appointments scheduled VS/Calendar printed per 6/13 los

## 2018-09-07 DIAGNOSIS — Z79899 Other long term (current) drug therapy: Secondary | ICD-10-CM | POA: Diagnosis not present

## 2018-09-07 DIAGNOSIS — G479 Sleep disorder, unspecified: Secondary | ICD-10-CM | POA: Diagnosis not present

## 2018-09-07 DIAGNOSIS — E7800 Pure hypercholesterolemia, unspecified: Secondary | ICD-10-CM | POA: Diagnosis not present

## 2018-09-07 DIAGNOSIS — Z23 Encounter for immunization: Secondary | ICD-10-CM | POA: Diagnosis not present

## 2018-09-07 DIAGNOSIS — E038 Other specified hypothyroidism: Secondary | ICD-10-CM | POA: Diagnosis not present

## 2018-09-07 DIAGNOSIS — R69 Illness, unspecified: Secondary | ICD-10-CM | POA: Diagnosis not present

## 2018-09-07 DIAGNOSIS — Z0000 Encounter for general adult medical examination without abnormal findings: Secondary | ICD-10-CM | POA: Diagnosis not present

## 2018-09-14 DIAGNOSIS — E7800 Pure hypercholesterolemia, unspecified: Secondary | ICD-10-CM | POA: Diagnosis not present

## 2018-09-14 DIAGNOSIS — E038 Other specified hypothyroidism: Secondary | ICD-10-CM | POA: Diagnosis not present

## 2018-09-14 DIAGNOSIS — C8590 Non-Hodgkin lymphoma, unspecified, unspecified site: Secondary | ICD-10-CM | POA: Diagnosis not present

## 2018-09-14 DIAGNOSIS — Z23 Encounter for immunization: Secondary | ICD-10-CM | POA: Diagnosis not present

## 2018-09-14 DIAGNOSIS — G479 Sleep disorder, unspecified: Secondary | ICD-10-CM | POA: Diagnosis not present

## 2018-09-14 DIAGNOSIS — R69 Illness, unspecified: Secondary | ICD-10-CM | POA: Diagnosis not present

## 2018-09-14 DIAGNOSIS — Z125 Encounter for screening for malignant neoplasm of prostate: Secondary | ICD-10-CM | POA: Diagnosis not present

## 2018-09-14 DIAGNOSIS — G8191 Hemiplegia, unspecified affecting right dominant side: Secondary | ICD-10-CM | POA: Diagnosis not present

## 2018-09-14 DIAGNOSIS — Z79899 Other long term (current) drug therapy: Secondary | ICD-10-CM | POA: Diagnosis not present

## 2018-09-14 DIAGNOSIS — Z0000 Encounter for general adult medical examination without abnormal findings: Secondary | ICD-10-CM | POA: Diagnosis not present

## 2018-09-24 DIAGNOSIS — R69 Illness, unspecified: Secondary | ICD-10-CM | POA: Diagnosis not present

## 2018-09-25 DIAGNOSIS — Z125 Encounter for screening for malignant neoplasm of prostate: Secondary | ICD-10-CM | POA: Diagnosis not present

## 2018-11-02 ENCOUNTER — Encounter (HOSPITAL_COMMUNITY): Payer: Self-pay | Admitting: Radiology

## 2018-11-02 ENCOUNTER — Emergency Department (HOSPITAL_COMMUNITY)
Admission: EM | Admit: 2018-11-02 | Discharge: 2018-11-02 | Disposition: A | Discharge status: Home / Self Care | Payer: No Typology Code available for payment source | Attending: Emergency Medicine | Admitting: Emergency Medicine

## 2018-11-02 ENCOUNTER — Other Ambulatory Visit: Payer: Self-pay

## 2018-11-02 ENCOUNTER — Emergency Department (HOSPITAL_COMMUNITY): Payer: No Typology Code available for payment source

## 2018-11-02 DIAGNOSIS — Z87891 Personal history of nicotine dependence: Secondary | ICD-10-CM | POA: Insufficient documentation

## 2018-11-02 DIAGNOSIS — R1903 Right lower quadrant abdominal swelling, mass and lump: Secondary | ICD-10-CM | POA: Diagnosis present

## 2018-11-02 DIAGNOSIS — R590 Localized enlarged lymph nodes: Secondary | ICD-10-CM | POA: Diagnosis not present

## 2018-11-02 DIAGNOSIS — R591 Generalized enlarged lymph nodes: Secondary | ICD-10-CM | POA: Diagnosis not present

## 2018-11-02 DIAGNOSIS — K4190 Unilateral femoral hernia, without obstruction or gangrene, not specified as recurrent: Secondary | ICD-10-CM | POA: Diagnosis not present

## 2018-11-02 DIAGNOSIS — Z79899 Other long term (current) drug therapy: Secondary | ICD-10-CM | POA: Insufficient documentation

## 2018-11-02 DIAGNOSIS — N2889 Other specified disorders of kidney and ureter: Secondary | ICD-10-CM | POA: Diagnosis not present

## 2018-11-02 LAB — CBC WITH DIFFERENTIAL/PLATELET
ABS IMMATURE GRANULOCYTES: 0.01 10*3/uL (ref 0.00–0.07)
BASOS PCT: 1 %
Basophils Absolute: 0 10*3/uL (ref 0.0–0.1)
Eosinophils Absolute: 0.1 10*3/uL (ref 0.0–0.5)
Eosinophils Relative: 2 %
HCT: 46 % (ref 39.0–52.0)
Hemoglobin: 15.1 g/dL (ref 13.0–17.0)
IMMATURE GRANULOCYTES: 0 %
LYMPHS PCT: 28 %
Lymphs Abs: 1.3 10*3/uL (ref 0.7–4.0)
MCH: 29.8 pg (ref 26.0–34.0)
MCHC: 32.8 g/dL (ref 30.0–36.0)
MCV: 90.7 fL (ref 80.0–100.0)
MONOS PCT: 10 %
Monocytes Absolute: 0.5 10*3/uL (ref 0.1–1.0)
NEUTROS ABS: 2.8 10*3/uL (ref 1.7–7.7)
NEUTROS PCT: 59 %
PLATELETS: 152 10*3/uL (ref 150–400)
RBC: 5.07 MIL/uL (ref 4.22–5.81)
RDW: 13.2 % (ref 11.5–15.5)
WBC: 4.8 10*3/uL (ref 4.0–10.5)
nRBC: 0 % (ref 0.0–0.2)

## 2018-11-02 LAB — COMPREHENSIVE METABOLIC PANEL
ALBUMIN: 4.3 g/dL (ref 3.5–5.0)
ALT: 26 U/L (ref 0–44)
ANION GAP: 9 (ref 5–15)
AST: 25 U/L (ref 15–41)
Alkaline Phosphatase: 70 U/L (ref 38–126)
BILIRUBIN TOTAL: 0.5 mg/dL (ref 0.3–1.2)
BUN: 20 mg/dL (ref 8–23)
CHLORIDE: 105 mmol/L (ref 98–111)
CO2: 27 mmol/L (ref 22–32)
Calcium: 9.1 mg/dL (ref 8.9–10.3)
Creatinine, Ser: 1.3 mg/dL — ABNORMAL HIGH (ref 0.61–1.24)
GFR calc Af Amer: >60 mL/min (ref >60)
GFR calc non Af Amer: 53 mL/min — ABNORMAL LOW (ref >60)
GLUCOSE: 91 mg/dL (ref 70–99)
POTASSIUM: 4.1 mmol/L (ref 3.5–5.1)
SODIUM: 141 mmol/L (ref 135–145)
TOTAL PROTEIN: 6.9 g/dL (ref 6.5–8.1)

## 2018-11-02 MED ORDER — IOPAMIDOL (ISOVUE-300) INJECTION 61%
100.0000 mL | Freq: Once | INTRAVENOUS | Status: AC | PRN
  Administered 2018-11-02: 100 mL via INTRAVENOUS

## 2018-11-02 MED ORDER — IOPAMIDOL (ISOVUE-300) INJECTION 61%
INTRAVENOUS | Status: AC
  Filled 2018-11-02: qty 100

## 2018-11-02 MED ORDER — SODIUM CHLORIDE (PF) 0.9 % IJ SOLN
INTRAMUSCULAR | Status: AC
  Filled 2018-11-02: qty 50

## 2018-11-02 NOTE — ED Triage Notes (Addendum)
Pt to ED by POV with c/o of a inguinal hernia. Pt states it happened Saturday when outside gardening and lifting heavy material. Pt states he noticed it when he was in the shower that night. Pt states he has mild pain. Pt was seen by Dr. Dalbert Batman (surgeon) and was told he has an atypical hernia and to come to ED to get it surgically fixed asap for intestines in his leg and could rot. Pt has had a inguinal hernia in the past. Pt A&O x4 and ambulatory.

## 2018-11-02 NOTE — ED Notes (Signed)
Patient transported to CT 

## 2018-11-02 NOTE — ED Notes (Signed)
Pt unable to sign for discharge due to topaz malfunction. Pt has no questions regarding discharge at this time.

## 2018-11-02 NOTE — Discharge Instructions (Addendum)
Follow-up with Dr. Irene Limbo for further evaluation of your swollen lymph nodes.

## 2018-11-02 NOTE — ED Provider Notes (Signed)
[facility] DEPT Provider Note   CSN: [id number] Arrival date & time: 11/02/18  1556     History   Chief Complaint Chief Complaint  Patient presents with  . Hernia    HPI Ethan Stewart is a 72 y.o. male.  HPI Patient presents with hernia.  States that on Saturday with today being Monday patient been doing some yard work.  States he does normally work out at [employer] without problem but states Saturday he noticed a swelling in his right groin/thigh.  Mild pain.  States he went to his primary care doctor, Dr. Harrington Challenger and told him he had a hernia down his leg.  States they called Dr. Dalbert Batman who patient seen before and told that he needs to come in the ER for surgery because the hernia could rot.  No nausea vomiting.  States the pain has gotten smaller.  No constipation.  Abdomen is not swollen. Past Medical History:  Diagnosis Date  . Anxiety   . Arm numbness 08/07/15   Limited movement due to tumor on spine  . Bone cancer ([place])     tumor on C5  . 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 Diffuse large B cell lymphoma ([place])    biposy 07/25/2105  . GERD (gastroesophageal reflux disease)   . H. pylori infection   . History of bleeding ulcers 1990's  . History of blood transfusion     Patient Active Problem List   Diagnosis Date Noted  . Skin lesion 06/27/2016  . Pain in right foot 04/11/2016  . Port catheter in place 04/11/2016  . Lobar pneumonia ([place]) 12/29/2015  . Antineoplastic chemotherapy induced pancytopenia ([place])   . Pyrexia   . Swelling   . Cellulitis of right lower extremity 09/21/2015  . Acute deep vein thrombosis (DVT) of distal vein of right lower extremity ([place]) 09/21/2015  . Hypoalbuminemia 09/19/2015  . Dehydration 09/19/2015  . Febrile neutropenia ([place]) 09/19/2015  . Elevated LFTs 09/02/2015  . Abnormal liver function tests   . Acute respiratory failure with hypoxemia ([place])   . Hypokalemia   . Dysphagia   . MDD (major depressive  disorder), recurrent severe, without psychosis ([place]) 08/31/2015  . Dysphagia, pharyngoesophageal phase   . Protein-calorie malnutrition, severe ([place])   . Pressure ulcer 08/30/2015  . Acute respiratory failure ([place])   . HCAP (healthcare-associated pneumonia) 08/21/2015  . Sepsis ([place]) 08/21/2015  . Anemia associated with chemotherapy 08/21/2015  . Sinus tachycardia 08/21/2015  . GERD (gastroesophageal reflux disease) 08/21/2015  . Blood poisoning   . Hypoxia   . Acute on chronic respiratory failure with hypoxia ([place])   . ARDS (adult respiratory distress syndrome) ([place])   . S/P cervical spinal fusion 07/26/2015  . DLBCL (diffuse large B cell lymphoma) ([place]) 07/07/2015  . Neck pain, acute 07/04/2015  . 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 Bone lesion 06/30/2015    Past Surgical History:  Procedure Laterality Date  . ANTERIOR CERVICAL CORPECTOMY N/A 07/26/2015   Procedure: Cervical five Corpectomy/Cervical four-six Fusion/Plate ;  Surgeon: Eustace Moore, MD;  Location: [place] [facility] ORS;  Service: Neurosurgery;  Laterality: N/A;  C5 Corpectomy/C4-6 Fusion/Plate C4-6  . COLONOSCOPY    . EYE SURGERY Right    catheter  . HERNIA REPAIR Bilateral    with mesh  . IR GENERIC HISTORICAL  02/03/2017   IR REMOVAL TUN ACCESS W/ PORT W/O FL MOD SED 02/03/2017 Markus Daft, MD [facility]-INTERV RAD        Home  Medications    Prior to Admission medications   Medication Sig Start Date End Date Taking? Authorizing Provider  levothyroxine (SYNTHROID, LEVOTHROID) 88 MCG tablet Take 88 mcg by mouth daily. 08/16/18  Yes [provider]  Multiple Vitamin (MULTIVITAMIN WITH MINERALS) TABS tablet Take 1 tablet by mouth daily. Reported on 02/19/2016   Yes [provider]  simvastatin (ZOCOR) 20 MG tablet Take 20 mg by mouth daily.   Yes [provider]  temazepam (RESTORIL) 15 MG capsule Take 15 mg by mouth at bedtime and may repeat dose one time if needed.   Yes [provider]     Family History Family History  Problem Relation Age of Onset  . Hypertension Mother     Social History Social History   Tobacco Use  . Smoking status: Former Smoker    Packs/day: 1.00    Years: 10.00    Pack years: 10.00    Types: Cigarettes    Last attempt to quit: 12/23/1981    Years since quitting: 36.8  . Smokeless tobacco: Never Used  Substance Use Topics  . Alcohol use: Yes    Alcohol/week: 5.0 standard drinks    Types: 2 Glasses of wine, 3 Cans of beer per week  . Drug use: No     Allergies   Neulasta [pegfilgrastim]   Review of Systems Review of Systems  Constitutional: Negative for appetite change.  HENT: Negative for congestion.   Respiratory: Negative for shortness of breath.   Cardiovascular: Negative for chest pain.  Gastrointestinal: Positive for abdominal pain.  Genitourinary: Negative for flank pain.  Musculoskeletal: Negative for back pain.  Skin: Negative for rash.  Neurological: Negative for weakness and numbness.  Psychiatric/Behavioral: Negative for confusion.     Physical Exam Updated Vital Signs BP (!) 144/78   Pulse 61   Temp 97.7 F (36.5 C)   Resp 13   Ht 6\' 1"  (1.854 m)   Wt 102.1 kg   SpO2 99%   BMI 29.69 kg/m   Physical Exam  Constitutional: He appears well-developed.  HENT:  Head: Normocephalic.  Eyes: Pupils are equal, round, and reactive to light.  Neck: Neck supple.  Cardiovascular: Normal rate.  Pulmonary/Chest: Effort normal.  Abdominal: He exhibits mass. There is no tenderness.  Fullness of right groin.  Goes somewhat down thigh.  Does reduce but small lump still left over.  Minimal pain.  No abdominal distention  Musculoskeletal: He exhibits no edema.  Neurological: He is alert.  Skin: Skin is warm. Capillary refill takes less than 2 seconds.     ED Treatments / Results  Labs (all labs ordered are listed, but only abnormal results are displayed) Labs Reviewed  COMPREHENSIVE METABOLIC PANEL -  Abnormal; Notable for the following components:      Result Value   Creatinine, Ser 1.30 (*)    GFR calc non Af Amer 53 (*)    All other components within normal limits  CBC WITH DIFFERENTIAL/PLATELET    EKG None  Radiology Ct Abdomen Pelvis W Contrast  Result Date: 11/02/2018 CLINICAL DATA:  72 year old male with history of large B-cell lymphoma. Inguinal pain following outside gardening, heavy lifting two days ago. ?Noticed in the shower that night? . Patient reports an inguinal hernia in the past. EXAM: CT ABDOMEN AND PELVIS WITH CONTRAST TECHNIQUE: Multidetector CT imaging of the abdomen and pelvis was performed using the standard protocol following bolus administration of intravenous contrast. CONTRAST:  147mL ISOVUE-300 IOPAMIDOL (ISOVUE-300) INJECTION 61% COMPARISON:  PET-CT 03/12/2016. FINDINGS: Lower chest: Mild cardiomegaly. No pericardial or pleural effusion. Dependent lower lobe atelectasis, less likely pulmonary scarring. Hepatobiliary: Pneumobilia, and small volume of gas also in the gallbladder (series 2, image 20) is chronic but increased since 2017. Otherwise negative. Pancreas: Negative. Spleen: Negative, no splenomegaly. Adrenals/Urinary Tract: Normal adrenal glands. Bilateral renal enhancement and contrast excretion is symmetric and within normal limits. There are small benign renal parapelvic cysts on the left. Negative urinary bladder aside from mild mass effect from right external iliac lymphadenopathy (series 2, image 88). Stomach/Bowel: Negative large bowel. Normal appendix (coronal image 37). Negative terminal ileum. No dilated or inflamed small bowel. No bowel hernia identified. Decompressed stomach. Negative duodenum. No free air, free fluid. Vascular/Lymphatic: Bulky right inguinal lymph nodes are new since 2017 and individually measure 22-26 millimeter short axis. A large partially visible node is suspected anterior to the muscle air on series 2, image 104, and this  simulates skeletal muscle on coronal image 25. Multiple other asymmetric and enlarged nodes are completely visible, the largest is 4.2 centimeters long axis. Superimposed right external iliac chains lymphadenopathy measuring 2.4 centimeters (series 2 image 88) is new since 2017. No contralateral left pelvic lymphadenopathy. No abdominal lymphadenopathy. Aortoiliac calcified atherosclerosis. Major arterial structures are patent. Portal venous system is patent. No portal venous gas. Reproductive: Negative; no evidence of inguinal hernia. Abnormal inguinal lymph nodes described above. Other: No pelvic free fluid. Musculoskeletal: No acute or suspicious osseous lesion identified. IMPRESSION: 1. Negative for inguinal or bowel hernia but positive for bulky right inguinal and external iliac lymphadenopathy which is new since a 2017 PET-CT and highly suspicious for recurrent Lymphoma. This would be amenable to Ultrasound-guided needle biopsy. 2. No abdominal or contralateral left hemi pelvic lymphadenopathy. No other acute findings identified in the abdomen or pelvis. 3. Chronic pneumobilia.  Aortic Atherosclerosis (ICD10-I70.0). Electronically Signed   By: Genevie Ann M.D.   On: 11/02/2018 20:42    Procedures Procedures (including critical care time)  Medications Ordered in ED Medications  iopamidol (ISOVUE-300) 61 % injection (has no administration in time range)  sodium chloride (PF) 0.9 % injection (has no administration in time range)  iopamidol (ISOVUE-300) 61 % injection 100 mL (100 mLs Intravenous Contrast Given 11/02/18 1945)     Initial Impression / Assessment and Plan / ED Course  I have reviewed the triage vital signs and the nursing notes.  Pertinent labs & imaging results that were available during my care of the patient were reviewed by me and considered in my medical decision making (see chart for details).     Patient with right groin pain.  Had swelling.  Initially felt as if there could  be a reducible hernia however on CT scan no hernia but did have lymphadenopathy that could be recurrence of his lymphoma.  Labs reassuring.  Message sent to Dr. Irene Limbo, and he will see the patient in follow-up.  Final Clinical Impressions(s) / ED Diagnoses   Final diagnoses:  Lymphadenopathy    ED Discharge Orders    None       Davonna Belling, MD 11/02/18 2336

## 2018-11-03 ENCOUNTER — Telehealth: Payer: Self-pay | Admitting: Hematology

## 2018-11-03 ENCOUNTER — Telehealth: Payer: Self-pay | Admitting: *Deleted

## 2018-11-03 NOTE — Telephone Encounter (Signed)
Called regarding 11/12 sch msg patient wanted to see him before 11/21 so I transferred to nurse

## 2018-11-03 NOTE — Telephone Encounter (Signed)
Patient left VM stating that scheduling had left message that new appt is 11/21. Patient wants to know if there is anything sooner. Called patient and advised that per schedulers, the appt made for him was the earliest available. Patient verbalized understanding.

## 2018-11-03 NOTE — Telephone Encounter (Signed)
Patient left VM this morning stating he was seen in ED last night and his cancer has returned. Requests appt as soon as possible  with Dr. Irene Limbo. Current appt is 12/03/18. Message in [place] this AM from Dr. Irene Limbo: Plz schedule patient for f/yu in 7-10 days with labs (CBC, CMP, LDH) and MD visit.   Thanks   [place]  [facility] scheduling [place] message sent with above information included. Contacted patient and advised that his call was received and a message received from Dr. Irene Limbo, message sent to scheduling. Advised patient that scheduling will contact him. Verbalized understanding.

## 2018-11-05 ENCOUNTER — Telehealth: Payer: Self-pay | Admitting: *Deleted

## 2018-11-05 NOTE — Telephone Encounter (Signed)
Patient called to inform office that he will not be able to keep appt here 11/21. Was told that if cancer recurred might need stem cell transplant, he  called [facility] Dr. Cassell Clement. PET scan is scheduled for 11/21 and he has MD appt with Dr. Cassell Clement 11/25. Will keep appt with Dr. Irene Limbo 12/12.

## 2018-11-06 ENCOUNTER — Telehealth: Payer: Self-pay | Admitting: *Deleted

## 2018-11-06 NOTE — Telephone Encounter (Signed)
Error

## 2018-11-09 ENCOUNTER — Telehealth: Payer: Self-pay | Admitting: Hematology

## 2018-11-09 NOTE — Telephone Encounter (Signed)
Patient called to cancel °

## 2018-11-12 ENCOUNTER — Other Ambulatory Visit: Payer: Self-pay

## 2018-11-12 ENCOUNTER — Ambulatory Visit: Payer: Self-pay | Admitting: Hematology

## 2018-11-12 DIAGNOSIS — C8330 Diffuse large B-cell lymphoma, unspecified site: Secondary | ICD-10-CM | POA: Diagnosis not present

## 2018-11-12 DIAGNOSIS — C8338 Diffuse large B-cell lymphoma, lymph nodes of multiple sites: Secondary | ICD-10-CM | POA: Diagnosis not present

## 2018-11-13 DIAGNOSIS — L814 Other melanin hyperpigmentation: Secondary | ICD-10-CM | POA: Diagnosis not present

## 2018-11-13 DIAGNOSIS — D485 Neoplasm of uncertain behavior of skin: Secondary | ICD-10-CM | POA: Diagnosis not present

## 2018-11-13 DIAGNOSIS — D0461 Carcinoma in situ of skin of right upper limb, including shoulder: Secondary | ICD-10-CM | POA: Diagnosis not present

## 2018-11-13 DIAGNOSIS — L821 Other seborrheic keratosis: Secondary | ICD-10-CM | POA: Diagnosis not present

## 2018-11-13 DIAGNOSIS — C4442 Squamous cell carcinoma of skin of scalp and neck: Secondary | ICD-10-CM | POA: Diagnosis not present

## 2018-11-13 DIAGNOSIS — L918 Other hypertrophic disorders of the skin: Secondary | ICD-10-CM | POA: Diagnosis not present

## 2018-11-13 DIAGNOSIS — D225 Melanocytic nevi of trunk: Secondary | ICD-10-CM | POA: Diagnosis not present

## 2018-11-13 DIAGNOSIS — L739 Follicular disorder, unspecified: Secondary | ICD-10-CM | POA: Diagnosis not present

## 2018-11-13 DIAGNOSIS — L570 Actinic keratosis: Secondary | ICD-10-CM | POA: Diagnosis not present

## 2018-11-16 ENCOUNTER — Telehealth: Payer: Self-pay | Admitting: *Deleted

## 2018-11-16 DIAGNOSIS — Z9221 Personal history of antineoplastic chemotherapy: Secondary | ICD-10-CM | POA: Diagnosis not present

## 2018-11-16 DIAGNOSIS — C8333 Diffuse large B-cell lymphoma, intra-abdominal lymph nodes: Secondary | ICD-10-CM | POA: Diagnosis not present

## 2018-11-16 NOTE — Telephone Encounter (Signed)
Dr. Cassell Clement called to speak with Dr. Irene Limbo to coordinate care: [phone]. Information given to Dr. Irene Limbo

## 2018-11-25 DIAGNOSIS — C8333 Diffuse large B-cell lymphoma, intra-abdominal lymph nodes: Secondary | ICD-10-CM | POA: Diagnosis not present

## 2018-11-25 DIAGNOSIS — C8330 Diffuse large B-cell lymphoma, unspecified site: Secondary | ICD-10-CM | POA: Diagnosis not present

## 2018-11-25 DIAGNOSIS — Z452 Encounter for adjustment and management of vascular access device: Secondary | ICD-10-CM | POA: Diagnosis not present

## 2018-11-27 DIAGNOSIS — C8333 Diffuse large B-cell lymphoma, intra-abdominal lymph nodes: Secondary | ICD-10-CM | POA: Diagnosis not present

## 2018-11-27 DIAGNOSIS — C8335 Diffuse large B-cell lymphoma, lymph nodes of inguinal region and lower limb: Secondary | ICD-10-CM | POA: Diagnosis not present

## 2018-11-27 DIAGNOSIS — R590 Localized enlarged lymph nodes: Secondary | ICD-10-CM | POA: Diagnosis not present

## 2018-11-30 ENCOUNTER — Telehealth: Payer: Self-pay | Admitting: *Deleted

## 2018-11-30 NOTE — Telephone Encounter (Signed)
11/26/18. Received VM from Wells Guiles [phone]) at [facility]: per Dr. Cassell Clement, patient has had changes.  Asked if Dr. Irene Limbo spoken with Dr. Cassell Clement. F/u call to Wells Guiles: Per Wells Guiles - Due to results of patient CT scan at [facility] ED 11/11, Dr. Cassell Clement ordered PET at [facility] 11/21, followed by Rt inguinal biopsy 12/6 and port placement 12/4 at [facility].  Wells Guiles to send results of all tests to office.

## 2018-11-30 NOTE — Telephone Encounter (Signed)
Patient left VM asking for call back to discuss plan of care.Contacted patient, spoke with spouse. Advised her that results had been sent to Dr. Irene Limbo for his review, except biopsy (not resulted at this time). Patient's appt is 12/12 for lab at 8:45AM with MD appt to follow. Dr. Irene Limbo will discuss plans with patient at that time. Contacted Wells Guiles in Dr. Gerre Scull office to request results of biopsy from 12/6 and a CD of imaging studies. Wells Guiles stated she will send as soon as resulted and contact imaging for CD. Wells Guiles also stated RX sent for EMLA creme to [facility] on [address]

## 2018-12-01 DIAGNOSIS — R590 Localized enlarged lymph nodes: Secondary | ICD-10-CM | POA: Diagnosis not present

## 2018-12-01 DIAGNOSIS — C8333 Diffuse large B-cell lymphoma, intra-abdominal lymph nodes: Secondary | ICD-10-CM | POA: Diagnosis not present

## 2018-12-02 NOTE — Progress Notes (Signed)
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 Cytogenetics and Fish showed BCL 2 positivity but negative for BCL 6 and cMYC CD10 positivity suggestive of possibly GCB subtype.  Patient received first cycle of R CHOP on 08/08/2015 and intrathecal methotrexate with hydrocortisone on 08/09/2015. Patient subsequently was admitted with ARDS likely due to neulasta -treated with high dose steroids and empirically with broad spectrum antibiotics.  2nd cycle of chemotherapy delayed to allow for rehabilitation (was due on 08/31/2015).  Received R-mini-CHOP for cycle 2 and tolerated it without significant cytopenias. Was admitted briefly for a mild lower extremity cellulitis and DVT. Cellulitis now resolved. Patient completing his oral antibiotics.  PET/CT scan on 09/29/2015 with no evidence of disease progression. Good response in his mesenteric lymph nodes. Inguinal  lymph nodes on the right side. Decreased uptake in his C5 level.  No new lesions noted.   Cycle 3 (10/02/2015)  of R- CHOP ( we increased the doxorubicin dose back to 50 mg/m and keep the cyclophosphamide dose reduction at 400 mg/m]  Cycle 4 (10/23/2015) of R-CHOP ( we increased the doxorubicin dose back to 50 mg/m and kept the cyclophosphamide dose reduction at 400 mg/m].  PET/CT 11/10/2015: Shows improvement in most lesions but is noted to have a new FDG avid lymph node in the mesentery of the small bowel.  Cycle 5 on 11/13/2015 R CHOP - received full dose.  Cycle 6 on 12/11/2015 R-CHOP full dose.  Admitted with neutropenic fevers and treatment for possible early pneumonia.  This resolved and he was able to discharge home  01/09/2016 through 01/22/2016: The patient was treated to the C4-C6 levels of the spine to a dose of 30 gray in 10 fractions using a 2 field technique. The patient was also treated to an abdominal lymph node region to a dose of 30 gray in 10 fractions using a 3 field technique. This treatment consisted of a 3-D conformal technique with daily image guidance utilized.   Repeat PET CT scan in March 12 2016 -showed some enlarged mesenteric lymph nodes which were FDG avid.  CT guided FNA of mesenteric lymph nodes at [facility] on 03/29/2016 - showed scant cellularity. No overt evidence of lymphoma.  Rpt PET/CT 05/15/2016 at [facility]- show similar appearing enlarged mesenteric lymph nodes which are FDG avid. No change in size and no other evidence of disease progression.  PET/CT 07/24/2016 - and [facility] shows no evidence of disease progression  PET/CT 01/22/2017:  Result Impression   1.Stable low-level metabolic activity in small lymph nodes in the small bowel mesentery. No new FDG avid disease. 2.Persistent hypermetabolic soft tissue thickening overlying the coccyx which could relate to a decubitus ulcer and should be  amenable to direct inspection. 3.Stable pulmonary nodules which do not appear hypermetabolic, however, several are below the size resolution of PET in size. Continued attention on follow-up is suggested. 4.Previously seen hypermetabolic skin thickening near the right ear has resolved. 5.Ancillary CT findings as above.    INTERVAL HISTORY:  Ethan Stewart is here for his scheduled 6 month follow-up for diffuse large B-cell lymphoma. The patient's last visit with [place] was on 06/04/18. He is accompanied today by his wie. The pt reports that he is doing well overall.   Prior to our visit, the patient presented to the ED on 11/02/18 after noticing a swelling in his right groin/thigh two days prior, with some mild pain associated. This was at first believed to be a hernia, as the patient had strained while gardening two days prior to his ED visit, but the subsequent CT A/P revealed concerns of recurrent lymphoma, as noted below. He then returned to care with Dr. Alver Sorrow at [facility] because he was concerned for the need for a possible transplant, for further work up, and completed a PET/CT on 11/12/18. He then underwent a lymph node biopsy on 11/27/18, the results of which are currently pending. The patient has also had a port placement on 11/25/18.   The pt reports that this swelling has not changed in the last month. He has not developed any fevers, chills or night sweats. The pt denies any neck pain or bone pains, or any abdominal pains. He notes that he has enjoyed stable energy levels otherwise.   The pt also notes that his appetite has been "great," he has gained a few pounds, and has kept very active with attending the gym and going on long walks.   Of note since the patient's last visit, pt has had a CT A/P completed on 11/02/18 with results revealing Negative for inguinal or bowel hernia but positive for bulky right inguinal and external iliac lymphadenopathy which is new since a 2017 PET-CT  and highly suspicious for recurrent Lymphoma. This would be amenable to Ultrasound-guided needle biopsy. 2. No abdominal or contralateral left hemi pelvic lymphadenopathy. No other acute findings identified in the abdomen or pelvis. 3. Chronic pneumobilia.  Aortic Atherosclerosis.  The pt then had a PET/CT on 11/12/18 at [facility] which revealed Interval development of multistation lymphadenopathy in the abdomen and pelvis as detailed above- Deauville 5. 2. Presence of pneumobilia could reflect sphincter of Oddi dysfunction versus recent procedure. Correlate with clinical history and upcoming comprehensive metabolic panel. 3.  Ancillary CT findings  Lab results today (12/03/18) of CBC w/diff and CMP is as follows: all values are WNL except for PLT at 137k. 12/03/18 LDH is 207  On review of systems, pt reports right inguinal swelling, stable energy levels, staying active, and denies fevers, chills, night sweats, neck pains, bone pains, abdominal pains, testicular pain or swelling, leg swelling, and any other symptoms.    REVIEW OF SYSTEMS:    A 10+ POINT REVIEW OF SYSTEMS WAS OBTAINED including neurology, dermatology, psychiatry, cardiac, respiratory, lymph, extremities, GI, GU, Musculoskeletal, constitutional, breasts, reproductive, HEENT.  All pertinent positives are noted in the HPI.  All others are negative.   ALLERGIES:  is allergic to neulasta [pegfilgrastim].  MEDICATIONS:  Current Outpatient  Medications  Medication Sig Dispense Refill  . levothyroxine (SYNTHROID, LEVOTHROID) 88 MCG tablet Take 88 mcg by mouth daily.  2  . Multiple Vitamin (MULTIVITAMIN WITH MINERALS) TABS tablet Take 1 tablet by mouth daily. Reported on 02/19/2016    . simvastatin (ZOCOR) 20 MG tablet Take 20 mg by mouth daily.    . temazepam (RESTORIL) 15 MG capsule Take 15 mg by mouth at bedtime and may repeat dose one time if needed.     No current facility-administered medications for this visit.     PHYSICAL  EXAMINATION: ECOG PERFORMANCE STATUS: 1 - Symptomatic but completely ambulatory  Vitals:   12/03/18 0926  BP: (!) 144/80  Pulse: 62  Resp: 17  Temp: 98 F (36.7 C)  SpO2: 100%   Filed Weights   12/03/18 0926  Weight: 227 lb 4.8 oz (103.1 kg)    GENERAL:alert, in no acute distress and comfortable SKIN: no acute rashes, no significant lesions EYES: conjunctiva are pink and non-injected, sclera anicteric OROPHARYNX: MMM, no exudates, no oropharyngeal erythema or ulceration NECK: supple, no JVD LYMPH:  Couple palpable right inguinal lymph nodes with a dominant 3-4cm right inguinal LN and two to three others that are 1-2cm in size. No palpable lymphadenopathy in the cervical or axillary regions LUNGS: clear to auscultation b/l with normal respiratory effort HEART: regular rate & rhythm ABDOMEN:  normoactive bowel sounds , non tender, not distended. No palpable hepatosplenomegaly.  Extremity: no pedal edema PSYCH: alert & oriented x 3 with fluent speech NEURO: no focal motor/sensory deficits   LABORATORY DATA:   CBC Latest Ref Rng & Units 12/03/2018 11/02/2018 06/04/2018  WBC 4.0 - 10.5 K/uL 4.9 4.8 3.8(L)  Hemoglobin 13.0 - 17.0 g/dL 15.3 15.1 14.8  Hematocrit 39.0 - 52.0 % 44.8 46.0 42.8  Platelets 150 - 400 K/uL 137(L) 152 117(L)   ANC 300  CMP Latest Ref Rng & Units 12/03/2018 11/02/2018 06/04/2018  Glucose 70 - 99 mg/dL 90 91 88  BUN 8 - 23 mg/dL '18 20 17  ' Creatinine 0.61 - 1.24 mg/dL 1.08 1.30(H) 1.05  Sodium 135 - 145 mmol/L 144 141 140  Potassium 3.5 - 5.1 mmol/L 4.4 4.1 4.2  Chloride 98 - 111 mmol/L 107 105 105  CO2 22 - 32 mmol/L '27 27 27  ' Calcium 8.9 - 10.3 mg/dL 9.6 9.1 9.4  Total Protein 6.5 - 8.1 g/dL 7.1 6.9 6.7  Total Bilirubin 0.3 - 1.2 mg/dL 0.6 0.5 0.5  Alkaline Phos 38 - 126 U/L 90 70 93  AST 15 - 41 U/L 25 25 32  ALT 0 - 44 U/L '25 26 27   ' . Lab Results  Component Value Date   LDH 200 06/04/2018    Fine Needle Aspiration, Lymph NodeResulted:  12/04/2018 1:14 PM [facility] Result Narrative  ACCESSION NUMBER: [id number] RECEIVED: 11/27/2018 ORDERING PHYSICIAN: RAKHEE Megan Mans , MD PATIENT NAME: Ethan Stewart CYTOLOGY REPORT * Amended * Final Cytologic Interpretation AMENDED REPORT  AMENDED 12/04/2018 TO ADD DIAGNOSIS   A. Right inguinal lymph node, Fine Needle Aspiration II (smears and cell block): B-cell lymphoma with germinal center phenotype (see comment)  Specimen Adequacy: Satisfactory for evaluation.  B. Cytology core biopsy: B-cell lymphoma with germinal center phenotype (see comment)  Specimen Adequacy: Satisfactory for evaluation.    COMMENT:Sections of the core biopsy demonstrate an atypical vaguely nodular lymphoid infiltrate composed of medium sized lymphoid cells with mature chromatin, irregular nuclear membranes and scant cytoplasm. Focally some areas show larger  cells. Few mitotic figures are present. Appropriately controlled immunohistochemical stains are performed. Sheets of B-cells are highlighted by CD20. These B-cells co-express BCL2 and BCL6 (focal, nodular distribution). They are negative for CD30 and MYC. CD21 demonstrates few residual disrupted follicular dendritic meshworks. The proliferation index is low (10-20%), as demonstrated by Ki-67. Scattered small T-cells are highlighted by CD3. Concurrent flow cytometry identified a kappa light chain restricted B-cell population, co-expressing CD10.  Overall, the findings are consistent with involvement by a B-cell lymphoma with a germinal center phenotype (GC-type), with focal follicular architecture and a low proliferation index. Given the nature of the sample (i.e. core biopsy) a final architectural subclassification is not possible. If clinically feasible, an excisional biopsy is recommended.  Correlation with clinical and cytogenetic/molecular data is recommended.     RADIOGRAPHIC STUDIES: I have personally reviewed the radiological images as listed and agreed with the findings in the report. No results found.    ASSESSMENT & PLAN:   1) Diffuse large B-cell lymphoma stage IV AE with involvement of mesenteric, inguinal lymph nodes and C5 vertebral body with spinal cord impingement  Patient is status post six cycles of R-CHOP and R-mini-CHOP, completed in 2016, as noted above.  2) History of capillary leak syndrome with ARDS requiring intubation likely due to G-CSF.  PLAN: -Discussed pt labwork today, 12/03/18; blood chemistries are normal, blood counts are normal except for 137k.  -12/03/18 LDH is 207 -Discussed the 11/02/18 CT A/P which revealed Negative for inguinal or bowel hernia but positive for bulky right inguinal and external iliac lymphadenopathy which is new since a 2017 PET-CT and highly suspicious for recurrent Lymphoma. This would be amenable to Ultrasound-guided needle biopsy. 2. No abdominal or contralateral left hemi pelvic lymphadenopathy. No other acute findings identified in the abdomen or pelvis. 3. Chronic pneumobilia.  Aortic Atherosclerosis. -Discussed the 11/12/18 PET/CT which revealed  Interval development of multistation lymphadenopathy in the abdomen and pelvis as detailed above- Deauville 5. 2. Presence of pneumobilia could reflect sphincter of Oddi dysfunction versus recent procedure. Correlate with clinical history and upcoming comprehensive metabolic panel. 3. Ancillary CT findings  -Discussed that there is a possibility that this could be a lower grade lymphoma such as a follicular lymphoma, though the primary concern is for DLBCL  -11/27/18 US guided LN Biopsy pathology report is as noted above. " B-cell lymphoma with a germinal center phenotype (GC-type), with focal follicular architecture and a low proliferation index. Given the nature of the sample (i.e. core biopsy) a final architectural subclassification is not  possible. If clinically feasible, an excisional biopsy is recommended."  -since this sample represents a low proliferation index CD10+ B cell NHL and did not allow for architectural classification - would recommend referral to general surgery for excisional LN biopsy. -if DLBCL relapse noted possible salvage therapy for a relapsed DLBCL would be R-ICE with subsequent transplant intent, and that there would be a need for G-CSF support  -Neupogen for 5 days, beginning on Day 5, to reduce the risk of previously encountered ARDS, lung injury which developed after previous Neulasta support during C1 R-CHOP -Provided supplemental information on R-ICE, and will set pt up for chemotherapy counseling, which he prefers, and understands that treatment course may change with pending pathology result  3) History of right lower extremity distal DVT. Resolved. Completed 6 months of treatment in 02/2016 -not currently on anticoagulation.  4) Mild chronic thrombocytopenia- platelets 137K, we will be monitoring.  General surgery surgery referral urgently to Dr Fanny Skates  or 1st available for inguinal LN excisional biopsy for NHL.   The total time spent in the appt was 40 minutes and more than 50% was on counseling and direct patient cares.   Sullivan Lone MD MS Hematology/Oncology Physician [facility]  (Office):       [phone] (Work cell):  [phone] (Fax):           [phone]  I, Baldwin Jamaica, am acting as a scribe for Dr. Sullivan Lone.   .I have reviewed the above documentation for accuracy and completeness, and I agree with the above. Brunetta Genera MD

## 2018-12-03 ENCOUNTER — Telehealth: Payer: Self-pay | Admitting: *Deleted

## 2018-12-03 ENCOUNTER — Inpatient Hospital Stay: Payer: No Typology Code available for payment source | Attending: Hematology

## 2018-12-03 ENCOUNTER — Inpatient Hospital Stay (HOSPITAL_BASED_OUTPATIENT_CLINIC_OR_DEPARTMENT_OTHER): Payer: No Typology Code available for payment source | Admitting: Hematology

## 2018-12-03 VITALS — BP 144/80 | HR 62 | Temp 98.0°F | Resp 17 | Ht 73.0 in | Wt 227.3 lb

## 2018-12-03 DIAGNOSIS — Z86718 Personal history of other venous thrombosis and embolism: Secondary | ICD-10-CM | POA: Diagnosis not present

## 2018-12-03 DIAGNOSIS — C8330 Diffuse large B-cell lymphoma, unspecified site: Secondary | ICD-10-CM

## 2018-12-03 DIAGNOSIS — Z9221 Personal history of antineoplastic chemotherapy: Secondary | ICD-10-CM | POA: Diagnosis not present

## 2018-12-03 DIAGNOSIS — C8338 Diffuse large B-cell lymphoma, lymph nodes of multiple sites: Secondary | ICD-10-CM | POA: Diagnosis not present

## 2018-12-03 DIAGNOSIS — D696 Thrombocytopenia, unspecified: Secondary | ICD-10-CM

## 2018-12-03 LAB — CMP (CANCER CENTER ONLY)
ALT: 25 U/L (ref 0–44)
AST: 25 U/L (ref 15–41)
Albumin: 4.2 g/dL (ref 3.5–5.0)
Alkaline Phosphatase: 90 U/L (ref 38–126)
Anion gap: 10 (ref 5–15)
BUN: 18 mg/dL (ref 8–23)
CHLORIDE: 107 mmol/L (ref 98–111)
CO2: 27 mmol/L (ref 22–32)
Calcium: 9.6 mg/dL (ref 8.9–10.3)
Creatinine: 1.08 mg/dL (ref 0.61–1.24)
GFR, Est AFR Am: >60 mL/min (ref >60)
GFR, Estimated: >60 mL/min (ref >60)
GLUCOSE: 90 mg/dL (ref 70–99)
POTASSIUM: 4.4 mmol/L (ref 3.5–5.1)
SODIUM: 144 mmol/L (ref 135–145)
Total Bilirubin: 0.6 mg/dL (ref 0.3–1.2)
Total Protein: 7.1 g/dL (ref 6.5–8.1)

## 2018-12-03 LAB — CBC WITH DIFFERENTIAL/PLATELET
ABS IMMATURE GRANULOCYTES: 0.02 10*3/uL (ref 0.00–0.07)
BASOS PCT: 0 %
Basophils Absolute: 0 10*3/uL (ref 0.0–0.1)
Eosinophils Absolute: 0.1 10*3/uL (ref 0.0–0.5)
Eosinophils Relative: 2 %
HEMATOCRIT: 44.8 % (ref 39.0–52.0)
Hemoglobin: 15.3 g/dL (ref 13.0–17.0)
IMMATURE GRANULOCYTES: 0 %
LYMPHS ABS: 1.2 10*3/uL (ref 0.7–4.0)
Lymphocytes Relative: 24 %
MCH: 30.4 pg (ref 26.0–34.0)
MCHC: 34.2 g/dL (ref 30.0–36.0)
MCV: 88.9 fL (ref 80.0–100.0)
Monocytes Absolute: 0.6 10*3/uL (ref 0.1–1.0)
Monocytes Relative: 11 %
NEUTROS ABS: 3.1 10*3/uL (ref 1.7–7.7)
Neutrophils Relative %: 63 %
PLATELETS: 137 10*3/uL — AB (ref 150–400)
RBC: 5.04 MIL/uL (ref 4.22–5.81)
RDW: 12.9 % (ref 11.5–15.5)
WBC: 4.9 10*3/uL (ref 4.0–10.5)
nRBC: 0 % (ref 0.0–0.2)

## 2018-12-03 LAB — LACTATE DEHYDROGENASE: LDH: 207 U/L — ABNORMAL HIGH (ref 98–192)

## 2018-12-03 NOTE — Telephone Encounter (Signed)
Contacted by Wells Guiles at [facility] [phone]). Results from biopsy 12/6 not available yet. She will fax as soon as she has them. She sent disc of patient scans to office via [employer], should arrive today 12/12.

## 2018-12-03 NOTE — Telephone Encounter (Signed)
Called patient to inform that chemo education class is tomorrow at 2pm at [facility]. Patient's spouse verbalized understanding.

## 2018-12-04 ENCOUNTER — Inpatient Hospital Stay: Payer: No Typology Code available for payment source

## 2018-12-04 ENCOUNTER — Other Ambulatory Visit: Payer: Self-pay | Admitting: Hematology

## 2018-12-04 ENCOUNTER — Telehealth: Payer: Self-pay | Admitting: *Deleted

## 2018-12-04 DIAGNOSIS — R590 Localized enlarged lymph nodes: Secondary | ICD-10-CM

## 2018-12-04 DIAGNOSIS — C8590 Non-Hodgkin lymphoma, unspecified, unspecified site: Secondary | ICD-10-CM

## 2018-12-04 NOTE — Telephone Encounter (Signed)
Per Dr. Irene Limbo, cancelled admission to hospital on Monday 12/16, sent email to unit. [facility] Surgery at Dr. Grier Mitts request to obtain appt for patient. Scheduler took information, will contact Dr. Darrel Hoover nurse to follow up on Monday 12/16

## 2018-12-07 ENCOUNTER — Inpatient Hospital Stay: Admission: RE | Admit: 2018-12-07 | Payer: Non-veteran care | Source: Ambulatory Visit | Admitting: Hematology

## 2018-12-08 ENCOUNTER — Inpatient Hospital Stay
Admission: RE | Admit: 2018-12-08 | Discharge: 2018-12-08 | Disposition: A | Discharge status: Home / Self Care | Payer: Self-pay | Source: Ambulatory Visit | Attending: Hematology | Admitting: Hematology

## 2018-12-08 ENCOUNTER — Other Ambulatory Visit: Payer: Self-pay | Admitting: General Surgery

## 2018-12-08 ENCOUNTER — Other Ambulatory Visit: Payer: Self-pay | Admitting: Hematology

## 2018-12-08 DIAGNOSIS — G8191 Hemiplegia, unspecified affecting right dominant side: Secondary | ICD-10-CM | POA: Diagnosis not present

## 2018-12-08 DIAGNOSIS — Z8719 Personal history of other diseases of the digestive system: Secondary | ICD-10-CM | POA: Diagnosis not present

## 2018-12-08 DIAGNOSIS — C8510 Unspecified B-cell lymphoma, unspecified site: Secondary | ICD-10-CM | POA: Diagnosis not present

## 2018-12-08 DIAGNOSIS — Z86718 Personal history of other venous thrombosis and embolism: Secondary | ICD-10-CM | POA: Diagnosis not present

## 2018-12-08 DIAGNOSIS — C801 Malignant (primary) neoplasm, unspecified: Secondary | ICD-10-CM

## 2018-12-08 DIAGNOSIS — Z9889 Other specified postprocedural states: Secondary | ICD-10-CM | POA: Diagnosis not present

## 2018-12-08 DIAGNOSIS — Z8709 Personal history of other diseases of the respiratory system: Secondary | ICD-10-CM | POA: Diagnosis not present

## 2018-12-09 ENCOUNTER — Telehealth: Payer: Self-pay | Admitting: *Deleted

## 2018-12-09 ENCOUNTER — Encounter (HOSPITAL_COMMUNITY): Payer: Self-pay | Admitting: *Deleted

## 2018-12-09 ENCOUNTER — Telehealth: Payer: Self-pay

## 2018-12-09 NOTE — Telephone Encounter (Signed)
Calling to verify if appointments for previously scheduled injections following previously scheduled chemo should be cancelled since he is having a biopsy done Friday. Also wants to know if he should still come to see Dr. Irene Limbo on Monday as scheduled.

## 2018-12-09 NOTE — Pre-Procedure Instructions (Signed)
   Ethan Stewart  12/09/2018     [facility] [address] [place] [address] [place] [place] [address] Phone: [phone] Fax: [phone]  [id number] - [place], [place] - [address] [place] [address] Phone: [phone] Fax: [phone]   Your procedure is scheduled on Friday, December 11, 2018  Report to [facility] Admitting at 5:30 A.M.  Call this number if you have problems the morning of surgery:  [phone]   Remember:  Do not eat or drink after midnight.  You may drink clear liquids until 4:30 A.M.  Clear liquids allowed are:                    Water, Juice (non-citric and without pulp), Carbonated beverages, Clear Tea, Black Coffee only, Plain Jell-O only, Gatorade and Plain Popsicles only   Take these medicines the morning of surgery with A SIP OF WATER : levothyroxine (SYNTHROID, LEVOTHROID) Stop taking Aspirin, vitamins, fish oil and herbal medications. Do not take any NSAIDs ie: Ibuprofen, Advil, Naproxen (Aleve), Motrin, BC and Goody Powder; stop now.  Do not wear jewelry, make-up or nail polish.  Do not wear lotions, powders, or perfumes, or deodorant.  Do not shave 48 hours prior to surgery.  Men may shave face and neck.  Do not bring valuables to the hospital.  [facility] is not responsible for any belongings or valuables.             Brush your teeth the morning of surgery. Contacts, dentures or bridgework may not be worn into surgery.   Patients discharged the day of surgery will not be allowed to drive home.  Special instructions: See " -Preparing For Surgery " sheet. Please read over the following fact sheets that you were given. Pain Booklet, Coughing and Deep Breathing and Surgical Site Infection Prevention

## 2018-12-09 NOTE — Telephone Encounter (Signed)
Patient is aware of inj. Appointments being canceled per [facility] request, and he will be here on Mon. 23rd @ 8:30. Per 12/18 follow up

## 2018-12-09 NOTE — Telephone Encounter (Signed)
Contacted patient per Dr. Irene Limbo request as MD wants to move appts from 12/23 to 12/26 in order to have results of biopsy. Patient in agreement. Message sent to Scheduling.

## 2018-12-10 ENCOUNTER — Encounter (HOSPITAL_COMMUNITY)
Admission: RE | Admit: 2018-12-10 | Discharge: 2018-12-10 | Disposition: A | Discharge status: Home / Self Care | Payer: No Typology Code available for payment source | Source: Ambulatory Visit | Attending: General Surgery | Admitting: General Surgery

## 2018-12-10 ENCOUNTER — Encounter (HOSPITAL_COMMUNITY): Payer: Self-pay

## 2018-12-10 ENCOUNTER — Telehealth: Payer: Self-pay | Admitting: Hematology

## 2018-12-10 ENCOUNTER — Other Ambulatory Visit: Payer: Self-pay

## 2018-12-10 DIAGNOSIS — Z01818 Encounter for other preprocedural examination: Secondary | ICD-10-CM | POA: Diagnosis present

## 2018-12-10 HISTORY — DX: Acute respiratory distress syndrome: J80

## 2018-12-10 HISTORY — DX: Hypothyroidism, unspecified: E03.9

## 2018-12-10 NOTE — H&P (Signed)
Ethan Stewart Location: [facility] Surgery Patient #: [id number] DOB: 29-Nov-1946 Married / Language: English / Race: White Male      History of Present Illness     . This is a very pleasant 72 year old man, referred by Dr.Kale for lymph node biopsy to subcatigorize and clarify his lymphoma. Myriam Jacobson is his PCP. In 2016 he was diagnosed with large B-cell lymphoma following subtotal resection of a C5 lesion with corpectomy. In March 2017 PET scan was positive for abdominal lymph nodes and needle biopsy was negative at [facility]. He was followed for some time but nothing ever progressed. In November 2019 he developed right groin mass.Dr. Irene Limbo was out-of-town so he went back to [facility]. Video needle core biopsy of his right inguinal lymph node which showed some type of lymphoma. Port-A-Cath was inserted. There were considering stem cell transplant. Dr. Irene Limbo requests more tissue to clarify his type of lymphoma.     PET/CT in November 2019 [facility] revealed interval development of multistation lymphadenopathy in the abdomen and pelvis. There is a possibility this could be a lower grade lymphoma such as follicular lymphoma. Right inguinal adenopathy was noted and he was referred for right inguinal lymph node biopsy. Past history reveals large diffuse B-cell lymphoma stage IV with involvement of C5.  Lower extremity distal DVT. He completed any coagulation March 2017. G-CSF rotary capillary leak syndrome, ARDS and ventilator dependence No history of cardiac or vascular disease other than the DVT.    On exam his only significant adenopathy is in the right groin. There is significant adenopathy there and this will likely clarify his diagnosis       He'll be scheduled for excisional biopsy of deep right inguinal lymph node under general anesthesia in the near future advised him and his wife this may be a partial lymph node excision leaving a raw surface of lymph node that may  increase his risk for wound infection, seroma, or lymphocele. I discussed the indications, details, techniques, and numerous risk of the surgery with him.     Risk of bleeding, infection, seroma, skin necrosis, nerve damage with chronic pain or numbness. He is aware of these issues. All his questions were answered. He agrees with this plan.     Allergies  Pegfilgrastim *HEMATOPOIETIC AGENTS*  Allergies Reconciled   Medication History Levothyroxine Sodium (50MCG Tablet, Oral) Active. Simvastatin (20MG  Tablet, Oral) Active. Restoril (15MG  Capsule, Oral) Active. DiazePAM (2MG  Tablet, Oral) Active. Medications Reconciled  Vitals  Weight: 228.4 lb Height: 72in Body Surface Area: 2.25 m Body Mass Index: 30.98 kg/m  Temp.: 97.67F(Temporal)  Pulse: 79 (Regular)  P.OX: 97% (Room air) BP: 158/80 (Sitting, Left Arm, Standard)       Physical Exam  General Mental Status-Alert. General Appearance-Consistent with stated age. Hydration-Well hydrated. Voice-Normal.  Head and Neck Head-normocephalic, atraumatic with no lesions or palpable masses. Trachea-midline. Thyroid Gland Characteristics - normal size and consistency.  Eye Eyeball - Bilateral-Extraocular movements intact. Sclera/Conjunctiva - Bilateral-No scleral icterus.  Chest and Lung Exam Chest and lung exam reveals -quiet, even and easy respiratory effort with no use of accessory muscles and on auscultation, normal breath sounds, no adventitious sounds and normal vocal resonance. Inspection Chest Wall - Normal. Back - normal. Note: Port catheter right infraclavicular area   Cardiovascular Cardiovascular examination reveals -normal heart sounds, regular rate and rhythm with no murmurs and normal pedal pulses bilaterally.  Abdomen Inspection Inspection of the abdomen reveals - No Hernias. Skin - Scar -  no surgical scars. Palpation/Percussion Palpation and Percussion of  the abdomen reveal - Soft, Non Tender, No Rebound tenderness, No Rigidity (guarding) and No hepatosplenomegaly. Auscultation Auscultation of the abdomen reveals - Bowel sounds normal. Note: Liver and spleen not palpable   Neurologic Neurologic evaluation reveals -alert and oriented x 3 with no impairment of recent or remote memory. Mental Status-Normal.  Musculoskeletal Normal Exam - Left-Upper Extremity Strength Normal and Lower Extremity Strength Normal. Normal Exam - Right-Upper Extremity Strength Normal and Lower Extremity Strength Normal.  Lymphatic Note: No occipital, cervical or supraclavicular adenopathy. No axillary adenopathy. Medium bulky but somewhat mobile adenopathy right inguinal area. This may extend up along the iliacs. Tiny lymph nodes left groin.     Assessment & Plan  LARGE B-CELL LYMPHOMA (C85.10) Current Plans You are being scheduled for surgery- Our schedulers will call you. You should hear from our office's scheduling department within 5 working days about the location, date, and time of surgery. We try to make accommodations for patient's preferences in scheduling surgery, but sometimes the OR schedule or the surgeon's schedule prevents [place] from making those accommodations. If you have not heard from our office [phone]) in 5 working days, call the office and ask for your surgeon's nurse. If you have other questions about your diagnosis, plan, or surgery, call the office and ask for your surgeon's nurse.   It appears that you have recurrent lymphoma Needle biopsy of right inguinal lymph node at [facility] configuration but there was not enough tissue to subtype it Dr. Irene Limbo wants a lymph node biopsy to clarify your diagnosis and I agree You already have a Port-A-Cath  you will be scheduled for excisional biopsy right inguinal lymph node under general anesthesia in the near future I discussed the indications, techniques, and risks of the  surgery in detail with you and your wife  HISTORY OF DVT (DEEP VEIN THROMBOSIS) (Z86.718) HEMIPARESIS, RIGHT (G81.91) HISTORY OF LEFT INGUINAL HERNIA REPAIR (Z98.890) HISTORY OF ARDS (Z87.09)    Ethan Stewart M. Dalbert Batman, M.D., [facility] General and Minimally invasive Surgery Breast and Colorectal Surgery Office:   [phone] Pager:   [phone]

## 2018-12-10 NOTE — Pre-Procedure Instructions (Signed)
Ethan Stewart  12/10/2018     [facility] [address] [place] [address] [place] [place] [address] Phone: [phone] Fax: [phone]  [id number] - [place], [place] - [address] [place] [address] Phone: [phone] Fax: [phone]   Your procedure is scheduled on Friday, December 11, 2018   Report to [facility] Admitting at 5:30 A.M.             (posted surgery time 7:30a -8:30a)   Call this number if you have problems the morning of surgery:  [phone]   Remember:   Do not eat any foods  after midnight, Thursday.   You may drink clear liquids until 4:30 A.M.  Clear liquids allowed are:  Water, Juice (non-citric and without pulp), Carbonated beverages, Clear Tea, Black Coffee only, Plain Jell-O only, Gatorade and Plain Popsicles only   Take these medicines the morning of surgery with A SIP OF WATER : levothyroxine (SYNTHROID, LEVOTHROID)  Stop taking Aspirin, vitamins, fish oil and herbal medications. Do not take any NSAIDs ie: Ibuprofen, Advil, Naproxen (Aleve), Motrin, BC and Goody Powder; stop now.   Do not wear jewelry - NO RINGS  Do not wear lotions, colognes or deodorant.  Men may shave face and neck.   Do not bring valuables to the hospital.   [facility] is not responsible for any belongings or valuables.             Brush your teeth the morning of surgery.  Contacts, dentures or bridgework may not be worn into surgery.    Patients discharged the day of surgery will not be allowed to drive home, and will need someone to stay with you for the first 24 hrs.          [facility]- Preparing For Surgery  Before surgery, you can play an important role. Because skin is not sterile, your skin needs to be as free of germs as possible. You can reduce the number of germs on your skin by washing with CHG (chlorahexidine gluconate)  Soap before surgery.  CHG is an antiseptic cleaner which kills germs and bonds with the skin to continue killing germs even after washing.    Oral Hygiene is also important to reduce your risk of infection.   Remember - BRUSH YOUR TEETH THE MORNING OF SURGERY WITH YOUR REGULAR TOOTHPASTE  Please do not use if you have an allergy to CHG or antibacterial soaps. If your skin becomes reddened/irritated stop using the CHG.  Do not shave (including legs and underarms) for at least 48 hours prior to first CHG shower. It is OK to shave your face.  Please follow these instructions carefully.   1. Shower the NIGHT BEFORE SURGERY and the MORNING OF SURGERY with CHG.   2. If you chose to wash your hair, wash your hair first as usual with your normal shampoo.  3. After you shampoo, rinse your hair and body thoroughly to remove the shampoo.  4. Use CHG as you would any other liquid soap. You can apply CHG directly to the skin and wash gently with a scrungie or a clean washcloth.   5. Apply the CHG Soap to your body ONLY FROM THE NECK DOWN.  Do not use on open wounds or open sores. Avoid contact with your eyes, ears, mouth and genitals (private parts). Wash Face and genitals (private parts)  with your normal soap.  6. Wash thoroughly, paying special attention to the area where your surgery will be performed.  7. Thoroughly rinse your body with warm water from the neck down.  8. DO NOT shower/wash with your normal soap after using and rinsing off the CHG Soap.  9. Pat yourself dry with a CLEAN TOWEL.  10. Wear CLEAN PAJAMAS to bed the night before surgery, wear comfortable clothes the morning of surgery  11. Place CLEAN SHEETS on your bed the night of your first shower and DO NOT SLEEP WITH PETS.  Day of Surgery:  Do not apply any deodorants/lotions.  Please wear clean clothes to the hospital/surgery center.    Remember to brush your teeth WITH YOUR REGULAR TOOTHPASTE.  PLEASE RE-READ OVER  THIS INFORMATION

## 2018-12-10 NOTE — Anesthesia Preprocedure Evaluation (Addendum)
Anesthesia Evaluation  Patient identified by MRN, date of birth, ID band Patient awake    Reviewed: Allergy & Precautions, NPO status , Patient's Chart, lab work & pertinent test results  Airway Mallampati: II  TM Distance: >3 FB Neck ROM: Full    Dental   Pulmonary former smoker,    breath sounds clear to auscultation       Cardiovascular negative cardio ROS   Rhythm:Regular Rate:Normal     Neuro/Psych Anxiety Depression negative neurological ROS     GI/Hepatic Neg liver ROS, GERD  ,  Endo/Other  Hypothyroidism   Renal/GU negative Renal ROS     Musculoskeletal   Abdominal   Peds  Hematology negative hematology ROS (+)   Anesthesia Other Findings   Reproductive/Obstetrics                            Lab Results  Component Value Date   WBC 4.9 12/03/2018   HGB 15.3 12/03/2018   HCT 44.8 12/03/2018   MCV 88.9 12/03/2018   PLT 137 (L) 12/03/2018   Lab Results  Component Value Date   CREATININE 1.08 12/03/2018   BUN 18 12/03/2018   NA 144 12/03/2018   K 4.4 12/03/2018   CL 107 12/03/2018   CO2 27 12/03/2018    Anesthesia Physical Anesthesia Plan  ASA: II  Anesthesia Plan: General   Post-op Pain Management:    Induction: Intravenous  PONV Risk Score and Plan: 2 and Dexamethasone, Ondansetron and Treatment may vary due to age or medical condition  Airway Management Planned: LMA  Additional Equipment:   Intra-op Plan:   Post-operative Plan: Extubation in OR  Informed Consent: I have reviewed the patients History and Physical, chart, labs and discussed the procedure including the risks, benefits and alternatives for the proposed anesthesia with the patient or authorized representative who has indicated his/her understanding and acceptance.   Dental advisory given  Plan Discussed with: CRNA  Anesthesia Plan Comments:        Anesthesia Quick Evaluation

## 2018-12-10 NOTE — Progress Notes (Addendum)
PCP is Dr. Cristi Loron   LOV 10/2018 Oncologist is Dr. Irene Limbo  LOV 11/2018 Denies murmur, cp, sob. He states, back in 2016 after receiving Neulasta, he developed ARDS. Also,  during the 2016 cancer treatment, developed a DVT behind right knee.  Was placed on Eliquis x 3 mths then.  Takes none now. Echo 2016 EKG 2017 CBC w/diff & CMet done 12/03/2018 at [facility].

## 2018-12-10 NOTE — Telephone Encounter (Signed)
Scheduled appt per 12/18 sch message - pt is aware of appt date time

## 2018-12-11 ENCOUNTER — Ambulatory Visit: Payer: Self-pay

## 2018-12-11 ENCOUNTER — Ambulatory Visit (HOSPITAL_COMMUNITY): Payer: No Typology Code available for payment source | Admitting: Anesthesiology

## 2018-12-11 ENCOUNTER — Ambulatory Visit (HOSPITAL_COMMUNITY): Payer: No Typology Code available for payment source | Admitting: Physician Assistant

## 2018-12-11 ENCOUNTER — Ambulatory Visit (HOSPITAL_COMMUNITY)
Admission: RE | Admit: 2018-12-11 | Discharge: 2018-12-11 | Disposition: A | Discharge status: Home / Self Care | Payer: No Typology Code available for payment source | Attending: General Surgery | Admitting: General Surgery

## 2018-12-11 ENCOUNTER — Encounter (HOSPITAL_COMMUNITY)
Admission: RE | Disposition: A | Discharge status: Home / Self Care | Payer: Self-pay | Source: Home / Self Care | Attending: General Surgery

## 2018-12-11 DIAGNOSIS — C8330 Diffuse large B-cell lymphoma, unspecified site: Secondary | ICD-10-CM | POA: Diagnosis present

## 2018-12-11 DIAGNOSIS — Z9911 Dependence on respirator [ventilator] status: Secondary | ICD-10-CM | POA: Diagnosis not present

## 2018-12-11 DIAGNOSIS — Z8679 Personal history of other diseases of the circulatory system: Secondary | ICD-10-CM | POA: Insufficient documentation

## 2018-12-11 DIAGNOSIS — I789 Disease of capillaries, unspecified: Secondary | ICD-10-CM | POA: Diagnosis not present

## 2018-12-11 DIAGNOSIS — C8510 Unspecified B-cell lymphoma, unspecified site: Secondary | ICD-10-CM | POA: Diagnosis present

## 2018-12-11 DIAGNOSIS — C8285 Other types of follicular lymphoma, lymph nodes of inguinal region and lower limb: Secondary | ICD-10-CM | POA: Diagnosis not present

## 2018-12-11 DIAGNOSIS — Z79899 Other long term (current) drug therapy: Secondary | ICD-10-CM | POA: Diagnosis not present

## 2018-12-11 DIAGNOSIS — C8200 Follicular lymphoma grade I, unspecified site: Secondary | ICD-10-CM | POA: Diagnosis not present

## 2018-12-11 DIAGNOSIS — Z888 Allergy status to other drugs, medicaments and biological substances status: Secondary | ICD-10-CM | POA: Diagnosis not present

## 2018-12-11 DIAGNOSIS — E876 Hypokalemia: Secondary | ICD-10-CM | POA: Diagnosis not present

## 2018-12-11 DIAGNOSIS — C8205 Follicular lymphoma grade I, lymph nodes of inguinal region and lower limb: Secondary | ICD-10-CM | POA: Diagnosis not present

## 2018-12-11 DIAGNOSIS — Z86718 Personal history of other venous thrombosis and embolism: Secondary | ICD-10-CM | POA: Insufficient documentation

## 2018-12-11 DIAGNOSIS — J9621 Acute and chronic respiratory failure with hypoxia: Secondary | ICD-10-CM | POA: Diagnosis not present

## 2018-12-11 DIAGNOSIS — E039 Hypothyroidism, unspecified: Secondary | ICD-10-CM | POA: Diagnosis not present

## 2018-12-11 DIAGNOSIS — C8335 Diffuse large B-cell lymphoma, lymph nodes of inguinal region and lower limb: Secondary | ICD-10-CM | POA: Diagnosis not present

## 2018-12-11 HISTORY — PX: LYMPH NODE BIOPSY: SHX201

## 2018-12-11 HISTORY — DX: Unspecified B-cell lymphoma, unspecified site: C85.10

## 2018-12-11 SURGERY — LYMPH NODE BIOPSY
Anesthesia: General | Site: Groin | Laterality: Right

## 2018-12-11 MED ORDER — CEFAZOLIN SODIUM-DEXTROSE 2-4 GM/100ML-% IV SOLN
INTRAVENOUS | Status: AC
  Filled 2018-12-11: qty 100

## 2018-12-11 MED ORDER — ONDANSETRON HCL 4 MG/2ML IJ SOLN
INTRAMUSCULAR | Status: DC | PRN
  Administered 2018-12-11: 4 mg via INTRAVENOUS

## 2018-12-11 MED ORDER — 0.9 % SODIUM CHLORIDE (POUR BTL) OPTIME
TOPICAL | Status: DC | PRN
  Administered 2018-12-11: 1000 mL

## 2018-12-11 MED ORDER — CHLORHEXIDINE GLUCONATE CLOTH 2 % EX PADS: 6.0000 | MEDICATED_PAD | Freq: Once | CUTANEOUS | Status: DC

## 2018-12-11 MED ORDER — ONDANSETRON HCL 4 MG/2ML IJ SOLN
INTRAMUSCULAR | Status: AC
  Filled 2018-12-11: qty 2

## 2018-12-11 MED ORDER — EVICEL 2 ML EX KIT
PACK | CUTANEOUS | Status: AC
  Filled 2018-12-11: qty 1

## 2018-12-11 MED ORDER — GABAPENTIN 300 MG PO CAPS: 300.0000 mg | ORAL_CAPSULE | ORAL | Status: DC

## 2018-12-11 MED ORDER — PROPOFOL 10 MG/ML IV BOLUS
INTRAVENOUS | Status: DC | PRN
  Administered 2018-12-11: 150 mg via INTRAVENOUS

## 2018-12-11 MED ORDER — HYDROCODONE-ACETAMINOPHEN 5-325 MG PO TABS: 1.0000 | ORAL_TABLET | Freq: Four times a day (QID) | ORAL | 0 refills | Status: DC | PRN

## 2018-12-11 MED ORDER — BUPIVACAINE-EPINEPHRINE 0.25% -1:200000 IJ SOLN
INTRAMUSCULAR | Status: DC | PRN
  Administered 2018-12-11: 30 mL

## 2018-12-11 MED ORDER — FENTANYL CITRATE (PF) 250 MCG/5ML IJ SOLN
INTRAMUSCULAR | Status: AC
  Filled 2018-12-11: qty 5

## 2018-12-11 MED ORDER — BUPIVACAINE-EPINEPHRINE (PF) 0.25% -1:200000 IJ SOLN
INTRAMUSCULAR | Status: AC
  Filled 2018-12-11: qty 30

## 2018-12-11 MED ORDER — LIDOCAINE 2% (20 MG/ML) 5 ML SYRINGE
INTRAMUSCULAR | Status: DC | PRN
  Administered 2018-12-11: 40 mg via INTRAVENOUS

## 2018-12-11 MED ORDER — PROPOFOL 10 MG/ML IV BOLUS
INTRAVENOUS | Status: AC
  Filled 2018-12-11: qty 40

## 2018-12-11 MED ORDER — GABAPENTIN 300 MG PO CAPS
ORAL_CAPSULE | ORAL | Status: AC
  Administered 2018-12-11: 300 mg
  Filled 2018-12-11: qty 1

## 2018-12-11 MED ORDER — EVICEL 5 ML EX KIT
PACK | CUTANEOUS | Status: AC
  Filled 2018-12-11: qty 1

## 2018-12-11 MED ORDER — ACETAMINOPHEN 500 MG PO TABS
1000.0000 mg | ORAL_TABLET | ORAL | Status: AC
  Administered 2018-12-11: 1000 mg via ORAL

## 2018-12-11 MED ORDER — DEXAMETHASONE SODIUM PHOSPHATE 10 MG/ML IJ SOLN
INTRAMUSCULAR | Status: AC
  Filled 2018-12-11: qty 1

## 2018-12-11 MED ORDER — ACETAMINOPHEN 500 MG PO TABS
ORAL_TABLET | ORAL | Status: AC
  Administered 2018-12-11: 500 mg
  Filled 2018-12-11: qty 2

## 2018-12-11 MED ORDER — FENTANYL CITRATE (PF) 250 MCG/5ML IJ SOLN
INTRAMUSCULAR | Status: DC | PRN
  Administered 2018-12-11 (×2): 25 ug via INTRAVENOUS

## 2018-12-11 MED ORDER — EPHEDRINE SULFATE-NACL 50-0.9 MG/10ML-% IV SOSY
PREFILLED_SYRINGE | INTRAVENOUS | Status: DC | PRN
  Administered 2018-12-11: 5 mg via INTRAVENOUS

## 2018-12-11 MED ORDER — EVICEL 5 ML EX KIT
PACK | CUTANEOUS | Status: DC | PRN
  Administered 2018-12-11: 5 mL via TOPICAL

## 2018-12-11 MED ORDER — CEFAZOLIN SODIUM-DEXTROSE 2-4 GM/100ML-% IV SOLN
2.0000 g | INTRAVENOUS | Status: AC
  Administered 2018-12-11: 2 g via INTRAVENOUS

## 2018-12-11 MED ORDER — MIDAZOLAM HCL 2 MG/2ML IJ SOLN
INTRAMUSCULAR | Status: AC
  Filled 2018-12-11: qty 2

## 2018-12-11 MED ORDER — MIDAZOLAM HCL 5 MG/5ML IJ SOLN
INTRAMUSCULAR | Status: DC | PRN
  Administered 2018-12-11: 2 mg via INTRAVENOUS

## 2018-12-11 MED ORDER — LACTATED RINGERS IV SOLN
INTRAVENOUS | Status: DC | PRN
  Administered 2018-12-11: 07:00:00 via INTRAVENOUS

## 2018-12-11 SURGICAL SUPPLY — 40 items
ADH SKN CLS APL DERMABOND .7 (GAUZE/BANDAGES/DRESSINGS) ×1
APPLIER CLIP 9.375 MED OPEN (MISCELLANEOUS) ×2
APR CLP MED 9.3 20 MLT OPN (MISCELLANEOUS) ×1
BLADE CLIPPER SURG (BLADE) ×1 IMPLANT
CHLORAPREP W/TINT 26ML (MISCELLANEOUS) ×2 IMPLANT
CLIP APPLIE 9.375 MED OPEN (MISCELLANEOUS) IMPLANT
CONT SPEC 4OZ CLIKSEAL STRL BL (MISCELLANEOUS) ×2 IMPLANT
COVER SURGICAL LIGHT HANDLE (MISCELLANEOUS) ×2 IMPLANT
COVER WAND RF STERILE (DRAPES) ×2 IMPLANT
DECANTER SPIKE VIAL GLASS SM (MISCELLANEOUS) ×2 IMPLANT
DERMABOND ADVANCED (GAUZE/BANDAGES/DRESSINGS) ×1
DERMABOND ADVANCED .7 DNX12 (GAUZE/BANDAGES/DRESSINGS) ×1 IMPLANT
DRAPE LAPAROTOMY 100X72 PEDS (DRAPES) ×2 IMPLANT
DRAPE UTILITY XL STRL (DRAPES) ×1 IMPLANT
ELECT CAUTERY BLADE 6.4 (BLADE) ×2 IMPLANT
ELECT REM PT RETURN 9FT ADLT (ELECTROSURGICAL) ×2
ELECTRODE REM PT RTRN 9FT ADLT (ELECTROSURGICAL) ×1 IMPLANT
GLOVE BIOGEL PI IND STRL 8 (GLOVE) IMPLANT
GLOVE BIOGEL PI INDICATOR 8 (GLOVE) ×1
GLOVE EUDERMIC 7 POWDERFREE (GLOVE) ×2 IMPLANT
GOWN STRL REUS W/ TWL LRG LVL3 (GOWN DISPOSABLE) ×1 IMPLANT
GOWN STRL REUS W/ TWL XL LVL3 (GOWN DISPOSABLE) ×1 IMPLANT
GOWN STRL REUS W/TWL LRG LVL3 (GOWN DISPOSABLE) ×2
GOWN STRL REUS W/TWL XL LVL3 (GOWN DISPOSABLE) ×2
KIT BASIN OR (CUSTOM PROCEDURE TRAY) ×2 IMPLANT
KIT TURNOVER KIT B (KITS) ×2 IMPLANT
NDL HYPO 25GX1X1/2 BEV (NEEDLE) ×1 IMPLANT
NEEDLE HYPO 25GX1X1/2 BEV (NEEDLE) ×2 IMPLANT
NS IRRIG 1000ML POUR BTL (IV SOLUTION) ×2 IMPLANT
PACK GENERAL/GYN (CUSTOM PROCEDURE TRAY) ×1 IMPLANT
PACK SURGICAL SETUP 50X90 (CUSTOM PROCEDURE TRAY) ×1 IMPLANT
PAD ARMBOARD 7.5X6 YLW CONV (MISCELLANEOUS) ×2 IMPLANT
PENCIL BUTTON HOLSTER BLD 10FT (ELECTRODE) ×1 IMPLANT
SPONGE LAP 4X18 RFD (DISPOSABLE) ×2 IMPLANT
SUT MON AB 4-0 PC3 18 (SUTURE) ×2 IMPLANT
SUT VIC AB 3-0 SH 18 (SUTURE) ×2 IMPLANT
SYR BULB 3OZ (MISCELLANEOUS) ×1 IMPLANT
SYR CONTROL 10ML LL (SYRINGE) ×2 IMPLANT
TOWEL OR 17X24 6PK STRL BLUE (TOWEL DISPOSABLE) ×2 IMPLANT
TOWEL OR 17X26 10 PK STRL BLUE (TOWEL DISPOSABLE) ×1 IMPLANT

## 2018-12-11 NOTE — Interval H&P Note (Signed)
History and Physical Interval Note:  12/11/2018 6:08 AM  Ethan Stewart  has presented today for surgery, with the diagnosis of Large B-Cell Lymphoma  The various methods of treatment have been discussed with the patient and family. After consideration of risks, benefits and other options for treatment, the patient has consented to  Procedure(s): EXCISIONAL BIOPSY OF DEEP RIGHT INGUINAL LYMPH NODE ERAS PATHWAY (N/A) as a surgical intervention .  The patient's history has been reviewed, patient examined, no change in status, stable for surgery.  I have reviewed the patient's chart and labs.  Questions were answered to the patient's satisfaction.     Adin Hector

## 2018-12-11 NOTE — Op Note (Signed)
Patient Name:           Ethan Stewart   Date of Surgery:        12/11/2018  Pre op Diagnosis:      Recurrent lymphoma  Post op Diagnosis:    Same  Procedure:                 Excision deep right inguinal lymph node  Surgeon:                     Edsel Petrin. Dalbert Batman, M.D., FACS  Assistant:                      OR staff  Operative Indications:   This is a very pleasant 72 year old man, referred by Dr.Kale for lymph node biopsy to subcatigorize and clarify his lymphoma. Myriam Jacobson is his PCP. In 2016 he was diagnosed with large B-cell lymphoma following subtotal resection of a C5 lesion with corpectomy. In March 2017 PET scan was positive for abdominal lymph nodes and needle biopsy was negative at [facility]. He was followed for some time but nothing ever progressed. In November 2019 he developed right groin mass.Dr. Irene Limbo was out-of-town so he went back to [facility]. Image Guided needle core biopsy of his right inguinal lymph node which showed some type of lymphoma. Port-A-Cath was inserted. There were considering stem cell transplant. Dr. Irene Limbo requests more tissue to clarify and sub-type his lymphoma.     PET/CT in November 2019 [facility] revealed interval development of multistation lymphadenopathy in the abdomen and pelvis. There is a possibility this could be a lower grade lymphoma such as follicular lymphoma. Right inguinal adenopathy was noted and he was referred for right inguinal lymph node biopsy. Past history reveals large diffuse B-cell lymphoma stage IV with involvement of C5.    Lower extremity distal DVT. He completed anticoagulation March 2017.    G-CSF  capillary leak syndrome, ARDS and ventilator dependence No history of cardiac or vascular disease other than the DVT.    On exam his only significant adenopathy is in the right groin. There is significant adenopathy there and this will likely clarify his diagnosis       He'll be scheduled for excisional biopsy of  deep right inguinal lymph node under general anesthesia in the near future He agrees with this plan.  Operative Findings:       I removed a 3 cm lymph node from his right inguinal area.  This was actually at and somewhat below the right inguinal ligament overlying the femoral triangle.  The lymph node came out fairly cleanly with only small amounts of residual lymphoid tissue adherent to the femoral triangle.  This was sent fresh to the lab for lymphoma work-up as a rush.  Procedure in Detail:          Following the induction of general LMA anesthesia the patient's lower abdomen right groin and genitalia were prepped and draped in a sterile fashion.  Surgical timeout was performed.  Intravenous antibiotics were given.  0.25% Marcaine with epinephrine was used as local infiltration anesthetic.      A linear incision was made in the right groin just above the inguinal crease.  Dissection was carried down through the subcutaneous tissue and Scarpa's fascia.  We then encountered a is somewhat firm lymph node that had lots of attachments.  This was slowly dissected away from the surrounding tissues.  Lymphatic and  venous channels were controlled with metal clips and divided.  We sent the specimen to the lab urgently.  The wound was irrigated.  We cauterized all bleeders and it was very  dry.  Evicel  tissue sealant was placed on the small raw areas of lymphatic tissue and allowed to dry.  Subcutaneous tissue and Scarpa's fascia were closed with interrupted 3-0 Vicryl and the skin closed with a running subcuticular 4-0 Monocryl and Dermabond.  The patient tolerated the procedure well and was taken to PACU in stable condition.  EBL 15 cc.  Counts correct.  Complications none   Addendum: I logged onto the [employer] and reviewed his prescription medication history     Jasai Sorg M. Dalbert Batman, M.D., FACS General and Minimally Invasive Surgery Breast and Colorectal Surgery  12/11/2018 8:26 AM

## 2018-12-11 NOTE — Anesthesia Procedure Notes (Signed)
Procedure Name: LMA Insertion Date/Time: 12/11/2018 7:36 AM Performed by: Valda Favia, CRNA Pre-anesthesia Checklist: Patient identified, Emergency Drugs available, Suction available and Patient being monitored Patient Re-evaluated:Patient Re-evaluated prior to induction Oxygen Delivery Method: Circle System Utilized Preoxygenation: Pre-oxygenation with 100% oxygen Induction Type: IV induction Ventilation: Mask ventilation without difficulty LMA: LMA inserted LMA Size: 5.0 Number of attempts: 1 Airway Equipment and Method: Bite block Placement Confirmation: positive ETCO2 Tube secured with: Tape Dental Injury: Teeth and Oropharynx as per pre-operative assessment

## 2018-12-11 NOTE — Transfer of Care (Signed)
Immediate Anesthesia Transfer of Care Note  Patient: Ethan Stewart  Procedure(s) Performed: EXCISIONAL BIOPSY OF DEEP RIGHT INGUINAL LYMPH NODE ERAS PATHWAY (N/A )  Patient Location: PACU  Anesthesia Type:General  Level of Consciousness: drowsy  Airway & Oxygen Therapy: Patient Spontanous Breathing and Patient connected to nasal cannula oxygen  Post-op Assessment: Report given to RN and Post -op Vital signs reviewed and stable  Post vital signs: Reviewed and stable  Last Vitals:  Vitals Value Taken Time  BP 123/73 12/11/2018  8:29 AM  Temp 36.2 C 12/11/2018  8:29 AM  Pulse 62 12/11/2018  8:32 AM  Resp 13 12/11/2018  8:32 AM  SpO2 99 % 12/11/2018  8:32 AM  Vitals shown include unvalidated device data.  Last Pain:  Vitals:   12/11/18 0829  TempSrc:   PainSc: Asleep         Complications: No apparent anesthesia complications

## 2018-12-11 NOTE — Anesthesia Postprocedure Evaluation (Signed)
Anesthesia Post Note  Patient: JACOLBY RISBY  Procedure(s) Performed: EXCISIONAL BIOPSY OF DEEP RIGHT INGUINAL LYMPH NODE ERAS PATHWAY (Right Groin)     Patient location during evaluation: PACU Anesthesia Type: General Level of consciousness: awake and alert Pain management: pain level controlled Vital Signs Assessment: post-procedure vital signs reviewed and stable Respiratory status: spontaneous breathing, nonlabored ventilation, respiratory function stable and patient connected to nasal cannula oxygen Cardiovascular status: blood pressure returned to baseline and stable Postop Assessment: no apparent nausea or vomiting Anesthetic complications: no    Last Vitals:  Vitals:   12/11/18 0843 12/11/18 0858  BP: 129/73 108/65  Pulse: (!) 58 (!) 55  Resp: 12 13  Temp:  (!) 36.2 C  SpO2: 98% 100%    Last Pain:  Vitals:   12/11/18 0829  TempSrc:   PainSc: Tyler Deis

## 2018-12-11 NOTE — Discharge Instructions (Signed)
Stay in the house for 24 hours You may shower tomorrow but no tub baths for 3 weeks  Ice pack to wound for 10 minutes at a time, intermittently, for 24 to 36 hours while awake  Starting tomorrow take a slow  daily walk around the block if possible  For pain take Tylenol, 1000 mg every 6 hours for 36 hours I will call in a stronger pain medicine to your pharmacy if needed  Call Dr. Dalbert Batman if you develop any wound swelling, redness or infection

## 2018-12-12 ENCOUNTER — Ambulatory Visit: Payer: Self-pay

## 2018-12-12 ENCOUNTER — Encounter (HOSPITAL_COMMUNITY): Payer: Self-pay | Admitting: General Surgery

## 2018-12-14 ENCOUNTER — Other Ambulatory Visit: Payer: Self-pay

## 2018-12-14 ENCOUNTER — Ambulatory Visit: Payer: Self-pay | Admitting: Hematology

## 2018-12-14 ENCOUNTER — Ambulatory Visit: Payer: Self-pay

## 2018-12-15 ENCOUNTER — Other Ambulatory Visit: Payer: Self-pay | Admitting: *Deleted

## 2018-12-15 ENCOUNTER — Ambulatory Visit: Payer: Self-pay

## 2018-12-15 DIAGNOSIS — R590 Localized enlarged lymph nodes: Secondary | ICD-10-CM

## 2018-12-16 ENCOUNTER — Ambulatory Visit: Payer: Self-pay

## 2018-12-17 ENCOUNTER — Ambulatory Visit: Payer: Self-pay

## 2018-12-17 ENCOUNTER — Telehealth: Payer: Self-pay | Admitting: Hematology

## 2018-12-17 ENCOUNTER — Inpatient Hospital Stay: Payer: No Typology Code available for payment source

## 2018-12-17 ENCOUNTER — Inpatient Hospital Stay (HOSPITAL_BASED_OUTPATIENT_CLINIC_OR_DEPARTMENT_OTHER): Payer: No Typology Code available for payment source | Admitting: Hematology

## 2018-12-17 VITALS — BP 137/80 | HR 63 | Temp 97.8°F | Resp 18 | Ht 73.0 in | Wt 227.9 lb

## 2018-12-17 DIAGNOSIS — Z86718 Personal history of other venous thrombosis and embolism: Secondary | ICD-10-CM | POA: Diagnosis not present

## 2018-12-17 DIAGNOSIS — Z9221 Personal history of antineoplastic chemotherapy: Secondary | ICD-10-CM

## 2018-12-17 DIAGNOSIS — R590 Localized enlarged lymph nodes: Secondary | ICD-10-CM

## 2018-12-17 DIAGNOSIS — C8330 Diffuse large B-cell lymphoma, unspecified site: Secondary | ICD-10-CM

## 2018-12-17 DIAGNOSIS — C8338 Diffuse large B-cell lymphoma, lymph nodes of multiple sites: Secondary | ICD-10-CM | POA: Diagnosis not present

## 2018-12-17 DIAGNOSIS — D696 Thrombocytopenia, unspecified: Secondary | ICD-10-CM | POA: Diagnosis not present

## 2018-12-17 DIAGNOSIS — C8298 Follicular lymphoma, unspecified, lymph nodes of multiple sites: Secondary | ICD-10-CM

## 2018-12-17 LAB — CMP (CANCER CENTER ONLY)
ALT: 54 U/L — ABNORMAL HIGH (ref 0–44)
AST: 47 U/L — ABNORMAL HIGH (ref 15–41)
Albumin: 3.9 g/dL (ref 3.5–5.0)
Alkaline Phosphatase: 87 U/L (ref 38–126)
Anion gap: 7 (ref 5–15)
BUN: 20 mg/dL (ref 8–23)
CALCIUM: 9.4 mg/dL (ref 8.9–10.3)
CO2: 27 mmol/L (ref 22–32)
Chloride: 108 mmol/L (ref 98–111)
Creatinine: 1.15 mg/dL (ref 0.61–1.24)
GFR, Est AFR Am: >60 mL/min (ref >60)
GFR, Estimated: >60 mL/min (ref >60)
Glucose, Bld: 80 mg/dL (ref 70–99)
Potassium: 4.1 mmol/L (ref 3.5–5.1)
Sodium: 142 mmol/L (ref 135–145)
Total Bilirubin: 0.5 mg/dL (ref 0.3–1.2)
Total Protein: 6.7 g/dL (ref 6.5–8.1)

## 2018-12-17 LAB — CBC WITH DIFFERENTIAL (CANCER CENTER ONLY)
Abs Immature Granulocytes: 0.03 10*3/uL (ref 0.00–0.07)
Basophils Absolute: 0 10*3/uL (ref 0.0–0.1)
Basophils Relative: 0 %
EOS ABS: 0.1 10*3/uL (ref 0.0–0.5)
Eosinophils Relative: 2 %
HCT: 42.9 % (ref 39.0–52.0)
Hemoglobin: 14.6 g/dL (ref 13.0–17.0)
Immature Granulocytes: 1 %
Lymphocytes Relative: 22 %
Lymphs Abs: 1.1 10*3/uL (ref 0.7–4.0)
MCH: 30 pg (ref 26.0–34.0)
MCHC: 34 g/dL (ref 30.0–36.0)
MCV: 88.3 fL (ref 80.0–100.0)
Monocytes Absolute: 0.6 10*3/uL (ref 0.1–1.0)
Monocytes Relative: 12 %
Neutro Abs: 3.2 10*3/uL (ref 1.7–7.7)
Neutrophils Relative %: 63 %
Platelet Count: 129 10*3/uL — ABNORMAL LOW (ref 150–400)
RBC: 4.86 MIL/uL (ref 4.22–5.81)
RDW: 13.1 % (ref 11.5–15.5)
WBC Count: 5 10*3/uL (ref 4.0–10.5)
nRBC: 0 % (ref 0.0–0.2)

## 2018-12-17 LAB — LACTATE DEHYDROGENASE: LDH: 190 U/L (ref 98–192)

## 2018-12-17 NOTE — Progress Notes (Signed)
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 Cytogenetics and Fish showed BCL 2 positivity but negative for BCL 6 and cMYC CD10 positivity suggestive of possibly GCB subtype.  Patient received first cycle of R CHOP on 08/08/2015 and intrathecal methotrexate with hydrocortisone on 08/09/2015. Patient subsequently was admitted with ARDS likely due to neulasta -treated with high dose steroids and empirically with broad spectrum antibiotics.  2nd cycle of chemotherapy delayed to allow for rehabilitation (was due on 08/31/2015).  Received R-mini-CHOP for cycle 2 and tolerated it without significant cytopenias. Was admitted briefly for a mild lower extremity cellulitis and DVT. Cellulitis now resolved. Patient completing his oral antibiotics.  PET/CT scan on 09/29/2015 with no evidence of disease progression. Good response in his mesenteric lymph nodes.  Inguinal lymph nodes on the right side. Decreased uptake in his C5 level.  No new lesions noted.   Cycle 3 (10/02/2015)  of R- CHOP ( we increased the doxorubicin dose back to 50 mg/m and keep the cyclophosphamide dose reduction at 400 mg/m]  Cycle 4 (10/23/2015) of R-CHOP ( we increased the doxorubicin dose back to 50 mg/m and kept the cyclophosphamide dose reduction at 400 mg/m].  PET/CT 11/10/2015: Shows improvement in most lesions but is noted to have a new FDG avid lymph node in the mesentery of the small bowel.  Cycle 5 on 11/13/2015 R CHOP - received full dose.  Cycle 6 on 12/11/2015 R-CHOP full dose.  Admitted with neutropenic fevers and treatment for possible early pneumonia.  This resolved and he was able to discharge home  01/09/2016 through 01/22/2016: The patient was treated to the C4-C6 levels of the spine to a dose of 30 gray in 10 fractions using a 2 field technique. The patient was also treated to an abdominal lymph node region to a dose of 30 gray in 10 fractions using a 3 field technique. This treatment consisted of a 3-D conformal technique with daily image guidance utilized.   Repeat PET CT scan in March 12 2016 -showed some enlarged mesenteric lymph nodes which were FDG avid.  CT guided FNA of mesenteric lymph nodes at [facility] on 03/29/2016 - showed scant cellularity. No overt evidence of lymphoma.  Rpt PET/CT 05/15/2016 at [facility]- show similar appearing enlarged mesenteric lymph nodes which are FDG avid. No change in size and no other evidence of disease progression.  PET/CT 07/24/2016 - and [facility] shows no evidence of disease progression  PET/CT 01/22/2017:  Result Impression   1.Stable low-level metabolic activity in small lymph nodes in the small bowel mesentery. No new FDG avid disease. 2.Persistent hypermetabolic soft tissue thickening overlying the coccyx which could relate to a decubitus ulcer and should  be amenable to direct inspection. 3.Stable pulmonary nodules which do not appear hypermetabolic, however, several are below the size resolution of PET in size. Continued attention on follow-up is suggested. 4.Previously seen hypermetabolic skin thickening near the right ear has resolved. 5.Ancillary CT findings as above.    INTERVAL HISTORY:   Ethan Stewart is here for f/u and review of recent inguinal LN biopsy. Report shows his LN is positive for Follicular Lymphoma, grade 1-2. The patient was last with [place] on 12/03/18. He presents to the clinic today accompanied by his wife.  He notes he is dong well overall with relief to not need transplant and several months of extensive chemo. He is interested in returning to being active.   On review of symptoms, pt notes no pain from inguinal LN, but notes swelling in right inguinal area post surgery. His appetite is adequate and he is gaining weight. He denies fever, chills, night sweats.     REVIEW OF SYSTEMS:    A 10+ POINT REVIEW OF SYSTEMS WAS OBTAINED including neurology, dermatology, psychiatry, cardiac, respiratory, lymph, extremities, GI, GU, Musculoskeletal, constitutional, breasts, reproductive, HEENT.  All pertinent positives are noted in the HPI.  All others are negative.   ALLERGIES:  is allergic to neulasta [pegfilgrastim].  MEDICATIONS:  Current Outpatient Medications  Medication Sig Dispense Refill  . diazepam (VALIUM) 2 MG tablet Take 2 mg by mouth daily as needed for anxiety.    Ethan Stewart HYDROcodone-acetaminophen (NORCO) 5-325 MG tablet Take 1-2 tablets by mouth every 6 (six) hours as needed for moderate pain or severe pain. 20 tablet 0  . levothyroxine (SYNTHROID, LEVOTHROID) 88 MCG tablet Take 88 mcg by mouth daily before breakfast.   2  . lidocaine-prilocaine (EMLA) cream Apply 1 application topically as needed (for port access).    . Multiple Vitamin (MULTIVITAMIN WITH MINERALS) TABS tablet Take 1 tablet by mouth daily.     Ethan Stewart  senna (SENOKOT) 8.6 MG TABS tablet Take 2 tablets by mouth at bedtime.    . simvastatin (ZOCOR) 20 MG tablet Take 20 mg by mouth daily.    . temazepam (RESTORIL) 15 MG capsule Take 15 mg by mouth at bedtime as needed for sleep.      No current facility-administered medications for this visit.     PHYSICAL EXAMINATION: ECOG PERFORMANCE STATUS: 1 - Symptomatic but completely ambulatory  Vitals:   12/17/18 0925  BP: 137/80  Pulse: 63  Resp: 18  Temp: 97.8 F (36.6 C)  SpO2: 99%   Filed Weights   12/17/18 0925  Weight: 227 lb 14.4 oz (103.4 kg)    GENERAL:alert, in no acute distress and comfortable SKIN: no acute rashes, no significant lesions EYES: conjunctiva are pink and non-injected, sclera anicteric OROPHARYNX: MMM, no exudates, no oropharyngeal erythema or ulceration NECK: supple, no JVD LYMPH:  Couple palpable right inguinal lymph nodes with a dominant 3-4cm right inguinal LN and two to three others that are 1-2cm in size. No palpable lymphadenopathy in the cervical or axillary regions (+) surgical scar of right groin with peri-incisional swelling, possible seroma.  LUNGS: clear to auscultation b/l with normal respiratory effort HEART: regular rate & rhythm ABDOMEN:  normoactive bowel sounds , non tender, not distended. No palpable hepatosplenomegaly.  Extremity: no pedal edema  PSYCH: alert & oriented x 3 with fluent speech NEURO: no focal motor/sensory deficits   LABORATORY DATA:   CBC Latest Ref Rng & Units 12/17/2018 12/03/2018 11/02/2018  WBC 4.0 - 10.5 K/uL 5.0 4.9 4.8  Hemoglobin 13.0 - 17.0 g/dL 14.6 15.3 15.1  Hematocrit 39.0 - 52.0 % 42.9 44.8 46.0  Platelets 150 - 400 K/uL 129(L) 137(L) 152   ANC 300  CMP Latest Ref Rng & Units 12/17/2018 12/03/2018 11/02/2018  Glucose 70 - 99 mg/dL 80 90 91  BUN 8 - 23 mg/dL _0 Creatinine 0.61 - 1.24 mg/dL 1.15 1.08 1.30(H)  Sodium 135 - 145 mmol/L 142 144 141  Potassium 3.5 - 5.1 mmol/L 4.1 4.4 4.1  Chloride  98 - 111 mmol/L 108 107 105  CO2 22 - 32 mmol/L _1 Calcium 8.9 - 10.3 mg/dL 9.4 9.6 9.1  Total Protein 6.5 - 8.1 g/dL 6.7 7.1 6.9  Total Bilirubin 0.3 - 1.2 mg/dL 0.5 0.6 0.5  Alkaline Phos 38 - 126 U/L 87 90 70  AST 15 - 41 U/L 47(H) 25 25  ALT 0 - 44 U/L 54(H) 25 26   . Lab Results  Component Value Date   LDH 207 (H) 12/03/2018    Fine Needle Aspiration, Lymph NodeResulted: 12/04/2018 1:14 PM [facility] Result Narrative  ACCESSION NUMBER: [id number] RECEIVED: 11/27/2018 ORDERING PHYSICIAN: RAKHEE Megan Mans , MD PATIENT NAME: Docia Barrier LYN CYTOLOGY REPORT * Amended * Final Cytologic Interpretation AMENDED REPORT  AMENDED 12/04/2018 TO ADD DIAGNOSIS   A. Right inguinal lymph node, Fine Needle Aspiration II (smears and cell block): B-cell lymphoma with germinal center phenotype (see comment)  Specimen Adequacy: Satisfactory for evaluation.  B. Cytology core biopsy: B-cell lymphoma with germinal center phenotype (see comment)  Specimen Adequacy: Satisfactory for evaluation.    COMMENT:Sections of the core biopsy demonstrate an atypical vaguely nodular lymphoid infiltrate composed of medium sized lymphoid cells with mature chromatin, irregular nuclear membranes and scant cytoplasm. Focally some areas show larger cells. Few mitotic figures are present. Appropriately controlled immunohistochemical stains are performed. Sheets of B-cells are highlighted by CD20. These B-cells co-express BCL2 and BCL6 (focal, nodular distribution). They are negative for CD30 and MYC. CD21 demonstrates few residual disrupted follicular dendritic meshworks. The proliferation index is low (10-20%), as demonstrated by Ki-67. Scattered small T-cells are highlighted by CD3. Concurrent flow cytometry identified a kappa light chain restricted B-cell population, co-expressing CD10.  Overall, the findings are consistent with  involvement by a B-cell lymphoma with a germinal center phenotype (GC-type), with focal follicular architecture and a low proliferation index. Given the nature of the sample (i.e. core biopsy) a final architectural subclassification is not possible. If clinically feasible, an excisional biopsy is recommended.  Correlation with clinical and cytogenetic/molecular data is recommended.    12/11/18 Inguinal LN Biopsy  Interpretation Tissue-Flow Cytometry - MONOCLONAL B-CELL POPULATION EXPRESSING CD10 COMPRISES 92% OF ALL LYMPHOCYTES - SEE COMMENT Microscopic Gated population: Flow cytometric immunophenotyping is performed using antibiodies to the antigens listed in the table below. Electronic gates are placed around a cell cluster displaying light scatter properties corresponding to lymphocytes. - Abnormal Cells in gated population: 92 % - Phenotype of Abnormal Cells: CD10, CD19, CD20, CD21, CD22, HLA-Dr, Kappa Diagnosis Lymph node for lymphoma, Deep Right Inguinal - FOLLICULAR LYMPHOMA, GRADE 1-2 - SEE COMMENT Microscopic Comment The nodal architecture is effaced by a proliferation back to back follicles composed of medium to large lymphocytes with irregular,  cleaved nuclei and scattered centroblasts. There are no diffuse areas of growth present within the examined nodal material. Immunohistochemistry performed on the tissue highlights a nodular B-cell proliferation with expression of CD20, CD10, bcl-6, and Bcl-2. CD21 highlights expanded follicular dendritic meshworks. The Ki-67 shows a slightly increased proliferation rate of 30-40%. The atypical lymphocytes are negative for CD5. CD3 immunohistochemistry highlights the background T-cells. Flow cytometry was performed. A monoclonal B-cell populaton expressing CD10 comprises 92% of all lymphocytes (See [phone]). Overall, the findings are consistent with a diagnosis of low grade follicular lymphoma, Grade 1-2. Of note, the  increased ki-67 may portend a more aggressive course despite the low histologic grade.      RADIOGRAPHIC STUDIES: I have personally reviewed the radiological images as listed and agreed with the findings in the report. No results found.    ASSESSMENT & PLAN:   1) Diffuse large B-cell lymphoma stage IV AE with involvement of mesenteric, inguinal lymph nodes and C5 vertebral body with spinal cord impingement  Patient is status post six cycles of R-CHOP and R-mini-CHOP, completed in 2016, as noted above.  11/02/18 CT A/P which revealed Negative for inguinal or bowel hernia but positive for bulky right inguinal and external iliac lymphadenopathy which is new since a 2017 PET-CT and highly suspicious for recurrent Lymphoma. This would be amenable to Ultrasound-guided needle biopsy. 2. No abdominal or contralateral left hemi pelvic lymphadenopathy. No other acute findings identified in the abdomen or pelvis. 3. Chronic pneumobilia.  Aortic Atherosclerosis.  11/12/18 PET/CT which revealed  Interval development of multistation lymphadenopathy in the abdomen and pelvis as detailed above- [place] 5. 2. Presence of pneumobilia could reflect sphincter of Oddi dysfunction versus recent procedure. Correlate with clinical history and upcoming comprehensive metabolic panel. 3. Ancillary CT findings   11/27/18 US guided LN Biopsy pathology report is as noted above. " B-cell lymphoma with a germinal center phenotype (GC-type), with focal follicular architecture and a low proliferation index. Given the nature of the sample (i.e. core biopsy) a final architectural subclassification is not possible. If clinically feasible, an excisional biopsy is recommended."    2) History of capillary leak syndrome with ARDS requiring intubation likely due to G-CSF.  PLAN: -Discussed pt labwork today, 12/17/18: CBC mostly stable and nml with plt at 129K. Blood chemistries nml except mild elevation in AST/ALT. LDH  still pending.  -Discussed 12/11/18 Inguinal LN Biopsy with Dr. Leo Grosser. Flow [place] showed: Interpretation Tissue-Flow Cytometry - MONOCLONAL B-CELL POPULATION EXPRESSING CD10 COMPRISES 92% OF ALL LYMPHOCYTES. Final diagnosis shows Follicular Lymphoma Grade 1-2.  -Will discuss his case in Tumor Board to review pathology and imaging studies.  -PET scan suggest a stage II disease due to inguinal and abdominal involvement. To complete staging I recommend bone marrow biopsy. He agreed to proceed in 2020.  -Given low grade lymphoma, treatment with R-ICE or transplant is not indicated. At this time  -I discussed treatment for his asymptomatic low grade lymphoma includes observation until symptomatic, local radiation to symptomatic LNs or proceed with chemotherapy, Rituxan.   -After lengthy discussion, we will further determine path of treatment based on BM biopsy and CT AP.  -I recommend he keep his port in case of future treatment. Will do port flush every 6 weeks. He agreed.  -I encourage him to continue to be active after his inguinal area heals from surgery.  -Follow up in 5-6 weeks    3) History of right lower extremity distal DVT. Resolved. S/p 6 months of treatment in 02/2016 -  not currently on anticoagulation.  4) Mild chronic thrombocytopenia- platelets slightly decreased to 129K, continue monitoring.   CT bone marrow aspiration and Biopsy in 5 weeks CT abd/pelvis in 5 weeks RTC with Dr Irene Limbo in 6 weeks with labs and port flush appointment     The total time spent in the appt was 30 minutes and more than 50% was on counseling and direct patient cares.   Sullivan Lone MD [place] [facility]emat[facility]/Oncology Physician [facility]  (Office):       [phone] (Work cell):  [phone] (Fax):           [phone]  I, Joslyn Devon, am acting as scribe for Sullivan Lone, MD.   .I have reviewed the above documentation for accuracy and completeness, and I agree with the  above. Ethan Genera MD

## 2018-12-17 NOTE — Telephone Encounter (Signed)
Printed calendar and avs. °

## 2018-12-18 ENCOUNTER — Ambulatory Visit: Payer: Self-pay

## 2018-12-22 ENCOUNTER — Telehealth: Payer: Self-pay | Admitting: *Deleted

## 2018-12-22 NOTE — Telephone Encounter (Signed)
Ethan Stewart at Dr. Gerre Scull office requested Pathology results from 12/11/18 biopsy. Faxed. Faxed confirmation received

## 2019-01-07 ENCOUNTER — Ambulatory Visit (HOSPITAL_COMMUNITY)
Admission: RE | Admit: 2019-01-07 | Discharge: 2019-01-07 | Disposition: A | Discharge status: Home / Self Care | Payer: No Typology Code available for payment source | Source: Ambulatory Visit | Attending: General Surgery | Admitting: General Surgery

## 2019-01-07 ENCOUNTER — Other Ambulatory Visit (HOSPITAL_COMMUNITY): Payer: Self-pay | Admitting: General Surgery

## 2019-01-07 ENCOUNTER — Other Ambulatory Visit: Payer: Self-pay | Admitting: General Surgery

## 2019-01-07 DIAGNOSIS — C8330 Diffuse large B-cell lymphoma, unspecified site: Secondary | ICD-10-CM | POA: Diagnosis present

## 2019-01-11 ENCOUNTER — Telehealth: Payer: Self-pay | Admitting: *Deleted

## 2019-01-11 NOTE — Telephone Encounter (Signed)
Patient called - has not had port flushed in almost 7 weeks. Next scheduled flush is in February. He will be here on 1/27 for a CT and biopsy, was wondering if he could come then. Contacted patient - Explained that following biopsy, he might not want to wait to leave until after flush. He stated he can come either Wednesday or Friday of this week. Scheduling message sent.

## 2019-01-12 ENCOUNTER — Telehealth: Payer: Self-pay | Admitting: Hematology

## 2019-01-12 NOTE — Telephone Encounter (Signed)
Scheduled appt per 1/20 sch message - pt is aware of appt date and time   

## 2019-01-13 ENCOUNTER — Inpatient Hospital Stay: Payer: No Typology Code available for payment source | Attending: Hematology

## 2019-01-13 DIAGNOSIS — C8338 Diffuse large B-cell lymphoma, lymph nodes of multiple sites: Secondary | ICD-10-CM | POA: Diagnosis not present

## 2019-01-13 DIAGNOSIS — D696 Thrombocytopenia, unspecified: Secondary | ICD-10-CM | POA: Diagnosis not present

## 2019-01-13 DIAGNOSIS — Z86718 Personal history of other venous thrombosis and embolism: Secondary | ICD-10-CM | POA: Diagnosis not present

## 2019-01-13 DIAGNOSIS — Z9221 Personal history of antineoplastic chemotherapy: Secondary | ICD-10-CM | POA: Diagnosis not present

## 2019-01-13 DIAGNOSIS — C8330 Diffuse large B-cell lymphoma, unspecified site: Secondary | ICD-10-CM

## 2019-01-13 DIAGNOSIS — Z95828 Presence of other vascular implants and grafts: Secondary | ICD-10-CM | POA: Insufficient documentation

## 2019-01-13 MED ORDER — SODIUM CHLORIDE 0.9% FLUSH
10.0000 mL | Freq: Once | INTRAVENOUS | Status: AC
  Administered 2019-01-13: 10 mL
  Filled 2019-01-13: qty 10

## 2019-01-13 MED ORDER — HEPARIN SOD (PORK) LOCK FLUSH 100 UNIT/ML IV SOLN
500.0000 [IU] | Freq: Once | INTRAVENOUS | Status: AC
  Administered 2019-01-13: 500 [IU]
  Filled 2019-01-13: qty 5

## 2019-01-15 ENCOUNTER — Other Ambulatory Visit: Payer: Self-pay | Admitting: Physician Assistant

## 2019-01-18 ENCOUNTER — Encounter (HOSPITAL_COMMUNITY): Payer: Self-pay

## 2019-01-18 ENCOUNTER — Ambulatory Visit (HOSPITAL_COMMUNITY)
Admission: RE | Admit: 2019-01-18 | Discharge: 2019-01-18 | Disposition: A | Discharge status: Home / Self Care | Payer: No Typology Code available for payment source | Source: Ambulatory Visit | Attending: Hematology | Admitting: Hematology

## 2019-01-18 ENCOUNTER — Other Ambulatory Visit: Payer: Self-pay

## 2019-01-18 DIAGNOSIS — D72819 Decreased white blood cell count, unspecified: Secondary | ICD-10-CM | POA: Insufficient documentation

## 2019-01-18 DIAGNOSIS — C8330 Diffuse large B-cell lymphoma, unspecified site: Secondary | ICD-10-CM | POA: Diagnosis present

## 2019-01-18 DIAGNOSIS — Z8572 Personal history of non-Hodgkin lymphomas: Secondary | ICD-10-CM | POA: Diagnosis not present

## 2019-01-18 DIAGNOSIS — C8298 Follicular lymphoma, unspecified, lymph nodes of multiple sites: Secondary | ICD-10-CM | POA: Diagnosis not present

## 2019-01-18 LAB — APTT: aPTT: 30 seconds (ref 24–36)

## 2019-01-18 LAB — CBC
HCT: 45.6 % (ref 39.0–52.0)
Hemoglobin: 14.9 g/dL (ref 13.0–17.0)
MCH: 29.7 pg (ref 26.0–34.0)
MCHC: 32.7 g/dL (ref 30.0–36.0)
MCV: 91 fL (ref 80.0–100.0)
PLATELETS: 154 10*3/uL (ref 150–400)
RBC: 5.01 MIL/uL (ref 4.22–5.81)
RDW: 13.4 % (ref 11.5–15.5)
WBC: 3.8 10*3/uL — ABNORMAL LOW (ref 4.0–10.5)
nRBC: 0 % (ref 0.0–0.2)

## 2019-01-18 LAB — PROTIME-INR
INR: 0.99
Prothrombin Time: 13 seconds (ref 11.4–15.2)

## 2019-01-18 MED ORDER — LIDOCAINE-EPINEPHRINE 1 %-1:100000 IJ SOLN
INTRAMUSCULAR | Status: AC | PRN
  Administered 2019-01-18: 10 mL via INTRADERMAL

## 2019-01-18 MED ORDER — MIDAZOLAM HCL 2 MG/2ML IJ SOLN
INTRAMUSCULAR | Status: AC
  Filled 2019-01-18: qty 4

## 2019-01-18 MED ORDER — SODIUM CHLORIDE 0.9 % IV SOLN
INTRAVENOUS | Status: DC
  Administered 2019-01-18: 08:00:00 via INTRAVENOUS

## 2019-01-18 MED ORDER — NALOXONE HCL 0.4 MG/ML IJ SOLN
INTRAMUSCULAR | Status: AC
  Filled 2019-01-18: qty 1

## 2019-01-18 MED ORDER — MIDAZOLAM HCL 2 MG/2ML IJ SOLN
INTRAMUSCULAR | Status: AC | PRN
  Administered 2019-01-18 (×2): 1 mg via INTRAVENOUS

## 2019-01-18 MED ORDER — FENTANYL CITRATE (PF) 100 MCG/2ML IJ SOLN
INTRAMUSCULAR | Status: AC | PRN
  Administered 2019-01-18 (×2): 50 ug via INTRAVENOUS

## 2019-01-18 MED ORDER — FLUMAZENIL 0.5 MG/5ML IV SOLN
INTRAVENOUS | Status: AC
  Filled 2019-01-18: qty 5

## 2019-01-18 MED ORDER — FENTANYL CITRATE (PF) 100 MCG/2ML IJ SOLN
INTRAMUSCULAR | Status: AC
  Filled 2019-01-18: qty 4

## 2019-01-18 NOTE — H&P (Signed)
Referring Physician(s): Brunetta Genera  Supervising Physician: Sandi Mariscal  Patient Status:  [facility] OP  Chief Complaint:  "I'm getting a bone marrow biopsy"  Subjective: Patient familiar to IR service from prior Port-A-Cath placement in 2016 with removal in 2018.  He has a history of previously diagnosed diffuse large B-cell lymphoma and now with newly diagnosed follicular lymphoma who presents today for CT-guided bone marrow biopsy for staging purposes.  He denies fever, headache, chest pain, dyspnea, cough, abdominal/back pain, nausea, vomiting or bleeding.  Past Medical History:  Diagnosis Date  . Anxiety   . ARDS (adult respiratory distress syndrome) ([place])    2016 August at [facility]  . Arm numbness 08/07/15   Limited movement due to tumor on spine  . B-cell lymphoma ([place])   . Bone cancer ([place])     tumor on C5  . 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 Diffuse large B cell lymphoma ([place])    biposy 07/25/2105  . GERD (gastroesophageal reflux disease)   . H. pylori infection   . History of bleeding ulcers 1990's  . History of blood transfusion   . Hypothyroidism    Past Surgical History:  Procedure Laterality Date  . ANTERIOR CERVICAL CORPECTOMY N/A 07/26/2015   Procedure: Cervical five Corpectomy/Cervical four-six Fusion/Plate ;  Surgeon: Eustace Moore, MD;  Location: [place] NEURO ORS;  Service: Neurosurgery;  Laterality: N/A;  C5 Corpectomy/C4-6 Fusion/Plate C4-6  . COLONOSCOPY    . EYE SURGERY Right    cataracts  . HERNIA REPAIR Bilateral    with mesh  . IR GENERIC HISTORICAL  02/03/2017   IR REMOVAL TUN ACCESS W/ PORT W/O FL MOD SED 02/03/2017 Markus Daft, MD [facility]-INTERV RAD  . LYMPH NODE BIOPSY Right 12/11/2018   Procedure: EXCISIONAL BIOPSY OF DEEP RIGHT INGUINAL LYMPH NODE ERAS PATHWAY;  Surgeon: Fanny Skates, MD;  Location: [place];  Service: General;  Laterality: Right;    Allergies: Neulasta [pegfilgrastim]  Medications: Prior to Admission medications   Medication Sig  Start Date End Date Taking? Authorizing Provider  diazepam (VALIUM) 2 MG tablet Take 2 mg by mouth daily as needed for anxiety.   Yes [provider]  levothyroxine (SYNTHROID, LEVOTHROID) 88 MCG tablet Take 88 mcg by mouth daily before breakfast.  08/16/18  Yes [provider]  Multiple Vitamin (MULTIVITAMIN WITH MINERALS) TABS tablet Take 1 tablet by mouth daily.    Yes [provider]  senna (SENOKOT) 8.6 MG TABS tablet Take 2 tablets by mouth at bedtime.   Yes [provider]  simvastatin (ZOCOR) 20 MG tablet Take 20 mg by mouth daily.   Yes [provider]  temazepam (RESTORIL) 15 MG capsule Take 15 mg by mouth at bedtime as needed for sleep.    Yes [provider]  HYDROcodone-acetaminophen (NORCO) 5-325 MG tablet Take 1-2 tablets by mouth every 6 (six) hours as needed for moderate pain or severe pain. Patient not taking: Reported on 12/17/2018 12/11/18   Fanny Skates, MD  lidocaine-prilocaine (EMLA) cream Apply 1 application topically as needed (for port access).    [provider]     Vital Signs:pend   Physical Exam awake, alert.  Chest clear to auscultation bilaterally.  Clean, intact right chest wall Port-A-Cath.  Heart with regular rate and rhythm.  Abdomen soft, positive bowel sounds, nontender.  No lower extremity edema.  Imaging: No results found.  Labs:  CBC: Recent Labs    11/02/18 1854 12/03/18 0856 12/17/18 0814 01/18/19 0750  WBC 4.8 4.9 5.0 3.8*  HGB 15.1 15.3 14.6 14.9  HCT 46.0 44.8 42.9 45.6  PLT 152 137* 129* 154    COAGS: Recent Labs    01/18/19 0750  INR 0.99  APTT 30    BMP: Recent Labs    06/04/18 1133 11/02/18 1854 12/03/18 0856 12/17/18 0842  NA 140 141 144 142  K 4.2 4.1 4.4 4.1  CL 105 105 107 108  CO2 '27 27 27 27  ' GLUCOSE 88 91 90 80  BUN '17 20 18 20  ' CALCIUM 9.4 9.1 9.6 9.4  CREATININE 1.05 1.30* 1.08 1.15  GFRNONAA >60 53* >60 >60  GFRAA >60 >60 >60 >60     LIVER FUNCTION TESTS: Recent Labs    06/04/18 1133 11/02/18 1854 12/03/18 0856 12/17/18 0842  BILITOT 0.5 0.5 0.6 0.5  AST 32 25 25 47*  ALT '27 26 25 ' 54*  ALKPHOS 93 70 90 87  PROT 6.7 6.9 7.1 6.7  ALBUMIN 4.3 4.3 4.2 3.9    Assessment and Plan: Pt with history of previously diagnosed diffuse large B-cell lymphoma and now with newly diagnosed follicular lymphoma who presents today for CT-guided bone marrow biopsy for staging purposes.Risks and benefits discussed with the patient /spouse including, but not limited to bleeding, infection, damage to adjacent structures or low yield requiring additional tests.  All of the patient's questions were answered, patient is agreeable to proceed. Consent signed and in chart.    Electronically Signed: D. Rowe Robert, PA-C 01/18/2019, 8:17 AM   I spent a total of 20 minutes at the the patient's bedside AND on the patient's hospital floor or unit, greater than 50% of which was counseling/coordinating care for CT guided bone marrow biopsy

## 2019-01-18 NOTE — Progress Notes (Signed)
Pt sat up and had blood trickle for puncture site post biopsy, dressing replaced pressure held and continue BR for 15 minutes and repeats same again. Dr Pascal Lux in to see Pt and redress site. OOB and dressed for discharge.

## 2019-01-18 NOTE — Discharge Instructions (Signed)
Moderate Conscious Sedation, Adult, Care After These instructions provide you with information about caring for yourself after your procedure. Your health care provider may also give you more specific instructions. Your treatment has been planned according to current medical practices, but problems sometimes occur. Call your health care provider if you have any problems or questions after your procedure. What can I expect after the procedure? After your procedure, it is common:  To feel sleepy for several hours.  To feel clumsy and have poor balance for several hours.  To have poor judgment for several hours.  To vomit if you eat too soon. Follow these instructions at home: For at least 24 hours after the procedure:   Do not: ? Participate in activities where you could fall or become injured. ? Drive. ? Use heavy machinery. ? Drink alcohol. ? Take sleeping pills or medicines that cause drowsiness. ? Make important decisions or sign legal documents. ? Take care of children on your own.  Rest. Eating and drinking  Follow the diet recommended by your health care provider.  If you vomit: ? Drink water, juice, or soup when you can drink without vomiting. ? Make sure you have little or no nausea before eating solid foods. General instructions  Have a responsible adult stay with you until you are awake and alert.  Take over-the-counter and prescription medicines only as told by your health care provider.  If you smoke, do not smoke without supervision.  Keep all follow-up visits as told by your health care provider. This is important. Contact a health care provider if:  You keep feeling nauseous or you keep vomiting.  You feel light-headed.  You develop a rash.  You have a fever. Get help right away if:  You have trouble breathing. This information is not intended to replace advice given to you by your health care provider. Make sure you discuss any questions you have  with your health care provider. Document Released: 09/29/2013 Document Revised: 05/13/2016 Document Reviewed: 03/30/2016 Elsevier Interactive Patient Education  2019 [place].   Bone Marrow Aspiration and Bone Marrow Biopsy, Adult, Care After This sheet gives you information about how to care for yourself after your procedure. Your health care provider may also give you more specific instructions. If you have problems or questions, contact your health care provider. What can I expect after the procedure? After the procedure, it is common to have:  Mild pain and tenderness.  Swelling.  Bruising. Follow these instructions at home: Puncture site care  Follow instructions from your health care provider about how to take care of the puncture site. Make sure you: ? Wash your hands with soap and water before you change your bandage (dressing). If soap and water are not available, use hand sanitizer. ? Change your dressing as told by your health care provider.  May remove dressing and bathe or shower in 24 hours.  Keep site clean and dry and replace bandaid as necessary.  Check your puncture siteevery day for signs of infection. Check for: ? More redness, swelling, or pain. ? More fluid or blood. ? Warmth. ? Pus or a bad smell. General instructions  Take over-the-counter and prescription medicines only as told by your health care provider.  Do not take baths, swim, or use a hot tub until your health care provider approves. Ask if you can take a shower or have a sponge bath.  Return to your normal activities as told by your health care provider. Ask your  health care provider what activities are safe for you.  Do not drive for 24 hours if you were given a medicine to help you relax (sedative) during your procedure.  Keep all follow-up visits as told by your health care provider. This is important. Contact a health care provider if:  Your pain is not controlled with medicine. Get  help right away if:  You have a fever.  You have more redness, swelling, or pain around the puncture site.  You have more fluid or blood coming from the puncture site.  Your puncture site feels warm to the touch.  You have pus or a bad smell coming from the puncture site. These symptoms may represent a serious problem that is an emergency. Do not wait to see if the symptoms will go away. Get medical help right away. Call your local emergency services (911 in the [place] Do not drive yourself to the hospital. Summary  After the procedure, it is common to have mild pain, tenderness, swelling, and bruising.  Follow instructions from your health care provider about how to take care of the puncture site.  Get help right away if you have any symptoms of infection or if you have more blood or fluid coming from the puncture site. This information is not intended to replace advice given to you by your health care provider. Make sure you discuss any questions you have with your health care provider. Document Released: 06/28/2005 Document Revised: 03/24/2018 Document Reviewed: 05/22/2016 Elsevier Interactive Patient Education  2019 [employer].

## 2019-01-18 NOTE — Procedures (Signed)
Pre-procedure Diagnosis: Diffuse B Cell lymphoma Post-procedure Diagnosis: Same  Technically successful CT guided bone marrow aspiration and biopsy of left iliac crest.   Complications: None Immediate  EBL: None  Signed: Sandi Mariscal Pager: [phone] 01/18/2019, 9:24 AM

## 2019-01-19 ENCOUNTER — Telehealth: Payer: Self-pay | Admitting: *Deleted

## 2019-01-19 NOTE — Telephone Encounter (Signed)
Medical records faxed to [facility]; RI# [phone]

## 2019-01-21 ENCOUNTER — Ambulatory Visit (HOSPITAL_COMMUNITY): Payer: Non-veteran care

## 2019-01-22 ENCOUNTER — Telehealth: Payer: Self-pay | Admitting: *Deleted

## 2019-01-22 NOTE — Telephone Encounter (Signed)
-----   Message from Rolland Bimler, RN sent at 01/22/2019 11:42 AM EST -----  ----- Message ----- From: Brunetta Genera, MD Sent: 01/21/2019   2:50 PM EST To: Rolland Bimler, RN  Plz let patient know BM Bx negative for lymphoma. Will f/u with CT and clinic visit as per plan. thx GK

## 2019-01-22 NOTE — Telephone Encounter (Signed)
Spoke with patient, let him know that BM bx was negative for lymphoma. Will follow up with CT scan and office visit.

## 2019-01-26 ENCOUNTER — Ambulatory Visit (HOSPITAL_COMMUNITY)
Admission: RE | Admit: 2019-01-26 | Discharge: 2019-01-26 | Disposition: A | Discharge status: Home / Self Care | Payer: No Typology Code available for payment source | Source: Ambulatory Visit | Attending: Hematology | Admitting: Hematology

## 2019-01-26 ENCOUNTER — Encounter (HOSPITAL_COMMUNITY): Payer: Self-pay | Admitting: Hematology

## 2019-01-26 DIAGNOSIS — C8298 Follicular lymphoma, unspecified, lymph nodes of multiple sites: Secondary | ICD-10-CM | POA: Diagnosis present

## 2019-01-26 DIAGNOSIS — C8330 Diffuse large B-cell lymphoma, unspecified site: Secondary | ICD-10-CM | POA: Insufficient documentation

## 2019-01-26 MED ORDER — SODIUM CHLORIDE (PF) 0.9 % IJ SOLN
INTRAMUSCULAR | Status: AC
  Filled 2019-01-26: qty 50

## 2019-01-26 MED ORDER — IOHEXOL 300 MG/ML  SOLN
100.0000 mL | Freq: Once | INTRAMUSCULAR | Status: AC | PRN
  Administered 2019-01-26: 100 mL via INTRAVENOUS

## 2019-01-27 NOTE — Progress Notes (Signed)
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 Cytogenetics and Fish showed BCL 2 positivity but negative for BCL 6 and cMYC CD10 positivity suggestive of possibly GCB subtype.  Patient received first cycle of R CHOP on 08/08/2015 and intrathecal methotrexate with hydrocortisone on 08/09/2015. Patient subsequently was admitted with ARDS likely due to neulasta -treated with high dose steroids and empirically with broad spectrum antibiotics.  2nd cycle of chemotherapy delayed to allow for rehabilitation (was due on 08/31/2015).  Received R-mini-CHOP for cycle 2 and tolerated it without significant cytopenias. Was admitted briefly for a mild lower extremity cellulitis and DVT. Cellulitis now resolved. Patient completing his oral antibiotics.  PET/CT scan on 09/29/2015 with no evidence of disease progression. Good response in his mesenteric lymph nodes. Inguinal  lymph nodes on the right side. Decreased uptake in his C5 level.  No new lesions noted.   Cycle 3 (10/02/2015)  of R- CHOP ( we increased the doxorubicin dose back to 50 mg/m and keep the cyclophosphamide dose reduction at 400 mg/m]  Cycle 4 (10/23/2015) of R-CHOP ( we increased the doxorubicin dose back to 50 mg/m and kept the cyclophosphamide dose reduction at 400 mg/m].  PET/CT 11/10/2015: Shows improvement in most lesions but is noted to have a new FDG avid lymph node in the mesentery of the small bowel.  Cycle 5 on 11/13/2015 R CHOP - received full dose.  Cycle 6 on 12/11/2015 R-CHOP full dose.  Admitted with neutropenic fevers and treatment for possible early pneumonia.  This resolved and he was able to discharge home  01/09/2016 through 01/22/2016: The patient was treated to the C4-C6 levels of the spine to a dose of 30 gray in 10 fractions using a 2 field technique. The patient was also treated to an abdominal lymph node region to a dose of 30 gray in 10 fractions using a 3 field technique. This treatment consisted of a 3-D conformal technique with daily image guidance utilized.   Repeat PET CT scan in March 12 2016 -showed some enlarged mesenteric lymph nodes which were FDG avid.  CT guided FNA of mesenteric lymph nodes at [facility] on 03/29/2016 - showed scant cellularity. No overt evidence of lymphoma.  Rpt PET/CT 05/15/2016 at [facility]- show similar appearing enlarged mesenteric lymph nodes which are FDG avid. No change in size and no other evidence of disease progression.  PET/CT 07/24/2016 - and [facility] shows no evidence of disease progression  PET/CT 01/22/2017:  Result Impression   1.Stable low-level metabolic activity in small lymph nodes in the small bowel mesentery. No new FDG avid disease. 2.Persistent hypermetabolic soft tissue thickening overlying the coccyx which could relate to a decubitus ulcer and should be  amenable to direct inspection. 3.Stable pulmonary nodules which do not appear hypermetabolic, however, several are below the size resolution of PET in size. Continued attention on follow-up is suggested. 4.Previously seen hypermetabolic skin thickening near the right ear has resolved. 5.Ancillary CT findings as above.    INTERVAL HISTORY:   [name], grade 1-2. The patient's last visit with [place] was on 12/17/18. He is accompanied today by his wife. The pt reports that he is doing well overall.   The pt reports that he has been following up with Dr. Dalbert Batman regarding an infected seroma in his groin after his December excisional biopsy, which he notes is now resolved. He also notes that he gained about 10 pounds as he has been less active in the last few weeks. The pt denies any fevers, chills, night sweats, or abdominal pains.  Of note since the patient's last visit, pt has had CT A/P completed on 01/26/19 with results revealing Today's study demonstrates clear progression of disease with increased number and size of numerous enlarged lymph nodes throughout the lower thorax, abdomen and pelvis, as detailed above. 2. In addition, there are several small pulmonary nodules in the lung bases. The largest of these are clustered in the posterior aspect of the right lower lobe in an apparent area of scarring which is very similar to prior PET-CT 03/12/2016, favored to be benign. However, the smaller nodules which measure up to 6 mm in size are nonspecific and warrant attention on follow-up studies. 3. Aortic atherosclerosis, in addition to least 2 vessel coronary artery disease. Assessment for potential risk factor modification, dietary therapy or pharmacologic therapy may be warranted, if clinically indicated. 4. Additional incidental findings, as above.  Lab results today (01/28/19) of CBC w/diff and CMP is as follows: all  values are WNL except for PLT at 130k. 01/28/19 LDH at 239  On review of systems, pt reports weight gain, eating well, recent infection, and denies abdominal pains, fatigue, fevers, chills, night sweats, unexpected weight loss, abdominal pains, leg swelling, and any other symptoms.   REVIEW OF SYSTEMS:    A 10+ POINT REVIEW OF SYSTEMS WAS OBTAINED including neurology, dermatology, psychiatry, cardiac, respiratory, lymph, extremities, GI, GU, Musculoskeletal, constitutional, breasts, reproductive, HEENT.  All pertinent positives are noted in the HPI.  All others are negative.   ALLERGIES:  is allergic to neulasta [pegfilgrastim].  MEDICATIONS:  Current Outpatient Medications  Medication Sig Dispense Refill  . diazepam (VALIUM) 2 MG tablet Take 2 mg by mouth daily as needed for anxiety.    Ethan Stewart HYDROcodone-acetaminophen (NORCO) 5-325 MG tablet Take 1-2 tablets by mouth every 6 (six) hours as needed for moderate pain or severe pain. (Patient not taking: Reported on 12/17/2018) 20 tablet 0  . lenalidomide (REVLIMID) 20 MG capsule Take 1 capsule (20 mg total) by mouth daily. 21 days on 7 days off. Swallow whole.  Do not Crush. 21 capsule 11  . levothyroxine (SYNTHROID, LEVOTHROID) 88 MCG tablet Take 88 mcg by mouth daily before breakfast.   2  . lidocaine-prilocaine (EMLA) cream Apply 1 application topically as needed (for port access).    . Multiple Vitamin (MULTIVITAMIN WITH MINERALS) TABS tablet Take 1 tablet by  mouth daily.     Ethan Stewart senna (SENOKOT) 8.6 MG TABS tablet Take 2 tablets by mouth at bedtime.    . simvastatin (ZOCOR) 20 MG tablet Take 20 mg by mouth daily.    . temazepam (RESTORIL) 15 MG capsule Take 15 mg by mouth at bedtime as needed for sleep.      No current facility-administered medications for this visit.     PHYSICAL EXAMINATION: ECOG PERFORMANCE STATUS: 1 - Symptomatic but completely ambulatory  Vitals:   01/28/19 1026  BP: (!) 154/86  Pulse: 66  Resp: 17  Temp: 97.8 F  (36.6 C)  SpO2: 98%   Filed Weights   01/28/19 1026  Weight: 230 lb 11.2 oz (104.6 kg)    GENERAL:alert, in no acute distress and comfortable SKIN: no acute rashes, no significant lesions EYES: conjunctiva are pink and non-injected, sclera anicteric OROPHARYNX: MMM, no exudates, no oropharyngeal erythema or ulceration NECK: supple, no JVD LYMPH: Couple palpable right inguinal LN with a dominant 3-4cm right inguinal LN and two to three others that are 1-2cm in size. No palpable lymphadenopathy in the cervical or axillary regions LUNGS: clear to auscultation b/l with normal respiratory effort HEART: regular rate & rhythm ABDOMEN:  normoactive bowel sounds , non tender, not distended. No palpable hepatosplenomegaly.  Extremity: no pedal edema PSYCH: alert & oriented x 3 with fluent speech NEURO: no focal motor/sensory deficits   LABORATORY DATA:   CBC Latest Ref Rng & Units 01/28/2019 01/18/2019 12/17/2018  WBC 4.0 - 10.5 K/uL 4.0 3.8(L) 5.0  Hemoglobin 13.0 - 17.0 g/dL 13.8 14.9 14.6  Hematocrit 39.0 - 52.0 % 40.9 45.6 42.9  Platelets 150 - 400 K/uL 130(L) 154 129(L)   ANC 300  CMP Latest Ref Rng & Units 01/28/2019 12/17/2018 12/03/2018  Glucose 70 - 99 mg/dL 98 80 90  BUN 8 - 23 mg/dL _0 Creatinine 0.61 - 1.24 mg/dL 1.05 1.15 1.08  Sodium 135 - 145 mmol/L 142 142 144  Potassium 3.5 - 5.1 mmol/L 4.3 4.1 4.4  Chloride 98 - 111 mmol/L 108 108 107  CO2 22 - 32 mmol/L _1 Calcium 8.9 - 10.3 mg/dL 9.2 9.4 9.6  Total Protein 6.5 - 8.1 g/dL 6.7 6.7 7.1  Total Bilirubin 0.3 - 1.2 mg/dL 0.7 0.5 0.6  Alkaline Phos 38 - 126 U/L 91 87 90  AST 15 - 41 U/L 34 47(H) 25  ALT 0 - 44 U/L 31 54(H) 25   . Lab Results  Component Value Date   LDH 239 (H) 01/28/2019    Fine Needle Aspiration, Lymph NodeResulted: 12/04/2018 1:14 PM [facility] Result Narrative  ACCESSION NUMBER: [id number] RECEIVED: 11/27/2018 ORDERING PHYSICIAN: RAKHEE Megan Mans ,  MD PATIENT NAME: Docia Barrier LYN CYTOLOGY REPORT * Amended * Final Cytologic Interpretation AMENDED REPORT  AMENDED 12/04/2018 TO ADD DIAGNOSIS   A. Right inguinal lymph node, Fine Needle Aspiration II (smears and cell block): B-cell lymphoma with germinal center phenotype (see comment)  Specimen Adequacy: Satisfactory for evaluation.  B. Cytology core biopsy: B-cell lymphoma with germinal center phenotype (see comment)  Specimen Adequacy: Satisfactory for evaluation.    COMMENT:Sections of the core biopsy demonstrate an atypical vaguely nodular lymphoid infiltrate composed of medium sized lymphoid cells with mature chromatin, irregular nuclear membranes and scant cytoplasm. Focally some areas show larger cells. Few mitotic figures are present. Appropriately controlled immunohistochemical stains are performed. Sheets of B-cells are highlighted by CD20. These B-cells co-express BCL2 and  BCL6 (focal, nodular distribution). They are negative for CD30 and MYC. CD21 demonstrates few residual disrupted follicular dendritic meshworks. The proliferation index is low (10-20%), as demonstrated by Ki-67. Scattered small T-cells are highlighted by CD3. Concurrent flow cytometry identified a kappa light chain restricted B-cell population, co-expressing CD10.  Overall, the findings are consistent with involvement by a B-cell lymphoma with a germinal center phenotype (GC-type), with focal follicular architecture and a low proliferation index. Given the nature of the sample (i.e. core biopsy) a final architectural subclassification is not possible. If clinically feasible, an excisional biopsy is recommended.  Correlation with clinical and cytogenetic/molecular data is recommended.    12/11/18 Inguinal LN Biopsy  Interpretation Tissue-Flow Cytometry - MONOCLONAL B-CELL POPULATION EXPRESSING CD10 COMPRISES 92% OF ALL LYMPHOCYTES - SEE  COMMENT Microscopic Gated population: Flow cytometric immunophenotyping is performed using antibiodies to the antigens listed in the table below. Electronic gates are placed around a cell cluster displaying light scatter properties corresponding to lymphocytes. - Abnormal Cells in gated population: 92 % - Phenotype of Abnormal Cells: CD10, CD19, CD20, CD21, CD22, HLA-Dr, Kappa Diagnosis Lymph node for lymphoma, Deep Right Inguinal - FOLLICULAR LYMPHOMA, GRADE 1-2 - SEE COMMENT Microscopic Comment The nodal architecture is effaced by a proliferation back to back follicles composed of medium to large lymphocytes with irregular, cleaved nuclei and scattered centroblasts. There are no diffuse areas of growth present within the examined nodal material. Immunohistochemistry performed on the tissue highlights a nodular B-cell proliferation with expression of CD20, CD10, bcl-6, and Bcl-2. CD21 highlights expanded follicular dendritic meshworks. The Ki-67 shows a slightly increased proliferation rate of 30-40%. The atypical lymphocytes are negative for CD5. CD3 immunohistochemistry highlights the background T-cells. Flow cytometry was performed. A monoclonal B-cell populaton expressing CD10 comprises 92% of all lymphocytes (See [phone]). Overall, the findings are consistent with a diagnosis of low grade follicular lymphoma, Grade 1-2. Of note, the increased ki-67 may portend a more aggressive course despite the low histologic grade.   01/18/19 BM Biopsy:     RADIOGRAPHIC STUDIES: I have personally reviewed the radiological images as listed and agreed with the findings in the report. No results found.    ASSESSMENT & PLAN:   1) Diffuse large B-cell lymphoma stage IV AE with involvement of mesenteric, inguinal lymph nodes and C5 vertebral body with spinal cord impingement  Patient is status post six cycles of R-CHOP and R-mini-CHOP, completed in 2016, as noted above.  2) History of  capillary leak syndrome with ARDS requiring intubation likely due to G-CSF.  3) Newly diagnosed Follicular Lymphoma, Grade 1-2   11/02/18 CT A/P which revealed Negative for inguinal or bowel hernia but positive for bulky right inguinal and external iliac lymphadenopathy which is new since a 2017 PET-CT and highly suspicious for recurrent Lymphoma. This would be amenable to Ultrasound-guided needle biopsy. 2. No abdominal or contralateral left hemi pelvic lymphadenopathy. No other acute findings identified in the abdomen or pelvis. 3. Chronic pneumobilia.  Aortic Atherosclerosis.  11/12/18 PET/CT which revealed  Interval development of multistation lymphadenopathy in the abdomen and pelvis as detailed above- [place] 5. 2. Presence of pneumobilia could reflect sphincter of Oddi dysfunction versus recent procedure. Correlate with clinical history and upcoming comprehensive metabolic panel. 3. Ancillary CT findings   11/27/18 US guided LN Biopsy pathology report is as noted above. " B-cell lymphoma with a germinal center phenotype (GC-type), with focal follicular architecture and a low proliferation index. Given the nature of the sample (i.e. core biopsy) a final  architectural subclassification is not possible. If clinically feasible, an excisional biopsy is recommended."  12/11/18 Right Inguinal LN biopsy revealed Grade 1-2 Follicular Lymphoma  PLAN: -Discussed pt labwork today, 01/28/19; PLT borderline low at 130k, LDH at 239, chemistries and other blood counts are normal -Discussed the 01/26/19 CT A/P which revealed Today's study demonstrates clear progression of disease with increased number and size of numerous enlarged lymph nodes throughout the lower thorax, abdomen and pelvis, as detailed above. 2. In addition, there are several small pulmonary nodules in the lung bases. The largest of these are clustered in the posterior aspect of the right lower lobe in an apparent area of scarring which is  very similar to prior PET-CT 03/12/2016, favored to be benign. However, the smaller nodules which measure up to 6 mm in size are nonspecific and warrant attention on follow-up studies. 3. Aortic atherosclerosis, in addition to least 2 vessel coronary artery disease. Assessment for potential risk factor modification, dietary therapy or pharmacologic therapy may be warranted, if clinically indicated. 4. Additional incidental findings, as above. -Discussed the 01/18/19 BM Bx which revealed normocellular bone marrow with trilineage hematopoiesis -Stage II indicated with 01/26/19 CT -Did discuss his case in Tumor Board to review pathology and imaging studies -Discussed the treatment options of Bendamustine and Rituxan up to 4-6 cycles, vs Revlimid and Rituxan, with preference for the latter as there is not need for emergent response but rather deeper response -Pt prefers the latter option of beginning 38m Revlimid and Rituxan. Discussed associated risks and provided supplemental information.  -Pt has a history of DVT, and will start pt on preventative dose of Xarelto while taking Revlimid due to the associated risk of blood clots -Port flush every 6 weeks -Recommend OTC Clotrimazole for inguinal yeast infection after antibiotic use -Recommended that the pt continue to eat well, drink at least 48-64 oz of water each day, and walk 20-30 minutes each day. -Will see the pt back in 2 weeks   3) History of right lower extremity distal DVT. Resolved. S/p 6 months of treatment in 02/2016 -not currently on anticoagulation.  4) Mild chronic thrombocytopenia- platelets slightly decreased to 129K, continue monitoring.   Please plan to start Rituxan in 7-10 days (q4weeks)x 3 with labs RTC with Dr KIrene Limbowith labs 1 week after 1st dose of Rituxan   The total time spent in the appt was 40 minutes and more than 50% was on counseling and direct patient cares.   GSullivan LoneMD MS Hematology/Oncology Physician [facility] (Office):       [phone](Work cell):  [phone](Fax):           [phone] I, SBaldwin Jamaica am acting as a scribe for Dr. GSullivan Lone   .I have reviewed the above documentation for accuracy and completeness, and I agree with the above. .Brunetta GeneraMD

## 2019-01-28 ENCOUNTER — Telehealth: Payer: Self-pay

## 2019-01-28 ENCOUNTER — Telehealth: Payer: Self-pay | Admitting: *Deleted

## 2019-01-28 ENCOUNTER — Inpatient Hospital Stay: Payer: No Typology Code available for payment source | Attending: Hematology

## 2019-01-28 ENCOUNTER — Inpatient Hospital Stay (HOSPITAL_BASED_OUTPATIENT_CLINIC_OR_DEPARTMENT_OTHER): Payer: No Typology Code available for payment source | Admitting: Hematology

## 2019-01-28 ENCOUNTER — Inpatient Hospital Stay: Payer: No Typology Code available for payment source

## 2019-01-28 VITALS — BP 154/86 | HR 66 | Temp 97.8°F | Resp 17 | Ht 73.0 in | Wt 230.7 lb

## 2019-01-28 DIAGNOSIS — Z85828 Personal history of other malignant neoplasm of skin: Secondary | ICD-10-CM

## 2019-01-28 DIAGNOSIS — Z86718 Personal history of other venous thrombosis and embolism: Secondary | ICD-10-CM | POA: Insufficient documentation

## 2019-01-28 DIAGNOSIS — Z95828 Presence of other vascular implants and grafts: Secondary | ICD-10-CM

## 2019-01-28 DIAGNOSIS — C8330 Diffuse large B-cell lymphoma, unspecified site: Secondary | ICD-10-CM

## 2019-01-28 DIAGNOSIS — D696 Thrombocytopenia, unspecified: Secondary | ICD-10-CM | POA: Insufficient documentation

## 2019-01-28 DIAGNOSIS — Z5111 Encounter for antineoplastic chemotherapy: Secondary | ICD-10-CM | POA: Insufficient documentation

## 2019-01-28 DIAGNOSIS — Z7901 Long term (current) use of anticoagulants: Secondary | ICD-10-CM | POA: Diagnosis not present

## 2019-01-28 DIAGNOSIS — C8298 Follicular lymphoma, unspecified, lymph nodes of multiple sites: Secondary | ICD-10-CM

## 2019-01-28 DIAGNOSIS — Z452 Encounter for adjustment and management of vascular access device: Secondary | ICD-10-CM | POA: Insufficient documentation

## 2019-01-28 LAB — CMP (CANCER CENTER ONLY)
ALT: 31 U/L (ref 0–44)
ANION GAP: 7 (ref 5–15)
AST: 34 U/L (ref 15–41)
Albumin: 4.1 g/dL (ref 3.5–5.0)
Alkaline Phosphatase: 91 U/L (ref 38–126)
BUN: 17 mg/dL (ref 8–23)
CO2: 27 mmol/L (ref 22–32)
Calcium: 9.2 mg/dL (ref 8.9–10.3)
Chloride: 108 mmol/L (ref 98–111)
Creatinine: 1.05 mg/dL (ref 0.61–1.24)
GFR, Est AFR Am: >60 mL/min (ref >60)
GFR, Estimated: >60 mL/min (ref >60)
Glucose, Bld: 98 mg/dL (ref 70–99)
Potassium: 4.3 mmol/L (ref 3.5–5.1)
Sodium: 142 mmol/L (ref 135–145)
Total Bilirubin: 0.7 mg/dL (ref 0.3–1.2)
Total Protein: 6.7 g/dL (ref 6.5–8.1)

## 2019-01-28 LAB — CBC WITH DIFFERENTIAL/PLATELET
Abs Immature Granulocytes: 0.01 10*3/uL (ref 0.00–0.07)
BASOS PCT: 1 %
Basophils Absolute: 0 10*3/uL (ref 0.0–0.1)
Eosinophils Absolute: 0.1 10*3/uL (ref 0.0–0.5)
Eosinophils Relative: 3 %
HCT: 40.9 % (ref 39.0–52.0)
Hemoglobin: 13.8 g/dL (ref 13.0–17.0)
Immature Granulocytes: 0 %
Lymphocytes Relative: 26 %
Lymphs Abs: 1 10*3/uL (ref 0.7–4.0)
MCH: 30.1 pg (ref 26.0–34.0)
MCHC: 33.7 g/dL (ref 30.0–36.0)
MCV: 89.3 fL (ref 80.0–100.0)
Monocytes Absolute: 0.6 10*3/uL (ref 0.1–1.0)
Monocytes Relative: 15 %
Neutro Abs: 2.2 10*3/uL (ref 1.7–7.7)
Neutrophils Relative %: 55 %
PLATELETS: 130 10*3/uL — AB (ref 150–400)
RBC: 4.58 MIL/uL (ref 4.22–5.81)
RDW: 13.4 % (ref 11.5–15.5)
WBC: 4 10*3/uL (ref 4.0–10.5)
nRBC: 0 % (ref 0.0–0.2)

## 2019-01-28 LAB — LACTATE DEHYDROGENASE: LDH: 239 U/L — ABNORMAL HIGH (ref 98–192)

## 2019-01-28 MED ORDER — LENALIDOMIDE 20 MG PO CAPS: 20.0000 mg | ORAL_CAPSULE | Freq: Every day | ORAL | 11 refills | Status: DC

## 2019-01-28 MED ORDER — HEPARIN SOD (PORK) LOCK FLUSH 100 UNIT/ML IV SOLN
500.0000 [IU] | Freq: Once | INTRAVENOUS | Status: AC
  Administered 2019-01-28: 500 [IU]
  Filled 2019-01-28: qty 5

## 2019-01-28 MED ORDER — SODIUM CHLORIDE 0.9% FLUSH
10.0000 mL | Freq: Once | INTRAVENOUS | Status: AC
  Administered 2019-01-28: 10 mL
  Filled 2019-01-28: qty 10

## 2019-01-28 NOTE — Telephone Encounter (Signed)
Printed avs and calender of upcoming appointment. Per 2/6 los. First dos of Rituxan added to the book for approval for 2/14

## 2019-01-28 NOTE — Telephone Encounter (Signed)
Patient/physician agreement for RevlimidREMS completed and faxed to [place]. Fax confirmation received

## 2019-01-28 NOTE — Telephone Encounter (Signed)
Medical records faxed to [place]; RI# [phone]

## 2019-01-29 ENCOUNTER — Other Ambulatory Visit: Payer: Self-pay | Admitting: Hematology

## 2019-01-29 ENCOUNTER — Other Ambulatory Visit: Payer: Self-pay | Admitting: *Deleted

## 2019-01-29 ENCOUNTER — Telehealth: Payer: Self-pay | Admitting: Pharmacist

## 2019-01-29 MED ORDER — RIVAROXABAN 10 MG PO TABS: 10.0000 mg | ORAL_TABLET | Freq: Every day | ORAL | 5 refills | Status: DC

## 2019-01-29 MED ORDER — LENALIDOMIDE 20 MG PO CAPS: 20.0000 mg | ORAL_CAPSULE | Freq: Every day | ORAL | 11 refills | Status: DC

## 2019-01-29 NOTE — Telephone Encounter (Signed)
Oral Oncology Pharmacist Encounter  I verified with pharmacy customer care for the [facility] pharmacy that they are able to dispense prescription written by Dr. Irene Limbo has patient has been referred to receive community care at the [facility] under the care of Dr. Irene Limbo.  Revlimid prescription faxed to the [place] pharmacy at [phone] along with office cover sheet and demographic information for the patient. I included on the cover sheet the patient referral to community care as referral #: [facility] [id number]  Referral number should be included with any correspondence to [place] to ensure that any requests are processed as quickly as possible.  I will plan to stop by chemotherapy education course scheduled for 02/01/2019 at 10 AM to provide an update on Revlimid prescription processing.  Johny Drilling, PharmD, BCPS, BCOP  01/29/2019 3:34 PM Oral Oncology Clinic [phone]

## 2019-01-29 NOTE — Telephone Encounter (Signed)
Oral Oncology Pharmacist Encounter  Received new prescription for Revlimid (lenalidomide) for the treatment of grade 1-2 follicular lymphoma in conjunction with rituximab, planned duration until disease progression or unacceptable toxicity.  Labs from 01/28/2019 assessed, OK for treatment. SCr=1.05, est CrCl ~ 85 mL/min  Revlimid is planned to be dosed at 20mg  by mouth once daily for 21 days on, 7 days off, repeated every 28 days  Current medication list in Epic reviewed, no  DDIs with Revlimid identified.  MD states patient will be prescribed Xarelto for thromboprophylaxis due to treatment with Revlimid, this is not noted on patient's current medication list. I will follow-up with MD about agent for thromboprophylaxis.  Patient has been referred to community care for oncology treatment through the [facility] services. I can confirmed with the [place] pharmacy that they are able to dispense Revlimid prescriptions. We will work through the appropriate channels to have the Revlimid prescription sent to the [place] so that it can be dispensed to the patient at little to no out-of-pocket cost to the patient.  Oral Oncology Clinic will continue to follow for insurance authorization, copayment issues, initial counseling and start date.  Ethan Stewart, PharmD, BCPS, BCOP  01/29/2019 9:21 AM Oral Oncology Clinic [phone]

## 2019-02-01 ENCOUNTER — Inpatient Hospital Stay: Payer: No Typology Code available for payment source

## 2019-02-01 ENCOUNTER — Other Ambulatory Visit: Payer: Self-pay | Admitting: *Deleted

## 2019-02-01 NOTE — Telephone Encounter (Signed)
Oral Chemotherapy Pharmacist Encounter   I spoke with patient and wife, [name], in chemotherapy education class for overview of: Revlimid (lenalidomide) for the treatment of grade 1-2 follicular lymphoma in conjunction with rituximab, planned duration until disease progression or unacceptable toxicity.   Counseled patient on administration, dosing, side effects, monitoring, drug-food interactions, safe handling, storage, and disposal.  Patient will take Revlimid 20mg  capsules, 1 capsule by mouth once daily, without regard to food, with a full glass of water.  Revlimid will be given 21 days on, 7 days off, repeat every 28 days.  Revlimid start date: planned for 02/05/2019  Adverse effects of Revlimid include but are not limited to: nausea, constipation, diarrhea, abdominal pain, rash, fatigue, drug fever, and decreased blood counts.    Reviewed with patient importance of keeping a medication schedule and plan for any missed doses.  Mr. Kirby voiced understanding and appreciation.   All questions answered. Medication reconciliation performed and medication/allergy list updated.  Revlimid prescription faxed to the [facility] pharmacy on 01/29/2019 at [phone]. I received a fax back from the [place] pharmacy with the patient prescription form from the [facility] of veterans affairs for Revlimid. Form completed and faxed back, with original prescription, to [place] at [phone].  Patient has been prescribed Xarelto 10 mg tablets, take 1 tablet by mouth once daily for VTE prophylaxis as patient has had previous blood clot. Patient also endorses previous bleeding ulcer with aspirin use back in 2000. He states he went to go pick up Xarelto from local pharmacy this weekend and was quoted co-pay approximately $300 for 30-day supply. Written Xarelto prescription has also now been faxed to [place] and was included in the fax package with Revlimid prescription and acquisition form.  Patient knows to  call the office with questions or concerns. Oral Oncology Clinic will continue to follow.  Johny Drilling, PharmD, BCPS, BCOP  02/01/2019    1:15 PM Oral Oncology Clinic [phone]

## 2019-02-02 ENCOUNTER — Telehealth: Payer: Self-pay | Admitting: Hematology

## 2019-02-02 NOTE — Telephone Encounter (Signed)
Faxed medical records to [facility], Release [id number]

## 2019-02-03 ENCOUNTER — Other Ambulatory Visit: Payer: Self-pay | Admitting: Hematology

## 2019-02-03 NOTE — Telephone Encounter (Signed)
Oral Oncology Patient Advocate Encounter  [facility] called [facility] to follow [facility] Revlimid and Xarelto. Both medicines were shipped to the patient on 02/01/19 and could take 7 days to arrive to the patients home.  [place] Patient [place] Phone [phone] Fax [phone]

## 2019-02-03 NOTE — Telephone Encounter (Signed)
Oral Oncology Pharmacist Encounter  I called and informed patient about update of medication shipment from the [place]. Patient states he has received the Revlimid capsules today.  He will start his Revlimid on Friday 02/05/19 with his 1st dose of rituximab.  He has not received Xarelto prescription yet, but knows to start this supportive care medication as soon as he receives it.  Patient will contact Dr. Grier Mitts collaborative practice RN when it is time to get new prescription to [place] for next cycle.  Johny Drilling, PharmD, BCPS, BCOP  02/03/2019 12:55 PM Oral Oncology Clinic [phone]

## 2019-02-04 ENCOUNTER — Other Ambulatory Visit: Payer: Self-pay

## 2019-02-05 ENCOUNTER — Other Ambulatory Visit: Payer: Self-pay | Admitting: *Deleted

## 2019-02-05 ENCOUNTER — Inpatient Hospital Stay: Payer: No Typology Code available for payment source

## 2019-02-05 VITALS — BP 128/61 | HR 64 | Temp 97.9°F | Resp 16

## 2019-02-05 DIAGNOSIS — C8298 Follicular lymphoma, unspecified, lymph nodes of multiple sites: Secondary | ICD-10-CM

## 2019-02-05 DIAGNOSIS — C8330 Diffuse large B-cell lymphoma, unspecified site: Secondary | ICD-10-CM

## 2019-02-05 DIAGNOSIS — Z5111 Encounter for antineoplastic chemotherapy: Secondary | ICD-10-CM | POA: Diagnosis not present

## 2019-02-05 LAB — CMP (CANCER CENTER ONLY)
ALT: 28 U/L (ref 0–44)
AST: 27 U/L (ref 15–41)
Albumin: 4 g/dL (ref 3.5–5.0)
Alkaline Phosphatase: 89 U/L (ref 38–126)
Anion gap: 10 (ref 5–15)
BUN: 20 mg/dL (ref 8–23)
CO2: 26 mmol/L (ref 22–32)
Calcium: 8.5 mg/dL — ABNORMAL LOW (ref 8.9–10.3)
Chloride: 106 mmol/L (ref 98–111)
Creatinine: 1.13 mg/dL (ref 0.61–1.24)
GFR, Est AFR Am: >60 mL/min (ref >60)
GFR, Estimated: >60 mL/min (ref >60)
GLUCOSE: 90 mg/dL (ref 70–99)
Potassium: 4.5 mmol/L (ref 3.5–5.1)
Sodium: 142 mmol/L (ref 135–145)
Total Bilirubin: 0.5 mg/dL (ref 0.3–1.2)
Total Protein: 6.6 g/dL (ref 6.5–8.1)

## 2019-02-05 LAB — CBC WITH DIFFERENTIAL (CANCER CENTER ONLY)
Abs Immature Granulocytes: 0.01 10*3/uL (ref 0.00–0.07)
Basophils Absolute: 0 10*3/uL (ref 0.0–0.1)
Basophils Relative: 1 %
Eosinophils Absolute: 0.1 10*3/uL (ref 0.0–0.5)
Eosinophils Relative: 2 %
HCT: 43.6 % (ref 39.0–52.0)
Hemoglobin: 14.4 g/dL (ref 13.0–17.0)
Immature Granulocytes: 0 %
Lymphocytes Relative: 21 %
Lymphs Abs: 0.9 10*3/uL (ref 0.7–4.0)
MCH: 29.8 pg (ref 26.0–34.0)
MCHC: 33 g/dL (ref 30.0–36.0)
MCV: 90.3 fL (ref 80.0–100.0)
MONO ABS: 0.5 10*3/uL (ref 0.1–1.0)
Monocytes Relative: 12 %
Neutro Abs: 2.7 10*3/uL (ref 1.7–7.7)
Neutrophils Relative %: 64 %
Platelet Count: 142 10*3/uL — ABNORMAL LOW (ref 150–400)
RBC: 4.83 MIL/uL (ref 4.22–5.81)
RDW: 13.3 % (ref 11.5–15.5)
WBC Count: 4.3 10*3/uL (ref 4.0–10.5)
nRBC: 0 % (ref 0.0–0.2)

## 2019-02-05 MED ORDER — DIPHENHYDRAMINE HCL 25 MG PO CAPS
ORAL_CAPSULE | ORAL | Status: AC
  Filled 2019-02-05: qty 2

## 2019-02-05 MED ORDER — METHYLPREDNISOLONE SODIUM SUCC 125 MG IJ SOLR
INTRAMUSCULAR | Status: AC
  Filled 2019-02-05: qty 2

## 2019-02-05 MED ORDER — METHYLPREDNISOLONE SODIUM SUCC 125 MG IJ SOLR
125.0000 mg | Freq: Every day | INTRAMUSCULAR | Status: DC
  Administered 2019-02-05: 125 mg via INTRAVENOUS

## 2019-02-05 MED ORDER — SODIUM CHLORIDE 0.9 % IV SOLN
375.0000 mg/m2 | Freq: Once | INTRAVENOUS | Status: AC
  Administered 2019-02-05: 900 mg via INTRAVENOUS
  Filled 2019-02-05: qty 50

## 2019-02-05 MED ORDER — SODIUM CHLORIDE 0.9 % IV SOLN
Freq: Once | INTRAVENOUS | Status: AC
  Administered 2019-02-05: 09:00:00 via INTRAVENOUS
  Filled 2019-02-05: qty 250

## 2019-02-05 MED ORDER — SODIUM CHLORIDE 0.9% FLUSH
10.0000 mL | INTRAVENOUS | Status: DC | PRN
  Administered 2019-02-05: 10 mL
  Filled 2019-02-05: qty 10

## 2019-02-05 MED ORDER — DIPHENHYDRAMINE HCL 25 MG PO CAPS
50.0000 mg | ORAL_CAPSULE | Freq: Once | ORAL | Status: AC
  Administered 2019-02-05: 50 mg via ORAL

## 2019-02-05 MED ORDER — ACETAMINOPHEN 325 MG PO TABS
ORAL_TABLET | ORAL | Status: AC
  Filled 2019-02-05: qty 2

## 2019-02-05 MED ORDER — ACETAMINOPHEN 325 MG PO TABS
650.0000 mg | ORAL_TABLET | Freq: Once | ORAL | Status: AC
  Administered 2019-02-05: 650 mg via ORAL

## 2019-02-05 MED ORDER — HEPARIN SOD (PORK) LOCK FLUSH 100 UNIT/ML IV SOLN
500.0000 [IU] | Freq: Once | INTRAVENOUS | Status: AC | PRN
  Administered 2019-02-05: 500 [IU]
  Filled 2019-02-05: qty 5

## 2019-02-05 NOTE — Progress Notes (Signed)
Cbc, CMP and Hep B not ordered prior to first time Rituxan. Orders placed.

## 2019-02-05 NOTE — Patient Instructions (Signed)
[facility] Discharge Instructions for Patients Receiving Chemotherapy  Today you received the following chemotherapy agents:  Rituxan   To help prevent nausea and vomiting after your treatment, we encourage you to take your nausea medication as prescribed.   If you develop nausea and vomiting that is not controlled by your nausea medication, call the clinic.   BELOW ARE SYMPTOMS THAT SHOULD BE REPORTED IMMEDIATELY:  *FEVER GREATER THAN 100.5 F  *CHILLS WITH OR WITHOUT FEVER  NAUSEA AND VOMITING THAT IS NOT CONTROLLED WITH YOUR NAUSEA MEDICATION  *UNUSUAL SHORTNESS OF BREATH  *UNUSUAL BRUISING OR BLEEDING  TENDERNESS IN MOUTH AND THROAT WITH OR WITHOUT PRESENCE OF ULCERS  *URINARY PROBLEMS  *BOWEL PROBLEMS  UNUSUAL RASH Items with * indicate a potential emergency and should be followed up as soon as possible.  Feel free to call the clinic should you have any questions or concerns. The clinic phone number is [phone].  Please show the CHEMO ALERT CARD at check-in to the Emergency Department and triage nurse.   

## 2019-02-06 LAB — HEPATITIS B SURFACE ANTIBODY,QUALITATIVE: Hep B S Ab: NONREACTIVE

## 2019-02-08 ENCOUNTER — Other Ambulatory Visit: Payer: Self-pay | Admitting: Hematology

## 2019-02-09 ENCOUNTER — Telehealth: Payer: Self-pay | Admitting: *Deleted

## 2019-02-09 NOTE — Telephone Encounter (Signed)
Called to f/u after first time chemo on 2/14. Patient reports doing well. Encouraged to contact office for questions or concerns.

## 2019-02-10 ENCOUNTER — Other Ambulatory Visit: Payer: Self-pay | Admitting: *Deleted

## 2019-02-10 DIAGNOSIS — C8298 Follicular lymphoma, unspecified, lymph nodes of multiple sites: Secondary | ICD-10-CM

## 2019-02-10 NOTE — Progress Notes (Signed)
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 Cytogenetics and Fish showed BCL 2 positivity but negative for BCL 6 and cMYC CD10 positivity suggestive of possibly GCB subtype.  Patient received first cycle of R CHOP on 08/08/2015 and intrathecal methotrexate with hydrocortisone on 08/09/2015. Patient subsequently was admitted with ARDS likely due to neulasta -treated with high dose steroids and empirically with broad spectrum antibiotics.  2nd cycle of chemotherapy delayed to allow for rehabilitation (was due on 08/31/2015).  Received R-mini-CHOP for cycle 2 and tolerated it without significant cytopenias. Was admitted briefly for a mild lower extremity cellulitis and DVT. Cellulitis now resolved. Patient completing his oral antibiotics.  PET/CT scan on 09/29/2015 with no evidence of disease progression. Good response in his mesenteric lymph nodes. Inguinal  lymph nodes on the right side. Decreased uptake in his C5 level.  No new lesions noted.   Cycle 3 (10/02/2015)  of R- CHOP ( we increased the doxorubicin dose back to 50 mg/m and keep the cyclophosphamide dose reduction at 400 mg/m]  Cycle 4 (10/23/2015) of R-CHOP ( we increased the doxorubicin dose back to 50 mg/m and kept the cyclophosphamide dose reduction at 400 mg/m].  PET/CT 11/10/2015: Shows improvement in most lesions but is noted to have a new FDG avid lymph node in the mesentery of the small bowel.  Cycle 5 on 11/13/2015 R CHOP - received full dose.  Cycle 6 on 12/11/2015 R-CHOP full dose.  Admitted with neutropenic fevers and treatment for possible early pneumonia.  This resolved and he was able to discharge home  01/09/2016 through 01/22/2016: The patient was treated to the C4-C6 levels of the spine to a dose of 30 gray in 10 fractions using a 2 field technique. The patient was also treated to an abdominal lymph node region to a dose of 30 gray in 10 fractions using a 3 field technique. This treatment consisted of a 3-D conformal technique with daily image guidance utilized.   Repeat PET CT scan in March 12 2016 -showed some enlarged mesenteric lymph nodes which were FDG avid.  CT guided FNA of mesenteric lymph nodes at [facility] on 03/29/2016 - showed scant cellularity. No overt evidence of lymphoma.  Rpt PET/CT 05/15/2016 at [facility]- show similar appearing enlarged mesenteric lymph nodes which are FDG avid. No change in size and no other evidence of disease progression.  PET/CT 07/24/2016 - and [facility] shows no evidence of disease progression  PET/CT 01/22/2017:  Result Impression   1.Stable low-level metabolic activity in small lymph nodes in the small bowel mesentery. No new FDG avid disease. 2.Persistent hypermetabolic soft tissue thickening overlying the coccyx which could relate to a decubitus ulcer and should be  amenable to direct inspection. 3.Stable pulmonary nodules which do not appear hypermetabolic, however, several are below the size resolution of PET in size. Continued attention on follow-up is suggested. 4.Previously seen hypermetabolic skin thickening near the right ear has resolved. 5.Ancillary CT findings as above.    INTERVAL HISTORY:   Ethan Stewart is here for management and evaluation of his newly diagnsoed Follicular Lymphoma, grade 1-2. The patient's last visit with [place] was on 01/28/19. He is accompanied today by his wife. The pt reports that he is doing well overall.   The pt began taking Revlimid on 02/05/19 and notes that he "had a little bit of nausea, but not too bad." He denies vomiting, and notes that his nausea resolved on its own yesterday. He also has had some mild itching on his scalp, and denies this being very bothersome as well. He denies any skin rashes. He notes that his inguinal lymph node has neither grown nor reduced in size, and denies noticing any other new lumps or bumps.   He has continued going to the gym regularly, and takes naps when he feels tired.   Lab results today (02/11/19) of CBC w/diff and CMP is as follows: all values are WNL except for PLT at 139k.  On review of systems, pt reports staying active, stable energy levels, eating well, resolved mild nausea, mild itching on scalp, and denies skin rashes, fatigue, vomiting, and any other symptoms.   REVIEW OF SYSTEMS:    A 10+ POINT REVIEW OF SYSTEMS WAS OBTAINED including neurology, dermatology, psychiatry, cardiac, respiratory, lymph, extremities, GI, GU, Musculoskeletal, constitutional, breasts, reproductive, HEENT.  All pertinent positives are noted in the HPI.  All others are negative.   ALLERGIES:  is allergic to neulasta [pegfilgrastim].  MEDICATIONS:  Current Outpatient Medications  Medication Sig Dispense Refill  . diazepam (VALIUM) 2 MG tablet Take 2 mg by mouth daily as needed for anxiety.      Ethan Stewart HYDROcodone-acetaminophen (NORCO) 5-325 MG tablet Take 1-2 tablets by mouth every 6 (six) hours as needed for moderate pain or severe pain. (Patient not taking: Reported on 12/17/2018) 20 tablet 0  . lenalidomide (REVLIMID) 20 MG capsule Take 1 capsule (20 mg total) by mouth daily. 21 days on 7 days off. Swallow whole.  Do not Crush. 21 capsule 11  . levothyroxine (SYNTHROID, LEVOTHROID) 88 MCG tablet Take 88 mcg by mouth daily before breakfast.   2  . lidocaine-prilocaine (EMLA) cream Apply 1 application topically as needed (for port access).    . Multiple Vitamin (MULTIVITAMIN WITH MINERALS) TABS tablet Take 1 tablet by mouth daily.     . rivaroxaban (XARELTO) 10 MG TABS tablet Take 1 tablet (10 mg total) by mouth daily. 30 tablet 5  . senna (SENOKOT) 8.6 MG TABS tablet Take 2 tablets by mouth at bedtime.    . simvastatin (ZOCOR) 20 MG tablet Take 20 mg by mouth daily.    . temazepam (RESTORIL) 15 MG capsule Take 15 mg by mouth at bedtime as needed for sleep.      No current facility-administered medications for this visit.     PHYSICAL EXAMINATION: ECOG PERFORMANCE STATUS: 1 - Symptomatic but completely ambulatory  Vitals:   02/11/19 0938  BP: 133/71  Pulse: 62  Resp: 18  Temp: 98.6 F (37 C)  SpO2: 98%   Filed Weights   02/11/19 0938  Weight: 231 lb 3.2 oz (104.9 kg)    GENERAL:alert, in no acute distress and comfortable SKIN: no acute rashes, no significant lesions EYES: conjunctiva are pink and non-injected, sclera anicteric OROPHARYNX: MMM, no exudates, no oropharyngeal erythema or ulceration NECK: supple, no JVD LYMPH: Couple palpable right inguinal LN with a dominant 3-4cm right inguinal LN and two to three others that are 1-2cm in size. No palpable lymphadenopathy in the cervical or axillary regions LUNGS: clear to auscultation b/l with normal respiratory effort HEART: regular rate & rhythm ABDOMEN:  normoactive bowel sounds , non tender, not distended. No palpable  hepatosplenomegaly.  Extremity: no pedal edema PSYCH: alert & oriented x 3 with fluent speech NEURO: no focal motor/sensory deficits   LABORATORY DATA:   CBC Latest Ref Rng & Units 02/12/2019 02/11/2019 02/05/2019  WBC 4.0 - 10.5 K/uL 5.0 4.8 4.3  Hemoglobin 13.0 - 17.0 g/dL 13.3 13.9 14.4  Hematocrit 39.0 - 52.0 % 39.8 41.7 43.6  Platelets 150 - 400 K/uL 134(L) 139(L) 142(L)     CMP Latest Ref Rng & Units 02/12/2019 02/11/2019 02/05/2019  Glucose 70 - 99 mg/dL 96 88 90  BUN 8 - 23 mg/dL _0 Creatinine 0.61 - 1.24 mg/dL 1.08 1.06 1.13  Sodium 135 - 145 mmol/L 139 140 142  Potassium 3.5 - 5.1 mmol/L 4.1 4.3 4.5  Chloride 98 - 111 mmol/L 104 103 106  CO2 22 - 32 mmol/L _1 Calcium 8.9 - 10.3 mg/dL 8.5(L) 8.9 8.5(L)  Total Protein 6.5 - 8.1 g/dL 6.0(L) 6.5 6.6  Total Bilirubin 0.3 - 1.2 mg/dL 0.7 0.7 0.5  Alkaline Phos 38 - 126 U/L 82 87 89  AST 15 - 41 U/L _2 ALT 0 - 44 U/L _3 . Lab Results  Component Value Date   LDH 239 (H) 01/28/2019    Fine Needle Aspiration, Lymph NodeResulted: 12/04/2018 1:14 PM [facility] Result Narrative  ACCESSION NUMBER: [id number] RECEIVED: 11/27/2018 ORDERING PHYSICIAN: RAKHEE Megan Mans , MD PATIENT NAME: Ethan Stewart CYTOLOGY REPORT * Amended * Final Cytologic Interpretation AMENDED REPORT  AMENDED 12/04/2018 TO ADD DIAGNOSIS   A. Right inguinal lymph node, Fine Needle Aspiration II (smears and cell block): B-cell lymphoma with germinal center phenotype (see comment)  Specimen Adequacy: Satisfactory for evaluation.  B. Cytology core biopsy: B-cell lymphoma with germinal center phenotype (see comment)  Specimen Adequacy: Satisfactory for evaluation.    COMMENT:Sections of the core biopsy demonstrate an atypical vaguely nodular lymphoid infiltrate composed of medium sized lymphoid cells with mature chromatin, irregular nuclear membranes and scant  cytoplasm. Focally some areas show larger cells. Few mitotic figures are present. Appropriately controlled immunohistochemical stains are performed. Sheets of B-cells are highlighted by CD20. These B-cells co-express BCL2 and BCL6 (focal, nodular distribution). They are negative for CD30 and MYC. CD21 demonstrates few residual disrupted follicular dendritic meshworks. The proliferation index is low (10-20%), as demonstrated by Ki-67. Scattered small T-cells are highlighted by CD3. Concurrent flow cytometry identified a kappa light chain restricted B-cell population, co-expressing CD10.  Overall, the findings are consistent with involvement by a B-cell lymphoma with a germinal center phenotype (GC-type), with focal follicular architecture and a low proliferation index. Given the nature of the sample (i.e. core biopsy) a final architectural subclassification is not possible. If clinically feasible, an excisional  biopsy is recommended.  Correlation with clinical and cytogenetic/molecular data is recommended.    12/11/18 Inguinal LN Biopsy  Interpretation Tissue-Flow Cytometry - MONOCLONAL B-CELL POPULATION EXPRESSING CD10 COMPRISES 92% OF ALL LYMPHOCYTES - SEE COMMENT Microscopic Gated population: Flow cytometric immunophenotyping is performed using antibiodies to the antigens listed in the table below. Electronic gates are placed around a cell cluster displaying light scatter properties corresponding to lymphocytes. - Abnormal Cells in gated population: 92 % - Phenotype of Abnormal Cells: CD10, CD19, CD20, CD21, CD22, HLA-Dr, Kappa Diagnosis Lymph node for lymphoma, Deep Right Inguinal - FOLLICULAR LYMPHOMA, GRADE 1-2 - SEE COMMENT Microscopic Comment The nodal architecture is effaced by a proliferation back to back follicles composed of medium to large lymphocytes with irregular, cleaved nuclei and scattered centroblasts. There are no diffuse areas of growth present within  the examined nodal material. Immunohistochemistry performed on the tissue highlights a nodular B-cell proliferation with expression of CD20, CD10, bcl-6, and Bcl-2. CD21 highlights expanded follicular dendritic meshworks. The Ki-67 shows a slightly increased proliferation rate of 30-40%. The atypical lymphocytes are negative for CD5. CD3 immunohistochemistry highlights the background T-cells. Flow cytometry was performed. A monoclonal B-cell populaton expressing CD10 comprises 92% of all lymphocytes (See [phone]). Overall, the findings are consistent with a diagnosis of low grade follicular lymphoma, Grade 1-2. Of note, the increased ki-67 may portend a more aggressive course despite the low histologic grade.   01/18/19 BM Biopsy:     RADIOGRAPHIC STUDIES: I have personally reviewed the radiological images as listed and agreed with the findings in the report. No results found.    ASSESSMENT & PLAN:   1) Diffuse large B-cell lymphoma stage IV AE with involvement of mesenteric, inguinal lymph nodes and C5 vertebral body with spinal cord impingement  Patient is status post six cycles of R-CHOP and R-mini-CHOP, completed in 2016, as noted above.  2) History of capillary leak syndrome with ARDS requiring intubation likely due to G-CSF.  3) Newly diagnosed Follicular Lymphoma, Grade 1-2   11/02/18 CT A/P which revealed Negative for inguinal or bowel hernia but positive for bulky right inguinal and external iliac lymphadenopathy which is new since a 2017 PET-CT and highly suspicious for recurrent Lymphoma. This would be amenable to Ultrasound-guided needle biopsy. 2. No abdominal or contralateral left hemi pelvic lymphadenopathy. No other acute findings identified in the abdomen or pelvis. 3. Chronic pneumobilia.  Aortic Atherosclerosis.  11/12/18 PET/CT which revealed  Interval development of multistation lymphadenopathy in the abdomen and pelvis as detailed above- [place] 5. 2.  Presence of pneumobilia could reflect sphincter of Oddi dysfunction versus recent procedure. Correlate with clinical history and upcoming comprehensive metabolic panel. 3. Ancillary CT findings   11/27/18 US guided LN Biopsy pathology report is as noted above. " B-cell lymphoma with a germinal center phenotype (GC-type), with focal follicular architecture and a low proliferation index. Given the nature of the sample (i.e. core biopsy) a final architectural subclassification is not possible. If clinically feasible, an excisional biopsy is recommended."  12/11/18 Right Inguinal LN biopsy revealed Grade 1-2 Follicular Lymphoma  02/22/43 CT A/P revealed Today's study demonstrates clear progression of disease with increased number and size of numerous enlarged lymph nodes throughout the lower thorax, abdomen and pelvis, as detailed above. 2. In addition, there are several small pulmonary nodules in the lung bases. The largest of these are clustered in the posterior aspect of the right lower lobe in an apparent area of scarring which is very similar to prior PET-CT  03/12/2016, favored to be benign. However, the smaller nodules which measure up to 6 mm in size are nonspecific and warrant attention on follow-up studies. 3. Aortic atherosclerosis, in addition to least 2 vessel coronary artery disease. Assessment for potential risk factor modification, dietary therapy or pharmacologic therapy may be warranted, if clinically indicated. 4. Additional incidental findings, as above. 01/18/19 BM Bx revealed normocellular bone marrow with trilineage hematopoiesis  Began C1 of 16m Revlimid and Rituxan on 02/05/19  PLAN: -Discussed pt labwork today, 02/11/19; blood counts are stable, chemistries are normal -02/11/19 LDH is . Lab Results  Component Value Date   LDH 206 (H) 02/11/2019   -Pt tolerated first infusion of Rituxan well on 02/05/19 -The pt has no prohibitive toxicities from continuing Rituxan and 231m Revlimid at this time. -Planning for 4 weekly doses of Rituxan followed by monthly for 6 months -Planning for Revlimid 10-12 cycles -Recommend Zofran for nausea, Compazine as needed -Recommend Claritin or Zyrtec for mild itching, in addition to lotion application and avoid long, hot showers -Continue 1052marelto for VTE prophylaxis while on Revlimid -Stage II indicated with 01/26/19 CT -Port flush every 6 weeks -Recommend OTC Clotrimazole for inguinal yeast infection after antibiotic use -Recommended that the pt continue to eat well, drink at least 48-64 oz of water each day, and walk 20-30 minutes each day. -Will see the pt back in 3 weeks   3) History of right lower extremity distal DVT. Resolved. S/p 6 months of treatment in 02/2016 -Returning to 36m54mrelto while taking Revlimid  4) Mild chronic thrombocytopenia- platelets slightly decreased to 139K, continue monitoring.   -Please schedule Rituxan as per orders. 3 additional weekly Rituxan(rapid protocol) on 2/21 and 2/28 and 3/6 with labs -RTC with dr KaleIrene Limboh labs on 03/04/2019 -Portflush with all labs   The total time spent in the appt was 25 minutes and more than 50% was on counseling and direct patient cares.   GautSullivan LoneMS Hematology/Oncology Physician [facility] Centerffice):       [phone] cell):  [phone]):           [phone] SchuBaldwin [place] acting as a scribe for Dr. GautSullivan Lone.I have reviewed the above documentation for accuracy and completeness, and I agree with the above. .GauBrunetta Genera

## 2019-02-11 ENCOUNTER — Inpatient Hospital Stay (HOSPITAL_BASED_OUTPATIENT_CLINIC_OR_DEPARTMENT_OTHER): Payer: No Typology Code available for payment source | Admitting: Hematology

## 2019-02-11 ENCOUNTER — Inpatient Hospital Stay: Payer: No Typology Code available for payment source

## 2019-02-11 ENCOUNTER — Telehealth: Payer: Self-pay | Admitting: Hematology

## 2019-02-11 VITALS — BP 133/71 | HR 62 | Temp 98.6°F | Resp 18 | Ht 73.0 in | Wt 231.2 lb

## 2019-02-11 DIAGNOSIS — C8298 Follicular lymphoma, unspecified, lymph nodes of multiple sites: Secondary | ICD-10-CM | POA: Diagnosis not present

## 2019-02-11 DIAGNOSIS — D696 Thrombocytopenia, unspecified: Secondary | ICD-10-CM

## 2019-02-11 DIAGNOSIS — Z7901 Long term (current) use of anticoagulants: Secondary | ICD-10-CM

## 2019-02-11 DIAGNOSIS — Z95828 Presence of other vascular implants and grafts: Secondary | ICD-10-CM

## 2019-02-11 DIAGNOSIS — Z5111 Encounter for antineoplastic chemotherapy: Secondary | ICD-10-CM | POA: Diagnosis not present

## 2019-02-11 DIAGNOSIS — Z298 Encounter for other specified prophylactic measures: Secondary | ICD-10-CM

## 2019-02-11 DIAGNOSIS — Z86718 Personal history of other venous thrombosis and embolism: Secondary | ICD-10-CM

## 2019-02-11 DIAGNOSIS — C8330 Diffuse large B-cell lymphoma, unspecified site: Secondary | ICD-10-CM

## 2019-02-11 LAB — CMP (CANCER CENTER ONLY)
ALT: 24 U/L (ref 0–44)
ANION GAP: 9 (ref 5–15)
AST: 22 U/L (ref 15–41)
Albumin: 3.7 g/dL (ref 3.5–5.0)
Alkaline Phosphatase: 87 U/L (ref 38–126)
BUN: 15 mg/dL (ref 8–23)
CO2: 28 mmol/L (ref 22–32)
Calcium: 8.9 mg/dL (ref 8.9–10.3)
Chloride: 103 mmol/L (ref 98–111)
Creatinine: 1.06 mg/dL (ref 0.61–1.24)
GFR, Estimated: >60 mL/min (ref >60)
Glucose, Bld: 88 mg/dL (ref 70–99)
POTASSIUM: 4.3 mmol/L (ref 3.5–5.1)
Sodium: 140 mmol/L (ref 135–145)
Total Bilirubin: 0.7 mg/dL (ref 0.3–1.2)
Total Protein: 6.5 g/dL (ref 6.5–8.1)

## 2019-02-11 LAB — CBC WITH DIFFERENTIAL (CANCER CENTER ONLY)
Abs Immature Granulocytes: 0.01 10*3/uL (ref 0.00–0.07)
BASOS PCT: 0 %
Basophils Absolute: 0 10*3/uL (ref 0.0–0.1)
Eosinophils Absolute: 0.2 10*3/uL (ref 0.0–0.5)
Eosinophils Relative: 4 %
HCT: 41.7 % (ref 39.0–52.0)
Hemoglobin: 13.9 g/dL (ref 13.0–17.0)
Immature Granulocytes: 0 %
Lymphocytes Relative: 14 %
Lymphs Abs: 0.7 10*3/uL (ref 0.7–4.0)
MCH: 29.9 pg (ref 26.0–34.0)
MCHC: 33.3 g/dL (ref 30.0–36.0)
MCV: 89.7 fL (ref 80.0–100.0)
Monocytes Absolute: 0.6 10*3/uL (ref 0.1–1.0)
Monocytes Relative: 12 %
NRBC: 0 % (ref 0.0–0.2)
Neutro Abs: 3.4 10*3/uL (ref 1.7–7.7)
Neutrophils Relative %: 70 %
PLATELETS: 139 10*3/uL — AB (ref 150–400)
RBC: 4.65 MIL/uL (ref 4.22–5.81)
RDW: 13.8 % (ref 11.5–15.5)
WBC Count: 4.8 10*3/uL (ref 4.0–10.5)

## 2019-02-11 LAB — LACTATE DEHYDROGENASE: LDH: 206 U/L — ABNORMAL HIGH (ref 98–192)

## 2019-02-11 MED ORDER — ONDANSETRON HCL 8 MG PO TABS: 8.0000 mg | ORAL_TABLET | Freq: Three times a day (TID) | ORAL | 1 refills | Status: DC | PRN

## 2019-02-11 MED ORDER — HEPARIN SOD (PORK) LOCK FLUSH 100 UNIT/ML IV SOLN
500.0000 [IU] | Freq: Once | INTRAVENOUS | Status: AC
  Administered 2019-02-11: 500 [IU]
  Filled 2019-02-11: qty 5

## 2019-02-11 MED ORDER — PROCHLORPERAZINE MALEATE 10 MG PO TABS: 10.0000 mg | ORAL_TABLET | Freq: Three times a day (TID) | ORAL | 0 refills | Status: DC | PRN

## 2019-02-11 MED ORDER — SODIUM CHLORIDE 0.9% FLUSH
10.0000 mL | Freq: Once | INTRAVENOUS | Status: AC
  Administered 2019-02-11: 10 mL
  Filled 2019-02-11: qty 10

## 2019-02-11 NOTE — Telephone Encounter (Signed)
Gave avs and calendar ° °

## 2019-02-12 ENCOUNTER — Inpatient Hospital Stay: Payer: No Typology Code available for payment source

## 2019-02-12 VITALS — BP 136/68 | HR 62 | Temp 97.9°F | Resp 17

## 2019-02-12 DIAGNOSIS — Z5111 Encounter for antineoplastic chemotherapy: Secondary | ICD-10-CM | POA: Diagnosis not present

## 2019-02-12 DIAGNOSIS — C8298 Follicular lymphoma, unspecified, lymph nodes of multiple sites: Secondary | ICD-10-CM

## 2019-02-12 DIAGNOSIS — C8330 Diffuse large B-cell lymphoma, unspecified site: Secondary | ICD-10-CM

## 2019-02-12 DIAGNOSIS — Z95828 Presence of other vascular implants and grafts: Secondary | ICD-10-CM

## 2019-02-12 LAB — CBC WITH DIFFERENTIAL/PLATELET
Abs Immature Granulocytes: 0.02 10*3/uL (ref 0.00–0.07)
Basophils Absolute: 0 10*3/uL (ref 0.0–0.1)
Basophils Relative: 0 %
Eosinophils Absolute: 0.2 10*3/uL (ref 0.0–0.5)
Eosinophils Relative: 4 %
HEMATOCRIT: 39.8 % (ref 39.0–52.0)
Hemoglobin: 13.3 g/dL (ref 13.0–17.0)
Immature Granulocytes: 0 %
Lymphocytes Relative: 12 %
Lymphs Abs: 0.6 10*3/uL — ABNORMAL LOW (ref 0.7–4.0)
MCH: 29.7 pg (ref 26.0–34.0)
MCHC: 33.4 g/dL (ref 30.0–36.0)
MCV: 88.8 fL (ref 80.0–100.0)
Monocytes Absolute: 0.6 10*3/uL (ref 0.1–1.0)
Monocytes Relative: 12 %
Neutro Abs: 3.6 10*3/uL (ref 1.7–7.7)
Neutrophils Relative %: 72 %
Platelets: 134 10*3/uL — ABNORMAL LOW (ref 150–400)
RBC: 4.48 MIL/uL (ref 4.22–5.81)
RDW: 13.5 % (ref 11.5–15.5)
WBC: 5 10*3/uL (ref 4.0–10.5)
nRBC: 0 % (ref 0.0–0.2)

## 2019-02-12 LAB — CMP (CANCER CENTER ONLY)
ALT: 20 U/L (ref 0–44)
AST: 18 U/L (ref 15–41)
Albumin: 3.5 g/dL (ref 3.5–5.0)
Alkaline Phosphatase: 82 U/L (ref 38–126)
Anion gap: 8 (ref 5–15)
BUN: 15 mg/dL (ref 8–23)
CO2: 27 mmol/L (ref 22–32)
Calcium: 8.5 mg/dL — ABNORMAL LOW (ref 8.9–10.3)
Chloride: 104 mmol/L (ref 98–111)
Creatinine: 1.08 mg/dL (ref 0.61–1.24)
GFR, Est AFR Am: >60 mL/min (ref >60)
GFR, Estimated: >60 mL/min (ref >60)
Glucose, Bld: 96 mg/dL (ref 70–99)
Potassium: 4.1 mmol/L (ref 3.5–5.1)
Sodium: 139 mmol/L (ref 135–145)
Total Bilirubin: 0.7 mg/dL (ref 0.3–1.2)
Total Protein: 6 g/dL — ABNORMAL LOW (ref 6.5–8.1)

## 2019-02-12 MED ORDER — HEPARIN SOD (PORK) LOCK FLUSH 100 UNIT/ML IV SOLN
500.0000 [IU] | Freq: Once | INTRAVENOUS | Status: AC | PRN
  Administered 2019-02-12: 500 [IU]
  Filled 2019-02-12: qty 5

## 2019-02-12 MED ORDER — SODIUM CHLORIDE 0.9 % IV SOLN
Freq: Once | INTRAVENOUS | Status: AC
  Administered 2019-02-12: 10:00:00 via INTRAVENOUS
  Filled 2019-02-12: qty 250

## 2019-02-12 MED ORDER — SODIUM CHLORIDE 0.9% FLUSH
10.0000 mL | Freq: Once | INTRAVENOUS | Status: AC
  Administered 2019-02-12: 10 mL
  Filled 2019-02-12: qty 10

## 2019-02-12 MED ORDER — METHYLPREDNISOLONE SODIUM SUCC 125 MG IJ SOLR
INTRAMUSCULAR | Status: AC
  Filled 2019-02-12: qty 2

## 2019-02-12 MED ORDER — SODIUM CHLORIDE 0.9 % IV SOLN: 375.0000 mg/m2 | Freq: Once | INTRAVENOUS | Status: DC

## 2019-02-12 MED ORDER — ACETAMINOPHEN 325 MG PO TABS
650.0000 mg | ORAL_TABLET | Freq: Once | ORAL | Status: AC
  Administered 2019-02-12: 650 mg via ORAL

## 2019-02-12 MED ORDER — SODIUM CHLORIDE 0.9 % IV SOLN
375.0000 mg/m2 | Freq: Once | INTRAVENOUS | Status: AC
  Administered 2019-02-12: 900 mg via INTRAVENOUS
  Filled 2019-02-12: qty 40

## 2019-02-12 MED ORDER — ALTEPLASE 2 MG IJ SOLR
INTRAMUSCULAR | Status: AC
  Filled 2019-02-12: qty 2

## 2019-02-12 MED ORDER — ACETAMINOPHEN 325 MG PO TABS
ORAL_TABLET | ORAL | Status: AC
  Filled 2019-02-12: qty 2

## 2019-02-12 MED ORDER — SODIUM CHLORIDE 0.9% FLUSH
10.0000 mL | INTRAVENOUS | Status: DC | PRN
  Administered 2019-02-12: 10 mL
  Filled 2019-02-12: qty 10

## 2019-02-12 MED ORDER — METHYLPREDNISOLONE SODIUM SUCC 125 MG IJ SOLR
125.0000 mg | Freq: Once | INTRAMUSCULAR | Status: AC
  Administered 2019-02-12: 125 mg via INTRAVENOUS

## 2019-02-12 MED ORDER — DIPHENHYDRAMINE HCL 25 MG PO CAPS
50.0000 mg | ORAL_CAPSULE | Freq: Once | ORAL | Status: AC
  Administered 2019-02-12: 50 mg via ORAL

## 2019-02-12 MED ORDER — ALTEPLASE 2 MG IJ SOLR
2.0000 mg | Freq: Once | INTRAMUSCULAR | Status: AC | PRN
  Administered 2019-02-12: 2 mg
  Filled 2019-02-12: qty 2

## 2019-02-12 MED ORDER — DIPHENHYDRAMINE HCL 25 MG PO CAPS
ORAL_CAPSULE | ORAL | Status: AC
  Filled 2019-02-12: qty 2

## 2019-02-12 NOTE — Progress Notes (Signed)
Notified by Porsche Cates-LPN that pt's lateral port was unable to produce blood return. Porsche accessed medial port and was able to obtain blood return to draw labs. Lateral port was left accessed and CathFlow was instilled at 08:48. Will recheck for blood return at 09:18  At 09:21 after several minutes of port-aerobics, only able to obtain 58mL of blood/CathFlow from lateral port. Unable to fully remove all 104mL of CathFlow, so lateral port de-accessed and medial port re-accessed. Blood return observed immediately and treatment orders released.

## 2019-02-12 NOTE — Patient Instructions (Signed)
[facility] °Discharge Instructions for Patients Receiving Chemotherapy ° °Today you received the following chemotherapy agents: Rituximab (Rituxan) ° °To help prevent nausea and vomiting after your treatment, we encourage you to take your nausea medication as directed.  °  °If you develop nausea and vomiting that is not controlled by your nausea medication, call the clinic.  ° °BELOW ARE SYMPTOMS THAT SHOULD BE REPORTED IMMEDIATELY: °· *FEVER GREATER THAN 100.5 F °· *CHILLS WITH OR WITHOUT FEVER °· NAUSEA AND VOMITING THAT IS NOT CONTROLLED WITH YOUR NAUSEA MEDICATION °· *UNUSUAL SHORTNESS OF BREATH °· *UNUSUAL BRUISING OR BLEEDING °· TENDERNESS IN MOUTH AND THROAT WITH OR WITHOUT PRESENCE OF ULCERS °· *URINARY PROBLEMS °· *BOWEL PROBLEMS °· UNUSUAL RASH °Items with * indicate a potential emergency and should be followed up as soon as possible. ° °Feel free to call the clinic should you have any questions or concerns. The clinic phone number is [phone]. ° °Please show the CHEMO ALERT CARD at check-in to the Emergency Department and triage nurse. ° ° °

## 2019-02-16 ENCOUNTER — Other Ambulatory Visit: Payer: Self-pay | Admitting: *Deleted

## 2019-02-16 MED ORDER — LENALIDOMIDE 20 MG PO CAPS: 20.0000 mg | ORAL_CAPSULE | Freq: Every day | ORAL | 0 refills | Status: DC

## 2019-02-16 NOTE — Telephone Encounter (Signed)
Refill Revlimid - [place]  Was sent as requested by [facility] to Fax # [phone], office cover sheet stating referral #: [place] [id number]

## 2019-02-17 ENCOUNTER — Telehealth: Payer: Self-pay | Admitting: Hematology

## 2019-02-17 ENCOUNTER — Telehealth: Payer: Self-pay | Admitting: *Deleted

## 2019-02-17 DIAGNOSIS — Z7901 Long term (current) use of anticoagulants: Secondary | ICD-10-CM | POA: Diagnosis not present

## 2019-02-17 DIAGNOSIS — Z85828 Personal history of other malignant neoplasm of skin: Secondary | ICD-10-CM | POA: Diagnosis not present

## 2019-02-17 DIAGNOSIS — Z809 Family history of malignant neoplasm, unspecified: Secondary | ICD-10-CM | POA: Diagnosis not present

## 2019-02-17 DIAGNOSIS — Z86718 Personal history of other venous thrombosis and embolism: Secondary | ICD-10-CM | POA: Diagnosis not present

## 2019-02-17 DIAGNOSIS — Z7722 Contact with and (suspected) exposure to environmental tobacco smoke (acute) (chronic): Secondary | ICD-10-CM | POA: Diagnosis not present

## 2019-02-17 DIAGNOSIS — C8290 Follicular lymphoma, unspecified, unspecified site: Secondary | ICD-10-CM | POA: Diagnosis not present

## 2019-02-17 DIAGNOSIS — E039 Hypothyroidism, unspecified: Secondary | ICD-10-CM | POA: Diagnosis not present

## 2019-02-17 DIAGNOSIS — G4700 Insomnia, unspecified: Secondary | ICD-10-CM | POA: Diagnosis not present

## 2019-02-17 DIAGNOSIS — R69 Illness, unspecified: Secondary | ICD-10-CM | POA: Diagnosis not present

## 2019-02-17 DIAGNOSIS — Z8249 Family history of ischemic heart disease and other diseases of the circulatory system: Secondary | ICD-10-CM | POA: Diagnosis not present

## 2019-02-17 NOTE — Telephone Encounter (Signed)
R/s apt per 2/26 sch message - pt is aware of appt date and time

## 2019-02-17 NOTE — Telephone Encounter (Signed)
Patient states he has a family obligation on 3/12 and would like to move all his appointments to 3/13 if ok with Dr. Irene Limbo and if possible to move appointments. Per MD it is ok to move appts one day.  Contacted patient to inform that ok with MD and that msg will be sent to scheduling - they will contact him. Patient verbalized understanding.

## 2019-02-17 NOTE — Telephone Encounter (Signed)
Received faxed form from [place] at [facility] in [place] as follow up to sending prescription for Revlimid to them.  Form completed and faxed back to [facility] - fax confirmation received.

## 2019-02-19 ENCOUNTER — Inpatient Hospital Stay: Payer: No Typology Code available for payment source

## 2019-02-19 VITALS — BP 119/63 | HR 60 | Temp 97.7°F | Resp 18

## 2019-02-19 DIAGNOSIS — C8330 Diffuse large B-cell lymphoma, unspecified site: Secondary | ICD-10-CM

## 2019-02-19 DIAGNOSIS — Z95828 Presence of other vascular implants and grafts: Secondary | ICD-10-CM

## 2019-02-19 DIAGNOSIS — C8298 Follicular lymphoma, unspecified, lymph nodes of multiple sites: Secondary | ICD-10-CM

## 2019-02-19 DIAGNOSIS — Z5111 Encounter for antineoplastic chemotherapy: Secondary | ICD-10-CM | POA: Diagnosis not present

## 2019-02-19 LAB — CMP (CANCER CENTER ONLY)
ALBUMIN: 3.5 g/dL (ref 3.5–5.0)
ALT: 23 U/L (ref 0–44)
ANION GAP: 8 (ref 5–15)
AST: 24 U/L (ref 15–41)
Alkaline Phosphatase: 74 U/L (ref 38–126)
BILIRUBIN TOTAL: 0.6 mg/dL (ref 0.3–1.2)
BUN: 16 mg/dL (ref 8–23)
CALCIUM: 8.9 mg/dL (ref 8.9–10.3)
CO2: 28 mmol/L (ref 22–32)
CREATININE: 1.14 mg/dL (ref 0.61–1.24)
Chloride: 105 mmol/L (ref 98–111)
GFR, Est AFR Am: >60 mL/min (ref >60)
GFR, Estimated: >60 mL/min (ref >60)
Glucose, Bld: 91 mg/dL (ref 70–99)
Potassium: 4.1 mmol/L (ref 3.5–5.1)
Sodium: 141 mmol/L (ref 135–145)
Total Protein: 6.1 g/dL — ABNORMAL LOW (ref 6.5–8.1)

## 2019-02-19 LAB — CBC WITH DIFFERENTIAL/PLATELET
Abs Immature Granulocytes: 0.02 10*3/uL (ref 0.00–0.07)
BASOS ABS: 0 10*3/uL (ref 0.0–0.1)
Basophils Relative: 1 %
EOS PCT: 5 %
Eosinophils Absolute: 0.2 10*3/uL (ref 0.0–0.5)
HCT: 38.6 % — ABNORMAL LOW (ref 39.0–52.0)
Hemoglobin: 13 g/dL (ref 13.0–17.0)
Immature Granulocytes: 0 %
Lymphocytes Relative: 22 %
Lymphs Abs: 1 10*3/uL (ref 0.7–4.0)
MCH: 30 pg (ref 26.0–34.0)
MCHC: 33.7 g/dL (ref 30.0–36.0)
MCV: 89.1 fL (ref 80.0–100.0)
Monocytes Absolute: 0.8 10*3/uL (ref 0.1–1.0)
Monocytes Relative: 18 %
Neutro Abs: 2.5 10*3/uL (ref 1.7–7.7)
Neutrophils Relative %: 54 %
Platelets: 142 10*3/uL — ABNORMAL LOW (ref 150–400)
RBC: 4.33 MIL/uL (ref 4.22–5.81)
RDW: 13.3 % (ref 11.5–15.5)
WBC: 4.7 10*3/uL (ref 4.0–10.5)
nRBC: 0 % (ref 0.0–0.2)

## 2019-02-19 MED ORDER — SODIUM CHLORIDE 0.9 % IV SOLN
Freq: Once | INTRAVENOUS | Status: AC
  Administered 2019-02-19: 09:00:00 via INTRAVENOUS
  Filled 2019-02-19: qty 250

## 2019-02-19 MED ORDER — SODIUM CHLORIDE 0.9 % IV SOLN
375.0000 mg/m2 | Freq: Once | INTRAVENOUS | Status: AC
  Administered 2019-02-19: 900 mg via INTRAVENOUS
  Filled 2019-02-19: qty 50

## 2019-02-19 MED ORDER — METHYLPREDNISOLONE SODIUM SUCC 125 MG IJ SOLR
125.0000 mg | Freq: Every day | INTRAMUSCULAR | Status: DC
  Administered 2019-02-19: 125 mg via INTRAVENOUS

## 2019-02-19 MED ORDER — METHYLPREDNISOLONE SODIUM SUCC 125 MG IJ SOLR
INTRAMUSCULAR | Status: AC
  Filled 2019-02-19: qty 2

## 2019-02-19 MED ORDER — METHYLPREDNISOLONE SODIUM SUCC 40 MG IJ SOLR
INTRAMUSCULAR | Status: AC
  Filled 2019-02-19: qty 1

## 2019-02-19 MED ORDER — SODIUM CHLORIDE 0.9% FLUSH
10.0000 mL | Freq: Once | INTRAVENOUS | Status: AC
  Administered 2019-02-19: 10 mL
  Filled 2019-02-19: qty 10

## 2019-02-19 MED ORDER — ACETAMINOPHEN 325 MG PO TABS
650.0000 mg | ORAL_TABLET | Freq: Once | ORAL | Status: AC
  Administered 2019-02-19: 650 mg via ORAL

## 2019-02-19 MED ORDER — DIPHENHYDRAMINE HCL 25 MG PO CAPS
50.0000 mg | ORAL_CAPSULE | Freq: Once | ORAL | Status: AC
  Administered 2019-02-19: 50 mg via ORAL

## 2019-02-19 MED ORDER — DIPHENHYDRAMINE HCL 25 MG PO CAPS
ORAL_CAPSULE | ORAL | Status: AC
  Filled 2019-02-19: qty 2

## 2019-02-19 MED ORDER — HEPARIN SOD (PORK) LOCK FLUSH 100 UNIT/ML IV SOLN
500.0000 [IU] | Freq: Once | INTRAVENOUS | Status: AC | PRN
  Administered 2019-02-19: 500 [IU]
  Filled 2019-02-19: qty 5

## 2019-02-19 MED ORDER — ACETAMINOPHEN 325 MG PO TABS
ORAL_TABLET | ORAL | Status: AC
  Filled 2019-02-19: qty 2

## 2019-02-19 MED ORDER — SODIUM CHLORIDE 0.9% FLUSH
10.0000 mL | INTRAVENOUS | Status: DC | PRN
  Administered 2019-02-19: 10 mL
  Filled 2019-02-19: qty 10

## 2019-02-19 NOTE — Patient Instructions (Signed)
[facility] Discharge Instructions for Patients Receiving Chemotherapy  Today you received the following chemotherapy agents:  Rituxan   To help prevent nausea and vomiting after your treatment, we encourage you to take your nausea medication as prescribed.   If you develop nausea and vomiting that is not controlled by your nausea medication, call the clinic.   BELOW ARE SYMPTOMS THAT SHOULD BE REPORTED IMMEDIATELY:  *FEVER GREATER THAN 100.5 F  *CHILLS WITH OR WITHOUT FEVER  NAUSEA AND VOMITING THAT IS NOT CONTROLLED WITH YOUR NAUSEA MEDICATION  *UNUSUAL SHORTNESS OF BREATH  *UNUSUAL BRUISING OR BLEEDING  TENDERNESS IN MOUTH AND THROAT WITH OR WITHOUT PRESENCE OF ULCERS  *URINARY PROBLEMS  *BOWEL PROBLEMS  UNUSUAL RASH Items with * indicate a potential emergency and should be followed up as soon as possible.  Feel free to call the clinic should you have any questions or concerns. The clinic phone number is [phone].  Please show the CHEMO ALERT CARD at check-in to the Emergency Department and triage nurse.   

## 2019-02-26 ENCOUNTER — Inpatient Hospital Stay: Payer: No Typology Code available for payment source | Attending: Hematology

## 2019-02-26 ENCOUNTER — Inpatient Hospital Stay: Payer: No Typology Code available for payment source

## 2019-02-26 VITALS — BP 113/56 | HR 53 | Temp 97.6°F | Resp 16

## 2019-02-26 DIAGNOSIS — Z5112 Encounter for antineoplastic immunotherapy: Secondary | ICD-10-CM | POA: Insufficient documentation

## 2019-02-26 DIAGNOSIS — C8298 Follicular lymphoma, unspecified, lymph nodes of multiple sites: Secondary | ICD-10-CM

## 2019-02-26 DIAGNOSIS — C8330 Diffuse large B-cell lymphoma, unspecified site: Secondary | ICD-10-CM

## 2019-02-26 DIAGNOSIS — Z95828 Presence of other vascular implants and grafts: Secondary | ICD-10-CM

## 2019-02-26 LAB — CBC WITH DIFFERENTIAL/PLATELET
ABS IMMATURE GRANULOCYTES: 0.01 10*3/uL (ref 0.00–0.07)
Basophils Absolute: 0 10*3/uL (ref 0.0–0.1)
Basophils Relative: 1 %
Eosinophils Absolute: 0.3 10*3/uL (ref 0.0–0.5)
Eosinophils Relative: 9 %
HCT: 40.6 % (ref 39.0–52.0)
Hemoglobin: 13.4 g/dL (ref 13.0–17.0)
IMMATURE GRANULOCYTES: 0 %
Lymphocytes Relative: 23 %
Lymphs Abs: 0.8 10*3/uL (ref 0.7–4.0)
MCH: 29.5 pg (ref 26.0–34.0)
MCHC: 33 g/dL (ref 30.0–36.0)
MCV: 89.2 fL (ref 80.0–100.0)
Monocytes Absolute: 0.6 10*3/uL (ref 0.1–1.0)
Monocytes Relative: 18 %
NEUTROS ABS: 1.7 10*3/uL (ref 1.7–7.7)
NRBC: 0 % (ref 0.0–0.2)
Neutrophils Relative %: 49 %
Platelets: 161 10*3/uL (ref 150–400)
RBC: 4.55 MIL/uL (ref 4.22–5.81)
RDW: 13.5 % (ref 11.5–15.5)
WBC: 3.5 10*3/uL — ABNORMAL LOW (ref 4.0–10.5)

## 2019-02-26 LAB — COMPREHENSIVE METABOLIC PANEL
ALT: 28 U/L (ref 0–44)
AST: 27 U/L (ref 15–41)
Albumin: 3.7 g/dL (ref 3.5–5.0)
Alkaline Phosphatase: 65 U/L (ref 38–126)
Anion gap: 8 (ref 5–15)
BUN: 22 mg/dL (ref 8–23)
CO2: 25 mmol/L (ref 22–32)
Calcium: 8.7 mg/dL — ABNORMAL LOW (ref 8.9–10.3)
Chloride: 106 mmol/L (ref 98–111)
Creatinine, Ser: 1.11 mg/dL (ref 0.61–1.24)
GFR calc Af Amer: >60 mL/min (ref >60)
GFR calc non Af Amer: >60 mL/min (ref >60)
Glucose, Bld: 98 mg/dL (ref 70–99)
Potassium: 3.8 mmol/L (ref 3.5–5.1)
Sodium: 139 mmol/L (ref 135–145)
Total Bilirubin: 0.6 mg/dL (ref 0.3–1.2)
Total Protein: 6.1 g/dL — ABNORMAL LOW (ref 6.5–8.1)

## 2019-02-26 MED ORDER — ACETAMINOPHEN 325 MG PO TABS
650.0000 mg | ORAL_TABLET | Freq: Once | ORAL | Status: AC
  Administered 2019-02-26: 650 mg via ORAL

## 2019-02-26 MED ORDER — SODIUM CHLORIDE 0.9% FLUSH
10.0000 mL | INTRAVENOUS | Status: DC | PRN
  Administered 2019-02-26: 10 mL
  Filled 2019-02-26: qty 10

## 2019-02-26 MED ORDER — HEPARIN SOD (PORK) LOCK FLUSH 100 UNIT/ML IV SOLN
500.0000 [IU] | Freq: Once | INTRAVENOUS | Status: AC | PRN
  Administered 2019-02-26: 500 [IU]
  Filled 2019-02-26: qty 5

## 2019-02-26 MED ORDER — SODIUM CHLORIDE 0.9% FLUSH
10.0000 mL | Freq: Once | INTRAVENOUS | Status: AC
  Administered 2019-02-26: 10 mL
  Filled 2019-02-26: qty 10

## 2019-02-26 MED ORDER — DIPHENHYDRAMINE HCL 25 MG PO CAPS
50.0000 mg | ORAL_CAPSULE | Freq: Once | ORAL | Status: AC
  Administered 2019-02-26: 50 mg via ORAL

## 2019-02-26 MED ORDER — METHYLPREDNISOLONE SODIUM SUCC 125 MG IJ SOLR
INTRAMUSCULAR | Status: AC
  Filled 2019-02-26: qty 2

## 2019-02-26 MED ORDER — DIPHENHYDRAMINE HCL 25 MG PO CAPS
ORAL_CAPSULE | ORAL | Status: AC
  Filled 2019-02-26: qty 2

## 2019-02-26 MED ORDER — ACETAMINOPHEN 325 MG PO TABS
ORAL_TABLET | ORAL | Status: AC
  Filled 2019-02-26: qty 2

## 2019-02-26 MED ORDER — SODIUM CHLORIDE 0.9 % IV SOLN
Freq: Once | INTRAVENOUS | Status: AC
  Administered 2019-02-26: 09:00:00 via INTRAVENOUS
  Filled 2019-02-26: qty 250

## 2019-02-26 MED ORDER — METHYLPREDNISOLONE SODIUM SUCC 125 MG IJ SOLR
125.0000 mg | Freq: Once | INTRAMUSCULAR | Status: AC
  Administered 2019-02-26: 125 mg via INTRAVENOUS

## 2019-02-26 MED ORDER — SODIUM CHLORIDE 0.9 % IV SOLN
375.0000 mg/m2 | Freq: Once | INTRAVENOUS | Status: AC
  Administered 2019-02-26: 900 mg via INTRAVENOUS
  Filled 2019-02-26: qty 50

## 2019-03-04 ENCOUNTER — Other Ambulatory Visit: Payer: Self-pay

## 2019-03-04 ENCOUNTER — Ambulatory Visit: Payer: Self-pay

## 2019-03-04 ENCOUNTER — Ambulatory Visit: Payer: Self-pay | Admitting: Hematology

## 2019-03-04 NOTE — Progress Notes (Signed)
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 Cytogenetics and Fish showed BCL 2 positivity but negative for BCL 6 and cMYC CD10 positivity suggestive of possibly GCB subtype.  Patient received first cycle of R CHOP on 08/08/2015 and intrathecal methotrexate with hydrocortisone on 08/09/2015. Patient subsequently was admitted with ARDS likely due to neulasta -treated with high dose steroids and empirically with broad spectrum antibiotics.  2nd cycle of chemotherapy delayed to allow for rehabilitation (was due on 08/31/2015).  Received R-mini-CHOP for cycle 2 and tolerated it without significant cytopenias. Was admitted briefly for a mild lower extremity cellulitis and DVT. Cellulitis now resolved. Patient completing his oral antibiotics.  PET/CT scan on 09/29/2015 with no evidence of disease progression. Good response in his mesenteric lymph nodes. Inguinal  lymph nodes on the right side. Decreased uptake in his C5 level.  No new lesions noted.   Cycle 3 (10/02/2015)  of R- CHOP ( we increased the doxorubicin dose back to 50 mg/m and keep the cyclophosphamide dose reduction at 400 mg/m]  Cycle 4 (10/23/2015) of R-CHOP ( we increased the doxorubicin dose back to 50 mg/m and kept the cyclophosphamide dose reduction at 400 mg/m].  PET/CT 11/10/2015: Shows improvement in most lesions but is noted to have a new FDG avid lymph node in the mesentery of the small bowel.  Cycle 5 on 11/13/2015 R CHOP - received full dose.  Cycle 6 on 12/11/2015 R-CHOP full dose.  Admitted with neutropenic fevers and treatment for possible early pneumonia.  This resolved and he was able to discharge home  01/09/2016 through 01/22/2016: The patient was treated to the C4-C6 levels of the spine to a dose of 30 gray in 10 fractions using a 2 field technique. The patient was also treated to an abdominal lymph node region to a dose of 30 gray in 10 fractions using a 3 field technique. This treatment consisted of a 3-D conformal technique with daily image guidance utilized.   Repeat PET CT scan in March 12 2016 -showed some enlarged mesenteric lymph nodes which were FDG avid.  CT guided FNA of mesenteric lymph nodes at [facility] on 03/29/2016 - showed scant cellularity. No overt evidence of lymphoma.  Rpt PET/CT 05/15/2016 at [facility]- show similar appearing enlarged mesenteric lymph nodes which are FDG avid. No change in size and no other evidence of disease progression.  PET/CT 07/24/2016 - and [facility] shows no evidence of disease progression  PET/CT 01/22/2017:  Result Impression   1.Stable low-level metabolic activity in small lymph nodes in the small bowel mesentery. No new FDG avid disease. 2.Persistent hypermetabolic soft tissue thickening overlying the coccyx which could relate to a decubitus ulcer and should be  amenable to direct inspection. 3.Stable pulmonary nodules which do not appear hypermetabolic, however, several are below the size resolution of PET in size. Continued attention on follow-up is suggested. 4.Previously seen hypermetabolic skin thickening near the right ear has resolved. 5.Ancillary CT findings as above.    INTERVAL HISTORY:   Mr. Mayberry is here for management and evaluation of [name], grade 1-2. The patient's last visit with [place] was on 02/11/19. He is accompanied today by his wife. The pt reports that he is doing well overall.   The pt reports that his inguinal lymph node has continued to decrease in size, and he can no longer feel it. He denies nausea or vomiting and notes that he is tolerating Revlimid very well. He has continued to play golf and is staying active. The pt endorses stable energy levels.   The pt notes that he has developed a "hard lump" on his bottom, in the location of his previous bed sore. He noticed this one week ago and notes that it is a little tender. The pt endorses sitting exercises that cause friction in this area. He notes that this lump has decreased in size since one week ago, and is not painful.  Lab results today (03/05/19) of CBC w/diff is as follows: all values are WNL except for WBC at 3.9k. 03/05/19 CMP is pending  On review of systems, pt reports stable energy levels, small lump on bottom, normalized inguinal lymph node, staying active, eating well, and denies abdominal pains, and any other symptoms.   REVIEW OF SYSTEMS:    A 10+ POINT REVIEW OF SYSTEMS WAS OBTAINED including neurology, dermatology, psychiatry, cardiac, respiratory, lymph, extremities, GI, GU, Musculoskeletal, constitutional, breasts, reproductive, HEENT.  All pertinent positives are noted in the HPI.  All others are negative.   ALLERGIES:  is allergic to neulasta [pegfilgrastim].  MEDICATIONS:  Current Outpatient Medications    Medication Sig Dispense Refill   diazepam (VALIUM) 2 MG tablet Take 2 mg by mouth daily as needed for anxiety.     HYDROcodone-acetaminophen (NORCO) 5-325 MG tablet Take 1-2 tablets by mouth every 6 (six) hours as needed for moderate pain or severe pain. (Patient not taking: Reported on 12/17/2018) 20 tablet 0   lenalidomide (REVLIMID) 20 MG capsule Take 1 capsule (20 mg total) by mouth daily. 21 days on 7 days off. Swallow whole.  Do not Crush. 21 capsule 0   levothyroxine (SYNTHROID, LEVOTHROID) 88 MCG tablet Take 88 mcg by mouth daily before breakfast.   2   lidocaine-prilocaine (EMLA) cream Apply 1 application topically as needed (for port access).     Multiple Vitamin (MULTIVITAMIN WITH MINERALS) TABS tablet Take 1 tablet by mouth daily.      ondansetron (ZOFRAN) 8 MG tablet Take 1 tablet (8 mg total) by mouth every 8 (eight) hours as needed for nausea or vomiting. 30 tablet 1   prochlorperazine (COMPAZINE) 10 MG tablet Take 1 tablet (10 mg total) by mouth every 8 (eight) hours as needed for nausea or vomiting. 30 tablet 0   rivaroxaban (XARELTO) 10 MG TABS tablet Take 1 tablet (10 mg total) by mouth daily. 30 tablet 5   senna (SENOKOT) 8.6 MG TABS tablet Take 2 tablets by mouth at bedtime.  simvastatin (ZOCOR) 20 MG tablet Take 20 mg by mouth daily.     temazepam (RESTORIL) 15 MG capsule Take 15 mg by mouth at bedtime as needed for sleep.      No current facility-administered medications for this visit.     PHYSICAL EXAMINATION: ECOG PERFORMANCE STATUS: 1 - Symptomatic but completely ambulatory  Vitals:   03/05/19 1011  BP: 131/72  Pulse: (!) 56  Resp: 17  Temp: 98 F (36.7 C)  SpO2: 99%   Filed Weights   03/05/19 1011  Weight: 224 lb 6.4 oz (101.8 kg)    GENERAL:alert, in no acute distress and comfortable SKIN: no acute rashes, no significant lesions EYES: conjunctiva are pink and non-injected, sclera anicteric OROPHARYNX: MMM, no exudates, no  oropharyngeal erythema or ulceration NECK: supple, no JVD LYMPH: No palpable lymphadenopathy in the cervical, inguinal or axillary regions LUNGS: clear to auscultation b/l with normal respiratory effort HEART: regular rate & rhythm ABDOMEN:  normoactive bowel sounds , non tender, not distended. No palpable hepatosplenomegaly.  Extremity: no pedal edema PSYCH: alert & oriented x 3 with fluent speech NEURO: no focal motor/sensory deficits   LABORATORY DATA:   CBC Latest Ref Rng & Units 03/05/2019 02/26/2019 02/19/2019  WBC 4.0 - 10.5 K/uL 3.9(L) 3.5(L) 4.7  Hemoglobin 13.0 - 17.0 g/dL 13.3 13.4 13.0  Hematocrit 39.0 - 52.0 % 40.7 40.6 38.6(L)  Platelets 150 - 400 K/uL 175 161 142(L)     CMP Latest Ref Rng & Units 03/05/2019 02/26/2019 02/19/2019  Glucose 70 - 99 mg/dL 77 98 91  BUN 8 - 23 mg/dL _0 Creatinine 0.61 - 1.24 mg/dL 1.01 1.11 1.14  Sodium 135 - 145 mmol/L 142 139 141  Potassium 3.5 - 5.1 mmol/L 4.2 3.8 4.1  Chloride 98 - 111 mmol/L 109 106 105  CO2 22 - 32 mmol/L _1 Calcium 8.9 - 10.3 mg/dL 8.6(L) 8.7(L) 8.9  Total Protein 6.5 - 8.1 g/dL 6.2(L) 6.1(L) 6.1(L)  Total Bilirubin 0.3 - 1.2 mg/dL 0.6 0.6 0.6  Alkaline Phos 38 - 126 U/L 74 65 74  AST 15 - 41 U/L _2 ALT 0 - 44 U/L _3 . Lab Results  Component Value Date   LDH 206 (H) 02/11/2019    Fine Needle Aspiration, Lymph NodeResulted: 12/04/2018 1:14 PM [facility] Result Narrative  ACCESSION NUMBER: [id number] RECEIVED: 11/27/2018 ORDERING PHYSICIAN: RAKHEE Megan Mans , MD PATIENT NAME: Docia Barrier LYN CYTOLOGY REPORT * Amended * Final Cytologic Interpretation AMENDED REPORT  AMENDED 12/04/2018 TO ADD DIAGNOSIS   A. Right inguinal lymph node, Fine Needle Aspiration II (smears and cell block): B-cell lymphoma with germinal center phenotype (see comment)  Specimen Adequacy: Satisfactory for evaluation.  B. Cytology core biopsy: B-cell  lymphoma with germinal center phenotype (see comment)  Specimen Adequacy: Satisfactory for evaluation.    COMMENT:Sections of the core biopsy demonstrate an atypical vaguely nodular lymphoid infiltrate composed of medium sized lymphoid cells with mature chromatin, irregular nuclear membranes and scant cytoplasm. Focally some areas show larger cells. Few mitotic figures are present. Appropriately controlled immunohistochemical stains are performed. Sheets of B-cells are highlighted by CD20. These B-cells co-express BCL2 and BCL6 (focal, nodular distribution). They are negative for CD30 and MYC. CD21 demonstrates few residual disrupted follicular dendritic meshworks. The proliferation index is low (10-20%), as demonstrated by Ki-67. Scattered small T-cells are highlighted by CD3. Concurrent flow cytometry identified a kappa light chain restricted B-cell  population, co-expressing CD10.  Overall, the findings are consistent with involvement by a B-cell lymphoma with a germinal center phenotype (GC-type), with focal follicular architecture and a low proliferation index. Given the nature of the sample (i.e. core biopsy) a final architectural subclassification is not possible. If clinically feasible, an excisional biopsy is recommended.  Correlation with clinical and cytogenetic/molecular data is recommended.    12/11/18 Inguinal LN Biopsy  Interpretation Tissue-Flow Cytometry - MONOCLONAL B-CELL POPULATION EXPRESSING CD10 COMPRISES 92% OF ALL LYMPHOCYTES - SEE COMMENT Microscopic Gated population: Flow cytometric immunophenotyping is performed using antibiodies to the antigens listed in the table below. Electronic gates are placed around a cell cluster displaying light scatter properties corresponding to lymphocytes. - Abnormal Cells in gated population: 92 % - Phenotype of Abnormal Cells: CD10, CD19, CD20, CD21, CD22, HLA-Dr, Kappa Diagnosis Lymph node for lymphoma, Deep  Right Inguinal - FOLLICULAR LYMPHOMA, GRADE 1-2 - SEE COMMENT Microscopic Comment The nodal architecture is effaced by a proliferation back to back follicles composed of medium to large lymphocytes with irregular, cleaved nuclei and scattered centroblasts. There are no diffuse areas of growth present within the examined nodal material. Immunohistochemistry performed on the tissue highlights a nodular B-cell proliferation with expression of CD20, CD10, bcl-6, and Bcl-2. CD21 highlights expanded follicular dendritic meshworks. The Ki-67 shows a slightly increased proliferation rate of 30-40%. The atypical lymphocytes are negative for CD5. CD3 immunohistochemistry highlights the background T-cells. Flow cytometry was performed. A monoclonal B-cell populaton expressing CD10 comprises 92% of all lymphocytes (See [phone]). Overall, the findings are consistent with a diagnosis of low grade follicular lymphoma, Grade 1-2. Of note, the increased ki-67 may portend a more aggressive course despite the low histologic grade.   01/18/19 BM Biopsy:     RADIOGRAPHIC STUDIES: I have personally reviewed the radiological images as listed and agreed with the findings in the report. No results found.    ASSESSMENT & PLAN:   1) Diffuse large B-cell lymphoma stage IV AE with involvement of mesenteric, inguinal lymph nodes and C5 vertebral body with spinal cord impingement  Patient is status post six cycles of R-CHOP and R-mini-CHOP, completed in 2016, as noted above.  2) History of capillary leak syndrome with ARDS requiring intubation likely due to G-CSF.  3) Newly diagnosed Follicular Lymphoma, Grade 1-2   11/02/18 CT A/P which revealed Negative for inguinal or bowel hernia but positive for bulky right inguinal and external iliac lymphadenopathy which is new since a 2017 PET-CT and highly suspicious for recurrent Lymphoma. This would be amenable to Ultrasound-guided needle biopsy. 2. No abdominal  or contralateral left hemi pelvic lymphadenopathy. No other acute findings identified in the abdomen or pelvis. 3. Chronic pneumobilia.  Aortic Atherosclerosis.  11/12/18 PET/CT which revealed  Interval development of multistation lymphadenopathy in the abdomen and pelvis as detailed above- [place] 5. 2. Presence of pneumobilia could reflect sphincter of Oddi dysfunction versus recent procedure. Correlate with clinical history and upcoming comprehensive metabolic panel. 3. Ancillary CT findings   11/27/18 US guided LN Biopsy pathology report is as noted above. " B-cell lymphoma with a germinal center phenotype (GC-type), with focal follicular architecture and a low proliferation index. Given the nature of the sample (i.e. core biopsy) a final architectural subclassification is not possible. If clinically feasible, an excisional biopsy is recommended."  12/11/18 Right Inguinal LN biopsy revealed Grade 1-2 Follicular Lymphoma  5/0/53 CT A/P revealed Today's study demonstrates clear progression of disease with increased number and size of numerous enlarged lymph nodes  throughout the lower thorax, abdomen and pelvis, as detailed above. 2. In addition, there are several small pulmonary nodules in the lung bases. The largest of these are clustered in the posterior aspect of the right lower lobe in an apparent area of scarring which is very similar to prior PET-CT 03/12/2016, favored to be benign. However, the smaller nodules which measure up to 6 mm in size are nonspecific and warrant attention on follow-up studies. 3. Aortic atherosclerosis, in addition to least 2 vessel coronary artery disease. Assessment for potential risk factor modification, dietary therapy or pharmacologic therapy may be warranted, if clinically indicated. 4. Additional incidental findings, as above. 01/18/19 BM Bx revealed normocellular bone marrow with trilineage hematopoiesis  Began C1 of 19m Revlimid and Rituxan on  02/05/19  PLAN: -Discussed pt labwork today, 03/05/19; blood counts are normal -The pt tolerated 4 weekly cycles of Rituxan well, and will now move to monthly Rituxan infusions for planned 6 months. No prohibitive toxicities. -The pt has no prohibitive toxicities from continuing 247mRevlimid at this time. -Suspect pt has a cyst on his bottom, recommend warm soaks -Will repeat scan in 3 months, after completing 3-4 cycles -Recommend crowd avoidance and infection prevention strategies -Planning for Revlimid 10-12 cycles -Recommend Zofran for nausea, Compazine as needed -Recommend Claritin or Zyrtec for mild itching, in addition to lotion application and avoid long, hot showers -Continue [id number] for VTE prophylaxis while on Revlimid -Stage II indicated with 01/26/19 CT -Port flush every 6 weeks -Recommend OTC Clotrimazole for inguinal yeast infection after antibiotic use -Recommended that the pt continue to eat well, drink at least 48-64 oz of water each day, and walk 20-30 minutes each day. -Will see the pt back in 4 weeks   3) History of right lower extremity distal DVT. Resolved. S/p 6 months of treatment in 02/2016 -Returning to 72m79mrelto while taking Revlimid  4) Mild chronic thrombocytopenia- resolved 03/05/19 PLT at 175k   RTC with Dr KaleIrene Limbod MD appointment on 4/9) with labs and next dose of Rituxan   The total time spent in the appt was 25 minutes and more than 50% was on counseling and direct patient cares.   GautSullivan LoneMS Hematology/Oncology Physician [facility]ice):       [phone] cell):  [phone]):           [phone] SchuBaldwin [place] acting as a scribe for Dr. GautSullivan Lone.I have reviewed the above documentation for accuracy and completeness, and I agree with the above. .GauBrunetta Genera

## 2019-03-05 ENCOUNTER — Inpatient Hospital Stay: Payer: No Typology Code available for payment source

## 2019-03-05 ENCOUNTER — Inpatient Hospital Stay (HOSPITAL_BASED_OUTPATIENT_CLINIC_OR_DEPARTMENT_OTHER): Payer: No Typology Code available for payment source | Admitting: Hematology

## 2019-03-05 ENCOUNTER — Other Ambulatory Visit: Payer: Self-pay

## 2019-03-05 ENCOUNTER — Telehealth: Payer: Self-pay | Admitting: Hematology

## 2019-03-05 VITALS — BP 131/72 | HR 56 | Temp 98.0°F | Resp 17 | Ht 73.0 in | Wt 224.4 lb

## 2019-03-05 VITALS — BP 118/48 | HR 54 | Temp 98.0°F | Resp 17

## 2019-03-05 DIAGNOSIS — C8298 Follicular lymphoma, unspecified, lymph nodes of multiple sites: Secondary | ICD-10-CM | POA: Diagnosis not present

## 2019-03-05 DIAGNOSIS — Z86718 Personal history of other venous thrombosis and embolism: Secondary | ICD-10-CM

## 2019-03-05 DIAGNOSIS — Z7901 Long term (current) use of anticoagulants: Secondary | ICD-10-CM

## 2019-03-05 DIAGNOSIS — Z808 Family history of malignant neoplasm of other organs or systems: Secondary | ICD-10-CM

## 2019-03-05 DIAGNOSIS — D696 Thrombocytopenia, unspecified: Secondary | ICD-10-CM

## 2019-03-05 DIAGNOSIS — Z5112 Encounter for antineoplastic immunotherapy: Secondary | ICD-10-CM | POA: Diagnosis not present

## 2019-03-05 DIAGNOSIS — C8330 Diffuse large B-cell lymphoma, unspecified site: Secondary | ICD-10-CM

## 2019-03-05 DIAGNOSIS — Z95828 Presence of other vascular implants and grafts: Secondary | ICD-10-CM

## 2019-03-05 LAB — CMP (CANCER CENTER ONLY)
ALT: 31 U/L (ref 0–44)
AST: 25 U/L (ref 15–41)
Albumin: 3.6 g/dL (ref 3.5–5.0)
Alkaline Phosphatase: 74 U/L (ref 38–126)
Anion gap: 8 (ref 5–15)
BUN: 20 mg/dL (ref 8–23)
CO2: 25 mmol/L (ref 22–32)
Calcium: 8.6 mg/dL — ABNORMAL LOW (ref 8.9–10.3)
Chloride: 109 mmol/L (ref 98–111)
Creatinine: 1.01 mg/dL (ref 0.61–1.24)
GFR, Est AFR Am: >60 mL/min (ref >60)
GFR, Estimated: >60 mL/min (ref >60)
Glucose, Bld: 77 mg/dL (ref 70–99)
Potassium: 4.2 mmol/L (ref 3.5–5.1)
Sodium: 142 mmol/L (ref 135–145)
Total Bilirubin: 0.6 mg/dL (ref 0.3–1.2)
Total Protein: 6.2 g/dL — ABNORMAL LOW (ref 6.5–8.1)

## 2019-03-05 LAB — CBC WITH DIFFERENTIAL/PLATELET
Abs Immature Granulocytes: 0 10*3/uL (ref 0.00–0.07)
Basophils Absolute: 0.1 10*3/uL (ref 0.0–0.1)
Basophils Relative: 3 %
Eosinophils Absolute: 0.1 10*3/uL (ref 0.0–0.5)
Eosinophils Relative: 3 %
HCT: 40.7 % (ref 39.0–52.0)
Hemoglobin: 13.3 g/dL (ref 13.0–17.0)
Immature Granulocytes: 0 %
Lymphocytes Relative: 29 %
Lymphs Abs: 1.1 10*3/uL (ref 0.7–4.0)
MCH: 29.6 pg (ref 26.0–34.0)
MCHC: 32.7 g/dL (ref 30.0–36.0)
MCV: 90.4 fL (ref 80.0–100.0)
MONOS PCT: 16 %
Monocytes Absolute: 0.6 10*3/uL (ref 0.1–1.0)
NEUTROS PCT: 49 %
Neutro Abs: 1.9 10*3/uL (ref 1.7–7.7)
Platelets: 175 10*3/uL (ref 150–400)
RBC: 4.5 MIL/uL (ref 4.22–5.81)
RDW: 13.5 % (ref 11.5–15.5)
WBC: 3.9 10*3/uL — ABNORMAL LOW (ref 4.0–10.5)
nRBC: 0 % (ref 0.0–0.2)

## 2019-03-05 MED ORDER — SODIUM CHLORIDE 0.9 % IV SOLN
375.0000 mg/m2 | Freq: Once | INTRAVENOUS | Status: AC
  Administered 2019-03-05: 900 mg via INTRAVENOUS
  Filled 2019-03-05: qty 40

## 2019-03-05 MED ORDER — METHYLPREDNISOLONE SODIUM SUCC 125 MG IJ SOLR
INTRAMUSCULAR | Status: AC
  Filled 2019-03-05: qty 2

## 2019-03-05 MED ORDER — SODIUM CHLORIDE 0.9% FLUSH
10.0000 mL | Freq: Once | INTRAVENOUS | Status: AC
  Administered 2019-03-05: 10 mL
  Filled 2019-03-05: qty 10

## 2019-03-05 MED ORDER — ACETAMINOPHEN 325 MG PO TABS
650.0000 mg | ORAL_TABLET | Freq: Once | ORAL | Status: AC
  Administered 2019-03-05: 650 mg via ORAL

## 2019-03-05 MED ORDER — ACETAMINOPHEN 325 MG PO TABS
ORAL_TABLET | ORAL | Status: AC
  Filled 2019-03-05: qty 2

## 2019-03-05 MED ORDER — SODIUM CHLORIDE 0.9% FLUSH
10.0000 mL | INTRAVENOUS | Status: DC | PRN
  Administered 2019-03-05: 10 mL
  Filled 2019-03-05: qty 10

## 2019-03-05 MED ORDER — SODIUM CHLORIDE 0.9 % IV SOLN
Freq: Once | INTRAVENOUS | Status: AC
  Administered 2019-03-05: 11:00:00 via INTRAVENOUS
  Filled 2019-03-05: qty 250

## 2019-03-05 MED ORDER — DIPHENHYDRAMINE HCL 25 MG PO CAPS
50.0000 mg | ORAL_CAPSULE | Freq: Once | ORAL | Status: AC
  Administered 2019-03-05: 50 mg via ORAL

## 2019-03-05 MED ORDER — METHYLPREDNISOLONE SODIUM SUCC 125 MG IJ SOLR
125.0000 mg | Freq: Every day | INTRAMUSCULAR | Status: DC
  Administered 2019-03-05: 125 mg via INTRAVENOUS

## 2019-03-05 MED ORDER — HEPARIN SOD (PORK) LOCK FLUSH 100 UNIT/ML IV SOLN
500.0000 [IU] | Freq: Once | INTRAVENOUS | Status: AC | PRN
  Administered 2019-03-05: 500 [IU]
  Filled 2019-03-05: qty 5

## 2019-03-05 MED ORDER — DIPHENHYDRAMINE HCL 25 MG PO CAPS
ORAL_CAPSULE | ORAL | Status: AC
  Filled 2019-03-05: qty 2

## 2019-03-05 NOTE — Telephone Encounter (Signed)
Scheduled appt per 3/13 los.  Added MD visit.

## 2019-03-05 NOTE — Patient Instructions (Signed)
[facility] Discharge Instructions for Patients Receiving Chemotherapy  Today you received the following chemotherapy agents Rituximab (RITUXAN).  To help prevent nausea and vomiting after your treatment, we encourage you to take your nausea medication as prescribed.   If you develop nausea and vomiting that is not controlled by your nausea medication, call the clinic.   BELOW ARE SYMPTOMS THAT SHOULD BE REPORTED IMMEDIATELY:  *FEVER GREATER THAN 100.5 F  *CHILLS WITH OR WITHOUT FEVER  NAUSEA AND VOMITING THAT IS NOT CONTROLLED WITH YOUR NAUSEA MEDICATION  *UNUSUAL SHORTNESS OF BREATH  *UNUSUAL BRUISING OR BLEEDING  TENDERNESS IN MOUTH AND THROAT WITH OR WITHOUT PRESENCE OF ULCERS  *URINARY PROBLEMS  *BOWEL PROBLEMS  UNUSUAL RASH Items with * indicate a potential emergency and should be followed up as soon as possible.  Feel free to call the clinic should you have any questions or concerns. The clinic phone number is [phone].  Please show the CHEMO ALERT CARD at check-in to the Emergency Department and triage nurse.   

## 2019-03-16 ENCOUNTER — Other Ambulatory Visit: Payer: Self-pay | Admitting: *Deleted

## 2019-03-16 MED ORDER — LENALIDOMIDE 20 MG PO CAPS: 20.0000 mg | ORAL_CAPSULE | Freq: Every day | ORAL | 0 refills | Status: DC

## 2019-03-16 MED ORDER — LENALIDOMIDE 20 MG PO CAPS: 20.0000 mg | ORAL_CAPSULE | Freq: Every day | ORAL | 0 refills | Status: AC

## 2019-03-18 ENCOUNTER — Telehealth: Payer: Self-pay | Admitting: *Deleted

## 2019-03-18 NOTE — Telephone Encounter (Signed)
Faxed Revlimid prescription to [facility] in [place], [place]. [place] ILN#[id number]. [employer] Josem Kaufmann #[id number] (03/16/2019) Fax confirmation received

## 2019-03-25 ENCOUNTER — Other Ambulatory Visit: Payer: Self-pay | Admitting: *Deleted

## 2019-03-25 DIAGNOSIS — L570 Actinic keratosis: Secondary | ICD-10-CM | POA: Diagnosis not present

## 2019-03-25 DIAGNOSIS — D485 Neoplasm of uncertain behavior of skin: Secondary | ICD-10-CM | POA: Diagnosis not present

## 2019-03-25 DIAGNOSIS — L110 Acquired keratosis follicularis: Secondary | ICD-10-CM | POA: Diagnosis not present

## 2019-03-25 DIAGNOSIS — D2322 Other benign neoplasm of skin of left ear and external auricular canal: Secondary | ICD-10-CM | POA: Diagnosis not present

## 2019-03-25 DIAGNOSIS — Z85828 Personal history of other malignant neoplasm of skin: Secondary | ICD-10-CM | POA: Diagnosis not present

## 2019-03-31 NOTE — Progress Notes (Signed)
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 Cytogenetics and Fish showed BCL 2 positivity but negative for BCL 6 and cMYC CD10 positivity suggestive of possibly GCB subtype.  Patient received first cycle of R CHOP on 08/08/2015 and intrathecal methotrexate with hydrocortisone on 08/09/2015. Patient subsequently was admitted with ARDS likely due to neulasta -treated with high dose steroids and empirically with broad spectrum antibiotics.  2nd cycle of chemotherapy delayed to allow for rehabilitation (was due on 08/31/2015).  Received R-mini-CHOP for cycle 2 and tolerated it without significant cytopenias. Was admitted briefly for a mild lower extremity cellulitis and DVT. Cellulitis now resolved. Patient completing his oral antibiotics.  PET/CT scan on 09/29/2015 with no evidence of disease progression. Good response in his mesenteric lymph nodes. Inguinal  lymph nodes on the right side. Decreased uptake in his C5 level.  No new lesions noted.   Cycle 3 (10/02/2015)  of R- CHOP ( we increased the doxorubicin dose back to 50 mg/m and keep the cyclophosphamide dose reduction at 400 mg/m]  Cycle 4 (10/23/2015) of R-CHOP ( we increased the doxorubicin dose back to 50 mg/m and kept the cyclophosphamide dose reduction at 400 mg/m].  PET/CT 11/10/2015: Shows improvement in most lesions but is noted to have a new FDG avid lymph node in the mesentery of the small bowel.  Cycle 5 on 11/13/2015 R CHOP - received full dose.  Cycle 6 on 12/11/2015 R-CHOP full dose.  Admitted with neutropenic fevers and treatment for possible early pneumonia.  This resolved and he was able to discharge home  01/09/2016 through 01/22/2016: The patient was treated to the C4-C6 levels of the spine to a dose of 30 gray in 10 fractions using a 2 field technique. The patient was also treated to an abdominal lymph node region to a dose of 30 gray in 10 fractions using a 3 field technique. This treatment consisted of a 3-D conformal technique with daily image guidance utilized.   Repeat PET CT scan in March 12 2016 -showed some enlarged mesenteric lymph nodes which were FDG avid.  CT guided FNA of mesenteric lymph nodes at [facility] on 03/29/2016 - showed scant cellularity. No overt evidence of lymphoma.  Rpt PET/CT 05/15/2016 at [facility]- show similar appearing enlarged mesenteric lymph nodes which are FDG avid. No change in size and no other evidence of disease progression.  PET/CT 07/24/2016 - and [facility] shows no evidence of disease progression  PET/CT 01/22/2017:  Result Impression   1.Stable low-level metabolic activity in small lymph nodes in the small bowel mesentery. No new FDG avid disease. 2.Persistent hypermetabolic soft tissue thickening overlying the coccyx which could relate to a decubitus ulcer and should be  amenable to direct inspection. 3.Stable pulmonary nodules which do not appear hypermetabolic, however, several are below the size resolution of PET in size. Continued attention on follow-up is suggested. 4.Previously seen hypermetabolic skin thickening near the right ear has resolved. 5.Ancillary CT findings as above.    INTERVAL HISTORY:   Ethan Stewart is here for management and evaluation of [name], grade 1-2. The patient's last visit with us was on 03/05/19. The pt reports that he is doing well overall.   The pt reports that he has had good energy levels in the interim. He notes that the cyst on his bottom is improved and nearly resolved. He has not been going out into public spaces and denies concerns for infections. The pt denies fevers, chills, night sweats, abdominal pains, and unexpected weight loss.   The pt is now in the last day of his off-week of Revlimid, anticipating resuming 3 weeks on Revlimid, tomorrow.  The pt notes that he takes Senna a couple times a week, and endorses some constipation.  The pt notes that he has continued to walk at least 3 miles every day.  Lab results today (04/01/19) of CBC w/diff is as follows: all values are WNL except for WBC at 2.5k, PLT at 133k, ANC at 700. 04/01/19 LDH and CMP are pending  On review of systems, pt reports good energy levels, eating well, some constipation, staying active, strong appetite, and denies fevers, chills, night sweats, skin rashes, abdominal pains, concerns for infections, mouth sores, noticing any new lumps or bumps, lower abdominal pains, and any other symptoms.  REVIEW OF SYSTEMS:    A 10+ POINT REVIEW OF SYSTEMS WAS OBTAINED including neurology, dermatology, psychiatry, cardiac, respiratory, lymph, extremities, GI, GU, Musculoskeletal, constitutional, breasts, reproductive, HEENT.  All pertinent positives are noted in the HPI.  All others are negative.   ALLERGIES:  is allergic  to neulasta [pegfilgrastim].  MEDICATIONS:  Current Outpatient Medications  Medication Sig Dispense Refill  . diazepam (VALIUM) 2 MG tablet Take 2 mg by mouth daily as needed for anxiety.    . HYDROcodone-acetaminophen (NORCO) 5-325 MG tablet Take 1-2 tablets by mouth every 6 (six) hours as needed for moderate pain or severe pain. (Patient not taking: Reported on 12/17/2018) 20 tablet 0  . lenalidomide (REVLIMID) 20 MG capsule Take 1 capsule (20 mg total) by mouth daily for 21 days. 21 days on 7 days off. Swallow whole.  Do not Crush. 21 capsule 0  . levothyroxine (SYNTHROID, LEVOTHROID) 88 MCG tablet Take 88 mcg by mouth daily before breakfast.   2  . lidocaine-prilocaine (EMLA) cream Apply 1 application topically as needed (for port access).    . Multiple Vitamin (MULTIVITAMIN WITH MINERALS) TABS tablet Take 1 tablet by mouth daily.     . ondansetron (ZOFRAN) 8 MG tablet Take 1 tablet (8 mg total) by mouth every 8 (eight) hours as needed for nausea or vomiting. 30 tablet 1  . prochlorperazine (COMPAZINE) 10 MG tablet Take 1 tablet (10 mg total) by mouth every 8 (eight) hours as needed for nausea or vomiting. 30 tablet 0  . rivaroxaban (XARELTO) 10 MG TABS tablet Take 1 tablet (10 mg total) by mouth daily. 30 tablet 5  . senna (SENOKOT) 8.6   MG TABS tablet Take 2 tablets by mouth at bedtime.    . simvastatin (ZOCOR) 20 MG tablet Take 20 mg by mouth daily.    . temazepam (RESTORIL) 15 MG capsule Take 15 mg by mouth at bedtime as needed for sleep.      No current facility-administered medications for this visit.     PHYSICAL EXAMINATION: ECOG PERFORMANCE STATUS: 1 - Symptomatic but completely ambulatory  Vitals:   04/01/19 0958  BP: 138/67  Pulse: (!) 50  Resp: 17  Temp: 98.2 F (36.8 C)  SpO2: 100%   Filed Weights   04/01/19 0958  Weight: 222 lb 11.2 oz (101 kg)    GENERAL:alert, in no acute distress and comfortable SKIN: no acute rashes, no significant lesions EYES:  conjunctiva are pink and non-injected, sclera anicteric OROPHARYNX: MMM, no exudates, no oropharyngeal erythema or ulceration NECK: supple, no JVD LYMPH: Minimally palpable right inguinal lymph node about 1.5cm, no palpable lymphadenopathy in the cervical or axillary regions LUNGS: clear to auscultation b/l with normal respiratory effort HEART: regular rate & rhythm ABDOMEN:  normoactive bowel sounds , non tender, not distended. No palpable hepatosplenomegaly.  Extremity: no pedal edema PSYCH: alert & oriented x 3 with fluent speech NEURO: no focal motor/sensory deficits   LABORATORY DATA:   CBC Latest Ref Rng & Units 04/01/2019 03/05/2019 02/26/2019  WBC 4.0 - 10.5 K/uL 2.5(L) 3.9(L) 3.5(L)  Hemoglobin 13.0 - 17.0 g/dL 13.3 13.3 13.4  Hematocrit 39.0 - 52.0 % 39.9 40.7 40.6  Platelets 150 - 400 K/uL 133(L) 175 161     CMP Latest Ref Rng & Units 03/05/2019 02/26/2019 02/19/2019  Glucose 70 - 99 mg/dL 77 98 91  BUN 8 - 23 mg/dL _0 Creatinine 0.61 - 1.24 mg/dL 1.01 1.11 1.14  Sodium 135 - 145 mmol/L 142 139 141  Potassium 3.5 - 5.1 mmol/L 4.2 3.8 4.1  Chloride 98 - 111 mmol/L 109 106 105  CO2 22 - 32 mmol/L _1 Calcium 8.9 - 10.3 mg/dL 8.6(L) 8.7(L) 8.9  Total Protein 6.5 - 8.1 g/dL 6.2(L) 6.1(L) 6.1(L)  Total Bilirubin 0.3 - 1.2 mg/dL 0.6 0.6 0.6  Alkaline Phos 38 - 126 U/L 74 65 74  AST 15 - 41 U/L _2 ALT 0 - 44 U/L _3 . Lab Results  Component Value Date   LDH 206 (H) 02/11/2019    Fine Needle Aspiration, Lymph NodeResulted: 12/04/2018 1:14 PM [facility] Result Narrative  ACCESSION NUMBER: [id number] RECEIVED: 11/27/2018 ORDERING PHYSICIAN: RAKHEE Megan Mans , MD PATIENT NAME: Ethan Stewart CYTOLOGY REPORT * Amended * Final Cytologic Interpretation AMENDED REPORT  AMENDED 12/04/2018 TO ADD DIAGNOSIS   A. Right inguinal lymph node, Fine Needle Aspiration II (smears and cell block): B-cell lymphoma with  germinal center phenotype (see comment)  Specimen Adequacy: Satisfactory for evaluation.  B. Cytology core biopsy: B-cell lymphoma with germinal center phenotype (see comment)  Specimen Adequacy: Satisfactory for evaluation.    COMMENT:Sections of the core biopsy demonstrate an atypical vaguely nodular lymphoid infiltrate composed of medium sized lymphoid cells with mature chromatin, irregular nuclear membranes and scant cytoplasm. Focally some areas show larger cells. Few mitotic figures are present. Appropriately controlled immunohistochemical stains are performed. Sheets of B-cells are highlighted by CD20. These B-cells co-express BCL2 and BCL6 (focal, nodular distribution). They are negative for CD30 and MYC. CD21 demonstrates few residual disrupted follicular dendritic meshworks. The proliferation index is low (10-20%),  as demonstrated by Ki-67. Scattered small T-cells are highlighted by CD3. Concurrent flow cytometry identified a kappa light chain restricted B-cell population, co-expressing CD10.  Overall, the findings are consistent with involvement by a B-cell lymphoma with a germinal center phenotype (GC-type), with focal follicular architecture and a low proliferation index. Given the nature of the sample (i.e. core biopsy) a final architectural subclassification is not possible. If clinically feasible, an excisional biopsy is recommended.  Correlation with clinical and cytogenetic/molecular data is recommended.    12/11/18 Inguinal LN Biopsy  Interpretation Tissue-Flow Cytometry - MONOCLONAL B-CELL POPULATION EXPRESSING CD10 COMPRISES 92% OF ALL LYMPHOCYTES - SEE COMMENT Microscopic Gated population: Flow cytometric immunophenotyping is performed using antibiodies to the antigens listed in the table below. Electronic gates are placed around a cell cluster displaying light scatter properties corresponding to lymphocytes. - Abnormal Cells in  gated population: 92 % - Phenotype of Abnormal Cells: CD10, CD19, CD20, CD21, CD22, HLA-Dr, Kappa Diagnosis Lymph node for lymphoma, Deep Right Inguinal - FOLLICULAR LYMPHOMA, GRADE 1-2 - SEE COMMENT Microscopic Comment The nodal architecture is effaced by a proliferation back to back follicles composed of medium to large lymphocytes with irregular, cleaved nuclei and scattered centroblasts. There are no diffuse areas of growth present within the examined nodal material. Immunohistochemistry performed on the tissue highlights a nodular B-cell proliferation with expression of CD20, CD10, bcl-6, and Bcl-2. CD21 highlights expanded follicular dendritic meshworks. The Ki-67 shows a slightly increased proliferation rate of 30-40%. The atypical lymphocytes are negative for CD5. CD3 immunohistochemistry highlights the background T-cells. Flow cytometry was performed. A monoclonal B-cell populaton expressing CD10 comprises 92% of all lymphocytes (See [id number]). Overall, the findings are consistent with a diagnosis of low grade follicular lymphoma, Grade 1-2. Of note, the increased ki-67 may portend a more aggressive course despite the low histologic grade.   01/18/19 BM Biopsy:     RADIOGRAPHIC STUDIES: I have personally reviewed the radiological images as listed and agreed with the findings in the report. No results found.    ASSESSMENT & PLAN:   1) Diffuse large B-cell lymphoma stage IV AE with involvement of mesenteric, inguinal lymph nodes and C5 vertebral body with spinal cord impingement  Patient is status post six cycles of R-CHOP and R-mini-CHOP, completed in 2016, as noted above.  2) History of capillary leak syndrome with ARDS requiring intubation likely due to G-CSF.  3) Newly diagnosed Follicular Lymphoma, Grade 1-2   11/02/18 CT A/P which revealed Negative for inguinal or bowel hernia but positive for bulky right inguinal and external iliac lymphadenopathy which is new  since a 2017 PET-CT and highly suspicious for recurrent Lymphoma. This would be amenable to Ultrasound-guided needle biopsy. 2. No abdominal or contralateral left hemi pelvic lymphadenopathy. No other acute findings identified in the abdomen or pelvis. 3. Chronic pneumobilia.  Aortic Atherosclerosis.  11/12/18 PET/CT which revealed  Interval development of multistation lymphadenopathy in the abdomen and pelvis as detailed above- [place] 5. 2. Presence of pneumobilia could reflect sphincter of Oddi dysfunction versus recent procedure. Correlate with clinical history and upcoming comprehensive metabolic panel. 3. Ancillary CT findings   11/27/18 US guided LN Biopsy pathology report is as noted above. " B-cell lymphoma with a germinal center phenotype (GC-type), with focal follicular architecture and a low proliferation index. Given the nature of the sample (i.e. core biopsy) a final architectural subclassification is not possible. If clinically feasible, an excisional biopsy is recommended."  12/11/18 Right Inguinal LN biopsy revealed Grade 1-2 Follicular Lymphoma  01/26/19 CT A/P revealed Today's study demonstrates clear progression of disease with increased number and size of numerous enlarged lymph nodes throughout the lower thorax, abdomen and pelvis, as detailed above. 2. In addition, there are several small pulmonary nodules in the lung bases. The largest of these are clustered in the posterior aspect of the right lower lobe in an apparent area of scarring which is very similar to prior PET-CT 03/12/2016, favored to be benign. However, the smaller nodules which measure up to 6 mm in size are nonspecific and warrant attention on follow-up studies. 3. Aortic atherosclerosis, in addition to least 2 vessel coronary artery disease. Assessment for potential risk factor modification, dietary therapy or pharmacologic therapy may be warranted, if clinically indicated. 4. Additional incidental findings, as  above. 01/18/19 BM Bx revealed normocellular bone marrow with trilineage hematopoiesis  Began C1 of 20mg Revlimid and Rituxan on 02/05/19  PLAN: -Discussed pt labwork today, 04/01/19; mild neutropenia with ANC at 700 likely from Revlimid. LDH and CMP are pending. -Will check labs again in 10 days to ensure neutropenia improves before resuming Revlimid, at which time we may dose reduce from 20mg or continue. -The pt has no prohibitive toxicities from continuing monthly Rituxan at this time. This is his sixth infusion of Rituxan during this treatment. -Advised taking Miralax once a day, staying well hydrated, and staying active -Will repeat scans after pt has completed total of 6 months of treatment -The pt tolerated 4 weekly cycles of Rituxan well, and is now pursuing monthly Rituxan infusions for planned 6 months. -Recommend crowd avoidance and infection prevention strategies -Planning for Revlimid 10-12 cycles -Recommend Zofran for nausea, Compazine as needed -Recommend Claritin or Zyrtec for mild itching, in addition to lotion application and avoid long, hot showers -Continue 10mg Xarelto for VTE prophylaxis while on Revlimid -Stage II indicated with 01/26/19 CT -Port flush every 6 weeks -Recommended that the pt continue to eat well, drink at least 48-64 oz of water each day, and walk 20-30 minutes each day. -Will check labs again in 10 days -Will see the pt back in 4 weeks   3) History of right lower extremity distal DVT. Resolved. S/p 6 months of treatment in 02/2016 -Returning to 10mg Xarelto while taking Revlimid  4) Mild chronic thrombocytopenia- resolved 03/05/19 PLT at 175k   plz add labs in 10 days RTC as per scheduled appointments on 5/7 for next Rituxan/labs and MD visit   The total time spent in the appt was 25 minutes and more than 50% was on counseling and direct patient cares.   Gautam Kale MD MS Hematology/Oncology Physician [facility]  (Office):        [phone] (Work cell):  [phone] (Fax):           [phone]  I, Schuyler Bain, am acting as a scribe for Dr. Gautam Kale.   .I have reviewed the above documentation for accuracy and completeness, and I agree with the above. .Gautam Kishore Kale MD  

## 2019-04-01 ENCOUNTER — Other Ambulatory Visit: Payer: Self-pay

## 2019-04-01 ENCOUNTER — Inpatient Hospital Stay: Payer: No Typology Code available for payment source

## 2019-04-01 ENCOUNTER — Ambulatory Visit: Payer: Self-pay

## 2019-04-01 ENCOUNTER — Inpatient Hospital Stay (HOSPITAL_BASED_OUTPATIENT_CLINIC_OR_DEPARTMENT_OTHER): Payer: No Typology Code available for payment source | Admitting: Hematology

## 2019-04-01 ENCOUNTER — Inpatient Hospital Stay: Payer: No Typology Code available for payment source | Attending: Hematology

## 2019-04-01 VITALS — BP 138/67 | HR 50 | Temp 98.2°F | Resp 17 | Ht 73.0 in | Wt 222.7 lb

## 2019-04-01 VITALS — BP 138/88 | HR 60 | Temp 98.0°F | Resp 18

## 2019-04-01 DIAGNOSIS — C8330 Diffuse large B-cell lymphoma, unspecified site: Secondary | ICD-10-CM

## 2019-04-01 DIAGNOSIS — Z5112 Encounter for antineoplastic immunotherapy: Secondary | ICD-10-CM | POA: Insufficient documentation

## 2019-04-01 DIAGNOSIS — Z7901 Long term (current) use of anticoagulants: Secondary | ICD-10-CM

## 2019-04-01 DIAGNOSIS — C8298 Follicular lymphoma, unspecified, lymph nodes of multiple sites: Secondary | ICD-10-CM | POA: Insufficient documentation

## 2019-04-01 DIAGNOSIS — D696 Thrombocytopenia, unspecified: Secondary | ICD-10-CM | POA: Diagnosis not present

## 2019-04-01 DIAGNOSIS — Z86718 Personal history of other venous thrombosis and embolism: Secondary | ICD-10-CM

## 2019-04-01 DIAGNOSIS — Z95828 Presence of other vascular implants and grafts: Secondary | ICD-10-CM

## 2019-04-01 DIAGNOSIS — D702 Other drug-induced agranulocytosis: Secondary | ICD-10-CM

## 2019-04-01 DIAGNOSIS — Z79899 Other long term (current) drug therapy: Secondary | ICD-10-CM

## 2019-04-01 LAB — CMP (CANCER CENTER ONLY)
ALT: 44 U/L (ref 0–44)
AST: 39 U/L (ref 15–41)
Albumin: 3.7 g/dL (ref 3.5–5.0)
Alkaline Phosphatase: 74 U/L (ref 38–126)
Anion gap: 8 (ref 5–15)
BUN: 17 mg/dL (ref 8–23)
CO2: 24 mmol/L (ref 22–32)
Calcium: 8.6 mg/dL — ABNORMAL LOW (ref 8.9–10.3)
Chloride: 109 mmol/L (ref 98–111)
Creatinine: 0.94 mg/dL (ref 0.61–1.24)
GFR, Est AFR Am: >60 mL/min (ref >60)
GFR, Estimated: >60 mL/min (ref >60)
Glucose, Bld: 85 mg/dL (ref 70–99)
Potassium: 4.3 mmol/L (ref 3.5–5.1)
Sodium: 141 mmol/L (ref 135–145)
Total Bilirubin: 0.5 mg/dL (ref 0.3–1.2)
Total Protein: 6.1 g/dL — ABNORMAL LOW (ref 6.5–8.1)

## 2019-04-01 LAB — CBC WITH DIFFERENTIAL/PLATELET
Abs Immature Granulocytes: 0.02 10*3/uL (ref 0.00–0.07)
Basophils Absolute: 0.1 10*3/uL (ref 0.0–0.1)
Basophils Relative: 4 %
Eosinophils Absolute: 0.1 10*3/uL (ref 0.0–0.5)
Eosinophils Relative: 5 %
HCT: 39.9 % (ref 39.0–52.0)
Hemoglobin: 13.3 g/dL (ref 13.0–17.0)
Immature Granulocytes: 1 %
Lymphocytes Relative: 41 %
Lymphs Abs: 1 10*3/uL (ref 0.7–4.0)
MCH: 29.8 pg (ref 26.0–34.0)
MCHC: 33.3 g/dL (ref 30.0–36.0)
MCV: 89.3 fL (ref 80.0–100.0)
Monocytes Absolute: 0.6 10*3/uL (ref 0.1–1.0)
Monocytes Relative: 22 %
Neutro Abs: 0.7 10*3/uL — ABNORMAL LOW (ref 1.7–7.7)
Neutrophils Relative %: 27 %
Platelets: 133 10*3/uL — ABNORMAL LOW (ref 150–400)
RBC: 4.47 MIL/uL (ref 4.22–5.81)
RDW: 13.5 % (ref 11.5–15.5)
WBC: 2.5 10*3/uL — ABNORMAL LOW (ref 4.0–10.5)
nRBC: 0 % (ref 0.0–0.2)

## 2019-04-01 LAB — LACTATE DEHYDROGENASE: LDH: 262 U/L — ABNORMAL HIGH (ref 98–192)

## 2019-04-01 MED ORDER — DIPHENHYDRAMINE HCL 25 MG PO CAPS
50.0000 mg | ORAL_CAPSULE | Freq: Once | ORAL | Status: AC
  Administered 2019-04-01: 50 mg via ORAL

## 2019-04-01 MED ORDER — METHYLPREDNISOLONE SODIUM SUCC 125 MG IJ SOLR
125.0000 mg | Freq: Every day | INTRAMUSCULAR | Status: DC
  Administered 2019-04-01: 11:00:00 125 mg via INTRAVENOUS

## 2019-04-01 MED ORDER — SODIUM CHLORIDE 0.9 % IV SOLN
Freq: Once | INTRAVENOUS | Status: AC
  Administered 2019-04-01: 11:00:00 via INTRAVENOUS
  Filled 2019-04-01: qty 250

## 2019-04-01 MED ORDER — ACETAMINOPHEN 325 MG PO TABS
ORAL_TABLET | ORAL | Status: AC
  Filled 2019-04-01: qty 2

## 2019-04-01 MED ORDER — DIPHENHYDRAMINE HCL 25 MG PO CAPS
ORAL_CAPSULE | ORAL | Status: AC
  Filled 2019-04-01: qty 2

## 2019-04-01 MED ORDER — ACETAMINOPHEN 325 MG PO TABS
650.0000 mg | ORAL_TABLET | Freq: Once | ORAL | Status: AC
  Administered 2019-04-01: 11:00:00 650 mg via ORAL

## 2019-04-01 MED ORDER — METHYLPREDNISOLONE SODIUM SUCC 125 MG IJ SOLR
INTRAMUSCULAR | Status: AC
  Filled 2019-04-01: qty 2

## 2019-04-01 MED ORDER — SODIUM CHLORIDE 0.9% FLUSH
10.0000 mL | Freq: Once | INTRAVENOUS | Status: AC
  Administered 2019-04-01: 10 mL
  Filled 2019-04-01: qty 10

## 2019-04-01 MED ORDER — SODIUM CHLORIDE 0.9% FLUSH
10.0000 mL | INTRAVENOUS | Status: DC | PRN
  Administered 2019-04-01: 14:00:00 10 mL
  Filled 2019-04-01: qty 10

## 2019-04-01 MED ORDER — HEPARIN SOD (PORK) LOCK FLUSH 100 UNIT/ML IV SOLN
500.0000 [IU] | Freq: Once | INTRAVENOUS | Status: AC | PRN
  Administered 2019-04-01: 500 [IU]
  Filled 2019-04-01: qty 5

## 2019-04-01 MED ORDER — SODIUM CHLORIDE 0.9 % IV SOLN
375.0000 mg/m2 | Freq: Once | INTRAVENOUS | Status: AC
  Administered 2019-04-01: 13:00:00 900 mg via INTRAVENOUS
  Filled 2019-04-01: qty 40

## 2019-04-01 NOTE — Progress Notes (Signed)
Per Dr. Irene Limbo - ok to treat today w/ANC 0.7

## 2019-04-01 NOTE — Patient Instructions (Signed)

## 2019-04-01 NOTE — Patient Instructions (Signed)
Cancer Center Discharge Instructions for Patients Receiving Chemotherapy  Today you received the following chemotherapy agents Rituximab (RITUXAN).  To help prevent nausea and vomiting after your treatment, we encourage you to take your nausea medication as prescribed.   If you develop nausea and vomiting that is not controlled by your nausea medication, call the clinic.   BELOW ARE SYMPTOMS THAT SHOULD BE REPORTED IMMEDIATELY:  *FEVER GREATER THAN 100.5 F  *CHILLS WITH OR WITHOUT FEVER  NAUSEA AND VOMITING THAT IS NOT CONTROLLED WITH YOUR NAUSEA MEDICATION  *UNUSUAL SHORTNESS OF BREATH  *UNUSUAL BRUISING OR BLEEDING  TENDERNESS IN MOUTH AND THROAT WITH OR WITHOUT PRESENCE OF ULCERS  *URINARY PROBLEMS  *BOWEL PROBLEMS  UNUSUAL RASH Items with * indicate a potential emergency and should be followed up as soon as possible.  Feel free to call the clinic should you have any questions or concerns. The clinic phone number is [phone].  Please show the CHEMO ALERT CARD at check-in to the Emergency Department and triage nurse.  Coronavirus (COVID-19) Are you at risk?  Are you at risk for the Coronavirus (COVID-19)?  To be considered HIGH RISK for Coronavirus (COVID-19), you have to meet the following criteria:  . Traveled to [place], [place], [place], [place] or [place]; or in the [place] to [place], [place], [place], or [place]; and have fever, cough, and shortness of breath within the last 2 weeks of travel OR . Been in close contact with a person diagnosed with COVID-19 within the last 2 weeks and have fever, cough, and shortness of breath . IF YOU DO NOT MEET THESE CRITERIA, YOU ARE CONSIDERED LOW RISK FOR COVID-19.  What to do if you are HIGH RISK for COVID-19?  . If you are having a medical emergency, call 911. . Seek medical care right away. Before you go to a doctor's office, urgent care or emergency department, call ahead and tell them  about your recent travel, contact with someone diagnosed with COVID-19, and your symptoms. You should receive instructions from your physician's office regarding next steps of care.  . When you arrive at healthcare provider, tell the healthcare staff immediately you have returned from visiting [place], [place], [place], [place] or [place]; or traveled in the [place] to [place], [place], [place], or [place]; in the last two weeks or you have been in close contact with a person diagnosed with COVID-19 in the last 2 weeks.   . Tell the health care staff about your symptoms: fever, cough and shortness of breath. . After you have been seen by a medical provider, you will be either: o Tested for (COVID-19) and discharged home on quarantine except to seek medical care if symptoms worsen, and asked to  - Stay home and avoid contact with others until you get your results (4-5 days)  - Avoid travel on public transportation if possible (such as bus, train, or airplane) or o Sent to the Emergency Department by EMS for evaluation, COVID-19 testing, and possible admission depending on your condition and test results.  What to do if you are LOW RISK for COVID-19?  Reduce your risk of any infection by using the same precautions used for avoiding the common cold or flu:  . Wash your hands often with soap and warm water for at least 20 seconds.  If soap and water are not readily available, use an alcohol-based hand sanitizer with at least 60% alcohol.  . If coughing or   sneezing, cover your mouth and nose by coughing or sneezing into the elbow areas of your shirt or coat, into a tissue or into your sleeve (not your hands). . Avoid shaking hands with others and consider head nods or verbal greetings only. . Avoid touching your eyes, nose, or mouth with unwashed hands.  . Avoid close contact with people who are sick. . Avoid places or events with large numbers of people in one location, like concerts or  sporting events. . Carefully consider travel plans you have or are making. . If you are planning any travel outside or inside the [place], visit the CDC's Travelers' Health webpage for the latest health notices. . If you have some symptoms but not all symptoms, continue to monitor at home and seek medical attention if your symptoms worsen. . If you are having a medical emergency, call 911.   [place] / e-Visit: [email or web address]         [facility]: [facility]: [phone]                   [facility]: [phone]

## 2019-04-01 NOTE — Progress Notes (Signed)
Outter side of dual port was accessed today, no blood return noted. PT refused CATH-FLOW wanted there port assessed instead.

## 2019-04-02 ENCOUNTER — Telehealth: Payer: Self-pay | Admitting: Hematology

## 2019-04-02 NOTE — Telephone Encounter (Signed)
Called and scheduled appt per 4/9 los.  Left a VM of the lab appt that had to be scheduled.

## 2019-04-07 ENCOUNTER — Telehealth: Payer: Self-pay | Admitting: *Deleted

## 2019-04-07 NOTE — Telephone Encounter (Signed)
Patient called - finishing second break of 7 days from Revlimid tomorrow. Total break of 14 days per Dr. Irene Limbo because his immune system seemed down and he needed a longer break. Supposed to have lab work done at the end of the break, but labs aren't scheduled until Monday.  Per Dr. Irene Limbo, can have labs completed tomorrow. Lab appt scheduled for Monday 4/20 cancelled. Schedule msg sent for lab appt 4/16 at 8am.  Patient verbalized understanding.

## 2019-04-08 ENCOUNTER — Other Ambulatory Visit: Payer: Self-pay

## 2019-04-08 ENCOUNTER — Inpatient Hospital Stay: Payer: No Typology Code available for payment source

## 2019-04-08 ENCOUNTER — Telehealth: Payer: Self-pay | Admitting: *Deleted

## 2019-04-08 DIAGNOSIS — Z5112 Encounter for antineoplastic immunotherapy: Secondary | ICD-10-CM | POA: Diagnosis not present

## 2019-04-08 DIAGNOSIS — C8298 Follicular lymphoma, unspecified, lymph nodes of multiple sites: Secondary | ICD-10-CM

## 2019-04-08 DIAGNOSIS — D702 Other drug-induced agranulocytosis: Secondary | ICD-10-CM

## 2019-04-08 DIAGNOSIS — C8330 Diffuse large B-cell lymphoma, unspecified site: Secondary | ICD-10-CM

## 2019-04-08 LAB — CBC WITH DIFFERENTIAL/PLATELET
Abs Immature Granulocytes: 0.08 10*3/uL — ABNORMAL HIGH (ref 0.00–0.07)
Basophils Absolute: 0.1 10*3/uL (ref 0.0–0.1)
Basophils Relative: 4 %
Eosinophils Absolute: 0.1 10*3/uL (ref 0.0–0.5)
Eosinophils Relative: 4 %
HCT: 43.1 % (ref 39.0–52.0)
Hemoglobin: 14.5 g/dL (ref 13.0–17.0)
Immature Granulocytes: 4 %
Lymphocytes Relative: 46 %
Lymphs Abs: 1 10*3/uL (ref 0.7–4.0)
MCH: 29.7 pg (ref 26.0–34.0)
MCHC: 33.6 g/dL (ref 30.0–36.0)
MCV: 88.1 fL (ref 80.0–100.0)
Monocytes Absolute: 0.5 10*3/uL (ref 0.1–1.0)
Monocytes Relative: 24 %
Neutro Abs: 0.4 10*3/uL — CL (ref 1.7–7.7)
Neutrophils Relative %: 18 %
Platelets: 129 10*3/uL — ABNORMAL LOW (ref 150–400)
RBC: 4.89 MIL/uL (ref 4.22–5.81)
RDW: 14 % (ref 11.5–15.5)
WBC: 2.2 10*3/uL — ABNORMAL LOW (ref 4.0–10.5)
nRBC: 0 % (ref 0.0–0.2)

## 2019-04-08 LAB — BASIC METABOLIC PANEL
Anion gap: 9 (ref 5–15)
BUN: 17 mg/dL (ref 8–23)
CO2: 24 mmol/L (ref 22–32)
Calcium: 8.7 mg/dL — ABNORMAL LOW (ref 8.9–10.3)
Chloride: 108 mmol/L (ref 98–111)
Creatinine, Ser: 1.02 mg/dL (ref 0.61–1.24)
GFR calc Af Amer: >60 mL/min (ref >60)
GFR calc non Af Amer: >60 mL/min (ref >60)
Glucose, Bld: 100 mg/dL — ABNORMAL HIGH (ref 70–99)
Potassium: 4.1 mmol/L (ref 3.5–5.1)
Sodium: 141 mmol/L (ref 135–145)

## 2019-04-08 NOTE — Telephone Encounter (Signed)
Contacted patient. ANC today 0.2. Per Dr. Irene Limbo, inform patient not to restart Revlimid at this time as ANC needs to be above 1.0k/ul to restart. Patient is to repeat CBCs/diff in 10 days. Informed patient. Sent schedule message for lab appt on 4/27 at 8am. Patient aware of time for lab appt.

## 2019-04-12 ENCOUNTER — Other Ambulatory Visit: Payer: Self-pay

## 2019-04-19 ENCOUNTER — Telehealth: Payer: Self-pay | Admitting: *Deleted

## 2019-04-19 ENCOUNTER — Inpatient Hospital Stay: Payer: No Typology Code available for payment source

## 2019-04-19 ENCOUNTER — Other Ambulatory Visit: Payer: Self-pay

## 2019-04-19 DIAGNOSIS — C8330 Diffuse large B-cell lymphoma, unspecified site: Secondary | ICD-10-CM

## 2019-04-19 DIAGNOSIS — Z5112 Encounter for antineoplastic immunotherapy: Secondary | ICD-10-CM | POA: Diagnosis not present

## 2019-04-19 LAB — CBC WITH DIFFERENTIAL (CANCER CENTER ONLY)
Abs Immature Granulocytes: 0.05 10*3/uL (ref 0.00–0.07)
Basophils Absolute: 0 10*3/uL (ref 0.0–0.1)
Basophils Relative: 2 %
Eosinophils Absolute: 0.1 10*3/uL (ref 0.0–0.5)
Eosinophils Relative: 5 %
HCT: 43.4 % (ref 39.0–52.0)
Hemoglobin: 14.5 g/dL (ref 13.0–17.0)
Immature Granulocytes: 2 %
Lymphocytes Relative: 45 %
Lymphs Abs: 1.2 10*3/uL (ref 0.7–4.0)
MCH: 29.7 pg (ref 26.0–34.0)
MCHC: 33.4 g/dL (ref 30.0–36.0)
MCV: 88.9 fL (ref 80.0–100.0)
Monocytes Absolute: 0.5 10*3/uL (ref 0.1–1.0)
Monocytes Relative: 18 %
Neutro Abs: 0.7 10*3/uL — ABNORMAL LOW (ref 1.7–7.7)
Neutrophils Relative %: 28 %
Platelet Count: 100 10*3/uL — ABNORMAL LOW (ref 150–400)
RBC: 4.88 MIL/uL (ref 4.22–5.81)
RDW: 13.8 % (ref 11.5–15.5)
WBC Count: 2.6 10*3/uL — ABNORMAL LOW (ref 4.0–10.5)
nRBC: 0 % (ref 0.0–0.2)

## 2019-04-19 NOTE — Telephone Encounter (Signed)
Patient called regarding lab results. ANC 0.7. Dr. Irene Limbo asked that patient be directed to continue holding Revlimid at this time. Dr. Irene Limbo will review repeated labs on 5/7 and discuss plan. Patient verbalized understanding.

## 2019-04-22 DIAGNOSIS — G479 Sleep disorder, unspecified: Secondary | ICD-10-CM | POA: Diagnosis not present

## 2019-04-22 DIAGNOSIS — R69 Illness, unspecified: Secondary | ICD-10-CM | POA: Diagnosis not present

## 2019-04-28 NOTE — Progress Notes (Signed)
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 Cytogenetics and Fish showed BCL 2 positivity but negative for BCL 6 and cMYC CD10 positivity suggestive of possibly GCB subtype.  Patient received first cycle of R CHOP on 08/08/2015 and intrathecal methotrexate with hydrocortisone on 08/09/2015. Patient subsequently was admitted with ARDS likely due to neulasta -treated with high dose steroids and empirically with broad spectrum antibiotics.  2nd cycle of chemotherapy delayed to allow for rehabilitation (was due on 08/31/2015).  Received R-mini-CHOP for cycle 2 and tolerated it without significant cytopenias. Was admitted briefly for a mild lower extremity cellulitis and DVT. Cellulitis now resolved. Patient completing his oral antibiotics.  PET/CT scan on 09/29/2015 with no evidence of disease progression. Good response in his mesenteric lymph nodes. Inguinal  lymph nodes on the right side. Decreased uptake in his C5 level.  No new lesions noted.   Cycle 3 (10/02/2015)  of R- CHOP ( we increased the doxorubicin dose back to 50 mg/m and keep the cyclophosphamide dose reduction at 400 mg/m]  Cycle 4 (10/23/2015) of R-CHOP ( we increased the doxorubicin dose back to 50 mg/m and kept the cyclophosphamide dose reduction at 400 mg/m].  PET/CT 11/10/2015: Shows improvement in most lesions but is noted to have a new FDG avid lymph node in the mesentery of the small bowel.  Cycle 5 on 11/13/2015 R CHOP - received full dose.  Cycle 6 on 12/11/2015 R-CHOP full dose.  Admitted with neutropenic fevers and treatment for possible early pneumonia.  This resolved and he was able to discharge home  01/09/2016 through 01/22/2016: The patient was treated to the C4-C6 levels of the spine to a dose of 30 gray in 10 fractions using a 2 field technique. The patient was also treated to an abdominal lymph node region to a dose of 30 gray in 10 fractions using a 3 field technique. This treatment consisted of a 3-D conformal technique with daily image guidance utilized.   Repeat PET CT scan in March 12 2016 -showed some enlarged mesenteric lymph nodes which were FDG avid.  CT guided FNA of mesenteric lymph nodes at [facility] on 03/29/2016 - showed scant cellularity. No overt evidence of lymphoma.  Rpt PET/CT 05/15/2016 at [facility]- show similar appearing enlarged mesenteric lymph nodes which are FDG avid. No change in size and no other evidence of disease progression.  PET/CT 07/24/2016 - and [facility] shows no evidence of disease progression  PET/CT 01/22/2017:  Result Impression   1.Stable low-level metabolic activity in small lymph nodes in the small bowel mesentery. No new FDG avid disease. 2.Persistent hypermetabolic soft tissue thickening overlying the coccyx which could relate to a decubitus ulcer and should be  amenable to direct inspection. 3.Stable pulmonary nodules which do not appear hypermetabolic, however, several are below the size resolution of PET in size. Continued attention on follow-up is suggested. 4.Previously seen hypermetabolic skin thickening near the right ear has resolved. 5.Ancillary CT findings as above.    INTERVAL HISTORY:   Mr. Buckbee is here for management and evaluation of [name], grade 1-2. The patient's last visit with [place] was on 04/01/19. The pt reports that he is doing well overall.  The pt reports that he has not had any fevers, chills or concerns for infections. He does not that he has developed a tongue ulcer after biting his tongue, and has been using a warm salt water mouthwash which has helped. He denies other mouth sores.  He notes that his right inguinal lymph node "is not near as bad," and has decreased. He denies leg swelling.  Lab results today (04/29/19) of CBC w/diff and CMP is as follows: all values are WNL except for WBC at 2.2k, PLT at 123k, ANC at 400, Glucose at 101, Calcium at 8.1.  On review of systems, pt reports stable energy levels, decreased right inguinal lymph node, and denies fevers, chills, concerns for infections, leg swelling, abdominal pains, and any other symptoms.   REVIEW OF SYSTEMS:    A 10+ POINT REVIEW OF SYSTEMS WAS OBTAINED including neurology, dermatology, psychiatry, cardiac, respiratory, lymph, extremities, GI, GU, Musculoskeletal, constitutional, breasts, reproductive, HEENT.  All pertinent positives are noted in the HPI.  All others are negative.   ALLERGIES:  is allergic to neulasta [pegfilgrastim].  MEDICATIONS:  Current Outpatient Medications  Medication Sig Dispense Refill  . diazepam (VALIUM) 2 MG tablet Take 2 mg by mouth daily as needed for anxiety.    [name] Kitchen HYDROcodone-acetaminophen (NORCO) 5-325 MG tablet Take 1-2 tablets by mouth every 6 (six) hours as needed for moderate pain or  severe pain. (Patient not taking: Reported on 12/17/2018) 20 tablet 0  . levothyroxine (SYNTHROID, LEVOTHROID) 88 MCG tablet Take 88 mcg by mouth daily before breakfast.   2  . lidocaine-prilocaine (EMLA) cream Apply 1 application topically as needed (for port access).    . Multiple Vitamin (MULTIVITAMIN WITH MINERALS) TABS tablet Take 1 tablet by mouth daily.     [name] Kitchen nystatin (MYCOSTATIN) 100000 UNIT/ML suspension Take 5 mLs (500,000 Units total) by mouth 4 (four) times daily for 10 days. 200 mL 0  . ondansetron (ZOFRAN) 8 MG tablet Take 1 tablet (8 mg total) by mouth every 8 (eight) hours as needed for nausea or vomiting. 30 tablet 1  . prochlorperazine (COMPAZINE) 10 MG tablet Take 1 tablet (10 mg total) by mouth every 8 (eight) hours as needed for nausea or vomiting. 30 tablet 0  . rivaroxaban (XARELTO) 10 MG TABS tablet Take 1 tablet (10 mg total) by mouth daily. 30 tablet 5  . senna (SENOKOT) 8.6 MG TABS tablet Take 2 tablets by mouth at bedtime.    . simvastatin (ZOCOR) 20 MG tablet Take 20 mg by mouth daily.    . temazepam (RESTORIL) 15 MG capsule Take 15 mg by mouth at bedtime as needed for sleep.     . valACYclovir (VALTREX) 500 MG tablet Take 1 tablet (500 mg total) by mouth 2 (  two) times daily for 7 days. 14 tablet 0   No current facility-administered medications for this visit.     PHYSICAL EXAMINATION: ECOG PERFORMANCE STATUS: 1 - Symptomatic but completely ambulatory  Vitals:   04/29/19 1027  BP: 130/79  Pulse: 66  Resp: 17  Temp: 97.9 F (36.6 C)  SpO2: 100%   Filed Weights   04/29/19 1027  Weight: 222 lb (100.7 kg)    GENERAL:alert, in no acute distress and comfortable SKIN: no acute rashes, no significant lesions EYES: conjunctiva are pink and non-injected, sclera anicteric OROPHARYNX: MMM, no exudates, no oropharyngeal erythema or ulceration NECK: supple, no JVD LYMPH: Minimally palpable right inguinal lymph node about 1.5cm, no palpable lymphadenopathy in the  cervical or axillary regions LUNGS: clear to auscultation b/l with normal respiratory effort HEART: regular rate & rhythm ABDOMEN:  normoactive bowel sounds , non tender, not distended. No palpable hepatosplenomegaly.  Extremity: no pedal edema PSYCH: alert & oriented x 3 with fluent speech NEURO: no focal motor/sensory deficits   LABORATORY DATA:   CBC Latest Ref Rng & Units 04/29/2019 04/19/2019 04/08/2019  WBC 4.0 - 10.5 K/uL 2.2(L) 2.6(L) 2.2(L)  Hemoglobin 13.0 - 17.0 g/dL 14.2 14.5 14.5  Hematocrit 39.0 - 52.0 % 42.0 43.4 43.1  Platelets 150 - 400 K/uL 123(L) 100(L) 129(L)     CMP Latest Ref Rng & Units 04/29/2019 04/08/2019 04/01/2019  Glucose 70 - 99 mg/dL 101(H) 100(H) 85  BUN 8 - 23 mg/dL _0 Creatinine 0.61 - 1.24 mg/dL 0.95 1.02 0.94  Sodium 135 - 145 mmol/L 142 141 141  Potassium 3.5 - 5.1 mmol/L 4.1 4.1 4.3  Chloride 98 - 111 mmol/L 108 108 109  CO2 22 - 32 mmol/L _1 Calcium 8.9 - 10.3 mg/dL 8.1(L) 8.7(L) 8.6(L)  Total Protein 6.5 - 8.1 g/dL 6.5 - 6.1(L)  Total Bilirubin 0.3 - 1.2 mg/dL 0.4 - 0.5  Alkaline Phos 38 - 126 U/L 88 - 74  AST 15 - 41 U/L 28 - 39  ALT 0 - 44 U/L 32 - 44   . Lab Results  Component Value Date   LDH 262 (H) 04/01/2019    Fine Needle Aspiration, Lymph NodeResulted: 12/04/2018 1:14 PM [facility] Result Narrative  ACCESSION NUMBER: [id number] RECEIVED: 11/27/2018 ORDERING PHYSICIAN: RAKHEE Megan Mans , MD PATIENT NAME: Docia Barrier LYN CYTOLOGY REPORT * Amended * Final Cytologic Interpretation AMENDED REPORT  AMENDED 12/04/2018 TO ADD DIAGNOSIS   A. Right inguinal lymph node, Fine Needle Aspiration II (smears and cell block): B-cell lymphoma with germinal center phenotype (see comment)  Specimen Adequacy: Satisfactory for evaluation.  B. Cytology core biopsy: B-cell lymphoma with germinal center phenotype (see comment)  Specimen Adequacy: Satisfactory for evaluation.     COMMENT:Sections of the core biopsy demonstrate an atypical vaguely nodular lymphoid infiltrate composed of medium sized lymphoid cells with mature chromatin, irregular nuclear membranes and scant cytoplasm. Focally some areas show larger cells. Few mitotic figures are present. Appropriately controlled immunohistochemical stains are performed. Sheets of B-cells are highlighted by CD20. These B-cells co-express BCL2 and BCL6 (focal, nodular distribution). They are negative for CD30 and MYC. CD21 demonstrates few residual disrupted follicular dendritic meshworks. The proliferation index is low (10-20%), as demonstrated by Ki-67. Scattered small T-cells are highlighted by CD3. Concurrent flow cytometry identified a kappa light chain restricted B-cell population, co-expressing CD10.  Overall, the findings are consistent with involvement by a B-cell lymphoma with a germinal center phenotype (GC-type),  with focal follicular architecture and a low proliferation index. Given the nature of the sample (i.e. core biopsy) a final architectural subclassification is not possible. If clinically feasible, an excisional biopsy is recommended.  Correlation with clinical and cytogenetic/molecular data is recommended.    12/11/18 Inguinal LN Biopsy  Interpretation Tissue-Flow Cytometry - MONOCLONAL B-CELL POPULATION EXPRESSING CD10 COMPRISES 92% OF ALL LYMPHOCYTES - SEE COMMENT Microscopic Gated population: Flow cytometric immunophenotyping is performed using antibiodies to the antigens listed in the table below. Electronic gates are placed around a cell cluster displaying light scatter properties corresponding to lymphocytes. - Abnormal Cells in gated population: 92 % - Phenotype of Abnormal Cells: CD10, CD19, CD20, CD21, CD22, HLA-Dr, Kappa Diagnosis Lymph node for lymphoma, Deep Right Inguinal - FOLLICULAR LYMPHOMA, GRADE 1-2 - SEE COMMENT Microscopic Comment The nodal architecture is  effaced by a proliferation back to back follicles composed of medium to large lymphocytes with irregular, cleaved nuclei and scattered centroblasts. There are no diffuse areas of growth present within the examined nodal material. Immunohistochemistry performed on the tissue highlights a nodular B-cell proliferation with expression of CD20, CD10, bcl-6, and Bcl-2. CD21 highlights expanded follicular dendritic meshworks. The Ki-67 shows a slightly increased proliferation rate of 30-40%. The atypical lymphocytes are negative for CD5. CD3 immunohistochemistry highlights the background T-cells. Flow cytometry was performed. A monoclonal B-cell populaton expressing CD10 comprises 92% of all lymphocytes (See [phone]). Overall, the findings are consistent with a diagnosis of low grade follicular lymphoma, Grade 1-2. Of note, the increased ki-67 may portend a more aggressive course despite the low histologic grade.   01/18/19 BM Biopsy:     RADIOGRAPHIC STUDIES: I have personally reviewed the radiological images as listed and agreed with the findings in the report. No results found.    ASSESSMENT & PLAN:   1) Diffuse large B-cell lymphoma stage IV AE with involvement of mesenteric, inguinal lymph nodes and C5 vertebral body with spinal cord impingement  Patient is status post six cycles of R-CHOP and R-mini-CHOP, completed in 2016, as noted above.  2) History of capillary leak syndrome with ARDS requiring intubation likely due to G-CSF.  3) Newly diagnosed Follicular Lymphoma, Grade 1-2   11/02/18 CT A/P which revealed Negative for inguinal or bowel hernia but positive for bulky right inguinal and external iliac lymphadenopathy which is new since a 2017 PET-CT and highly suspicious for recurrent Lymphoma. This would be amenable to Ultrasound-guided needle biopsy. 2. No abdominal or contralateral left hemi pelvic lymphadenopathy. No other acute findings identified in the abdomen or pelvis.  3. Chronic pneumobilia.  Aortic Atherosclerosis.  11/12/18 PET/CT which revealed  Interval development of multistation lymphadenopathy in the abdomen and pelvis as detailed above- [place] 5. 2. Presence of pneumobilia could reflect sphincter of Oddi dysfunction versus recent procedure. Correlate with clinical history and upcoming comprehensive metabolic panel. 3. Ancillary CT findings   11/27/18 US guided LN Biopsy pathology report is as noted above. " B-cell lymphoma with a germinal center phenotype (GC-type), with focal follicular architecture and a low proliferation index. Given the nature of the sample (i.e. core biopsy) a final architectural subclassification is not possible. If clinically feasible, an excisional biopsy is recommended."  12/11/18 Right Inguinal LN biopsy revealed Grade 1-2 Follicular Lymphoma  2/0/94 CT A/P revealed Today's study demonstrates clear progression of disease with increased number and size of numerous enlarged lymph nodes throughout the lower thorax, abdomen and pelvis, as detailed above. 2. In addition, there are several small pulmonary nodules in the  lung bases. The largest of these are clustered in the posterior aspect of the right lower lobe in an apparent area of scarring which is very similar to prior PET-CT 03/12/2016, favored to be benign. However, the smaller nodules which measure up to 6 mm in size are nonspecific and warrant attention on follow-up studies. 3. Aortic atherosclerosis, in addition to least 2 vessel coronary artery disease. Assessment for potential risk factor modification, dietary therapy or pharmacologic therapy may be warranted, if clinically indicated. 4. Additional incidental findings, as above. 01/18/19 BM Bx revealed normocellular bone marrow with trilineage hematopoiesis Stage II indicated with 01/26/19 CT  Began C1 of 78m Revlimid and Rituxan on 02/05/19  4) History of right lower extremity distal DVT. Resolved. S/p 6 months of  treatment in 02/2016 -Returning to 129mXarelto while taking Revlimid  5) Mild chronic thrombocytopenia- resolved 03/05/19 PLT at 175k  PLAN:  -Discussed pt labwork today, 04/29/19; ANC now at 400. PLT stable at 123k. HGB normal at 14.2 -Will continue holding Revlimid given persisting neutropenia -Discussed that his neutropenia could be an immunologic neutropenia related to Rituxan's treatment of his lymphoma -Discussed the risks vs benefits of continuing with his seventh Rituxan infusion today, which pt would prefer to continue with today -Will check labs again in 2 weeks to watch neutropenia -Will order BM Bx for further evaluation to rule out lymphoma causing neutropenia -Will repeat PET/CT to evaluate treatment efficacy thus far and for further treatment planning -Will continue to hold Neupogen unless pt's neutropenia worsens or in the event of an infection, given his history of a previous allergic reaction to Neulasta -Pt will present to the ED if he develops fevers -Antiviral and antifungal and social isolation -Recommend salt and baking soda mouthwashes 4 times a day -Nystatin mouthwash -Continue [id number] for VTE prophylaxis for now, while still making final decisions about Revlimid -The pt tolerated 4 weekly cycles of Rituxan well, and is now pursuing monthly Rituxan infusions for planned 6 months. -Planning for Revlimid 10-12 cycles -Recommend Zofran for nausea, Compazine as needed -Recommend Claritin or Zyrtec for mild itching, in addition to lotion application and avoid long, hot showers -Port flush every 6 weeks -Recommended that the pt continue to eat well, drink at least 48-64 oz of water each day, and walk 20-30 minutes each day. -Will see the pt back in 3 weeks   Labs, CT bone marrow biopsy and PET/CT in 2 weeks RTC with Dr KalIrene Limbo 3 weeks   The total time spent in the appt was 35 minutes and more than 50% was on counseling and direct patient cares.   GauSullivan Lone  MS Hematology/Oncology Physician ConSaxon Surgical CenterOffice):       [phone] cell):  [phone]):           [phone], SchBaldwin Jamaicam acting as a scribe for Dr. GauSullivan Lone .I have reviewed the above documentation for accuracy and completeness, and I agree with the above. .GaBrunetta Genera

## 2019-04-29 ENCOUNTER — Inpatient Hospital Stay: Payer: No Typology Code available for payment source

## 2019-04-29 ENCOUNTER — Other Ambulatory Visit: Payer: Self-pay | Admitting: Hematology

## 2019-04-29 ENCOUNTER — Other Ambulatory Visit: Payer: Self-pay

## 2019-04-29 ENCOUNTER — Inpatient Hospital Stay (HOSPITAL_BASED_OUTPATIENT_CLINIC_OR_DEPARTMENT_OTHER): Payer: No Typology Code available for payment source | Admitting: Hematology

## 2019-04-29 ENCOUNTER — Other Ambulatory Visit: Payer: Self-pay | Admitting: *Deleted

## 2019-04-29 ENCOUNTER — Inpatient Hospital Stay: Payer: No Typology Code available for payment source | Attending: Hematology

## 2019-04-29 ENCOUNTER — Telehealth: Payer: Self-pay | Admitting: Hematology

## 2019-04-29 VITALS — BP 134/69 | HR 63 | Temp 98.0°F | Resp 16

## 2019-04-29 VITALS — BP 130/79 | HR 66 | Temp 97.9°F | Resp 17 | Ht 73.0 in | Wt 222.0 lb

## 2019-04-29 DIAGNOSIS — Z5112 Encounter for antineoplastic immunotherapy: Secondary | ICD-10-CM | POA: Insufficient documentation

## 2019-04-29 DIAGNOSIS — D702 Other drug-induced agranulocytosis: Secondary | ICD-10-CM

## 2019-04-29 DIAGNOSIS — Z79899 Other long term (current) drug therapy: Secondary | ICD-10-CM | POA: Diagnosis not present

## 2019-04-29 DIAGNOSIS — Z85828 Personal history of other malignant neoplasm of skin: Secondary | ICD-10-CM | POA: Diagnosis present

## 2019-04-29 DIAGNOSIS — K140 Glossitis: Secondary | ICD-10-CM | POA: Diagnosis not present

## 2019-04-29 DIAGNOSIS — C8298 Follicular lymphoma, unspecified, lymph nodes of multiple sites: Secondary | ICD-10-CM | POA: Diagnosis present

## 2019-04-29 DIAGNOSIS — Z298 Encounter for other specified prophylactic measures: Secondary | ICD-10-CM

## 2019-04-29 DIAGNOSIS — C8330 Diffuse large B-cell lymphoma, unspecified site: Secondary | ICD-10-CM

## 2019-04-29 DIAGNOSIS — D709 Neutropenia, unspecified: Secondary | ICD-10-CM

## 2019-04-29 DIAGNOSIS — Z7901 Long term (current) use of anticoagulants: Secondary | ICD-10-CM | POA: Insufficient documentation

## 2019-04-29 DIAGNOSIS — Z86718 Personal history of other venous thrombosis and embolism: Secondary | ICD-10-CM | POA: Diagnosis not present

## 2019-04-29 DIAGNOSIS — Z95828 Presence of other vascular implants and grafts: Secondary | ICD-10-CM

## 2019-04-29 LAB — SAMPLE TO BLOOD BANK

## 2019-04-29 LAB — CMP (CANCER CENTER ONLY)
ALT: 32 U/L (ref 0–44)
AST: 28 U/L (ref 15–41)
Albumin: 3.8 g/dL (ref 3.5–5.0)
Alkaline Phosphatase: 88 U/L (ref 38–126)
Anion gap: 7 (ref 5–15)
BUN: 19 mg/dL (ref 8–23)
CO2: 27 mmol/L (ref 22–32)
Calcium: 8.1 mg/dL — ABNORMAL LOW (ref 8.9–10.3)
Chloride: 108 mmol/L (ref 98–111)
Creatinine: 0.95 mg/dL (ref 0.61–1.24)
GFR, Est AFR Am: >60 mL/min (ref >60)
GFR, Estimated: >60 mL/min (ref >60)
Glucose, Bld: 101 mg/dL — ABNORMAL HIGH (ref 70–99)
Potassium: 4.1 mmol/L (ref 3.5–5.1)
Sodium: 142 mmol/L (ref 135–145)
Total Bilirubin: 0.4 mg/dL (ref 0.3–1.2)
Total Protein: 6.5 g/dL (ref 6.5–8.1)

## 2019-04-29 LAB — CBC WITH DIFFERENTIAL (CANCER CENTER ONLY)
Abs Immature Granulocytes: 0 10*3/uL (ref 0.00–0.07)
Basophils Absolute: 0 10*3/uL (ref 0.0–0.1)
Basophils Relative: 1 %
Eosinophils Absolute: 0.1 10*3/uL (ref 0.0–0.5)
Eosinophils Relative: 5 %
HCT: 42 % (ref 39.0–52.0)
Hemoglobin: 14.2 g/dL (ref 13.0–17.0)
Immature Granulocytes: 0 %
Lymphocytes Relative: 52 %
Lymphs Abs: 1.2 10*3/uL (ref 0.7–4.0)
MCH: 29.8 pg (ref 26.0–34.0)
MCHC: 33.8 g/dL (ref 30.0–36.0)
MCV: 88.2 fL (ref 80.0–100.0)
Monocytes Absolute: 0.6 10*3/uL (ref 0.1–1.0)
Monocytes Relative: 25 %
Neutro Abs: 0.4 10*3/uL — CL (ref 1.7–7.7)
Neutrophils Relative %: 17 %
Platelet Count: 123 10*3/uL — ABNORMAL LOW (ref 150–400)
RBC: 4.76 MIL/uL (ref 4.22–5.81)
RDW: 13.9 % (ref 11.5–15.5)
WBC Count: 2.2 10*3/uL — ABNORMAL LOW (ref 4.0–10.5)
nRBC: 0 % (ref 0.0–0.2)

## 2019-04-29 MED ORDER — DIPHENHYDRAMINE HCL 25 MG PO CAPS
ORAL_CAPSULE | ORAL | Status: AC
  Filled 2019-04-29: qty 2

## 2019-04-29 MED ORDER — SODIUM CHLORIDE 0.9 % IV SOLN
Freq: Once | INTRAVENOUS | Status: AC
  Administered 2019-04-29: 12:00:00 via INTRAVENOUS
  Filled 2019-04-29: qty 250

## 2019-04-29 MED ORDER — ACETAMINOPHEN 325 MG PO TABS
ORAL_TABLET | ORAL | Status: AC
  Filled 2019-04-29: qty 2

## 2019-04-29 MED ORDER — NYSTATIN 100000 UNIT/ML MT SUSP: 5.0000 mL | Freq: Four times a day (QID) | OROMUCOSAL | 0 refills | Status: AC

## 2019-04-29 MED ORDER — DIPHENHYDRAMINE HCL 25 MG PO CAPS
50.0000 mg | ORAL_CAPSULE | Freq: Once | ORAL | Status: AC
  Administered 2019-04-29: 12:00:00 50 mg via ORAL

## 2019-04-29 MED ORDER — ACETAMINOPHEN 325 MG PO TABS
650.0000 mg | ORAL_TABLET | Freq: Once | ORAL | Status: AC
  Administered 2019-04-29: 12:00:00 650 mg via ORAL

## 2019-04-29 MED ORDER — SODIUM CHLORIDE 0.9 % IV SOLN
375.0000 mg/m2 | Freq: Once | INTRAVENOUS | Status: AC
  Administered 2019-04-29: 13:00:00 900 mg via INTRAVENOUS
  Filled 2019-04-29: qty 50

## 2019-04-29 MED ORDER — HEPARIN SOD (PORK) LOCK FLUSH 100 UNIT/ML IV SOLN
500.0000 [IU] | Freq: Once | INTRAVENOUS | Status: AC | PRN
  Administered 2019-04-29: 15:00:00 500 [IU]
  Filled 2019-04-29: qty 5

## 2019-04-29 MED ORDER — VALACYCLOVIR HCL 500 MG PO TABS: 500.0000 mg | ORAL_TABLET | Freq: Two times a day (BID) | ORAL | 0 refills | Status: AC

## 2019-04-29 MED ORDER — SODIUM CHLORIDE 0.9% FLUSH
10.0000 mL | INTRAVENOUS | Status: DC | PRN
  Administered 2019-04-29: 15:00:00 10 mL
  Filled 2019-04-29: qty 10

## 2019-04-29 MED ORDER — METHYLPREDNISOLONE SODIUM SUCC 125 MG IJ SOLR
INTRAMUSCULAR | Status: AC
  Filled 2019-04-29: qty 2

## 2019-04-29 MED ORDER — METHYLPREDNISOLONE SODIUM SUCC 125 MG IJ SOLR
125.0000 mg | Freq: Every day | INTRAMUSCULAR | Status: DC
  Administered 2019-04-29: 12:00:00 125 mg via INTRAVENOUS

## 2019-04-29 MED ORDER — SODIUM CHLORIDE 0.9% FLUSH
10.0000 mL | Freq: Once | INTRAVENOUS | Status: AC
  Administered 2019-04-29: 10 mL
  Filled 2019-04-29: qty 10

## 2019-04-29 NOTE — Progress Notes (Signed)
Per Dr. Irene Limbo: OK to treat today with ANC 0.4. Verbal message given to Charge RN, Amy S.

## 2019-04-29 NOTE — Telephone Encounter (Signed)
Scheduled appt per 5/7 los. °

## 2019-04-29 NOTE — Patient Instructions (Signed)

## 2019-04-29 NOTE — Patient Instructions (Addendum)
[place] Discharge Instructions for Patients Receiving Chemotherapy  Today you received the following chemotherapy agents:  Rituximab  To help prevent nausea and vomiting after your treatment, we encourage you to take your nausea medication as prescribed   If you develop nausea and vomiting that is not controlled by your nausea medication, call the clinic.   BELOW ARE SYMPTOMS THAT SHOULD BE REPORTED IMMEDIATELY:  *FEVER GREATER THAN 100.5 F  *CHILLS WITH OR WITHOUT FEVER  NAUSEA AND VOMITING THAT IS NOT CONTROLLED WITH YOUR NAUSEA MEDICATION  *UNUSUAL SHORTNESS OF BREATH  *UNUSUAL BRUISING OR BLEEDING  TENDERNESS IN MOUTH AND THROAT WITH OR WITHOUT PRESENCE OF ULCERS  *URINARY PROBLEMS  *BOWEL PROBLEMS  UNUSUAL RASH Items with * indicate a potential emergency and should be followed up as soon as possible.  Feel free to call the clinic should you have any questions or concerns. The clinic phone number is [phone].  Please show the [place] at check-in to the Emergency Department and triage nurse.   COVID-19 Labs  No results for input(s): DDIMER, FERRITIN, LDH, CRP in the last 72 hours.  No results found for: [id number]

## 2019-05-04 ENCOUNTER — Other Ambulatory Visit: Payer: Self-pay | Admitting: Radiology

## 2019-05-05 ENCOUNTER — Ambulatory Visit (HOSPITAL_COMMUNITY)
Admission: RE | Admit: 2019-05-05 | Discharge: 2019-05-05 | Disposition: A | Discharge status: Home / Self Care | Payer: No Typology Code available for payment source | Source: Ambulatory Visit | Attending: Hematology | Admitting: Hematology

## 2019-05-05 ENCOUNTER — Encounter (HOSPITAL_COMMUNITY): Payer: Self-pay

## 2019-05-05 ENCOUNTER — Other Ambulatory Visit: Payer: Self-pay

## 2019-05-05 DIAGNOSIS — D708 Other neutropenia: Secondary | ICD-10-CM | POA: Diagnosis not present

## 2019-05-05 DIAGNOSIS — D702 Other drug-induced agranulocytosis: Secondary | ICD-10-CM | POA: Insufficient documentation

## 2019-05-05 DIAGNOSIS — K219 Gastro-esophageal reflux disease without esophagitis: Secondary | ICD-10-CM | POA: Diagnosis not present

## 2019-05-05 DIAGNOSIS — Z7901 Long term (current) use of anticoagulants: Secondary | ICD-10-CM | POA: Diagnosis not present

## 2019-05-05 DIAGNOSIS — D7589 Other specified diseases of blood and blood-forming organs: Secondary | ICD-10-CM | POA: Diagnosis not present

## 2019-05-05 DIAGNOSIS — D696 Thrombocytopenia, unspecified: Secondary | ICD-10-CM | POA: Diagnosis not present

## 2019-05-05 DIAGNOSIS — E039 Hypothyroidism, unspecified: Secondary | ICD-10-CM | POA: Insufficient documentation

## 2019-05-05 DIAGNOSIS — Z79890 Hormone replacement therapy (postmenopausal): Secondary | ICD-10-CM | POA: Diagnosis not present

## 2019-05-05 DIAGNOSIS — Z79899 Other long term (current) drug therapy: Secondary | ICD-10-CM | POA: Insufficient documentation

## 2019-05-05 DIAGNOSIS — F419 Anxiety disorder, unspecified: Secondary | ICD-10-CM | POA: Diagnosis not present

## 2019-05-05 DIAGNOSIS — C8298 Follicular lymphoma, unspecified, lymph nodes of multiple sites: Secondary | ICD-10-CM | POA: Insufficient documentation

## 2019-05-05 DIAGNOSIS — Z452 Encounter for adjustment and management of vascular access device: Secondary | ICD-10-CM | POA: Insufficient documentation

## 2019-05-05 DIAGNOSIS — R69 Illness, unspecified: Secondary | ICD-10-CM | POA: Diagnosis not present

## 2019-05-05 LAB — CBC WITH DIFFERENTIAL/PLATELET
Abs Immature Granulocytes: 0.01 10*3/uL (ref 0.00–0.07)
Basophils Absolute: 0 10*3/uL (ref 0.0–0.1)
Basophils Relative: 1 %
Eosinophils Absolute: 0.1 10*3/uL (ref 0.0–0.5)
Eosinophils Relative: 3 %
HCT: 44.6 % (ref 39.0–52.0)
Hemoglobin: 14.8 g/dL (ref 13.0–17.0)
Immature Granulocytes: 0 %
Lymphocytes Relative: 50 %
Lymphs Abs: 1.2 10*3/uL (ref 0.7–4.0)
MCH: 29.2 pg (ref 26.0–34.0)
MCHC: 33.2 g/dL (ref 30.0–36.0)
MCV: 88.1 fL (ref 80.0–100.0)
Monocytes Absolute: 0.6 10*3/uL (ref 0.1–1.0)
Monocytes Relative: 25 %
Neutro Abs: 0.5 10*3/uL — ABNORMAL LOW (ref 1.7–7.7)
Neutrophils Relative %: 21 %
Platelets: 145 10*3/uL — ABNORMAL LOW (ref 150–400)
RBC: 5.06 MIL/uL (ref 4.22–5.81)
RDW: 14.1 % (ref 11.5–15.5)
WBC: 2.3 10*3/uL — ABNORMAL LOW (ref 4.0–10.5)
nRBC: 0 % (ref 0.0–0.2)

## 2019-05-05 LAB — PROTIME-INR
INR: 1 (ref 0.8–1.2)
Prothrombin Time: 13.3 seconds (ref 11.4–15.2)

## 2019-05-05 MED ORDER — FENTANYL CITRATE (PF) 100 MCG/2ML IJ SOLN
INTRAMUSCULAR | Status: AC
  Filled 2019-05-05: qty 2

## 2019-05-05 MED ORDER — MIDAZOLAM HCL 2 MG/2ML IJ SOLN
INTRAMUSCULAR | Status: AC
  Filled 2019-05-05: qty 4

## 2019-05-05 MED ORDER — LIDOCAINE HCL (PF) 1 % IJ SOLN
INTRAMUSCULAR | Status: AC | PRN
  Administered 2019-05-05: 15 mL

## 2019-05-05 MED ORDER — SODIUM CHLORIDE 0.9 % IV SOLN
INTRAVENOUS | Status: DC
  Administered 2019-05-05: 08:00:00 via INTRAVENOUS

## 2019-05-05 MED ORDER — FENTANYL CITRATE (PF) 100 MCG/2ML IJ SOLN
INTRAMUSCULAR | Status: AC | PRN
  Administered 2019-05-05: 50 ug via INTRAVENOUS

## 2019-05-05 MED ORDER — MIDAZOLAM HCL 2 MG/2ML IJ SOLN
INTRAMUSCULAR | Status: AC | PRN
  Administered 2019-05-05 (×2): 1 mg via INTRAVENOUS

## 2019-05-05 NOTE — Progress Notes (Signed)
Spoke with wife,Lisa, via phone.  She will be providing ride home for pt.  She states she lives in [place] and may return home.  Informed her we would call when pt was done with procedure for an updated d/c time.  Informed her it will most likely be between 10-12 today.  She voiced understanding.

## 2019-05-05 NOTE — H&P (Signed)
Referring Physician(s): Brunetta Genera  Supervising Physician: Markus Daft  Patient Status:  [facility]  Chief Complaint: "I'm here for a bone marrow biopsy"   Subjective: Patient familiar to IR service from prior Port-A-Cath placement in 2016 with removal in 2018 as well as bone marrow biopsy on 01/18/2019.  He has a history of diffuse large B-cell lymphoma with prior chemotherapy and now with follicular lymphoma.  He also has persistent neutropenia and presents again today for repeat CT-guided bone marrow biopsy for further evaluation.  He denies fever, headache, chest pain, dyspnea, cough, abdominal/back pain, nausea, vomiting or bleeding.  Past Medical History:  Diagnosis Date  . Anxiety   . ARDS (adult respiratory distress syndrome) ([place])    2016 August at [facility]  . Arm numbness 08/07/15   Limited movement due to tumor on spine  . B-cell lymphoma ([place])   . Bone cancer ([place])     tumor on C5  . 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 Diffuse large B cell lymphoma ([place])    biposy 07/25/2105  . GERD (gastroesophageal reflux disease)   . H. pylori infection   . History of bleeding ulcers 1990's  . History of blood transfusion   . Hypothyroidism    Past Surgical History:  Procedure Laterality Date  . ANTERIOR CERVICAL CORPECTOMY N/A 07/26/2015   Procedure: Cervical five Corpectomy/Cervical four-six Fusion/Plate ;  Surgeon: Eustace Moore, MD;  Location: [facility];  Service: Neurosurgery;  Laterality: N/A;  C5 Corpectomy/C4-6 Fusion/Plate C4-6  . COLONOSCOPY    . EYE SURGERY Right    cataracts  . HERNIA REPAIR Bilateral    with mesh  . IR GENERIC HISTORICAL  02/03/2017   IR REMOVAL TUN ACCESS W/ PORT W/O FL MOD SED 02/03/2017 Markus Daft, MD [facility]-INTER[facility] RAD  . LYMPH NODE BIOPSY Right 12/11/2018   Procedure: EXCISIONAL BIOPSY OF DEEP RIGHT INGUINAL LYMPH NODE ERAS PATHWAY;  Surgeon: Fanny Skates, MD;  Location: [facility];  Service: General;  Laterality: Right;       Allergies:  Neulasta [pegfilgrastim]  Medications: Prior to Admission medications   Medication Sig Start Date End Date Taking? Authorizing Provider  levothyroxine (SYNTHROID, LEVOTHROID) 88 MCG tablet Take 88 mcg by mouth daily before breakfast.  08/16/18  Yes [provider]  Multiple Vitamin (MULTIVITAMIN WITH MINERALS) TABS tablet Take 1 tablet by mouth daily.    Yes [provider]  rivaroxaban (XARELTO) 10 MG TABS tablet Take 1 tablet (10 mg total) by mouth daily. 01/29/19  Yes Brunetta Genera, MD  simvastatin (ZOCOR) 20 MG tablet Take 20 mg by mouth daily.   Yes [provider]  diazepam (VALIUM) 2 MG tablet Take 2 mg by mouth daily as needed for anxiety.    [provider]  HYDROcodone-acetaminophen (NORCO) 5-325 MG tablet Take 1-2 tablets by mouth every 6 (six) hours as needed for moderate pain or severe pain. Patient not taking: Reported on 12/17/2018 12/11/18   Fanny Skates, MD  lidocaine-prilocaine (EMLA) cream Apply 1 application topically as needed (for port access).    [provider]  nystatin (MYCOSTATIN) 100000 UNIT/ML suspension Take 5 mLs (500,000 Units total) by mouth 4 (four) times daily for 10 days. 04/29/19 05/09/19  Brunetta Genera, MD  ondansetron (ZOFRAN) 8 MG tablet Take 1 tablet (8 mg total) by mouth every 8 (eight) hours as needed for nausea or vomiting. 02/11/19   Brunetta Genera, MD  prochlorperazine (COMPAZINE) 10 MG tablet Take 1 tablet (10  mg total) by mouth every 8 (eight) hours as needed for nausea or vomiting. 02/11/19   Brunetta Genera, MD  senna (SENOKOT) 8.6 MG TABS tablet Take 2 tablets by mouth at bedtime.    [provider]  temazepam (RESTORIL) 15 MG capsule Take 15 mg by mouth at bedtime as needed for sleep.     [provider]  valACYclovir (VALTREX) 500 MG tablet Take 1 tablet (500 mg total) by mouth 2 (two) times daily for 7 days. 04/29/19 05/06/19  Brunetta Genera, MD     Vital  Signs: Blood pressure 148/83, heart rate 68, respirations 18, temp 97.9, O2 sats 100% room air   Physical Exam awake, alert.  Chest clear to auscultation bilaterally.  Heart with regular rate and rhythm.  Abdomen soft, positive bowel sounds, nontender.  No lower extremity edema.  Imaging: No results found.  Labs:  CBC: Recent Labs    04/08/19 0808 04/19/19 0736 04/29/19 1005 05/05/19 0730  WBC 2.2* 2.6* 2.2* 2.3*  HGB 14.5 14.5 14.2 14.8  HCT 43.1 43.4 42.0 44.6  PLT 129* 100* 123* 145*    COAGS: Recent Labs    01/18/19 0750 05/05/19 0730  INR 0.99 1.0  APTT 30  --     BMP: Recent Labs    03/05/19 0929 04/01/19 0911 04/08/19 0808 04/29/19 1005  NA 142 141 141 142  K 4.2 4.3 4.1 4.1  CL 109 109 108 108  CO2 _0 GLUCOSE 77 85 100* 101*  BUN _1 CALCIUM 8.6* 8.6* 8.7* 8.1*  CREATININE 1.01 0.94 1.02 0.95  GFRNONAA >60 >60 >60 >60  GFRAA >60 >60 >60 >60    LIVER FUNCTION TESTS: Recent Labs    02/26/19 0829 03/05/19 0929 04/01/19 0911 04/29/19 1005  BILITOT 0.6 0.6 0.5 0.4  AST 27 25 39 28  ALT 28 31 44 32  ALKPHOS 65 74 74 88  PROT 6.1* 6.2* 6.1* 6.5  ALBUMIN 3.7 3.6 3.7 3.8    Assessment and Plan:  Pt with history of diffuse large B-cell lymphoma with prior chemotherapy and now with follicular lymphoma.  He also has persistent neutropenia and presents  today for  CT-guided bone marrow biopsy for further evaluation. Risks and benefits of procedure was discussed with the patient  including, but not limited to bleeding, infection, damage to adjacent structures or low yield requiring additional tests.  All of the questions were answered and there is agreement to proceed.  Consent signed and in chart.     Electronically Signed: D. Rowe Robert, PA-C 05/05/2019, 8:22 AM   I spent a total of 20 minutes at the the patient's bedside AND on the patient's hospital floor or unit, greater than 50% of which was counseling/coordinating  care for CT-guided bone marrow biopsy

## 2019-05-05 NOTE — Discharge Instructions (Signed)
Moderate Conscious Sedation, Adult, Care After °These instructions provide you with information about caring for yourself after your procedure. Your health care provider may also give you more specific instructions. Your treatment has been planned according to current medical practices, but problems sometimes occur. Call your health care provider if you have any problems or questions after your procedure. °What can I expect after the procedure? °After your procedure, it is common: °· To feel sleepy for several hours. °· To feel clumsy and have poor balance for several hours. °· To have poor judgment for several hours. °· To vomit if you eat too soon. °Follow these instructions at home: °For at least 24 hours after the procedure: ° °· Do not: °? Participate in activities where you could fall or become injured. °? Drive. °? Use heavy machinery. °? Drink alcohol. °? Take sleeping pills or medicines that cause drowsiness. °? Make important decisions or sign legal documents. °? Take care of children on your own. °· Rest. °Eating and drinking °· Follow the diet recommended by your health care provider. °· If you vomit: °? Drink water, juice, or soup when you can drink without vomiting. °? Make sure you have little or no nausea before eating solid foods. °General instructions °· Have a responsible adult stay with you until you are awake and alert. °· Take over-the-counter and prescription medicines only as told by your health care provider. °· If you smoke, do not smoke without supervision. °· Keep all follow-up visits as told by your health care provider. This is important. °Contact a health care provider if: °· You keep feeling nauseous or you keep vomiting. °· You feel light-headed. °· You develop a rash. °· You have a fever. °Get help right away if: °· You have trouble breathing. °This information is not intended to replace advice given to you by your health care provider. Make sure you discuss any questions you have  with your health care provider. °Document Released: 09/29/2013 Document Revised: 05/13/2016 Document Reviewed: 03/30/2016 °Elsevier Interactive Patient Education © 2019 [employer] °Bone Marrow Aspiration and Bone Marrow Biopsy, Adult, Care After °This sheet gives you information about how to care for yourself after your procedure. Your health care provider may also give you more specific instructions. If you have problems or questions, contact your health care provider. °What can I expect after the procedure? °After the procedure, it is common to have: °· Mild pain and tenderness. °· Swelling. °· Bruising. °Follow these instructions at home: °Puncture site care ° °  ° °· Follow instructions from your health care provider about how to take care of the puncture site. Make sure you: °? Wash your hands with soap and water before you change your bandage (dressing). If soap and water are not available, use hand sanitizer. °? Change your dressing as told by your health care provider. °· Check your puncture site every day for signs of infection. Check for: °? More redness, swelling, or pain. °? More fluid or blood. °? Warmth. °? Pus or a bad smell. °General instructions °· Take over-the-counter and prescription medicines only as told by your health care provider. °· Do not take baths, swim, or use a hot tub until your health care provider approves. Ask if you can take a shower or have a sponge bath. °· Return to your normal activities as told by your health care provider. Ask your health care provider what activities are safe for you. °· Do not drive for 24 hours if you were given   a medicine to help you relax (sedative) during your procedure. °· Keep all follow-up visits as told by your health care provider. This is important. °Contact a health care provider if: °· Your pain is not controlled with medicine. °Get help right away if: °· You have a fever. °· You have more redness, swelling, or pain around the puncture  site. °· You have more fluid or blood coming from the puncture site. °· Your puncture site feels warm to the touch. °· You have pus or a bad smell coming from the puncture site. °These symptoms may represent a serious problem that is an emergency. Do not wait to see if the symptoms will go away. Get medical help right away. Call your local emergency services (911 in the [place] Do not drive yourself to the hospital. °Summary °· After the procedure, it is common to have mild pain, tenderness, swelling, and bruising. °· Follow instructions from your health care provider about how to take care of the puncture site. °· Get help right away if you have any symptoms of infection or if you have more blood or fluid coming from the puncture site. °This information is not intended to replace advice given to you by your health care provider. Make sure you discuss any questions you have with your health care provider. °Document Released: 06/28/2005 Document Revised: 03/24/2018 Document Reviewed: 05/22/2016 °Elsevier Interactive Patient Education © 2019 [employer] ° °

## 2019-05-05 NOTE — Procedures (Signed)
Interventional Radiology Procedure:   Indications:  History of diffuse large B-cell lymphoma with prior chemotherapy and now with follicular lymphoma.  He also has persistent neutropenia   Procedure: CT guided bone marrow biopsy  Findings: 2 aspirates and 1 core from right ilium  Complications: None     EBL: Minimal, less than 10 ml  Plan: Discharge to home in one hour.   Gwenette Wellons R. Anselm Pancoast, MD  Pager: [phone]

## 2019-05-07 ENCOUNTER — Encounter: Payer: Self-pay | Admitting: Hematology

## 2019-05-11 ENCOUNTER — Ambulatory Visit (HOSPITAL_COMMUNITY)
Admission: RE | Admit: 2019-05-11 | Discharge: 2019-05-11 | Disposition: A | Discharge status: Home / Self Care | Payer: No Typology Code available for payment source | Source: Ambulatory Visit | Attending: Hematology | Admitting: Hematology

## 2019-05-11 ENCOUNTER — Other Ambulatory Visit: Payer: Self-pay

## 2019-05-11 DIAGNOSIS — C8298 Follicular lymphoma, unspecified, lymph nodes of multiple sites: Secondary | ICD-10-CM | POA: Insufficient documentation

## 2019-05-11 DIAGNOSIS — D702 Other drug-induced agranulocytosis: Secondary | ICD-10-CM | POA: Insufficient documentation

## 2019-05-11 LAB — GLUCOSE, CAPILLARY: Glucose-Capillary: 92 mg/dL (ref 70–99)

## 2019-05-11 MED ORDER — FLUDEOXYGLUCOSE F - 18 (FDG) INJECTION
11.0000 | Freq: Once | INTRAVENOUS | Status: AC | PRN
  Administered 2019-05-11: 07:00:00 11 via INTRAVENOUS

## 2019-05-12 ENCOUNTER — Telehealth: Payer: Self-pay | Admitting: *Deleted

## 2019-05-12 ENCOUNTER — Encounter (HOSPITAL_COMMUNITY): Payer: Self-pay | Admitting: Hematology

## 2019-05-12 NOTE — Telephone Encounter (Signed)
Patient called to ask if MD had any results back on his bone marrow biopsy. Per Dr. Kale, inform patient that bone marrow does not show involvement by lymphoma. He will discuss further details at patient  appt on 5/28. Contacted patient with information provided by Dr. Kale. Patient verbalized understanding. 

## 2019-05-13 ENCOUNTER — Other Ambulatory Visit: Payer: Self-pay

## 2019-05-13 ENCOUNTER — Inpatient Hospital Stay: Payer: No Typology Code available for payment source

## 2019-05-13 DIAGNOSIS — C8298 Follicular lymphoma, unspecified, lymph nodes of multiple sites: Secondary | ICD-10-CM

## 2019-05-13 DIAGNOSIS — D702 Other drug-induced agranulocytosis: Secondary | ICD-10-CM

## 2019-05-13 DIAGNOSIS — Z5112 Encounter for antineoplastic immunotherapy: Secondary | ICD-10-CM | POA: Diagnosis not present

## 2019-05-13 LAB — CMP (CANCER CENTER ONLY)
ALT: 33 U/L (ref 0–44)
AST: 26 U/L (ref 15–41)
Albumin: 4 g/dL (ref 3.5–5.0)
Alkaline Phosphatase: 89 U/L (ref 38–126)
Anion gap: 8 (ref 5–15)
BUN: 17 mg/dL (ref 8–23)
CO2: 26 mmol/L (ref 22–32)
Calcium: 9.4 mg/dL (ref 8.9–10.3)
Chloride: 107 mmol/L (ref 98–111)
Creatinine: 0.99 mg/dL (ref 0.61–1.24)
GFR, Est AFR Am: >60 mL/min (ref >60)
GFR, Estimated: >60 mL/min (ref >60)
Glucose, Bld: 102 mg/dL — ABNORMAL HIGH (ref 70–99)
Potassium: 4.2 mmol/L (ref 3.5–5.1)
Sodium: 141 mmol/L (ref 135–145)
Total Bilirubin: 0.5 mg/dL (ref 0.3–1.2)
Total Protein: 6.6 g/dL (ref 6.5–8.1)

## 2019-05-13 LAB — CBC WITH DIFFERENTIAL/PLATELET
Abs Immature Granulocytes: 0.01 10*3/uL (ref 0.00–0.07)
Basophils Absolute: 0 10*3/uL (ref 0.0–0.1)
Basophils Relative: 1 %
Eosinophils Absolute: 0 10*3/uL (ref 0.0–0.5)
Eosinophils Relative: 1 %
HCT: 43.9 % (ref 39.0–52.0)
Hemoglobin: 15 g/dL (ref 13.0–17.0)
Immature Granulocytes: 0 %
Lymphocytes Relative: 29 %
Lymphs Abs: 1.1 10*3/uL (ref 0.7–4.0)
MCH: 29.9 pg (ref 26.0–34.0)
MCHC: 34.2 g/dL (ref 30.0–36.0)
MCV: 87.5 fL (ref 80.0–100.0)
Monocytes Absolute: 0.4 10*3/uL (ref 0.1–1.0)
Monocytes Relative: 11 %
Neutro Abs: 2.2 10*3/uL (ref 1.7–7.7)
Neutrophils Relative %: 58 %
Platelets: 122 10*3/uL — ABNORMAL LOW (ref 150–400)
RBC: 5.02 MIL/uL (ref 4.22–5.81)
RDW: 14.1 % (ref 11.5–15.5)
WBC: 3.8 10*3/uL — ABNORMAL LOW (ref 4.0–10.5)
nRBC: 0 % (ref 0.0–0.2)

## 2019-05-13 LAB — LACTATE DEHYDROGENASE: LDH: 203 U/L — ABNORMAL HIGH (ref 98–192)

## 2019-05-19 NOTE — Progress Notes (Signed)
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 Cytogenetics and Fish showed BCL 2 positivity but negative for BCL 6 and cMYC CD10 positivity suggestive of possibly GCB subtype.  Patient received first cycle of R CHOP on 08/08/2015 and intrathecal methotrexate with hydrocortisone on 08/09/2015. Patient subsequently was admitted with ARDS likely due to neulasta -treated with high dose steroids and empirically with broad spectrum antibiotics.  2nd cycle of chemotherapy delayed to allow for rehabilitation (was due on 08/31/2015).  Received R-mini-CHOP for cycle 2 and tolerated it without significant cytopenias. Was admitted briefly for a mild lower extremity cellulitis and DVT. Cellulitis now resolved. Patient completing his oral antibiotics.  PET/CT scan on 09/29/2015 with no evidence of disease progression. Good response in his mesenteric lymph nodes. Inguinal  lymph nodes on the right side. Decreased uptake in his C5 level.  No new lesions noted.   Cycle 3 (10/02/2015)  of R- CHOP ( we increased the doxorubicin dose back to 50 mg/m and keep the cyclophosphamide dose reduction at 400 mg/m]  Cycle 4 (10/23/2015) of R-CHOP ( we increased the doxorubicin dose back to 50 mg/m and kept the cyclophosphamide dose reduction at 400 mg/m].  PET/CT 11/10/2015: Shows improvement in most lesions but is noted to have a new FDG avid lymph node in the mesentery of the small bowel.  Cycle 5 on 11/13/2015 R CHOP - received full dose.  Cycle 6 on 12/11/2015 R-CHOP full dose.  Admitted with neutropenic fevers and treatment for possible early pneumonia.  This resolved and he was able to discharge home  01/09/2016 through 01/22/2016: The patient was treated to the C4-C6 levels of the spine to a dose of 30 gray in 10 fractions using a 2 field technique. The patient was also treated to an abdominal lymph node region to a dose of 30 gray in 10 fractions using a 3 field technique. This treatment consisted of a 3-D conformal technique with daily image guidance utilized.   Repeat PET CT scan in March 12 2016 -showed some enlarged mesenteric lymph nodes which were FDG avid.  CT guided FNA of mesenteric lymph nodes at [facility] on 03/29/2016 - showed scant cellularity. No overt evidence of lymphoma.  Rpt PET/CT 05/15/2016 at [facility]- show similar appearing enlarged mesenteric lymph nodes which are FDG avid. No change in size and no other evidence of disease progression.  PET/CT 07/24/2016 - and [facility] shows no evidence of disease progression  PET/CT 01/22/2017:  Result Impression   1.Stable low-level metabolic activity in small lymph nodes in the small bowel mesentery. No new FDG avid disease. 2.Persistent hypermetabolic soft tissue thickening overlying the coccyx which could relate to a decubitus ulcer and should be  amenable to direct inspection. 3.Stable pulmonary nodules which do not appear hypermetabolic, however, several are below the size resolution of PET in size. Continued attention on follow-up is suggested. 4.Previously seen hypermetabolic skin thickening near the right ear has resolved. 5.Ancillary CT findings as above.    INTERVAL HISTORY:   Mr. Ducksworth is here for management and evaluation of [name], grade 1-2. The patient's last visit with [place] was on 04/29/19. The pt reports that he is doing well overall.  The pt reports that he has not developed any new concerns in the interim. He notes that he has not developed concerns for infections and has been sheltering in place given the pandemic. The pt endorses good energy levels, is eating well and is staying active around his house. He denies any continued mouth sores, noting these have resolved since last time.  Of note since the patient's last visit, pt has had a PET/CT completed on 05/11/19 with results revealing "Near complete metabolic response to therapy of lymphoma since the prior PET of 11/12/2018. Low-level hypermetabolism at the site of adenopathy within the abdominal retroperitoneum persists. (Deauville) 2. There has also been resolution of abdominopelvic adenopathy since the diagnostic CT of 01/26/2019. 2. Nonspecific right lower lobe pulmonary nodules, similar. 3. Coronary artery atherosclerosis. Aortic Atherosclerosis."  Lab results (05/13/19) of CBC w/diff and CMP is as follows: all values are WNL except for WBC at 3.8k, PLT at 122k, Glucose at 102. 05/13/19 LDH at 203  On review of systems, pt reports good energy levels, eating well, and denies concerns for infections, mouth sores, back pain, leg swelling, and any other symptoms.    REVIEW OF SYSTEMS:    A 10+ POINT REVIEW OF SYSTEMS WAS OBTAINED including neurology, dermatology, psychiatry, cardiac, respiratory, lymph, extremities, GI, GU,  Musculoskeletal, constitutional, breasts, reproductive, HEENT.  All pertinent positives are noted in the HPI.  All others are negative.   ALLERGIES:  is allergic to neulasta [pegfilgrastim].  MEDICATIONS:  Current Outpatient Medications  Medication Sig Dispense Refill  . diazepam (VALIUM) 2 MG tablet Take 2 mg by mouth daily as needed for anxiety.    [name] Kitchen HYDROcodone-acetaminophen (NORCO) 5-325 MG tablet Take 1-2 tablets by mouth every 6 (six) hours as needed for moderate pain or severe pain. (Patient not taking: Reported on 12/17/2018) 20 tablet 0  . levothyroxine (SYNTHROID, LEVOTHROID) 88 MCG tablet Take 88 mcg by mouth daily before breakfast.   2  . lidocaine-prilocaine (EMLA) cream Apply 1 application topically as needed (for port access).    . Multiple Vitamin (MULTIVITAMIN WITH MINERALS) TABS tablet Take 1 tablet by mouth daily.     . ondansetron (ZOFRAN) 8 MG tablet Take 1 tablet (8 mg total) by mouth every 8 (eight) hours as needed for nausea or vomiting. 30 tablet 1  . prochlorperazine (COMPAZINE) 10 MG tablet Take 1 tablet (10 mg total) by mouth every 8 (eight) hours as needed for nausea or vomiting. 30 tablet 0  . rivaroxaban (XARELTO) 10 MG TABS tablet Take 1 tablet (10 mg total) by mouth daily. 30 tablet 5  . senna (SENOKOT) 8.6 MG TABS tablet Take 2 tablets by mouth at bedtime.    . simvastatin (ZOCOR) 20 MG tablet Take 20 mg by mouth  daily.    . temazepam (RESTORIL) 15 MG capsule Take 15 mg by mouth at bedtime as needed for sleep.      No current facility-administered medications for this visit.     PHYSICAL EXAMINATION: ECOG PERFORMANCE STATUS: 1 - Symptomatic but completely ambulatory  Vitals:   05/20/19 0940  BP: (!) 149/89  Pulse: 77  Resp: 18  Temp: 98.3 F (36.8 C)  SpO2: 99%   Filed Weights   05/20/19 0940  Weight: 225 lb (102.1 kg)    GENERAL:alert, in no acute distress and comfortable SKIN: no acute rashes, no significant lesions EYES: conjunctiva are  pink and non-injected, sclera anicteric OROPHARYNX: MMM, no exudates, no oropharyngeal erythema or ulceration NECK: supple, no JVD LYMPH: Minimally palpable right inguinal lymph node about 1.5cm, no palpable lymphadenopathy in the cervical or axillary regions LUNGS: clear to auscultation b/l with normal respiratory effort HEART: regular rate & rhythm ABDOMEN:  normoactive bowel sounds , non tender, not distended. No palpable hepatosplenomegaly.  Extremity: no pedal edema PSYCH: alert & oriented x 3 with fluent speech NEURO: no focal motor/sensory deficits   LABORATORY DATA:   CBC Latest Ref Rng & Units 05/13/2019 05/05/2019 04/29/2019  WBC 4.0 - 10.5 K/uL 3.8(L) 2.3(L) 2.2(L)  Hemoglobin 13.0 - 17.0 g/dL 15.0 14.8 14.2  Hematocrit 39.0 - 52.0 % 43.9 44.6 42.0  Platelets 150 - 400 K/uL 122(L) 145(L) 123(L)     CMP Latest Ref Rng & Units 05/13/2019 04/29/2019 04/08/2019  Glucose 70 - 99 mg/dL 102(H) 101(H) 100(H)  BUN 8 - 23 mg/dL '17 19 17  ' Creatinine 0.61 - 1.24 mg/dL 0.99 0.95 1.02  Sodium 135 - 145 mmol/L 141 142 141  Potassium 3.5 - 5.1 mmol/L 4.2 4.1 4.1  Chloride 98 - 111 mmol/L 107 108 108  CO2 22 - 32 mmol/L '26 27 24  ' Calcium 8.9 - 10.3 mg/dL 9.4 8.1(L) 8.7(L)  Total Protein 6.5 - 8.1 g/dL 6.6 6.5 -  Total Bilirubin 0.3 - 1.2 mg/dL 0.5 0.4 -  Alkaline Phos 38 - 126 U/L 89 88 -  AST 15 - 41 U/L 26 28 -  ALT 0 - 44 U/L 33 32 -   . Lab Results  Component Value Date   LDH 203 (H) 05/13/2019    Fine Needle Aspiration, Lymph NodeResulted: 12/04/2018 1:14 PM [facility] Result Narrative  ACCESSION NUMBER: [id number] RECEIVED: 11/27/2018 ORDERING PHYSICIAN: RAKHEE Megan Mans , MD PATIENT NAME: Docia Barrier LYN CYTOLOGY REPORT * Amended * Final Cytologic Interpretation AMENDED REPORT  AMENDED 12/04/2018 TO ADD DIAGNOSIS   A. Right inguinal lymph node, Fine Needle Aspiration II (smears and cell block): B-cell lymphoma with germinal center  phenotype (see comment)  Specimen Adequacy: Satisfactory for evaluation.  B. Cytology core biopsy: B-cell lymphoma with germinal center phenotype (see comment)  Specimen Adequacy: Satisfactory for evaluation.    COMMENT:Sections of the core biopsy demonstrate an atypical vaguely nodular lymphoid infiltrate composed of medium sized lymphoid cells with mature chromatin, irregular nuclear membranes and scant cytoplasm. Focally some areas show larger cells. Few mitotic figures are present. Appropriately controlled immunohistochemical stains are performed. Sheets of B-cells are highlighted by CD20. These B-cells co-express BCL2 and BCL6 (focal, nodular distribution). They are negative for CD30 and MYC. CD21 demonstrates few residual disrupted follicular dendritic meshworks. The proliferation index is low (10-20%), as demonstrated by Ki-67. Scattered small T-cells are highlighted by CD3. Concurrent flow cytometry identified a kappa light chain restricted B-cell population, co-expressing CD10.  Overall,  the findings are consistent with involvement by a B-cell lymphoma with a germinal center phenotype (GC-type), with focal follicular architecture and a low proliferation index. Given the nature of the sample (i.e. core biopsy) a final architectural subclassification is not possible. If clinically feasible, an excisional biopsy is recommended.  Correlation with clinical and cytogenetic/molecular data is recommended.    12/11/18 Inguinal LN Biopsy  Interpretation Tissue-Flow Cytometry - MONOCLONAL B-CELL POPULATION EXPRESSING CD10 COMPRISES 92% OF ALL LYMPHOCYTES - SEE COMMENT Microscopic Gated population: Flow cytometric immunophenotyping is performed using antibiodies to the antigens listed in the table below. Electronic gates are placed around a cell cluster displaying light scatter properties corresponding to lymphocytes. - Abnormal Cells in gated population:  92 % - Phenotype of Abnormal Cells: CD10, CD19, CD20, CD21, CD22, HLA-Dr, Kappa Diagnosis Lymph node for lymphoma, Deep Right Inguinal - FOLLICULAR LYMPHOMA, GRADE 1-2 - SEE COMMENT Microscopic Comment The nodal architecture is effaced by a proliferation back to back follicles composed of medium to large lymphocytes with irregular, cleaved nuclei and scattered centroblasts. There are no diffuse areas of growth present within the examined nodal material. Immunohistochemistry performed on the tissue highlights a nodular B-cell proliferation with expression of CD20, CD10, bcl-6, and Bcl-2. CD21 highlights expanded follicular dendritic meshworks. The Ki-67 shows a slightly increased proliferation rate of 30-40%. The atypical lymphocytes are negative for CD5. CD3 immunohistochemistry highlights the background T-cells. Flow cytometry was performed. A monoclonal B-cell populaton expressing CD10 comprises 92% of all lymphocytes (See [phone]). Overall, the findings are consistent with a diagnosis of low grade follicular lymphoma, Grade 1-2. Of note, the increased ki-67 may portend a more aggressive course despite the low histologic grade.   01/18/19 BM Biopsy:   05/05/19 BM Biopsy:     RADIOGRAPHIC STUDIES: I have personally reviewed the radiological images as listed and agreed with the findings in the report. No results found.    ASSESSMENT & PLAN:   1) Diffuse large B-cell lymphoma stage IV AE with involvement of mesenteric, inguinal lymph nodes and C5 vertebral body with spinal cord impingement  Patient is status post six cycles of R-CHOP and R-mini-CHOP, completed in 2016, as noted above.  2) History of capillary leak syndrome with ARDS requiring intubation likely due to G-CSF.  3) Newly diagnosed Follicular Lymphoma, Grade 1-2   11/02/18 CT A/P which revealed Negative for inguinal or bowel hernia but positive for bulky right inguinal and external iliac lymphadenopathy which  is new since a 2017 PET-CT and highly suspicious for recurrent Lymphoma. This would be amenable to Ultrasound-guided needle biopsy. 2. No abdominal or contralateral left hemi pelvic lymphadenopathy. No other acute findings identified in the abdomen or pelvis. 3. Chronic pneumobilia.  Aortic Atherosclerosis.  11/12/18 PET/CT which revealed  Interval development of multistation lymphadenopathy in the abdomen and pelvis as detailed above- Deauville 5. 2. Presence of pneumobilia could reflect sphincter of Oddi dysfunction versus recent procedure. Correlate with clinical history and upcoming comprehensive metabolic panel. 3. Ancillary CT findings   11/27/18 US guided LN Biopsy pathology report is as noted above. " B-cell lymphoma with a germinal center phenotype (GC-type), with focal follicular architecture and a low proliferation index. Given the nature of the sample (i.e. core biopsy) a final architectural subclassification is not possible. If clinically feasible, an excisional biopsy is recommended."  12/11/18 Right Inguinal LN biopsy revealed Grade 1-2 Follicular Lymphoma  05/29/66 CT A/P revealed Today's study demonstrates clear progression of disease with increased number and size of numerous enlarged lymph nodes  throughout the lower thorax, abdomen and pelvis, as detailed above. 2. In addition, there are several small pulmonary nodules in the lung bases. The largest of these are clustered in the posterior aspect of the right lower lobe in an apparent area of scarring which is very similar to prior PET-CT 03/12/2016, favored to be benign. However, the smaller nodules which measure up to 6 mm in size are nonspecific and warrant attention on follow-up studies. 3. Aortic atherosclerosis, in addition to least 2 vessel coronary artery disease. Assessment for potential risk factor modification, dietary therapy or pharmacologic therapy may be warranted, if clinically indicated. 4. Additional incidental  findings, as above. 01/18/19 BM Bx revealed normocellular bone marrow with trilineage hematopoiesis Stage II indicated with 01/26/19 CT  Began C1 of 78m Revlimid and Rituxan on 02/05/19  4) History of right lower extremity distal DVT. Resolved. S/p 6 months of treatment in 02/2016 -Returning to 152mXarelto while taking Revlimid  5) Mild chronic thrombocytopenia- resolved 03/05/19 PLT at 175k  PLAN:  -Discussed pt labwork 05/13/19; WBC improved to 3.8k with ANC normalized to 2.2k, other blood counts and chemistries are stable. LDH improved to 203 -Discussed the 05/11/19 PET/CT which revealed "Near complete metabolic response to therapy of lymphoma since the prior PET of 11/12/2018. Low-level hypermetabolism at the site of adenopathy within the abdominal retroperitoneum persists. (Deauville) 2. There has also been resolution of abdominopelvic adenopathy since the diagnostic CT of 01/26/2019. 2. Nonspecific right lower lobe pulmonary nodules, similar. 3. Coronary artery atherosclerosis. Aortic Atherosclerosis." -Discussed the 05/05/19 BM Bx which revealed normocellular bone marrow with erythroid hyperplasia, and no evidence of lymphoma -Previous neutropenia from body's response to lymphoma vs Rituxan effect vs Revlimid effect ? -Recommend adding Revlimid back on at lower dose of 1024ms pt has responded well to treatment, and lower dose to mitigate possible neutropenia. Pt agrees with this and continuing monthly Rituxan. -Previously discussed the risks vs benefits of continuing with his seventh Rituxan infusion on 04/29/19, which pt preferred to receive -Recommend salt and baking soda mouthwashes 4 times a day -Continue 44m22mrelto for VTE prophylaxis for now, while still making final decisions about Revlimid -The pt tolerated 4 weekly cycles of Rituxan well, and is now pursuing monthly Rituxan infusions for planned 6 months -Planning for Revlimid 10-12 cycles -Recommend Zofran for nausea, Compazine as  needed -Recommend Claritin or Zyrtec for mild itching, in addition to lotion application and avoid long, hot showers -Port flush every 6 weeks -Begin Vitamin B complex -Recommended that the pt continue to eat well, drink at least 48-64 oz of water each day, and walk 20-30 minutes each day. -Will see the pt back in 3 weeks   Please schedule next 2 doses of Rituxan as per orders (next dose 05/27/2019) Labs and MD visit in 3 weeks for toxicity check   The total time spent in the appt was 25 minutes and more than 50% was on counseling and direct patient cares.   GautSullivan LoneMS Hematology/Oncology Physician [facility]):       [phone] cell):  [phone]):           [phone] SchuBaldwin [place] acting as a scribe for Dr. GautSullivan Lone.I have reviewed the above documentation for accuracy and completeness, and I agree with the above. .GauBrunetta Genera

## 2019-05-20 ENCOUNTER — Other Ambulatory Visit: Payer: Self-pay

## 2019-05-20 ENCOUNTER — Telehealth: Payer: Self-pay | Admitting: Hematology

## 2019-05-20 ENCOUNTER — Inpatient Hospital Stay (HOSPITAL_BASED_OUTPATIENT_CLINIC_OR_DEPARTMENT_OTHER): Payer: No Typology Code available for payment source | Admitting: Hematology

## 2019-05-20 VITALS — BP 149/89 | HR 77 | Temp 98.3°F | Resp 18 | Ht 73.0 in | Wt 225.0 lb

## 2019-05-20 DIAGNOSIS — D702 Other drug-induced agranulocytosis: Secondary | ICD-10-CM | POA: Diagnosis not present

## 2019-05-20 DIAGNOSIS — Z85828 Personal history of other malignant neoplasm of skin: Secondary | ICD-10-CM | POA: Diagnosis not present

## 2019-05-20 DIAGNOSIS — Z7901 Long term (current) use of anticoagulants: Secondary | ICD-10-CM

## 2019-05-20 DIAGNOSIS — Z79899 Other long term (current) drug therapy: Secondary | ICD-10-CM

## 2019-05-20 DIAGNOSIS — Z86718 Personal history of other venous thrombosis and embolism: Secondary | ICD-10-CM

## 2019-05-20 DIAGNOSIS — Z5112 Encounter for antineoplastic immunotherapy: Secondary | ICD-10-CM | POA: Diagnosis not present

## 2019-05-20 DIAGNOSIS — C8298 Follicular lymphoma, unspecified, lymph nodes of multiple sites: Secondary | ICD-10-CM

## 2019-05-20 NOTE — Telephone Encounter (Signed)
Scheduled appt per 5/28 los. °

## 2019-05-21 ENCOUNTER — Encounter: Payer: Self-pay | Admitting: Hematology

## 2019-05-24 MED ORDER — LENALIDOMIDE 10 MG PO CAPS: 10.0000 mg | ORAL_CAPSULE | Freq: Every day | ORAL | 2 refills | Status: DC

## 2019-05-25 ENCOUNTER — Telehealth: Payer: Self-pay | Admitting: Hematology

## 2019-05-25 ENCOUNTER — Telehealth: Payer: Self-pay | Admitting: *Deleted

## 2019-05-25 NOTE — Telephone Encounter (Signed)
Called patient per sch msg due to patients request to reschedule 7/30 appt. Spoke with patient, confirmed new date and time

## 2019-05-25 NOTE — Telephone Encounter (Signed)
Revlimid prescription and requested documentation ([place] form and med list) faxed to [facility] in [place], [place]. "Ethan Stewart" [place] [phone] with [employer] authorization number [id number] (05/24/2019) included.  Fax confirmation received.

## 2019-05-27 ENCOUNTER — Other Ambulatory Visit: Payer: Self-pay | Admitting: *Deleted

## 2019-05-27 DIAGNOSIS — C8298 Follicular lymphoma, unspecified, lymph nodes of multiple sites: Secondary | ICD-10-CM

## 2019-05-28 ENCOUNTER — Other Ambulatory Visit: Payer: Self-pay

## 2019-05-28 ENCOUNTER — Inpatient Hospital Stay: Payer: No Typology Code available for payment source

## 2019-05-28 ENCOUNTER — Inpatient Hospital Stay: Payer: No Typology Code available for payment source | Attending: Hematology

## 2019-05-28 VITALS — BP 126/65 | HR 65 | Temp 97.9°F | Resp 18 | Wt 226.2 lb

## 2019-05-28 DIAGNOSIS — C8298 Follicular lymphoma, unspecified, lymph nodes of multiple sites: Secondary | ICD-10-CM | POA: Insufficient documentation

## 2019-05-28 DIAGNOSIS — C8330 Diffuse large B-cell lymphoma, unspecified site: Secondary | ICD-10-CM

## 2019-05-28 DIAGNOSIS — Z5112 Encounter for antineoplastic immunotherapy: Secondary | ICD-10-CM | POA: Insufficient documentation

## 2019-05-28 DIAGNOSIS — Z95828 Presence of other vascular implants and grafts: Secondary | ICD-10-CM

## 2019-05-28 LAB — CMP (CANCER CENTER ONLY)
ALT: 27 U/L (ref 0–44)
AST: 25 U/L (ref 15–41)
Albumin: 4 g/dL (ref 3.5–5.0)
Alkaline Phosphatase: 88 U/L (ref 38–126)
Anion gap: 8 (ref 5–15)
BUN: 19 mg/dL (ref 8–23)
CO2: 25 mmol/L (ref 22–32)
Calcium: 8.7 mg/dL — ABNORMAL LOW (ref 8.9–10.3)
Chloride: 108 mmol/L (ref 98–111)
Creatinine: 1.05 mg/dL (ref 0.61–1.24)
GFR, Est AFR Am: >60 mL/min (ref >60)
GFR, Estimated: >60 mL/min (ref >60)
Glucose, Bld: 83 mg/dL (ref 70–99)
Potassium: 4.2 mmol/L (ref 3.5–5.1)
Sodium: 141 mmol/L (ref 135–145)
Total Bilirubin: 0.7 mg/dL (ref 0.3–1.2)
Total Protein: 6.2 g/dL — ABNORMAL LOW (ref 6.5–8.1)

## 2019-05-28 LAB — CBC WITH DIFFERENTIAL (CANCER CENTER ONLY)
Abs Immature Granulocytes: 0.01 10*3/uL (ref 0.00–0.07)
Basophils Absolute: 0 10*3/uL (ref 0.0–0.1)
Basophils Relative: 1 %
Eosinophils Absolute: 0.1 10*3/uL (ref 0.0–0.5)
Eosinophils Relative: 3 %
HCT: 41.2 % (ref 39.0–52.0)
Hemoglobin: 13.9 g/dL (ref 13.0–17.0)
Immature Granulocytes: 0 %
Lymphocytes Relative: 20 %
Lymphs Abs: 0.9 10*3/uL (ref 0.7–4.0)
MCH: 30 pg (ref 26.0–34.0)
MCHC: 33.7 g/dL (ref 30.0–36.0)
MCV: 88.8 fL (ref 80.0–100.0)
Monocytes Absolute: 0.7 10*3/uL (ref 0.1–1.0)
Monocytes Relative: 16 %
Neutro Abs: 2.7 10*3/uL (ref 1.7–7.7)
Neutrophils Relative %: 60 %
Platelet Count: 123 10*3/uL — ABNORMAL LOW (ref 150–400)
RBC: 4.64 MIL/uL (ref 4.22–5.81)
RDW: 14.5 % (ref 11.5–15.5)
WBC Count: 4.4 10*3/uL (ref 4.0–10.5)
nRBC: 0 % (ref 0.0–0.2)

## 2019-05-28 LAB — LACTATE DEHYDROGENASE: LDH: 188 U/L (ref 98–192)

## 2019-05-28 MED ORDER — DIPHENHYDRAMINE HCL 25 MG PO CAPS
50.0000 mg | ORAL_CAPSULE | Freq: Once | ORAL | Status: AC
  Administered 2019-05-28: 50 mg via ORAL

## 2019-05-28 MED ORDER — SODIUM CHLORIDE 0.9% FLUSH
10.0000 mL | INTRAVENOUS | Status: DC | PRN
  Administered 2019-05-28: 15:00:00 10 mL
  Filled 2019-05-28: qty 10

## 2019-05-28 MED ORDER — DIPHENHYDRAMINE HCL 25 MG PO CAPS
ORAL_CAPSULE | ORAL | Status: AC
  Filled 2019-05-28: qty 2

## 2019-05-28 MED ORDER — METHYLPREDNISOLONE SODIUM SUCC 125 MG IJ SOLR
125.0000 mg | Freq: Once | INTRAMUSCULAR | Status: AC
  Administered 2019-05-28: 125 mg via INTRAVENOUS

## 2019-05-28 MED ORDER — SODIUM CHLORIDE 0.9 % IV SOLN
Freq: Once | INTRAVENOUS | Status: AC
  Administered 2019-05-28: 13:00:00 via INTRAVENOUS
  Filled 2019-05-28: qty 250

## 2019-05-28 MED ORDER — ACETAMINOPHEN 325 MG PO TABS
ORAL_TABLET | ORAL | Status: AC
  Filled 2019-05-28: qty 2

## 2019-05-28 MED ORDER — SODIUM CHLORIDE 0.9% FLUSH
10.0000 mL | Freq: Once | INTRAVENOUS | Status: AC
  Administered 2019-05-28: 10 mL
  Filled 2019-05-28: qty 10

## 2019-05-28 MED ORDER — ACETAMINOPHEN 325 MG PO TABS
650.0000 mg | ORAL_TABLET | Freq: Once | ORAL | Status: AC
  Administered 2019-05-28: 650 mg via ORAL

## 2019-05-28 MED ORDER — SODIUM CHLORIDE 0.9 % IV SOLN
375.0000 mg/m2 | Freq: Once | INTRAVENOUS | Status: AC
  Administered 2019-05-28: 14:00:00 900 mg via INTRAVENOUS
  Filled 2019-05-28: qty 40

## 2019-05-28 MED ORDER — METHYLPREDNISOLONE SODIUM SUCC 125 MG IJ SOLR
INTRAMUSCULAR | Status: AC
  Filled 2019-05-28: qty 2

## 2019-05-28 MED ORDER — HEPARIN SOD (PORK) LOCK FLUSH 100 UNIT/ML IV SOLN
500.0000 [IU] | Freq: Once | INTRAVENOUS | Status: AC | PRN
  Administered 2019-05-28: 500 [IU]
  Filled 2019-05-28: qty 5

## 2019-05-28 NOTE — Patient Instructions (Signed)
[facility] Discharge Instructions for Patients Receiving Chemotherapy  Today you received the following chemotherapy agents:  Rituximab  To help prevent nausea and vomiting after your treatment, we encourage you to take your nausea medication as prescribed   If you develop nausea and vomiting that is not controlled by your nausea medication, call the clinic.   BELOW ARE SYMPTOMS THAT SHOULD BE REPORTED IMMEDIATELY:  *FEVER GREATER THAN 100.5 F  *CHILLS WITH OR WITHOUT FEVER  NAUSEA AND VOMITING THAT IS NOT CONTROLLED WITH YOUR NAUSEA MEDICATION  *UNUSUAL SHORTNESS OF BREATH  *UNUSUAL BRUISING OR BLEEDING  TENDERNESS IN MOUTH AND THROAT WITH OR WITHOUT PRESENCE OF ULCERS  *URINARY PROBLEMS  *BOWEL PROBLEMS  UNUSUAL RASH Items with * indicate a potential emergency and should be followed up as soon as possible.  Feel free to call the clinic should you have any questions or concerns. The clinic phone number is [phone].  Please show the [place] at check-in to the Emergency Department and triage nurse.   COVID-19 Labs  Recent Labs    05/28/19 1118  LDH 188    No results found for: [id number]

## 2019-06-07 ENCOUNTER — Telehealth: Payer: Self-pay | Admitting: *Deleted

## 2019-06-07 MED ORDER — LENALIDOMIDE 10 MG PO CAPS: 10.0000 mg | ORAL_CAPSULE | Freq: Every day | ORAL | 0 refills | Status: DC

## 2019-06-07 NOTE — Telephone Encounter (Signed)
Clarification of  Revlimid prescription - Faxed on 05/24/2019 to [facility] in [place] [phone].  [employer] Auth# [id number] obtained 05/24/2019.

## 2019-06-09 ENCOUNTER — Other Ambulatory Visit: Payer: Self-pay | Admitting: *Deleted

## 2019-06-09 DIAGNOSIS — C8298 Follicular lymphoma, unspecified, lymph nodes of multiple sites: Secondary | ICD-10-CM

## 2019-06-09 NOTE — Progress Notes (Signed)
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 Cytogenetics and Fish showed BCL 2 positivity but negative for BCL 6 and cMYC CD10 positivity suggestive of possibly GCB subtype.  Patient received first cycle of R CHOP on 08/08/2015 and intrathecal methotrexate with hydrocortisone on 08/09/2015. Patient subsequently was admitted with ARDS likely due to neulasta -treated with high dose steroids and empirically with broad spectrum antibiotics.  2nd cycle of chemotherapy delayed to allow for rehabilitation (was due on 08/31/2015).  Received R-mini-CHOP for cycle 2 and tolerated it without significant cytopenias. Was admitted briefly for a mild lower extremity cellulitis and DVT. Cellulitis now resolved. Patient completing his oral antibiotics.  PET/CT scan on 09/29/2015 with no evidence of disease progression. Good response in his mesenteric lymph nodes. Inguinal  lymph nodes on the right side. Decreased uptake in his C5 level.  No new lesions noted.   Cycle 3 (10/02/2015)  of R- CHOP ( we increased the doxorubicin dose back to 50 mg/m and keep the cyclophosphamide dose reduction at 400 mg/m]  Cycle 4 (10/23/2015) of R-CHOP ( we increased the doxorubicin dose back to 50 mg/m and kept the cyclophosphamide dose reduction at 400 mg/m].  PET/CT 11/10/2015: Shows improvement in most lesions but is noted to have a new FDG avid lymph node in the mesentery of the small bowel.  Cycle 5 on 11/13/2015 R CHOP - received full dose.  Cycle 6 on 12/11/2015 R-CHOP full dose.  Admitted with neutropenic fevers and treatment for possible early pneumonia.  This resolved and he was able to discharge home  01/09/2016 through 01/22/2016: The patient was treated to the C4-C6 levels of the spine to a dose of 30 gray in 10 fractions using a 2 field technique. The patient was also treated to an abdominal lymph node region to a dose of 30 gray in 10 fractions using a 3 field technique. This treatment consisted of a 3-D conformal technique with daily image guidance utilized.   Repeat PET CT scan in March 12 2016 -showed some enlarged mesenteric lymph nodes which were FDG avid.  CT guided FNA of mesenteric lymph nodes at [facility] on 03/29/2016 - showed scant cellularity. No overt evidence of lymphoma.  Rpt PET/CT 05/15/2016 at [facility]- show similar appearing enlarged mesenteric lymph nodes which are FDG avid. No change in size and no other evidence of disease progression.  PET/CT 07/24/2016 - and [facility] shows no evidence of disease progression  PET/CT 01/22/2017:  Result Impression   1.Stable low-level metabolic activity in small lymph nodes in the small bowel mesentery. No new FDG avid disease. 2.Persistent hypermetabolic soft tissue thickening overlying the coccyx which could relate to a decubitus ulcer and should be  amenable to direct inspection. 3.Stable pulmonary nodules which do not appear hypermetabolic, however, several are below the size resolution of PET in size. Continued attention on follow-up is suggested. 4.Previously seen hypermetabolic skin thickening near the right ear has resolved. 5.Ancillary CT findings as above.    INTERVAL HISTORY:   Ethan Stewart is here for management and evaluation of [name], grade 1-2. The patient's last visit with [place] was on 05/20/19. The pt reports that he is doing well overall.  The pt reports that he has not developed any new concerns in the interim. He has continued eating well, is staying active, and is enjoying good energy levels. He is tolerating 75m Revlimid well at this time and is in his second week on currently. The pt denies any eye symptoms nor mouth sores.  Lab results today (06/10/19) of CBC w/diff is as follows: all values are WNL except for PLT at 131k. 06/10/19 LDH and CMP are pending  On review of systems, pt reports good appetite, eating well, staying active, good energy levels, and denies concerns for infections, eye symptoms, mouth sores, and any other symptoms.   REVIEW OF SYSTEMS:    A 10+ POINT REVIEW OF SYSTEMS WAS OBTAINED including neurology, dermatology, psychiatry, cardiac, respiratory, lymph, extremities, GI, GU, Musculoskeletal, constitutional, breasts, reproductive, HEENT.  All pertinent positives are noted in the HPI.  All others are negative.   ALLERGIES:  is allergic to neulasta [pegfilgrastim].  MEDICATIONS:  Current Outpatient Medications  Medication Sig Dispense Refill  . diazepam (VALIUM) 2 MG tablet Take 2 mg by mouth daily as needed for anxiety.    .Ethan KitchenHYDROcodone-acetaminophen (NORCO) 5-325 MG tablet Take 1-2 tablets by mouth every 6 (six) hours as needed for moderate pain or severe pain. (Patient not taking: Reported on 12/17/2018) 20 tablet 0  . lenalidomide (REVLIMID) 15 MG  capsule Take 1 capsule (15 mg total) by mouth daily. 3 weeks on 1 week off. 21 capsule 2  . levothyroxine (SYNTHROID, LEVOTHROID) 88 MCG tablet Take 88 mcg by mouth daily before breakfast.   2  . lidocaine-prilocaine (EMLA) cream Apply 1 application topically as needed (for port access).    . Multiple Vitamin (MULTIVITAMIN WITH MINERALS) TABS tablet Take 1 tablet by mouth daily.     . ondansetron (ZOFRAN) 8 MG tablet Take 1 tablet (8 mg total) by mouth every 8 (eight) hours as needed for nausea or vomiting. 30 tablet 1  . prochlorperazine (COMPAZINE) 10 MG tablet Take 1 tablet (10 mg total) by mouth every 8 (eight) hours as needed for nausea or vomiting. 30 tablet 0  . rivaroxaban (XARELTO) 10 MG TABS tablet Take 1 tablet (10 mg total) by mouth daily. 30 tablet 5  . senna (SENOKOT) 8.6 MG TABS tablet Take 2 tablets by mouth at bedtime.    . simvastatin (ZOCOR) 20 MG tablet Take 20 mg by mouth daily.    . temazepam (RESTORIL) 15 MG capsule Take 15 mg by mouth at bedtime as needed for sleep.      No current facility-administered medications for this visit.     PHYSICAL EXAMINATION: ECOG PERFORMANCE STATUS: 1 - Symptomatic but completely ambulatory  Vitals:   06/10/19 1105  BP: (!) 153/73  Pulse: 60  Resp: 18  Temp: 98.2 F (36.8 C)  SpO2: 100%   Filed Weights   06/10/19 1105  Weight: 225 lb 11.2 oz (102.4 kg)    GENERAL:alert, in no acute distress and comfortable SKIN: no acute rashes, no significant lesions EYES: conjunctiva are pink and non-injected, sclera anicteric OROPHARYNX: MMM, no exudates, no oropharyngeal erythema or ulceration NECK: supple, no JVD LYMPH: Minimally palpable right inguinal lymph node about 1.5cm, no palpable lymphadenopathy in the cervical or axillary regions LUNGS: clear to auscultation b/l with normal respiratory effort HEART: regular rate & rhythm ABDOMEN:  normoactive bowel sounds , non tender, not distended. No palpable hepatosplenomegaly.   Extremity: no pedal edema PSYCH: alert & oriented x 3 with fluent speech NEURO: no focal motor/sensory deficits   LABORATORY DATA:   CBC Latest Ref Rng & Units 06/10/2019 05/28/2019 05/13/2019  WBC 4.0 - 10.5 K/uL 4.3 4.4 3.8(L)  Hemoglobin 13.0 - 17.0 g/dL 14.4 13.9 15.0  Hematocrit 39.0 - 52.0 % 43.7 41.2 43.9  Platelets 150 - 400 K/uL 131(L) 123(L) 122(L)     CMP Latest Ref Rng & Units 05/28/2019 05/13/2019 04/29/2019  Glucose 70 - 99 mg/dL 83 102(H) 101(H)  BUN 8 - 23 mg/dL '19 17 19  ' Creatinine 0.61 - 1.24 mg/dL 1.05 0.99 0.95  Sodium 135 - 145 mmol/L 141 141 142  Potassium 3.5 - 5.1 mmol/L 4.2 4.2 4.1  Chloride 98 - 111 mmol/L 108 107 108  CO2 22 - 32 mmol/L '25 26 27  ' Calcium 8.9 - 10.3 mg/dL 8.7(L) 9.4 8.1(L)  Total Protein 6.5 - 8.1 g/dL 6.2(L) 6.6 6.5  Total Bilirubin 0.3 - 1.2 mg/dL 0.7 0.5 0.4  Alkaline Phos 38 - 126 U/L 88 89 88  AST 15 - 41 U/L '25 26 28  ' ALT 0 - 44 U/L 27 33 32   . Lab Results  Component Value Date   LDH 210 (H) 06/10/2019    Fine Needle Aspiration, Lymph NodeResulted: 12/04/2018 1:14 PM [facility] Result Narrative  ACCESSION NUMBER: [id number] RECEIVED: 11/27/2018 ORDERING PHYSICIAN: RAKHEE Megan Mans , MD PATIENT NAME: Docia Barrier LYN CYTOLOGY REPORT * Amended * Final Cytologic Interpretation AMENDED REPORT  AMENDED 12/04/2018 TO ADD DIAGNOSIS   A. Right inguinal lymph node, Fine Needle Aspiration II (smears and cell block): B-cell lymphoma with germinal center phenotype (see comment)  Specimen Adequacy: Satisfactory for evaluation.  B. Cytology core biopsy: B-cell lymphoma with germinal center phenotype (see comment)  Specimen Adequacy: Satisfactory for evaluation.    COMMENT:Sections of the core biopsy demonstrate an atypical vaguely nodular lymphoid infiltrate composed of medium sized lymphoid cells with mature chromatin, irregular nuclear membranes and scant cytoplasm.  Focally some areas show larger cells. Few mitotic figures are present. Appropriately controlled immunohistochemical stains are performed. Sheets of B-cells are highlighted by CD20. These B-cells co-express BCL2 and BCL6 (focal, nodular distribution). They are negative for CD30 and MYC. CD21 demonstrates few residual disrupted follicular dendritic meshworks. The proliferation index is low (10-20%), as demonstrated by Ki-67. Scattered small T-cells are highlighted by CD3. Concurrent flow cytometry identified a kappa light chain restricted B-cell population, co-expressing CD10.  Overall, the findings are consistent with involvement by a B-cell lymphoma with a germinal center phenotype (GC-type), with focal follicular architecture and a low proliferation index. Given the nature of the sample (i.e. core biopsy) a final architectural subclassification is not possible. If clinically feasible, an excisional biopsy is recommended.  Correlation with clinical and cytogenetic/molecular data is recommended.  12/11/18 Inguinal LN Biopsy  Interpretation Tissue-Flow Cytometry - MONOCLONAL B-CELL POPULATION EXPRESSING CD10 COMPRISES 92% OF ALL LYMPHOCYTES - SEE COMMENT Microscopic Gated population: Flow cytometric immunophenotyping is performed using antibiodies to the antigens listed in the table below. Electronic gates are placed around a cell cluster displaying light scatter properties corresponding to lymphocytes. - Abnormal Cells in gated population: 92 % - Phenotype of Abnormal Cells: CD10, CD19, CD20, CD21, CD22, HLA-Dr, Kappa Diagnosis Lymph node for lymphoma, Deep Right Inguinal - FOLLICULAR LYMPHOMA, GRADE 1-2 - SEE COMMENT Microscopic Comment The nodal architecture is effaced by a proliferation back to back follicles composed of medium to large lymphocytes with irregular, cleaved nuclei and scattered centroblasts. There are no diffuse areas of growth present within the examined  nodal material. Immunohistochemistry performed on the tissue highlights a nodular B-cell proliferation with expression of CD20, CD10, bcl-6, and Bcl-2. CD21 highlights expanded follicular dendritic meshworks. The Ki-67 shows a slightly increased proliferation rate of 30-40%. The atypical lymphocytes are negative for CD5. CD3 immunohistochemistry highlights the background T-cells. Flow cytometry was performed. A monoclonal B-cell populaton expressing CD10 comprises 92% of all lymphocytes (See [phone]). Overall, the findings are consistent with a diagnosis of low grade follicular lymphoma, Grade 1-2. Of note, the increased ki-67 may portend a more aggressive course despite the low histologic grade.   01/18/19 BM Biopsy:   05/05/19 BM Biopsy:     RADIOGRAPHIC STUDIES: I have personally reviewed the radiological images as listed and agreed with the findings in the report. No results found.    ASSESSMENT & PLAN:   1) Diffuse large B-cell lymphoma stage IV AE with involvement of mesenteric, inguinal lymph nodes and C5 vertebral body with spinal cord impingement  Patient is status post six cycles of R-CHOP and R-mini-CHOP, completed in 2016, as noted above.  2) History of capillary leak syndrome with ARDS requiring intubation likely due to G-CSF.  3) Newly diagnosed Follicular Lymphoma, Grade 1-2   11/02/18 CT A/P which revealed Negative for inguinal or bowel hernia but positive for bulky right inguinal and external iliac lymphadenopathy which is new since a 2017 PET-CT and highly suspicious for recurrent Lymphoma. This would be amenable to Ultrasound-guided needle biopsy. 2. No abdominal or contralateral left hemi pelvic lymphadenopathy. No other acute findings identified in the abdomen or pelvis. 3. Chronic pneumobilia.  Aortic Atherosclerosis.  11/12/18 PET/CT which revealed  Interval development of multistation lymphadenopathy in the abdomen and pelvis as detailed above- [place]  5. 2. Presence of pneumobilia could reflect sphincter of Oddi dysfunction versus recent procedure. Correlate with clinical history and upcoming comprehensive metabolic panel. 3. Ancillary CT findings   11/27/18 US guided LN Biopsy pathology report is as noted above. " B-cell lymphoma with a germinal center phenotype (GC-type), with focal follicular architecture and a low proliferation index. Given the nature of the sample (i.e. core biopsy) a final architectural subclassification is not possible. If clinically feasible, an excisional biopsy is recommended."  12/11/18 Right Inguinal LN biopsy revealed Grade 1-2 Follicular Lymphoma  9/0/30 CT A/P revealed Today's study demonstrates clear progression of disease with increased number and size of numerous enlarged lymph nodes throughout the lower thorax, abdomen and pelvis, as detailed above. 2. In addition, there are several small pulmonary nodules in the lung bases. The largest of these are clustered in the posterior aspect of the right lower lobe in an apparent area of scarring which is very similar to prior PET-CT 03/12/2016, favored to be benign. However, the smaller nodules which  measure up to 6 mm in size are nonspecific and warrant attention on follow-up studies. 3. Aortic atherosclerosis, in addition to least 2 vessel coronary artery disease. Assessment for potential risk factor modification, dietary therapy or pharmacologic therapy may be warranted, if clinically indicated. 4. Additional incidental findings, as above. 01/18/19 BM Bx revealed normocellular bone marrow with trilineage hematopoiesis Stage II indicated with 01/26/19 CT  Began C1 of 5m Revlimid and Rituxan on 02/05/19  05/05/19 BM Bx revealed normocellular bone marrow with erythroid hyperplasia, and no evidence of lymphoma  05/11/19 PET/CT revealed "Near complete metabolic response to therapy of lymphoma since the prior PET of 11/12/2018. Low-level hypermetabolism at the site of  adenopathy within the abdominal retroperitoneum persists. ([place]) 2. There has also been resolution of abdominopelvic adenopathy since the diagnostic CT of 01/26/2019. 2. Nonspecific right lower lobe pulmonary nodules, similar. 3. Coronary artery atherosclerosis. Aortic Atherosclerosis."   4) History of right lower extremity distal DVT. Resolved. S/p 6 months of treatment in 02/2016 -Returning to 132mXarelto while taking Revlimid  5) Mild chronic thrombocytopenia- resolved 03/05/19 PLT at 175k  PLAN:  -Discussed pt labwork today, 06/10/19; no neutropenia, HGB normal at 14.4, PLT stable at 131k. -The pt has no prohibitive toxicities from continuing monthly Rituxan and 1067mevlimid, three weeks on and one week off, at this time. Will increase to 1m19mvlimid after this cycle. -Previous neutropenia from body's response to lymphoma vs Rituxan effect vs Revlimid effect ? -Recommend salt and baking soda mouthwashes 4 times a day -Continue 10mg16melto for VTE prophylaxis for now, while still making final decisions about Revlimid -The pt tolerated 4 weekly cycles of Rituxan well, and is now pursuing monthly Rituxan infusions for planned 6 months -Planning for Revlimid 10-12 cycles -Recommend Zofran for nausea, Compazine as needed -Recommend Claritin or Zyrtec for mild itching, in addition to lotion application and avoid long, hot showers -Port flush every 6 weeks -Begin Vitamin B complex -Recommended that the pt continue to eat well, drink at least 48-64 oz of water each day, and walk 20-30 minutes each day. -Will see the pt back in 4 weeks   F/u as per scheduled appointments for labs and treatment on 06/24/2019. Can move MD appointment from 06/24/2019 further by 2 weeks with labs   The total time spent in the appt was 25 minutes and more than 50% was on counseling and direct patient cares.   GautaSullivan LoneS Hematology/Oncology Physician [facility]):        [phone] cell):  [phone]           [phone] Jamaicaacting as a scribe for Dr. GautaSullivan LoneI have reviewed the above documentation for accuracy and completeness, and I agree with the above. .GautBrunetta Genera

## 2019-06-10 ENCOUNTER — Other Ambulatory Visit: Payer: Self-pay

## 2019-06-10 ENCOUNTER — Other Ambulatory Visit: Payer: Self-pay | Admitting: *Deleted

## 2019-06-10 ENCOUNTER — Inpatient Hospital Stay (HOSPITAL_BASED_OUTPATIENT_CLINIC_OR_DEPARTMENT_OTHER): Payer: No Typology Code available for payment source | Admitting: Hematology

## 2019-06-10 ENCOUNTER — Inpatient Hospital Stay: Payer: No Typology Code available for payment source

## 2019-06-10 VITALS — BP 153/73 | HR 60 | Temp 98.2°F | Resp 18 | Ht 73.0 in | Wt 225.7 lb

## 2019-06-10 DIAGNOSIS — C8298 Follicular lymphoma, unspecified, lymph nodes of multiple sites: Secondary | ICD-10-CM

## 2019-06-10 DIAGNOSIS — Z7901 Long term (current) use of anticoagulants: Secondary | ICD-10-CM

## 2019-06-10 DIAGNOSIS — Z5112 Encounter for antineoplastic immunotherapy: Secondary | ICD-10-CM | POA: Diagnosis not present

## 2019-06-10 DIAGNOSIS — Z79899 Other long term (current) drug therapy: Secondary | ICD-10-CM | POA: Diagnosis not present

## 2019-06-10 DIAGNOSIS — Z86718 Personal history of other venous thrombosis and embolism: Secondary | ICD-10-CM | POA: Diagnosis not present

## 2019-06-10 DIAGNOSIS — C8330 Diffuse large B-cell lymphoma, unspecified site: Secondary | ICD-10-CM

## 2019-06-10 LAB — CBC WITH DIFFERENTIAL (CANCER CENTER ONLY)
Abs Immature Granulocytes: 0.01 10*3/uL (ref 0.00–0.07)
Basophils Absolute: 0 10*3/uL (ref 0.0–0.1)
Basophils Relative: 1 %
Eosinophils Absolute: 0.1 10*3/uL (ref 0.0–0.5)
Eosinophils Relative: 3 %
HCT: 43.7 % (ref 39.0–52.0)
Hemoglobin: 14.4 g/dL (ref 13.0–17.0)
Immature Granulocytes: 0 %
Lymphocytes Relative: 21 %
Lymphs Abs: 0.9 10*3/uL (ref 0.7–4.0)
MCH: 29.4 pg (ref 26.0–34.0)
MCHC: 33 g/dL (ref 30.0–36.0)
MCV: 89.4 fL (ref 80.0–100.0)
Monocytes Absolute: 0.7 10*3/uL (ref 0.1–1.0)
Monocytes Relative: 16 %
Neutro Abs: 2.5 10*3/uL (ref 1.7–7.7)
Neutrophils Relative %: 59 %
Platelet Count: 131 10*3/uL — ABNORMAL LOW (ref 150–400)
RBC: 4.89 MIL/uL (ref 4.22–5.81)
RDW: 14.3 % (ref 11.5–15.5)
WBC Count: 4.3 10*3/uL (ref 4.0–10.5)
nRBC: 0 % (ref 0.0–0.2)

## 2019-06-10 LAB — CMP (CANCER CENTER ONLY)
ALT: 31 U/L (ref 0–44)
AST: 27 U/L (ref 15–41)
Albumin: 4.1 g/dL (ref 3.5–5.0)
Alkaline Phosphatase: 88 U/L (ref 38–126)
Anion gap: 8 (ref 5–15)
BUN: 16 mg/dL (ref 8–23)
CO2: 27 mmol/L (ref 22–32)
Calcium: 8.9 mg/dL (ref 8.9–10.3)
Chloride: 107 mmol/L (ref 98–111)
Creatinine: 1.13 mg/dL (ref 0.61–1.24)
GFR, Est AFR Am: >60 mL/min (ref >60)
GFR, Estimated: >60 mL/min (ref >60)
Glucose, Bld: 88 mg/dL (ref 70–99)
Potassium: 4.2 mmol/L (ref 3.5–5.1)
Sodium: 142 mmol/L (ref 135–145)
Total Bilirubin: 0.5 mg/dL (ref 0.3–1.2)
Total Protein: 6.5 g/dL (ref 6.5–8.1)

## 2019-06-10 LAB — LACTATE DEHYDROGENASE: LDH: 210 U/L — ABNORMAL HIGH (ref 98–192)

## 2019-06-10 MED ORDER — LENALIDOMIDE 15 MG PO CAPS: 15.0000 mg | ORAL_CAPSULE | Freq: Every day | ORAL | 2 refills | Status: DC

## 2019-06-10 NOTE — Telephone Encounter (Signed)
Per Dr. Grier Mitts orders today: Revlimid prescription printed and faxed to [place] in [place] ([place]) [place]). Revlimid dose changed to 15 mg, with same directions. Faxed to  [phone] with [facility] sheet and [facility]'s [facility] #: [facility] [id number] [place] auth# [id number], 06/10/2019

## 2019-06-11 ENCOUNTER — Telehealth: Payer: Self-pay | Admitting: Hematology

## 2019-06-11 NOTE — Telephone Encounter (Signed)
Scheduled appt per 6/18 los. °

## 2019-06-24 ENCOUNTER — Inpatient Hospital Stay: Payer: No Typology Code available for payment source

## 2019-06-24 ENCOUNTER — Inpatient Hospital Stay: Payer: No Typology Code available for payment source | Attending: Hematology

## 2019-06-24 ENCOUNTER — Other Ambulatory Visit: Payer: Self-pay

## 2019-06-24 ENCOUNTER — Ambulatory Visit: Payer: Non-veteran care | Admitting: Hematology

## 2019-06-24 VITALS — BP 128/70 | HR 50 | Temp 98.4°F | Resp 18 | Wt 225.5 lb

## 2019-06-24 DIAGNOSIS — Z5112 Encounter for antineoplastic immunotherapy: Secondary | ICD-10-CM | POA: Insufficient documentation

## 2019-06-24 DIAGNOSIS — Z95828 Presence of other vascular implants and grafts: Secondary | ICD-10-CM

## 2019-06-24 DIAGNOSIS — C8330 Diffuse large B-cell lymphoma, unspecified site: Secondary | ICD-10-CM

## 2019-06-24 DIAGNOSIS — C8298 Follicular lymphoma, unspecified, lymph nodes of multiple sites: Secondary | ICD-10-CM | POA: Insufficient documentation

## 2019-06-24 LAB — CBC WITH DIFFERENTIAL/PLATELET
Abs Immature Granulocytes: 0 10*3/uL (ref 0.00–0.07)
Basophils Absolute: 0.1 10*3/uL (ref 0.0–0.1)
Basophils Relative: 2 %
Eosinophils Absolute: 0.1 10*3/uL (ref 0.0–0.5)
Eosinophils Relative: 2 %
HCT: 41.2 % (ref 39.0–52.0)
Hemoglobin: 13.9 g/dL (ref 13.0–17.0)
Immature Granulocytes: 0 %
Lymphocytes Relative: 45 %
Lymphs Abs: 1.5 10*3/uL (ref 0.7–4.0)
MCH: 29.7 pg (ref 26.0–34.0)
MCHC: 33.7 g/dL (ref 30.0–36.0)
MCV: 88 fL (ref 80.0–100.0)
Monocytes Absolute: 0.6 10*3/uL (ref 0.1–1.0)
Monocytes Relative: 18 %
Neutro Abs: 1.1 10*3/uL — ABNORMAL LOW (ref 1.7–7.7)
Neutrophils Relative %: 33 %
Platelets: 155 10*3/uL (ref 150–400)
RBC: 4.68 MIL/uL (ref 4.22–5.81)
RDW: 14.2 % (ref 11.5–15.5)
WBC: 3.4 10*3/uL — ABNORMAL LOW (ref 4.0–10.5)
nRBC: 0 % (ref 0.0–0.2)

## 2019-06-24 LAB — CMP (CANCER CENTER ONLY)
ALT: 33 U/L (ref 0–44)
AST: 33 U/L (ref 15–41)
Albumin: 4 g/dL (ref 3.5–5.0)
Alkaline Phosphatase: 82 U/L (ref 38–126)
Anion gap: 8 (ref 5–15)
BUN: 20 mg/dL (ref 8–23)
CO2: 24 mmol/L (ref 22–32)
Calcium: 8.1 mg/dL — ABNORMAL LOW (ref 8.9–10.3)
Chloride: 110 mmol/L (ref 98–111)
Creatinine: 1.07 mg/dL (ref 0.61–1.24)
GFR, Est AFR Am: >60 mL/min (ref >60)
GFR, Estimated: >60 mL/min (ref >60)
Glucose, Bld: 80 mg/dL (ref 70–99)
Potassium: 4.4 mmol/L (ref 3.5–5.1)
Sodium: 142 mmol/L (ref 135–145)
Total Bilirubin: 0.6 mg/dL (ref 0.3–1.2)
Total Protein: 6.2 g/dL — ABNORMAL LOW (ref 6.5–8.1)

## 2019-06-24 MED ORDER — DIPHENHYDRAMINE HCL 25 MG PO CAPS
ORAL_CAPSULE | ORAL | Status: AC
  Filled 2019-06-24: qty 2

## 2019-06-24 MED ORDER — SODIUM CHLORIDE 0.9 % IV SOLN
Freq: Once | INTRAVENOUS | Status: AC
  Administered 2019-06-24: 12:00:00 via INTRAVENOUS
  Filled 2019-06-24: qty 250

## 2019-06-24 MED ORDER — DIPHENHYDRAMINE HCL 25 MG PO CAPS
50.0000 mg | ORAL_CAPSULE | Freq: Once | ORAL | Status: AC
  Administered 2019-06-24: 50 mg via ORAL

## 2019-06-24 MED ORDER — HEPARIN SOD (PORK) LOCK FLUSH 100 UNIT/ML IV SOLN
500.0000 [IU] | Freq: Once | INTRAVENOUS | Status: AC | PRN
  Administered 2019-06-24: 500 [IU]
  Filled 2019-06-24: qty 5

## 2019-06-24 MED ORDER — ACETAMINOPHEN 325 MG PO TABS
ORAL_TABLET | ORAL | Status: AC
  Filled 2019-06-24: qty 2

## 2019-06-24 MED ORDER — SODIUM CHLORIDE 0.9% FLUSH
10.0000 mL | Freq: Once | INTRAVENOUS | Status: AC
  Administered 2019-06-24: 10 mL
  Filled 2019-06-24: qty 10

## 2019-06-24 MED ORDER — SODIUM CHLORIDE 0.9% FLUSH
10.0000 mL | INTRAVENOUS | Status: DC | PRN
  Administered 2019-06-24: 10 mL
  Filled 2019-06-24: qty 10

## 2019-06-24 MED ORDER — METHYLPREDNISOLONE SODIUM SUCC 125 MG IJ SOLR
INTRAMUSCULAR | Status: AC
  Filled 2019-06-24: qty 2

## 2019-06-24 MED ORDER — METHYLPREDNISOLONE SODIUM SUCC 125 MG IJ SOLR
125.0000 mg | Freq: Every day | INTRAMUSCULAR | Status: DC
  Administered 2019-06-24: 125 mg via INTRAVENOUS

## 2019-06-24 MED ORDER — SODIUM CHLORIDE 0.9 % IV SOLN
375.0000 mg/m2 | Freq: Once | INTRAVENOUS | Status: AC
  Administered 2019-06-24: 900 mg via INTRAVENOUS
  Filled 2019-06-24: qty 50

## 2019-06-24 MED ORDER — ACETAMINOPHEN 325 MG PO TABS
650.0000 mg | ORAL_TABLET | Freq: Once | ORAL | Status: AC
  Administered 2019-06-24: 650 mg via ORAL

## 2019-06-24 NOTE — Progress Notes (Signed)
Verbal order-per Dr.Kale: Ok to treat today with WBC 3.4 and ANC 1.1

## 2019-06-24 NOTE — Patient Instructions (Signed)
[facility] Discharge Instructions for Patients Receiving Chemotherapy  Today you received the following chemotherapy agents Rituxan  To help prevent nausea and vomiting after your treatment, we encourage you to take your nausea medication as directed by your MD.   If you develop nausea and vomiting that is not controlled by your nausea medication, call the clinic.   BELOW ARE SYMPTOMS THAT SHOULD BE REPORTED IMMEDIATELY:  *FEVER GREATER THAN 100.5 F  *CHILLS WITH OR WITHOUT FEVER  NAUSEA AND VOMITING THAT IS NOT CONTROLLED WITH YOUR NAUSEA MEDICATION  *UNUSUAL SHORTNESS OF BREATH  *UNUSUAL BRUISING OR BLEEDING  TENDERNESS IN MOUTH AND THROAT WITH OR WITHOUT PRESENCE OF ULCERS  *URINARY PROBLEMS  *BOWEL PROBLEMS  UNUSUAL RASH Items with * indicate a potential emergency and should be followed up as soon as possible.  Feel free to call the clinic should you have any questions or concerns. The clinic phone number is [phone].  Please show the CHEMO ALERT CARD at check-in to the Emergency Department and triage nurse.  Coronavirus (COVID-19) Are you at risk?  Are you at risk for the Coronavirus (COVID-19)?  To be considered HIGH RISK for Coronavirus (COVID-19), you have to meet the following criteria:  . Traveled to [place], [place], [place], [place] or [place]; or in the [place] to [place], [place], [place], or [place]; and have fever, cough, and shortness of breath within the last 2 weeks of travel OR . Been in close contact with a person diagnosed with COVID-19 within the last 2 weeks and have fever, cough, and shortness of breath . IF YOU DO NOT MEET THESE CRITERIA, YOU ARE CONSIDERED LOW RISK FOR COVID-19.  What to do if you are HIGH RISK for COVID-19?  . If you are having a medical emergency, call 911. . Seek medical care right away. Before you go to a doctor's office, urgent care or emergency department, call ahead and tell them about  your recent travel, contact with someone diagnosed with COVID-19, and your symptoms. You should receive instructions from your physician's office regarding next steps of care.  . When you arrive at healthcare provider, tell the healthcare staff immediately you have returned from visiting [place], [place], [place], [place] or [place]; or traveled in the [place] to [place], [place], [place], or [place]; in the last two weeks or you have been in close contact with a person diagnosed with COVID-19 in the last 2 weeks.   . Tell the health care staff about your symptoms: fever, cough and shortness of breath. . After you have been seen by a medical provider, you will be either: o Tested for (COVID-19) and discharged home on quarantine except to seek medical care if symptoms worsen, and asked to  - Stay home and avoid contact with others until you get your results (4-5 days)  - Avoid travel on public transportation if possible (such as bus, train, or airplane) or o Sent to the Emergency Department by EMS for evaluation, COVID-19 testing, and possible admission depending on your condition and test results.  What to do if you are LOW RISK for COVID-19?  Reduce your risk of any infection by using the same precautions used for avoiding the common cold or flu:  . Wash your hands often with soap and warm water for at least 20 seconds.  If soap and water are not readily available, use an alcohol-based hand sanitizer with at least 60% alcohol.  . If   coughing or sneezing, cover your mouth and nose by coughing or sneezing into the elbow areas of your shirt or coat, into a tissue or into your sleeve (not your hands). . Avoid shaking hands with others and consider head nods or verbal greetings only. . Avoid touching your eyes, nose, or mouth with unwashed hands.  . Avoid close contact with people who are sick. . Avoid places or events with large numbers of people in one location, like concerts or sporting  events. . Carefully consider travel plans you have or are making. . If you are planning any travel outside or inside the [place], visit the CDC's Travelers' Health webpage for the latest health notices. . If you have some symptoms but not all symptoms, continue to monitor at home and seek medical attention if your symptoms worsen. . If you are having a medical emergency, call 911.   ADDITIONAL HEALTHCARE OPTIONS FOR PATIENTS  [facility] [facility] / e-Visit: [email or web address]         [facility]: [phone]  [facility]: [phone]                   [facility]: [phone]    

## 2019-07-08 ENCOUNTER — Telehealth: Payer: Self-pay | Admitting: Hematology

## 2019-07-08 ENCOUNTER — Other Ambulatory Visit: Payer: Self-pay

## 2019-07-08 ENCOUNTER — Inpatient Hospital Stay (HOSPITAL_BASED_OUTPATIENT_CLINIC_OR_DEPARTMENT_OTHER): Payer: No Typology Code available for payment source | Admitting: Hematology

## 2019-07-08 ENCOUNTER — Inpatient Hospital Stay: Payer: No Typology Code available for payment source

## 2019-07-08 VITALS — BP 135/71 | HR 60 | Temp 97.8°F | Resp 17 | Ht 73.0 in | Wt 226.2 lb

## 2019-07-08 DIAGNOSIS — Z86718 Personal history of other venous thrombosis and embolism: Secondary | ICD-10-CM | POA: Diagnosis not present

## 2019-07-08 DIAGNOSIS — Z79899 Other long term (current) drug therapy: Secondary | ICD-10-CM

## 2019-07-08 DIAGNOSIS — C8330 Diffuse large B-cell lymphoma, unspecified site: Secondary | ICD-10-CM

## 2019-07-08 DIAGNOSIS — C8298 Follicular lymphoma, unspecified, lymph nodes of multiple sites: Secondary | ICD-10-CM | POA: Diagnosis not present

## 2019-07-08 DIAGNOSIS — Z5112 Encounter for antineoplastic immunotherapy: Secondary | ICD-10-CM | POA: Diagnosis not present

## 2019-07-08 LAB — CMP (CANCER CENTER ONLY)
ALT: 29 U/L (ref 0–44)
AST: 25 U/L (ref 15–41)
Albumin: 3.9 g/dL (ref 3.5–5.0)
Alkaline Phosphatase: 80 U/L (ref 38–126)
Anion gap: 10 (ref 5–15)
BUN: 15 mg/dL (ref 8–23)
CO2: 26 mmol/L (ref 22–32)
Calcium: 8.6 mg/dL — ABNORMAL LOW (ref 8.9–10.3)
Chloride: 107 mmol/L (ref 98–111)
Creatinine: 1.03 mg/dL (ref 0.61–1.24)
GFR, Est AFR Am: >60 mL/min (ref >60)
GFR, Estimated: >60 mL/min (ref >60)
Glucose, Bld: 87 mg/dL (ref 70–99)
Potassium: 4.1 mmol/L (ref 3.5–5.1)
Sodium: 143 mmol/L (ref 135–145)
Total Bilirubin: 0.6 mg/dL (ref 0.3–1.2)
Total Protein: 6.3 g/dL — ABNORMAL LOW (ref 6.5–8.1)

## 2019-07-08 LAB — CBC WITH DIFFERENTIAL/PLATELET
Abs Immature Granulocytes: 0.02 10*3/uL (ref 0.00–0.07)
Basophils Absolute: 0.1 10*3/uL (ref 0.0–0.1)
Basophils Relative: 2 %
Eosinophils Absolute: 0.2 10*3/uL (ref 0.0–0.5)
Eosinophils Relative: 5 %
HCT: 43.5 % (ref 39.0–52.0)
Hemoglobin: 14.5 g/dL (ref 13.0–17.0)
Immature Granulocytes: 1 %
Lymphocytes Relative: 27 %
Lymphs Abs: 1.1 10*3/uL (ref 0.7–4.0)
MCH: 29.6 pg (ref 26.0–34.0)
MCHC: 33.3 g/dL (ref 30.0–36.0)
MCV: 88.8 fL (ref 80.0–100.0)
Monocytes Absolute: 0.8 10*3/uL (ref 0.1–1.0)
Monocytes Relative: 18 %
Neutro Abs: 2 10*3/uL (ref 1.7–7.7)
Neutrophils Relative %: 47 %
Platelets: 112 10*3/uL — ABNORMAL LOW (ref 150–400)
RBC: 4.9 MIL/uL (ref 4.22–5.81)
RDW: 13.9 % (ref 11.5–15.5)
WBC: 4.1 10*3/uL (ref 4.0–10.5)
nRBC: 0 % (ref 0.0–0.2)

## 2019-07-08 NOTE — Progress Notes (Signed)
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 Cytogenetics and Fish showed BCL 2 positivity but negative for BCL 6 and cMYC CD10 positivity suggestive of possibly GCB subtype.  Patient received first cycle of R CHOP on 08/08/2015 and intrathecal methotrexate with hydrocortisone on 08/09/2015. Patient subsequently was admitted with ARDS likely due to neulasta -treated with high dose steroids and empirically with broad spectrum antibiotics.  2nd cycle of chemotherapy delayed to allow for rehabilitation (was due on 08/31/2015).  Received R-mini-CHOP for cycle 2 and tolerated it without significant cytopenias. Was admitted briefly for a mild lower extremity cellulitis and DVT. Cellulitis now resolved. Patient completing his oral antibiotics.  PET/CT scan on 09/29/2015 with no evidence of disease progression. Good response in his mesenteric lymph nodes. Inguinal  lymph nodes on the right side. Decreased uptake in his C5 level.  No new lesions noted.   Cycle 3 (10/02/2015)  of R- CHOP ( we increased the doxorubicin dose back to 50 mg/m and keep the cyclophosphamide dose reduction at 400 mg/m]  Cycle 4 (10/23/2015) of R-CHOP ( we increased the doxorubicin dose back to 50 mg/m and kept the cyclophosphamide dose reduction at 400 mg/m].  PET/CT 11/10/2015: Shows improvement in most lesions but is noted to have a new FDG avid lymph node in the mesentery of the small bowel.  Cycle 5 on 11/13/2015 R CHOP - received full dose.  Cycle 6 on 12/11/2015 R-CHOP full dose.  Admitted with neutropenic fevers and treatment for possible early pneumonia.  This resolved and he was able to discharge home  01/09/2016 through 01/22/2016: The patient was treated to the C4-C6 levels of the spine to a dose of 30 gray in 10 fractions using a 2 field technique. The patient was also treated to an abdominal lymph node region to a dose of 30 gray in 10 fractions using a 3 field technique. This treatment consisted of a 3-D conformal technique with daily image guidance utilized.   Repeat PET CT scan in March 12 2016 -showed some enlarged mesenteric lymph nodes which were FDG avid.  CT guided FNA of mesenteric lymph nodes at [facility] on 03/29/2016 - showed scant cellularity. No overt evidence of lymphoma.  Rpt PET/CT 05/15/2016 at [facility]- show similar appearing enlarged mesenteric lymph nodes which are FDG avid. No change in size and no other evidence of disease progression.  PET/CT 07/24/2016 - and [facility] shows no evidence of disease progression  PET/CT 01/22/2017:  Result Impression   1.Stable low-level metabolic activity in small lymph nodes in the small bowel mesentery. No new FDG avid disease. 2.Persistent hypermetabolic soft tissue thickening overlying the coccyx which could relate to a decubitus ulcer and should be  amenable to direct inspection. 3.Stable pulmonary nodules which do not appear hypermetabolic, however, several are below the size resolution of PET in size. Continued attention on follow-up is suggested. 4.Previously seen hypermetabolic skin thickening near the right ear has resolved. 5.Ancillary CT findings as above.    INTERVAL HISTORY:   Ethan Stewart is here for management and evaluation of [name], grade 1-2. The patient's last visit with [place] was on 06/10/2019. The pt reports that he is doing well overall.  The pt reports he has been staying at home, but he has been working in his garden and yard.   Today is day 15 cycle 9 of rituximab. The pt has no prohibitive toxicities from continuing this treatment at this time. Tolerating the Revlimid without any issues.  Lab results today (07/08/19) of CBC w/diff and CMP is as follows: all values are WNL except for platelets at 112, calcium at 8.6, and total protein at 6.3.  On review of systems, pt and denies any symptoms.    REVIEW OF SYSTEMS:   A 10+ POINT REVIEW OF SYSTEMS WAS OBTAINED including neurology, dermatology, psychiatry, cardiac, respiratory, lymph, extremities, GI, GU, Musculoskeletal, constitutional, breasts, reproductive, HEENT.  All pertinent positives are noted in the HPI.  All others are negative.     ALLERGIES:  is allergic to neulasta [pegfilgrastim].  MEDICATIONS:  Current Outpatient Medications  Medication Sig Dispense Refill  . diazepam (VALIUM) 2 MG tablet Take 2 mg by mouth daily as needed for anxiety.    Ethan Stewart HYDROcodone-acetaminophen (NORCO) 5-325 MG tablet Take 1-2 tablets by mouth every 6 (six) hours as needed for moderate pain or severe pain. (Patient not taking: Reported on 12/17/2018) 20 tablet 0  . lenalidomide (REVLIMID) 15 MG capsule Take 1 capsule (15 mg total) by mouth daily. 3 weeks on 1 week off. 21 capsule 2  . levothyroxine (SYNTHROID, LEVOTHROID) 88 MCG tablet  Take 88 mcg by mouth daily before breakfast.   2  . lidocaine-prilocaine (EMLA) cream Apply 1 application topically as needed (for port access).    . Multiple Vitamin (MULTIVITAMIN WITH MINERALS) TABS tablet Take 1 tablet by mouth daily.     . ondansetron (ZOFRAN) 8 MG tablet Take 1 tablet (8 mg total) by mouth every 8 (eight) hours as needed for nausea or vomiting. 30 tablet 1  . prochlorperazine (COMPAZINE) 10 MG tablet Take 1 tablet (10 mg total) by mouth every 8 (eight) hours as needed for nausea or vomiting. 30 tablet 0  . rivaroxaban (XARELTO) 10 MG TABS tablet Take 1 tablet (10 mg total) by mouth daily. 30 tablet 5  . senna (SENOKOT) 8.6 MG TABS tablet Take 2 tablets by mouth at bedtime.    . simvastatin (ZOCOR) 20 MG tablet Take 20 mg by mouth daily.    . temazepam (RESTORIL) 15 MG capsule Take 15 mg by mouth at bedtime as needed for sleep.      No current facility-administered medications for this visit.     PHYSICAL EXAMINATION: ECOG PERFORMANCE STATUS: 1 - Symptomatic but completely ambulatory  Vitals:   07/08/19 0923  BP: 135/71  Pulse: 60  Resp: 17  Temp: 97.8 F (36.6 C)  SpO2: 100%   Filed Weights   07/08/19 1017  Weight: 226 lb 3.2 oz (102.6 kg)    GENERAL:alert, in no acute distress and comfortable SKIN: no acute rashes, no significant lesions EYES: conjunctiva are pink and non-injected, sclera anicteric OROPHARYNX: MMM, no exudates, no oropharyngeal erythema or ulceration NECK: supple, no JVD LYMPH:  no palpable lymphadenopathy in the cervical, axillary or inguinal regions LUNGS: clear to auscultation b/l with normal respiratory effort HEART: regular rate & rhythm ABDOMEN:  normoactive bowel sounds , non tender, not distended. Extremity: no pedal edema PSYCH: alert & oriented x 3 with fluent speech NEURO: no focal motor/sensory deficits   LABORATORY DATA:   CBC Latest Ref Rng & Units 07/08/2019 06/24/2019 06/10/2019  WBC 4.0 - 10.5 K/uL 4.1 3.4(L) 4.3   Hemoglobin 13.0 - 17.0 g/dL 14.5 13.9 14.4  Hematocrit 39.0 - 52.0 % 43.5 41.2 43.7  Platelets 150 - 400 K/uL 112(L) 155 131(L)     CMP Latest Ref Rng & Units 07/08/2019 06/24/2019 06/10/2019  Glucose 70 - 99 mg/dL 87 80 88  BUN 8 - 23 mg/dL _0 Creatinine 0.61 - 1.24 mg/dL 1.03 1.07 1.13  Sodium 135 - 145 mmol/L 143 142 142  Potassium 3.5 - 5.1 mmol/L 4.1 4.4 4.2  Chloride 98 - 111 mmol/L 107 110 107  CO2 22 - 32 mmol/L _1 Calcium 8.9 - 10.3 mg/dL 8.6(L) 8.1(L) 8.9  Total Protein 6.5 - 8.1 g/dL 6.3(L) 6.2(L) 6.5  Total Bilirubin 0.3 - 1.2 mg/dL 0.6 0.6 0.5  Alkaline Phos 38 - 126 U/L 80 82 88  AST 15 - 41 U/L 25 33 27  ALT 0 - 44 U/L 29 33 31   . Lab Results  Component Value Date   LDH 210 (H) 06/10/2019    Fine Needle Aspiration, Lymph NodeResulted: 12/04/2018 1:14 PM [facility] Result Narrative  ACCESSION NUMBER: [id number] RECEIVED: 11/27/2018 ORDERING PHYSICIAN: RAKHEE Megan Mans , MD PATIENT NAME: Ethan Stewart * Amended * Final Cytologic Interpretation AMENDED Stewart  AMENDED 12/04/2018 TO ADD DIAGNOSIS   A. Right inguinal lymph node, Fine Needle Aspiration II (smears and cell block): B-cell lymphoma with germinal center phenotype (see comment)  Specimen Adequacy: Satisfactory for evaluation.  B. Cytology core biopsy: B-cell lymphoma with germinal center phenotype (see comment)  Specimen Adequacy: Satisfactory for evaluation.    COMMENT:Sections of the core biopsy demonstrate an atypical vaguely nodular lymphoid infiltrate composed of medium sized lymphoid cells with mature chromatin, irregular nuclear membranes and scant cytoplasm. Focally some areas show larger cells. Few mitotic figures are present. Appropriately controlled immunohistochemical stains are performed. Sheets of B-cells are highlighted by CD20. These B-cells co-express BCL2 and BCL6 (focal, nodular  distribution). They are negative for CD30 and MYC. CD21 demonstrates few residual disrupted follicular dendritic meshworks. The proliferation index is low (10-20%), as demonstrated by Ki-67. Scattered small T-cells are highlighted by CD3. Concurrent flow cytometry identified a kappa light chain restricted B-cell population, co-expressing CD10.  Overall, the findings are consistent with involvement by a B-cell lymphoma with a germinal center phenotype (GC-type), with focal follicular architecture and a low proliferation index. Given the nature of the sample (i.e. core biopsy) a final architectural subclassification is not possible. If clinically feasible, an excisional biopsy is recommended.  Correlation with clinical and cytogenetic/molecular data is recommended.    12/11/18 Inguinal LN Biopsy  Interpretation Tissue-Flow Cytometry - MONOCLONAL B-CELL POPULATION EXPRESSING CD10 COMPRISES 92% OF ALL LYMPHOCYTES - SEE COMMENT Microscopic Gated population: Flow cytometric immunophenotyping is  performed using antibiodies to the antigens listed in the table below. Electronic gates are placed around a cell cluster displaying light scatter properties corresponding to lymphocytes. - Abnormal Cells in gated population: 92 % - Phenotype of Abnormal Cells: CD10, CD19, CD20, CD21, CD22, HLA-Dr, Kappa Diagnosis Lymph node for lymphoma, Deep Right Inguinal - FOLLICULAR LYMPHOMA, GRADE 1-2 - SEE COMMENT Microscopic Comment The nodal architecture is effaced by a proliferation back to back follicles composed of medium to large lymphocytes with irregular, cleaved nuclei and scattered centroblasts. There are no diffuse areas of growth present within the examined nodal material. Immunohistochemistry performed on the tissue highlights a nodular B-cell proliferation with expression of CD20, CD10, bcl-6, and Bcl-2. CD21 highlights expanded follicular dendritic meshworks. The Ki-67 shows a slightly  increased proliferation rate of 30-40%. The atypical lymphocytes are negative for CD5. CD3 immunohistochemistry highlights the background T-cells. Flow cytometry was performed. A monoclonal B-cell populaton expressing CD10 comprises 92% of all lymphocytes (See [phone]). Overall, the findings are consistent with a diagnosis of low grade follicular lymphoma, Grade 1-2. Of note, the increased ki-67 may portend a more aggressive course despite the low histologic grade.   01/18/19 BM Biopsy:   05/05/19 BM Biopsy:     RADIOGRAPHIC STUDIES: I have personally reviewed the radiological images as listed and agreed with the findings in the Stewart. No results found.    ASSESSMENT & PLAN:   1) Diffuse large B-cell lymphoma stage IV AE with involvement of mesenteric, inguinal lymph nodes and C5 vertebral body with spinal cord impingement  Patient is status post six cycles of R-CHOP and R-mini-CHOP, completed in 2016, as noted above.  2) History of capillary leak syndrome with ARDS requiring intubation likely due to G-CSF.  3) Newly diagnosed Follicular Lymphoma, Grade 1-2   11/02/18 CT A/P which revealed Negative for inguinal or bowel hernia but positive for bulky right inguinal and external iliac lymphadenopathy which is new since a 2017 PET-CT and highly suspicious for recurrent Lymphoma. This would be amenable to Ultrasound-guided needle biopsy. 2. No abdominal or contralateral left hemi pelvic lymphadenopathy. No other acute findings identified in the abdomen or pelvis. 3. Chronic pneumobilia.  Aortic Atherosclerosis.  11/12/18 PET/CT which revealed  Interval development of multistation lymphadenopathy in the abdomen and pelvis as detailed above- [place] 5. 2. Presence of pneumobilia could reflect sphincter of Oddi dysfunction versus recent procedure. Correlate with clinical history and upcoming comprehensive metabolic panel. 3. Ancillary CT findings   11/27/18 US guided LN Biopsy  pathology Stewart is as noted above. " B-cell lymphoma with a germinal center phenotype (GC-type), with focal follicular architecture and a low proliferation index. Given the nature of the sample (i.e. core biopsy) a final architectural subclassification is not possible. If clinically feasible, an excisional biopsy is recommended."  12/11/18 Right Inguinal LN biopsy revealed Grade 1-2 Follicular Lymphoma  0/0/93 CT A/P revealed Today's study demonstrates clear progression of disease with increased number and size of numerous enlarged lymph nodes throughout the lower thorax, abdomen and pelvis, as detailed above. 2. In addition, there are several small pulmonary nodules in the lung bases. The largest of these are clustered in the posterior aspect of the right lower lobe in an apparent area of scarring which is very similar to prior PET-CT 03/12/2016, favored to be benign. However, the smaller nodules which measure up to 6 mm in size are nonspecific and warrant attention on follow-up studies. 3. Aortic atherosclerosis, in addition to least 2 vessel coronary artery disease. Assessment for  potential risk factor modification, dietary therapy or pharmacologic therapy may be warranted, if clinically indicated. 4. Additional incidental findings, as above. 01/18/19 BM Bx revealed normocellular bone marrow with trilineage hematopoiesis Stage II indicated with 01/26/19 CT  Began C1 of 90m Revlimid and Rituxan on 02/05/19  05/05/19 BM Bx revealed normocellular bone marrow with erythroid hyperplasia, and no evidence of lymphoma  05/11/19 PET/CT revealed "Near complete metabolic response to therapy of lymphoma since the prior PET of 11/12/2018. Low-level hypermetabolism at the site of adenopathy within the abdominal retroperitoneum persists. ([place]) 2. There has also been resolution of abdominopelvic adenopathy since the diagnostic CT of 01/26/2019. 2. Nonspecific right lower lobe pulmonary nodules, similar. 3.  Coronary artery atherosclerosis. Aortic Atherosclerosis."   4) History of right lower extremity distal DVT. Resolved. S/p 6 months of treatment in 02/2016 -Returning to 177mXarelto while taking Revlimid  5) Mild chronic thrombocytopenia- resolved 03/05/19 PLT at 175k   PLAN:  -Discussed pt labwork today, 07/08/19; all values are WNL except for platelets at 112, calcium at 8.6, and total protein at 6.3. The pt has no prohibitive toxicities from continuing rituximab and Revlimid 153mo daily at this time.  -Recommended that the pt continue to eat well, drink at least 48-64 oz of water each day, and walk 20-30 minutes each day.   -Discussed Rx refill for Xarelto -See back for next treatment cycle   FOLLOW UP: -Patient prefers to move his appointments from 8/6 to 7/30 to stay on schedule.   The total time spent in the appt was 25 minutes and more than 50% was on counseling and direct patient cares.    GauSullivan Lone MS Hematology/Oncology Physician ConFort Walton Beach Medical CenterOffice):       [phone] cell):  [phone]):           [phone], AmbJacqualyn Poseym acting as a scribe for Dr. GauSullivan Lone .I have reviewed the above documentation for accuracy and completeness, and I agree with the above. .GaBrunetta Stewart

## 2019-07-08 NOTE — Telephone Encounter (Signed)
Per 7/16 los Patient prefers to move his appointments from 8/6 to 7/30 to stay on schedule.  MD is off on 7/30, called patient to inform patient, and he was okay with keeping his appt for the date it was originally scheduled for 8/6.

## 2019-07-09 ENCOUNTER — Other Ambulatory Visit: Payer: Self-pay | Admitting: *Deleted

## 2019-07-09 MED ORDER — LENALIDOMIDE 15 MG PO CAPS: 15.0000 mg | ORAL_CAPSULE | Freq: Every day | ORAL | 0 refills | Status: DC

## 2019-07-09 NOTE — Telephone Encounter (Signed)
Refilled Revlimid per Dr.Kale's verbal order 07/09/2019.  Refill faxed to [facility]. "Ethan" Stewart in [place] on 07/09/2019.  Office cover sheet stating referral #: [place] [id number]. [facility] Revlimid form completed and attached. Medication list attached. [employer] Auth # [id number], 07/09/2019 Fax confirmation received

## 2019-07-22 ENCOUNTER — Ambulatory Visit: Payer: Non-veteran care | Admitting: Hematology

## 2019-07-22 ENCOUNTER — Other Ambulatory Visit: Payer: Non-veteran care

## 2019-07-22 ENCOUNTER — Ambulatory Visit: Payer: Non-veteran care

## 2019-07-28 NOTE — Progress Notes (Signed)
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 Cytogenetics and Fish showed BCL 2 positivity but negative for BCL 6 and cMYC CD10 positivity suggestive of possibly GCB subtype.  Patient received first cycle of R CHOP on 08/08/2015 and intrathecal methotrexate with hydrocortisone on 08/09/2015. Patient subsequently was admitted with ARDS likely due to neulasta -treated with high dose steroids and empirically with broad spectrum antibiotics.  2nd cycle of chemotherapy delayed to allow for rehabilitation (was due on 08/31/2015).  Received R-mini-CHOP for cycle 2 and tolerated it without significant cytopenias. Was admitted briefly for a mild lower extremity cellulitis and DVT. Cellulitis now resolved. Patient completing his oral antibiotics.  PET/CT scan on 09/29/2015 with no evidence of disease progression. Good response in his  mesenteric lymph nodes. Inguinal lymph nodes on the right side. Decreased uptake in his C5 level.  No new lesions noted.   Cycle 3 (10/02/2015)  of R- CHOP ( we increased the doxorubicin dose back to 50 mg/m and keep the cyclophosphamide dose reduction at 400 mg/m]  Cycle 4 (10/23/2015) of R-CHOP ( we increased the doxorubicin dose back to 50 mg/m and kept the cyclophosphamide dose reduction at 400 mg/m].  PET/CT 11/10/2015: Shows improvement in most lesions but is noted to have a new FDG avid lymph node in the mesentery of the small bowel.  Cycle 5 on 11/13/2015 R CHOP - received full dose.  Cycle 6 on 12/11/2015 R-CHOP full dose.  Admitted with neutropenic fevers and treatment for possible early pneumonia.  This resolved and he was able to discharge home  01/09/2016 through 01/22/2016: The patient was treated to the C4-C6 levels of the spine to a dose of 30 gray in 10 fractions using a 2 field technique. The patient was also treated to an abdominal lymph node region to a dose of 30 gray in 10 fractions using a 3 field technique. This treatment consisted of a 3-D conformal technique with daily image guidance utilized.   Repeat PET CT scan in March 12 2016 -showed some enlarged mesenteric lymph nodes which were FDG avid.  CT guided FNA of mesenteric lymph nodes at [facility] on 03/29/2016 - showed scant cellularity. No overt evidence of lymphoma.  Rpt PET/CT 05/15/2016 at [facility]- show similar appearing enlarged mesenteric lymph nodes which are FDG avid. No change in size and no other evidence of disease progression.  PET/CT 07/24/2016 - and [facility] shows no evidence of disease progression  PET/CT 01/22/2017:  Result Impression   1.Stable low-level metabolic activity in small lymph nodes in the small bowel mesentery. No new FDG avid disease. 2.Persistent hypermetabolic soft tissue thickening overlying the coccyx which could relate to a  decubitus ulcer and should be amenable to direct inspection. 3.Stable pulmonary nodules which do not appear hypermetabolic, however, several are below the size resolution of PET in size. Continued attention on follow-up is suggested. 4.Previously seen hypermetabolic skin thickening near the right ear has resolved. 5.Ancillary CT findings as above.    INTERVAL HISTORY:   Ethan Stewart is here for management and evaluation of [name], grade 1-2. The patient's last visit with [place] was on 07/08/2019. The pt reports that he is doing well overall.  The pt reports that two weeks ago, he woke up with some neck soreness. It has been getting better overtime but is still present.   Lab results today (07/29/2019) of CBC w/diff and CMP is as follows: all values are WNL except for PLT at 128k, total protein at 6.2.  On review of systems, pt reports neck soreness and denies any other symptoms.   REVIEW OF SYSTEMS:   A 10+ POINT REVIEW OF SYSTEMS WAS OBTAINED including neurology, dermatology, psychiatry, cardiac, respiratory, lymph, extremities, GI, GU, Musculoskeletal, constitutional, breasts, reproductive, HEENT.  All pertinent positives are noted in the HPI.  All others are negative.   ALLERGIES:  is allergic to neulasta [pegfilgrastim].  MEDICATIONS:  Current Outpatient Medications  Medication Sig Dispense Refill  . diazepam (VALIUM) 2 MG tablet Take 2 mg by mouth daily as needed for anxiety.    [name] Kitchen HYDROcodone-acetaminophen (NORCO) 5-325 MG tablet Take 1-2 tablets by mouth every 6 (six) hours as needed for moderate pain or severe pain. (Patient not taking: Reported on 12/17/2018) 20 tablet 0  . lenalidomide (REVLIMID) 15 MG capsule Take 1 capsule (15 mg total) by mouth daily. 3 weeks on 1 week off. 21 capsule 0  . levothyroxine (SYNTHROID, LEVOTHROID) 88 MCG tablet Take 88 mcg by mouth daily before breakfast.   2  . lidocaine-prilocaine (EMLA) cream Apply 1  application topically as needed (for port access).    . Multiple Vitamin (MULTIVITAMIN WITH MINERALS) TABS tablet Take 1 tablet by mouth daily.     . ondansetron (ZOFRAN) 8 MG tablet Take 1 tablet (8 mg total) by mouth every 8 (eight) hours as needed for nausea or vomiting. 30 tablet 1  . prochlorperazine (COMPAZINE) 10 MG tablet Take 1 tablet (10 mg total) by mouth every 8 (eight) hours as needed for nausea or vomiting. 30 tablet 0  . rivaroxaban (XARELTO) 10 MG TABS tablet Take 1 tablet (10 mg total) by mouth daily. 30 tablet 5  . senna (SENOKOT) 8.6 MG TABS tablet Take 2 tablets by mouth at bedtime.    . simvastatin (ZOCOR) 20 MG tablet Take 20 mg by mouth daily.    . temazepam (RESTORIL) 15 MG capsule Take 15 mg by mouth at bedtime as needed for sleep.      No current facility-administered medications for this visit.     PHYSICAL EXAMINATION: ECOG PERFORMANCE STATUS: 1 - Symptomatic but completely ambulatory  Vitals:   07/29/19 0856  BP: (!) 143/72  Pulse: (!) 58  Resp: 17  Temp: 98.5 F (36.9 C)  SpO2: 100%   Filed Weights   07/29/19 0856  Weight: 224 lb (101.6 kg)    GENERAL:alert, in no acute distress and comfortable SKIN: no acute rashes, no significant  lesions EYES: conjunctiva are pink and non-injected, sclera anicteric OROPHARYNX: MMM, no exudates, no oropharyngeal erythema or ulceration NECK: supple, no JVD LYMPH:  no palpable lymphadenopathy in the cervical, axillary or inguinal regions LUNGS: clear to auscultation b/l with normal respiratory effort HEART: regular rate & rhythm ABDOMEN:  normoactive bowel sounds , non tender, not distended. No palpable hepatosplenomegaly.  Extremity: no pedal edema PSYCH: alert & oriented x 3 with fluent speech NEURO: no focal motor/sensory deficits    LABORATORY DATA:   CBC Latest Ref Rng & Units 07/29/2019 07/08/2019 06/24/2019  WBC 4.0 - 10.5 K/uL 4.0 4.1 3.4(L)  Hemoglobin 13.0 - 17.0 g/dL 14.2 14.5 13.9  Hematocrit 39.0 -  52.0 % 41.0 43.5 41.2  Platelets 150 - 400 K/uL 128(L) 112(L) 155     CMP Latest Ref Rng & Units 07/29/2019 07/08/2019 06/24/2019  Glucose 70 - 99 mg/dL 97 87 80  BUN 8 - 23 mg/dL 19 15 20  Creatinine 0.61 - 1.24 mg/dL 0.95 1.03 1.07  Sodium 135 - 145 mmol/L 142 143 142  Potassium 3.5 - 5.1 mmol/L 4.4 4.1 4.4  Chloride 98 - 111 mmol/L 108 107 110  CO2 22 - 32 mmol/L 23 26 24  Calcium 8.9 - 10.3 mg/dL 8.9 8.6(L) 8.1(L)  Total Protein 6.5 - 8.1 g/dL 6.2(L) 6.3(L) 6.2(L)  Total Bilirubin 0.3 - 1.2 mg/dL 0.6 0.6 0.6  Alkaline Phos 38 - 126 U/L 80 80 82  AST 15 - 41 U/L 32 25 33  ALT 0 - 44 U/L 37 29 33   . Lab Results  Component Value Date   LDH 210 (H) 06/10/2019    Fine Needle Aspiration, Lymph NodeResulted: 12/04/2018 1:14 PM [facility] Result Narrative  ACCESSION NUMBER: [id number] RECEIVED: 11/27/2018 ORDERING PHYSICIAN: RAKHEE RAJAN VAIDYA , MD PATIENT NAME: Cordrey, Everitt LYN CYTOLOGY REPORT * Amended * Final Cytologic Interpretation AMENDED REPORT  AMENDED 12/04/2018 TO ADD DIAGNOSIS   A. Right inguinal lymph node, Fine Needle Aspiration II (smears and cell block): B-cell lymphoma with germinal center phenotype (see comment)  Specimen Adequacy: Satisfactory for evaluation.  B. Cytology core biopsy: B-cell lymphoma with germinal center phenotype (see comment)  Specimen Adequacy: Satisfactory for evaluation.    COMMENT:Sections of the core biopsy demonstrate an atypical vaguely nodular lymphoid infiltrate composed of medium sized lymphoid cells with mature chromatin, irregular nuclear membranes and scant cytoplasm. Focally some areas show larger cells. Few mitotic figures are present. Appropriately controlled immunohistochemical stains are performed. Sheets of B-cells are highlighted by CD20. These B-cells co-express BCL2 and BCL6 (focal, nodular distribution). They are negative for CD30 and MYC. CD21 demonstrates  few residual disrupted follicular dendritic meshworks. The proliferation index is low (10-20%), as demonstrated by Ki-67. Scattered small T-cells are highlighted by CD3. Concurrent flow cytometry identified a kappa light chain restricted B-cell population, co-expressing CD10.  Overall, the findings are consistent with involvement by a B-cell lymphoma with a germinal center phenotype (GC-type), with focal follicular architecture and a low proliferation index. Given the nature of the sample (i.e. core biopsy) a final architectural subclassification is not possible. If clinically feasible, an excisional biopsy is recommended.  Correlation with clinical and cytogenetic/molecular data is recommended.    12/11/18 Inguinal LN Biopsy  Interpretation Tissue-Flow Cytometry - MONOCLONAL B-CELL POPULATION EXPRESSING CD10 COMPRISES 92% OF ALL LYMPHOCYTES - SEE COMMENT Microscopic Gated population: Flow cytometric immunophenotyping is performed using antibiodies to the antigens listed in the table below. Electronic gates are placed around a cell   cluster displaying light scatter properties corresponding to lymphocytes. - Abnormal Cells in gated population: 92 % - Phenotype of Abnormal Cells: CD10, CD19, CD20, CD21, CD22, HLA-Dr, Kappa Diagnosis Lymph node for lymphoma, Deep Right Inguinal - FOLLICULAR LYMPHOMA, GRADE 1-2 - SEE COMMENT Microscopic Comment The nodal architecture is effaced by a proliferation back to back follicles composed of medium to large lymphocytes with irregular, cleaved nuclei and scattered centroblasts. There are no diffuse areas of growth present within the examined nodal material. Immunohistochemistry performed on the tissue highlights a nodular B-cell proliferation with expression of CD20, CD10, bcl-6, and Bcl-2. CD21 highlights expanded follicular dendritic meshworks. The Ki-67 shows a slightly increased proliferation rate of 30-40%. The atypical lymphocytes are  negative for CD5. CD3 immunohistochemistry highlights the background T-cells. Flow cytometry was performed. A monoclonal B-cell populaton expressing CD10 comprises 92% of all lymphocytes (See [id number]). Overall, the findings are consistent with a diagnosis of low grade follicular lymphoma, Grade 1-2. Of note, the increased ki-67 may portend a more aggressive course despite the low histologic grade.   01/18/19 BM Biopsy:   05/05/19 BM Biopsy:     RADIOGRAPHIC STUDIES: I have personally reviewed the radiological images as listed and agreed with the findings in the report. No results found.    ASSESSMENT & PLAN:   1) Diffuse large B-cell lymphoma stage IV AE with involvement of mesenteric, inguinal lymph nodes and C5 vertebral body with spinal cord impingement  Patient is status post six cycles of R-CHOP and R-mini-CHOP, completed in 2016, as noted above.  2) History of capillary leak syndrome with ARDS requiring intubation likely due to G-CSF.  3) Recently diagnosed Follicular Lymphoma, Grade 1-2   11/02/18 CT A/P which revealed Negative for inguinal or bowel hernia but positive for bulky right inguinal and external iliac lymphadenopathy which is new since a 2017 PET-CT and highly suspicious for recurrent Lymphoma. This would be amenable to Ultrasound-guided needle biopsy. 2. No abdominal or contralateral left hemi pelvic lymphadenopathy. No other acute findings identified in the abdomen or pelvis. 3. Chronic pneumobilia.  Aortic Atherosclerosis.  11/12/18 PET/CT which revealed  Interval development of multistation lymphadenopathy in the abdomen and pelvis as detailed above- Deauville 5. 2. Presence of pneumobilia could reflect sphincter of Oddi dysfunction versus recent procedure. Correlate with clinical history and upcoming comprehensive metabolic panel. 3. Ancillary CT findings   11/27/18 US guided LN Biopsy pathology report is as noted above. " B-cell lymphoma with a germinal  center phenotype (GC-type), with focal follicular architecture and a low proliferation index. Given the nature of the sample (i.e. core biopsy) a final architectural subclassification is not possible. If clinically feasible, an excisional biopsy is recommended."  12/11/18 Right Inguinal LN biopsy revealed Grade 1-2 Follicular Lymphoma  01/26/19 CT A/P revealed Today's study demonstrates clear progression of disease with increased number and size of numerous enlarged lymph nodes throughout the lower thorax, abdomen and pelvis, as detailed above. 2. In addition, there are several small pulmonary nodules in the lung bases. The largest of these are clustered in the posterior aspect of the right lower lobe in an apparent area of scarring which is very similar to prior PET-CT 03/12/2016, favored to be benign. However, the smaller nodules which measure up to 6 mm in size are nonspecific and warrant attention on follow-up studies. 3. Aortic atherosclerosis, in addition to least 2 vessel coronary artery disease. Assessment for potential risk factor modification, dietary therapy or pharmacologic therapy may be warranted, if clinically indicated. 4. Additional incidental   findings, as above. 01/18/19 BM Bx revealed normocellular bone marrow with trilineage hematopoiesis Stage II indicated with 01/26/19 CT  Began C1 of 27m Revlimid and Rituxan on 02/05/19  05/05/19 BM Bx revealed normocellular bone marrow with erythroid hyperplasia, and no evidence of lymphoma  05/11/19 PET/CT revealed "Near complete metabolic response to therapy of lymphoma since the prior PET of 11/12/2018. Low-level hypermetabolism at the site of adenopathy within the abdominal retroperitoneum persists. (Deauville) 2. There has also been resolution of abdominopelvic adenopathy since the diagnostic CT of 01/26/2019. 2. Nonspecific right lower lobe pulmonary nodules, similar. 3. Coronary artery atherosclerosis. Aortic Atherosclerosis."   4) History  of right lower extremity distal DVT. Resolved. S/p 6 months of treatment in 02/2016 -Returning to 122mXarelto while taking Revlimid  5) Mild chronic thrombocytopenia-  128k    PLAN:  -Discussed pt labwork today, 07/29/2019; blood counts are stable, blood chemistries are stable -Pt is tolerating 15 mg Revlimid well -The pt has no prohibitive toxicities from continuing C7 Rituximab and Revlimid 1522mO daily at this time. -Recommend warm compresses and massages for his neck pain, which seems to be muscular soreness -Plan to repeat scans in 6 months  -Will see the pt back in 1 month  -Plz schedule next 3 doses of q4weekly Rituxan with labs and MD visit   The total time spent in the appt was 20 minutes and more than 50% was on counseling and direct patient cares.  GauSullivan Lone MS Hematology/Oncology Physician [facility] PointOffice):       [phone] cell):  [phone]):           [phone], EmiDe Burrsm acting as a scribe for Dr. KalIrene LimboI have reviewed the above documentation for accuracy and completeness, and I agree with the above. .GaBrunetta Genera

## 2019-07-29 ENCOUNTER — Other Ambulatory Visit: Payer: Self-pay

## 2019-07-29 ENCOUNTER — Inpatient Hospital Stay (HOSPITAL_BASED_OUTPATIENT_CLINIC_OR_DEPARTMENT_OTHER): Payer: No Typology Code available for payment source | Admitting: Hematology

## 2019-07-29 ENCOUNTER — Inpatient Hospital Stay: Payer: No Typology Code available for payment source

## 2019-07-29 ENCOUNTER — Telehealth: Payer: Self-pay | Admitting: *Deleted

## 2019-07-29 ENCOUNTER — Telehealth: Payer: Self-pay | Admitting: Hematology

## 2019-07-29 ENCOUNTER — Inpatient Hospital Stay: Payer: No Typology Code available for payment source | Attending: Hematology

## 2019-07-29 VITALS — BP 130/64 | HR 52 | Temp 97.7°F | Resp 16

## 2019-07-29 VITALS — BP 143/72 | HR 58 | Temp 98.5°F | Resp 17 | Ht 73.0 in | Wt 224.0 lb

## 2019-07-29 DIAGNOSIS — Z5112 Encounter for antineoplastic immunotherapy: Secondary | ICD-10-CM | POA: Insufficient documentation

## 2019-07-29 DIAGNOSIS — C8330 Diffuse large B-cell lymphoma, unspecified site: Secondary | ICD-10-CM

## 2019-07-29 DIAGNOSIS — Z95828 Presence of other vascular implants and grafts: Secondary | ICD-10-CM

## 2019-07-29 DIAGNOSIS — C8298 Follicular lymphoma, unspecified, lymph nodes of multiple sites: Secondary | ICD-10-CM

## 2019-07-29 LAB — CBC WITH DIFFERENTIAL/PLATELET
Abs Immature Granulocytes: 0.01 10*3/uL (ref 0.00–0.07)
Basophils Absolute: 0.1 10*3/uL (ref 0.0–0.1)
Basophils Relative: 2 %
Eosinophils Absolute: 0.1 10*3/uL (ref 0.0–0.5)
Eosinophils Relative: 3 %
HCT: 41 % (ref 39.0–52.0)
Hemoglobin: 14.2 g/dL (ref 13.0–17.0)
Immature Granulocytes: 0 %
Lymphocytes Relative: 26 %
Lymphs Abs: 1 10*3/uL (ref 0.7–4.0)
MCH: 30.5 pg (ref 26.0–34.0)
MCHC: 34.6 g/dL (ref 30.0–36.0)
MCV: 88 fL (ref 80.0–100.0)
Monocytes Absolute: 0.5 10*3/uL (ref 0.1–1.0)
Monocytes Relative: 14 %
Neutro Abs: 2.2 10*3/uL (ref 1.7–7.7)
Neutrophils Relative %: 55 %
Platelets: 128 10*3/uL — ABNORMAL LOW (ref 150–400)
RBC: 4.66 MIL/uL (ref 4.22–5.81)
RDW: 13.8 % (ref 11.5–15.5)
WBC: 4 10*3/uL (ref 4.0–10.5)
nRBC: 0 % (ref 0.0–0.2)

## 2019-07-29 LAB — CMP (CANCER CENTER ONLY)
ALT: 37 U/L (ref 0–44)
AST: 32 U/L (ref 15–41)
Albumin: 3.9 g/dL (ref 3.5–5.0)
Alkaline Phosphatase: 80 U/L (ref 38–126)
Anion gap: 11 (ref 5–15)
BUN: 19 mg/dL (ref 8–23)
CO2: 23 mmol/L (ref 22–32)
Calcium: 8.9 mg/dL (ref 8.9–10.3)
Chloride: 108 mmol/L (ref 98–111)
Creatinine: 0.95 mg/dL (ref 0.61–1.24)
GFR, Est AFR Am: >60 mL/min (ref >60)
GFR, Estimated: >60 mL/min (ref >60)
Glucose, Bld: 97 mg/dL (ref 70–99)
Potassium: 4.4 mmol/L (ref 3.5–5.1)
Sodium: 142 mmol/L (ref 135–145)
Total Bilirubin: 0.6 mg/dL (ref 0.3–1.2)
Total Protein: 6.2 g/dL — ABNORMAL LOW (ref 6.5–8.1)

## 2019-07-29 MED ORDER — HEPARIN SOD (PORK) LOCK FLUSH 100 UNIT/ML IV SOLN
500.0000 [IU] | Freq: Once | INTRAVENOUS | Status: AC | PRN
  Administered 2019-07-29: 500 [IU]
  Filled 2019-07-29: qty 5

## 2019-07-29 MED ORDER — SODIUM CHLORIDE 0.9 % IV SOLN
Freq: Once | INTRAVENOUS | Status: AC
  Administered 2019-07-29: 10:00:00 via INTRAVENOUS
  Filled 2019-07-29: qty 250

## 2019-07-29 MED ORDER — SODIUM CHLORIDE 0.9 % IV SOLN
375.0000 mg/m2 | Freq: Once | INTRAVENOUS | Status: AC
  Administered 2019-07-29: 900 mg via INTRAVENOUS
  Filled 2019-07-29: qty 50

## 2019-07-29 MED ORDER — SODIUM CHLORIDE 0.9% FLUSH
10.0000 mL | INTRAVENOUS | Status: DC | PRN
  Administered 2019-07-29: 10 mL
  Filled 2019-07-29: qty 10

## 2019-07-29 MED ORDER — METHYLPREDNISOLONE SODIUM SUCC 125 MG IJ SOLR
INTRAMUSCULAR | Status: AC
  Filled 2019-07-29: qty 2

## 2019-07-29 MED ORDER — DIPHENHYDRAMINE HCL 25 MG PO CAPS
ORAL_CAPSULE | ORAL | Status: AC
  Filled 2019-07-29: qty 2

## 2019-07-29 MED ORDER — METHYLPREDNISOLONE SODIUM SUCC 125 MG IJ SOLR
125.0000 mg | Freq: Once | INTRAMUSCULAR | Status: AC
  Administered 2019-07-29: 125 mg via INTRAVENOUS

## 2019-07-29 MED ORDER — ACETAMINOPHEN 325 MG PO TABS
650.0000 mg | ORAL_TABLET | Freq: Once | ORAL | Status: AC
  Administered 2019-07-29: 650 mg via ORAL

## 2019-07-29 MED ORDER — DIPHENHYDRAMINE HCL 25 MG PO CAPS
50.0000 mg | ORAL_CAPSULE | Freq: Once | ORAL | Status: AC
  Administered 2019-07-29: 50 mg via ORAL

## 2019-07-29 MED ORDER — SODIUM CHLORIDE 0.9% FLUSH
10.0000 mL | Freq: Once | INTRAVENOUS | Status: AC
  Administered 2019-07-29: 10 mL
  Filled 2019-07-29: qty 10

## 2019-07-29 MED ORDER — ACETAMINOPHEN 325 MG PO TABS
ORAL_TABLET | ORAL | Status: AC
  Filled 2019-07-29: qty 2

## 2019-07-29 MED ORDER — RIVAROXABAN 10 MG PO TABS: 10.0000 mg | ORAL_TABLET | Freq: Every day | ORAL | 5 refills | Status: DC

## 2019-07-29 NOTE — Telephone Encounter (Signed)
Faxed Xarelto refill to [place] at [facility]. [place] [phone]. Fax confirmation received.

## 2019-07-29 NOTE — Patient Instructions (Signed)
[facility] Discharge Instructions for Patients Receiving Chemotherapy  Today you received the following chemotherapy agents Rituxan  To help prevent nausea and vomiting after your treatment, we encourage you to take your nausea medication as directed by your MD.   If you develop nausea and vomiting that is not controlled by your nausea medication, call the clinic.   BELOW ARE SYMPTOMS THAT SHOULD BE REPORTED IMMEDIATELY:  *FEVER GREATER THAN 100.5 F  *CHILLS WITH OR WITHOUT FEVER  NAUSEA AND VOMITING THAT IS NOT CONTROLLED WITH YOUR NAUSEA MEDICATION  *UNUSUAL SHORTNESS OF BREATH  *UNUSUAL BRUISING OR BLEEDING  TENDERNESS IN MOUTH AND THROAT WITH OR WITHOUT PRESENCE OF ULCERS  *URINARY PROBLEMS  *BOWEL PROBLEMS  UNUSUAL RASH Items with * indicate a potential emergency and should be followed up as soon as possible.  Feel free to call the clinic should you have any questions or concerns. The clinic phone number is [phone].  Please show the CHEMO ALERT CARD at check-in to the Emergency Department and triage nurse.  Coronavirus (COVID-19) Are you at risk?  Are you at risk for the Coronavirus (COVID-19)?  To be considered HIGH RISK for Coronavirus (COVID-19), you have to meet the following criteria:  . Traveled to [place], [place], [place], [place] or [place]; or in the [place] to [place], [place], [place], or [place]; and have fever, cough, and shortness of breath within the last 2 weeks of travel OR . Been in close contact with a person diagnosed with COVID-19 within the last 2 weeks and have fever, cough, and shortness of breath . IF YOU DO NOT MEET THESE CRITERIA, YOU ARE CONSIDERED LOW RISK FOR COVID-19.  What to do if you are HIGH RISK for COVID-19?  . If you are having a medical emergency, call 911. . Seek medical care right away. Before you go to a doctor's office, urgent care or emergency department, call ahead and tell them about  your recent travel, contact with someone diagnosed with COVID-19, and your symptoms. You should receive instructions from your physician's office regarding next steps of care.  . When you arrive at healthcare provider, tell the healthcare staff immediately you have returned from visiting [place], [place], [place], [place] or [place]; or traveled in the [place] to [place], [place], [place], or [place]; in the last two weeks or you have been in close contact with a person diagnosed with COVID-19 in the last 2 weeks.   . Tell the health care staff about your symptoms: fever, cough and shortness of breath. . After you have been seen by a medical provider, you will be either: o Tested for (COVID-19) and discharged home on quarantine except to seek medical care if symptoms worsen, and asked to  - Stay home and avoid contact with others until you get your results (4-5 days)  - Avoid travel on public transportation if possible (such as bus, train, or airplane) or o Sent to the Emergency Department by EMS for evaluation, COVID-19 testing, and possible admission depending on your condition and test results.  What to do if you are LOW RISK for COVID-19?  Reduce your risk of any infection by using the same precautions used for avoiding the common cold or flu:  . Wash your hands often with soap and warm water for at least 20 seconds.  If soap and water are not readily available, use an alcohol-based hand sanitizer with at least 60% alcohol.  . If   coughing or sneezing, cover your mouth and nose by coughing or sneezing into the elbow areas of your shirt or coat, into a tissue or into your sleeve (not your hands). . Avoid shaking hands with others and consider head nods or verbal greetings only. . Avoid touching your eyes, nose, or mouth with unwashed hands.  . Avoid close contact with people who are sick. . Avoid places or events with large numbers of people in one location, like concerts or sporting  events. . Carefully consider travel plans you have or are making. . If you are planning any travel outside or inside the [place], visit the CDC's Travelers' Health webpage for the latest health notices. . If you have some symptoms but not all symptoms, continue to monitor at home and seek medical attention if your symptoms worsen. . If you are having a medical emergency, call 911.   ADDITIONAL HEALTHCARE OPTIONS FOR PATIENTS  [place] [place] [facility] / e-Visit: [email or web address]         [facility]: [phone]  [facility]: [phone]                   [facility]: [phone]    

## 2019-07-29 NOTE — Telephone Encounter (Signed)
Scheduled appt per 8/6 los. °

## 2019-07-29 NOTE — Telephone Encounter (Signed)
Patient called. Requested refill of Xarelto. Needs it faxed to [facility] in [phone] with office cover sheet stating referral #: [place] [id number]. Refilled per Dr.Kale OV note 07/29/2019.

## 2019-08-10 ENCOUNTER — Other Ambulatory Visit: Payer: Self-pay | Admitting: *Deleted

## 2019-08-10 ENCOUNTER — Telehealth: Payer: Self-pay | Admitting: *Deleted

## 2019-08-10 MED ORDER — LENALIDOMIDE 15 MG PO CAPS: 15.0000 mg | ORAL_CAPSULE | Freq: Every day | ORAL | 0 refills | Status: DC

## 2019-08-10 NOTE — Telephone Encounter (Signed)
Refilled Revlimid 15 mg per Dr. Irene Limbo OV note 07/29/2019 Printed/signed prescription faxed to [facility]) [phone] with [place] cover sheet stating [place] [id number], [place] and Medication list attached. [employer] Auth# [id number], 08/10/2019

## 2019-08-10 NOTE — Telephone Encounter (Signed)
Faxed Revlimid prescription to [facility] @ [address] [place], [place].  Fax confirmation received

## 2019-08-25 NOTE — Progress Notes (Signed)
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 Cytogenetics and Fish showed BCL 2 positivity but negative for BCL 6 and cMYC CD10 positivity suggestive of possibly GCB subtype.  Patient received first cycle of R CHOP on 08/08/2015 and intrathecal methotrexate with hydrocortisone on 08/09/2015. Patient subsequently was admitted with ARDS likely due to neulasta -treated with high dose steroids and empirically with broad spectrum antibiotics.  2nd cycle of chemotherapy delayed to allow for rehabilitation (was due on 08/31/2015).  Received R-mini-CHOP for cycle 2 and tolerated it without significant cytopenias. Was admitted briefly for a mild lower extremity cellulitis and DVT. Cellulitis now resolved. Patient completing his oral antibiotics.  PET/CT scan on 09/29/2015 with no evidence of disease progression. Good response in his  mesenteric lymph nodes. Inguinal lymph nodes on the right side. Decreased uptake in his C5 level.  No new lesions noted.   Cycle 3 (10/02/2015)  of R- CHOP ( we increased the doxorubicin dose back to 50 mg/m and keep the cyclophosphamide dose reduction at 400 mg/m]  Cycle 4 (10/23/2015) of R-CHOP ( we increased the doxorubicin dose back to 50 mg/m and kept the cyclophosphamide dose reduction at 400 mg/m].  PET/CT 11/10/2015: Shows improvement in most lesions but is noted to have a new FDG avid lymph node in the mesentery of the small bowel.  Cycle 5 on 11/13/2015 R CHOP - received full dose.  Cycle 6 on 12/11/2015 R-CHOP full dose.  Admitted with neutropenic fevers and treatment for possible early pneumonia.  This resolved and he was able to discharge home  01/09/2016 through 01/22/2016: The patient was treated to the C4-C6 levels of the spine to a dose of 30 gray in 10 fractions using a 2 field technique. The patient was also treated to an abdominal lymph node region to a dose of 30 gray in 10 fractions using a 3 field technique. This treatment consisted of a 3-D conformal technique with daily image guidance utilized.   Repeat PET CT scan in March 12 2016 -showed some enlarged mesenteric lymph nodes which were FDG avid.  CT guided FNA of mesenteric lymph nodes at [facility] on 03/29/2016 - showed scant cellularity. No overt evidence of lymphoma.  Rpt PET/CT 05/15/2016 at [facility]- show similar appearing enlarged mesenteric lymph nodes which are FDG avid. No change in size and no other evidence of disease progression.  PET/CT 07/24/2016 - and [facility] shows no evidence of disease  progression  PET/CT 01/22/2017:  Result Impression   1.Stable low-level metabolic activity in small lymph nodes in the small bowel mesentery. No new FDG avid disease. 2.Persistent hypermetabolic soft tissue thickening overlying the coccyx which could relate to a  decubitus ulcer and should be amenable to direct inspection. 3.Stable pulmonary nodules which do not appear hypermetabolic, however, several are below the size resolution of PET in size. Continued attention on follow-up is suggested. 4.Previously seen hypermetabolic skin thickening near the right ear has resolved. 5.Ancillary CT findings as above.    INTERVAL HISTORY:   Ethan Stewart is here for management and evaluation of [name], grade 1-2. The patient's last visit with [place] was on 07/29/2019. The pt reports that he is doing well overall.  He continues on Rituximab; today is day 1 cycle 11. He also continues on Revlimid 62m PO daily. The pt has no prohibitive toxicities from continuing these treatments at this time.   The pt reports no acute new symptoms. No fevers, chills, night sweats, weight loss no new overt LN's  Lab results today (08/26/19) of CBC w/diff and CMP is as follows: all values are WNL except for WBC at 3.1, Platelets at 141, Neutro Abs at 1.3, Glucose at 103, Calcium at 8.7, and Total Protein at 5.9.   REVIEW OF SYSTEMS:   A 10+ POINT REVIEW OF SYSTEMS WAS OBTAINED including neurology, dermatology, psychiatry, cardiac, respiratory, lymph, extremities, GI, GU, Musculoskeletal, constitutional, breasts, reproductive, HEENT.  All pertinent positives are noted in the HPI.  All others are negative.    ALLERGIES:  is allergic to neulasta [pegfilgrastim].  MEDICATIONS:  Current Outpatient Medications  Medication Sig Dispense Refill  . diazepam (VALIUM) 2 MG tablet Take 2 mg by mouth daily as needed for anxiety.    .Ethan Stewart (NORCO) 5-325 MG tablet Take 1-2 tablets by mouth every 6 (six) hours as needed for moderate pain or severe pain. (Patient not taking: Reported on 12/17/2018) 20 tablet 0  . lenalidomide (REVLIMID) 15 MG capsule Take 1 capsule (15 mg total) by mouth daily. 3 weeks on 1 week off. 21 capsule 0  .  levothyroxine (SYNTHROID, LEVOTHROID) 88 MCG tablet Take 88 mcg by mouth daily before breakfast.   2  . lidocaine-prilocaine (EMLA) cream Apply 1 application topically as needed (for port access).    . Multiple Vitamin (MULTIVITAMIN WITH MINERALS) TABS tablet Take 1 tablet by mouth daily.     . ondansetron (ZOFRAN) 8 MG tablet Take 1 tablet (8 mg total) by mouth every 8 (eight) hours as needed for nausea or vomiting. 30 tablet 1  . prochlorperazine (COMPAZINE) 10 MG tablet Take 1 tablet (10 mg total) by mouth every 8 (eight) hours as needed for nausea or vomiting. 30 tablet 0  . rivaroxaban (XARELTO) 10 MG TABS tablet Take 1 tablet (10 mg total) by mouth daily. 30 tablet 5  . senna (SENOKOT) 8.6 MG TABS tablet Take 2 tablets by mouth at bedtime.    . simvastatin (ZOCOR) 20 MG tablet Take 20 mg by mouth daily.    . temazepam (RESTORIL) 15 MG capsule Take 15 mg by mouth at bedtime as needed for sleep.      No current facility-administered medications for this visit.     PHYSICAL EXAMINATION: ECOG PERFORMANCE STATUS: 1 - Symptomatic but completely ambulatory  Vitals:   08/26/19 0902  BP: 140/77  Pulse: (!) 51  Resp: 18  Temp: 97.6 F (36.4 C)  SpO2: 100%  Filed Weights   08/26/19 0902  Weight: 224 lb 8 oz (101.8 kg)     GENERAL:alert, in no acute distress and comfortable SKIN: no acute rashes, no significant lesions EYES: conjunctiva are pink and non-injected, sclera anicteric OROPHARYNX: MMM, no exudates, no oropharyngeal erythema or ulceration NECK: supple, no JVD LYMPH:  no palpable lymphadenopathy in the cervical, axillary or inguinal regions LUNGS: clear to auscultation b/l with normal respiratory effort HEART: regular rate & rhythm ABDOMEN:  normoactive bowel sounds , non tender, not distended. Extremity: no pedal edema PSYCH: alert & oriented x 3 with fluent speech NEURO: no focal motor/sensory deficits    LABORATORY DATA:   CBC Latest Ref Rng & Units 08/26/2019  07/29/2019 07/08/2019  WBC 4.0 - 10.5 K/uL 3.1(L) 4.0 4.1  Hemoglobin 13.0 - 17.0 g/dL 13.5 14.2 14.5  Hematocrit 39.0 - 52.0 % 39.1 41.0 43.5  Platelets 150 - 400 K/uL 141(L) 128(L) 112(L)     CMP Latest Ref Rng & Units 08/26/2019 07/29/2019 07/08/2019  Glucose 70 - 99 mg/dL 103(H) 97 87  BUN 8 - 23 mg/dL _0 Creatinine 0.61 - 1.24 mg/dL 0.98 0.95 1.03  Sodium 135 - 145 mmol/L 142 142 143  Potassium 3.5 - 5.1 mmol/L 4.2 4.4 4.1  Chloride 98 - 111 mmol/L 109 108 107  CO2 22 - 32 mmol/L _1 Calcium 8.9 - 10.3 mg/dL 8.7(L) 8.9 8.6(L)  Total Protein 6.5 - 8.1 g/dL 5.9(L) 6.2(L) 6.3(L)  Total Bilirubin 0.3 - 1.2 mg/dL 0.5 0.6 0.6  Alkaline Phos 38 - 126 U/L 85 80 80  AST 15 - 41 U/L 30 32 25  ALT 0 - 44 U/L 35 37 29   . Lab Results  Component Value Date   LDH 210 (H) 06/10/2019    Fine Needle Aspiration, Lymph NodeResulted: 12/04/2018 1:14 PM [facility] Result Narrative  ACCESSION NUMBER: [id number] RECEIVED: 11/27/2018 ORDERING PHYSICIAN: RAKHEE Megan Mans , MD PATIENT NAME: Ethan Stewart * Amended * Final Cytologic Interpretation AMENDED Stewart  AMENDED 12/04/2018 TO ADD DIAGNOSIS   A. Right inguinal lymph node, Fine Needle Aspiration II (smears and cell block): B-cell lymphoma with germinal center phenotype (see comment)  Specimen Adequacy: Satisfactory for evaluation.  B. Cytology core biopsy: B-cell lymphoma with germinal center phenotype (see comment)  Specimen Adequacy: Satisfactory for evaluation.    COMMENT:Sections of the core biopsy demonstrate an atypical vaguely nodular lymphoid infiltrate composed of medium sized lymphoid cells with mature chromatin, irregular nuclear membranes and scant cytoplasm. Focally some areas show larger cells. Few mitotic figures are present. Appropriately controlled immunohistochemical stains are performed. Sheets of B-cells are highlighted by  CD20. These B-cells co-express BCL2 and BCL6 (focal, nodular distribution). They are negative for CD30 and MYC. CD21 demonstrates few residual disrupted follicular dendritic meshworks. The proliferation index is low (10-20%), as demonstrated by Ki-67. Scattered small T-cells are highlighted by CD3. Concurrent flow cytometry identified a kappa light chain restricted B-cell population, co-expressing CD10.  Overall, the findings are consistent with involvement by a B-cell lymphoma with a germinal center phenotype (GC-type), with focal follicular architecture and a low proliferation index. Given the nature of the sample (i.e. core biopsy) a final architectural subclassification is not possible. If clinically feasible, an excisional biopsy is recommended.  Correlation with clinical and cytogenetic/molecular data is recommended.    12/11/18 Inguinal LN Biopsy  Interpretation Tissue-Flow Cytometry - MONOCLONAL B-CELL POPULATION EXPRESSING CD10 COMPRISES 92% OF ALL LYMPHOCYTES -  SEE COMMENT Microscopic Gated population: Flow cytometric immunophenotyping is performed using antibiodies to the antigens listed in the table below. Electronic gates are placed around a cell cluster displaying light scatter properties corresponding to lymphocytes. - Abnormal Cells in gated population: 92 % - Phenotype of Abnormal Cells: CD10, CD19, CD20, CD21, CD22, HLA-Dr, Kappa Diagnosis Lymph node for lymphoma, Deep Right Inguinal - FOLLICULAR LYMPHOMA, GRADE 1-2 - SEE COMMENT Microscopic Comment The nodal architecture is effaced by a proliferation back to back follicles composed of medium to large lymphocytes with irregular, cleaved nuclei and scattered centroblasts. There are no diffuse areas of growth present within the examined nodal material. Immunohistochemistry performed on the tissue highlights a nodular B-cell proliferation with expression of CD20, CD10, bcl-6, and Bcl-2. CD21 highlights expanded  follicular dendritic meshworks. The Ki-67 shows a slightly increased proliferation rate of 30-40%. The atypical lymphocytes are negative for CD5. CD3 immunohistochemistry highlights the background T-cells. Flow cytometry was performed. A monoclonal B-cell populaton expressing CD10 comprises 92% of all lymphocytes (See [phone]). Overall, the findings are consistent with a diagnosis of low grade follicular lymphoma, Grade 1-2. Of note, the increased ki-67 may portend a more aggressive course despite the low histologic grade.   01/18/19 BM Biopsy:   05/05/19 BM Biopsy:     RADIOGRAPHIC STUDIES: I have personally reviewed the radiological images as listed and agreed with the findings in the Stewart. No results found.    ASSESSMENT & PLAN:   1) Diffuse large B-cell lymphoma stage IV AE with involvement of mesenteric, inguinal lymph nodes and C5 vertebral body with spinal cord impingement  Patient is status post six cycles of R-CHOP and R-mini-CHOP, completed in 2016, as noted above.  2) History of capillary leak syndrome with ARDS requiring intubation likely due to G-CSF.  3) Recently diagnosed Follicular Lymphoma, Grade 1-2   11/02/18 CT A/P which revealed Negative for inguinal or bowel hernia but positive for bulky right inguinal and external iliac lymphadenopathy which is new since a 2017 PET-CT and highly suspicious for recurrent Lymphoma. This would be amenable to Ultrasound-guided needle biopsy. 2. No abdominal or contralateral left hemi pelvic lymphadenopathy. No other acute findings identified in the abdomen or pelvis. 3. Chronic pneumobilia.  Aortic Atherosclerosis.  11/12/18 PET/CT which revealed  Interval development of multistation lymphadenopathy in the abdomen and pelvis as detailed above- Deauville 5. 2. Presence of pneumobilia could reflect sphincter of Oddi dysfunction versus recent procedure. Correlate with clinical history and upcoming comprehensive metabolic panel.  3. Ancillary CT findings   11/27/18 US guided LN Biopsy pathology Stewart is as noted above. " B-cell lymphoma with a germinal center phenotype (GC-type), with focal follicular architecture and a low proliferation index. Given the nature of the sample (i.e. core biopsy) a final architectural subclassification is not possible. If clinically feasible, an excisional biopsy is recommended."  12/11/18 Right Inguinal LN biopsy revealed Grade 1-2 Follicular Lymphoma  05/31/66 CT A/P revealed Today's study demonstrates clear progression of disease with increased number and size of numerous enlarged lymph nodes throughout the lower thorax, abdomen and pelvis, as detailed above. 2. In addition, there are several small pulmonary nodules in the lung bases. The largest of these are clustered in the posterior aspect of the right lower lobe in an apparent area of scarring which is very similar to prior PET-CT 03/12/2016, favored to be benign. However, the smaller nodules which measure up to 6 mm in size are nonspecific and warrant attention on follow-up studies. 3. Aortic atherosclerosis, in addition  to least 2 vessel coronary artery disease. Assessment for potential risk factor modification, dietary therapy or pharmacologic therapy may be warranted, if clinically indicated. 4. Additional incidental findings, as above. 01/18/19 BM Bx revealed normocellular bone marrow with trilineage hematopoiesis Stage II indicated with 01/26/19 CT  Began C1 of 75m Revlimid and Rituxan on 02/05/19  05/05/19 BM Bx revealed normocellular bone marrow with erythroid hyperplasia, and no evidence of lymphoma  05/11/19 PET/CT revealed "Near complete metabolic response to therapy of lymphoma since the prior PET of 11/12/2018. Low-level hypermetabolism at the site of adenopathy within the abdominal retroperitoneum persists. ([place]au[place]) 2. There has also been resolution of abdominopelvic adenopathy since the diagnostic CT of 01/26/2019. 2.  Nonspecific right lower lobe pulmonary nodules, similar. 3. Coronary artery atherosclerosis. Aortic Atherosclerosis."   4) History of right lower extremity distal DVT. Resolved. S/p 6 months of treatment in 02/2016 -Returning to 118mXarelto while taking Revlimid  5) Mild chronic thrombocytopenia-  128k    PLAN:  -Discussed pt labwork today, 08/25/19; all values are WNL except for WBC at 3.1, Platelets at 141, Neutro Abs at 1.3, Glucose at 103, Calcium at 8.7, and Total Protein at 5.9. -The pt has no prohibitive toxicities from continuing C11 Rituximab and Revlimid 1547mO 3w on 1 w off  at this time. -no lab or clinical evidence of lymphoma progression at this time.   FOLLOW UP: F/u as per scheduled appointments on 10/1   The total time spent in the appt was 20 minutes and more than 50% was on counseling and direct patient cares.  All of the patient's questions were answered with apparent satisfaction. The patient knows to call the clinic with any problems, questions or concerns. .  Sullivan Lone MS Miltonmatology/Oncology Physician ConPolaris Surgery CenterOffice):       [phone] cell):  [phone]):           [phone], AmbJacqualyn Poseym acting as a scribe for Dr. GauSullivan Lone .I have reviewed the above documentation for accuracy and completeness, and I agree with the above. .GaBrunetta Genera

## 2019-08-26 ENCOUNTER — Inpatient Hospital Stay: Payer: No Typology Code available for payment source

## 2019-08-26 ENCOUNTER — Telehealth: Payer: Self-pay | Admitting: Hematology

## 2019-08-26 ENCOUNTER — Inpatient Hospital Stay (HOSPITAL_BASED_OUTPATIENT_CLINIC_OR_DEPARTMENT_OTHER): Payer: No Typology Code available for payment source | Admitting: Hematology

## 2019-08-26 ENCOUNTER — Other Ambulatory Visit: Payer: Self-pay

## 2019-08-26 ENCOUNTER — Inpatient Hospital Stay: Payer: No Typology Code available for payment source | Attending: Hematology

## 2019-08-26 VITALS — BP 140/77 | HR 51 | Temp 97.6°F | Resp 18 | Ht 73.0 in | Wt 224.5 lb

## 2019-08-26 VITALS — BP 117/64 | HR 54 | Temp 97.8°F | Resp 16

## 2019-08-26 DIAGNOSIS — C8330 Diffuse large B-cell lymphoma, unspecified site: Secondary | ICD-10-CM

## 2019-08-26 DIAGNOSIS — C8298 Follicular lymphoma, unspecified, lymph nodes of multiple sites: Secondary | ICD-10-CM | POA: Insufficient documentation

## 2019-08-26 DIAGNOSIS — C8238 Follicular lymphoma grade IIIa, lymph nodes of multiple sites: Secondary | ICD-10-CM | POA: Diagnosis not present

## 2019-08-26 DIAGNOSIS — Z5112 Encounter for antineoplastic immunotherapy: Secondary | ICD-10-CM | POA: Insufficient documentation

## 2019-08-26 DIAGNOSIS — Z86718 Personal history of other venous thrombosis and embolism: Secondary | ICD-10-CM | POA: Diagnosis not present

## 2019-08-26 DIAGNOSIS — Z95828 Presence of other vascular implants and grafts: Secondary | ICD-10-CM

## 2019-08-26 LAB — CBC WITH DIFFERENTIAL/PLATELET
Abs Immature Granulocytes: 0 10*3/uL (ref 0.00–0.07)
Basophils Absolute: 0.1 10*3/uL (ref 0.0–0.1)
Basophils Relative: 2 %
Eosinophils Absolute: 0.1 10*3/uL (ref 0.0–0.5)
Eosinophils Relative: 3 %
HCT: 39.1 % (ref 39.0–52.0)
Hemoglobin: 13.5 g/dL (ref 13.0–17.0)
Immature Granulocytes: 0 %
Lymphocytes Relative: 32 %
Lymphs Abs: 1 10*3/uL (ref 0.7–4.0)
MCH: 30.2 pg (ref 26.0–34.0)
MCHC: 34.5 g/dL (ref 30.0–36.0)
MCV: 87.5 fL (ref 80.0–100.0)
Monocytes Absolute: 0.6 10*3/uL (ref 0.1–1.0)
Monocytes Relative: 20 %
Neutro Abs: 1.3 10*3/uL — ABNORMAL LOW (ref 1.7–7.7)
Neutrophils Relative %: 43 %
Platelets: 141 10*3/uL — ABNORMAL LOW (ref 150–400)
RBC: 4.47 MIL/uL (ref 4.22–5.81)
RDW: 13.7 % (ref 11.5–15.5)
WBC: 3.1 10*3/uL — ABNORMAL LOW (ref 4.0–10.5)
nRBC: 0 % (ref 0.0–0.2)

## 2019-08-26 LAB — CMP (CANCER CENTER ONLY)
ALT: 35 U/L (ref 0–44)
AST: 30 U/L (ref 15–41)
Albumin: 3.9 g/dL (ref 3.5–5.0)
Alkaline Phosphatase: 85 U/L (ref 38–126)
Anion gap: 7 (ref 5–15)
BUN: 21 mg/dL (ref 8–23)
CO2: 26 mmol/L (ref 22–32)
Calcium: 8.7 mg/dL — ABNORMAL LOW (ref 8.9–10.3)
Chloride: 109 mmol/L (ref 98–111)
Creatinine: 0.98 mg/dL (ref 0.61–1.24)
GFR, Est AFR Am: >60 mL/min (ref >60)
GFR, Estimated: >60 mL/min (ref >60)
Glucose, Bld: 103 mg/dL — ABNORMAL HIGH (ref 70–99)
Potassium: 4.2 mmol/L (ref 3.5–5.1)
Sodium: 142 mmol/L (ref 135–145)
Total Bilirubin: 0.5 mg/dL (ref 0.3–1.2)
Total Protein: 5.9 g/dL — ABNORMAL LOW (ref 6.5–8.1)

## 2019-08-26 MED ORDER — METHYLPREDNISOLONE SODIUM SUCC 125 MG IJ SOLR
125.0000 mg | Freq: Once | INTRAMUSCULAR | Status: AC
  Administered 2019-08-26: 125 mg via INTRAVENOUS

## 2019-08-26 MED ORDER — METHYLPREDNISOLONE SODIUM SUCC 125 MG IJ SOLR
INTRAMUSCULAR | Status: AC
  Filled 2019-08-26: qty 2

## 2019-08-26 MED ORDER — DIPHENHYDRAMINE HCL 25 MG PO CAPS
50.0000 mg | ORAL_CAPSULE | Freq: Once | ORAL | Status: AC
  Administered 2019-08-26: 50 mg via ORAL

## 2019-08-26 MED ORDER — SODIUM CHLORIDE 0.9% FLUSH
10.0000 mL | Freq: Once | INTRAVENOUS | Status: AC
  Administered 2019-08-26: 10 mL
  Filled 2019-08-26: qty 10

## 2019-08-26 MED ORDER — HEPARIN SOD (PORK) LOCK FLUSH 100 UNIT/ML IV SOLN
500.0000 [IU] | Freq: Once | INTRAVENOUS | Status: AC | PRN
  Administered 2019-08-26: 500 [IU]
  Filled 2019-08-26: qty 5

## 2019-08-26 MED ORDER — DIPHENHYDRAMINE HCL 25 MG PO CAPS
ORAL_CAPSULE | ORAL | Status: AC
  Filled 2019-08-26: qty 2

## 2019-08-26 MED ORDER — SODIUM CHLORIDE 0.9 % IV SOLN
Freq: Once | INTRAVENOUS | Status: AC
  Administered 2019-08-26: 10:00:00 via INTRAVENOUS
  Filled 2019-08-26: qty 250

## 2019-08-26 MED ORDER — ACETAMINOPHEN 325 MG PO TABS
ORAL_TABLET | ORAL | Status: AC
  Filled 2019-08-26: qty 2

## 2019-08-26 MED ORDER — SODIUM CHLORIDE 0.9 % IV SOLN
375.0000 mg/m2 | Freq: Once | INTRAVENOUS | Status: AC
  Administered 2019-08-26: 900 mg via INTRAVENOUS
  Filled 2019-08-26: qty 40

## 2019-08-26 MED ORDER — SODIUM CHLORIDE 0.9% FLUSH
10.0000 mL | INTRAVENOUS | Status: DC | PRN
  Administered 2019-08-26: 13:00:00 10 mL
  Filled 2019-08-26: qty 10

## 2019-08-26 MED ORDER — ACETAMINOPHEN 325 MG PO TABS
650.0000 mg | ORAL_TABLET | Freq: Once | ORAL | Status: AC
  Administered 2019-08-26: 650 mg via ORAL

## 2019-08-26 NOTE — Telephone Encounter (Signed)
Per 9/3 los...appts already scheduled.

## 2019-08-26 NOTE — Patient Instructions (Signed)
[facility] Discharge Instructions for Patients Receiving Chemotherapy  Today you received the following chemotherapy agents:  Rituxan.  To help prevent nausea and vomiting after your treatment, we encourage you to take your nausea medication as directed.   If you develop nausea and vomiting that is not controlled by your nausea medication, call the clinic.   BELOW ARE SYMPTOMS THAT SHOULD BE REPORTED IMMEDIATELY:  *FEVER GREATER THAN 100.5 F  *CHILLS WITH OR WITHOUT FEVER  NAUSEA AND VOMITING THAT IS NOT CONTROLLED WITH YOUR NAUSEA MEDICATION  *UNUSUAL SHORTNESS OF BREATH  *UNUSUAL BRUISING OR BLEEDING  TENDERNESS IN MOUTH AND THROAT WITH OR WITHOUT PRESENCE OF ULCERS  *URINARY PROBLEMS  *BOWEL PROBLEMS  UNUSUAL RASH Items with * indicate a potential emergency and should be followed up as soon as possible.  Feel free to call the clinic should you have any questions or concerns. The clinic phone number is [phone].  Please show the CHEMO ALERT CARD at check-in to the Emergency Department and triage nurse.   

## 2019-09-09 ENCOUNTER — Other Ambulatory Visit: Payer: Self-pay | Admitting: *Deleted

## 2019-09-09 MED ORDER — LENALIDOMIDE 15 MG PO CAPS: 15.0000 mg | ORAL_CAPSULE | Freq: Every day | ORAL | 0 refills | Status: DC

## 2019-09-09 NOTE — Telephone Encounter (Signed)
Refill Revlimid per Dr. Irene Limbo OV note 08/26/2019 Refill faxed to [place] at [facility]) [address], [place], [place]. Fax # [phone] with office cover sheet stating referral #: [facility] [id number], [facility] Revlimid [facility] completed/attached and Medication list attached [employer] Auth# [id number], 09/09/2019

## 2019-09-13 ENCOUNTER — Encounter: Payer: Self-pay | Admitting: Hematology

## 2019-09-20 DIAGNOSIS — R69 Illness, unspecified: Secondary | ICD-10-CM | POA: Diagnosis not present

## 2019-09-22 ENCOUNTER — Other Ambulatory Visit: Payer: Self-pay | Admitting: *Deleted

## 2019-09-22 DIAGNOSIS — C8330 Diffuse large B-cell lymphoma, unspecified site: Secondary | ICD-10-CM

## 2019-09-23 ENCOUNTER — Inpatient Hospital Stay (HOSPITAL_BASED_OUTPATIENT_CLINIC_OR_DEPARTMENT_OTHER): Payer: No Typology Code available for payment source | Admitting: Hematology

## 2019-09-23 ENCOUNTER — Other Ambulatory Visit: Payer: Self-pay

## 2019-09-23 ENCOUNTER — Inpatient Hospital Stay: Payer: No Typology Code available for payment source | Attending: Hematology

## 2019-09-23 ENCOUNTER — Inpatient Hospital Stay: Payer: No Typology Code available for payment source

## 2019-09-23 VITALS — BP 140/79 | HR 80 | Temp 98.5°F | Resp 18 | Ht 73.0 in | Wt 222.3 lb

## 2019-09-23 VITALS — BP 118/65 | HR 57 | Temp 97.8°F | Resp 15

## 2019-09-23 DIAGNOSIS — Z5112 Encounter for antineoplastic immunotherapy: Secondary | ICD-10-CM | POA: Insufficient documentation

## 2019-09-23 DIAGNOSIS — Z86718 Personal history of other venous thrombosis and embolism: Secondary | ICD-10-CM | POA: Diagnosis not present

## 2019-09-23 DIAGNOSIS — C8298 Follicular lymphoma, unspecified, lymph nodes of multiple sites: Secondary | ICD-10-CM

## 2019-09-23 DIAGNOSIS — C8330 Diffuse large B-cell lymphoma, unspecified site: Secondary | ICD-10-CM

## 2019-09-23 DIAGNOSIS — C8338 Diffuse large B-cell lymphoma, lymph nodes of multiple sites: Secondary | ICD-10-CM | POA: Diagnosis not present

## 2019-09-23 DIAGNOSIS — Z95828 Presence of other vascular implants and grafts: Secondary | ICD-10-CM

## 2019-09-23 LAB — CMP (CANCER CENTER ONLY)
ALT: 37 U/L (ref 0–44)
AST: 34 U/L (ref 15–41)
Albumin: 4.1 g/dL (ref 3.5–5.0)
Alkaline Phosphatase: 86 U/L (ref 38–126)
Anion gap: 6 (ref 5–15)
BUN: 19 mg/dL (ref 8–23)
CO2: 27 mmol/L (ref 22–32)
Calcium: 8.8 mg/dL — ABNORMAL LOW (ref 8.9–10.3)
Chloride: 108 mmol/L (ref 98–111)
Creatinine: 0.9 mg/dL (ref 0.61–1.24)
GFR, Est AFR Am: >60 mL/min (ref >60)
GFR, Estimated: >60 mL/min (ref >60)
Glucose, Bld: 81 mg/dL (ref 70–99)
Potassium: 4.3 mmol/L (ref 3.5–5.1)
Sodium: 141 mmol/L (ref 135–145)
Total Bilirubin: 0.5 mg/dL (ref 0.3–1.2)
Total Protein: 6.2 g/dL — ABNORMAL LOW (ref 6.5–8.1)

## 2019-09-23 LAB — CBC WITH DIFFERENTIAL (CANCER CENTER ONLY)
Abs Immature Granulocytes: 0 10*3/uL (ref 0.00–0.07)
Basophils Absolute: 0.1 10*3/uL (ref 0.0–0.1)
Basophils Relative: 3 %
Eosinophils Absolute: 0.1 10*3/uL (ref 0.0–0.5)
Eosinophils Relative: 4 %
HCT: 41.5 % (ref 39.0–52.0)
Hemoglobin: 14.3 g/dL (ref 13.0–17.0)
Immature Granulocytes: 0 %
Lymphocytes Relative: 32 %
Lymphs Abs: 1 10*3/uL (ref 0.7–4.0)
MCH: 30.6 pg (ref 26.0–34.0)
MCHC: 34.5 g/dL (ref 30.0–36.0)
MCV: 88.7 fL (ref 80.0–100.0)
Monocytes Absolute: 0.5 10*3/uL (ref 0.1–1.0)
Monocytes Relative: 16 %
Neutro Abs: 1.4 10*3/uL — ABNORMAL LOW (ref 1.7–7.7)
Neutrophils Relative %: 45 %
Platelet Count: 132 10*3/uL — ABNORMAL LOW (ref 150–400)
RBC: 4.68 MIL/uL (ref 4.22–5.81)
RDW: 14 % (ref 11.5–15.5)
WBC Count: 3.1 10*3/uL — ABNORMAL LOW (ref 4.0–10.5)
nRBC: 0 % (ref 0.0–0.2)

## 2019-09-23 MED ORDER — METHYLPREDNISOLONE SODIUM SUCC 125 MG IJ SOLR
INTRAMUSCULAR | Status: AC
  Filled 2019-09-23: qty 2

## 2019-09-23 MED ORDER — SODIUM CHLORIDE 0.9% FLUSH
10.0000 mL | Freq: Once | INTRAVENOUS | Status: AC
  Administered 2019-09-23: 10 mL
  Filled 2019-09-23: qty 10

## 2019-09-23 MED ORDER — SODIUM CHLORIDE 0.9 % IV SOLN
Freq: Once | INTRAVENOUS | Status: AC
  Administered 2019-09-23: 13:00:00 via INTRAVENOUS
  Filled 2019-09-23: qty 250

## 2019-09-23 MED ORDER — HEPARIN SOD (PORK) LOCK FLUSH 100 UNIT/ML IV SOLN
500.0000 [IU] | Freq: Once | INTRAVENOUS | Status: DC | PRN
  Filled 2019-09-23: qty 5

## 2019-09-23 MED ORDER — ACETAMINOPHEN 325 MG PO TABS
ORAL_TABLET | ORAL | Status: AC
  Filled 2019-09-23: qty 2

## 2019-09-23 MED ORDER — DIPHENHYDRAMINE HCL 25 MG PO CAPS
ORAL_CAPSULE | ORAL | Status: AC
  Filled 2019-09-23: qty 2

## 2019-09-23 MED ORDER — ALTEPLASE 2 MG IJ SOLR
2.0000 mg | Freq: Once | INTRAMUSCULAR | Status: AC
  Administered 2019-09-23: 2 mg
  Filled 2019-09-23: qty 2

## 2019-09-23 MED ORDER — DIPHENHYDRAMINE HCL 25 MG PO CAPS
50.0000 mg | ORAL_CAPSULE | Freq: Once | ORAL | Status: AC
  Administered 2019-09-23: 50 mg via ORAL

## 2019-09-23 MED ORDER — SODIUM CHLORIDE 0.9 % IV SOLN
375.0000 mg/m2 | Freq: Once | INTRAVENOUS | Status: AC
  Administered 2019-09-23: 900 mg via INTRAVENOUS
  Filled 2019-09-23: qty 40

## 2019-09-23 MED ORDER — METHYLPREDNISOLONE SODIUM SUCC 125 MG IJ SOLR
125.0000 mg | Freq: Once | INTRAMUSCULAR | Status: AC
  Administered 2019-09-23: 125 mg via INTRAVENOUS

## 2019-09-23 MED ORDER — ACETAMINOPHEN 325 MG PO TABS
650.0000 mg | ORAL_TABLET | Freq: Once | ORAL | Status: AC
  Administered 2019-09-23: 650 mg via ORAL

## 2019-09-23 MED ORDER — ALTEPLASE 2 MG IJ SOLR
INTRAMUSCULAR | Status: AC
  Filled 2019-09-23: qty 2

## 2019-09-23 MED ORDER — SODIUM CHLORIDE 0.9% FLUSH
10.0000 mL | INTRAVENOUS | Status: DC | PRN
  Administered 2019-09-23: 10 mL
  Filled 2019-09-23: qty 10

## 2019-09-23 NOTE — Patient Instructions (Signed)

## 2019-09-23 NOTE — Progress Notes (Signed)
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 Cytogenetics and Fish showed BCL 2 positivity but negative for BCL 6 and cMYC CD10 positivity suggestive of possibly GCB subtype.  Patient received first cycle of R CHOP on 08/08/2015 and intrathecal methotrexate with hydrocortisone on 08/09/2015. Patient subsequently was admitted with ARDS likely due to neulasta -treated with high dose steroids and empirically with broad spectrum antibiotics.  2nd cycle of chemotherapy delayed to allow for rehabilitation (was due on 08/31/2015).  Received R-mini-CHOP for cycle 2 and tolerated it without significant cytopenias. Was admitted briefly for a mild lower extremity cellulitis and DVT. Cellulitis now resolved. Patient completing his oral antibiotics.  PET/CT scan on 09/29/2015 with no evidence of disease progression. Good response in his mesenteric lymph nodes. Inguinal lymph nodes on the right side.  Decreased uptake in his C5 level.  No new lesions noted.   Cycle 3 (10/02/2015)  of R- CHOP ( we increased the doxorubicin dose back to 50 mg/m and keep the cyclophosphamide dose reduction at 400 mg/m]  Cycle 4 (10/23/2015) of R-CHOP ( we increased the doxorubicin dose back to 50 mg/m and kept the cyclophosphamide dose reduction at 400 mg/m].  PET/CT 11/10/2015: Shows improvement in most lesions but is noted to have a new FDG avid lymph node in the mesentery of the small bowel.  Cycle 5 on 11/13/2015 R CHOP - received full dose.  Cycle 6 on 12/11/2015 R-CHOP full dose.  Admitted with neutropenic fevers and treatment for possible early pneumonia.  This resolved and he was able to discharge home  01/09/2016 through 01/22/2016: The patient was treated to the C4-C6 levels of the spine to a dose of 30 gray in 10 fractions using a 2 field technique. The patient was also treated to an abdominal lymph node region to a dose of 30 gray in 10 fractions using a 3 field technique. This treatment consisted of a 3-D conformal technique with daily image guidance utilized.   Repeat PET CT scan in March 12 2016 -showed some enlarged mesenteric lymph nodes which were FDG avid.  CT guided FNA of mesenteric lymph nodes at [facility] on 03/29/2016 - showed scant cellularity. No overt evidence of lymphoma.  Rpt PET/[facility] 05/15/2016 at [facility]- show similar appearing enlarged mesenteric lymph nodes which are FDG avid. No change in size and no other evidence of disease progression.  PET/CT 07/24/2016 - and [facility] shows no evidence of disease progression  PET/CT 01/22/2017:  Result Impression  1.Stable low-level metabolic activity in small lymph nodes in the small bowel mesentery. No new FDG avid disease. 2.Persistent hypermetabolic soft tissue thickening overlying the coccyx which could relate to a decubitus ulcer and should be amenable to direct inspection.  3.Stable pulmonary nodules which do not appear hypermetabolic, however, several are below the size resolution of PET in size. Continued attention on follow-up is suggested. 4.Previously seen hypermetabolic skin thickening near the right ear has resolved. 5.Ancillary CT findings as above.    INTERVAL HISTORY:   Ethan Stewart is here for management and evaluation of [name], grade 1-2. The patient's last visit with [place] was on 08/26/2019. The pt reports that he is doing well overall.  The pt reports that he has been doing well  Lab results today (09/23/19) of CBC w/diff and CMP is as follows: all values are WNL except for WBC Count at 3.1, Platelet Count at 132, Neutro Abs at 1.4, Calcium at 8.8, and Total Protein at 6.2.  On review of systems, pt reports constipation, neck pain and denies fevers,chills weight loss, rashes, infection issues and any other symptoms.   REVIEW OF SYSTEMS:    A 10+ POINT REVIEW OF SYSTEMS WAS OBTAINED including neurology, dermatology, psychiatry, cardiac, respiratory, lymph, extremities, GI, GU, Musculoskeletal, constitutional, breasts, reproductive, HEENT.  All pertinent positives are noted in the HPI.  All others are negative.    ALLERGIES:  is allergic to neulasta [pegfilgrastim].  MEDICATIONS:  Current Outpatient Medications  Medication Sig Dispense Refill  . diazepam (VALIUM) 2 MG tablet Take 2 mg by mouth daily as needed for anxiety.    Ethan Stewart HYDROcodone-acetaminophen (NORCO) 5-325 MG tablet Take 1-2 tablets by mouth every 6 (six) hours as needed for moderate pain or severe pain. (Patient not taking: Reported on 12/17/2018) 20 tablet 0  . lenalidomide (REVLIMID) 15 MG capsule Take 1 capsule (15 mg total) by mouth daily. 3 weeks on 1 week off. 21 capsule 0  . levothyroxine (SYNTHROID, LEVOTHROID) 88 MCG tablet Take 88 mcg by mouth daily before breakfast.   2  . lidocaine-prilocaine (EMLA) cream Apply 1 application topically  as needed (for port access).    . Multiple Vitamin (MULTIVITAMIN WITH MINERALS) TABS tablet Take 1 tablet by mouth daily.     . ondansetron (ZOFRAN) 8 MG tablet Take 1 tablet (8 mg total) by mouth every 8 (eight) hours as needed for nausea or vomiting. 30 tablet 1  . prochlorperazine (COMPAZINE) 10 MG tablet Take 1 tablet (10 mg total) by mouth every 8 (eight) hours as needed for nausea or vomiting. 30 tablet 0  . rivaroxaban (XARELTO) 10 MG TABS tablet Take 1 tablet (10 mg total) by mouth daily. 30 tablet 5  . senna (SENOKOT) 8.6 MG TABS tablet Take 2 tablets by mouth at bedtime.    . simvastatin (ZOCOR) 20 MG tablet Take 20 mg by mouth daily.    . temazepam (RESTORIL) 15 MG capsule Take 15 mg by mouth at bedtime as needed for sleep.      No current facility-administered medications for this visit.     PHYSICAL EXAMINATION: ECOG FS:1 - Symptomatic but completely ambulatory  There were no vitals filed for this visit. Wt Readings from Last 3 Encounters:  08/26/19 224 lb 8 oz (101.8 kg)  07/29/19 224 lb (101.6 kg)  07/08/19 226 lb 3.2 oz (102.6 kg)   There is no height or weight on file to calculate BMI.    GENERAL:alert, in no acute distress  and comfortable SKIN: no acute rashes, no significant lesions EYES: conjunctiva are pink and non-injected, sclera anicteric OROPHARYNX: MMM, no exudates, no oropharyngeal erythema or ulceration NECK: supple, no JVD LYMPH:  no palpable lymphadenopathy in the cervical, axillary or inguinal regions LUNGS: clear to auscultation b/l with normal respiratory effort HEART: regular rate & rhythm ABDOMEN:  normoactive bowel sounds , non tender, not distended. Extremity: no pedal edema PSYCH: alert & oriented x 3 with fluent speech NEURO: no focal motor/sensory deficits     LABORATORY DATA:   CBC Latest Ref Rng & Units 08/26/2019 07/29/2019 07/08/2019  WBC 4.0 - 10.5 K/uL 3.1(L) 4.0 4.1  Hemoglobin 13.0 - 17.0 g/dL 13.5 14.2 14.5  Hematocrit 39.0 - 52.0  % 39.1 41.0 43.5  Platelets 150 - 400 K/uL 141(L) 128(L) 112(L)     CMP Latest Ref Rng & Units 08/26/2019 07/29/2019 07/08/2019  Glucose 70 - 99 mg/dL 103(H) 97 87  BUN 8 - 23 mg/dL _0 Creatinine 0.61 - 1.24 mg/dL 0.98 0.95 1.03  Sodium 135 - 145 mmol/L 142 142 143  Potassium 3.5 - 5.1 mmol/L 4.2 4.4 4.1  Chloride 98 - 111 mmol/L 109 108 107  CO2 22 - 32 mmol/L _1 Calcium 8.9 - 10.3 mg/dL 8.7(L) 8.9 8.6(L)  Total Protein 6.5 - 8.1 g/dL 5.9(L) 6.2(L) 6.3(L)  Total Bilirubin 0.3 - 1.2 mg/dL 0.5 0.6 0.6  Alkaline Phos 38 - 126 U/L 85 80 80  AST 15 - 41 U/L 30 32 25  ALT 0 - 44 U/L 35 37 29   . Lab Results  Component Value Date   LDH 210 (H) 06/10/2019    Fine Needle Aspiration, Lymph NodeResulted: 12/04/2018 1:14 PM [facility] Result Narrative  ACCESSION NUMBER: [id number] RECEIVED: 11/27/2018 ORDERING PHYSICIAN: RAKHEE Megan Mans , MD PATIENT NAME: Docia Barrier LYN CYTOLOGY REPORT * Amended * Final Cytologic Interpretation AMENDED REPORT  AMENDED 12/04/2018 TO ADD DIAGNOSIS   A. Right inguinal lymph node, Fine Needle Aspiration II (smears and cell block): B-cell lymphoma with germinal center phenotype (see comment)  Specimen Adequacy: Satisfactory for evaluation.  B. Cytology core biopsy: B-cell lymphoma with germinal center phenotype (see comment)  Specimen Adequacy: Satisfactory for evaluation.    COMMENT:Sections of the core biopsy demonstrate an atypical vaguely nodular lymphoid infiltrate composed of medium sized lymphoid cells with mature chromatin, irregular nuclear membranes and scant cytoplasm. Focally some areas show larger cells. Few mitotic figures are present. Appropriately controlled immunohistochemical stains are performed. Sheets of B-cells are highlighted by CD20. These B-cells co-express BCL2 and BCL6 (focal, nodular distribution). They are negative for CD30 and MYC. CD21  demonstrates few residual disrupted follicular dendritic meshworks. The proliferation index is low (10-20%), as demonstrated by Ki-67. Scattered small T-cells are highlighted by CD3. Concurrent flow cytometry identified a kappa light chain restricted B-cell population, co-expressing CD10.  Overall, the findings are consistent with involvement by a B-cell lymphoma with a germinal center phenotype (GC-type), with focal follicular architecture and a low proliferation index. Given the nature of the sample (i.e. core biopsy) a final architectural subclassification is not possible. If clinically feasible, an excisional biopsy is recommended.  Correlation with clinical and cytogenetic/molecular data is recommended.    12/11/18 Inguinal LN Biopsy  Interpretation Tissue-Flow Cytometry - MONOCLONAL B-CELL POPULATION EXPRESSING CD10 COMPRISES 92% OF ALL LYMPHOCYTES - SEE COMMENT Microscopic Gated population: Flow cytometric immunophenotyping is performed using antibiodies to the antigens listed in the table below. Electronic gates  are placed around a cell cluster displaying light scatter properties corresponding to lymphocytes. - Abnormal Cells in gated population: 92 % - Phenotype of Abnormal Cells: CD10, CD19, CD20, CD21, CD22, HLA-Dr, Kappa Diagnosis Lymph node for lymphoma, Deep Right Inguinal - FOLLICULAR LYMPHOMA, GRADE 1-2 - SEE COMMENT Microscopic Comment The nodal architecture is effaced by a proliferation back to back follicles composed of medium to large lymphocytes with irregular, cleaved nuclei and scattered centroblasts. There are no diffuse areas of growth present within the examined nodal material. Immunohistochemistry performed on the tissue highlights a nodular B-cell proliferation with expression of CD20, CD10, bcl-6, and Bcl-2. CD21 highlights expanded follicular dendritic meshworks. The Ki-67 shows a slightly increased proliferation rate of 30-40%. The atypical  lymphocytes are negative for CD5. CD3 immunohistochemistry highlights the background T-cells. Flow cytometry was performed. A monoclonal B-cell populaton expressing CD10 comprises 92% of all lymphocytes (See [phone]). Overall, the findings are consistent with a diagnosis of low grade follicular lymphoma, Grade 1-2. Of note, the increased ki-67 may portend a more aggressive course despite the low histologic grade.   01/18/19 BM Biopsy:   05/05/19 BM Biopsy:     RADIOGRAPHIC STUDIES: I have personally reviewed the radiological images as listed and agreed with the findings in the report. No results found.    ASSESSMENT & PLAN:   Ethan Stewart is a 73 y.o. male with:  1) Diffuse large B-cell lymphoma stage IV AE with involvement of mesenteric, inguinal lymph nodes and C5 vertebral body with spinal cord impingement  Patient is status post six cycles of R-CHOP and R-mini-CHOP, completed in 2016, as noted above.  2) History of capillary leak syndrome with ARDS requiring intubation likely due to G-CSF.  3) Recently diagnosed Follicular Lymphoma, Grade 1-2   11/02/18 CT A/P which revealed Negative for inguinal or bowel hernia but positive for bulky right inguinal and external iliac lymphadenopathy which is new since a 2017 PET-CT and highly suspicious for recurrent Lymphoma. This would be amenable to Ultrasound-guided needle biopsy. 2. No abdominal or contralateral left hemi pelvic lymphadenopathy. No other acute findings identified in the abdomen or pelvis. 3. Chronic pneumobilia.  Aortic Atherosclerosis.  11/12/18 PET/CT which revealed  Interval development of multistation lymphadenopathy in the abdomen and pelvis as detailed above- Deauville 5. 2. Presence of pneumobilia could reflect sphincter of Oddi dysfunction versus recent procedure. Correlate with clinical history and upcoming comprehensive metabolic panel. 3. Ancillary CT findings   11/27/18 US guided LN Biopsy pathology  report is as noted above. " B-cell lymphoma with a germinal center phenotype (GC-type), with focal follicular architecture and a low proliferation index. Given the nature of the sample (i.e. core biopsy) a final architectural subclassification is not possible. If clinically feasible, an excisional biopsy is recommended."  12/11/18 Right Inguinal LN biopsy revealed Grade 1-2 Follicular Lymphoma  05/28/28 CT A/P revealed Today's study demonstrates clear progression of disease with increased number and size of numerous enlarged lymph nodes throughout the lower thorax, abdomen and pelvis, as detailed above. 2. In addition, there are several small pulmonary nodules in the lung bases. The largest of these are clustered in the posterior aspect of the right lower lobe in an apparent area of scarring which is very similar to prior PET-CT 03/12/2016, favored to be benign. However, the smaller nodules which measure up to 6 mm in size are nonspecific and warrant attention on follow-up studies. 3. Aortic atherosclerosis, in addition to least 2 vessel coronary artery disease. Assessment for potential risk factor  modification, dietary therapy or pharmacologic therapy may be warranted, if clinically indicated. 4. Additional incidental findings, as above. 01/18/19 BM Bx revealed normocellular bone marrow with trilineage hematopoiesis Stage II indicated with 01/26/19 CT  Began C1 of 80m Revlimid and Rituxan on 02/05/19  05/05/19 BM Bx revealed normocellular bone marrow with erythroid hyperplasia, and no evidence of lymphoma  05/11/19 PET/CT revealed "Near complete metabolic response to therapy of lymphoma since the prior PET of 11/12/2018. Low-level hypermetabolism at the site of adenopathy within the abdominal retroperitoneum persists. (Deauville) 2. There has also been resolution of abdominopelvic adenopathy since the diagnostic CT of 01/26/2019. 2. Nonspecific right lower lobe pulmonary nodules, similar. 3. Coronary  artery atherosclerosis. Aortic Atherosclerosis."   4) History of right lower extremity distal DVT. Resolved. S/p 6 months of treatment in 02/2016 -Returning to 189mXarelto while taking Revlimid  5) Mild chronic thrombocytopenia-  128k   PLAN: -Discussed pt labwork today, 09/23/19; all values are WNL except for WBC Count at 3.1, Platelet Count at 132, Neutro Abs at 1.4, Calcium at 8.8, and Total Protein at 6.2. -The pt has no prohibitive toxicities from continuing C11 Rituximab and Revlimid 1561mO 3w on 1 w off  at this time. -no lab or clinical evidence of lymphoma progression at this time. -Follow up appt next month. Depending on results adjust treatment to every other month. -CT scans today. Depending on results follow up in two months.  FOLLOW UP: CT chest/abd/pelvis in 3weeks Plz schedule next dose of RItuxan with labs and MD visit in 4 weeks   The total time spent in the appt was 25 minutes and more than 50% was on counseling and direct patient cares.  All of the patient's questions were answered with apparent satisfaction. The patient knows to call the clinic with any problems, questions or concerns.   GauSullivan Lone MS Hematology/Oncology Physician [facility] LtdOffice):       [phone] cell):  [phone]):           [phone], AmbJacqualyn Poseym acting as a scribe for Dr. GauSullivan Lone .I have reviewed the above documentation for accuracy and completeness, and I agree with the above. .GaBrunetta Genera

## 2019-09-23 NOTE — Patient Instructions (Signed)
[facility] Discharge Instructions for Patients Receiving Chemotherapy  Today you received the following chemotherapy agents:  Rituxan.  To help prevent nausea and vomiting after your treatment, we encourage you to take your nausea medication as directed.   If you develop nausea and vomiting that is not controlled by your nausea medication, call the clinic.   BELOW ARE SYMPTOMS THAT SHOULD BE REPORTED IMMEDIATELY:  *FEVER GREATER THAN 100.5 F  *CHILLS WITH OR WITHOUT FEVER  NAUSEA AND VOMITING THAT IS NOT CONTROLLED WITH YOUR NAUSEA MEDICATION  *UNUSUAL SHORTNESS OF BREATH  *UNUSUAL BRUISING OR BLEEDING  TENDERNESS IN MOUTH AND THROAT WITH OR WITHOUT PRESENCE OF ULCERS  *URINARY PROBLEMS  *BOWEL PROBLEMS  UNUSUAL RASH Items with * indicate a potential emergency and should be followed up as soon as possible.  Feel free to call the clinic should you have any questions or concerns. The clinic phone number is [phone].  Please show the CHEMO ALERT CARD at check-in to the Emergency Department and triage nurse.   

## 2019-09-23 NOTE — Progress Notes (Signed)
Unable to get blood return at discharge. Cathflo was given and after 30 minutes, there was still no blood return. Per Dr. Irene Limbo, Cathflo was left in the port and the patient was deaccessed. The patient does not need to return to clinic until next treatment.

## 2019-09-24 ENCOUNTER — Telehealth: Payer: Self-pay | Admitting: Hematology

## 2019-09-24 NOTE — Telephone Encounter (Signed)
Per 10/1 los appt already scheduled.

## 2019-10-06 DIAGNOSIS — G8191 Hemiplegia, unspecified affecting right dominant side: Secondary | ICD-10-CM | POA: Diagnosis not present

## 2019-10-06 DIAGNOSIS — Z0000 Encounter for general adult medical examination without abnormal findings: Secondary | ICD-10-CM | POA: Diagnosis not present

## 2019-10-06 DIAGNOSIS — E7800 Pure hypercholesterolemia, unspecified: Secondary | ICD-10-CM | POA: Diagnosis not present

## 2019-10-06 DIAGNOSIS — E038 Other specified hypothyroidism: Secondary | ICD-10-CM | POA: Diagnosis not present

## 2019-10-06 DIAGNOSIS — R69 Illness, unspecified: Secondary | ICD-10-CM | POA: Diagnosis not present

## 2019-10-06 DIAGNOSIS — Z125 Encounter for screening for malignant neoplasm of prostate: Secondary | ICD-10-CM | POA: Diagnosis not present

## 2019-10-06 DIAGNOSIS — G479 Sleep disorder, unspecified: Secondary | ICD-10-CM | POA: Diagnosis not present

## 2019-10-14 ENCOUNTER — Ambulatory Visit (HOSPITAL_COMMUNITY)
Admission: RE | Admit: 2019-10-14 | Discharge: 2019-10-14 | Disposition: A | Discharge status: Home / Self Care | Payer: No Typology Code available for payment source | Source: Ambulatory Visit | Attending: Hematology | Admitting: Hematology

## 2019-10-14 ENCOUNTER — Encounter (HOSPITAL_COMMUNITY): Payer: Self-pay

## 2019-10-14 ENCOUNTER — Other Ambulatory Visit: Payer: Self-pay

## 2019-10-14 DIAGNOSIS — C8298 Follicular lymphoma, unspecified, lymph nodes of multiple sites: Secondary | ICD-10-CM | POA: Diagnosis present

## 2019-10-14 DIAGNOSIS — Z5112 Encounter for antineoplastic immunotherapy: Secondary | ICD-10-CM | POA: Insufficient documentation

## 2019-10-14 DIAGNOSIS — C8590 Non-Hodgkin lymphoma, unspecified, unspecified site: Secondary | ICD-10-CM | POA: Diagnosis not present

## 2019-10-14 MED ORDER — SODIUM CHLORIDE (PF) 0.9 % IJ SOLN
INTRAMUSCULAR | Status: AC
  Filled 2019-10-14: qty 50

## 2019-10-14 MED ORDER — IOHEXOL 300 MG/ML  SOLN
100.0000 mL | Freq: Once | INTRAMUSCULAR | Status: AC | PRN
  Administered 2019-10-14: 100 mL via INTRAVENOUS

## 2019-10-15 ENCOUNTER — Other Ambulatory Visit: Payer: Self-pay

## 2019-10-15 MED ORDER — LENALIDOMIDE 15 MG PO CAPS: 15.0000 mg | ORAL_CAPSULE | Freq: Every day | ORAL | 0 refills | Status: DC

## 2019-10-21 ENCOUNTER — Inpatient Hospital Stay: Payer: No Typology Code available for payment source

## 2019-10-21 ENCOUNTER — Other Ambulatory Visit: Payer: Self-pay

## 2019-10-21 ENCOUNTER — Inpatient Hospital Stay (HOSPITAL_BASED_OUTPATIENT_CLINIC_OR_DEPARTMENT_OTHER): Payer: No Typology Code available for payment source | Admitting: Hematology

## 2019-10-21 VITALS — BP 115/67 | HR 64 | Temp 96.7°F | Resp 18

## 2019-10-21 VITALS — BP 143/70 | HR 56 | Temp 96.7°F | Resp 18 | Ht 73.0 in | Wt 224.9 lb

## 2019-10-21 DIAGNOSIS — C8298 Follicular lymphoma, unspecified, lymph nodes of multiple sites: Secondary | ICD-10-CM | POA: Diagnosis not present

## 2019-10-21 DIAGNOSIS — Z2989 Encounter for other specified prophylactic measures: Secondary | ICD-10-CM

## 2019-10-21 DIAGNOSIS — C8330 Diffuse large B-cell lymphoma, unspecified site: Secondary | ICD-10-CM

## 2019-10-21 DIAGNOSIS — Z298 Encounter for other specified prophylactic measures: Secondary | ICD-10-CM | POA: Diagnosis not present

## 2019-10-21 DIAGNOSIS — Z5112 Encounter for antineoplastic immunotherapy: Secondary | ICD-10-CM

## 2019-10-21 DIAGNOSIS — Z95828 Presence of other vascular implants and grafts: Secondary | ICD-10-CM

## 2019-10-21 LAB — CMP (CANCER CENTER ONLY)
ALT: 38 U/L (ref 0–44)
AST: 39 U/L (ref 15–41)
Albumin: 3.6 g/dL (ref 3.5–5.0)
Alkaline Phosphatase: 87 U/L (ref 38–126)
Anion gap: 8 (ref 5–15)
BUN: 16 mg/dL (ref 8–23)
CO2: 25 mmol/L (ref 22–32)
Calcium: 8.6 mg/dL — ABNORMAL LOW (ref 8.9–10.3)
Chloride: 109 mmol/L (ref 98–111)
Creatinine: 0.94 mg/dL (ref 0.61–1.24)
GFR, Est AFR Am: >60 mL/min (ref >60)
GFR, Estimated: >60 mL/min (ref >60)
Glucose, Bld: 101 mg/dL — ABNORMAL HIGH (ref 70–99)
Potassium: 4.2 mmol/L (ref 3.5–5.1)
Sodium: 142 mmol/L (ref 135–145)
Total Bilirubin: 0.5 mg/dL (ref 0.3–1.2)
Total Protein: 5.9 g/dL — ABNORMAL LOW (ref 6.5–8.1)

## 2019-10-21 LAB — CBC WITH DIFFERENTIAL/PLATELET
Abs Immature Granulocytes: 0 10*3/uL (ref 0.00–0.07)
Basophils Absolute: 0.1 10*3/uL (ref 0.0–0.1)
Basophils Relative: 3 %
Eosinophils Absolute: 0.2 10*3/uL (ref 0.0–0.5)
Eosinophils Relative: 6 %
HCT: 38.9 % — ABNORMAL LOW (ref 39.0–52.0)
Hemoglobin: 13.4 g/dL (ref 13.0–17.0)
Immature Granulocytes: 0 %
Lymphocytes Relative: 28 %
Lymphs Abs: 0.8 10*3/uL (ref 0.7–4.0)
MCH: 30 pg (ref 26.0–34.0)
MCHC: 34.4 g/dL (ref 30.0–36.0)
MCV: 87.2 fL (ref 80.0–100.0)
Monocytes Absolute: 0.6 10*3/uL (ref 0.1–1.0)
Monocytes Relative: 19 %
Neutro Abs: 1.3 10*3/uL — ABNORMAL LOW (ref 1.7–7.7)
Neutrophils Relative %: 44 %
Platelets: 123 10*3/uL — ABNORMAL LOW (ref 150–400)
RBC: 4.46 MIL/uL (ref 4.22–5.81)
RDW: 14.2 % (ref 11.5–15.5)
WBC: 3 10*3/uL — ABNORMAL LOW (ref 4.0–10.5)
nRBC: 0 % (ref 0.0–0.2)

## 2019-10-21 LAB — LACTATE DEHYDROGENASE: LDH: 270 U/L — ABNORMAL HIGH (ref 98–192)

## 2019-10-21 MED ORDER — HEPARIN SOD (PORK) LOCK FLUSH 100 UNIT/ML IV SOLN
500.0000 [IU] | Freq: Once | INTRAVENOUS | Status: AC | PRN
  Administered 2019-10-21: 500 [IU]
  Filled 2019-10-21: qty 5

## 2019-10-21 MED ORDER — ACETAMINOPHEN 325 MG PO TABS
650.0000 mg | ORAL_TABLET | Freq: Once | ORAL | Status: AC
  Administered 2019-10-21: 650 mg via ORAL

## 2019-10-21 MED ORDER — METHYLPREDNISOLONE SODIUM SUCC 125 MG IJ SOLR
INTRAMUSCULAR | Status: AC
  Filled 2019-10-21: qty 2

## 2019-10-21 MED ORDER — SODIUM CHLORIDE 0.9% FLUSH
10.0000 mL | Freq: Once | INTRAVENOUS | Status: AC
  Administered 2019-10-21: 09:00:00 10 mL
  Filled 2019-10-21: qty 10

## 2019-10-21 MED ORDER — SODIUM CHLORIDE 0.9% FLUSH
10.0000 mL | INTRAVENOUS | Status: DC | PRN
  Administered 2019-10-21: 10 mL
  Filled 2019-10-21: qty 10

## 2019-10-21 MED ORDER — DIPHENHYDRAMINE HCL 25 MG PO CAPS
ORAL_CAPSULE | ORAL | Status: AC
  Filled 2019-10-21: qty 2

## 2019-10-21 MED ORDER — SODIUM CHLORIDE 0.9 % IV SOLN
375.0000 mg/m2 | Freq: Once | INTRAVENOUS | Status: AC
  Administered 2019-10-21: 900 mg via INTRAVENOUS
  Filled 2019-10-21: qty 50

## 2019-10-21 MED ORDER — ACETAMINOPHEN 325 MG PO TABS
ORAL_TABLET | ORAL | Status: AC
  Filled 2019-10-21: qty 2

## 2019-10-21 MED ORDER — SODIUM CHLORIDE 0.9 % IV SOLN
Freq: Once | INTRAVENOUS | Status: AC
  Administered 2019-10-21: 11:00:00 via INTRAVENOUS
  Filled 2019-10-21: qty 250

## 2019-10-21 MED ORDER — DIPHENHYDRAMINE HCL 25 MG PO CAPS
50.0000 mg | ORAL_CAPSULE | Freq: Once | ORAL | Status: AC
  Administered 2019-10-21: 50 mg via ORAL

## 2019-10-21 MED ORDER — METHYLPREDNISOLONE SODIUM SUCC 125 MG IJ SOLR
125.0000 mg | Freq: Once | INTRAMUSCULAR | Status: AC
  Administered 2019-10-21: 125 mg via INTRAVENOUS

## 2019-10-21 NOTE — Patient Instructions (Signed)
[facility] Discharge Instructions for Patients Receiving Chemotherapy  Today you received the following chemotherapy agents:  Rituxan.  To help prevent nausea and vomiting after your treatment, we encourage you to take your nausea medication as directed.   If you develop nausea and vomiting that is not controlled by your nausea medication, call the clinic.   BELOW ARE SYMPTOMS THAT SHOULD BE REPORTED IMMEDIATELY:  *FEVER GREATER THAN 100.5 F  *CHILLS WITH OR WITHOUT FEVER  NAUSEA AND VOMITING THAT IS NOT CONTROLLED WITH YOUR NAUSEA MEDICATION  *UNUSUAL SHORTNESS OF BREATH  *UNUSUAL BRUISING OR BLEEDING  TENDERNESS IN MOUTH AND THROAT WITH OR WITHOUT PRESENCE OF ULCERS  *URINARY PROBLEMS  *BOWEL PROBLEMS  UNUSUAL RASH Items with * indicate a potential emergency and should be followed up as soon as possible.  Feel free to call the clinic should you have any questions or concerns. The clinic phone number is [phone].  Please show the CHEMO ALERT CARD at check-in to the Emergency Department and triage nurse.   

## 2019-10-21 NOTE — Progress Notes (Signed)
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 Cytogenetics and Fish showed BCL 2 positivity but negative for BCL 6 and cMYC CD10 positivity suggestive of possibly GCB subtype.  Patient received first cycle of R CHOP on 08/08/2015 and intrathecal methotrexate with hydrocortisone on 08/09/2015. Patient subsequently was admitted with ARDS likely due to neulasta -treated with high dose steroids and empirically with broad spectrum antibiotics.  2nd cycle of chemotherapy delayed to allow for rehabilitation (was due on 08/31/2015).  Received R-mini-CHOP for cycle 2 and tolerated it without significant cytopenias. Was admitted briefly for a mild lower extremity cellulitis and DVT. Cellulitis now resolved. Patient completing his oral antibiotics.  PET/CT scan on 09/29/2015 with no evidence of disease progression. Good response in his mesenteric lymph nodes. Inguinal lymph nodes on the right side.  Decreased uptake in his C5 level.  No new lesions noted.   Cycle 3 (10/02/2015)  of R- CHOP ( we increased the doxorubicin dose back to 50 mg/m and keep the cyclophosphamide dose reduction at 400 mg/m]  Cycle 4 (10/23/2015) of R-CHOP ( we increased the doxorubicin dose back to 50 mg/m and kept the cyclophosphamide dose reduction at 400 mg/m].  PET/CT 11/10/2015: Shows improvement in most lesions but is noted to have a new FDG avid lymph node in the mesentery of the small bowel.  Cycle 5 on 11/13/2015 R CHOP - received full dose.  Cycle 6 on 12/11/2015 R-CHOP full dose.  Admitted with neutropenic fevers and treatment for possible early pneumonia.  This resolved and he was able to discharge home  01/09/2016 through 01/22/2016: The patient was treated to the C4-C6 levels of the spine to a dose of 30 gray in 10 fractions using a 2 field technique. The patient was also treated to an abdominal lymph node region to a dose of 30 gray in 10 fractions using a 3 field technique. This treatment consisted of a 3-D conformal technique with daily image guidance utilized.   Repeat PET CT scan in March 12 2016 -showed some enlarged mesenteric lymph nodes which were FDG avid.  CT guided FNA of mesenteric lymph nodes at [facility] on 03/29/2016 - showed scant cellularity. No overt evidence of lymphoma.  Rpt PET/[facility] 05/15/2016 at [facility]- show similar appearing enlarged mesenteric lymph nodes which are FDG avid. No change in size and no other evidence of disease progression.  PET/CT 07/24/2016 - and [facility] shows no evidence of disease progression  PET/CT 01/22/2017:  Result Impression  1.Stable low-level metabolic activity in small lymph nodes in the small bowel mesentery. No new FDG avid disease. 2.Persistent hypermetabolic soft tissue thickening overlying the coccyx which could relate to a decubitus ulcer and should be amenable to direct inspection.  3.Stable pulmonary nodules which do not appear hypermetabolic, however, several are below the size resolution of PET in size. Continued attention on follow-up is suggested. 4.Previously seen hypermetabolic skin thickening near the right ear has resolved. 5.Ancillary CT findings as above.    INTERVAL HISTORY:   [name] is here for management and evaluation of [name], grade 1-2. The patient's last visit with [place] was on 09/23/2019. The pt reports that he is doing well overall.  The pt reports feeling well  Of note since the patient's last visit, pt has had CT CHEST, ABDOMEN, AND PELVIS WITH CONTRAST (Accession [id number]) completed on 10/14/2019 with results revealing " 1. No findings of active lymphoma. 2. Pulmonary nodules in the right lung are mostly stable from March  2017, and considered benign, although one 5 mm right lower lobe nodule medially on image 115/6 is stable of the last year but not well seen on 03/12/2016 and may merit surveillance. 3. Reduced prominence of treatment related stranding in the left periaortic region likely the residua of prior adenopathy. 4. Other imaging findings of potential clinical significance:Coronary atherosclerosis. Mild cardiomegaly. Cholelithiasis. Mild prostatomegaly. Mild lumbar spondylosis and degenerative disc disease causing mild impingement at L3-4 and L4-5. Small left renal peripelvic cysts. Prior bilateral groin hernia repairs. Pneumobilia, query prior sphincterotomy.".  Lab results today (10/21/19) of CBC w/diff and CMP is as follows: all values are WNL except for WBC at 3.0, HCT at 38.9, Platelets at 123, Neutro Abs at 1.3, Glucose Bld at 101, Calcium at 8.6, Total Protein at 5.9 LDH . Lab Results  Component Value Date   LDH 270 (H) 10/21/2019   On review of systems, pt reports healthy appetite, constipation, some neck pain from surgery, lower back pain from lifting and denies lumps and bumps,  infections, fevers, chills, diarrhea, abdominal pain, and any other symptoms.    REVIEW OF SYSTEMS:    A 10+ POINT REVIEW OF SYSTEMS WAS OBTAINED including neurology, dermatology, psychiatry, cardiac, respiratory, lymph, extremities, GI, GU, Musculoskeletal, constitutional, breasts, reproductive, HEENT.  All pertinent positives are noted in the HPI.  All others are negative.   ALLERGIES:  is allergic to neulasta [pegfilgrastim].  MEDICATIONS:  Current Outpatient Medications  Medication Sig Dispense Refill  . diazepam (VALIUM) 2 MG tablet Take 2 mg by mouth daily as needed for anxiety.    Ethan Stewart Kitchen HYDROcodone-acetaminophen (NORCO) 5-325 MG tablet Take 1-2 tablets by mouth every 6 (six) hours as needed for moderate pain or severe pain. (Patient not taking: Reported on 12/17/2018) 20 tablet 0  . lenalidomide (REVLIMID) 15 MG capsule Take 1 capsule (15 mg total) by mouth daily. 3 weeks on 1 week off. 21 capsule 0  . levothyroxine (SYNTHROID, LEVOTHROID) 88 MCG tablet Take 88 mcg by mouth daily before breakfast.   2  . lidocaine-prilocaine (EMLA) cream Apply 1 application topically as needed (for port access).    . Multiple Vitamin (MULTIVITAMIN WITH MINERALS) TABS tablet Take 1 tablet by mouth daily.     . ondansetron (ZOFRAN) 8 MG tablet Take 1 tablet (8 mg total) by mouth every 8 (eight) hours as needed for nausea or vomiting. 30 tablet 1  . prochlorperazine (COMPAZINE) 10 MG tablet Take 1 tablet (10 mg total) by mouth  every 8 (eight) hours as needed for nausea or vomiting. 30 tablet 0  . rivaroxaban (XARELTO) 10 MG TABS tablet Take 1 tablet (10 mg total) by mouth daily. 30 tablet 5  . senna (SENOKOT) 8.6 MG TABS tablet Take 2 tablets by mouth at bedtime.    . simvastatin (ZOCOR) 20 MG tablet Take 20 mg by mouth daily.    . temazepam (RESTORIL) 15 MG capsule Take 15 mg by mouth at bedtime as needed for sleep.      No current facility-administered medications for this visit.     PHYSICAL  EXAMINATION: ECOG FS:1 - Symptomatic but completely ambulatory  There were no vitals filed for this visit. Wt Readings from Last 3 Encounters:  09/23/19 222 lb 4.8 oz (100.8 kg)  08/26/19 224 lb 8 oz (101.8 kg)  07/29/19 224 lb (101.6 kg)   There is no height or weight on file to calculate BMI.    GENERAL:alert, in no acute distress and comfortable SKIN: no acute rashes, no significant lesions EYES: conjunctiva are pink and non-injected, sclera anicteric OROPHARYNX: MMM, no exudates, no oropharyngeal erythema or ulceration NECK: supple, no JVD LYMPH:  no palpable lymphadenopathy in the cervical, axillary or inguinal regions LUNGS: clear to auscultation b/l with normal respiratory effort HEART: regular rate & rhythm ABDOMEN:  normoactive bowel sounds , non tender, not distended. Extremity: no pedal edema PSYCH: alert & oriented x 3 with fluent speech NEURO: no focal motor/sensory deficits       LABORATORY DATA:   CBC Latest Ref Rng & Units 09/23/2019 08/26/2019 07/29/2019  WBC 4.0 - 10.5 K/uL 3.1(L) 3.1(L) 4.0  Hemoglobin 13.0 - 17.0 g/dL 14.3 13.5 14.2  Hematocrit 39.0 - 52.0 % 41.5 39.1 41.0  Platelets 150 - 400 K/uL 132(L) 141(L) 128(L)     CMP Latest Ref Rng & Units 09/23/2019 08/26/2019 07/29/2019  Glucose 70 - 99 mg/dL 81 103(H) 97  BUN 8 - 23 mg/dL '19 21 19  ' Creatinine 0.61 - 1.24 mg/dL 0.90 0.98 0.95  Sodium 135 - 145 mmol/L 141 142 142  Potassium 3.5 - 5.1 mmol/L 4.3 4.2 4.4  Chloride 98 - 111 mmol/L 108 109 108  CO2 22 - 32 mmol/L '27 26 23  ' Calcium 8.9 - 10.3 mg/dL 8.8(L) 8.7(L) 8.9  Total Protein 6.5 - 8.1 g/dL 6.2(L) 5.9(L) 6.2(L)  Total Bilirubin 0.3 - 1.2 mg/dL 0.5 0.5 0.6  Alkaline Phos 38 - 126 U/L 86 85 80  AST 15 - 41 U/L 34 30 32  ALT 0 - 44 U/L 37 35 37   . Lab Results  Component Value Date   LDH 210 (H) 06/10/2019    Fine Needle Aspiration, Lymph NodeResulted: 12/04/2018 1:14 PM [facility] Result Narrative  ACCESSION  NUMBER: [id number] RECEIVED: 11/27/2018 ORDERING PHYSICIAN: RAKHEE Megan Mans , MD PATIENT NAME: Ethan Stewart CYTOLOGY REPORT * Amended * Final Cytologic Interpretation AMENDED REPORT  AMENDED 12/04/2018 TO ADD DIAGNOSIS   A. Right inguinal lymph node, Fine Needle Aspiration II (smears and cell block): B-cell lymphoma with germinal center phenotype (see comment)  Specimen Adequacy: Satisfactory for evaluation.  B. Cytology core biopsy: B-cell lymphoma with germinal center phenotype (see comment)  Specimen Adequacy: Satisfactory for evaluation.    COMMENT:Sections of the core biopsy demonstrate an atypical vaguely nodular lymphoid infiltrate composed of medium sized lymphoid cells with mature chromatin, irregular nuclear membranes and scant cytoplasm. Focally some areas show larger cells. Few mitotic figures are present. Appropriately controlled immunohistochemical stains  are performed. Sheets of B-cells are highlighted by CD20. These B-cells co-express BCL2 and BCL6 (focal, nodular distribution). They are negative for CD30 and MYC. CD21 demonstrates few residual disrupted follicular dendritic meshworks. The proliferation index is low (10-20%), as demonstrated by Ki-67. Scattered small T-cells are highlighted by CD3. Concurrent flow cytometry identified a kappa light chain restricted B-cell population, co-expressing CD10.  Overall, the findings are consistent with involvement by a B-cell lymphoma with a germinal center phenotype (GC-type), with focal follicular architecture and a low proliferation index. Given the nature of the sample (i.e. core biopsy) a final architectural subclassification is not possible. If clinically feasible, an excisional biopsy is recommended.  Correlation with clinical and cytogenetic/molecular data is recommended.    12/11/18 Inguinal LN Biopsy  Interpretation Tissue-Flow Cytometry - MONOCLONAL B-CELL  POPULATION EXPRESSING CD10 COMPRISES 92% OF ALL LYMPHOCYTES - SEE COMMENT Microscopic Gated population: Flow cytometric immunophenotyping is performed using antibiodies to the antigens listed in the table below. Electronic gates are placed around a cell cluster displaying light scatter properties corresponding to lymphocytes. - Abnormal Cells in gated population: 92 % - Phenotype of Abnormal Cells: CD10, CD19, CD20, CD21, CD22, HLA-Dr, Kappa Diagnosis Lymph node for lymphoma, Deep Right Inguinal - FOLLICULAR LYMPHOMA, GRADE 1-2 - SEE COMMENT Microscopic Comment The nodal architecture is effaced by a proliferation back to back follicles composed of medium to large lymphocytes with irregular, cleaved nuclei and scattered centroblasts. There are no diffuse areas of growth present within the examined nodal material. Immunohistochemistry performed on the tissue highlights a nodular B-cell proliferation with expression of CD20, CD10, bcl-6, and Bcl-2. CD21 highlights expanded follicular dendritic meshworks. The Ki-67 shows a slightly increased proliferation rate of 30-40%. The atypical lymphocytes are negative for CD5. CD3 immunohistochemistry highlights the background T-cells. Flow cytometry was performed. A monoclonal B-cell populaton expressing CD10 comprises 92% of all lymphocytes (See [id number]). Overall, the findings are consistent with a diagnosis of low grade follicular lymphoma, Grade 1-2. Of note, the increased ki-67 may portend a more aggressive course despite the low histologic grade.   01/18/19 BM Biopsy:   05/05/19 BM Biopsy:   10/22/202 CT CHEST, ABDOMEN, AND PELVIS WITH CONTRAST (Accession [id number])    RADIOGRAPHIC STUDIES: I have personally reviewed the radiological images as listed and agreed with the findings in the report. No results found.    ASSESSMENT & PLAN:   Ethan Stewart is a 73 y.o. male with:  1) Diffuse large B-cell lymphoma stage IV AE with  involvement of mesenteric, inguinal lymph nodes and C5 vertebral body with spinal cord impingement  Patient is status post six cycles of R-CHOP and R-mini-CHOP, completed in 2016, as noted above.  2) History of capillary leak syndrome with ARDS requiring intubation likely due to G-CSF.  3) Recently diagnosed Follicular Lymphoma, Grade 1-2   11/02/18 CT A/P which revealed Negative for inguinal or bowel hernia but positive for bulky right inguinal and external iliac lymphadenopathy which is new since a 2017 PET-CT and highly suspicious for recurrent Lymphoma. This would be amenable to Ultrasound-guided needle biopsy. 2. No abdominal or contralateral left hemi pelvic lymphadenopathy. No other acute findings identified in the abdomen or pelvis. 3. Chronic pneumobilia.  Aortic Atherosclerosis.  11/12/18 PET/CT which revealed  Interval development of multistation lymphadenopathy in the abdomen and pelvis as detailed above- Deauville 5. 2. Presence of pneumobilia could reflect sphincter of Oddi dysfunction versus recent procedure. Correlate with clinical history and upcoming comprehensive metabolic panel. 3. Ancillary CT findings  11/27/18 US guided LN Biopsy pathology report is as noted above. " B-cell lymphoma with a germinal center phenotype (GC-type), with focal follicular architecture and a low proliferation index. Given the nature of the sample (i.e. core biopsy) a final architectural subclassification is not possible. If clinically feasible, an excisional biopsy is recommended."  12/11/18 Right Inguinal LN biopsy revealed Grade 1-2 Follicular Lymphoma  08/29/40 CT A/P revealed Today's study demonstrates clear progression of disease with increased number and size of numerous enlarged lymph nodes throughout the lower thorax, abdomen and pelvis, as detailed above. 2. In addition, there are several small pulmonary nodules in the lung bases. The largest of these are clustered in the posterior aspect of  the right lower lobe in an apparent area of scarring which is very similar to prior PET-CT 03/12/2016, favored to be benign. However, the smaller nodules which measure up to 6 mm in size are nonspecific and warrant attention on follow-up studies. 3. Aortic atherosclerosis, in addition to least 2 vessel coronary artery disease. Assessment for potential risk factor modification, dietary therapy or pharmacologic therapy may be warranted, if clinically indicated. 4. Additional incidental findings, as above. 01/18/19 BM Bx revealed normocellular bone marrow with trilineage hematopoiesis Stage II indicated with 01/26/19 CT  Began C1 of 53m Revlimid and Rituxan on 02/05/19  05/05/19 BM Bx revealed normocellular bone marrow with erythroid hyperplasia, and no evidence of lymphoma  05/11/19 PET/CT revealed "Near complete metabolic response to therapy of lymphoma since the prior PET of 11/12/2018. Low-level hypermetabolism at the site of adenopathy within the abdominal retroperitoneum persists. ([place]ville) 2. There has also been resolution of abdominopelvic adenopathy since the diagnostic CT of 01/26/2019. 2. Nonspecific right lower lobe pulmonary nodules, similar. 3. Coronary artery atherosclerosis. Aortic Atherosclerosis."   4) History of right lower extremity distal DVT. Resolved. S/p 6 months of treatment in 02/2016 -Returning to 153mXarelto while taking Revlimid  5) Mild chronic thrombocytopenia-  128k   PLAN: -Discussed pt labwork today, 10/21/19;  CBC w/diff and CMP is as follows: all values are WNL except for WBC at 3.0, HCT at 38.9, Platelets at 123, Neutro Abs at 1.3, Glucose Bld at 101, Calcium at 8.6, Total Protein at 5.9 LDH elevated to 270 but no evidence of active lymphoma on CT -Discussed that today is cycle 10 of his RItuxan -White counts are a little low  -Advised that blood chemistries are normal.  -Advised that his treatment is working and that there is no evidence of lymphoma -Discussed  that he has a larger prostate. Pt states he has some urinary issues and was advised to follow up with PCP.  -Discussed that at 12-16 cycle switch to maintenance treatment. The decision up to the patient and if lymphoma continues to improve.  -Advised that information will be given to [facility] continue funding he's Rituxan.    FOLLOW UP: -Please schedule next 2 cycles of Rituxan [q. 4 weeks] with port flush labs and MD visits        The total time spent in the appt was 25 minutes and more than 50% was on counseling and direct patient cares.  All of the patient's questions were answered with apparent satisfaction. The patient knows to call the clinic with any problems, questions or concerns.    GaSullivan LoneD MSSan Felipe Puebloematology/Oncology Physician CoRegional Eye Surgery Center Inc(Office):       [phone] cell):  [phone]):           [phone]  I, Scot Dock, am acting as a scribe for Dr. Sullivan Lone.   .I have reviewed the above documentation for accuracy and completeness, and I agree with the above. Brunetta Genera MD

## 2019-10-25 ENCOUNTER — Telehealth: Payer: Self-pay | Admitting: Hematology

## 2019-10-25 NOTE — Telephone Encounter (Signed)
Scheduled appt per 10/29 los.  Spoke with pt and he is aware of the appt date and time.  Per MD its okay to schedule appts for 11/30 due to the MD being out the following week.

## 2019-11-09 ENCOUNTER — Other Ambulatory Visit: Payer: Self-pay | Admitting: *Deleted

## 2019-11-09 DIAGNOSIS — C8298 Follicular lymphoma, unspecified, lymph nodes of multiple sites: Secondary | ICD-10-CM

## 2019-11-09 MED ORDER — LENALIDOMIDE 15 MG PO CAPS: 15.0000 mg | ORAL_CAPSULE | Freq: Every day | ORAL | 0 refills | Status: DC

## 2019-11-09 NOTE — Telephone Encounter (Signed)
Patient requested refill of Revlimid - states he has 3 pills left and next week is week off Dr.Kale authorized refill [facility] [phone] with office cover sheet stating [id number], [facility]/attached and Medication list attached. Fax confirmation recieved

## 2019-11-10 DIAGNOSIS — Z79899 Other long term (current) drug therapy: Secondary | ICD-10-CM | POA: Diagnosis not present

## 2019-11-10 DIAGNOSIS — G479 Sleep disorder, unspecified: Secondary | ICD-10-CM | POA: Diagnosis not present

## 2019-11-10 DIAGNOSIS — Z0000 Encounter for general adult medical examination without abnormal findings: Secondary | ICD-10-CM | POA: Diagnosis not present

## 2019-11-10 DIAGNOSIS — E7800 Pure hypercholesterolemia, unspecified: Secondary | ICD-10-CM | POA: Diagnosis not present

## 2019-11-10 DIAGNOSIS — E038 Other specified hypothyroidism: Secondary | ICD-10-CM | POA: Diagnosis not present

## 2019-11-10 DIAGNOSIS — C8590 Non-Hodgkin lymphoma, unspecified, unspecified site: Secondary | ICD-10-CM | POA: Diagnosis not present

## 2019-11-10 DIAGNOSIS — Z125 Encounter for screening for malignant neoplasm of prostate: Secondary | ICD-10-CM | POA: Diagnosis not present

## 2019-11-10 DIAGNOSIS — G8191 Hemiplegia, unspecified affecting right dominant side: Secondary | ICD-10-CM | POA: Diagnosis not present

## 2019-11-10 DIAGNOSIS — R69 Illness, unspecified: Secondary | ICD-10-CM | POA: Diagnosis not present

## 2019-11-22 ENCOUNTER — Inpatient Hospital Stay: Payer: No Typology Code available for payment source

## 2019-11-22 ENCOUNTER — Inpatient Hospital Stay: Payer: No Typology Code available for payment source | Attending: Hematology

## 2019-11-22 ENCOUNTER — Other Ambulatory Visit: Payer: Self-pay

## 2019-11-22 ENCOUNTER — Other Ambulatory Visit: Payer: Self-pay | Admitting: *Deleted

## 2019-11-22 ENCOUNTER — Inpatient Hospital Stay (HOSPITAL_BASED_OUTPATIENT_CLINIC_OR_DEPARTMENT_OTHER): Payer: No Typology Code available for payment source | Admitting: Hematology

## 2019-11-22 VITALS — BP 120/58 | HR 59 | Temp 98.6°F | Resp 17

## 2019-11-22 VITALS — BP 124/61 | HR 62 | Temp 99.3°F | Resp 18 | Wt 220.9 lb

## 2019-11-22 DIAGNOSIS — C8330 Diffuse large B-cell lymphoma, unspecified site: Secondary | ICD-10-CM

## 2019-11-22 DIAGNOSIS — C8298 Follicular lymphoma, unspecified, lymph nodes of multiple sites: Secondary | ICD-10-CM

## 2019-11-22 DIAGNOSIS — R300 Dysuria: Secondary | ICD-10-CM

## 2019-11-22 DIAGNOSIS — Z298 Encounter for other specified prophylactic measures: Secondary | ICD-10-CM

## 2019-11-22 DIAGNOSIS — Z5112 Encounter for antineoplastic immunotherapy: Secondary | ICD-10-CM | POA: Diagnosis not present

## 2019-11-22 DIAGNOSIS — Z2989 Encounter for other specified prophylactic measures: Secondary | ICD-10-CM

## 2019-11-22 DIAGNOSIS — Z95828 Presence of other vascular implants and grafts: Secondary | ICD-10-CM

## 2019-11-22 LAB — CMP (CANCER CENTER ONLY)
ALT: 31 U/L (ref 0–44)
AST: 26 U/L (ref 15–41)
Albumin: 4 g/dL (ref 3.5–5.0)
Alkaline Phosphatase: 105 U/L (ref 38–126)
Anion gap: 8 (ref 5–15)
BUN: 17 mg/dL (ref 8–23)
CO2: 26 mmol/L (ref 22–32)
Calcium: 8.9 mg/dL (ref 8.9–10.3)
Chloride: 107 mmol/L (ref 98–111)
Creatinine: 0.93 mg/dL (ref 0.61–1.24)
GFR, Est AFR Am: >60 mL/min (ref >60)
GFR, Estimated: >60 mL/min (ref >60)
Glucose, Bld: 84 mg/dL (ref 70–99)
Potassium: 4.3 mmol/L (ref 3.5–5.1)
Sodium: 141 mmol/L (ref 135–145)
Total Bilirubin: 0.5 mg/dL (ref 0.3–1.2)
Total Protein: 6.3 g/dL — ABNORMAL LOW (ref 6.5–8.1)

## 2019-11-22 LAB — CBC WITH DIFFERENTIAL/PLATELET
Abs Immature Granulocytes: 0.01 10*3/uL (ref 0.00–0.07)
Basophils Absolute: 0.1 10*3/uL (ref 0.0–0.1)
Basophils Relative: 3 %
Eosinophils Absolute: 0.1 10*3/uL (ref 0.0–0.5)
Eosinophils Relative: 4 %
HCT: 40.8 % (ref 39.0–52.0)
Hemoglobin: 14 g/dL (ref 13.0–17.0)
Immature Granulocytes: 0 %
Lymphocytes Relative: 29 %
Lymphs Abs: 0.9 10*3/uL (ref 0.7–4.0)
MCH: 30.2 pg (ref 26.0–34.0)
MCHC: 34.3 g/dL (ref 30.0–36.0)
MCV: 87.9 fL (ref 80.0–100.0)
Monocytes Absolute: 0.7 10*3/uL (ref 0.1–1.0)
Monocytes Relative: 22 %
Neutro Abs: 1.3 10*3/uL — ABNORMAL LOW (ref 1.7–7.7)
Neutrophils Relative %: 42 %
Platelets: 148 10*3/uL — ABNORMAL LOW (ref 150–400)
RBC: 4.64 MIL/uL (ref 4.22–5.81)
RDW: 14.4 % (ref 11.5–15.5)
WBC: 3.2 10*3/uL — ABNORMAL LOW (ref 4.0–10.5)
nRBC: 0 % (ref 0.0–0.2)

## 2019-11-22 LAB — URINALYSIS, COMPLETE (UACMP) WITH MICROSCOPIC
Bacteria, UA: NONE SEEN
Bilirubin Urine: NEGATIVE
Glucose, UA: NEGATIVE mg/dL
Hgb urine dipstick: NEGATIVE
Ketones, ur: 5 mg/dL — AB
Leukocytes,Ua: NEGATIVE
Nitrite: NEGATIVE
Protein, ur: NEGATIVE mg/dL
Specific Gravity, Urine: 1.016 (ref 1.005–1.030)
pH: 5 (ref 5.0–8.0)

## 2019-11-22 MED ORDER — TAMSULOSIN HCL 0.4 MG PO CAPS: 0.4000 mg | ORAL_CAPSULE | Freq: Every day | ORAL | 1 refills | Status: DC

## 2019-11-22 MED ORDER — DIPHENHYDRAMINE HCL 25 MG PO CAPS
50.0000 mg | ORAL_CAPSULE | Freq: Once | ORAL | Status: AC
  Administered 2019-11-22: 50 mg via ORAL

## 2019-11-22 MED ORDER — DIPHENHYDRAMINE HCL 25 MG PO CAPS
ORAL_CAPSULE | ORAL | Status: AC
  Filled 2019-11-22: qty 2

## 2019-11-22 MED ORDER — ACETAMINOPHEN 325 MG PO TABS
ORAL_TABLET | ORAL | Status: AC
  Filled 2019-11-22: qty 2

## 2019-11-22 MED ORDER — METHYLPREDNISOLONE SODIUM SUCC 125 MG IJ SOLR
INTRAMUSCULAR | Status: AC
  Filled 2019-11-22: qty 2

## 2019-11-22 MED ORDER — HEPARIN SOD (PORK) LOCK FLUSH 100 UNIT/ML IV SOLN
500.0000 [IU] | Freq: Once | INTRAVENOUS | Status: AC | PRN
  Administered 2019-11-22: 500 [IU]
  Filled 2019-11-22: qty 5

## 2019-11-22 MED ORDER — SODIUM CHLORIDE 0.9% FLUSH
10.0000 mL | Freq: Once | INTRAVENOUS | Status: DC
  Filled 2019-11-22: qty 10

## 2019-11-22 MED ORDER — SODIUM CHLORIDE 0.9 % IV SOLN
Freq: Once | INTRAVENOUS | Status: AC
  Administered 2019-11-22: 12:00:00 via INTRAVENOUS
  Filled 2019-11-22: qty 250

## 2019-11-22 MED ORDER — METHYLPREDNISOLONE SODIUM SUCC 125 MG IJ SOLR
125.0000 mg | Freq: Once | INTRAMUSCULAR | Status: AC
  Administered 2019-11-22: 125 mg via INTRAVENOUS

## 2019-11-22 MED ORDER — ACETAMINOPHEN 325 MG PO TABS
650.0000 mg | ORAL_TABLET | Freq: Once | ORAL | Status: AC
  Administered 2019-11-22: 650 mg via ORAL

## 2019-11-22 MED ORDER — SODIUM CHLORIDE 0.9 % IV SOLN
375.0000 mg/m2 | Freq: Once | INTRAVENOUS | Status: AC
  Administered 2019-11-22: 900 mg via INTRAVENOUS
  Filled 2019-11-22: qty 50

## 2019-11-22 MED ORDER — SODIUM CHLORIDE 0.9% FLUSH
10.0000 mL | INTRAVENOUS | Status: DC | PRN
  Administered 2019-11-22: 10 mL
  Filled 2019-11-22: qty 10

## 2019-11-22 NOTE — Progress Notes (Signed)
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 Cytogenetics and Fish showed BCL 2 positivity but negative for BCL 6 and cMYC CD10 positivity suggestive of possibly GCB subtype.  Patient received first cycle of R CHOP on 08/08/2015 and intrathecal methotrexate with hydrocortisone on 08/09/2015. Patient subsequently was admitted with ARDS likely due to neulasta -treated with high dose steroids and empirically with broad spectrum antibiotics.  2nd cycle of chemotherapy delayed to allow for rehabilitation (was due on 08/31/2015).  Received R-mini-CHOP for cycle 2 and tolerated it without significant cytopenias. Was admitted briefly for a mild lower extremity cellulitis and DVT. Cellulitis now resolved. Patient completing his oral antibiotics.  PET/CT scan on 09/29/2015 with no evidence of disease progression. Good response in his mesenteric lymph nodes. Inguinal lymph nodes on the right side.  Decreased uptake in his C5 level.  No new lesions noted.   Cycle 3 (10/02/2015)  of R- CHOP ( we increased the doxorubicin dose back to 50 mg/m and keep the cyclophosphamide dose reduction at 400 mg/m]  Cycle 4 (10/23/2015) of R-CHOP ( we increased the doxorubicin dose back to 50 mg/m and kept the cyclophosphamide dose reduction at 400 mg/m].  PET/CT 11/10/2015: Shows improvement in most lesions but is noted to have a new FDG avid lymph node in the mesentery of the small bowel.  Cycle 5 on 11/13/2015 R CHOP - received full dose.  Cycle 6 on 12/11/2015 R-CHOP full dose.  Admitted with neutropenic fevers and treatment for possible early pneumonia.  This resolved and he was able to discharge home  01/09/2016 through 01/22/2016: The patient was treated to the C4-C6 levels of the spine to a dose of 30 gray in 10 fractions using a 2 field technique. The patient was also treated to an abdominal lymph node region to a dose of 30 gray in 10 fractions using a 3 field technique. This treatment consisted of a 3-D conformal technique with daily image guidance utilized.   Repeat PET CT scan in March 12 2016 -showed some enlarged mesenteric lymph nodes which were FDG avid.  CT guided FNA of mesenteric lymph nodes at [facility] on 03/29/2016 - showed scant cellularity. No overt evidence of lymphoma.  Rpt PET/[facility] 05/15/2016 at [facility]- show similar appearing enlarged mesenteric lymph nodes which are FDG avid. No change in size and no other evidence of disease progression.  PET/CT 07/24/2016 - and [facility] shows no evidence of disease progression  PET/CT 01/22/2017:  Result Impression  1.Stable low-level metabolic activity in small lymph nodes in the small bowel mesentery. No new FDG avid disease. 2.Persistent hypermetabolic soft tissue thickening overlying the coccyx which could relate to a decubitus ulcer and should be amenable to direct inspection.  3.Stable pulmonary nodules which do not appear hypermetabolic, however, several are below the size resolution of PET in size. Continued attention on follow-up is suggested. 4.Previously seen hypermetabolic skin thickening near the right ear has resolved. 5.Ancillary CT findings as above.    INTERVAL HISTORY:   Mr. Denk is here for management and evaluation of [name], grade 1-2. Pt is here for C11 of Rituxan. The patient's last visit with [place] was on 10/21/2019. The pt reports that he is doing well overall.  The pt reports that his PSA during his yearly lab work with his PCP was 4.1 but was 1.9 last year. This was an incidental finding. There were no additional urine samples taken at that time. He is having trouble and some discomfort passing urine. His symptoms are bothersome and have changed in last 3 months. Pt is interested in receiving the COVID-19 vaccine when possible.   Lab results today (11/22/19) of CBC w/diff and CMP is as follows: all values are WNL except for WBC at 3.2K, PLTs at 148K, Neutro Abs at 1.3K, Total Protein at 6.3.  On review of systems, pt reports trouble passing urine, dysuria and denies new lumps/bumps, pain along the spine, abdominal pain, rashes and any other symptoms.    REVIEW OF SYSTEMS:   A 10+ POINT REVIEW OF SYSTEMS WAS OBTAINED including neurology, dermatology, psychiatry, cardiac, respiratory, lymph, extremities, GI, GU, Musculoskeletal, constitutional, breasts, reproductive, HEENT.  All pertinent positives are noted in the HPI.  All others are negative.   ALLERGIES:  is allergic to neulasta [pegfilgrastim].  MEDICATIONS:  Current Outpatient Medications  Medication Sig Dispense Refill  . diazepam (VALIUM) 2 MG tablet Take 2 mg by mouth daily as needed for anxiety.    [name] Kitchen HYDROcodone-acetaminophen (NORCO) 5-325 MG tablet Take 1-2 tablets by mouth every 6 (six) hours as needed for moderate pain or severe pain.  (Patient not taking: Reported on 12/17/2018) 20 tablet 0  . lenalidomide (REVLIMID) 15 MG capsule Take 1 capsule (15 mg total) by mouth daily. 3 weeks on 1 week off. 21 capsule 0  . levothyroxine (SYNTHROID, LEVOTHROID) 88 MCG tablet Take 88 mcg by mouth daily before breakfast.   2  . lidocaine-prilocaine (EMLA) cream Apply 1 application topically as needed (for port access).    . Multiple Vitamin (MULTIVITAMIN WITH MINERALS) TABS tablet Take 1 tablet by mouth daily.     . ondansetron (ZOFRAN) 8 MG tablet Take 1 tablet (8 mg total) by mouth every 8 (eight) hours as needed for nausea or vomiting. 30 tablet 1  . prochlorperazine (COMPAZINE) 10 MG tablet Take 1 tablet (10 mg total) by mouth every 8 (eight) hours as needed for nausea or vomiting. 30 tablet 0  . rivaroxaban (XARELTO) 10 MG TABS tablet Take 1 tablet (10 mg total) by mouth daily. 30 tablet 5  . senna (SENOKOT) 8.6 MG TABS tablet Take 2 tablets by mouth at bedtime.    . simvastatin (ZOCOR) 20 MG tablet Take 20 mg by mouth daily.    . tamsulosin (FLOMAX) 0.4 MG CAPS capsule Take 1 capsule (0.4 mg total) by mouth at bedtime. 30 capsule 1  . temazepam (RESTORIL) 15 MG capsule Take 15 mg by mouth at bedtime as needed for sleep.  No current facility-administered medications for this visit.    Facility-Administered Medications Ordered in Other Visits  Medication Dose Route Frequency Provider Last Rate Last Dose  . heparin lock flush 100 unit/mL  500 Units Intracatheter Once PRN Brunetta Genera, MD      . riTUXimab (RITUXAN) 900 mg in sodium chloride 0.9 % 160 mL infusion  375 mg/m2 (Treatment Plan Recorded) Intravenous Once Brunetta Genera, MD      . sodium chloride flush (NS) 0.9 % injection 10 mL  10 mL Intracatheter PRN Brunetta Genera, MD        PHYSICAL EXAMINATION: ECOG FS:1 - Symptomatic but completely ambulatory  Vitals:   11/22/19 1122  BP: 124/61  Pulse: 62  Resp: 18  Temp: 99.3 F (37.4 C)  SpO2: 100%    Wt Readings from Last 3 Encounters:  11/22/19 220 lb 14.4 oz (100.2 kg)  10/21/19 224 lb 14.4 oz (102 kg)  09/23/19 222 lb 4.8 oz (100.8 kg)   Body mass index is 29.14 kg/m.     GENERAL:alert, in no acute distress and comfortable SKIN: no acute rashes, no significant lesions EYES: conjunctiva are pink and non-injected, sclera anicteric OROPHARYNX: MMM, no exudates, no oropharyngeal erythema or ulceration NECK: supple, no JVD LYMPH:  no palpable lymphadenopathy in the cervical, axillary or inguinal regions LUNGS: clear to auscultation b/l with normal respiratory effort HEART: regular rate & rhythm ABDOMEN:  normoactive bowel sounds , non tender, not distended. No palpable hepatosplenomegaly.  Extremity: no pedal edema PSYCH: alert & oriented x 3 with fluent speech NEURO: no focal motor/sensory deficits   LABORATORY DATA:   CBC Latest Ref Rng & Units 11/22/2019 10/21/2019 09/23/2019  WBC 4.0 - 10.5 K/uL 3.2(L) 3.0(L) 3.1(L)  Hemoglobin 13.0 - 17.0 g/dL 14.0 13.4 14.3  Hematocrit 39.0 - 52.0 % 40.8 38.9(L) 41.5  Platelets 150 - 400 K/uL 148(L) 123(L) 132(L)     CMP Latest Ref Rng & Units 11/22/2019 10/21/2019 09/23/2019  Glucose 70 - 99 mg/dL 84 101(H) 81  BUN 8 - 23 mg/dL _0 Creatinine 0.61 - 1.24 mg/dL 0.93 0.94 0.90  Sodium 135 - 145 mmol/L 141 142 141  Potassium 3.5 - 5.1 mmol/L 4.3 4.2 4.3  Chloride 98 - 111 mmol/L 107 109 108  CO2 22 - 32 mmol/L _1 Calcium 8.9 - 10.3 mg/dL 8.9 8.6(L) 8.8(L)  Total Protein 6.5 - 8.1 g/dL 6.3(L) 5.9(L) 6.2(L)  Total Bilirubin 0.3 - 1.2 mg/dL 0.5 0.5 0.5  Alkaline Phos 38 - 126 U/L 105 87 86  AST 15 - 41 U/L 26 39 34  ALT 0 - 44 U/L 31 38 37   . Lab Results  Component Value Date   LDH 270 (H) 10/21/2019    Fine Needle Aspiration, Lymph NodeResulted: 12/04/2018 1:14 PM [facility] Result Narrative  ACCESSION NUMBER: [id number] RECEIVED: 11/27/2018 ORDERING PHYSICIAN: RAKHEE Megan Mans , MD  PATIENT NAME: Docia Barrier LYN CYTOLOGY REPORT * Amended * Final Cytologic Interpretation AMENDED REPORT  AMENDED 12/04/2018 TO ADD DIAGNOSIS   A. Right inguinal lymph node, Fine Needle Aspiration II (smears and cell block): B-cell lymphoma with germinal center phenotype (see comment)  Specimen Adequacy: Satisfactory for evaluation.  B. Cytology core biopsy: B-cell lymphoma with germinal center phenotype (see comment)  Specimen Adequacy: Satisfactory for evaluation.    COMMENT:Sections of the core biopsy demonstrate an atypical vaguely nodular lymphoid infiltrate composed of medium sized lymphoid cells with mature chromatin, irregular  nuclear membranes and scant cytoplasm. Focally some areas show larger cells. Few mitotic figures are present. Appropriately controlled immunohistochemical stains are performed. Sheets of B-cells are highlighted by CD20. These B-cells co-express BCL2 and BCL6 (focal, nodular distribution). They are negative for CD30 and MYC. CD21 demonstrates few residual disrupted follicular dendritic meshworks. The proliferation index is low (10-20%), as demonstrated by Ki-67. Scattered small T-cells are highlighted by CD3. Concurrent flow cytometry identified a kappa light chain restricted B-cell population, co-expressing CD10.  Overall, the findings are consistent with involvement by a B-cell lymphoma with a germinal center phenotype (GC-type), with focal follicular architecture and a low proliferation index. Given the nature of the sample (i.e. core biopsy) a final architectural subclassification is not possible. If clinically feasible, an excisional biopsy is recommended.  Correlation with clinical and cytogenetic/molecular data is recommended.    12/11/18 Inguinal LN Biopsy  Interpretation Tissue-Flow Cytometry - MONOCLONAL B-CELL POPULATION EXPRESSING CD10 COMPRISES 92% OF ALL LYMPHOCYTES - SEE COMMENT Microscopic  Gated population: Flow cytometric immunophenotyping is performed using antibiodies to the antigens listed in the table below. Electronic gates are placed around a cell cluster displaying light scatter properties corresponding to lymphocytes. - Abnormal Cells in gated population: 92 % - Phenotype of Abnormal Cells: CD10, CD19, CD20, CD21, CD22, HLA-Dr, Kappa Diagnosis Lymph node for lymphoma, Deep Right Inguinal - FOLLICULAR LYMPHOMA, GRADE 1-2 - SEE COMMENT Microscopic Comment The nodal architecture is effaced by a proliferation back to back follicles composed of medium to large lymphocytes with irregular, cleaved nuclei and scattered centroblasts. There are no diffuse areas of growth present within the examined nodal material. Immunohistochemistry performed on the tissue highlights a nodular B-cell proliferation with expression of CD20, CD10, bcl-6, and Bcl-2. CD21 highlights expanded follicular dendritic meshworks. The Ki-67 shows a slightly increased proliferation rate of 30-40%. The atypical lymphocytes are negative for CD5. CD3 immunohistochemistry highlights the background T-cells. Flow cytometry was performed. A monoclonal B-cell populaton expressing CD10 comprises 92% of all lymphocytes (See [id number]). Overall, the findings are consistent with a diagnosis of low grade follicular lymphoma, Grade 1-2. Of note, the increased ki-67 may portend a more aggressive course despite the low histologic grade.   01/18/19 BM Biopsy:   05/05/19 BM Biopsy:   10/22/202 CT CHEST, ABDOMEN, AND PELVIS WITH CONTRAST (Accession [id number])    RADIOGRAPHIC STUDIES: I have personally reviewed the radiological images as listed and agreed with the findings in the report. No results found.    ASSESSMENT & PLAN:   Ethan Stewart is a 73 y.o. male with:  1) Diffuse large B-cell lymphoma stage IV AE with involvement of mesenteric, inguinal lymph nodes and C5 vertebral body with spinal cord  impingement  Patient is status post six cycles of R-CHOP and R-mini-CHOP, completed in 2016, as noted above.  2) History of capillary leak syndrome with ARDS requiring intubation likely due to G-CSF.  3) Recently diagnosed Follicular Lymphoma, Grade 1-2   11/02/18 CT A/P which revealed Negative for inguinal or bowel hernia but positive for bulky right inguinal and external iliac lymphadenopathy which is new since a 2017 PET-CT and highly suspicious for recurrent Lymphoma. This would be amenable to Ultrasound-guided needle biopsy. 2. No abdominal or contralateral left hemi pelvic lymphadenopathy. No other acute findings identified in the abdomen or pelvis. 3. Chronic pneumobilia.  Aortic Atherosclerosis.  11/12/18 PET/CT which revealed  Interval development of multistation lymphadenopathy in the abdomen and pelvis as detailed above- Deauville 5. 2. Presence of pneumobilia could reflect sphincter of  Oddi dysfunction versus recent procedure. Correlate with clinical history and upcoming comprehensive metabolic panel. 3. Ancillary CT findings   11/27/18 US guided LN Biopsy pathology report is as noted above. " B-cell lymphoma with a germinal center phenotype (GC-type), with focal follicular architecture and a low proliferation index. Given the nature of the sample (i.e. core biopsy) a final architectural subclassification is not possible. If clinically feasible, an excisional biopsy is recommended."  12/11/18 Right Inguinal LN biopsy revealed Grade 1-2 Follicular Lymphoma  07/25/37 CT A/P revealed Today's study demonstrates clear progression of disease with increased number and size of numerous enlarged lymph nodes throughout the lower thorax, abdomen and pelvis, as detailed above. 2. In addition, there are several small pulmonary nodules in the lung bases. The largest of these are clustered in the posterior aspect of the right lower lobe in an apparent area of scarring which is very similar to prior  PET-CT 03/12/2016, favored to be benign. However, the smaller nodules which measure up to 6 mm in size are nonspecific and warrant attention on follow-up studies. 3. Aortic atherosclerosis, in addition to least 2 vessel coronary artery disease. Assessment for potential risk factor modification, dietary therapy or pharmacologic therapy may be warranted, if clinically indicated. 4. Additional incidental findings, as above. 01/18/19 BM Bx revealed normocellular bone marrow with trilineage hematopoiesis Stage II indicated with 01/26/19 CT  Began C1 of '20mg'$  Revlimid and Rituxan on 02/05/19  05/05/19 BM Bx revealed normocellular bone marrow with erythroid hyperplasia, and no evidence of lymphoma  05/11/19 PET/CT revealed "Near complete metabolic response to therapy of lymphoma since the prior PET of 11/12/2018. Low-level hypermetabolism at the site of adenopathy within the abdominal retroperitoneum persists. (Deauville) 2. There has also been resolution of abdominopelvic adenopathy since the diagnostic CT of 01/26/2019. 2. Nonspecific right lower lobe pulmonary nodules, similar. 3. Coronary artery atherosclerosis. Aortic Atherosclerosis."   10/14/2019 CT C/A/P ([id number]) revealed " 1. No findings of active lymphoma. 2. Pulmonary nodules in the right lung are mostly stable from March  2017, and considered benign, although one 5 mm right lower lobe nodule medially on image 115/6 is stable of the last year but not well seen on 03/12/2016 and may merit surveillance. 3. Reduced prominence of treatment related stranding in the left periaortic region likely the residua of prior adenopathy. 4. Other imaging findings of potential clinical significance:Coronary atherosclerosis. Mild cardiomegaly. Cholelithiasis. Mild prostatomegaly. Mild lumbar spondylosis and degenerative disc disease causing mild impingement at L3-4 and L4-5. Small left renal peripelvic cysts. Prior bilateral groin hernia repairs. Pneumobilia, query prior  sphincterotomy."   4) History of right lower extremity distal DVT. Resolved. S/p 6 months of treatment in 02/2016 -Returning to '10mg'$  Xarelto while taking Revlimid  5) Mild chronic thrombocytopenia-  128k   PLAN: -Discussed pt labwork today, 11/22/19; blood counts are stable, PLTs have improved.  -Advised pt that elevated PSA levels could also be due to a prostate infection, prostate not lighting up on recent imaging studies -Advised pt that it is okay to have rpt PSA levels with PCP in 3 months   -Pt appears to have a chronically enlarged prostate  -Discussed the side effects of Flomax  -Advised pt to remain well hydrated while on Flomax and to take it at night  -No lab or clinical evidence of the progression of pt's lymphoma this time -The pt has no prohibitive toxicities from continuing C11 of Rituxan at this time.  -Plan to get repeat CT C/A/P and consider moving to maintenance after  C16 -Will get urine analysis and culture today  -Rx Flomax -Will see back in 8 weeks with labs and C13  FOLLOW UP: -Urine analysis and urine culture in lab today. -Please schedule next cycle of Rituxan as per order in 4 weeks with labs and port flush. -Please schedule subsequent cycle of Rituxan in 8 weeks with labs port flush and MD visit      The total time spent in the appt was 25 minutes and more than 50% was on counseling and direct patient cares.  All of the patient's questions were answered with apparent satisfaction. The patient knows to call the clinic with any problems, questions or concerns.   Sullivan Lone MD [place] [facility]On[facility] Phys[facility]  (Office):       [phone] (Work cell):  [phone] (Fax):           [phone]   I, Yevette Edwards, am acting as a scribe for Dr. Sullivan Lone.   .I have reviewed the above documentation for accuracy and completeness, and I agree with the above. Brunetta Genera MD     ADDENDUM  No evidence of UTI  Component     Latest Ref Rng & Units 11/22/2019  Color, Urine     YELLOW YELLOW  Appearance     CLEAR CLEAR  Specific Gravity, Urine     1.005 - 1.030 1.016  pH     5.0 - 8.0 5.0  Glucose, UA     NEGATIVE mg/dL NEGATIVE  Hgb urine dipstick     NEGATIVE NEGATIVE  Bilirubin Urine     NEGATIVE NEGATIVE  Ketones, ur     NEGATIVE mg/dL 5 (A)  Protein     NEGATIVE mg/dL NEGATIVE  Nitrite     NEGATIVE NEGATIVE  Leukocytes,Ua     NEGATIVE NEGATIVE  RBC / HPF     0 - 5 RBC/hpf 0-5  WBC, UA     0 - 5 WBC/hpf 0-5  Bacteria, UA     NONE SEEN NONE SEEN  Mucus      PRESENT  Specimen Description      URINE, CLEAN CATCH . . .  Special Requests      NONE . [name] Kitchen .  Culture      NO GROWTH . . .  Report Status      11/23/2019 FINAL

## 2019-11-22 NOTE — Progress Notes (Signed)
Okay to treat with ANC 1.3 per Dr. Irene Limbo

## 2019-11-23 ENCOUNTER — Telehealth: Payer: Self-pay | Admitting: Hematology

## 2019-11-23 LAB — URINE CULTURE: Culture: NO GROWTH

## 2019-11-23 NOTE — Telephone Encounter (Signed)
Scheduled appt per 11/30 los.  Spoke with pt and he is aware of his appt dates and time.

## 2019-11-30 ENCOUNTER — Encounter: Payer: Self-pay | Admitting: *Deleted

## 2019-11-30 NOTE — Progress Notes (Signed)
Patient called - requested [place] contact [place] to provide documentation to continue his coverage for care - he gave name Francee Nodal, [phone] as contact w/[employer]. Called Ms. Beryl Meager. She states transition has occurred and the patient's nurse navigator is Houston Siren, [phone]. Ms. Braxton Feathers requested most recent OV notes from appointments with Dr. Irene Limbo and summary statement of need for patient to continue care be faxed to [phone] - marked STAT. Documents faxed as requested. Fax confirmation received.  Contacted Ms. Salano to verify fax received. She states she will follow up with patient. Contacted Mr. Ede to provide Ms. Salano's contact number.

## 2019-12-02 ENCOUNTER — Telehealth: Payer: Self-pay | Admitting: *Deleted

## 2019-12-02 ENCOUNTER — Other Ambulatory Visit: Payer: Self-pay | Admitting: *Deleted

## 2019-12-02 DIAGNOSIS — C8298 Follicular lymphoma, unspecified, lymph nodes of multiple sites: Secondary | ICD-10-CM

## 2019-12-02 MED ORDER — LENALIDOMIDE 15 MG PO CAPS: 15.0000 mg | ORAL_CAPSULE | Freq: Every day | ORAL | 0 refills | Status: DC

## 2019-12-02 NOTE — Telephone Encounter (Addendum)
Refill Revlimid per Dr.Kale Refill faxed to [facility] in [place]. [employer] Auth# [id number], 12/02/2019

## 2019-12-02 NOTE — Telephone Encounter (Signed)
Rx for Revlimid faxed to [place] with all information as requested. Fax confirmation received.

## 2019-12-06 ENCOUNTER — Telehealth: Payer: Self-pay | Admitting: Hematology

## 2019-12-06 NOTE — Telephone Encounter (Signed)
R/s appt for patient from 12/28 to 12/26 to minimize overbooked - pt aware and agreeable to change

## 2019-12-16 ENCOUNTER — Inpatient Hospital Stay: Payer: No Typology Code available for payment source

## 2019-12-16 ENCOUNTER — Inpatient Hospital Stay: Payer: No Typology Code available for payment source | Attending: Hematology

## 2019-12-16 ENCOUNTER — Other Ambulatory Visit: Payer: Self-pay

## 2019-12-16 DIAGNOSIS — Z95828 Presence of other vascular implants and grafts: Secondary | ICD-10-CM

## 2019-12-16 DIAGNOSIS — C8298 Follicular lymphoma, unspecified, lymph nodes of multiple sites: Secondary | ICD-10-CM | POA: Insufficient documentation

## 2019-12-16 DIAGNOSIS — C8330 Diffuse large B-cell lymphoma, unspecified site: Secondary | ICD-10-CM

## 2019-12-16 DIAGNOSIS — Z5112 Encounter for antineoplastic immunotherapy: Secondary | ICD-10-CM | POA: Insufficient documentation

## 2019-12-16 DIAGNOSIS — Z298 Encounter for other specified prophylactic measures: Secondary | ICD-10-CM

## 2019-12-16 LAB — CMP (CANCER CENTER ONLY)
ALT: 34 U/L (ref 0–44)
AST: 30 U/L (ref 15–41)
Albumin: 3.7 g/dL (ref 3.5–5.0)
Alkaline Phosphatase: 98 U/L (ref 38–126)
Anion gap: 7 (ref 5–15)
BUN: 16 mg/dL (ref 8–23)
CO2: 26 mmol/L (ref 22–32)
Calcium: 8.5 mg/dL — ABNORMAL LOW (ref 8.9–10.3)
Chloride: 109 mmol/L (ref 98–111)
Creatinine: 0.93 mg/dL (ref 0.61–1.24)
GFR, Est AFR Am: >60 mL/min (ref >60)
GFR, Estimated: >60 mL/min (ref >60)
Glucose, Bld: 98 mg/dL (ref 70–99)
Potassium: 4.2 mmol/L (ref 3.5–5.1)
Sodium: 142 mmol/L (ref 135–145)
Total Bilirubin: 0.6 mg/dL (ref 0.3–1.2)
Total Protein: 5.9 g/dL — ABNORMAL LOW (ref 6.5–8.1)

## 2019-12-16 LAB — CBC WITH DIFFERENTIAL/PLATELET
Abs Immature Granulocytes: 0 10*3/uL (ref 0.00–0.07)
Basophils Absolute: 0.1 10*3/uL (ref 0.0–0.1)
Basophils Relative: 2 %
Eosinophils Absolute: 0.2 10*3/uL (ref 0.0–0.5)
Eosinophils Relative: 6 %
HCT: 38.8 % — ABNORMAL LOW (ref 39.0–52.0)
Hemoglobin: 13.3 g/dL (ref 13.0–17.0)
Immature Granulocytes: 0 %
Lymphocytes Relative: 35 %
Lymphs Abs: 0.9 10*3/uL (ref 0.7–4.0)
MCH: 30.1 pg (ref 26.0–34.0)
MCHC: 34.3 g/dL (ref 30.0–36.0)
MCV: 87.8 fL (ref 80.0–100.0)
Monocytes Absolute: 0.5 10*3/uL (ref 0.1–1.0)
Monocytes Relative: 20 %
Neutro Abs: 1 10*3/uL — ABNORMAL LOW (ref 1.7–7.7)
Neutrophils Relative %: 37 %
Platelets: 124 10*3/uL — ABNORMAL LOW (ref 150–400)
RBC: 4.42 MIL/uL (ref 4.22–5.81)
RDW: 14.2 % (ref 11.5–15.5)
WBC: 2.6 10*3/uL — ABNORMAL LOW (ref 4.0–10.5)
nRBC: 0 % (ref 0.0–0.2)

## 2019-12-16 MED ORDER — SODIUM CHLORIDE 0.9% FLUSH
10.0000 mL | Freq: Once | INTRAVENOUS | Status: AC
  Administered 2019-12-16: 08:00:00 10 mL
  Filled 2019-12-16: qty 10

## 2019-12-18 ENCOUNTER — Other Ambulatory Visit: Payer: Self-pay

## 2019-12-18 ENCOUNTER — Inpatient Hospital Stay: Payer: No Typology Code available for payment source

## 2019-12-18 VITALS — BP 133/71 | HR 55 | Temp 97.9°F | Resp 18

## 2019-12-18 DIAGNOSIS — Z5112 Encounter for antineoplastic immunotherapy: Secondary | ICD-10-CM | POA: Diagnosis not present

## 2019-12-18 DIAGNOSIS — Z95828 Presence of other vascular implants and grafts: Secondary | ICD-10-CM

## 2019-12-18 DIAGNOSIS — C8330 Diffuse large B-cell lymphoma, unspecified site: Secondary | ICD-10-CM

## 2019-12-18 MED ORDER — ACETAMINOPHEN 325 MG PO TABS
650.0000 mg | ORAL_TABLET | Freq: Once | ORAL | Status: AC
  Administered 2019-12-18: 08:00:00 650 mg via ORAL

## 2019-12-18 MED ORDER — HEPARIN SOD (PORK) LOCK FLUSH 100 UNIT/ML IV SOLN
500.0000 [IU] | Freq: Once | INTRAVENOUS | Status: AC | PRN
  Administered 2019-12-18: 11:00:00 500 [IU]
  Filled 2019-12-18: qty 5

## 2019-12-18 MED ORDER — METHYLPREDNISOLONE SODIUM SUCC 125 MG IJ SOLR
125.0000 mg | Freq: Once | INTRAMUSCULAR | Status: AC
  Administered 2019-12-18: 125 mg via INTRAVENOUS

## 2019-12-18 MED ORDER — SODIUM CHLORIDE 0.9 % IV SOLN
Freq: Once | INTRAVENOUS | Status: AC
  Filled 2019-12-18: qty 250

## 2019-12-18 MED ORDER — DIPHENHYDRAMINE HCL 25 MG PO CAPS
ORAL_CAPSULE | ORAL | Status: AC
  Filled 2019-12-18: qty 1

## 2019-12-18 MED ORDER — DIPHENHYDRAMINE HCL 25 MG PO CAPS
50.0000 mg | ORAL_CAPSULE | Freq: Once | ORAL | Status: AC
  Administered 2019-12-18: 08:00:00 50 mg via ORAL

## 2019-12-18 MED ORDER — METHYLPREDNISOLONE SODIUM SUCC 125 MG IJ SOLR
INTRAMUSCULAR | Status: AC
  Filled 2019-12-18: qty 2

## 2019-12-18 MED ORDER — SODIUM CHLORIDE 0.9% FLUSH
10.0000 mL | INTRAVENOUS | Status: DC | PRN
  Administered 2019-12-18: 11:00:00 10 mL
  Filled 2019-12-18: qty 10

## 2019-12-18 MED ORDER — SODIUM CHLORIDE 0.9% FLUSH
10.0000 mL | Freq: Once | INTRAVENOUS | Status: AC
  Administered 2019-12-18: 08:00:00 10 mL
  Filled 2019-12-18: qty 10

## 2019-12-18 MED ORDER — SODIUM CHLORIDE 0.9 % IV SOLN
375.0000 mg/m2 | Freq: Once | INTRAVENOUS | Status: AC
  Administered 2019-12-18: 900 mg via INTRAVENOUS
  Filled 2019-12-18: qty 50

## 2019-12-18 MED ORDER — ACETAMINOPHEN 325 MG PO TABS
ORAL_TABLET | ORAL | Status: AC
  Filled 2019-12-18: qty 2

## 2019-12-18 NOTE — Patient Instructions (Signed)
[facility] Discharge Instructions for Patients Receiving Chemotherapy  Today you received the following chemotherapy agents:  Rituxan.  To help prevent nausea and vomiting after your treatment, we encourage you to take your nausea medication as directed.   If you develop nausea and vomiting that is not controlled by your nausea medication, call the clinic.   BELOW ARE SYMPTOMS THAT SHOULD BE REPORTED IMMEDIATELY:  *FEVER GREATER THAN 100.5 F  *CHILLS WITH OR WITHOUT FEVER  NAUSEA AND VOMITING THAT IS NOT CONTROLLED WITH YOUR NAUSEA MEDICATION  *UNUSUAL SHORTNESS OF BREATH  *UNUSUAL BRUISING OR BLEEDING  TENDERNESS IN MOUTH AND THROAT WITH OR WITHOUT PRESENCE OF ULCERS  *URINARY PROBLEMS  *BOWEL PROBLEMS  UNUSUAL RASH Items with * indicate a potential emergency and should be followed up as soon as possible.  Feel free to call the clinic should you have any questions or concerns. The clinic phone number is [phone].  Please show the CHEMO ALERT CARD at check-in to the Emergency Department and triage nurse.   

## 2019-12-20 ENCOUNTER — Ambulatory Visit: Payer: Non-veteran care

## 2019-12-20 ENCOUNTER — Other Ambulatory Visit: Payer: Non-veteran care

## 2019-12-21 ENCOUNTER — Telehealth: Payer: Self-pay | Admitting: *Deleted

## 2019-12-21 NOTE — Telephone Encounter (Signed)
Correction to earlier contact # for Ethan Stewart, [place] nurse Navigator: [phone].  She contacted office.Per her Voice mail - she has not received earlier faxes Fax # [phone] Re-faxed letter of necessity of continuing treatment  and last 2 OV notes to Ethan Stewart at fax number provided above. LVM for her with this information.

## 2019-12-21 NOTE — Telephone Encounter (Signed)
Late entry Patient contacted office 12/20/2019. He states his [place] (pt provided this  # [phone]) did not receive fax providing info related to continuation of care on 11/30/19 that included letter of necessity from Dr. Irene Limbo and notes from past 2 OV (fax confirmation receipt received on 11/30/2019 from fax # [phone]) Ms Nadine Counts contacted at  # [phone]. Call answered by named voice mail. Voice mail left advising her that pt  information was being refaxed on 12/20/2019 to same fax # as on 11/30/19. Asked her to contact this office (number provided) if fax was not received.

## 2019-12-30 ENCOUNTER — Telehealth: Payer: Self-pay | Admitting: *Deleted

## 2019-12-30 NOTE — Telephone Encounter (Signed)
Received fax from [place] to schedule 'initial appt for patient, [place] Referral [phone] with equest for initial appt information to be faxed to [phone].  Referral issued 2019-12-28, exp date 2020-12-27 Patient is established patient of Dr. Grier Mitts, currently receiving treatment at [facility]. Contacted patient with this information. Patient states he was informed that he would receive an extension of his current care, keep current [place] referral # ([place] [id number]), not receive a new referral. He will f/u with [place] and contact office.

## 2020-01-06 ENCOUNTER — Other Ambulatory Visit: Payer: Self-pay | Admitting: *Deleted

## 2020-01-06 ENCOUNTER — Telehealth: Payer: Self-pay | Admitting: *Deleted

## 2020-01-06 NOTE — Telephone Encounter (Signed)
Contacted by Selinda Eon, RN Navigator with [facility]. She left voice mail requesting information regarding patient's treatment to coordinate care with [facility] benefits.  CB # [phone] Contacted her at number above - LVM asking what type of information was needed. Asked her to contact office Friday or Monday to provide specifics and where to send information.

## 2020-01-06 NOTE — Telephone Encounter (Signed)
Patient states he does not need a refill of Revlimid at this time, starting 7 day off of med today. Next appt in clinic is 1/25 and he wants to discuss treatment/dosing with Dr. Irene Limbo before refilling.

## 2020-01-10 ENCOUNTER — Telehealth: Payer: Self-pay | Admitting: *Deleted

## 2020-01-10 NOTE — Telephone Encounter (Signed)
Patient called to state appt for covid vaccine on 01/18/20 and wants to know if he can delay or cancel his Rituxan infusion 1/25. Per Dr. Irene Limbo, cancel rituxan -get the vaccine 1/26

## 2020-01-17 ENCOUNTER — Ambulatory Visit: Payer: Non-veteran care

## 2020-01-17 ENCOUNTER — Other Ambulatory Visit: Payer: Self-pay

## 2020-01-17 ENCOUNTER — Inpatient Hospital Stay: Payer: No Typology Code available for payment source

## 2020-01-17 ENCOUNTER — Inpatient Hospital Stay (HOSPITAL_BASED_OUTPATIENT_CLINIC_OR_DEPARTMENT_OTHER): Payer: No Typology Code available for payment source | Admitting: Hematology

## 2020-01-17 ENCOUNTER — Ambulatory Visit: Payer: Self-pay

## 2020-01-17 ENCOUNTER — Inpatient Hospital Stay: Payer: No Typology Code available for payment source | Attending: Hematology

## 2020-01-17 ENCOUNTER — Other Ambulatory Visit: Payer: Self-pay | Admitting: *Deleted

## 2020-01-17 ENCOUNTER — Telehealth: Payer: Self-pay | Admitting: *Deleted

## 2020-01-17 VITALS — BP 144/82 | HR 60 | Temp 97.8°F | Resp 18 | Ht 73.0 in | Wt 225.5 lb

## 2020-01-17 DIAGNOSIS — C8298 Follicular lymphoma, unspecified, lymph nodes of multiple sites: Secondary | ICD-10-CM

## 2020-01-17 DIAGNOSIS — Z298 Encounter for other specified prophylactic measures: Secondary | ICD-10-CM

## 2020-01-17 DIAGNOSIS — C8330 Diffuse large B-cell lymphoma, unspecified site: Secondary | ICD-10-CM

## 2020-01-17 DIAGNOSIS — Z95828 Presence of other vascular implants and grafts: Secondary | ICD-10-CM

## 2020-01-17 LAB — CMP (CANCER CENTER ONLY)
ALT: 35 U/L (ref 0–44)
AST: 29 U/L (ref 15–41)
Albumin: 4 g/dL (ref 3.5–5.0)
Alkaline Phosphatase: 109 U/L (ref 38–126)
Anion gap: 7 (ref 5–15)
BUN: 17 mg/dL (ref 8–23)
CO2: 25 mmol/L (ref 22–32)
Calcium: 8.7 mg/dL — ABNORMAL LOW (ref 8.9–10.3)
Chloride: 109 mmol/L (ref 98–111)
Creatinine: 1.05 mg/dL (ref 0.61–1.24)
GFR, Est AFR Am: >60 mL/min (ref >60)
GFR, Estimated: >60 mL/min (ref >60)
Glucose, Bld: 92 mg/dL (ref 70–99)
Potassium: 4.5 mmol/L (ref 3.5–5.1)
Sodium: 141 mmol/L (ref 135–145)
Total Bilirubin: 0.5 mg/dL (ref 0.3–1.2)
Total Protein: 6.1 g/dL — ABNORMAL LOW (ref 6.5–8.1)

## 2020-01-17 LAB — CBC WITH DIFFERENTIAL/PLATELET
Abs Immature Granulocytes: 0 10*3/uL (ref 0.00–0.07)
Basophils Absolute: 0.1 10*3/uL (ref 0.0–0.1)
Basophils Relative: 3 %
Eosinophils Absolute: 0.1 10*3/uL (ref 0.0–0.5)
Eosinophils Relative: 3 %
HCT: 41.1 % (ref 39.0–52.0)
Hemoglobin: 14.3 g/dL (ref 13.0–17.0)
Immature Granulocytes: 0 %
Lymphocytes Relative: 31 %
Lymphs Abs: 1.2 10*3/uL (ref 0.7–4.0)
MCH: 30.2 pg (ref 26.0–34.0)
MCHC: 34.8 g/dL (ref 30.0–36.0)
MCV: 86.7 fL (ref 80.0–100.0)
Monocytes Absolute: 0.7 10*3/uL (ref 0.1–1.0)
Monocytes Relative: 19 %
Neutro Abs: 1.7 10*3/uL (ref 1.7–7.7)
Neutrophils Relative %: 44 %
Platelets: 129 10*3/uL — ABNORMAL LOW (ref 150–400)
RBC: 4.74 MIL/uL (ref 4.22–5.81)
RDW: 14.1 % (ref 11.5–15.5)
WBC: 3.8 10*3/uL — ABNORMAL LOW (ref 4.0–10.5)
nRBC: 0 % (ref 0.0–0.2)

## 2020-01-17 MED ORDER — SODIUM CHLORIDE 0.9% FLUSH
10.0000 mL | Freq: Once | INTRAVENOUS | Status: AC
  Administered 2020-01-17: 10 mL
  Filled 2020-01-17: qty 10

## 2020-01-17 MED ORDER — LENALIDOMIDE 15 MG PO CAPS: 15.0000 mg | ORAL_CAPSULE | Freq: Every day | ORAL | 0 refills | Status: DC

## 2020-01-17 MED ORDER — HEPARIN SOD (PORK) LOCK FLUSH 100 UNIT/ML IV SOLN
500.0000 [IU] | Freq: Once | INTRAVENOUS | Status: AC
  Administered 2020-01-17: 500 [IU]
  Filled 2020-01-17: qty 5

## 2020-01-17 MED ORDER — ERGOCALCIFEROL 1.25 MG (50000 UT) PO CAPS: 50000.0000 [IU] | ORAL_CAPSULE | ORAL | 4 refills | Status: DC

## 2020-01-17 MED ORDER — TAMSULOSIN HCL 0.4 MG PO CAPS: 0.4000 mg | ORAL_CAPSULE | Freq: Every day | ORAL | 3 refills | Status: AC

## 2020-01-17 NOTE — Patient Instructions (Signed)

## 2020-01-17 NOTE — Telephone Encounter (Signed)
Dr. Grier Mitts office ontacted by Sharyn Lull (RN Navigator with [facility]). [facility] received faxes r/t treatment on 12/16/19. Patient has extension of original referral for care from [place]. She clarified that if patient needs admission or referrals to other specialists, contact her so that care can be authorized. Ph [phone]

## 2020-01-17 NOTE — Telephone Encounter (Signed)
Refill Revlimid per Dr. Irene Limbo OV note 01/17/20, [employer] Auth# [id number], 01/17/2020 Revlimid prescription faxed to [facility] @ [phone] with MD office cover sheet stating referral #: [facility] [id number] (new# issued 12/16/2019), with Medication list attached. Fax confirmation received

## 2020-01-17 NOTE — Progress Notes (Signed)
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 Cytogenetics and Fish showed BCL 2 positivity but negative for BCL 6 and cMYC CD10 positivity suggestive of possibly GCB subtype.  Patient received first cycle of R CHOP on 08/08/2015 and intrathecal methotrexate with hydrocortisone on 08/09/2015. Patient subsequently was admitted with ARDS likely due to neulasta -treated with high dose steroids and empirically with broad spectrum antibiotics.  2nd cycle of chemotherapy delayed to allow for rehabilitation (was due on 08/31/2015).  Received R-mini-CHOP for cycle 2 and tolerated it without significant cytopenias. Was admitted briefly for a mild lower extremity cellulitis and DVT. Cellulitis now resolved. Patient completing his oral antibiotics.  PET/CT scan on 09/29/2015 with no evidence of disease progression. Good response in his mesenteric lymph nodes. Inguinal lymph nodes on the right side.  Decreased uptake in his C5 level.  No new lesions noted.   Cycle 3 (10/02/2015)  of R- CHOP ( we increased the doxorubicin dose back to 50 mg/m and keep the cyclophosphamide dose reduction at 400 mg/m]  Cycle 4 (10/23/2015) of R-CHOP ( we increased the doxorubicin dose back to 50 mg/m and kept the cyclophosphamide dose reduction at 400 mg/m].  PET/CT 11/10/2015: Shows improvement in most lesions but is noted to have a new FDG avid lymph node in the mesentery of the small bowel.  Cycle 5 on 11/13/2015 R CHOP - received full dose.  Cycle 6 on 12/11/2015 R-CHOP full dose.  Admitted with neutropenic fevers and treatment for possible early pneumonia.  This resolved and he was able to discharge home  01/09/2016 through 01/22/2016: The patient was treated to the C4-C6 levels of the spine to a dose of 30 gray in 10 fractions using a 2 field technique. The patient was also treated to an abdominal lymph node region to a dose of 30 gray in 10 fractions using a 3 field technique. This treatment consisted of a 3-D conformal technique with daily image guidance utilized.   Repeat PET CT scan in March 12 2016 -showed some enlarged mesenteric lymph nodes which were FDG avid.  CT guided FNA of mesenteric lymph nodes at [facility] on 03/29/2016 - showed scant cellularity. No overt evidence of lymphoma.  Rpt PET/CT 05/15/2016 at [facility]- show similar appearing enlarged mesenteric lymph nodes which are FDG avid. No change in size and no other evidence of disease progression.  PET/CT 07/24/2016 - and [facility] shows no evidence of disease progression  PET/CT 01/22/2017:  Result Impression  1.Stable low-level metabolic activity in small lymph nodes in the small bowel mesentery. No new FDG avid disease. 2.Persistent hypermetabolic soft tissue thickening overlying the coccyx which could relate to a decubitus ulcer and should be amenable to direct  inspection. 3.Stable pulmonary nodules which do not appear hypermetabolic, however, several are below the size resolution of PET in size. Continued attention on follow-up is suggested. 4.Previously seen hypermetabolic skin thickening near the right ear has resolved. 5.Ancillary CT findings as above.    INTERVAL HISTORY:   [name] is here for management and evaluation of [name], grade 1-2. Pt is here after C16 of Rituxan. The patient's last visit with [place] was on 11/22/2019. The pt reports that he is doing well overall.  The pt reports that he is frustrated by the difficulty of receiving a COVID19 vaccine. He and his family have continued to try and remain safe in the midst of the pandemic. Pt would like to hold Revlimid until he gets his COVID19 vaccine.   Pt notes that Flomax has been helping him urinate. He has also been having some discomfort in the top of his left foot and is concerned about having a stress fracture. It is most bothersome at night. He has not been able to get his daily walks in due to the discomfort. Pt is currently taking a daily multivitamin.   Lab results today (01/17/20) of CBC w/diff and CMP is as follows: all values are WNL except for WBC at 3.8K, PLT at 129K, Calcium at 8.7, Total Protein at 6.1.  On review of systems, pt reports left foot pain and denies mouth sores, fevers, chills, night sweats, cough, new lumps/bumps, pain at port site, abdominal pain, leg swelling and any other symptoms.    REVIEW OF SYSTEMS:   A 10+ POINT REVIEW OF SYSTEMS WAS OBTAINED including neurology, dermatology, psychiatry, cardiac, respiratory, lymph, extremities, GI, GU, Musculoskeletal, constitutional, breasts, reproductive, HEENT.  All pertinent positives are noted in the HPI.  All others are negative.   ALLERGIES:  is allergic to neulasta [pegfilgrastim].  MEDICATIONS:  Current Outpatient Medications  Medication Sig Dispense Refill  .  diazepam (VALIUM) 2 MG tablet Take 2 mg by mouth daily as needed for anxiety.    [name] Kitchen lenalidomide (REVLIMID) 15 MG capsule Take 1 capsule (15 mg total) by mouth daily. 3 weeks on 1 week off. 21 capsule 0  . levothyroxine (SYNTHROID, LEVOTHROID) 88 MCG tablet Take 88 mcg by mouth daily before breakfast.   2  . lidocaine-prilocaine (EMLA) cream Apply 1 application topically as needed (for port access).    . Multiple Vitamin (MULTIVITAMIN WITH MINERALS) TABS tablet Take 1 tablet by mouth daily.     . polyethylene glycol (MIRALAX / GLYCOLAX) 17 g packet Take 17 g by mouth daily. Takes with revlimid    . rivaroxaban (XARELTO) 10 MG TABS tablet Take 1 tablet (10 mg total) by mouth daily. 30 tablet 5  . simvastatin (ZOCOR) 20 MG tablet Take 20 mg by mouth daily.    . tamsulosin (FLOMAX) 0.4 MG CAPS capsule Take 1 capsule (0.4 mg total) by mouth at bedtime. 30 capsule 3  . temazepam (RESTORIL) 15 MG capsule Take 15 mg by mouth at bedtime as needed for sleep.     . ergocalciferol (VITAMIN D2) 1.25 MG (50000 UT) capsule Take 1 capsule (50,000 Units total) by mouth once a week. 12 capsule 4  . HYDROcodone-acetaminophen (NORCO) 5-325 MG tablet Take 1-2 tablets by mouth every  6 (six) hours as needed for moderate pain or severe pain. (Patient not taking: Reported on 01/17/2020) 20 tablet 0  . ondansetron (ZOFRAN) 8 MG tablet Take 1 tablet (8 mg total) by mouth every 8 (eight) hours as needed for nausea or vomiting. (Patient not taking: Reported on 01/17/2020) 30 tablet 1  . prochlorperazine (COMPAZINE) 10 MG tablet Take 1 tablet (10 mg total) by mouth every 8 (eight) hours as needed for nausea or vomiting. (Patient not taking: Reported on 01/17/2020) 30 tablet 0  . senna (SENOKOT) 8.6 MG TABS tablet Take 2 tablets by mouth at bedtime.     No current facility-administered medications for this visit.    PHYSICAL EXAMINATION: ECOG FS:1 - Symptomatic but completely ambulatory  Vitals:   01/17/20 0952  BP: (!)  144/82  Pulse: 60  Resp: 18  Temp: 97.8 F (36.6 C)  SpO2: 99%   Wt Readings from Last 3 Encounters:  01/17/20 225 lb 8 oz (102.3 kg)  11/22/19 220 lb 14.4 oz (100.2 kg)  10/21/19 224 lb 14.4 oz (102 kg)   Body mass index is 29.75 kg/m.    GENERAL:alert, in no acute distress and comfortable SKIN: no acute rashes, no significant lesions EYES: conjunctiva are pink and non-injected, sclera anicteric OROPHARYNX: MMM, no exudates, no oropharyngeal erythema or ulceration NECK: supple, no JVD LYMPH:  no palpable lymphadenopathy in the cervical, axillary or inguinal regions LUNGS: clear to auscultation b/l with normal respiratory effort HEART: regular rate & rhythm ABDOMEN:  normoactive bowel sounds , non tender, not distended. No palpable hepatosplenomegaly.  Extremity: no pedal edema PSYCH: alert & oriented x 3 with fluent speech NEURO: no focal motor/sensory deficits   LABORATORY DATA:   CBC Latest Ref Rng & Units 01/17/2020 12/16/2019 11/22/2019  WBC 4.0 - 10.5 K/uL 3.8(L) 2.6(L) 3.2(L)  Hemoglobin 13.0 - 17.0 g/dL 14.3 13.3 14.0  Hematocrit 39.0 - 52.0 % 41.1 38.8(L) 40.8  Platelets 150 - 400 K/uL 129(L) 124(L) 148(L)     CMP Latest Ref Rng & Units 01/17/2020 12/16/2019 11/22/2019  Glucose 70 - 99 mg/dL 92 98 84  BUN 8 - 23 mg/dL '17 16 17  ' Creatinine 0.61 - 1.24 mg/dL 1.05 0.93 0.93  Sodium 135 - 145 mmol/L 141 142 141  Potassium 3.5 - 5.1 mmol/L 4.5 4.2 4.3  Chloride 98 - 111 mmol/L 109 109 107  CO2 22 - 32 mmol/L '25 26 26  ' Calcium 8.9 - 10.3 mg/dL 8.7(L) 8.5(L) 8.9  Total Protein 6.5 - 8.1 g/dL 6.1(L) 5.9(L) 6.3(L)  Total Bilirubin 0.3 - 1.2 mg/dL 0.5 0.6 0.5  Alkaline Phos 38 - 126 U/L 109 98 105  AST 15 - 41 U/L '29 30 26  ' ALT 0 - 44 U/L 35 34 31   . Lab Results  Component Value Date   LDH 270 (H) 10/21/2019    Fine Needle Aspiration, Lymph NodeResulted: 12/04/2018 1:14 PM [facility] Result Narrative  ACCESSION NUMBER:  [id number] RECEIVED: 11/27/2018 ORDERING PHYSICIAN: RAKHEE Megan Mans , MD PATIENT NAME: Ethan Stewart CYTOLOGY REPORT * Amended * Final Cytologic Interpretation AMENDED REPORT  AMENDED 12/04/2018 TO ADD DIAGNOSIS   A. Right inguinal lymph node, Fine Needle Aspiration II (smears and cell block): B-cell lymphoma with germinal center phenotype (see comment)  Specimen Adequacy: Satisfactory for evaluation.  B. Cytology core biopsy: B-cell lymphoma with germinal center phenotype (see comment)  Specimen Adequacy: Satisfactory for evaluation.    COMMENT:Sections of the core biopsy demonstrate an atypical vaguely  nodular lymphoid infiltrate composed of medium sized lymphoid cells with mature chromatin, irregular nuclear membranes and scant cytoplasm. Focally some areas show larger cells. Few mitotic figures are present. Appropriately controlled immunohistochemical stains are performed. Sheets of B-cells are highlighted by CD20. These B-cells co-express BCL2 and BCL6 (focal, nodular distribution). They are negative for CD30 and MYC. CD21 demonstrates few residual disrupted follicular dendritic meshworks. The proliferation index is low (10-20%), as demonstrated by Ki-67. Scattered small T-cells are highlighted by CD3. Concurrent flow cytometry identified a kappa light chain restricted B-cell population, co-expressing CD10.  Overall, the findings are consistent with involvement by a B-cell lymphoma with a germinal center phenotype (GC-type), with focal follicular architecture and a low proliferation index. Given the nature of the sample (i.e. core biopsy) a final architectural subclassification is not possible. If clinically feasible, an excisional biopsy is recommended.  Correlation with clinical and cytogenetic/molecular data is recommended.    12/11/18 Inguinal LN Biopsy  Interpretation Tissue-Flow Cytometry - MONOCLONAL B-CELL POPULATION  EXPRESSING CD10 COMPRISES 92% OF ALL LYMPHOCYTES - SEE COMMENT Microscopic Gated population: Flow cytometric immunophenotyping is performed using antibiodies to the antigens listed in the table below. Electronic gates are placed around a cell cluster displaying light scatter properties corresponding to lymphocytes. - Abnormal Cells in gated population: 92 % - Phenotype of Abnormal Cells: CD10, CD19, CD20, CD21, CD22, HLA-Dr, Kappa Diagnosis Lymph node for lymphoma, Deep Right Inguinal - FOLLICULAR LYMPHOMA, GRADE 1-2 - SEE COMMENT Microscopic Comment The nodal architecture is effaced by a proliferation back to back follicles composed of medium to large lymphocytes with irregular, cleaved nuclei and scattered centroblasts. There are no diffuse areas of growth present within the examined nodal material. Immunohistochemistry performed on the tissue highlights a nodular B-cell proliferation with expression of CD20, CD10, bcl-6, and Bcl-2. CD21 highlights expanded follicular dendritic meshworks. The Ki-67 shows a slightly increased proliferation rate of 30-40%. The atypical lymphocytes are negative for CD5. CD3 immunohistochemistry highlights the background T-cells. Flow cytometry was performed. A monoclonal B-cell populaton expressing CD10 comprises 92% of all lymphocytes (See [phone]). Overall, the findings are consistent with a diagnosis of low grade follicular lymphoma, Grade 1-2. Of note, the increased ki-67 may portend a more aggressive course despite the low histologic grade.   01/18/19 BM Biopsy:   05/05/19 BM Biopsy:   10/22/202 CT CHEST, ABDOMEN, AND PELVIS WITH CONTRAST (Accession [id number])    RADIOGRAPHIC STUDIES: I have personally reviewed the radiological images as listed and agreed with the findings in the report. No results found.    ASSESSMENT & PLAN:   Ethan Stewart is a 74 y.o. male with:  1) Diffuse large B-cell lymphoma stage IV AE with involvement  of mesenteric, inguinal lymph nodes and C5 vertebral body with spinal cord impingement  Patient is status post six cycles of R-CHOP and R-mini-CHOP, completed in 2016, as noted above.  2) History of capillary leak syndrome with ARDS requiring intubation likely due to G-CSF.  3) Recently diagnosed Follicular Lymphoma, Grade 1-2   11/02/18 CT A/P which revealed Negative for inguinal or bowel hernia but positive for bulky right inguinal and external iliac lymphadenopathy which is new since a 2017 PET-CT and highly suspicious for recurrent Lymphoma. This would be amenable to Ultrasound-guided needle biopsy. 2. No abdominal or contralateral left hemi pelvic lymphadenopathy. No other acute findings identified in the abdomen or pelvis. 3. Chronic pneumobilia.  Aortic Atherosclerosis.  11/12/18 PET/CT which revealed  Interval development of multistation lymphadenopathy in the abdomen and pelvis  as detailed above- De[place]ville 5. 2. Presence of pneumobilia could reflect sphincter of Oddi dysfunction versus recent procedure. Correlate with clinical history and upcoming comprehensive metabolic panel. 3. Ancillary CT findings   11/27/18 US guided LN Biopsy pathology report is as noted above. " B-cell lymphoma with a germinal center phenotype (GC-type), with focal follicular architecture and a low proliferation index. Given the nature of the sample (i.e. core biopsy) a final architectural subclassification is not possible. If clinically feasible, an excisional biopsy is recommended."  12/11/18 Right Inguinal LN biopsy revealed Grade 1-2 Follicular Lymphoma  07/30/67 CT A/P revealed Today's study demonstrates clear progression of disease with increased number and size of numerous enlarged lymph nodes throughout the lower thorax, abdomen and pelvis, as detailed above. 2. In addition, there are several small pulmonary nodules in the lung bases. The largest of these are clustered in the posterior aspect of the right  lower lobe in an apparent area of scarring which is very similar to prior PET-CT 03/12/2016, favored to be benign. However, the smaller nodules which measure up to 6 mm in size are nonspecific and warrant attention on follow-up studies. 3. Aortic atherosclerosis, in addition to least 2 vessel coronary artery disease. Assessment for potential risk factor modification, dietary therapy or pharmacologic therapy may be warranted, if clinically indicated. 4. Additional incidental findings, as above. 01/18/19 BM Bx revealed normocellular bone marrow with trilineage hematopoiesis Stage II indicated with 01/26/19 CT  Began C1 of 74m Revlimid and Rituxan on 02/05/19  05/05/19 BM Bx revealed normocellular bone marrow with erythroid hyperplasia, and no evidence of lymphoma  05/11/19 PET/CT revealed "Near complete metabolic response to therapy of lymphoma since the prior PET of 11/12/2018. Low-level hypermetabolism at the site of adenopathy within the abdominal retroperitoneum persists. (Deauville) 2. There has also been resolution of abdominopelvic adenopathy since the diagnostic CT of 01/26/2019. 2. Nonspecific right lower lobe pulmonary nodules, similar. 3. Coronary artery atherosclerosis. Aortic Atherosclerosis."   10/14/2019 CT C/A/P ([id number] revealed " 1. No findings of active lymphoma. 2. Pulmonary nodules in the right lung are mostly stable from March  2017, and considered benign, although one 5 mm right lower lobe nodule medially on image 115/6 is stable of the last year but not well seen on 03/12/2016 and may merit surveillance. 3. Reduced prominence of treatment related stranding in the left periaortic region likely the residua of prior adenopathy. 4. Other imaging findings of potential clinical significance:Coronary atherosclerosis. Mild cardiomegaly. Cholelithiasis. Mild prostatomegaly. Mild lumbar spondylosis and degenerative disc disease causing mild impingement at L3-4 and L4-5. Small left renal  peripelvic cysts. Prior bilateral groin hernia repairs. Pneumobilia, query prior sphincterotomy."   4) History of right lower extremity distal DVT. Resolved. S/p 6 months of treatment in 02/2016 -Returning to 154mXarelto while taking Revlimid  5) Mild chronic thrombocytopenia-  128k   PLAN: -Discussed pt labwork today, 01/17/20; blood counts and chemistries are good -Advised pt that holding Revlimid is his choice but we are unsure when he will be able to receive the COVID19 vaccine -Not unreasonable to hold Revlimid for one month until his next cycle of Rituxan -Will hold C16 Rituxan today for COVID19 vaccine and per patients preference. -No lab or clinical evidence of the progression of pt's lymphoma this time -Plan to get repeat CT C/A/P and consider moving to maintenance after C16 -Pt appears to have a chronically enlarged prostate  -Continue Zarelto -Refill Flomax, Rx Ergocalciferol  -Recommend pt f/u with PCP for benign prostatic hyperplasia management -Will  see back in 4 weeks with labs, next cycle of Rituxan  FOLLOW UP: No Rituxan today Plz schedule next cycle of Rituxan , portflush, labs and MD visit in 4 weeks      The total time spent in the appt was 20 minutes and more than 50% was on counseling and direct patient cares.  All of the patient's questions were answered with apparent satisfaction. The patient knows to call the clinic with any problems, questions or concerns.   Sullivan Lone MD [place] Hematology/Oncology Physician [facility]  (Office):       [phone] (Work cell):  [phone] (Fax):           [phone]   I, Yevette Edwards, am acting as a scribe for Dr. Sullivan Lone.   .I have reviewed the above documentation for accuracy and completeness, and I agree with the above. Brunetta Genera MD

## 2020-01-18 ENCOUNTER — Telehealth: Payer: Self-pay | Admitting: Hematology

## 2020-01-18 ENCOUNTER — Telehealth: Payer: Self-pay | Admitting: *Deleted

## 2020-01-18 ENCOUNTER — Ambulatory Visit: Payer: Self-pay

## 2020-01-18 NOTE — Telephone Encounter (Signed)
Scheduled per 01/25 los, patient has been called and notified. 

## 2020-01-18 NOTE — Telephone Encounter (Signed)
Faxed new Revlimid RX with [employer] Auth#[id number](01/17/2020), Med list, [facility] Revlimid form with new VA# [id number] (12/16/2019) to [facility]: [employer], [place]. Fax confirmation received.

## 2020-01-19 DIAGNOSIS — N400 Enlarged prostate without lower urinary tract symptoms: Secondary | ICD-10-CM | POA: Diagnosis not present

## 2020-01-19 DIAGNOSIS — R030 Elevated blood-pressure reading, without diagnosis of hypertension: Secondary | ICD-10-CM | POA: Diagnosis not present

## 2020-01-19 DIAGNOSIS — M79672 Pain in left foot: Secondary | ICD-10-CM | POA: Diagnosis not present

## 2020-01-19 DIAGNOSIS — R69 Illness, unspecified: Secondary | ICD-10-CM | POA: Diagnosis not present

## 2020-01-24 DIAGNOSIS — E7800 Pure hypercholesterolemia, unspecified: Secondary | ICD-10-CM | POA: Diagnosis not present

## 2020-01-24 DIAGNOSIS — N400 Enlarged prostate without lower urinary tract symptoms: Secondary | ICD-10-CM | POA: Diagnosis not present

## 2020-01-24 DIAGNOSIS — E038 Other specified hypothyroidism: Secondary | ICD-10-CM | POA: Diagnosis not present

## 2020-01-27 ENCOUNTER — Ambulatory Visit: Payer: Self-pay

## 2020-01-31 ENCOUNTER — Other Ambulatory Visit: Payer: Self-pay | Admitting: *Deleted

## 2020-01-31 DIAGNOSIS — C8298 Follicular lymphoma, unspecified, lymph nodes of multiple sites: Secondary | ICD-10-CM

## 2020-01-31 MED ORDER — RIVAROXABAN 10 MG PO TABS: 10.0000 mg | ORAL_TABLET | Freq: Every day | ORAL | 4 refills | Status: DC

## 2020-01-31 NOTE — Telephone Encounter (Signed)
Patient requested refill of Xarelto. Refilled per Dr. Irene Limbo OV note 01/17/2020.   Called Xarelto refill to [facility] in [place]. Refill information given to [place].

## 2020-02-09 ENCOUNTER — Telehealth: Payer: Self-pay | Admitting: *Deleted

## 2020-02-09 DIAGNOSIS — R972 Elevated prostate specific antigen [PSA]: Secondary | ICD-10-CM | POA: Diagnosis not present

## 2020-02-09 DIAGNOSIS — C8298 Follicular lymphoma, unspecified, lymph nodes of multiple sites: Secondary | ICD-10-CM

## 2020-02-09 MED ORDER — RIVAROXABAN 10 MG PO TABS: 10.0000 mg | ORAL_TABLET | Freq: Every day | ORAL | 4 refills | Status: DC

## 2020-02-09 NOTE — Telephone Encounter (Signed)
Patient called - has been out of Xarelto for a few days. [place] states never received it. [facility] is requesting medication prescription be faxed to [phone] Refill called on 01/31/20 to [facility]. RX info given to [employer] at  [address] [place], [place]- phone [phone] (opt 1)

## 2020-02-10 ENCOUNTER — Telehealth: Payer: Self-pay | Admitting: *Deleted

## 2020-02-10 NOTE — Telephone Encounter (Signed)
Prescription for Xarelto faxed to [facility] [phone] 2/18 11am - fax confirmation received. Patient notified of same

## 2020-02-10 NOTE — Telephone Encounter (Signed)
Opened in error

## 2020-02-16 ENCOUNTER — Inpatient Hospital Stay (HOSPITAL_BASED_OUTPATIENT_CLINIC_OR_DEPARTMENT_OTHER): Payer: No Typology Code available for payment source | Admitting: Hematology

## 2020-02-16 ENCOUNTER — Inpatient Hospital Stay: Payer: No Typology Code available for payment source | Attending: Hematology

## 2020-02-16 ENCOUNTER — Inpatient Hospital Stay: Payer: No Typology Code available for payment source

## 2020-02-16 ENCOUNTER — Other Ambulatory Visit: Payer: Self-pay

## 2020-02-16 VITALS — BP 159/87 | HR 63 | Temp 98.5°F | Resp 18 | Ht 73.0 in | Wt 229.8 lb

## 2020-02-16 VITALS — BP 145/86 | HR 58 | Resp 18

## 2020-02-16 DIAGNOSIS — Z5112 Encounter for antineoplastic immunotherapy: Secondary | ICD-10-CM | POA: Insufficient documentation

## 2020-02-16 DIAGNOSIS — C8330 Diffuse large B-cell lymphoma, unspecified site: Secondary | ICD-10-CM

## 2020-02-16 DIAGNOSIS — C8298 Follicular lymphoma, unspecified, lymph nodes of multiple sites: Secondary | ICD-10-CM

## 2020-02-16 DIAGNOSIS — Z95828 Presence of other vascular implants and grafts: Secondary | ICD-10-CM

## 2020-02-16 LAB — CBC WITH DIFFERENTIAL/PLATELET
Abs Immature Granulocytes: 0.01 10*3/uL (ref 0.00–0.07)
Basophils Absolute: 0 10*3/uL (ref 0.0–0.1)
Basophils Relative: 1 %
Eosinophils Absolute: 0.1 10*3/uL (ref 0.0–0.5)
Eosinophils Relative: 3 %
HCT: 41.3 % (ref 39.0–52.0)
Hemoglobin: 14.4 g/dL (ref 13.0–17.0)
Immature Granulocytes: 0 %
Lymphocytes Relative: 31 %
Lymphs Abs: 1.1 10*3/uL (ref 0.7–4.0)
MCH: 30.9 pg (ref 26.0–34.0)
MCHC: 34.9 g/dL (ref 30.0–36.0)
MCV: 88.6 fL (ref 80.0–100.0)
Monocytes Absolute: 0.5 10*3/uL (ref 0.1–1.0)
Monocytes Relative: 15 %
Neutro Abs: 1.8 10*3/uL (ref 1.7–7.7)
Neutrophils Relative %: 50 %
Platelets: 107 10*3/uL — ABNORMAL LOW (ref 150–400)
RBC: 4.66 MIL/uL (ref 4.22–5.81)
RDW: 14.1 % (ref 11.5–15.5)
WBC: 3.5 10*3/uL — ABNORMAL LOW (ref 4.0–10.5)
nRBC: 0 % (ref 0.0–0.2)

## 2020-02-16 LAB — CMP (CANCER CENTER ONLY)
ALT: 25 U/L (ref 0–44)
AST: 23 U/L (ref 15–41)
Albumin: 4.1 g/dL (ref 3.5–5.0)
Alkaline Phosphatase: 118 U/L (ref 38–126)
Anion gap: 8 (ref 5–15)
BUN: 17 mg/dL (ref 8–23)
CO2: 27 mmol/L (ref 22–32)
Calcium: 8.9 mg/dL (ref 8.9–10.3)
Chloride: 108 mmol/L (ref 98–111)
Creatinine: 1.04 mg/dL (ref 0.61–1.24)
GFR, Est AFR Am: >60 mL/min (ref >60)
GFR, Estimated: >60 mL/min (ref >60)
Glucose, Bld: 86 mg/dL (ref 70–99)
Potassium: 4.1 mmol/L (ref 3.5–5.1)
Sodium: 143 mmol/L (ref 135–145)
Total Bilirubin: 0.5 mg/dL (ref 0.3–1.2)
Total Protein: 6.2 g/dL — ABNORMAL LOW (ref 6.5–8.1)

## 2020-02-16 LAB — VITAMIN D 25 HYDROXY (VIT D DEFICIENCY, FRACTURES): Vit D, 25-Hydroxy: 27.84 ng/mL — ABNORMAL LOW (ref 30–100)

## 2020-02-16 MED ORDER — METHYLPREDNISOLONE SODIUM SUCC 125 MG IJ SOLR
125.0000 mg | Freq: Once | INTRAMUSCULAR | Status: AC
  Administered 2020-02-16: 125 mg via INTRAVENOUS

## 2020-02-16 MED ORDER — ACETAMINOPHEN 325 MG PO TABS
ORAL_TABLET | ORAL | Status: AC
  Filled 2020-02-16: qty 2

## 2020-02-16 MED ORDER — HEPARIN SOD (PORK) LOCK FLUSH 100 UNIT/ML IV SOLN
500.0000 [IU] | Freq: Once | INTRAVENOUS | Status: AC | PRN
  Administered 2020-02-16: 500 [IU]
  Filled 2020-02-16: qty 5

## 2020-02-16 MED ORDER — SODIUM CHLORIDE 0.9 % IV SOLN
375.0000 mg/m2 | Freq: Once | INTRAVENOUS | Status: AC
  Administered 2020-02-16: 900 mg via INTRAVENOUS
  Filled 2020-02-16: qty 40

## 2020-02-16 MED ORDER — SODIUM CHLORIDE 0.9% FLUSH
10.0000 mL | Freq: Once | INTRAVENOUS | Status: AC
  Administered 2020-02-16: 10 mL
  Filled 2020-02-16: qty 10

## 2020-02-16 MED ORDER — SODIUM CHLORIDE 0.9% FLUSH
10.0000 mL | INTRAVENOUS | Status: DC | PRN
  Administered 2020-02-16: 10 mL
  Filled 2020-02-16: qty 10

## 2020-02-16 MED ORDER — DIPHENHYDRAMINE HCL 25 MG PO CAPS
ORAL_CAPSULE | ORAL | Status: AC
  Filled 2020-02-16: qty 2

## 2020-02-16 MED ORDER — ACETAMINOPHEN 325 MG PO TABS
650.0000 mg | ORAL_TABLET | Freq: Once | ORAL | Status: AC
  Administered 2020-02-16: 650 mg via ORAL

## 2020-02-16 MED ORDER — METHYLPREDNISOLONE SODIUM SUCC 125 MG IJ SOLR
INTRAMUSCULAR | Status: AC
  Filled 2020-02-16: qty 2

## 2020-02-16 MED ORDER — DIPHENHYDRAMINE HCL 25 MG PO CAPS
50.0000 mg | ORAL_CAPSULE | Freq: Once | ORAL | Status: AC
  Administered 2020-02-16: 50 mg via ORAL

## 2020-02-16 MED ORDER — SODIUM CHLORIDE 0.9 % IV SOLN
Freq: Once | INTRAVENOUS | Status: AC
  Filled 2020-02-16: qty 250

## 2020-02-16 NOTE — Progress Notes (Signed)
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 4) left neck basal cell carcinoma- removed recently  Current treatment - Revlimid + Rituxan for follicular lymphoma  PreviousTreatment:  -Status post 6 cycles of R CHOP (dose reductions for cycle 2-3)  -Cannot use G-CSF due to issues with severe G-CSF related ARDS after cycle 1 (requiring prolonged intubation and ventilatory support)   ONCOLOGIC HISTORY  Diffuse large B-cell lymphoma stage IV AE with involvement of mesenteric, inguinal lymph nodes and C5 vertebral body with spinal cord impingement   He had a C5 corpectomy on 07/26/2015 that showed diffuse large B-cell lymphoma with a Ki-67 ranging from 20 to 90%. Cytogenetics and Fish showed BCL 2 positivity but negative for BCL 6 and cMYC CD10 positivity suggestive of possibly GCB subtype.  Patient received first cycle of R CHOP on 08/08/2015 and intrathecal methotrexate with hydrocortisone on 08/09/2015. Patient subsequently was admitted with ARDS likely due to neulasta -treated with high dose steroids and empirically with broad spectrum antibiotics.  2nd cycle of chemotherapy delayed to allow for rehabilitation (was due on 08/31/2015).  Received R-mini-CHOP for cycle 2 and tolerated it without significant cytopenias. Was admitted briefly for a mild lower extremity cellulitis and DVT. Cellulitis now resolved. Patient completing his oral antibiotics.  PET/CT scan on 09/29/2015 with no evidence of disease progression. Good response in his mesenteric lymph nodes. Inguinal lymph  nodes on the right side. Decreased uptake in his C5 level.  No new lesions noted.   Cycle 3 (10/02/2015)  of R- CHOP ( we increased the doxorubicin dose back to 50 mg/m and keep the cyclophosphamide dose reduction at 400 mg/m]  Cycle 4 (10/23/2015) of R-CHOP ( we increased the doxorubicin dose back to 50 mg/m and kept the cyclophosphamide dose reduction at 400 mg/m].  PET/CT 11/10/2015: Shows improvement in most lesions but is noted to have a new FDG avid lymph node in the mesentery of the small bowel.  Cycle 5 on 11/13/2015 R CHOP - received full dose.  Cycle 6 on 12/11/2015 R-CHOP full dose.  Admitted with neutropenic fevers and treatment for possible early pneumonia.  This resolved and he was able to discharge home  01/09/2016 through 01/22/2016: The patient was treated to the C4-C6 levels of the spine to a dose of 30 gray in 10 fractions using a 2 field technique. The patient was also treated to an abdominal lymph node region to a dose of 30 gray in 10 fractions using a 3 field technique. This treatment consisted of a 3-D conformal technique with daily image guidance utilized.   Repeat PET CT scan in March 12 2016 -showed some enlarged mesenteric lymph nodes which were FDG avid.  CT guided FNA of mesenteric lymph nodes at [facility] on 03/29/2016 - showed scant cellularity. No overt evidence of lymphoma.  Rpt PET/CT 05/15/2016 at [facility]- show similar appearing enlarged mesenteric lymph nodes which are FDG avid. No change in size and no other evidence of disease progression.  PET/CT 07/24/2016 - and [facility] shows no evidence of disease progression  PET/CT 01/22/2017:  Result Impression   1.Stable low-level metabolic activity in small lymph nodes in the small bowel mesentery. No new FDG avid disease. 2.Persistent hypermetabolic soft tissue thickening overlying the coccyx which could relate to a decubitus ulcer and should be amenable to  direct inspection. 3.Stable pulmonary nodules which do not appear hypermetabolic, however, several are below the size resolution of PET in size. Continued attention on follow-up is suggested. 4.Previously seen hypermetabolic skin thickening near the right ear has resolved. 5.Ancillary CT findings as above.    INTERVAL HISTORY:   Ethan Stewart is here for management and evaluation of [name], grade 1-2. Pt is here for C16 of Rituxan. The patient's last visit with [place] was on 01/17/2020. The pt reports that he is doing well overall.  The pt reports that he has had both doses of the COVID19 vaccine and denies any symptoms or concerns. Pt plans to restart Revlimid tonight. Pt has a stress fracture on his left foot, which is impairing his ability to walk for exercise.  Lab results today (02/16/20) of CBC w/diff and CMP is as follows: all values are WNL except for WBC at 3.5K, PLT at 107K, Total Protein at 6.2. 02/16/2020 Vitamin D25 Hydroxy at 27.84  On review of systems, pt denies new lumps/bumps, fevers, chills, night sweats, abdominal pain and any other symptoms.    REVIEW OF SYSTEMS:   A 10+ POINT REVIEW OF SYSTEMS WAS OBTAINED including neurology, dermatology, psychiatry, cardiac, respiratory, lymph, extremities, GI, GU, Musculoskeletal, constitutional, breasts, reproductive, HEENT.  All pertinent positives are noted in the HPI.  All others are negative.   ALLERGIES:  is allergic to neulasta [pegfilgrastim].  MEDICATIONS:  Current Outpatient Medications  Medication Sig Dispense Refill  . diazepam (VALIUM) 2 MG tablet Take 2 mg by mouth daily as needed for anxiety.    . ergocalciferol (VITAMIN D2) 1.25 MG (50000 UT) capsule Take 1 capsule (50,000 Units total) by mouth once a week. 12 capsule 4  . lenalidomide (REVLIMID) 15 MG capsule Take 1 capsule (15 mg total) by mouth daily. 3 weeks on 1 week off. 21 capsule 0  . levothyroxine (SYNTHROID, LEVOTHROID)  88 MCG tablet Take 88 mcg by mouth daily before breakfast.   2  . lidocaine-prilocaine (EMLA) cream Apply 1 application topically as needed (for port access).    . Multiple Vitamin (MULTIVITAMIN WITH MINERALS) TABS tablet Take 1 tablet by mouth daily.     . polyethylene glycol (MIRALAX / GLYCOLAX) 17 g packet Take 17 g by mouth daily. Takes with revlimid    . rivaroxaban (XARELTO) 10 MG TABS tablet Take 1 tablet (10 mg total) by mouth daily. 30 tablet 4  . senna (SENOKOT) 8.6 MG TABS tablet Take 2 tablets by mouth at bedtime.    . simvastatin (ZOCOR) 20 MG tablet Take 20 mg by mouth daily.    . tamsulosin (FLOMAX) 0.4 MG CAPS capsule Take 1 capsule (0.4 mg total) by mouth at bedtime. 30 capsule 3  . temazepam (RESTORIL) 15 MG capsule Take 15 mg by mouth at bedtime as needed for sleep.     [name] Kitchen HYDROcodone-acetaminophen (NORCO) 5-325 MG tablet Take 1-2 tablets by mouth every 6 (six) hours as needed for moderate pain or severe pain. (Patient not taking: Reported on 01/17/2020) 20 tablet 0  . ondansetron (ZOFRAN) 8 MG tablet Take 1 tablet (8 mg total) by mouth every 8 (eight) hours as needed for nausea or vomiting. (Patient not taking: Reported on 01/17/2020) 30 tablet  1  . prochlorperazine (COMPAZINE) 10 MG tablet Take 1 tablet (10 mg total) by mouth every 8 (eight) hours as needed for nausea or vomiting. (Patient not taking: Reported on 01/17/2020) 30 tablet 0   No current facility-administered medications for this visit.   Facility-Administered Medications Ordered in Other Visits  Medication Dose Route Frequency Provider Last Rate Last Admin  . sodium chloride flush (NS) 0.9 % injection 10 mL  10 mL Intracatheter PRN Brunetta Genera, MD   10 mL at 02/16/20 1231    PHYSICAL EXAMINATION: ECOG FS:1 - Symptomatic but completely ambulatory  Vitals:   02/16/20 0904  BP: (!) 159/87  Pulse: 63  Resp: 18  Temp: 98.5 F (36.9 C)  SpO2: 99%   Wt Readings from Last 3 Encounters:  02/16/20 229 lb  12.8 oz (104.2 kg)  01/17/20 225 lb 8 oz (102.3 kg)  11/22/19 220 lb 14.4 oz (100.2 kg)   Body mass index is 30.32 kg/m.    GENERAL:alert, in no acute distress and comfortable SKIN: no acute rashes, no significant lesions EYES: conjunctiva are pink and non-injected, sclera anicteric OROPHARYNX: MMM, no exudates, no oropharyngeal erythema or ulceration NECK: supple, no JVD LYMPH:  no palpable lymphadenopathy in the cervical, axillary or inguinal regions LUNGS: clear to auscultation b/l with normal respiratory effort HEART: regular rate & rhythm ABDOMEN:  normoactive bowel sounds , non tender, not distended. No palpable hepatosplenomegaly.  Extremity: no pedal edema PSYCH: alert & oriented x 3 with fluent speech NEURO: no focal motor/sensory deficits   LABORATORY DATA:   CBC Latest Ref Rng & Units 02/16/2020 01/17/2020 12/16/2019  WBC 4.0 - 10.5 K/uL 3.5(L) 3.8(L) 2.6(L)  Hemoglobin 13.0 - 17.0 g/dL 14.4 14.3 13.3  Hematocrit 39.0 - 52.0 % 41.3 41.1 38.8(L)  Platelets 150 - 400 K/uL 107(L) 129(L) 124(L)     CMP Latest Ref Rng & Units 02/16/2020 01/17/2020 12/16/2019  Glucose 70 - 99 mg/dL 86 92 98  BUN 8 - 23 mg/dL '17 17 16  ' Creatinine 0.61 - 1.24 mg/dL 1.04 1.05 0.93  Sodium 135 - 145 mmol/L 143 141 142  Potassium 3.5 - 5.1 mmol/L 4.1 4.5 4.2  Chloride 98 - 111 mmol/L 108 109 109  CO2 22 - 32 mmol/L '27 25 26  ' Calcium 8.9 - 10.3 mg/dL 8.9 8.7(L) 8.5(L)  Total Protein 6.5 - 8.1 g/dL 6.2(L) 6.1(L) 5.9(L)  Total Bilirubin 0.3 - 1.2 mg/dL 0.5 0.5 0.6  Alkaline Phos 38 - 126 U/L 118 109 98  AST 15 - 41 U/L '23 29 30  ' ALT 0 - 44 U/L 25 35 34   . Lab Results  Component Value Date   LDH 270 (H) 10/21/2019    Fine Needle Aspiration, Lymph NodeResulted: 12/04/2018 1:14 PM [facility] Result Narrative  ACCESSION NUMBER: [id number] RECEIVED: 11/27/2018 ORDERING PHYSICIAN: RAKHEE Megan Mans , MD PATIENT NAME: Ethan Stewart CYTOLOGY REPORT * Amended  * Final Cytologic Interpretation AMENDED REPORT  AMENDED 12/04/2018 TO ADD DIAGNOSIS   A. Right inguinal lymph node, Fine Needle Aspiration II (smears and cell block): B-cell lymphoma with germinal center phenotype (see comment)  Specimen Adequacy: Satisfactory for evaluation.  B. Cytology core biopsy: B-cell lymphoma with germinal center phenotype (see comment)  Specimen Adequacy: Satisfactory for evaluation.    COMMENT:Sections of the core biopsy demonstrate an atypical vaguely nodular lymphoid infiltrate composed of medium sized lymphoid cells with mature chromatin, irregular nuclear membranes and scant cytoplasm. Focally some areas show larger cells. Few  mitotic figures are present. Appropriately controlled immunohistochemical stains are performed. Sheets of B-cells are highlighted by CD20. These B-cells co-express BCL2 and BCL6 (focal, nodular distribution). They are negative for CD30 and MYC. CD21 demonstrates few residual disrupted follicular dendritic meshworks. The proliferation index is low (10-20%), as demonstrated by Ki-67. Scattered small T-cells are highlighted by CD3. Concurrent flow cytometry identified a kappa light chain restricted B-cell population, co-expressing CD10.  Overall, the findings are consistent with involvement by a B-cell lymphoma with a germinal center phenotype (GC-type), with focal follicular architecture and a low proliferation index. Given the nature of the sample (i.e. core biopsy) a final architectural subclassification is not possible. If clinically feasible, an excisional biopsy is recommended.  Correlation with clinical and cytogenetic/molecular data is recommended.    12/11/18 Inguinal LN Biopsy  Interpretation Tissue-Flow Cytometry - MONOCLONAL B-CELL POPULATION EXPRESSING CD10 COMPRISES 92% OF ALL LYMPHOCYTES - SEE COMMENT Microscopic Gated population: Flow cytometric immunophenotyping is performed  using antibiodies to the antigens listed in the table below. Electronic gates are placed around a cell cluster displaying light scatter properties corresponding to lymphocytes. - Abnormal Cells in gated population: 92 % - Phenotype of Abnormal Cells: CD10, CD19, CD20, CD21, CD22, HLA-Dr, Kappa Diagnosis Lymph node for lymphoma, Deep Right Inguinal - FOLLICULAR LYMPHOMA, GRADE 1-2 - SEE COMMENT Microscopic Comment The nodal architecture is effaced by a proliferation back to back follicles composed of medium to large lymphocytes with irregular, cleaved nuclei and scattered centroblasts. There are no diffuse areas of growth present within the examined nodal material. Immunohistochemistry performed on the tissue highlights a nodular B-cell proliferation with expression of CD20, CD10, bcl-6, and Bcl-2. CD21 highlights expanded follicular dendritic meshworks. The Ki-67 shows a slightly increased proliferation rate of 30-40%. The atypical lymphocytes are negative for CD5. CD3 immunohistochemistry highlights the background T-cells. Flow cytometry was performed. A monoclonal B-cell populaton expressing CD10 comprises 92% of all lymphocytes (See [phone]). Overall, the findings are consistent with a diagnosis of low grade follicular lymphoma, Grade 1-2. Of note, the increased ki-67 may portend a more aggressive course despite the low histologic grade.   01/18/19 BM Biopsy:   05/05/19 BM Biopsy:   10/22/202 CT CHEST, ABDOMEN, AND PELVIS WITH CONTRAST (Accession [id number])    RADIOGRAPHIC STUDIES: I have personally reviewed the radiological images as listed and agreed with the findings in the report. No results found.    ASSESSMENT & PLAN:   Ethan Stewart is a 74 y.o. male with:  1) Diffuse large B-cell lymphoma stage IV AE with involvement of mesenteric, inguinal lymph nodes and C5 vertebral body with spinal cord impingement  Patient is status post six cycles of R-CHOP and  R-mini-CHOP, completed in 2016, as noted above.  2) History of capillary leak syndrome with ARDS requiring intubation likely due to G-CSF.  3)  Follicular Lymphoma, Grade 1-2 currently on Revlimid + Rituxan  11/02/18 CT A/P which revealed Negative for inguinal or bowel hernia but positive for bulky right inguinal and external iliac lymphadenopathy which is new since a 2017 PET-CT and highly suspicious for recurrent Lymphoma. This would be amenable to Ultrasound-guided needle biopsy. 2. No abdominal or contralateral left hemi pelvic lymphadenopathy. No other acute findings identified in the abdomen or pelvis. 3. Chronic pneumobilia.  Aortic Atherosclerosis.  11/12/18 PET/CT which revealed  Interval development of multistation lymphadenopathy in the abdomen and pelvis as detailed above- Deauville 5. 2. Presence of pneumobilia could reflect sphincter of Oddi dysfunction versus recent procedure. Correlate with clinical history  and upcoming comprehensive metabolic panel. 3. Ancillary CT findings   11/27/18 US guided LN Biopsy pathology report is as noted above. " B-cell lymphoma with a germinal center phenotype (GC-type), with focal follicular architecture and a low proliferation index. Given the nature of the sample (i.e. core biopsy) a final architectural subclassification is not possible. If clinically feasible, an excisional biopsy is recommended."  12/11/18 Right Inguinal LN biopsy revealed Grade 1-2 Follicular Lymphoma  0/2/40 CT A/P revealed Today's study demonstrates clear progression of disease with increased number and size of numerous enlarged lymph nodes throughout the lower thorax, abdomen and pelvis, as detailed above. 2. In addition, there are several small pulmonary nodules in the lung bases. The largest of these are clustered in the posterior aspect of the right lower lobe in an apparent area of scarring which is very similar to prior PET-CT 03/12/2016, favored to be benign. However,  the smaller nodules which measure up to 6 mm in size are nonspecific and warrant attention on follow-up studies. 3. Aortic atherosclerosis, in addition to least 2 vessel coronary artery disease. Assessment for potential risk factor modification, dietary therapy or pharmacologic therapy may be warranted, if clinically indicated. 4. Additional incidental findings, as above. 01/18/19 BM Bx revealed normocellular bone marrow with trilineage hematopoiesis Stage II indicated with 01/26/19 CT  Began C1 of 37m Revlimid and Rituxan on 02/05/19  05/05/19 BM Bx revealed normocellular bone marrow with erythroid hyperplasia, and no evidence of lymphoma  05/11/19 PET/CT revealed "Near complete metabolic response to therapy of lymphoma since the prior PET of 11/12/2018. Low-level hypermetabolism at the site of adenopathy within the abdominal retroperitoneum persists. (Deauville) 2. There has also been resolution of abdominopelvic adenopathy since the diagnostic CT of 01/26/2019. 2. Nonspecific right lower lobe pulmonary nodules, similar. 3. Coronary artery atherosclerosis. Aortic Atherosclerosis."   10/14/2019 CT C/A/P ([id number] revealed " 1. No findings of active lymphoma. 2. Pulmonary nodules in the right lung are mostly stable from March  2017, and considered benign, although one 5 mm right lower lobe nodule medially on image 115/6 is stable of the last year but not well seen on 03/12/2016 and may merit surveillance. 3. Reduced prominence of treatment related stranding in the left periaortic region likely the residua of prior adenopathy. 4. Other imaging findings of potential clinical significance:Coronary atherosclerosis. Mild cardiomegaly. Cholelithiasis. Mild prostatomegaly. Mild lumbar spondylosis and degenerative disc disease causing mild impingement at L3-4 and L4-5. Small left renal peripelvic cysts. Prior bilateral groin hernia repairs. Pneumobilia, query prior sphincterotomy."  4) History of right lower  extremity distal DVT. Resolved. S/p 6 months of treatment in 02/2016 -back on 152mXarelto while taking Revlimid  5) Mild chronic thrombocytopenia-  128k   PLAN: -Discussed pt labwork today, 02/16/20; RBC and WBC are nml, PLT are steady, other blood counts and chemistries look good  -Discussed 02/16/2020 Vitamin D25 hydroxy is low at 27.84 -No lab or clinical evidence of the progression of pt's lymphoma this time -The pt has no prohibitive toxicities from continuing Rituxan at this time -continue Revlimid 1547m week on and 1 week off -Will consider moving to maintenance Rixutan every 8 weeks and discontinuing Revlimid after C18 -Will repeat scans after C18 -Recommend pt continue PO 50K UT Vitamin D -Will see back in 4 weeks with labs    FOLLOW UP: Please schedule next 2 monthly doses of Rituxan with labs and MD visits      All of the patient's questions were answered with apparent satisfaction. The patient  knows to call the clinic with any problems, questions or concerns.   Sullivan Lone MD [place] [facility]/Oncology Physician [facility]  (Office):       [phone] (Work cell):  [phone] (Fax):           [phone]   I, Yevette Edwards, am acting as a scribe for Dr. Sullivan Lone.   .I have reviewed the above documentation for accuracy and completeness, and I agree with the above. Brunetta Genera MD

## 2020-02-16 NOTE — Patient Instructions (Signed)

## 2020-02-16 NOTE — Patient Instructions (Signed)
[facility] Discharge Instructions for Patients Receiving Chemotherapy  Today you received the following chemotherapy agents: Rituxan   To help prevent nausea and vomiting after your treatment, we encourage you to take your nausea medication as directed.    If you develop nausea and vomiting that is not controlled by your nausea medication, call the clinic.   BELOW ARE SYMPTOMS THAT SHOULD BE REPORTED IMMEDIATELY:  *FEVER GREATER THAN 100.5 F  *CHILLS WITH OR WITHOUT FEVER  NAUSEA AND VOMITING THAT IS NOT CONTROLLED WITH YOUR NAUSEA MEDICATION  *UNUSUAL SHORTNESS OF BREATH  *UNUSUAL BRUISING OR BLEEDING  TENDERNESS IN MOUTH AND THROAT WITH OR WITHOUT PRESENCE OF ULCERS  *URINARY PROBLEMS  *BOWEL PROBLEMS  UNUSUAL RASH Items with * indicate a potential emergency and should be followed up as soon as possible.  Feel free to call the clinic you have any questions or concerns. The clinic phone number is [phone].  Please show the CHEMO ALERT CARD at check-in to the Emergency Department and triage nurse.   

## 2020-02-17 ENCOUNTER — Telehealth: Payer: Self-pay | Admitting: Hematology

## 2020-02-17 NOTE — Telephone Encounter (Signed)
Rescheduled per 2/25 sch msg, pt req. Called and spoke with pt, confirmed 4/21 appt

## 2020-02-18 ENCOUNTER — Telehealth: Payer: Self-pay | Admitting: Hematology

## 2020-02-18 NOTE — Telephone Encounter (Signed)
Scheduled per 02/24 los, patient has been called and notified.  ?

## 2020-03-09 ENCOUNTER — Other Ambulatory Visit: Payer: Self-pay | Admitting: *Deleted

## 2020-03-09 DIAGNOSIS — C8298 Follicular lymphoma, unspecified, lymph nodes of multiple sites: Secondary | ICD-10-CM

## 2020-03-09 MED ORDER — LENALIDOMIDE 15 MG PO CAPS: 15.0000 mg | ORAL_CAPSULE | Freq: Every day | ORAL | 0 refills | Status: DC

## 2020-03-09 NOTE — Telephone Encounter (Signed)
Patient requested refill of Revlimid RX filled per Dr. Irene Limbo OV note 02/16/20 RX faxed to [facility] at [phone], with [place] [phone] on fax cover, including [facility] Prescription Form for Revlimid and patient med list. [employer] Auth# [id number], 03/09/2020 Fax confirmation received

## 2020-03-13 DIAGNOSIS — E669 Obesity, unspecified: Secondary | ICD-10-CM | POA: Diagnosis not present

## 2020-03-13 DIAGNOSIS — F419 Anxiety disorder, unspecified: Secondary | ICD-10-CM | POA: Diagnosis not present

## 2020-03-13 DIAGNOSIS — N400 Enlarged prostate without lower urinary tract symptoms: Secondary | ICD-10-CM | POA: Diagnosis not present

## 2020-03-13 DIAGNOSIS — R69 Illness, unspecified: Secondary | ICD-10-CM | POA: Diagnosis not present

## 2020-03-13 DIAGNOSIS — C8590 Non-Hodgkin lymphoma, unspecified, unspecified site: Secondary | ICD-10-CM | POA: Diagnosis not present

## 2020-03-13 DIAGNOSIS — K5900 Constipation, unspecified: Secondary | ICD-10-CM | POA: Diagnosis not present

## 2020-03-13 DIAGNOSIS — E785 Hyperlipidemia, unspecified: Secondary | ICD-10-CM | POA: Diagnosis not present

## 2020-03-13 DIAGNOSIS — R030 Elevated blood-pressure reading, without diagnosis of hypertension: Secondary | ICD-10-CM | POA: Diagnosis not present

## 2020-03-13 DIAGNOSIS — E039 Hypothyroidism, unspecified: Secondary | ICD-10-CM | POA: Diagnosis not present

## 2020-03-13 DIAGNOSIS — G4700 Insomnia, unspecified: Secondary | ICD-10-CM | POA: Diagnosis not present

## 2020-03-13 NOTE — Progress Notes (Signed)
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 4) left neck basal cell carcinoma- removed recently  Current treatment - Revlimid + Rituxan for follicular lymphoma  PreviousTreatment:  -Status post 6 cycles of R CHOP (dose reductions for cycle 2-3)  -Cannot use G-CSF due to issues with severe G-CSF related ARDS after cycle 1 (requiring prolonged intubation and ventilatory support)   ONCOLOGIC HISTORY  Diffuse large B-cell lymphoma stage IV AE with involvement of mesenteric, inguinal lymph nodes and C5 vertebral body with spinal cord impingement   He had a C5 corpectomy on 07/26/2015 that showed diffuse large B-cell lymphoma with a Ki-67 ranging from 20 to 90%. Cytogenetics and Fish showed BCL 2 positivity but negative for BCL 6 and cMYC CD10 positivity suggestive of possibly GCB subtype.  Patient received first cycle of R CHOP on 08/08/2015 and intrathecal methotrexate with hydrocortisone on 08/09/2015. Patient subsequently was admitted with ARDS likely due to neulasta -treated with high dose steroids and empirically with broad spectrum antibiotics.  2nd cycle of chemotherapy delayed to allow for rehabilitation (was due on 08/31/2015).  Received R-mini-CHOP for cycle 2 and tolerated it without significant cytopenias. Was admitted briefly for a mild lower extremity cellulitis and DVT. Cellulitis now resolved. Patient completing his oral antibiotics.  PET/CT scan on 09/29/2015 with no evidence of disease progression. Good response in his mesenteric lymph nodes. Inguinal lymph  nodes on the right side. Decreased uptake in his C5 level.  No new lesions noted.   Cycle 3 (10/02/2015)  of R- CHOP ( we increased the doxorubicin dose back to 50 mg/m and keep the cyclophosphamide dose reduction at 400 mg/m]  Cycle 4 (10/23/2015) of R-CHOP ( we increased the doxorubicin dose back to 50 mg/m and kept the cyclophosphamide dose reduction at 400 mg/m].  PET/CT 11/10/2015: Shows improvement in most lesions but is noted to have a new FDG avid lymph node in the mesentery of the small bowel.  Cycle 5 on 11/13/2015 R CHOP - received full dose.  Cycle 6 on 12/11/2015 R-CHOP full dose.  Admitted with neutropenic fevers and treatment for possible early pneumonia.  This resolved and he was able to discharge home  01/09/2016 through 01/22/2016: The patient was treated to the C4-C6 levels of the spine to a dose of 30 gray in 10 fractions using a 2 field technique. The patient was also treated to an abdominal lymph node region to a dose of 30 gray in 10 fractions using a 3 field technique. This treatment consisted of a 3-D conformal technique with daily image guidance utilized.   Repeat PET CT scan in March 12 2016 -showed some enlarged mesenteric lymph nodes which were FDG avid.  CT guided FNA of mesenteric lymph nodes at [facility] on 03/29/2016 - showed scant cellularity. No overt evidence of lymphoma.  Rpt PET/CT 05/15/2016 at [facility]- show similar appearing enlarged mesenteric lymph nodes which are FDG avid. No change in size and no other evidence of disease progression.  PET/CT 07/24/2016 - and [facility] shows no evidence of disease progression  PET/CT 01/22/2017:  Result Impression   1.Stable low-level metabolic activity in small lymph nodes in the small bowel mesentery. No new FDG avid disease. 2.Persistent hypermetabolic soft tissue thickening overlying the coccyx which could relate to a decubitus ulcer and should be amenable to  direct inspection. 3.Stable pulmonary nodules which do not appear hypermetabolic, however, several are below the size resolution of PET in size. Continued attention on follow-up is suggested. 4.Previously seen hypermetabolic skin thickening near the right ear has resolved. 5.Ancillary CT findings as above.    INTERVAL HISTORY:   Ethan Stewart is here for management and evaluation of [name], grade 1-2. Pt is here for C17 of Rituxan. The patient's last visit with [place] was on 02/16/2020. The pt reports that he is doing well overall.  The pt reports that he has had both doses of the COVID19 vaccine and tolerated them well. Pt is just finishing his week off and will begin taking Revlimid tomorrow. He has felt well in the interim and denies any new concerns.  Lab results today (03/15/20) of CBC w/diff and CMP is as follows: all values are WNL except for WBC at 3.1K, PLT at 138K, Neutro Abs at 1.2K, Total Protein at 6.1.  On review of systems, pt denies any other symptoms.    REVIEW OF SYSTEMS:   A 10+ POINT REVIEW OF SYSTEMS WAS OBTAINED including neurology, dermatology, psychiatry, cardiac, respiratory, lymph, extremities, GI, GU, Musculoskeletal, constitutional, breasts, reproductive, HEENT.  All pertinent positives are noted in the HPI.  All others are negative.    ALLERGIES:  is allergic to neulasta [pegfilgrastim].  MEDICATIONS:  Current Outpatient Medications  Medication Sig Dispense Refill   diazepam (VALIUM) 2 MG tablet Take 2 mg by mouth daily as needed for anxiety.     ergocalciferol (VITAMIN D2) 1.25 MG (50000 UT) capsule Take 1 capsule (50,000 Units total) by mouth once a week. 12 capsule 4   lenalidomide (REVLIMID) 15 MG capsule Take 1 capsule (15 mg total) by mouth daily. 3 weeks on 1 week off. 21 capsule 0   levothyroxine (SYNTHROID, LEVOTHROID) 88 MCG tablet Take 88 mcg by mouth daily before breakfast.   2   lidocaine-prilocaine (EMLA)  cream Apply 1 application topically as needed (for port access).     Multiple Vitamin (MULTIVITAMIN WITH MINERALS) TABS tablet Take 1 tablet by mouth daily.      polyethylene glycol (MIRALAX / GLYCOLAX) 17 g packet Take 17 g by mouth daily. Takes with revlimid     prochlorperazine (COMPAZINE) 10 MG tablet Take 1 tablet (10 mg total) by mouth every 8 (eight) hours as needed for nausea or vomiting. 30 tablet 0   rivaroxaban (XARELTO) 10 MG TABS tablet Take 1 tablet (10 mg total) by mouth daily. 30 tablet 4   senna (SENOKOT) 8.6 MG TABS tablet Take 2 tablets by mouth at bedtime.     simvastatin (ZOCOR) 20 MG tablet Take 20 mg by mouth daily.     tamsulosin (FLOMAX) 0.4 MG CAPS capsule Take 1 capsule (0.4 mg total) by mouth at bedtime. 30 capsule 3   temazepam (RESTORIL) 15 MG capsule Take 15 mg by mouth at bedtime as needed for sleep.      HYDROcodone-acetaminophen (NORCO) 5-325 MG tablet Take 1-2 tablets by mouth every 6 (six) hours as needed for moderate pain or severe pain. (Patient not taking: Reported on 01/17/2020) 20 tablet 0   ondansetron (ZOFRAN) 8 MG tablet Take 1 tablet (8 mg total) by mouth every  8 (eight) hours as needed for nausea or vomiting. (Patient not taking: Reported on 01/17/2020) 30 tablet 1   No current facility-administered medications for this visit.    PHYSICAL EXAMINATION: ECOG FS:1 - Symptomatic but completely ambulatory  Vitals:   03/15/20 0907  BP: 136/73  Pulse: (!) 51  Resp: 18  Temp: 97.8 F (36.6 C)  SpO2: 100%   Wt Readings from Last 3 Encounters:  03/15/20 227 lb 1.6 oz (103 kg)  02/16/20 229 lb 12.8 oz (104.2 kg)  01/17/20 225 lb 8 oz (102.3 kg)   Body mass index is 29.96 kg/m.    GENERAL:alert, in no acute distress and comfortable SKIN: no acute rashes, no significant lesions EYES: conjunctiva are pink and non-injected, sclera anicteric OROPHARYNX: MMM, no exudates, no oropharyngeal erythema or ulceration NECK: supple, no JVD LYMPH:   no palpable lymphadenopathy in the cervical, axillary or inguinal regions LUNGS: clear to auscultation b/l with normal respiratory effort HEART: regular rate & rhythm ABDOMEN:  normoactive bowel sounds , non tender, not distended. No palpable hepatosplenomegaly.  Extremity: no pedal edema PSYCH: alert & oriented x 3 with fluent speech NEURO: no focal motor/sensory deficits   LABORATORY DATA:   CBC Latest Ref Rng & Units 03/15/2020 02/16/2020 01/17/2020  WBC 4.0 - 10.5 K/uL 3.1(L) 3.5(L) 3.8(L)  Hemoglobin 13.0 - 17.0 g/dL 14.1 14.4 14.3  Hematocrit 39.0 - 52.0 % 42.0 41.3 41.1  Platelets 150 - 400 K/uL 138(L) 107(L) 129(L)     CMP Latest Ref Rng & Units 03/15/2020 02/16/2020 01/17/2020  Glucose 70 - 99 mg/dL 80 86 92  BUN 8 - 23 mg/dL _0 Creatinine 0.61 - 1.24 mg/dL 0.99 1.04 1.05  Sodium 135 - 145 mmol/L 142 143 141  Potassium 3.5 - 5.1 mmol/L 4.4 4.1 4.5  Chloride 98 - 111 mmol/L 110 108 109  CO2 22 - 32 mmol/L _1 Calcium 8.9 - 10.3 mg/dL 8.9 8.9 8.7(L)  Total Protein 6.5 - 8.1 g/dL 6.1(L) 6.2(L) 6.1(L)  Total Bilirubin 0.3 - 1.2 mg/dL 0.7 0.5 0.5  Alkaline Phos 38 - 126 U/L 106 118 109  AST 15 - 41 U/L _2 ALT 0 - 44 U/L 35 25 35   . Lab Results  Component Value Date   LDH 270 (H) 10/21/2019    Fine Needle Aspiration, Lymph NodeResulted: 12/04/2018 1:14 PM [facility] Result Narrative  ACCESSION NUMBER: [id number] RECEIVED: 11/27/2018 ORDERING PHYSICIAN: RAKHEE Megan Mans , MD PATIENT NAME: Docia Barrier LYN CYTOLOGY REPORT * Amended * Final Cytologic Interpretation AMENDED REPORT  AMENDED 12/04/2018 TO ADD DIAGNOSIS   A. Right inguinal lymph node, Fine Needle Aspiration II (smears and cell block): B-cell lymphoma with germinal center phenotype (see comment)  Specimen Adequacy: Satisfactory for evaluation.  B. Cytology core biopsy: B-cell lymphoma with germinal center phenotype (see  comment)  Specimen Adequacy: Satisfactory for evaluation.    COMMENT:Sections of the core biopsy demonstrate an atypical vaguely nodular lymphoid infiltrate composed of medium sized lymphoid cells with mature chromatin, irregular nuclear membranes and scant cytoplasm. Focally some areas show larger cells. Few mitotic figures are present. Appropriately controlled immunohistochemical stains are performed. Sheets of B-cells are highlighted by CD20. These B-cells co-express BCL2 and BCL6 (focal, nodular distribution). They are negative for CD30 and MYC. CD21 demonstrates few residual disrupted follicular dendritic meshworks. The proliferation index is low (10-20%), as demonstrated by Ki-67. Scattered small T-cells are highlighted by CD3. Concurrent flow cytometry identified  a kappa light chain restricted B-cell population, co-expressing CD10.  Overall, the findings are consistent with involvement by a B-cell lymphoma with a germinal center phenotype (GC-type), with focal follicular architecture and a low proliferation index. Given the nature of the sample (i.e. core biopsy) a final architectural subclassification is not possible. If clinically feasible, an excisional biopsy is recommended.  Correlation with clinical and cytogenetic/molecular data is recommended.    12/11/18 Inguinal LN Biopsy  Interpretation Tissue-Flow Cytometry - MONOCLONAL B-CELL POPULATION EXPRESSING CD10 COMPRISES 92% OF ALL LYMPHOCYTES - SEE COMMENT Microscopic Gated population: Flow cytometric immunophenotyping is performed using antibiodies to the antigens listed in the table below. Electronic gates are placed around a cell cluster displaying light scatter properties corresponding to lymphocytes. - Abnormal Cells in gated population: 92 % - Phenotype of Abnormal Cells: CD10, CD19, CD20, CD21, CD22, HLA-Dr, Kappa Diagnosis Lymph node for lymphoma, Deep Right Inguinal - FOLLICULAR LYMPHOMA, GRADE  1-2 - SEE COMMENT Microscopic Comment The nodal architecture is effaced by a proliferation back to back follicles composed of medium to large lymphocytes with irregular, cleaved nuclei and scattered centroblasts. There are no diffuse areas of growth present within the examined nodal material. Immunohistochemistry performed on the tissue highlights a nodular B-cell proliferation with expression of CD20, CD10, bcl-6, and Bcl-2. CD21 highlights expanded follicular dendritic meshworks. The Ki-67 shows a slightly increased proliferation rate of 30-40%. The atypical lymphocytes are negative for CD5. CD3 immunohistochemistry highlights the background T-cells. Flow cytometry was performed. A monoclonal B-cell populaton expressing CD10 comprises 92% of all lymphocytes (See [id number]). Overall, the findings are consistent with a diagnosis of low grade follicular lymphoma, Grade 1-2. Of note, the increased ki-67 may portend a more aggressive course despite the low histologic grade.   01/18/19 BM Biopsy:   05/05/19 BM Biopsy:   10/22/202 CT CHEST, ABDOMEN, AND PELVIS WITH CONTRAST (Accession [id number])    RADIOGRAPHIC STUDIES: I have personally reviewed the radiological images as listed and agreed with the findings in the report. No results found.    ASSESSMENT & PLAN:   TRASE BUNDA is a 74 y.o. male with:  1) Diffuse large B-cell lymphoma stage IV AE with involvement of mesenteric, inguinal lymph nodes and C5 vertebral body with spinal cord impingement  Patient is status post six cycles of R-CHOP and R-mini-CHOP, completed in 2016, as noted above.  2) History of capillary leak syndrome with ARDS requiring intubation likely due to G-CSF.  3)  Follicular Lymphoma, Grade 1-2 currently on Revlimid + Rituxan  11/02/18 CT A/P which revealed Negative for inguinal or bowel hernia but positive for bulky right inguinal and external iliac lymphadenopathy which is new since a 2017 PET-CT and  highly suspicious for recurrent Lymphoma. This would be amenable to Ultrasound-guided needle biopsy. 2. No abdominal or contralateral left hemi pelvic lymphadenopathy. No other acute findings identified in the abdomen or pelvis. 3. Chronic pneumobilia.  Aortic Atherosclerosis.  11/12/18 PET/CT which revealed  Interval development of multistation lymphadenopathy in the abdomen and pelvis as detailed above- Deauville 5. 2. Presence of pneumobilia could reflect sphincter of Oddi dysfunction versus recent procedure. Correlate with clinical history and upcoming comprehensive metabolic panel. 3. Ancillary CT findings   11/27/18 US guided LN Biopsy pathology report is as noted above. " B-cell lymphoma with a germinal center phenotype (GC-type), with focal follicular architecture and a low proliferation index. Given the nature of the sample (i.e. core biopsy) a final architectural subclassification is not possible. If clinically feasible, an excisional biopsy  is recommended."  12/11/18 Right Inguinal LN biopsy revealed Grade 1-2 Follicular Lymphoma  01/27/94 CT A/P revealed Today's study demonstrates clear progression of disease with increased number and size of numerous enlarged lymph nodes throughout the lower thorax, abdomen and pelvis, as detailed above. 2. In addition, there are several small pulmonary nodules in the lung bases. The largest of these are clustered in the posterior aspect of the right lower lobe in an apparent area of scarring which is very similar to prior PET-CT 03/12/2016, favored to be benign. However, the smaller nodules which measure up to 6 mm in size are nonspecific and warrant attention on follow-up studies. 3. Aortic atherosclerosis, in addition to least 2 vessel coronary artery disease. Assessment for potential risk factor modification, dietary therapy or pharmacologic therapy may be warranted, if clinically indicated. 4. Additional incidental findings, as above. 01/18/19 BM Bx  revealed normocellular bone marrow with trilineage hematopoiesis Stage II indicated with 01/26/19 CT  Began C1 of '20mg'$  Revlimid and Rituxan on 02/05/19  05/05/19 BM Bx revealed normocellular bone marrow with erythroid hyperplasia, and no evidence of lymphoma  05/11/19 PET/CT revealed "Near complete metabolic response to therapy of lymphoma since the prior PET of 11/12/2018. Low-level hypermetabolism at the site of adenopathy within the abdominal retroperitoneum persists. (Deauville) 2. There has also been resolution of abdominopelvic adenopathy since the diagnostic CT of 01/26/2019. 2. Nonspecific right lower lobe pulmonary nodules, similar. 3. Coronary artery atherosclerosis. Aortic Atherosclerosis."   10/14/2019 CT C/A/P ([id number]) revealed " 1. No findings of active lymphoma. 2. Pulmonary nodules in the right lung are mostly stable from March  2017, and considered benign, although one 5 mm right lower lobe nodule medially on image 115/6 is stable of the last year but not well seen on 03/12/2016 and may merit surveillance. 3. Reduced prominence of treatment related stranding in the left periaortic region likely the residua of prior adenopathy. 4. Other imaging findings of potential clinical significance:Coronary atherosclerosis. Mild cardiomegaly. Cholelithiasis. Mild prostatomegaly. Mild lumbar spondylosis and degenerative disc disease causing mild impingement at L3-4 and L4-5. Small left renal peripelvic cysts. Prior bilateral groin hernia repairs. Pneumobilia, query prior sphincterotomy."  4) History of right lower extremity distal DVT. Resolved. S/p 6 months of treatment in 02/2016 -back on '10mg'$  Xarelto while taking Revlimid  5) Mild chronic thrombocytopenia-  128k   PLAN: -Discussed pt labwork today, 03/15/20; mild Neutropenia, other blood counts and chemistries look good - Neutropenia is likely from Revlimid -No lab or clinical evidence of the progression of pt's lymphoma this time. -The pt  has no prohibitive toxicities from continuing Rituxan at this time. -Continue Revlimid '15mg'$  3 week on and 1 week off. No prohibitive toxicities.  -Plan to discontinue Revlimid after C18 -Will get repeat PET/CT after C18 -Discussed continuing maintenance Rituxan every two months after C18 vs discontinuing all treatment and watching with labs and scans - will make a decision after the result of the repeat PET/CT -Advised pt that maintenance Rituxan can significantly increase time to progression  -Advised pt that if lymphoma progresses after the cessation of treatment we would use the same criteria to decide when to retreat. -Discussed treatment options for relapsed Diffuse Large B-cell Lymphoma - no indication of such concerns at this time.  -Recommended that the pt continue to eat well, drink at least 48-64 oz of water each day, and walk 20-30 minutes each day.  -Recommend pt stay up to date with Colonoscopies and other age-appropriate cancer screenings.  -Continue 50000 UT Ergocalciferol  once per week.  -Will see back in 4 weeks with labs    FOLLOW UP: Please schedule next dose of Rituxan with labs and MD visits       The total time spent in the appt was 30 minutes and more than 50% was on counseling and direct patient cares.  All of the patient's questions were answered with apparent satisfaction. The patient knows to call the clinic with any problems, questions or concerns.   Sullivan Lone MD [address] Hematology/Oncology Physician [facility]  (Office):       [phone] (Work cell):  [phone] (Fax):           [phone]   I, Yevette Edwards, am acting as a scribe for Dr. Sullivan Lone.   .I have reviewed the above documentation for accuracy and completeness, and I agree with the above. Brunetta Genera MD

## 2020-03-15 ENCOUNTER — Inpatient Hospital Stay: Payer: No Typology Code available for payment source | Attending: Hematology

## 2020-03-15 ENCOUNTER — Inpatient Hospital Stay (HOSPITAL_BASED_OUTPATIENT_CLINIC_OR_DEPARTMENT_OTHER): Payer: No Typology Code available for payment source | Admitting: Hematology

## 2020-03-15 ENCOUNTER — Other Ambulatory Visit: Payer: Self-pay

## 2020-03-15 ENCOUNTER — Inpatient Hospital Stay: Payer: No Typology Code available for payment source

## 2020-03-15 VITALS — BP 126/65 | HR 48 | Temp 97.9°F | Resp 17

## 2020-03-15 VITALS — BP 136/73 | HR 51 | Temp 97.8°F | Resp 18 | Ht 73.0 in | Wt 227.1 lb

## 2020-03-15 DIAGNOSIS — Z5112 Encounter for antineoplastic immunotherapy: Secondary | ICD-10-CM

## 2020-03-15 DIAGNOSIS — C8298 Follicular lymphoma, unspecified, lymph nodes of multiple sites: Secondary | ICD-10-CM

## 2020-03-15 DIAGNOSIS — D702 Other drug-induced agranulocytosis: Secondary | ICD-10-CM

## 2020-03-15 DIAGNOSIS — C8330 Diffuse large B-cell lymphoma, unspecified site: Secondary | ICD-10-CM

## 2020-03-15 LAB — CBC WITH DIFFERENTIAL/PLATELET
Abs Immature Granulocytes: 0 10*3/uL (ref 0.00–0.07)
Basophils Absolute: 0.1 10*3/uL (ref 0.0–0.1)
Basophils Relative: 3 %
Eosinophils Absolute: 0.1 10*3/uL (ref 0.0–0.5)
Eosinophils Relative: 3 %
HCT: 42 % (ref 39.0–52.0)
Hemoglobin: 14.1 g/dL (ref 13.0–17.0)
Immature Granulocytes: 0 %
Lymphocytes Relative: 34 %
Lymphs Abs: 1.1 10*3/uL (ref 0.7–4.0)
MCH: 29.7 pg (ref 26.0–34.0)
MCHC: 33.6 g/dL (ref 30.0–36.0)
MCV: 88.4 fL (ref 80.0–100.0)
Monocytes Absolute: 0.6 10*3/uL (ref 0.1–1.0)
Monocytes Relative: 20 %
Neutro Abs: 1.2 10*3/uL — ABNORMAL LOW (ref 1.7–7.7)
Neutrophils Relative %: 40 %
Platelets: 138 10*3/uL — ABNORMAL LOW (ref 150–400)
RBC: 4.75 MIL/uL (ref 4.22–5.81)
RDW: 13.5 % (ref 11.5–15.5)
WBC: 3.1 10*3/uL — ABNORMAL LOW (ref 4.0–10.5)
nRBC: 0 % (ref 0.0–0.2)

## 2020-03-15 LAB — CMP (CANCER CENTER ONLY)
ALT: 35 U/L (ref 0–44)
AST: 31 U/L (ref 15–41)
Albumin: 3.9 g/dL (ref 3.5–5.0)
Alkaline Phosphatase: 106 U/L (ref 38–126)
Anion gap: 5 (ref 5–15)
BUN: 13 mg/dL (ref 8–23)
CO2: 27 mmol/L (ref 22–32)
Calcium: 8.9 mg/dL (ref 8.9–10.3)
Chloride: 110 mmol/L (ref 98–111)
Creatinine: 0.99 mg/dL (ref 0.61–1.24)
GFR, Est AFR Am: >60 mL/min (ref >60)
GFR, Estimated: >60 mL/min (ref >60)
Glucose, Bld: 80 mg/dL (ref 70–99)
Potassium: 4.4 mmol/L (ref 3.5–5.1)
Sodium: 142 mmol/L (ref 135–145)
Total Bilirubin: 0.7 mg/dL (ref 0.3–1.2)
Total Protein: 6.1 g/dL — ABNORMAL LOW (ref 6.5–8.1)

## 2020-03-15 MED ORDER — SODIUM CHLORIDE 0.9% FLUSH
10.0000 mL | INTRAVENOUS | Status: DC | PRN
  Administered 2020-03-15: 10 mL
  Filled 2020-03-15: qty 10

## 2020-03-15 MED ORDER — ACETAMINOPHEN 325 MG PO TABS
650.0000 mg | ORAL_TABLET | Freq: Once | ORAL | Status: AC
  Administered 2020-03-15: 11:00:00 650 mg via ORAL

## 2020-03-15 MED ORDER — DIPHENHYDRAMINE HCL 25 MG PO CAPS
50.0000 mg | ORAL_CAPSULE | Freq: Once | ORAL | Status: AC
  Administered 2020-03-15: 50 mg via ORAL

## 2020-03-15 MED ORDER — SODIUM CHLORIDE 0.9 % IV SOLN
375.0000 mg/m2 | Freq: Once | INTRAVENOUS | Status: AC
  Administered 2020-03-15: 900 mg via INTRAVENOUS
  Filled 2020-03-15: qty 40

## 2020-03-15 MED ORDER — DIPHENHYDRAMINE HCL 25 MG PO CAPS
ORAL_CAPSULE | ORAL | Status: AC
  Filled 2020-03-15: qty 2

## 2020-03-15 MED ORDER — HEPARIN SOD (PORK) LOCK FLUSH 100 UNIT/ML IV SOLN
500.0000 [IU] | Freq: Once | INTRAVENOUS | Status: AC | PRN
  Administered 2020-03-15: 14:00:00 500 [IU]
  Filled 2020-03-15: qty 5

## 2020-03-15 MED ORDER — METHYLPREDNISOLONE SODIUM SUCC 125 MG IJ SOLR
INTRAMUSCULAR | Status: AC
  Filled 2020-03-15: qty 2

## 2020-03-15 MED ORDER — ACETAMINOPHEN 325 MG PO TABS
ORAL_TABLET | ORAL | Status: AC
  Filled 2020-03-15: qty 2

## 2020-03-15 MED ORDER — METHYLPREDNISOLONE SODIUM SUCC 125 MG IJ SOLR
125.0000 mg | Freq: Once | INTRAMUSCULAR | Status: AC
  Administered 2020-03-15: 125 mg via INTRAVENOUS

## 2020-03-15 MED ORDER — SODIUM CHLORIDE 0.9 % IV SOLN
Freq: Once | INTRAVENOUS | Status: AC
  Filled 2020-03-15: qty 250

## 2020-03-15 NOTE — Patient Instructions (Signed)
Cancer Center °Discharge Instructions for Patients Receiving Chemotherapy ° °Today you received the following chemotherapy agents: Rituximab ° °To help prevent nausea and vomiting after your treatment, we encourage you to take your nausea medication as directed.  °  °If you develop nausea and vomiting that is not controlled by your nausea medication, call the clinic.  ° °BELOW ARE SYMPTOMS THAT SHOULD BE REPORTED IMMEDIATELY: °· *FEVER GREATER THAN 100.5 F °· *CHILLS WITH OR WITHOUT FEVER °· NAUSEA AND VOMITING THAT IS NOT CONTROLLED WITH YOUR NAUSEA MEDICATION °· *UNUSUAL SHORTNESS OF BREATH °· *UNUSUAL BRUISING OR BLEEDING °· TENDERNESS IN MOUTH AND THROAT WITH OR WITHOUT PRESENCE OF ULCERS °· *URINARY PROBLEMS °· *BOWEL PROBLEMS °· UNUSUAL RASH °Items with * indicate a potential emergency and should be followed up as soon as possible. ° °Feel free to call the clinic should you have any questions or concerns. The clinic phone number is [phone]. ° °Please show the CHEMO ALERT CARD at check-in to the Emergency Department and triage nurse. ° ° °

## 2020-03-16 ENCOUNTER — Telehealth: Payer: Self-pay | Admitting: Hematology

## 2020-03-16 NOTE — Telephone Encounter (Signed)
Scheduled per 03/24 los, patient has been called and voicemail was left.

## 2020-03-29 DIAGNOSIS — R69 Illness, unspecified: Secondary | ICD-10-CM | POA: Diagnosis not present

## 2020-03-29 DIAGNOSIS — G479 Sleep disorder, unspecified: Secondary | ICD-10-CM | POA: Diagnosis not present

## 2020-04-03 ENCOUNTER — Other Ambulatory Visit: Payer: Self-pay | Admitting: Hematology

## 2020-04-04 ENCOUNTER — Other Ambulatory Visit: Payer: Self-pay | Admitting: *Deleted

## 2020-04-04 DIAGNOSIS — C8298 Follicular lymphoma, unspecified, lymph nodes of multiple sites: Secondary | ICD-10-CM

## 2020-04-04 MED ORDER — LENALIDOMIDE 15 MG PO CAPS: 15.0000 mg | ORAL_CAPSULE | Freq: Every day | ORAL | 0 refills | Status: DC

## 2020-04-04 NOTE — Telephone Encounter (Signed)
Patient called - requested refill of Revlimid if Dr. Irene Limbo says he still needes it. Per Dr. Irene Limbo, one more refill of Revlimid. Revlimid faxed  [phone] to [facility] [place] with MD office cover sheet stating referral #: [facility] [id number] (new# issued 12/16/2019), with Medication list attached  -- [employer] Auth# [id number], 04/04/20 Fax confirmation received

## 2020-04-11 ENCOUNTER — Ambulatory Visit: Payer: No Typology Code available for payment source | Admitting: Hematology

## 2020-04-11 ENCOUNTER — Other Ambulatory Visit: Payer: No Typology Code available for payment source

## 2020-04-11 ENCOUNTER — Ambulatory Visit: Payer: No Typology Code available for payment source

## 2020-04-11 NOTE — Progress Notes (Signed)
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 4) left neck basal cell carcinoma- removed recently  Current treatment - Revlimid + Rituxan for follicular lymphoma  PreviousTreatment:  -Status post 6 cycles of R CHOP (dose reductions for cycle 2-3)  -Cannot use G-CSF due to issues with severe G-CSF related ARDS after cycle 1 (requiring prolonged intubation and ventilatory support)   ONCOLOGIC HISTORY  Diffuse large B-cell lymphoma stage IV AE with involvement of mesenteric, inguinal lymph nodes and C5 vertebral body with spinal cord impingement   He had a C5 corpectomy on 07/26/2015 that showed diffuse large B-cell lymphoma with a Ki-67 ranging from 20 to 90%. Cytogenetics and Fish showed BCL 2 positivity but negative for BCL 6 and cMYC CD10 positivity suggestive of possibly GCB subtype.  Patient received first cycle of R CHOP on 08/08/2015 and intrathecal methotrexate with hydrocortisone on 08/09/2015. Patient subsequently was admitted with ARDS likely due to neulasta -treated with high dose steroids and empirically with broad spectrum antibiotics.  2nd cycle of chemotherapy delayed to allow for rehabilitation (was due on 08/31/2015).  Received R-mini-CHOP for cycle 2 and tolerated it without significant cytopenias. Was admitted briefly for a mild lower extremity cellulitis and DVT. Cellulitis now resolved. Patient completing his oral antibiotics.  PET/CT scan on 09/29/2015 with no evidence of disease progression. Good response in his mesenteric lymph nodes. Inguinal lymph  nodes on the right side. Decreased uptake in his C5 level.  No new lesions noted.   Cycle 3 (10/02/2015)  of R- CHOP ( we increased the doxorubicin dose back to 50 mg/m and keep the cyclophosphamide dose reduction at 400 mg/m]  Cycle 4 (10/23/2015) of R-CHOP ( we increased the doxorubicin dose back to 50 mg/m and kept the cyclophosphamide dose reduction at 400 mg/m].  PET/CT 11/10/2015: Shows improvement in most lesions but is noted to have a new FDG avid lymph node in the mesentery of the small bowel.  Cycle 5 on 11/13/2015 R CHOP - received full dose.  Cycle 6 on 12/11/2015 R-CHOP full dose.  Admitted with neutropenic fevers and treatment for possible early pneumonia.  This resolved and he was able to discharge home  01/09/2016 through 01/22/2016: The patient was treated to the C4-C6 levels of the spine to a dose of 30 gray in 10 fractions using a 2 field technique. The patient was also treated to an abdominal lymph node region to a dose of 30 gray in 10 fractions using a 3 field technique. This treatment consisted of a 3-D conformal technique with daily image guidance utilized.   Repeat PET CT scan in March 12 2016 -showed some enlarged mesenteric lymph nodes which were FDG avid.  CT guided FNA of mesenteric lymph nodes at [facility] on 03/29/2016 - showed scant cellularity. No overt evidence of lymphoma.  Rpt PET/CT 05/15/2016 at [facility]- show similar appearing enlarged mesenteric lymph nodes which are FDG avid. No change in size and no other evidence of disease progression.  PET/CT 07/24/2016 - and [facility] shows no evidence of disease progression  PET/CT 01/22/2017:  Result Impression   1.Stable low-level metabolic activity in small lymph nodes in the small bowel mesentery. No new FDG avid disease. 2.Persistent hypermetabolic soft tissue thickening overlying the coccyx which could relate to a decubitus ulcer and should be amenable to  direct inspection. 3.Stable pulmonary nodules which do not appear hypermetabolic, however, several are below the size resolution of PET in size. Continued attention on follow-up is suggested. 4.Previously seen hypermetabolic skin thickening near the right ear has resolved. 5.Ancillary CT findings as above.    INTERVAL HISTORY:  Ethan Stewart is here for management and evaluation of his recently diagnosed Follicular Lymphoma, grade 1-2. Pt is here for C18 of Rituxan. The patient's last visit with [place] was on 03/15/2020. The pt reports that he is doing well overall.  The pt reports that he has been feeling well and has no new concerns. He has had some lower extremity numbness that is not new.   Lab results today (04/12/20) of CBC w/diff and CMP is as follows: all values are WNL except for WBC at 2.9K, PLT at 125K, Neutro Abs at 1.1K, Calcium at 8.6, Total Protein at 6.0, ALT at 45.  On review of systems, pt reports lower extremity numbness and denies new lumps/bumps, leg pain/swelling, constipation, diarrhea, changes in urinary habits, fevers, chills, night sweats, unexpected weight loss, testicular pain/swelling and any other symptoms.    REVIEW OF SYSTEMS:   A 10+ POINT REVIEW OF SYSTEMS WAS OBTAINED including neurology, dermatology, psychiatry, cardiac, respiratory, lymph, extremities, GI, GU, Musculoskeletal, constitutional, breasts, reproductive, HEENT.  All pertinent positives are noted in the HPI.  All others are negative.    ALLERGIES:  is allergic to neulasta [pegfilgrastim].  MEDICATIONS:  Current Outpatient Medications  Medication Sig Dispense Refill  . diazepam (VALIUM) 2 MG tablet Take 2 mg by mouth daily as needed for anxiety.    . ergocalciferol (VITAMIN D2) 1.25 MG (50000 UT) capsule Take 1 capsule (50,000 Units total) by mouth once a week. 12 capsule 4  . HYDROcodone-acetaminophen (NORCO) 5-325 MG tablet Take 1-2 tablets by mouth every 6 (six) hours as needed for moderate  pain or severe pain. (Patient not taking: Reported on 01/17/2020) 20 tablet 0  . lenalidomide (REVLIMID) 15 MG capsule Take 1 capsule (15 mg total) by mouth daily. 3 weeks on 1 week off. 21 capsule 0  . levothyroxine (SYNTHROID, LEVOTHROID) 88 MCG tablet Take 88 mcg by mouth daily before breakfast.   2  . lidocaine-prilocaine (EMLA) cream Apply 1 application topically as needed (for port access).    . Multiple Vitamin (MULTIVITAMIN WITH MINERALS) TABS tablet Take 1 tablet by mouth daily.     . ondansetron (ZOFRAN) 8 MG tablet Take 1 tablet (8 mg total) by mouth every 8 (eight) hours as needed for nausea or vomiting. (Patient not taking: Reported on 01/17/2020) 30 tablet 1  . polyethylene glycol (MIRALAX / GLYCOLAX) 17 g packet Take 17 g by mouth daily. Takes with revlimid    . prochlorperazine (COMPAZINE) 10 MG tablet Take 1 tablet (10 mg total) by mouth every 8 (eight) hours as needed for nausea or vomiting. 30 tablet 0  . rivaroxaban (XARELTO) 10 MG TABS tablet Take 1 tablet (10 mg total) by mouth daily. 30 tablet 4  . senna (SENOKOT) 8.6 MG TABS tablet Take 2 tablets by mouth at bedtime.    . simvastatin (ZOCOR) 20 MG tablet Take 20 mg by mouth daily.    . tamsulosin (FLOMAX) 0.4 MG CAPS capsule Take 1  capsule (0.4 mg total) by mouth at bedtime. 30 capsule 3  . temazepam (RESTORIL) 15 MG capsule Take 15 mg by mouth at bedtime as needed for sleep.     . Vitamins/Minerals TABS Take by mouth.     No current facility-administered medications for this visit.    PHYSICAL EXAMINATION: ECOG FS:1 - Symptomatic but completely ambulatory  Vitals:   04/12/20 0944  BP: (!) 141/69  Pulse: (!) 46  Resp: 18  Temp: 98.3 F (36.8 C)  SpO2: 100%   Wt Readings from Last 3 Encounters:  04/12/20 226 lb 1.6 oz (102.6 kg)  03/15/20 227 lb 1.6 oz (103 kg)  02/16/20 229 lb 12.8 oz (104.2 kg)   Body mass index is 29.83 kg/m.    GENERAL:alert, in no acute distress and comfortable SKIN: no acute rashes,  no significant lesions EYES: conjunctiva are pink and non-injected, sclera anicteric OROPHARYNX: MMM, no exudates, no oropharyngeal erythema or ulceration NECK: supple, no JVD LYMPH:  no palpable lymphadenopathy in the cervical, axillary or inguinal regions LUNGS: clear to auscultation b/l with normal respiratory effort HEART: regular rate & rhythm ABDOMEN:  normoactive bowel sounds , non tender, not distended. No palpable hepatosplenomegaly.  Extremity: no pedal edema PSYCH: alert & oriented x 3 with fluent speech NEURO: no focal motor/sensory deficits   LABORATORY DATA:   CBC Latest Ref Rng & Units 04/12/2020 03/15/2020 02/16/2020  WBC 4.0 - 10.5 K/uL 2.9(L) 3.1(L) 3.5(L)  Hemoglobin 13.0 - 17.0 g/dL 13.7 14.1 14.4  Hematocrit 39.0 - 52.0 % 40.1 42.0 41.3  Platelets 150 - 400 K/uL 125(L) 138(L) 107(L)     CMP Latest Ref Rng & Units 04/12/2020 03/15/2020 02/16/2020  Glucose 70 - 99 mg/dL 86 80 86  BUN 8 - 23 mg/dL _0 Creatinine 0.61 - 1.24 mg/dL 0.94 0.99 1.04  Sodium 135 - 145 mmol/L 143 142 143  Potassium 3.5 - 5.1 mmol/L 4.4 4.4 4.1  Chloride 98 - 111 mmol/L 110 110 108  CO2 22 - 32 mmol/L _1 Calcium 8.9 - 10.3 mg/dL 8.6(L) 8.9 8.9  Total Protein 6.5 - 8.1 g/dL 6.0(L) 6.1(L) 6.2(L)  Total Bilirubin 0.3 - 1.2 mg/dL 0.6 0.7 0.5  Alkaline Phos 38 - 126 U/L 106 106 118  AST 15 - 41 U/L 38 31 23  ALT 0 - 44 U/L 45(H) 35 25   . Lab Results  Component Value Date   LDH 270 (H) 10/21/2019    Fine Needle Aspiration, Lymph NodeResulted: 12/04/2018 1:14 PM [facility] Result Narrative  ACCESSION NUMBER: [id number] RECEIVED: 11/27/2018 ORDERING PHYSICIAN: RAKHEE Megan Mans , MD PATIENT NAME: Docia Barrier LYN CYTOLOGY REPORT * Amended * Final Cytologic Interpretation AMENDED REPORT  AMENDED 12/04/2018 TO ADD DIAGNOSIS   A. Right inguinal lymph node, Fine Needle Aspiration II (smears and cell block): B-cell lymphoma with germinal  center phenotype (see comment)  Specimen Adequacy: Satisfactory for evaluation.  B. Cytology core biopsy: B-cell lymphoma with germinal center phenotype (see comment)  Specimen Adequacy: Satisfactory for evaluation.    COMMENT:Sections of the core biopsy demonstrate an atypical vaguely nodular lymphoid infiltrate composed of medium sized lymphoid cells with mature chromatin, irregular nuclear membranes and scant cytoplasm. Focally some areas show larger cells. Few mitotic figures are present. Appropriately controlled immunohistochemical stains are performed. Sheets of B-cells are highlighted by CD20. These B-cells co-express BCL2 and BCL6 (focal, nodular distribution). They are negative for CD30 and MYC. CD21 demonstrates few residual disrupted  follicular dendritic meshworks. The proliferation index is low (10-20%), as demonstrated by Ki-67. Scattered small T-cells are highlighted by CD3. Concurrent flow cytometry identified a kappa light chain restricted B-cell population, co-expressing CD10.  Overall, the findings are consistent with involvement by a B-cell lymphoma with a germinal center phenotype (GC-type), with focal follicular architecture and a low proliferation index. Given the nature of the sample (i.e. core biopsy) a final architectural subclassification is not possible. If clinically feasible, an excisional biopsy is recommended.  Correlation with clinical and cytogenetic/molecular data is recommended.    12/11/18 Inguinal LN Biopsy  Interpretation Tissue-Flow Cytometry - MONOCLONAL B-CELL POPULATION EXPRESSING CD10 COMPRISES 92% OF ALL LYMPHOCYTES - SEE COMMENT Microscopic Gated population: Flow cytometric immunophenotyping is performed using antibiodies to the antigens listed in the table below. Electronic gates are placed around a cell cluster displaying light scatter properties corresponding to lymphocytes. - Abnormal Cells in gated  population: 92 % - Phenotype of Abnormal Cells: CD10, CD19, CD20, CD21, CD22, HLA-Dr, Kappa Diagnosis Lymph node for lymphoma, Deep Right Inguinal - FOLLICULAR LYMPHOMA, GRADE 1-2 - SEE COMMENT Microscopic Comment The nodal architecture is effaced by a proliferation back to back follicles composed of medium to large lymphocytes with irregular, cleaved nuclei and scattered centroblasts. There are no diffuse areas of growth present within the examined nodal material. Immunohistochemistry performed on the tissue highlights a nodular B-cell proliferation with expression of CD20, CD10, bcl-6, and Bcl-2. CD21 highlights expanded follicular dendritic meshworks. The Ki-67 shows a slightly increased proliferation rate of 30-40%. The atypical lymphocytes are negative for CD5. CD3 immunohistochemistry highlights the background T-cells. Flow cytometry was performed. A monoclonal B-cell populaton expressing CD10 comprises 92% of all lymphocytes (See [phone]). Overall, the findings are consistent with a diagnosis of low grade follicular lymphoma, Grade 1-2. Of note, the increased ki-67 may portend a more aggressive course despite the low histologic grade.   01/18/19 BM Biopsy:   05/05/19 BM Biopsy:   10/22/202 CT CHEST, ABDOMEN, AND PELVIS WITH CONTRAST (Accession [id number])    RADIOGRAPHIC STUDIES: I have personally reviewed the radiological images as listed and agreed with the findings in the report. No results found.    ASSESSMENT & PLAN:   Ethan Stewart is a 74 y.o. male with:  1) Diffuse large B-cell lymphoma stage IV AE with involvement of mesenteric, inguinal lymph nodes and C5 vertebral body with spinal cord impingement  Patient is status post six cycles of R-CHOP and R-mini-CHOP, completed in 2016, as noted above.  2) History of capillary leak syndrome with ARDS requiring intubation likely due to G-CSF.  3)  Follicular Lymphoma, Grade 1-2 currently on Revlimid +  Rituxan  11/02/18 CT A/P which revealed Negative for inguinal or bowel hernia but positive for bulky right inguinal and external iliac lymphadenopathy which is new since a 2017 PET-CT and highly suspicious for recurrent Lymphoma. This would be amenable to Ultrasound-guided needle biopsy. 2. No abdominal or contralateral left hemi pelvic lymphadenopathy. No other acute findings identified in the abdomen or pelvis. 3. Chronic pneumobilia.  Aortic Atherosclerosis.  11/12/18 PET/CT which revealed  Interval development of multistation lymphadenopathy in the abdomen and pelvis as detailed above- [place] 5. 2. Presence of pneumobilia could reflect sphincter of Oddi dysfunction versus recent procedure. Correlate with clinical history and upcoming comprehensive metabolic panel. 3. Ancillary CT findings   11/27/18 US guided LN Biopsy pathology report is as noted above. " B-cell lymphoma with a germinal center phenotype (GC-type), with focal follicular architecture and a low  proliferation index. Given the nature of the sample (i.e. core biopsy) a final architectural subclassification is not possible. If clinically feasible, an excisional biopsy is recommended."  12/11/18 Right Inguinal LN biopsy revealed Grade 1-2 Follicular Lymphoma  05/24/94 CT A/P revealed Today's study demonstrates clear progression of disease with increased number and size of numerous enlarged lymph nodes throughout the lower thorax, abdomen and pelvis, as detailed above. 2. In addition, there are several small pulmonary nodules in the lung bases. The largest of these are clustered in the posterior aspect of the right lower lobe in an apparent area of scarring which is very similar to prior PET-CT 03/12/2016, favored to be benign. However, the smaller nodules which measure up to 6 mm in size are nonspecific and warrant attention on follow-up studies. 3. Aortic atherosclerosis, in addition to least 2 vessel coronary artery disease.  Assessment for potential risk factor modification, dietary therapy or pharmacologic therapy may be warranted, if clinically indicated. 4. Additional incidental findings, as above. 01/18/19 BM Bx revealed normocellular bone marrow with trilineage hematopoiesis Stage II indicated with 01/26/19 CT  Began C1 of 23m Revlimid and Rituxan on 02/05/19  05/05/19 BM Bx revealed normocellular bone marrow with erythroid hyperplasia, and no evidence of lymphoma  05/11/19 PET/CT revealed "Near complete metabolic response to therapy of lymphoma since the prior PET of 11/12/2018. Low-level hypermetabolism at the site of adenopathy within the abdominal retroperitoneum persists. (Deauville) 2. There has also been resolution of abdominopelvic adenopathy since the diagnostic CT of 01/26/2019. 2. Nonspecific right lower lobe pulmonary nodules, similar. 3. Coronary artery atherosclerosis. Aortic Atherosclerosis."   10/14/2019 CT C/A/P ([id number] revealed " 1. No findings of active lymphoma. 2. Pulmonary nodules in the right lung are mostly stable from March  2017, and considered benign, although one 5 mm right lower lobe nodule medially on image 115/6 is stable of the last year but not well seen on 03/12/2016 and may merit surveillance. 3. Reduced prominence of treatment related stranding in the left periaortic region likely the residua of prior adenopathy. 4. Other imaging findings of potential clinical significance:Coronary atherosclerosis. Mild cardiomegaly. Cholelithiasis. Mild prostatomegaly. Mild lumbar spondylosis and degenerative disc disease causing mild impingement at L3-4 and L4-5. Small left renal peripelvic cysts. Prior bilateral groin hernia repairs. Pneumobilia, query prior sphincterotomy."  4) History of right lower extremity distal DVT. Resolved. S/p 6 months of treatment in 02/2016 -back on 175mXarelto while taking Revlimid  5) Mild chronic thrombocytopenia-  128k   PLAN: -Discussed pt labwork today,  04/12/20; mild neutropenia - likely from Revlimid, Calcium is low, ALT is borderline elevated. -No lab or clinical evidence of the progression of pt's lymphoma this time. -The pt has no prohibitive toxicities from continuing C18 Rituxan at this time.  -Will move to maintenance Rituxan every 2 months after C18 -Advised pt to hold Revlimid for 2-3 days before restarting to give Neutrophils time to bounce back.  -Continue 15 mg Revlimid for 3 weeks (starting in 2-3 days). This will be his last cycle of Revlimid.  -Recommend pt to discontinue Xarelto one month after stopping Revlimid.  -Pt not a good candidate for prophylactic baby ASA due to PMHx  -Will repeat CT C/A/P in 7 weeks     FOLLOW UP: -CT chest/abd/pelvis in 7 weeks -Plz schedule labs, portflush and next dose of Rituxan in 60 days (switching to q60 days maintenance Rituxan).     The total time spent in the appt was 30 minutes and more than 50% was on  counseling and direct patient cares.  All of the patient's questions were answered with apparent satisfaction. The patient knows to call the clinic with any problems, questions or concerns.   Sullivan Lone MD [place] [facility]  (Office):       [phone] (Work cell):  [phone] (Fax):           [phone]   I, Yevette Edwards, am acting as a scribe for Dr. Sullivan Lone.   .I have reviewed the above documentation for accuracy and completeness, and I agree with the above. Brunetta Genera MD

## 2020-04-12 ENCOUNTER — Inpatient Hospital Stay: Payer: No Typology Code available for payment source | Attending: Hematology

## 2020-04-12 ENCOUNTER — Other Ambulatory Visit: Payer: Self-pay

## 2020-04-12 ENCOUNTER — Inpatient Hospital Stay: Payer: No Typology Code available for payment source

## 2020-04-12 ENCOUNTER — Inpatient Hospital Stay (HOSPITAL_BASED_OUTPATIENT_CLINIC_OR_DEPARTMENT_OTHER): Payer: No Typology Code available for payment source | Admitting: Hematology

## 2020-04-12 VITALS — BP 141/69 | HR 46 | Temp 98.3°F | Resp 18 | Ht 73.0 in | Wt 226.1 lb

## 2020-04-12 VITALS — BP 144/73 | HR 45 | Temp 97.6°F | Resp 16

## 2020-04-12 DIAGNOSIS — C8298 Follicular lymphoma, unspecified, lymph nodes of multiple sites: Secondary | ICD-10-CM | POA: Insufficient documentation

## 2020-04-12 DIAGNOSIS — Z298 Encounter for other specified prophylactic measures: Secondary | ICD-10-CM

## 2020-04-12 DIAGNOSIS — Z95828 Presence of other vascular implants and grafts: Secondary | ICD-10-CM

## 2020-04-12 DIAGNOSIS — C8330 Diffuse large B-cell lymphoma, unspecified site: Secondary | ICD-10-CM | POA: Diagnosis not present

## 2020-04-12 DIAGNOSIS — Z5112 Encounter for antineoplastic immunotherapy: Secondary | ICD-10-CM | POA: Insufficient documentation

## 2020-04-12 LAB — CMP (CANCER CENTER ONLY)
ALT: 45 U/L — ABNORMAL HIGH (ref 0–44)
AST: 38 U/L (ref 15–41)
Albumin: 3.8 g/dL (ref 3.5–5.0)
Alkaline Phosphatase: 106 U/L (ref 38–126)
Anion gap: 9 (ref 5–15)
BUN: 12 mg/dL (ref 8–23)
CO2: 24 mmol/L (ref 22–32)
Calcium: 8.6 mg/dL — ABNORMAL LOW (ref 8.9–10.3)
Chloride: 110 mmol/L (ref 98–111)
Creatinine: 0.94 mg/dL (ref 0.61–1.24)
GFR, Est AFR Am: >60 mL/min (ref >60)
GFR, Estimated: >60 mL/min (ref >60)
Glucose, Bld: 86 mg/dL (ref 70–99)
Potassium: 4.4 mmol/L (ref 3.5–5.1)
Sodium: 143 mmol/L (ref 135–145)
Total Bilirubin: 0.6 mg/dL (ref 0.3–1.2)
Total Protein: 6 g/dL — ABNORMAL LOW (ref 6.5–8.1)

## 2020-04-12 LAB — CBC WITH DIFFERENTIAL/PLATELET
Abs Immature Granulocytes: 0.01 10*3/uL (ref 0.00–0.07)
Basophils Absolute: 0.1 10*3/uL (ref 0.0–0.1)
Basophils Relative: 3 %
Eosinophils Absolute: 0.1 10*3/uL (ref 0.0–0.5)
Eosinophils Relative: 5 %
HCT: 40.1 % (ref 39.0–52.0)
Hemoglobin: 13.7 g/dL (ref 13.0–17.0)
Immature Granulocytes: 0 %
Lymphocytes Relative: 35 %
Lymphs Abs: 1 10*3/uL (ref 0.7–4.0)
MCH: 30.9 pg (ref 26.0–34.0)
MCHC: 34.2 g/dL (ref 30.0–36.0)
MCV: 90.5 fL (ref 80.0–100.0)
Monocytes Absolute: 0.5 10*3/uL (ref 0.1–1.0)
Monocytes Relative: 17 %
Neutro Abs: 1.1 10*3/uL — ABNORMAL LOW (ref 1.7–7.7)
Neutrophils Relative %: 40 %
Platelets: 125 10*3/uL — ABNORMAL LOW (ref 150–400)
RBC: 4.43 MIL/uL (ref 4.22–5.81)
RDW: 14.1 % (ref 11.5–15.5)
WBC: 2.9 10*3/uL — ABNORMAL LOW (ref 4.0–10.5)
nRBC: 0 % (ref 0.0–0.2)

## 2020-04-12 MED ORDER — METHYLPREDNISOLONE SODIUM SUCC 125 MG IJ SOLR
125.0000 mg | Freq: Once | INTRAMUSCULAR | Status: AC
  Administered 2020-04-12: 11:00:00 125 mg via INTRAVENOUS

## 2020-04-12 MED ORDER — DIPHENHYDRAMINE HCL 25 MG PO CAPS
ORAL_CAPSULE | ORAL | Status: AC
  Filled 2020-04-12: qty 2

## 2020-04-12 MED ORDER — SODIUM CHLORIDE 0.9 % IV SOLN
Freq: Once | INTRAVENOUS | Status: AC
  Filled 2020-04-12: qty 250

## 2020-04-12 MED ORDER — SODIUM CHLORIDE 0.9% FLUSH
10.0000 mL | INTRAVENOUS | Status: DC | PRN
  Administered 2020-04-12: 10 mL
  Filled 2020-04-12: qty 10

## 2020-04-12 MED ORDER — HEPARIN SOD (PORK) LOCK FLUSH 100 UNIT/ML IV SOLN
500.0000 [IU] | Freq: Once | INTRAVENOUS | Status: AC | PRN
  Administered 2020-04-12: 500 [IU]
  Filled 2020-04-12: qty 5

## 2020-04-12 MED ORDER — ACETAMINOPHEN 325 MG PO TABS
650.0000 mg | ORAL_TABLET | Freq: Once | ORAL | Status: AC
  Administered 2020-04-12: 650 mg via ORAL

## 2020-04-12 MED ORDER — SODIUM CHLORIDE 0.9% FLUSH
10.0000 mL | Freq: Once | INTRAVENOUS | Status: AC
  Administered 2020-04-12: 10:00:00 10 mL
  Filled 2020-04-12: qty 10

## 2020-04-12 MED ORDER — DIPHENHYDRAMINE HCL 25 MG PO CAPS
50.0000 mg | ORAL_CAPSULE | Freq: Once | ORAL | Status: AC
  Administered 2020-04-12: 50 mg via ORAL

## 2020-04-12 MED ORDER — ACETAMINOPHEN 325 MG PO TABS
ORAL_TABLET | ORAL | Status: AC
  Filled 2020-04-12: qty 2

## 2020-04-12 MED ORDER — METHYLPREDNISOLONE SODIUM SUCC 125 MG IJ SOLR
INTRAMUSCULAR | Status: AC
  Filled 2020-04-12: qty 2

## 2020-04-12 MED ORDER — SODIUM CHLORIDE 0.9 % IV SOLN
375.0000 mg/m2 | Freq: Once | INTRAVENOUS | Status: AC
  Administered 2020-04-12: 900 mg via INTRAVENOUS
  Filled 2020-04-12: qty 40

## 2020-04-12 NOTE — Patient Instructions (Addendum)
[facility] Discharge Instructions for Patients Receiving Chemotherapy  Today you received the following chemotherapy agents: rituximab.  To help prevent nausea and vomiting after your treatment, we encourage you to take your nausea medication as directed.   If you develop nausea and vomiting that is not controlled by your nausea medication, call the clinic.   BELOW ARE SYMPTOMS THAT SHOULD BE REPORTED IMMEDIATELY:  *FEVER GREATER THAN 100.5 F  *CHILLS WITH OR WITHOUT FEVER  NAUSEA AND VOMITING THAT IS NOT CONTROLLED WITH YOUR NAUSEA MEDICATION  *UNUSUAL SHORTNESS OF BREATH  *UNUSUAL BRUISING OR BLEEDING  TENDERNESS IN MOUTH AND THROAT WITH OR WITHOUT PRESENCE OF ULCERS  *URINARY PROBLEMS  *BOWEL PROBLEMS  UNUSUAL RASH Items with * indicate a potential emergency and should be followed up as soon as possible.  Feel free to call the clinic should you have any questions or concerns. The clinic phone number is [phone].  Please show the CHEMO ALERT CARD at check-in to the Emergency Department and triage nurse.   

## 2020-04-13 ENCOUNTER — Telehealth: Payer: Self-pay | Admitting: Hematology

## 2020-04-13 NOTE — Telephone Encounter (Signed)
Scheduled per 04/21 los, patient has been called and voicemail was left.

## 2020-04-14 DIAGNOSIS — D485 Neoplasm of uncertain behavior of skin: Secondary | ICD-10-CM | POA: Diagnosis not present

## 2020-04-14 DIAGNOSIS — Z85828 Personal history of other malignant neoplasm of skin: Secondary | ICD-10-CM | POA: Diagnosis not present

## 2020-04-14 DIAGNOSIS — L578 Other skin changes due to chronic exposure to nonionizing radiation: Secondary | ICD-10-CM | POA: Diagnosis not present

## 2020-04-14 DIAGNOSIS — L821 Other seborrheic keratosis: Secondary | ICD-10-CM | POA: Diagnosis not present

## 2020-04-14 DIAGNOSIS — D3617 Benign neoplasm of peripheral nerves and autonomic nervous system of trunk, unspecified: Secondary | ICD-10-CM | POA: Diagnosis not present

## 2020-04-14 DIAGNOSIS — L918 Other hypertrophic disorders of the skin: Secondary | ICD-10-CM | POA: Diagnosis not present

## 2020-04-14 DIAGNOSIS — L570 Actinic keratosis: Secondary | ICD-10-CM | POA: Diagnosis not present

## 2020-04-14 DIAGNOSIS — L814 Other melanin hyperpigmentation: Secondary | ICD-10-CM | POA: Diagnosis not present

## 2020-04-14 DIAGNOSIS — L820 Inflamed seborrheic keratosis: Secondary | ICD-10-CM | POA: Diagnosis not present

## 2020-05-11 ENCOUNTER — Other Ambulatory Visit: Payer: No Typology Code available for payment source

## 2020-05-11 ENCOUNTER — Ambulatory Visit: Payer: No Typology Code available for payment source

## 2020-05-11 ENCOUNTER — Ambulatory Visit: Payer: No Typology Code available for payment source | Admitting: Hematology

## 2020-05-29 ENCOUNTER — Telehealth: Payer: Self-pay | Admitting: Hematology

## 2020-05-29 ENCOUNTER — Telehealth: Payer: Self-pay

## 2020-05-29 NOTE — Telephone Encounter (Signed)
TCT patient regarding getting labs done prior to his CT appt on 05/31/20, unable to reach pt. LVM.  Scheduling message sent for lab appt on 05/30/20.

## 2020-05-29 NOTE — Telephone Encounter (Signed)
Scheduled appt per 6/7 sch message - unable to reach pt . Left message with appt date and time

## 2020-05-30 ENCOUNTER — Inpatient Hospital Stay: Payer: No Typology Code available for payment source | Attending: Hematology

## 2020-05-30 ENCOUNTER — Other Ambulatory Visit: Payer: Self-pay

## 2020-05-30 DIAGNOSIS — C8298 Follicular lymphoma, unspecified, lymph nodes of multiple sites: Secondary | ICD-10-CM | POA: Insufficient documentation

## 2020-05-30 DIAGNOSIS — Z5112 Encounter for antineoplastic immunotherapy: Secondary | ICD-10-CM | POA: Insufficient documentation

## 2020-05-30 DIAGNOSIS — Z298 Encounter for other specified prophylactic measures: Secondary | ICD-10-CM

## 2020-05-30 LAB — CBC WITH DIFFERENTIAL/PLATELET
Abs Immature Granulocytes: 0.01 10*3/uL (ref 0.00–0.07)
Basophils Absolute: 0 10*3/uL (ref 0.0–0.1)
Basophils Relative: 0 %
Eosinophils Absolute: 0.1 10*3/uL (ref 0.0–0.5)
Eosinophils Relative: 2 %
HCT: 40.5 % (ref 39.0–52.0)
Hemoglobin: 14.2 g/dL (ref 13.0–17.0)
Immature Granulocytes: 0 %
Lymphocytes Relative: 25 %
Lymphs Abs: 0.9 10*3/uL (ref 0.7–4.0)
MCH: 31.6 pg (ref 26.0–34.0)
MCHC: 35.1 g/dL (ref 30.0–36.0)
MCV: 90 fL (ref 80.0–100.0)
Monocytes Absolute: 0.4 10*3/uL (ref 0.1–1.0)
Monocytes Relative: 12 %
Neutro Abs: 2.2 10*3/uL (ref 1.7–7.7)
Neutrophils Relative %: 61 %
Platelets: 123 10*3/uL — ABNORMAL LOW (ref 150–400)
RBC: 4.5 MIL/uL (ref 4.22–5.81)
RDW: 13.7 % (ref 11.5–15.5)
WBC: 3.6 10*3/uL — ABNORMAL LOW (ref 4.0–10.5)
nRBC: 0 % (ref 0.0–0.2)

## 2020-05-30 LAB — CMP (CANCER CENTER ONLY)
ALT: 30 U/L (ref 0–44)
AST: 30 U/L (ref 15–41)
Albumin: 4.2 g/dL (ref 3.5–5.0)
Alkaline Phosphatase: 118 U/L (ref 38–126)
Anion gap: 9 (ref 5–15)
BUN: 15 mg/dL (ref 8–23)
CO2: 25 mmol/L (ref 22–32)
Calcium: 9.1 mg/dL (ref 8.9–10.3)
Chloride: 109 mmol/L (ref 98–111)
Creatinine: 1.13 mg/dL (ref 0.61–1.24)
GFR, Est AFR Am: >60 mL/min (ref >60)
GFR, Estimated: >60 mL/min (ref >60)
Glucose, Bld: 107 mg/dL — ABNORMAL HIGH (ref 70–99)
Potassium: 4.1 mmol/L (ref 3.5–5.1)
Sodium: 143 mmol/L (ref 135–145)
Total Bilirubin: 0.8 mg/dL (ref 0.3–1.2)
Total Protein: 6.3 g/dL — ABNORMAL LOW (ref 6.5–8.1)

## 2020-05-31 ENCOUNTER — Ambulatory Visit (HOSPITAL_COMMUNITY)
Admission: RE | Admit: 2020-05-31 | Discharge: 2020-05-31 | Disposition: A | Discharge status: Home / Self Care | Payer: No Typology Code available for payment source | Source: Ambulatory Visit | Attending: Hematology | Admitting: Hematology

## 2020-05-31 ENCOUNTER — Encounter (HOSPITAL_COMMUNITY): Payer: Self-pay

## 2020-05-31 DIAGNOSIS — C8330 Diffuse large B-cell lymphoma, unspecified site: Secondary | ICD-10-CM | POA: Diagnosis present

## 2020-05-31 DIAGNOSIS — Z298 Encounter for other specified prophylactic measures: Secondary | ICD-10-CM | POA: Insufficient documentation

## 2020-05-31 DIAGNOSIS — C8298 Follicular lymphoma, unspecified, lymph nodes of multiple sites: Secondary | ICD-10-CM

## 2020-05-31 MED ORDER — IOHEXOL 300 MG/ML  SOLN
100.0000 mL | Freq: Once | INTRAMUSCULAR | Status: AC | PRN
  Administered 2020-05-31: 100 mL via INTRAVENOUS

## 2020-05-31 MED ORDER — SODIUM CHLORIDE (PF) 0.9 % IJ SOLN
INTRAMUSCULAR | Status: AC
  Filled 2020-05-31: qty 50

## 2020-06-12 ENCOUNTER — Other Ambulatory Visit: Payer: Self-pay | Admitting: Hematology

## 2020-06-12 ENCOUNTER — Other Ambulatory Visit: Payer: Self-pay

## 2020-06-12 ENCOUNTER — Inpatient Hospital Stay: Payer: No Typology Code available for payment source

## 2020-06-12 ENCOUNTER — Inpatient Hospital Stay (HOSPITAL_BASED_OUTPATIENT_CLINIC_OR_DEPARTMENT_OTHER): Payer: No Typology Code available for payment source | Admitting: Hematology

## 2020-06-12 VITALS — BP 140/79 | HR 57 | Temp 97.8°F | Resp 16

## 2020-06-12 DIAGNOSIS — Z298 Encounter for other specified prophylactic measures: Secondary | ICD-10-CM

## 2020-06-12 DIAGNOSIS — C8298 Follicular lymphoma, unspecified, lymph nodes of multiple sites: Secondary | ICD-10-CM

## 2020-06-12 DIAGNOSIS — C8330 Diffuse large B-cell lymphoma, unspecified site: Secondary | ICD-10-CM

## 2020-06-12 DIAGNOSIS — Z5112 Encounter for antineoplastic immunotherapy: Secondary | ICD-10-CM | POA: Diagnosis not present

## 2020-06-12 DIAGNOSIS — Z95828 Presence of other vascular implants and grafts: Secondary | ICD-10-CM

## 2020-06-12 LAB — LACTATE DEHYDROGENASE: LDH: 217 U/L — ABNORMAL HIGH (ref 98–192)

## 2020-06-12 LAB — CBC WITH DIFFERENTIAL/PLATELET
Abs Immature Granulocytes: 0.01 10*3/uL (ref 0.00–0.07)
Basophils Absolute: 0 10*3/uL (ref 0.0–0.1)
Basophils Relative: 0 %
Eosinophils Absolute: 0.1 10*3/uL (ref 0.0–0.5)
Eosinophils Relative: 2 %
HCT: 41 % (ref 39.0–52.0)
Hemoglobin: 14.3 g/dL (ref 13.0–17.0)
Immature Granulocytes: 0 %
Lymphocytes Relative: 27 %
Lymphs Abs: 0.9 10*3/uL (ref 0.7–4.0)
MCH: 31.7 pg (ref 26.0–34.0)
MCHC: 34.9 g/dL (ref 30.0–36.0)
MCV: 90.9 fL (ref 80.0–100.0)
Monocytes Absolute: 0.4 10*3/uL (ref 0.1–1.0)
Monocytes Relative: 13 %
Neutro Abs: 1.9 10*3/uL (ref 1.7–7.7)
Neutrophils Relative %: 58 %
Platelets: 103 10*3/uL — ABNORMAL LOW (ref 150–400)
RBC: 4.51 MIL/uL (ref 4.22–5.81)
RDW: 13.7 % (ref 11.5–15.5)
WBC: 3.3 10*3/uL — ABNORMAL LOW (ref 4.0–10.5)
nRBC: 0 % (ref 0.0–0.2)

## 2020-06-12 LAB — CMP (CANCER CENTER ONLY)
ALT: 29 U/L (ref 0–44)
AST: 28 U/L (ref 15–41)
Albumin: 4 g/dL (ref 3.5–5.0)
Alkaline Phosphatase: 108 U/L (ref 38–126)
Anion gap: 8 (ref 5–15)
BUN: 17 mg/dL (ref 8–23)
CO2: 26 mmol/L (ref 22–32)
Calcium: 9.1 mg/dL (ref 8.9–10.3)
Chloride: 109 mmol/L (ref 98–111)
Creatinine: 1 mg/dL (ref 0.61–1.24)
GFR, Est AFR Am: >60 mL/min (ref >60)
GFR, Estimated: >60 mL/min (ref >60)
Glucose, Bld: 85 mg/dL (ref 70–99)
Potassium: 4.3 mmol/L (ref 3.5–5.1)
Sodium: 143 mmol/L (ref 135–145)
Total Bilirubin: 0.6 mg/dL (ref 0.3–1.2)
Total Protein: 6.1 g/dL — ABNORMAL LOW (ref 6.5–8.1)

## 2020-06-12 MED ORDER — ACETAMINOPHEN 325 MG PO TABS
650.0000 mg | ORAL_TABLET | Freq: Once | ORAL | Status: AC
  Administered 2020-06-12: 650 mg via ORAL

## 2020-06-12 MED ORDER — ACETAMINOPHEN 325 MG PO TABS
ORAL_TABLET | ORAL | Status: AC
  Filled 2020-06-12: qty 2

## 2020-06-12 MED ORDER — DIPHENHYDRAMINE HCL 25 MG PO CAPS
ORAL_CAPSULE | ORAL | Status: AC
  Filled 2020-06-12: qty 2

## 2020-06-12 MED ORDER — METHYLPREDNISOLONE SODIUM SUCC 125 MG IJ SOLR
125.0000 mg | Freq: Once | INTRAMUSCULAR | Status: AC
  Administered 2020-06-12: 125 mg via INTRAVENOUS

## 2020-06-12 MED ORDER — SODIUM CHLORIDE 0.9 % IV SOLN
375.0000 mg/m2 | Freq: Once | INTRAVENOUS | Status: AC
  Administered 2020-06-12: 900 mg via INTRAVENOUS
  Filled 2020-06-12: qty 40

## 2020-06-12 MED ORDER — SODIUM CHLORIDE 0.9% FLUSH
10.0000 mL | Freq: Once | INTRAVENOUS | Status: AC
  Administered 2020-06-12: 10 mL
  Filled 2020-06-12: qty 10

## 2020-06-12 MED ORDER — METHYLPREDNISOLONE SODIUM SUCC 125 MG IJ SOLR
INTRAMUSCULAR | Status: AC
  Filled 2020-06-12: qty 2

## 2020-06-12 MED ORDER — SODIUM CHLORIDE 0.9 % IV SOLN
Freq: Once | INTRAVENOUS | Status: AC
  Filled 2020-06-12: qty 250

## 2020-06-12 MED ORDER — SODIUM CHLORIDE 0.9% FLUSH
10.0000 mL | INTRAVENOUS | Status: DC | PRN
  Administered 2020-06-12: 10 mL
  Filled 2020-06-12: qty 10

## 2020-06-12 MED ORDER — DIPHENHYDRAMINE HCL 25 MG PO CAPS
50.0000 mg | ORAL_CAPSULE | Freq: Once | ORAL | Status: AC
  Administered 2020-06-12: 50 mg via ORAL

## 2020-06-12 MED ORDER — HEPARIN SOD (PORK) LOCK FLUSH 100 UNIT/ML IV SOLN
500.0000 [IU] | Freq: Once | INTRAVENOUS | Status: AC | PRN
  Administered 2020-06-12: 500 [IU]
  Filled 2020-06-12: qty 5

## 2020-06-12 NOTE — Patient Instructions (Signed)
[facility] Discharge Instructions for Patients Receiving Chemotherapy  Today you received the following chemotherapy agents: Rituxumab.  To help prevent nausea and vomiting after your treatment, we encourage you to take your nausea medication as directed.   If you develop nausea and vomiting that is not controlled by your nausea medication, call the clinic.   BELOW ARE SYMPTOMS THAT SHOULD BE REPORTED IMMEDIATELY:  *FEVER GREATER THAN 100.5 F  *CHILLS WITH OR WITHOUT FEVER  NAUSEA AND VOMITING THAT IS NOT CONTROLLED WITH YOUR NAUSEA MEDICATION  *UNUSUAL SHORTNESS OF BREATH  *UNUSUAL BRUISING OR BLEEDING  TENDERNESS IN MOUTH AND THROAT WITH OR WITHOUT PRESENCE OF ULCERS  *URINARY PROBLEMS  *BOWEL PROBLEMS  UNUSUAL RASH Items with * indicate a potential emergency and should be followed up as soon as possible.  Feel free to call the clinic should you have any questions or concerns. The clinic phone number is [phone].  Please show the CHEMO ALERT CARD at check-in to the Emergency Department and triage nurse.   

## 2020-06-12 NOTE — Progress Notes (Signed)
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 4) left neck basal cell carcinoma- removed recently  Current treatment - Revlimid + Rituxan for follicular lymphoma  PreviousTreatment:  -Status post 6 cycles of R CHOP (dose reductions for cycle 2-3)  -Cannot use G-CSF due to issues with severe G-CSF related ARDS after cycle 1 (requiring prolonged intubation and ventilatory support)   ONCOLOGIC HISTORY  Diffuse large B-cell lymphoma stage IV AE with involvement of mesenteric, inguinal lymph nodes and C5 vertebral body with spinal cord impingement   He had a C5 corpectomy on 07/26/2015 that showed diffuse large B-cell lymphoma with a Ki-67 ranging from 20 to 90%. Cytogenetics and Fish showed BCL 2 positivity but negative for BCL 6 and cMYC CD10 positivity suggestive of possibly GCB subtype.  Patient received first cycle of R CHOP on 08/08/2015 and intrathecal methotrexate with hydrocortisone on 08/09/2015. Patient subsequently was admitted with ARDS likely due to neulasta -treated with high dose steroids and empirically with broad spectrum antibiotics.  2nd cycle of chemotherapy delayed to allow for rehabilitation (was due on 08/31/2015).  Received R-mini-CHOP for cycle 2 and tolerated it without significant cytopenias. Was admitted briefly for a mild lower extremity cellulitis and DVT. Cellulitis now resolved. Patient completing his oral antibiotics.  PET/CT scan on 09/29/2015 with no evidence of disease progression. Good response in his mesenteric lymph nodes. Inguinal lymph  nodes on the right side. Decreased uptake in his C5 level.  No new lesions noted.   Cycle 3 (10/02/2015)  of R- CHOP ( we increased the doxorubicin dose back to 50 mg/m and keep the cyclophosphamide dose reduction at 400 mg/m]  Cycle 4 (10/23/2015) of R-CHOP ( we increased the doxorubicin dose back to 50 mg/m and kept the cyclophosphamide dose reduction at 400 mg/m].  PET/CT 11/10/2015: Shows improvement in most lesions but is noted to have a new FDG avid lymph node in the mesentery of the small bowel.  Cycle 5 on 11/13/2015 R CHOP - received full dose.  Cycle 6 on 12/11/2015 R-CHOP full dose.  Admitted with neutropenic fevers and treatment for possible early pneumonia.  This resolved and he was able to discharge home  01/09/2016 through 01/22/2016: The patient was treated to the C4-C6 levels of the spine to a dose of 30 gray in 10 fractions using a 2 field technique. The patient was also treated to an abdominal lymph node region to a dose of 30 gray in 10 fractions using a 3 field technique. This treatment consisted of a 3-D conformal technique with daily image guidance utilized.   Repeat PET CT scan in March 12 2016 -showed some enlarged mesenteric lymph nodes which were FDG avid.  CT guided FNA of mesenteric lymph nodes at [facility] on 03/29/2016 - showed scant cellularity. No overt evidence of lymphoma.  Rpt PET/CT 05/15/2016 at [facility]- show similar appearing enlarged mesenteric lymph nodes which are FDG avid. No change in size and no other evidence of disease progression.  PET/CT 07/24/2016 - and [facility] shows no evidence of disease progression  PET/CT 01/22/2017:  Result Impression   1.Stable low-level metabolic activity in small lymph nodes in the small bowel mesentery. No new FDG avid disease. 2.Persistent hypermetabolic soft tissue thickening overlying the coccyx which could relate to a decubitus ulcer and should be amenable to  direct inspection. 3.Stable pulmonary nodules which do not appear hypermetabolic, however, several are below the size resolution of PET in size. Continued attention on follow-up is suggested. 4.Previously seen hypermetabolic skin thickening near the right ear has resolved. 5.Ancillary CT findings as above.    INTERVAL HISTORY:  Ethan Stewart is here for management and evaluation of his recently diagnosed Follicular Lymphoma, grade 1-2. Pt is here for C19 of Rituxan. The patient's last visit with [place] was on 04/12/2020. The pt reports that he is doing well overall.  The pt reports that he discontinued Revlimid a month ago and has felt well in the interim. Pt has a double port that was placed at [facility] when there was consideration for transplant. He has been putting in an effort to stay active.    Of note since the patient's last visit, pt has had CT C/A/P ([id number]) completed on 05/31/2020 with results revealing "1. No lymphadenopathy in the chest, abdomen, or pelvis. No signs of disease recurrence. 2. Stable appearance of small RIGHT pulmonary nodules. 3. Aortic atherosclerosis."  Lab results today (06/12/20) of CBC w/diff and CMP is as follows: all values are WNL except for WBC at 3.3K, PLT at 103K, Total Protein at 6.1. 06/12/2020 LDH at 217  On review of systems, pt denies fevers, chills, night sweats, pain at port site, abdominal pain, and any other symptoms.    REVIEW OF SYSTEMS:   A 10+ POINT REVIEW OF SYSTEMS WAS OBTAINED including neurology, dermatology, psychiatry, cardiac, respiratory, lymph, extremities, GI, GU, Musculoskeletal, constitutional, breasts, reproductive, HEENT.  All pertinent positives are noted in the HPI.  All others are negative.   ALLERGIES:  is allergic to neulasta [pegfilgrastim].  MEDICATIONS:  Current Outpatient Medications  Medication Sig Dispense Refill  . diazepam (VALIUM) 2 MG tablet Take 2 mg by mouth daily as needed for anxiety.    .  ergocalciferol (VITAMIN D2) 1.25 MG (50000 UT) capsule Take 1 capsule (50,000 Units total) by mouth once a week. 12 capsule 4  . HYDROcodone-acetaminophen (NORCO) 5-325 MG tablet Take 1-2 tablets by mouth every 6 (six) hours as needed for moderate pain or severe pain. (Patient not taking: Reported on 01/17/2020) 20 tablet 0  . lenalidomide (REVLIMID) 15 MG capsule Take 1 capsule (15 mg total) by mouth daily. 3 weeks on 1 week off. 21 capsule 0  . levothyroxine (SYNTHROID, LEVOTHROID) 88 MCG tablet Take 88 mcg by mouth daily before breakfast.   2  . lidocaine-prilocaine (EMLA) cream Apply 1 application topically as needed (for port access).    . Multiple Vitamin (MULTIVITAMIN WITH MINERALS) TABS tablet Take 1 tablet by mouth daily.     . ondansetron (ZOFRAN) 8 MG tablet Take 1 tablet (8 mg total) by mouth every 8 (eight) hours as needed for nausea or vomiting. (Patient not taking: Reported on 01/17/2020) 30 tablet 1  . polyethylene glycol (MIRALAX / GLYCOLAX) 17 g packet Take 17 g by mouth daily. Takes with revlimid    . prochlorperazine (COMPAZINE) 10 MG tablet Take 1 tablet (10 mg total) by mouth every 8 (eight) hours as needed for nausea or vomiting. 30 tablet 0  . rivaroxaban (XARELTO) 10 MG TABS tablet Take 1 tablet (10 mg total) by mouth daily.  30 tablet 4  . senna (SENOKOT) 8.6 MG TABS tablet Take 2 tablets by mouth at bedtime.    . simvastatin (ZOCOR) 20 MG tablet Take 20 mg by mouth daily.    . tamsulosin (FLOMAX) 0.4 MG CAPS capsule Take 1 capsule (0.4 mg total) by mouth at bedtime. 30 capsule 3  . temazepam (RESTORIL) 15 MG capsule Take 15 mg by mouth at bedtime as needed for sleep.     . Vitamins/Minerals TABS Take by mouth.     No current facility-administered medications for this visit.   Facility-Administered Medications Ordered in Other Visits  Medication Dose Route Frequency Provider Last Rate Last Admin  . heparin lock flush 100 unit/mL  500 Units Intracatheter Once PRN Brunetta Genera, MD      . sodium chloride flush (NS) 0.9 % injection 10 mL  10 mL Intracatheter PRN Brunetta Genera, MD        PHYSICAL EXAMINATION: ECOG FS:1 - Symptomatic but completely ambulatory  There were no vitals filed for this visit. Wt Readings from Last 3 Encounters:  04/12/20 226 lb 1.6 oz (102.6 kg)  03/15/20 227 lb 1.6 oz (103 kg)  02/16/20 229 lb 12.8 oz (104.2 kg)   There is no height or weight on file to calculate BMI.    Exam was given in a chair   GENERAL:alert, in no acute distress and comfortable SKIN: no acute rashes, no significant lesions EYES: conjunctiva are pink and non-injected, sclera anicteric OROPHARYNX: MMM, no exudates, no oropharyngeal erythema or ulceration NECK: supple, no JVD LYMPH:  no palpable lymphadenopathy in the cervical, axillary or inguinal regions LUNGS: clear to auscultation b/l with normal respiratory effort HEART: regular rate & rhythm ABDOMEN:  normoactive bowel sounds , non tender, not distended. No palpable hepatosplenomegaly.  Extremity: no pedal edema PSYCH: alert & oriented x 3 with fluent speech NEURO: no focal motor/sensory deficits   LABORATORY DATA:   CBC Latest Ref Rng & Units 06/12/2020 05/30/2020 04/12/2020  WBC 4.0 - 10.5 K/uL 3.3(L) 3.6(L) 2.9(L)  Hemoglobin 13.0 - 17.0 g/dL 14.3 14.2 13.7  Hematocrit 39 - 52 % 41.0 40.5 40.1  Platelets 150 - 400 K/uL 103(L) 123(L) 125(L)     CMP Latest Ref Rng & Units 06/12/2020 05/30/2020 04/12/2020  Glucose 70 - 99 mg/dL 85 107(H) 86  BUN 8 - 23 mg/dL _0 Creatinine 0.61 - 1.24 mg/dL 1.00 1.13 0.94  Sodium 135 - 145 mmol/L 143 143 143  Potassium 3.5 - 5.1 mmol/L 4.3 4.1 4.4  Chloride 98 - 111 mmol/L 109 109 110  CO2 22 - 32 mmol/L _1 Calcium 8.9 - 10.3 mg/dL 9.1 9.1 8.6(L)  Total Protein 6.5 - 8.1 g/dL 6.1(L) 6.3(L) 6.0(L)  Total Bilirubin 0.3 - 1.2 mg/dL 0.6 0.8 0.6  Alkaline Phos 38 - 126 U/L 108 118 106  AST 15 - 41 U/L 28 30 38  ALT 0 - 44 U/L 29 30  45(H)   . Lab Results  Component Value Date   LDH 217 (H) 06/12/2020    Fine Needle Aspiration, Lymph NodeResulted: 12/04/2018 1:14 PM [facility] Result Narrative  ACCESSION NUMBER: [id number] RECEIVED: 11/27/2018 ORDERING PHYSICIAN: RAKHEE Megan Mans , MD PATIENT NAME: Ethan Stewart * Amended * Final Cytologic Interpretation AMENDED Stewart  AMENDED 12/04/2018 TO ADD DIAGNOSIS   A. Right inguinal lymph node, Fine Needle Aspiration II (smears and cell block): B-cell lymphoma with germinal center phenotype (  see comment)  Specimen Adequacy: Satisfactory for evaluation.  B. Cytology core biopsy: B-cell lymphoma with germinal center phenotype (see comment)  Specimen Adequacy: Satisfactory for evaluation.    COMMENT:Sections of the core biopsy demonstrate an atypical vaguely nodular lymphoid infiltrate composed of medium sized lymphoid cells with mature chromatin, irregular nuclear membranes and scant cytoplasm. Focally some areas show larger cells. Few mitotic figures are present. Appropriately controlled immunohistochemical stains are performed. Sheets of B-cells are highlighted by CD20. These B-cells co-express BCL2 and BCL6 (focal, nodular distribution). They are negative for CD30 and MYC. CD21 demonstrates few residual disrupted follicular dendritic meshworks. The proliferation index is low (10-20%), as demonstrated by Ki-67. Scattered small T-cells are highlighted by CD3. Concurrent flow cytometry identified a kappa light chain restricted B-cell population, co-expressing CD10.  Overall, the findings are consistent with involvement by a B-cell lymphoma with a germinal center phenotype (GC-type), with focal follicular architecture and a low proliferation index. Given the nature of the sample (i.e. core biopsy) a final architectural subclassification is not possible. If clinically feasible,  an excisional biopsy is recommended.  Correlation with clinical and cytogenetic/molecular data is recommended.    12/11/18 Inguinal LN Biopsy  Interpretation Tissue-Flow Cytometry - MONOCLONAL B-CELL POPULATION EXPRESSING CD10 COMPRISES 92% OF ALL LYMPHOCYTES - SEE COMMENT Microscopic Gated population: Flow cytometric immunophenotyping is performed using antibiodies to the antigens listed in the table below. Electronic gates are placed around a cell cluster displaying light scatter properties corresponding to lymphocytes. - Abnormal Cells in gated population: 92 % - Phenotype of Abnormal Cells: CD10, CD19, CD20, CD21, CD22, HLA-Dr, Kappa Diagnosis Lymph node for lymphoma, Deep Right Inguinal - FOLLICULAR LYMPHOMA, GRADE 1-2 - SEE COMMENT Microscopic Comment The nodal architecture is effaced by a proliferation back to back follicles composed of medium to large lymphocytes with irregular, cleaved nuclei and scattered centroblasts. There are no diffuse areas of growth present within the examined nodal material. Immunohistochemistry performed on the tissue highlights a nodular B-cell proliferation with expression of CD20, CD10, bcl-6, and Bcl-2. CD21 highlights expanded follicular dendritic meshworks. The Ki-67 shows a slightly increased proliferation rate of 30-40%. The atypical lymphocytes are negative for CD5. CD3 immunohistochemistry highlights the background T-cells. Flow cytometry was performed. A monoclonal B-cell populaton expressing CD10 comprises 92% of all lymphocytes (See [phone]). Overall, the findings are consistent with a diagnosis of low grade follicular lymphoma, Grade 1-2. Of note, the increased ki-67 may portend a more aggressive course despite the low histologic grade.   01/18/19 BM Biopsy:   05/05/19 BM Biopsy:   10/22/202 CT CHEST, ABDOMEN, AND PELVIS WITH CONTRAST (Accession [id number])    RADIOGRAPHIC STUDIES: I have personally reviewed the  radiological images as listed and agreed with the findings in the Stewart. No results found.    ASSESSMENT & PLAN:   Ethan Stewart is a 74 y.o. male with:  1) Diffuse large B-cell lymphoma stage IV AE with involvement of mesenteric, inguinal lymph nodes and C5 vertebral body with spinal cord impingement  Patient is status post six cycles of R-CHOP and R-mini-CHOP, completed in 2016, as noted above.  2) History of capillary leak syndrome with ARDS requiring intubation likely due to G-CSF.  3)  Follicular Lymphoma, Grade 1-2 currently on Revlimid + Rituxan  11/02/18 CT A/P which revealed Negative for inguinal or bowel hernia but positive for bulky right inguinal and external iliac lymphadenopathy which is new since a 2017 PET-CT and highly suspicious for recurrent Lymphoma. This would be amenable to Ultrasound-guided needle  biopsy. 2. No abdominal or contralateral left hemi pelvic lymphadenopathy. No other acute findings identified in the abdomen or pelvis. 3. Chronic pneumobilia.  Aortic Atherosclerosis.  11/12/18 PET/CT which revealed  Interval development of multistation lymphadenopathy in the abdomen and pelvis as detailed above- Deauville 5. 2. Presence of pneumobilia could reflect sphincter of Oddi dysfunction versus recent procedure. Correlate with clinical history and upcoming comprehensive metabolic panel. 3. Ancillary CT findings   11/27/18 US guided LN Biopsy pathology Stewart is as noted above. " B-cell lymphoma with a germinal center phenotype (GC-type), with focal follicular architecture and a low proliferation index. Given the nature of the sample (i.e. core biopsy) a final architectural subclassification is not possible. If clinically feasible, an excisional biopsy is recommended."  12/11/18 Right Inguinal LN biopsy revealed Grade 1-2 Follicular Lymphoma  08/23/78 CT A/P revealed Today's study demonstrates clear progression of disease with increased number and size of numerous  enlarged lymph nodes throughout the lower thorax, abdomen and pelvis, as detailed above. 2. In addition, there are several small pulmonary nodules in the lung bases. The largest of these are clustered in the posterior aspect of the right lower lobe in an apparent area of scarring which is very similar to prior PET-CT 03/12/2016, favored to be benign. However, the smaller nodules which measure up to 6 mm in size are nonspecific and warrant attention on follow-up studies. 3. Aortic atherosclerosis, in addition to least 2 vessel coronary artery disease. Assessment for potential risk factor modification, dietary therapy or pharmacologic therapy may be warranted, if clinically indicated. 4. Additional incidental findings, as above. 01/18/19 BM Bx revealed normocellular bone marrow with trilineage hematopoiesis Stage II indicated with 01/26/19 CT  Began C1 of 14m Revlimid and Rituxan on 02/05/19  05/05/19 BM Bx revealed normocellular bone marrow with erythroid hyperplasia, and no evidence of lymphoma  05/11/19 PET/CT revealed "Near complete metabolic response to therapy of lymphoma since the prior PET of 11/12/2018. Low-level hypermetabolism at the site of adenopathy within the abdominal retroperitoneum persists. (Deauville) 2. There has also been resolution of abdominopelvic adenopathy since the diagnostic CT of 01/26/2019. 2. Nonspecific right lower lobe pulmonary nodules, similar. 3. Coronary artery atherosclerosis. Aortic Atherosclerosis."   10/14/2019 CT C/A/P ([id number] revealed " 1. No findings of active lymphoma. 2. Pulmonary nodules in the right lung are mostly stable from March  2017, and considered benign, although one 5 mm right lower lobe nodule medially on image 115/6 is stable of the last year but not well seen on 03/12/2016 and may merit surveillance. 3. Reduced prominence of treatment related stranding in the left periaortic region likely the residua of prior adenopathy. 4. Other imaging findings  of potential clinical significance:Coronary atherosclerosis. Mild cardiomegaly. Cholelithiasis. Mild prostatomegaly. Mild lumbar spondylosis and degenerative disc disease causing mild impingement at L3-4 and L4-5. Small left renal peripelvic cysts. Prior bilateral groin hernia repairs. Pneumobilia, query prior sphincterotomy."  4) History of right lower extremity distal DVT. Resolved. S/p 6 months of treatment in 02/2016 -back on 155mXarelto while taking Revlimid  5) Mild chronic thrombocytopenia-  128k   PLAN: -Discussed pt labwork today, 06/12/20; WBC and PLT are low, other blood counts and chemistries are stable, LDH is borderline high -Discussed 05/31/2020 CT C/A/P ([id number] revealed "1. No lymphadenopathy in the chest, abdomen, or pelvis. No signs of disease recurrence. 2. Stable appearance of small RIGHT pulmonary nodules. 3. Aortic atherosclerosis." -Will continue monthly maintenance Rituxan to increase time to progression.  -The pt has no prohibitive toxicities from  continuing maintenance Rituxan at this time.  -The pt shows no lab or clinical evidence of his Large B-cell Lymphoma recurrence at this time.  -The pt shows no lab or clinical evidence of his Follicular Lymphoma progression at this time. -Advised pt that WBC may normalize after being off of Revlimid.  -Advised pt that when PLT >80K they behave normally.  -Discussed the risk of rpt blood clots and the frequency of port flushes -Will remove Port-a-cath in 2 weeks  -Will see back in 2 months with labs    FOLLOW UP: Plz schedule next cycle of maintenance Rituxan, labs and MD visit in 2 months Port a cath removal in 2 weeks by IR       The total time spent in the appt was 30 minutes and more than 50% was on counseling and direct patient cares, discussing PET results, ordering and management of Rituxan.  All of the patient's questions were answered with apparent satisfaction. The patient knows to call the clinic  with any problems, questions or concerns.   Sullivan Lone MD [place] [facility]  (Office):       [phone] (Work cell):  [phone] (Fax):           [phone]   I, Yevette Edwards, am acting as a scribe for Dr. Sullivan Lone.   .I have reviewed the above documentation for accuracy and completeness, and I agree with the above. Brunetta Genera MD

## 2020-06-20 ENCOUNTER — Telehealth: Payer: Self-pay | Admitting: Hematology

## 2020-06-20 NOTE — Telephone Encounter (Signed)
Scheduled per 06/21 los, patient has been called and voicemail was left.

## 2020-06-27 ENCOUNTER — Other Ambulatory Visit: Payer: Self-pay | Admitting: Physician Assistant

## 2020-06-28 ENCOUNTER — Other Ambulatory Visit: Payer: Self-pay

## 2020-06-28 ENCOUNTER — Ambulatory Visit (HOSPITAL_COMMUNITY)
Admission: RE | Admit: 2020-06-28 | Discharge: 2020-06-28 | Disposition: A | Discharge status: Home / Self Care | Payer: No Typology Code available for payment source | Source: Ambulatory Visit | Attending: Hematology | Admitting: Hematology

## 2020-06-28 ENCOUNTER — Encounter (HOSPITAL_COMMUNITY): Payer: Self-pay

## 2020-06-28 DIAGNOSIS — C8298 Follicular lymphoma, unspecified, lymph nodes of multiple sites: Secondary | ICD-10-CM | POA: Insufficient documentation

## 2020-06-28 DIAGNOSIS — Z452 Encounter for adjustment and management of vascular access device: Secondary | ICD-10-CM | POA: Diagnosis present

## 2020-06-28 HISTORY — PX: IR REMOVAL TUN ACCESS W/ PORT W/O FL MOD SED: IMG2290

## 2020-06-28 LAB — PROTIME-INR
INR: 1 (ref 0.8–1.2)
Prothrombin Time: 12.8 seconds (ref 11.4–15.2)

## 2020-06-28 LAB — CBC
HCT: 42.9 % (ref 39.0–52.0)
Hemoglobin: 14.7 g/dL (ref 13.0–17.0)
MCH: 31.7 pg (ref 26.0–34.0)
MCHC: 34.3 g/dL (ref 30.0–36.0)
MCV: 92.7 fL (ref 80.0–100.0)
Platelets: 121 10*3/uL — ABNORMAL LOW (ref 150–400)
RBC: 4.63 MIL/uL (ref 4.22–5.81)
RDW: 14 % (ref 11.5–15.5)
WBC: 3.1 10*3/uL — ABNORMAL LOW (ref 4.0–10.5)
nRBC: 0 % (ref 0.0–0.2)

## 2020-06-28 MED ORDER — LIDOCAINE-EPINEPHRINE 1 %-1:100000 IJ SOLN
INTRAMUSCULAR | Status: DC | PRN
  Administered 2020-06-28: 10 mL

## 2020-06-28 MED ORDER — CEFAZOLIN SODIUM-DEXTROSE 2-4 GM/100ML-% IV SOLN
INTRAVENOUS | Status: AC
  Administered 2020-06-28: 2 g via INTRAVENOUS
  Filled 2020-06-28: qty 100

## 2020-06-28 MED ORDER — CEFAZOLIN SODIUM-DEXTROSE 2-4 GM/100ML-% IV SOLN: 2.0000 g | Freq: Once | INTRAVENOUS | Status: AC

## 2020-06-28 MED ORDER — LIDOCAINE-EPINEPHRINE 1 %-1:100000 IJ SOLN
INTRAMUSCULAR | Status: AC
  Filled 2020-06-28: qty 1

## 2020-06-28 MED ORDER — SODIUM CHLORIDE 0.9 % IV SOLN: INTRAVENOUS | Status: DC

## 2020-06-28 NOTE — Procedures (Addendum)
  Procedure: Remove R chest DL port   EBL:   minimal Complications:  none immediate  See full dictation in [employer].  Dillard Cannon MD Main # [phone] Pager  [phone]

## 2020-06-28 NOTE — Discharge Instructions (Signed)
Please call Interventional Radiology clinic [phone] with any questions or concerns.  You may remove your dressing and shower tomorrow.   Implanted Port Removal, Care After This sheet gives you information about how to care for yourself after your procedure. Your health care provider may also give you more specific instructions. If you have problems or questions, contact your health care provider. What can I expect after the procedure? After the procedure, it is common to have:  Soreness or pain near your incision.  Some swelling or bruising near your incision. Follow these instructions at home: Medicines  Take over-the-counter and prescription medicines only as told by your health care provider.  If you were prescribed an antibiotic medicine, take it as told by your health care provider. Do not stop taking the antibiotic even if you start to feel better. Bathing  Do not take baths, swim, or use a hot tub until your health care provider approves. Ask your health care provider if you can take showers. You may only be allowed to take sponge baths. Incision care   Follow instructions from your health care provider about how to take care of your incision. Make sure you: ? Wash your hands with soap and water before you change your bandage (dressing). If soap and water are not available, use hand sanitizer. ? Change your dressing as told by your health care provider. ? Keep your dressing dry. ? Leave stitches (sutures), skin glue, or adhesive strips in place. These skin closures may need to stay in place for 2 weeks or longer. If adhesive strip edges start to loosen and curl up, you may trim the loose edges. Do not remove adhesive strips completely unless your health care provider tells you to do that.  Check your incision area every day for signs of infection. Check for: ? More redness, swelling, or pain. ? More fluid or blood. ? Warmth. ? Pus or a bad smell. Driving   Do not  drive for 24 hours if you were given a medicine to help you relax (sedative) during your procedure.  If you did not receive a sedative, ask your health care provider when it is safe to drive. Activity  Return to your normal activities as told by your health care provider. Ask your health care provider what activities are safe for you.  Do not lift anything that is heavier than 10 lb (4.5 kg), or the limit that you are told, until your health care provider says that it is safe.  Do not do activities that involve lifting your arms over your head. General instructions  Do not use any products that contain nicotine or tobacco, such as cigarettes and e-cigarettes. These can delay healing. If you need help quitting, ask your health care provider.  Keep all follow-up visits as told by your health care provider. This is important. Contact a health care provider if:  You have more redness, swelling, or pain around your incision.  You have more fluid or blood coming from your incision.  Your incision feels warm to the touch.  You have pus or a bad smell coming from your incision.  You have pain that is not relieved by your pain medicine. Get help right away if you have:  A fever or chills.  Chest pain.  Difficulty breathing. Summary  After the procedure, it is common to have pain, soreness, swelling, or bruising near your incision.  If you were prescribed an antibiotic medicine, take it as told by your health   care provider. Do not stop taking the antibiotic even if you start to feel better.  Do not drive for 24 hours if you were given a sedative during your procedure.  Return to your normal activities as told by your health care provider. Ask your health care provider what activities are safe for you. This information is not intended to replace advice given to you by your health care provider. Make sure you discuss any questions you have with your health care provider. Document  Revised: 01/22/2018 Document Reviewed: 01/22/2018 Elsevier Patient Education  2020 [employer]  

## 2020-06-28 NOTE — Progress Notes (Signed)
Patient ID: Ethan Stewart, male   DOB: 1946/03/24, 74 y.o.   MRN: [id number] Patient presents to IR dept  today for Port-A-Cath removal.  He has a history of diffuse large B-cell lymphoma/follicular lymphoma, currently in remission, with no disease recurrence on recent imaging.  Details/risks of procedure, including but not limited to, internal bleeding, infection, injury to adjacent structures discussed with patient with his understanding and consent.  He has elected not to receive IV conscious sedation for the procedure.

## 2020-06-29 DIAGNOSIS — R69 Illness, unspecified: Secondary | ICD-10-CM | POA: Diagnosis not present

## 2020-07-19 DIAGNOSIS — E7800 Pure hypercholesterolemia, unspecified: Secondary | ICD-10-CM | POA: Diagnosis not present

## 2020-07-19 DIAGNOSIS — E038 Other specified hypothyroidism: Secondary | ICD-10-CM | POA: Diagnosis not present

## 2020-07-19 DIAGNOSIS — N400 Enlarged prostate without lower urinary tract symptoms: Secondary | ICD-10-CM | POA: Diagnosis not present

## 2020-07-24 DIAGNOSIS — E038 Other specified hypothyroidism: Secondary | ICD-10-CM | POA: Diagnosis not present

## 2020-07-24 DIAGNOSIS — N400 Enlarged prostate without lower urinary tract symptoms: Secondary | ICD-10-CM | POA: Diagnosis not present

## 2020-07-24 DIAGNOSIS — E7800 Pure hypercholesterolemia, unspecified: Secondary | ICD-10-CM | POA: Diagnosis not present

## 2020-08-14 ENCOUNTER — Inpatient Hospital Stay: Payer: No Typology Code available for payment source

## 2020-08-14 ENCOUNTER — Inpatient Hospital Stay (HOSPITAL_BASED_OUTPATIENT_CLINIC_OR_DEPARTMENT_OTHER): Payer: No Typology Code available for payment source | Admitting: Hematology

## 2020-08-14 ENCOUNTER — Other Ambulatory Visit: Payer: Self-pay

## 2020-08-14 ENCOUNTER — Inpatient Hospital Stay: Payer: No Typology Code available for payment source | Attending: Hematology

## 2020-08-14 VITALS — BP 140/67 | HR 61 | Resp 18

## 2020-08-14 VITALS — BP 144/80 | HR 53 | Temp 96.5°F | Resp 18 | Ht 73.0 in | Wt 221.5 lb

## 2020-08-14 DIAGNOSIS — Z79899 Other long term (current) drug therapy: Secondary | ICD-10-CM | POA: Insufficient documentation

## 2020-08-14 DIAGNOSIS — Z298 Encounter for other specified prophylactic measures: Secondary | ICD-10-CM | POA: Diagnosis not present

## 2020-08-14 DIAGNOSIS — C8298 Follicular lymphoma, unspecified, lymph nodes of multiple sites: Secondary | ICD-10-CM | POA: Insufficient documentation

## 2020-08-14 DIAGNOSIS — C8330 Diffuse large B-cell lymphoma, unspecified site: Secondary | ICD-10-CM

## 2020-08-14 DIAGNOSIS — Z5112 Encounter for antineoplastic immunotherapy: Secondary | ICD-10-CM | POA: Diagnosis not present

## 2020-08-14 LAB — CBC WITH DIFFERENTIAL/PLATELET
Abs Immature Granulocytes: 0 10*3/uL (ref 0.00–0.07)
Basophils Absolute: 0 10*3/uL (ref 0.0–0.1)
Basophils Relative: 1 %
Eosinophils Absolute: 0.1 10*3/uL (ref 0.0–0.5)
Eosinophils Relative: 1 %
HCT: 41.4 % (ref 39.0–52.0)
Hemoglobin: 14.4 g/dL (ref 13.0–17.0)
Immature Granulocytes: 0 %
Lymphocytes Relative: 24 %
Lymphs Abs: 0.9 10*3/uL (ref 0.7–4.0)
MCH: 31 pg (ref 26.0–34.0)
MCHC: 34.8 g/dL (ref 30.0–36.0)
MCV: 89 fL (ref 80.0–100.0)
Monocytes Absolute: 0.5 10*3/uL (ref 0.1–1.0)
Monocytes Relative: 14 %
Neutro Abs: 2.2 10*3/uL (ref 1.7–7.7)
Neutrophils Relative %: 60 %
Platelets: 110 10*3/uL — ABNORMAL LOW (ref 150–400)
RBC: 4.65 MIL/uL (ref 4.22–5.81)
RDW: 13.2 % (ref 11.5–15.5)
WBC: 3.7 10*3/uL — ABNORMAL LOW (ref 4.0–10.5)
nRBC: 0 % (ref 0.0–0.2)

## 2020-08-14 LAB — CMP (CANCER CENTER ONLY)
ALT: 21 U/L (ref 0–44)
AST: 22 U/L (ref 15–41)
Albumin: 3.9 g/dL (ref 3.5–5.0)
Alkaline Phosphatase: 94 U/L (ref 38–126)
Anion gap: 8 (ref 5–15)
BUN: 17 mg/dL (ref 8–23)
CO2: 23 mmol/L (ref 22–32)
Calcium: 9.5 mg/dL (ref 8.9–10.3)
Chloride: 109 mmol/L (ref 98–111)
Creatinine: 1.06 mg/dL (ref 0.61–1.24)
GFR, Est AFR Am: >60 mL/min (ref >60)
GFR, Estimated: >60 mL/min (ref >60)
Glucose, Bld: 103 mg/dL — ABNORMAL HIGH (ref 70–99)
Potassium: 4.4 mmol/L (ref 3.5–5.1)
Sodium: 140 mmol/L (ref 135–145)
Total Bilirubin: 0.7 mg/dL (ref 0.3–1.2)
Total Protein: 6.1 g/dL — ABNORMAL LOW (ref 6.5–8.1)

## 2020-08-14 MED ORDER — ACETAMINOPHEN 325 MG PO TABS
650.0000 mg | ORAL_TABLET | Freq: Once | ORAL | Status: AC
  Administered 2020-08-14: 650 mg via ORAL

## 2020-08-14 MED ORDER — DIPHENHYDRAMINE HCL 25 MG PO CAPS
50.0000 mg | ORAL_CAPSULE | Freq: Once | ORAL | Status: AC
  Administered 2020-08-14: 50 mg via ORAL

## 2020-08-14 MED ORDER — METHYLPREDNISOLONE SODIUM SUCC 125 MG IJ SOLR
INTRAMUSCULAR | Status: AC
  Filled 2020-08-14: qty 2

## 2020-08-14 MED ORDER — DIPHENHYDRAMINE HCL 25 MG PO CAPS
ORAL_CAPSULE | ORAL | Status: AC
  Filled 2020-08-14: qty 2

## 2020-08-14 MED ORDER — ACETAMINOPHEN 325 MG PO TABS
ORAL_TABLET | ORAL | Status: AC
  Filled 2020-08-14: qty 2

## 2020-08-14 MED ORDER — METHYLPREDNISOLONE SODIUM SUCC 125 MG IJ SOLR
125.0000 mg | Freq: Once | INTRAMUSCULAR | Status: AC
  Administered 2020-08-14: 125 mg via INTRAVENOUS

## 2020-08-14 MED ORDER — SODIUM CHLORIDE 0.9 % IV SOLN
Freq: Once | INTRAVENOUS | Status: AC
  Filled 2020-08-14: qty 250

## 2020-08-14 MED ORDER — SODIUM CHLORIDE 0.9 % IV SOLN
375.0000 mg/m2 | Freq: Once | INTRAVENOUS | Status: AC
  Administered 2020-08-14: 900 mg via INTRAVENOUS
  Filled 2020-08-14: qty 50

## 2020-08-14 NOTE — Progress Notes (Signed)
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 4) left neck basal cell carcinoma- removed recently  Current treatment - Revlimid + Rituxan for follicular lymphoma  PreviousTreatment:  -Status post 6 cycles of R CHOP (dose reductions for cycle 2-3)  -Cannot use G-CSF due to issues with severe G-CSF related ARDS after cycle 1 (requiring prolonged intubation and ventilatory support)   ONCOLOGIC HISTORY  Diffuse large B-cell lymphoma stage IV AE with involvement of mesenteric, inguinal lymph nodes and C5 vertebral body with spinal cord impingement   He had a C5 corpectomy on 07/26/2015 that showed diffuse large B-cell lymphoma with a Ki-67 ranging from 20 to 90%. Cytogenetics and Fish showed BCL 2 positivity but negative for BCL 6 and cMYC CD10 positivity suggestive of possibly GCB subtype.  Patient received first cycle of R CHOP on 08/08/2015 and intrathecal methotrexate with hydrocortisone on 08/09/2015. Patient subsequently was admitted with ARDS likely due to neulasta -treated with high dose steroids and empirically with broad spectrum antibiotics.  2nd cycle of chemotherapy delayed to allow for rehabilitation (was due on 08/31/2015).  Received R-mini-CHOP for cycle 2 and tolerated it without significant cytopenias. Was admitted briefly for a mild lower extremity cellulitis and DVT. Cellulitis now resolved. Patient completing his oral antibiotics.  PET/CT scan on 09/29/2015 with no evidence of disease progression. Good response in his mesenteric lymph nodes. Inguinal lymph  nodes on the right side. Decreased uptake in his C5 level.  No new lesions noted.   Cycle 3 (10/02/2015)  of R- CHOP ( we increased the doxorubicin dose back to 50 mg/m and keep the cyclophosphamide dose reduction at 400 mg/m]  Cycle 4 (10/23/2015) of R-CHOP ( we increased the doxorubicin dose back to 50 mg/m and kept the cyclophosphamide dose reduction at 400 mg/m].  PET/CT 11/10/2015: Shows improvement in most lesions but is noted to have a new FDG avid lymph node in the mesentery of the small bowel.  Cycle 5 on 11/13/2015 R CHOP - received full dose.  Cycle 6 on 12/11/2015 R-CHOP full dose.  Admitted with neutropenic fevers and treatment for possible early pneumonia.  This resolved and he was able to discharge home  01/09/2016 through 01/22/2016: The patient was treated to the C4-C6 levels of the spine to a dose of 30 gray in 10 fractions using a 2 field technique. The patient was also treated to an abdominal lymph node region to a dose of 30 gray in 10 fractions using a 3 field technique. This treatment consisted of a 3-D conformal technique with daily image guidance utilized.   Repeat PET CT scan in March 12 2016 -showed some enlarged mesenteric lymph nodes which were FDG avid.  CT guided FNA of mesenteric lymph nodes at [facility] on 03/29/2016 - showed scant cellularity. No overt evidence of lymphoma.  Rpt PET/CT 05/15/2016 at [facility]- show similar appearing enlarged mesenteric lymph nodes which are FDG avid. No change in size and no other evidence of disease progression.  PET/CT 07/24/2016 - and [facility] shows no evidence of disease progression  PET/CT 01/22/2017:  Result Impression   1.Stable low-level metabolic activity in small lymph nodes in the small bowel mesentery. No new FDG avid disease. 2.Persistent hypermetabolic soft tissue thickening overlying the coccyx which could relate to a decubitus ulcer and should be amenable to  direct inspection. 3.Stable pulmonary nodules which do not appear hypermetabolic, however, several are below the size resolution of PET in size. Continued attention on follow-up is suggested. 4.Previously seen hypermetabolic skin thickening near the right ear has resolved. 5.Ancillary CT findings as above.    INTERVAL HISTORY:  Ethan Stewart is here for management and evaluation of his recently diagnosed Follicular Lymphoma, grade 1-2. Pt is here for C20 of Rituxan. The patient's last visit with [place] was on 06/12/2020. The pt reports that he is doing well overall.  The pt reports that he has had more energy since being off of Revlimid. He denies any new symptoms or concerns.   Lab results today (08/14/20) of CBC w/diff and CMP is as follows: all values are WNL except for WBC at 3.7K, PLT at 110K, Glucose at 103, Total Protein at 6.1.  On review of systems, pt denies fatigue, fevers, chills, night sweats, new lumps/bumps, unexpected weight loss and any other symptoms.    REVIEW OF SYSTEMS:   A 10+ POINT REVIEW OF SYSTEMS WAS OBTAINED including neurology, dermatology, psychiatry, cardiac, respiratory, lymph, extremities, GI, GU, Musculoskeletal, constitutional, breasts, reproductive, HEENT.  All pertinent positives are noted in the HPI.  All others are negative.   ALLERGIES:  is allergic to neulasta [pegfilgrastim].  MEDICATIONS:  Current Outpatient Medications  Medication Sig Dispense Refill  . diazepam (VALIUM) 2 MG tablet Take 2 mg by mouth daily as needed for anxiety.    . ergocalciferol (VITAMIN D2) 1.25 MG (50000 UT) capsule Take 1 capsule (50,000 Units total) by mouth once a week. 12 capsule 4  . levothyroxine (SYNTHROID, LEVOTHROID) 88 MCG tablet Take 88 mcg by mouth daily before breakfast.   2  . lidocaine-prilocaine (EMLA) cream Apply 1 application topically as needed (for port access).    . Multiple Vitamin (MULTIVITAMIN WITH MINERALS) TABS tablet Take 1 tablet by mouth daily.      . polyethylene glycol (MIRALAX / GLYCOLAX) 17 g packet Take 17 g by mouth daily. Takes with revlimid    . rivaroxaban (XARELTO) 10 MG TABS tablet Take 1 tablet (10 mg total) by mouth daily. 30 tablet 4  . senna (SENOKOT) 8.6 MG TABS tablet Take 2 tablets by mouth at bedtime.    . simvastatin (ZOCOR) 20 MG tablet Take 20 mg by mouth daily.    . tamsulosin (FLOMAX) 0.4 MG CAPS capsule Take 1 capsule (0.4 mg total) by mouth at bedtime. 30 capsule 3  . temazepam (RESTORIL) 15 MG capsule Take 15 mg by mouth at bedtime as needed for sleep.     . Vitamins/Minerals TABS Take by mouth.    Ethan Stewart HYDROcodone-acetaminophen (NORCO) 5-325 MG tablet Take 1-2 tablets by mouth every 6 (six) hours as needed for moderate pain or severe pain. (Patient not taking: Reported on 01/17/2020) 20 tablet 0  . lenalidomide (REVLIMID) 15 MG capsule Take 1 capsule (15 mg total) by mouth daily. 3 weeks on 1 week off. (Patient not taking: Reported on 08/14/2020) 21 capsule 0  . ondansetron (ZOFRAN) 8 MG tablet Take 1 tablet (8 mg total) by mouth every 8 (eight) hours as needed for nausea or vomiting. (Patient not taking: Reported on 01/17/2020) 30 tablet 1  . prochlorperazine (COMPAZINE) 10 MG tablet  Take 1 tablet (10 mg total) by mouth every 8 (eight) hours as needed for nausea or vomiting. (Patient not taking: Reported on 08/14/2020) 30 tablet 0   No current facility-administered medications for this visit.    PHYSICAL EXAMINATION: ECOG FS:1 - Symptomatic but completely ambulatory  Vitals:   08/14/20 0940  BP: (!) 144/80  Pulse: (!) 53  Resp: 18  Temp: (!) 96.5 F (35.8 C)  SpO2: 100%   Wt Readings from Last 3 Encounters:  08/14/20 221 lb 8 oz (100.5 kg)  04/12/20 226 lb 1.6 oz (102.6 kg)  03/15/20 227 lb 1.6 oz (103 kg)   Body mass index is 29.22 kg/m.    GENERAL:alert, in no acute distress and comfortable SKIN: no acute rashes, no significant lesions EYES: conjunctiva are pink and non-injected, sclera  anicteric OROPHARYNX: MMM, no exudates, no oropharyngeal erythema or ulceration NECK: supple, no JVD LYMPH:  no palpable lymphadenopathy in the cervical, axillary or inguinal regions LUNGS: clear to auscultation b/l with normal respiratory effort HEART: regular rate & rhythm ABDOMEN:  normoactive bowel sounds , non tender, not distended. No palpable hepatosplenomegaly.  Extremity: no pedal edema PSYCH: alert & oriented x 3 with fluent speech NEURO: no focal motor/sensory deficits   LABORATORY DATA:   CBC Latest Ref Rng & Units 08/14/2020 06/28/2020 06/12/2020  WBC 4.0 - 10.5 K/uL 3.7(L) 3.1(L) 3.3(L)  Hemoglobin 13.0 - 17.0 g/dL 14.4 14.7 14.3  Hematocrit 39 - 52 % 41.4 42.9 41.0  Platelets 150 - 400 K/uL 110(L) 121(L) 103(L)     CMP Latest Ref Rng & Units 08/14/2020 06/12/2020 05/30/2020  Glucose 70 - 99 mg/dL 103(H) 85 107(H)  BUN 8 - 23 mg/dL _0 Creatinine 0.61 - 1.24 mg/dL 1.06 1.00 1.13  Sodium 135 - 145 mmol/L 140 143 143  Potassium 3.5 - 5.1 mmol/L 4.4 4.3 4.1  Chloride 98 - 111 mmol/L 109 109 109  CO2 22 - 32 mmol/L _1 Calcium 8.9 - 10.3 mg/dL 9.5 9.1 9.1  Total Protein 6.5 - 8.1 g/dL 6.1(L) 6.1(L) 6.3(L)  Total Bilirubin 0.3 - 1.2 mg/dL 0.7 0.6 0.8  Alkaline Phos 38 - 126 U/L 94 108 118  AST 15 - 41 U/L _2 ALT 0 - 44 U/L _3 . Lab Results  Component Value Date   LDH 217 (H) 06/12/2020    Fine Needle Aspiration, Lymph NodeResulted: 12/04/2018 1:14 PM [facility] Result Narrative  ACCESSION NUMBER: [id number] RECEIVED: 11/27/2018 ORDERING PHYSICIAN: RAKHEE Megan Mans , MD PATIENT NAME: Ethan Stewart CYTOLOGY REPORT * Amended * Final Cytologic Interpretation AMENDED REPORT  AMENDED 12/04/2018 TO ADD DIAGNOSIS   A. Right inguinal lymph node, Fine Needle Aspiration II (smears and cell block): B-cell lymphoma with germinal center phenotype (see comment)  Specimen Adequacy: Satisfactory for  evaluation.  B. Cytology core biopsy: B-cell lymphoma with germinal center phenotype (see comment)  Specimen Adequacy: Satisfactory for evaluation.    COMMENT:Sections of the core biopsy demonstrate an atypical vaguely nodular lymphoid infiltrate composed of medium sized lymphoid cells with mature chromatin, irregular nuclear membranes and scant cytoplasm. Focally some areas show larger cells. Few mitotic figures are present. Appropriately controlled immunohistochemical stains are performed. Sheets of B-cells are highlighted by CD20. These B-cells co-express BCL2 and BCL6 (focal, nodular distribution). They are negative for CD30 and MYC. CD21 demonstrates few residual disrupted follicular dendritic meshworks. The proliferation index is low (10-20%), as demonstrated by Ki-67.  Scattered small T-cells are highlighted by CD3. Concurrent flow cytometry identified a kappa light chain restricted B-cell population, co-expressing CD10.  Overall, the findings are consistent with involvement by a B-cell lymphoma with a germinal center phenotype (GC-type), with focal follicular architecture and a low proliferation index. Given the nature of the sample (i.e. core biopsy) a final architectural subclassification is not possible. If clinically feasible, an excisional biopsy is recommended.  Correlation with clinical and cytogenetic/molecular data is recommended.    12/11/18 Inguinal LN Biopsy  Interpretation Tissue-Flow Cytometry - MONOCLONAL B-CELL POPULATION EXPRESSING CD10 COMPRISES 92% OF ALL LYMPHOCYTES - SEE COMMENT Microscopic Gated population: Flow cytometric immunophenotyping is performed using antibiodies to the antigens listed in the table below. Electronic gates are placed around a cell cluster displaying light scatter properties corresponding to lymphocytes. - Abnormal Cells in gated population: 92 % - Phenotype of Abnormal Cells: CD10, CD19, CD20, CD21, CD22,  HLA-Dr, Kappa Diagnosis Lymph node for lymphoma, Deep Right Inguinal - FOLLICULAR LYMPHOMA, GRADE 1-2 - SEE COMMENT Microscopic Comment The nodal architecture is effaced by a proliferation back to back follicles composed of medium to large lymphocytes with irregular, cleaved nuclei and scattered centroblasts. There are no diffuse areas of growth present within the examined nodal material. Immunohistochemistry performed on the tissue highlights a nodular B-cell proliferation with expression of CD20, CD10, bcl-6, and Bcl-2. CD21 highlights expanded follicular dendritic meshworks. The Ki-67 shows a slightly increased proliferation rate of 30-40%. The atypical lymphocytes are negative for CD5. CD3 immunohistochemistry highlights the background T-cells. Flow cytometry was performed. A monoclonal B-cell populaton expressing CD10 comprises 92% of all lymphocytes (See [id number]). Overall, the findings are consistent with a diagnosis of low grade follicular lymphoma, Grade 1-2. Of note, the increased ki-67 may portend a more aggressive course despite the low histologic grade.   01/18/19 BM Biopsy:   05/05/19 BM Biopsy:   10/22/202 CT CHEST, ABDOMEN, AND PELVIS WITH CONTRAST (Accession [id number])    RADIOGRAPHIC STUDIES: I have personally reviewed the radiological images as listed and agreed with the findings in the report. No results found.    ASSESSMENT & PLAN:   Ethan Stewart is a 74 y.o. male with:  1) Diffuse large B-cell lymphoma stage IV AE with involvement of mesenteric, inguinal lymph nodes and C5 vertebral body with spinal cord impingement  Patient is status post six cycles of R-CHOP and R-mini-CHOP, completed in 2016, as noted above.  2) History of capillary leak syndrome with ARDS requiring intubation likely due to G-CSF.  3)  Follicular Lymphoma, Grade 1-2 currently on Revlimid + Rituxan  11/02/18 CT A/P which revealed Negative for inguinal or bowel hernia but  positive for bulky right inguinal and external iliac lymphadenopathy which is new since a 2017 PET-CT and highly suspicious for recurrent Lymphoma. This would be amenable to Ultrasound-guided needle biopsy. 2. No abdominal or contralateral left hemi pelvic lymphadenopathy. No other acute findings identified in the abdomen or pelvis. 3. Chronic pneumobilia.  Aortic Atherosclerosis.  11/12/18 PET/CT which revealed  Interval development of multistation lymphadenopathy in the abdomen and pelvis as detailed above- [place]ville 5. 2. Presence of pneumobilia could reflect sphincter of Oddi dysfunction versus recent procedure. Correlate with clinical history and upcoming comprehensive metabolic panel. 3. Ancillary CT findings   11/27/18 US guided LN Biopsy pathology report is as noted above. " B-cell lymphoma with a germinal center phenotype (GC-type), with focal follicular architecture and a low proliferation index. Given the nature of the sample (i.e. core biopsy) a final  architectural subclassification is not possible. If clinically feasible, an excisional biopsy is recommended."  12/11/18 Right Inguinal LN biopsy revealed Grade 1-2 Follicular Lymphoma  02/25/45 CT A/P revealed Today's study demonstrates clear progression of disease with increased number and size of numerous enlarged lymph nodes throughout the lower thorax, abdomen and pelvis, as detailed above. 2. In addition, there are several small pulmonary nodules in the lung bases. The largest of these are clustered in the posterior aspect of the right lower lobe in an apparent area of scarring which is very similar to prior PET-CT 03/12/2016, favored to be benign. However, the smaller nodules which measure up to 6 mm in size are nonspecific and warrant attention on follow-up studies. 3. Aortic atherosclerosis, in addition to least 2 vessel coronary artery disease. Assessment for potential risk factor modification, dietary therapy or pharmacologic therapy  may be warranted, if clinically indicated. 4. Additional incidental findings, as above. 01/18/19 BM Bx revealed normocellular bone marrow with trilineage hematopoiesis Stage II indicated with 01/26/19 CT  Began C1 of 76m Revlimid and Rituxan on 02/05/19  05/05/19 BM Bx revealed normocellular bone marrow with erythroid hyperplasia, and no evidence of lymphoma  05/11/19 PET/CT revealed "Near complete metabolic response to therapy of lymphoma since the prior PET of 11/12/2018. Low-level hypermetabolism at the site of adenopathy within the abdominal retroperitoneum persists. ([place]ville) 2. There has also been resolution of abdominopelvic adenopathy since the diagnostic CT of 01/26/2019. 2. Nonspecific right lower lobe pulmonary nodules, similar. 3. Coronary artery atherosclerosis. Aortic Atherosclerosis."   10/14/2019 CT C/A/P ([id number] revealed " 1. No findings of active lymphoma. 2. Pulmonary nodules in the right lung are mostly stable from March  2017, and considered benign, although one 5 mm right lower lobe nodule medially on image 115/6 is stable of the last year but not well seen on 03/12/2016 and may merit surveillance. 3. Reduced prominence of treatment related stranding in the left periaortic region likely the residua of prior adenopathy. 4. Other imaging findings of potential clinical significance:Coronary atherosclerosis. Mild cardiomegaly. Cholelithiasis. Mild prostatomegaly. Mild lumbar spondylosis and degenerative disc disease causing mild impingement at L3-4 and L4-5. Small left renal peripelvic cysts. Prior bilateral groin hernia repairs. Pneumobilia, query prior sphincterotomy."  05/31/2020 CT C/A/P ([id number] revealed "1. No lymphadenopathy in the chest, abdomen, or pelvis. No signs of disease recurrence. 2. Stable appearance of small RIGHT pulmonary nodules."  4) History of right lower extremity distal DVT. Resolved. S/p 6 months of treatment in 02/2016 -back on 13mXarelto while  taking Revlimid  5) Mild chronic thrombocytopenia-  128k   PLAN: -Discussed pt labwork today, 08/14/20; blood counts look good, blood chemistries look steady.  -The pt has no prohibitive toxicities from continuing maintenance Rituxan at this time. -The pt shows no lab or clinical evidence of his Large B-cell Lymphoma recurrence at this time.  -The pt shows no lab or clinical evidence of his Follicular Lymphoma progression at this time. -Discussed again the pros and cons of continuing Rituxan. Plan to continue maintenance Rituxan every two months until February 2022.   -Discussed CDC guidelines for COVID19 vaccine booster. Will give booster in clinic today.  -Will see back in 2 months with labs    FOLLOW UP: Covid booster today Plz schedule next 2 cycles of maintenance Rituxan (every 2 months) with labs and MD visit       The total time spent in the appt was 30 minutes and more than 50% was on counseling and direct patient cares, ordering and  management of Rituxan immunotherapy.  All of the patient's questions were answered with apparent satisfaction. The patient knows to call the clinic with any problems, questions or concerns.   Sullivan Lone MD [place] H[facility]ology/Oncology Physician [facility]  (Office):       [phone] (Work cell):  [phone] (Fax):           [phone]   I, Yevette Edwards, am acting as a scribe for Dr. Sullivan Lone.   .I have reviewed the above documentation for accuracy and completeness, and I agree with the above. Brunetta Genera MD

## 2020-08-14 NOTE — Patient Instructions (Signed)
[place] Discharge Instructions for Patients Receiving Chemotherapy  Today you received the following chemotherapy agents: Rituxumab.  To help prevent nausea and vomiting after your treatment, we encourage you to take your nausea medication as directed.   If you develop nausea and vomiting that is not controlled by your nausea medication, call the clinic.   BELOW ARE SYMPTOMS THAT SHOULD BE REPORTED IMMEDIATELY:  *FEVER GREATER THAN 100.5 F  *CHILLS WITH OR WITHOUT FEVER  NAUSEA AND VOMITING THAT IS NOT CONTROLLED WITH YOUR NAUSEA MEDICATION  *UNUSUAL SHORTNESS OF BREATH  *UNUSUAL BRUISING OR BLEEDING  TENDERNESS IN MOUTH AND THROAT WITH OR WITHOUT PRESENCE OF ULCERS  *URINARY PROBLEMS  *BOWEL PROBLEMS  UNUSUAL RASH Items with * indicate a potential emergency and should be followed up as soon as possible.  Feel free to call the clinic should you have any questions or concerns. The clinic phone number is [phone].  Please show the [place] at check-in to the Emergency Department and triage nurse.

## 2020-08-15 ENCOUNTER — Ambulatory Visit: Payer: No Typology Code available for payment source | Admitting: Hematology

## 2020-08-15 ENCOUNTER — Telehealth: Payer: Self-pay | Admitting: Hematology

## 2020-08-15 ENCOUNTER — Ambulatory Visit: Payer: No Typology Code available for payment source

## 2020-08-15 ENCOUNTER — Other Ambulatory Visit: Payer: No Typology Code available for payment source

## 2020-08-15 NOTE — Telephone Encounter (Signed)
Scheduled per 8/23 los. Unable to reach pt. Left voicemail with appt times and dates.

## 2020-09-14 DIAGNOSIS — L570 Actinic keratosis: Secondary | ICD-10-CM | POA: Diagnosis not present

## 2020-09-14 DIAGNOSIS — Z85828 Personal history of other malignant neoplasm of skin: Secondary | ICD-10-CM | POA: Diagnosis not present

## 2020-09-20 ENCOUNTER — Other Ambulatory Visit: Payer: Self-pay | Admitting: Hematology

## 2020-09-20 ENCOUNTER — Telehealth: Payer: Self-pay | Admitting: *Deleted

## 2020-09-20 DIAGNOSIS — C8330 Diffuse large B-cell lymphoma, unspecified site: Secondary | ICD-10-CM

## 2020-09-20 DIAGNOSIS — M542 Cervicalgia: Secondary | ICD-10-CM

## 2020-09-20 NOTE — Telephone Encounter (Signed)
Patient called. Having new pain in neck (same area as tumor diagnosed/treated in 2016). It's been going on about 3 weeks. His next appt with you is on 10/22. He wanted to know what MD thinks -- is it ok to wait to be seen - or if he should come in sooner -- or have any kind of scan before his appt.  Dr. Irene Stewart informed of patient concerns and questions.  Contacted patient with Dr. Grier Stewart response : Locally recurrent lymphoma at this time point is unlikely but he has had surgery and nned to reassess the area-- ordered MRI C spine-- to be scheduled if patient okay with this. If any other new neurological symptoms should go the ER immediately. Call office after he has MRI and can review it. Patient verbalized understanding and will schedule MRI

## 2020-09-28 ENCOUNTER — Ambulatory Visit (HOSPITAL_COMMUNITY): Payer: No Typology Code available for payment source

## 2020-10-02 DIAGNOSIS — R69 Illness, unspecified: Secondary | ICD-10-CM | POA: Diagnosis not present

## 2020-10-04 ENCOUNTER — Other Ambulatory Visit: Payer: Self-pay

## 2020-10-04 ENCOUNTER — Ambulatory Visit (HOSPITAL_COMMUNITY)
Admission: RE | Admit: 2020-10-04 | Discharge: 2020-10-04 | Disposition: A | Discharge status: Home / Self Care | Payer: No Typology Code available for payment source | Source: Ambulatory Visit | Attending: Hematology | Admitting: Hematology

## 2020-10-04 DIAGNOSIS — C8330 Diffuse large B-cell lymphoma, unspecified site: Secondary | ICD-10-CM

## 2020-10-04 DIAGNOSIS — M542 Cervicalgia: Secondary | ICD-10-CM | POA: Diagnosis present

## 2020-10-04 MED ORDER — GADOBUTROL 1 MMOL/ML IV SOLN
10.0000 mL | Freq: Once | INTRAVENOUS | Status: AC | PRN
  Administered 2020-10-04: 10 mL via INTRAVENOUS

## 2020-10-11 DIAGNOSIS — R69 Illness, unspecified: Secondary | ICD-10-CM | POA: Diagnosis not present

## 2020-10-11 DIAGNOSIS — G479 Sleep disorder, unspecified: Secondary | ICD-10-CM | POA: Diagnosis not present

## 2020-10-11 DIAGNOSIS — Z0000 Encounter for general adult medical examination without abnormal findings: Secondary | ICD-10-CM | POA: Diagnosis not present

## 2020-10-11 DIAGNOSIS — N400 Enlarged prostate without lower urinary tract symptoms: Secondary | ICD-10-CM | POA: Diagnosis not present

## 2020-10-11 DIAGNOSIS — Z125 Encounter for screening for malignant neoplasm of prostate: Secondary | ICD-10-CM | POA: Diagnosis not present

## 2020-10-11 DIAGNOSIS — G8191 Hemiplegia, unspecified affecting right dominant side: Secondary | ICD-10-CM | POA: Diagnosis not present

## 2020-10-11 DIAGNOSIS — E7800 Pure hypercholesterolemia, unspecified: Secondary | ICD-10-CM | POA: Diagnosis not present

## 2020-10-11 DIAGNOSIS — E038 Other specified hypothyroidism: Secondary | ICD-10-CM | POA: Diagnosis not present

## 2020-10-13 ENCOUNTER — Inpatient Hospital Stay (HOSPITAL_BASED_OUTPATIENT_CLINIC_OR_DEPARTMENT_OTHER): Payer: No Typology Code available for payment source | Admitting: Hematology

## 2020-10-13 ENCOUNTER — Other Ambulatory Visit: Payer: Self-pay

## 2020-10-13 ENCOUNTER — Inpatient Hospital Stay: Payer: No Typology Code available for payment source | Attending: Hematology

## 2020-10-13 ENCOUNTER — Inpatient Hospital Stay: Payer: No Typology Code available for payment source

## 2020-10-13 VITALS — BP 156/90 | HR 60 | Temp 97.8°F | Resp 18 | Ht 73.0 in | Wt 223.8 lb

## 2020-10-13 VITALS — BP 140/69 | HR 54 | Temp 97.7°F | Resp 18

## 2020-10-13 DIAGNOSIS — Z298 Encounter for other specified prophylactic measures: Secondary | ICD-10-CM

## 2020-10-13 DIAGNOSIS — C8330 Diffuse large B-cell lymphoma, unspecified site: Secondary | ICD-10-CM | POA: Diagnosis not present

## 2020-10-13 DIAGNOSIS — C8298 Follicular lymphoma, unspecified, lymph nodes of multiple sites: Secondary | ICD-10-CM | POA: Diagnosis present

## 2020-10-13 DIAGNOSIS — Z5112 Encounter for antineoplastic immunotherapy: Secondary | ICD-10-CM | POA: Insufficient documentation

## 2020-10-13 LAB — CMP (CANCER CENTER ONLY)
ALT: 24 U/L (ref 0–44)
AST: 27 U/L (ref 15–41)
Albumin: 4 g/dL (ref 3.5–5.0)
Alkaline Phosphatase: 86 U/L (ref 38–126)
Anion gap: 4 — ABNORMAL LOW (ref 5–15)
BUN: 16 mg/dL (ref 8–23)
CO2: 26 mmol/L (ref 22–32)
Calcium: 9.2 mg/dL (ref 8.9–10.3)
Chloride: 109 mmol/L (ref 98–111)
Creatinine: 1.05 mg/dL (ref 0.61–1.24)
GFR, Estimated: >60 mL/min (ref >60)
Glucose, Bld: 94 mg/dL (ref 70–99)
Potassium: 4.3 mmol/L (ref 3.5–5.1)
Sodium: 139 mmol/L (ref 135–145)
Total Bilirubin: 0.5 mg/dL (ref 0.3–1.2)
Total Protein: 6.1 g/dL — ABNORMAL LOW (ref 6.5–8.1)

## 2020-10-13 LAB — CBC WITH DIFFERENTIAL/PLATELET
Abs Immature Granulocytes: 0.01 10*3/uL (ref 0.00–0.07)
Basophils Absolute: 0 10*3/uL (ref 0.0–0.1)
Basophils Relative: 1 %
Eosinophils Absolute: 0.1 10*3/uL (ref 0.0–0.5)
Eosinophils Relative: 2 %
HCT: 40.2 % (ref 39.0–52.0)
Hemoglobin: 14.2 g/dL (ref 13.0–17.0)
Immature Granulocytes: 0 %
Lymphocytes Relative: 23 %
Lymphs Abs: 0.9 10*3/uL (ref 0.7–4.0)
MCH: 31.8 pg (ref 26.0–34.0)
MCHC: 35.3 g/dL (ref 30.0–36.0)
MCV: 90.1 fL (ref 80.0–100.0)
Monocytes Absolute: 0.5 10*3/uL (ref 0.1–1.0)
Monocytes Relative: 13 %
Neutro Abs: 2.3 10*3/uL (ref 1.7–7.7)
Neutrophils Relative %: 61 %
Platelets: 129 10*3/uL — ABNORMAL LOW (ref 150–400)
RBC: 4.46 MIL/uL (ref 4.22–5.81)
RDW: 12.7 % (ref 11.5–15.5)
WBC: 3.7 10*3/uL — ABNORMAL LOW (ref 4.0–10.5)
nRBC: 0 % (ref 0.0–0.2)

## 2020-10-13 LAB — LACTATE DEHYDROGENASE: LDH: 187 U/L (ref 98–192)

## 2020-10-13 MED ORDER — SODIUM CHLORIDE 0.9 % IV SOLN
Freq: Once | INTRAVENOUS | Status: AC
  Filled 2020-10-13: qty 250

## 2020-10-13 MED ORDER — SODIUM CHLORIDE 0.9 % IV SOLN
375.0000 mg/m2 | Freq: Once | INTRAVENOUS | Status: AC
  Administered 2020-10-13: 900 mg via INTRAVENOUS
  Filled 2020-10-13: qty 50

## 2020-10-13 MED ORDER — METHYLPREDNISOLONE SODIUM SUCC 125 MG IJ SOLR
125.0000 mg | Freq: Once | INTRAMUSCULAR | Status: AC
  Administered 2020-10-13: 125 mg via INTRAVENOUS

## 2020-10-13 MED ORDER — ACETAMINOPHEN 325 MG PO TABS
ORAL_TABLET | ORAL | Status: AC
  Filled 2020-10-13: qty 2

## 2020-10-13 MED ORDER — DIPHENHYDRAMINE HCL 25 MG PO CAPS
ORAL_CAPSULE | ORAL | Status: AC
  Filled 2020-10-13: qty 2

## 2020-10-13 MED ORDER — ACETAMINOPHEN 325 MG PO TABS
650.0000 mg | ORAL_TABLET | Freq: Once | ORAL | Status: AC
  Administered 2020-10-13: 650 mg via ORAL

## 2020-10-13 MED ORDER — METHYLPREDNISOLONE SODIUM SUCC 125 MG IJ SOLR
INTRAMUSCULAR | Status: AC
  Filled 2020-10-13: qty 2

## 2020-10-13 MED ORDER — DIPHENHYDRAMINE HCL 25 MG PO CAPS
50.0000 mg | ORAL_CAPSULE | Freq: Once | ORAL | Status: AC
  Administered 2020-10-13: 50 mg via ORAL

## 2020-10-13 NOTE — Patient Instructions (Signed)
[facility] Discharge Instructions for Patients Receiving Chemotherapy  Today you received the following chemotherapy agents:  Rituxan   To help prevent nausea and vomiting after your treatment, we encourage you to take your nausea medication as prescribed.   If you develop nausea and vomiting that is not controlled by your nausea medication, call the clinic.   BELOW ARE SYMPTOMS THAT SHOULD BE REPORTED IMMEDIATELY:  *FEVER GREATER THAN 100.5 F  *CHILLS WITH OR WITHOUT FEVER  NAUSEA AND VOMITING THAT IS NOT CONTROLLED WITH YOUR NAUSEA MEDICATION  *UNUSUAL SHORTNESS OF BREATH  *UNUSUAL BRUISING OR BLEEDING  TENDERNESS IN MOUTH AND THROAT WITH OR WITHOUT PRESENCE OF ULCERS  *URINARY PROBLEMS  *BOWEL PROBLEMS  UNUSUAL RASH Items with * indicate a potential emergency and should be followed up as soon as possible.  Feel free to call the clinic should you have any questions or concerns. The clinic phone number is [phone].  Please show the CHEMO ALERT CARD at check-in to the Emergency Department and triage nurse.   

## 2020-10-13 NOTE — Progress Notes (Signed)
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 4) left neck basal cell carcinoma- removed recently  Current treatment - Revlimid + Rituxan for follicular lymphoma  PreviousTreatment:  -Status post 6 cycles of R CHOP (dose reductions for cycle 2-3)  -Cannot use G-CSF due to issues with severe G-CSF related ARDS after cycle 1 (requiring prolonged intubation and ventilatory support)   ONCOLOGIC HISTORY  Diffuse large B-cell lymphoma stage IV AE with involvement of mesenteric, inguinal lymph nodes and C5 vertebral body with spinal cord impingement   He had a C5 corpectomy on 07/26/2015 that showed diffuse large B-cell lymphoma with a Ki-67 ranging from 20 to 90%. Cytogenetics and Fish showed BCL 2 positivity but negative for BCL 6 and cMYC CD10 positivity suggestive of possibly GCB subtype.  Patient received first cycle of R CHOP on 08/08/2015 and intrathecal methotrexate with hydrocortisone on 08/09/2015. Patient subsequently was admitted with ARDS likely due to neulasta -treated with high dose steroids and empirically with broad spectrum antibiotics.  2nd cycle of chemotherapy delayed to allow for rehabilitation (was due on 08/31/2015).  Received R-mini-CHOP for cycle 2 and tolerated it without significant cytopenias. Was admitted briefly for a mild lower extremity cellulitis and DVT. Cellulitis now resolved. Patient completing his oral antibiotics.  PET/CT scan on 09/29/2015 with no evidence of disease progression. Good response in his mesenteric lymph nodes. Inguinal lymph  nodes on the right side. Decreased uptake in his C5 level.  No new lesions noted.   Cycle 3 (10/02/2015)  of R- CHOP ( we increased the doxorubicin dose back to 50 mg/m and keep the cyclophosphamide dose reduction at 400 mg/m]  Cycle 4 (10/23/2015) of R-CHOP ( we increased the doxorubicin dose back to 50 mg/m and kept the cyclophosphamide dose reduction at 400 mg/m].  PET/CT 11/10/2015: Shows improvement in most lesions but is noted to have a new FDG avid lymph node in the mesentery of the small bowel.  Cycle 5 on 11/13/2015 R CHOP - received full dose.  Cycle 6 on 12/11/2015 R-CHOP full dose.  Admitted with neutropenic fevers and treatment for possible early pneumonia.  This resolved and he was able to discharge home  01/09/2016 through 01/22/2016: The patient was treated to the C4-C6 levels of the spine to a dose of 30 gray in 10 fractions using a 2 field technique. The patient was also treated to an abdominal lymph node region to a dose of 30 gray in 10 fractions using a 3 field technique. This treatment consisted of a 3-D conformal technique with daily image guidance utilized.   Repeat PET CT scan in March 12 2016 -showed some enlarged mesenteric lymph nodes which were FDG avid.  CT guided FNA of mesenteric lymph nodes at [facility] on 03/29/2016 - showed scant cellularity. No overt evidence of lymphoma.  Rpt PET/CT 05/15/2016 at [facility]- show similar appearing enlarged mesenteric lymph nodes which are FDG avid. No change in size and no other evidence of disease progression.  PET/CT 07/24/2016 - and [facility] shows no evidence of disease progression  PET/CT 01/22/2017:  Result Impression   1.Stable low-level metabolic activity in small lymph nodes in the small bowel mesentery. No new FDG avid disease. 2.Persistent hypermetabolic soft tissue thickening overlying the coccyx which could relate to a decubitus ulcer and should be amenable to  direct inspection. 3.Stable pulmonary nodules which do not appear hypermetabolic, however, several are below the size resolution of PET in size. Continued attention on follow-up is suggested. 4.Previously seen hypermetabolic skin thickening near the right ear has resolved. 5.Ancillary CT findings as above.    INTERVAL HISTORY:  Ethan Stewart is here for management and evaluation of his recently diagnosed Follicular Lymphoma, grade 1-2. Pt is here for maintenance Rituxan. The patient's last visit with [place] was on 08/14/2020. The pt reports that he is doing well overall.  The pt reports that he continues to have some neck pain but denies any other issues. Pt tolerated his COVID19 booster well.   Of note since the patient's last visit, pt has had MRI Cervical Spine ([id number]) completed on 10/04/2020 with results revealing "1. Postsurgical changes related to C5 corpectomy with fusion from C4 through C6. No evidence of edema or abnormal contrast enhancement to suggest recurrent disease. 2. Mild multilevel degenerative changes of the cervical spine with mild spinal canal stenosis at C6-C7. 3. High-grade bilateral neuroforaminal stenosis at C5-6 and severe right neural foraminal narrowing at C6-7."  Lab results today (10/13/20) of CBC w/diff and CMP is as follows: all values are WNL except for WBC at 3.7K, PLT at 129K, Total Protein at 6.1, Anion gap at 4. 10/13/2020 LDH at 187  On review of systems, pt reports neck pain and denies fevers, chills, night sweats, rash and any other symptoms.    REVIEW OF SYSTEMS:   A 10+ POINT REVIEW OF SYSTEMS WAS OBTAINED including neurology, dermatology, psychiatry, cardiac, respiratory, lymph, extremities, GI, GU, Musculoskeletal, constitutional, breasts, reproductive, HEENT.  All pertinent positives are noted in the HPI.  All others are negative.   ALLERGIES:  is allergic to neulasta [pegfilgrastim].  MEDICATIONS:  Current Outpatient Medications  Medication  Sig Dispense Refill  . diazepam (VALIUM) 2 MG tablet Take 2 mg by mouth daily as needed for anxiety.    . ergocalciferol (VITAMIN D2) 1.25 MG (50000 UT) capsule Take 1 capsule (50,000 Units total) by mouth once a week. 12 capsule 4  . levothyroxine (SYNTHROID, LEVOTHROID) 88 MCG tablet Take 88 mcg by mouth daily before breakfast.   2  . Multiple Vitamin (MULTIVITAMIN WITH MINERALS) TABS tablet Take 1 tablet by mouth daily.     . simvastatin (ZOCOR) 20 MG tablet Take 20 mg by mouth daily.    . tamsulosin (FLOMAX) 0.4 MG CAPS capsule Take 1 capsule (0.4 mg total) by mouth at bedtime. 30 capsule 3  . temazepam (RESTORIL) 15 MG capsule Take 15 mg by mouth at bedtime as needed for sleep.     . Vitamins/Minerals TABS Take by mouth.     No current facility-administered medications for this visit.    PHYSICAL EXAMINATION: ECOG FS:1 - Symptomatic but completely ambulatory  Vitals:   10/13/20 0952  BP: (!) 156/90  Pulse: 60  Resp: 18  Temp: 97.8 F (36.6 C)  SpO2: 99%   Wt Readings from Last 3 Encounters:  10/13/20 223 lb 12.8 oz (101.5 kg)  08/14/20 221 lb 8 oz (100.5 kg)  04/12/20 226 lb 1.6 oz (102.6 kg)   Body mass index is 29.53 kg/m.   GENERAL:alert, in no acute distress and comfortable SKIN: no acute  rashes, no significant lesions EYES: conjunctiva are pink and non-injected, sclera anicteric OROPHARYNX: MMM, no exudates, no oropharyngeal erythema or ulceration NECK: supple, no JVD LYMPH:  no palpable lymphadenopathy in the cervical, axillary or inguinal regions LUNGS: clear to auscultation b/l with normal respiratory effort HEART: regular rate & rhythm ABDOMEN:  normoactive bowel sounds , non tender, not distended. No palpable hepatosplenomegaly.  Extremity: no pedal edema PSYCH: alert & oriented x 3 with fluent speech NEURO: no focal motor/sensory deficits   LABORATORY DATA:   CBC Latest Ref Rng & Units 10/13/2020 08/14/2020 06/28/2020  WBC 4.0 - 10.5 K/uL 3.7(L) 3.7(L)  3.1(L)  Hemoglobin 13.0 - 17.0 g/dL 14.2 14.4 14.7  Hematocrit 39 - 52 % 40.2 41.4 42.9  Platelets 150 - 400 K/uL 129(L) 110(L) 121(L)     CMP Latest Ref Rng & Units 10/13/2020 08/14/2020 06/12/2020  Glucose 70 - 99 mg/dL 94 103(H) 85  BUN 8 - 23 mg/dL _0 Creatinine 0.61 - 1.24 mg/dL 1.05 1.06 1.00  Sodium 135 - 145 mmol/L 139 140 143  Potassium 3.5 - 5.1 mmol/L 4.3 4.4 4.3  Chloride 98 - 111 mmol/L 109 109 109  CO2 22 - 32 mmol/L _1 Calcium 8.9 - 10.3 mg/dL 9.2 9.5 9.1  Total Protein 6.5 - 8.1 g/dL 6.1(L) 6.1(L) 6.1(L)  Total Bilirubin 0.3 - 1.2 mg/dL 0.5 0.7 0.6  Alkaline Phos 38 - 126 U/L 86 94 108  AST 15 - 41 U/L _2 ALT 0 - 44 U/L _3 . Lab Results  Component Value Date   LDH 187 10/13/2020    Fine Needle Aspiration, Lymph NodeResulted: 12/04/2018 1:14 PM [facility] Result Narrative  ACCESSION NUMBER: [id number] RECEIVED: 11/27/2018 ORDERING PHYSICIAN: RAKHEE Megan Mans , MD PATIENT NAME: Ethan Stewart CYTOLOGY REPORT * Amended * Final Cytologic Interpretation AMENDED REPORT  AMENDED 12/04/2018 TO ADD DIAGNOSIS   A. Right inguinal lymph node, Fine Needle Aspiration II (smears and cell block): B-cell lymphoma with germinal center phenotype (see comment)  Specimen Adequacy: Satisfactory for evaluation.  B. Cytology core biopsy: B-cell lymphoma with germinal center phenotype (see comment)  Specimen Adequacy: Satisfactory for evaluation.    COMMENT:Sections of the core biopsy demonstrate an atypical vaguely nodular lymphoid infiltrate composed of medium sized lymphoid cells with mature chromatin, irregular nuclear membranes and scant cytoplasm. Focally some areas show larger cells. Few mitotic figures are present. Appropriately controlled immunohistochemical stains are performed. Sheets of B-cells are highlighted by CD20. These B-cells co-express BCL2 and BCL6 (focal, nodular  distribution). They are negative for CD30 and MYC. CD21 demonstrates few residual disrupted follicular dendritic meshworks. The proliferation index is low (10-20%), as demonstrated by Ki-67. Scattered small T-cells are highlighted by CD3. Concurrent flow cytometry identified a kappa light chain restricted B-cell population, co-expressing CD10.  Overall, the findings are consistent with involvement by a B-cell lymphoma with a germinal center phenotype (GC-type), with focal follicular architecture and a low proliferation index. Given the nature of the sample (i.e. core biopsy) a final architectural subclassification is not possible. If clinically feasible, an excisional biopsy is recommended.  Correlation with clinical and cytogenetic/molecular data is recommended.    12/11/18 Inguinal LN Biopsy  Interpretation Tissue-Flow Cytometry - MONOCLONAL B-CELL POPULATION EXPRESSING CD10 COMPRISES 92% OF ALL LYMPHOCYTES - SEE COMMENT Microscopic Gated population: Flow cytometric immunophenotyping is performed using antibiodies to the antigens listed in the table below. Electronic gates are placed around a  cell cluster displaying light scatter properties corresponding to lymphocytes. - Abnormal Cells in gated population: 92 % - Phenotype of Abnormal Cells: CD10, CD19, CD20, CD21, CD22, HLA-Dr, Kappa Diagnosis Lymph node for lymphoma, Deep Right Inguinal - FOLLICULAR LYMPHOMA, GRADE 1-2 - SEE COMMENT Microscopic Comment The nodal architecture is effaced by a proliferation back to back follicles composed of medium to large lymphocytes with irregular, cleaved nuclei and scattered centroblasts. There are no diffuse areas of growth present within the examined nodal material. Immunohistochemistry performed on the tissue highlights a nodular B-cell proliferation with expression of CD20, CD10, bcl-6, and Bcl-2. CD21 highlights expanded follicular dendritic meshworks. The Ki-67 shows a slightly  increased proliferation rate of 30-40%. The atypical lymphocytes are negative for CD5. CD3 immunohistochemistry highlights the background T-cells. Flow cytometry was performed. A monoclonal B-cell populaton expressing CD10 comprises 92% of all lymphocytes (See [id number]). Overall, the findings are consistent with a diagnosis of low grade follicular lymphoma, Grade 1-2. Of note, the increased ki-67 may portend a more aggressive course despite the low histologic grade.   01/18/19 BM Biopsy:   05/05/19 BM Biopsy:   10/22/202 CT CHEST, ABDOMEN, AND PELVIS WITH CONTRAST (Accession [id number])    RADIOGRAPHIC STUDIES: I have personally reviewed the radiological images as listed and agreed with the findings in the report. No results found.    ASSESSMENT & PLAN:   Ethan Stewart is a 74 y.o. male with:  1) Diffuse large B-cell lymphoma stage IV AE with involvement of mesenteric, inguinal lymph nodes and C5 vertebral body with spinal cord impingement  Patient is status post six cycles of R-CHOP and R-mini-CHOP, completed in 2016, as noted above.  2) History of capillary leak syndrome with ARDS requiring intubation likely due to G-CSF.  3)  Follicular Lymphoma, Grade 1-2 currently on Revlimid + Rituxan  11/02/18 CT A/P which revealed Negative for inguinal or bowel hernia but positive for bulky right inguinal and external iliac lymphadenopathy which is new since a 2017 PET-CT and highly suspicious for recurrent Lymphoma. This would be amenable to Ultrasound-guided needle biopsy. 2. No abdominal or contralateral left hemi pelvic lymphadenopathy. No other acute findings identified in the abdomen or pelvis. 3. Chronic pneumobilia.  Aortic Atherosclerosis.  11/12/18 PET/CT which revealed  Interval development of multistation lymphadenopathy in the abdomen and pelvis as detailed above- Deauville 5. 2. Presence of pneumobilia could reflect sphincter of Oddi dysfunction versus recent procedure.  Correlate with clinical history and upcoming comprehensive metabolic panel. 3. Ancillary CT findings   11/27/18 US guided LN Biopsy pathology report is as noted above. " B-cell lymphoma with a germinal center phenotype (GC-type), with focal follicular architecture and a low proliferation index. Given the nature of the sample (i.e. core biopsy) a final architectural subclassification is not possible. If clinically feasible, an excisional biopsy is recommended."  12/11/18 Right Inguinal LN biopsy revealed Grade 1-2 Follicular Lymphoma  8/0/99 CT A/P revealed Today's study demonstrates clear progression of disease with increased number and size of numerous enlarged lymph nodes throughout the lower thorax, abdomen and pelvis, as detailed above. 2. In addition, there are several small pulmonary nodules in the lung bases. The largest of these are clustered in the posterior aspect of the right lower lobe in an apparent area of scarring which is very similar to prior PET-CT 03/12/2016, favored to be benign. However, the smaller nodules which measure up to 6 mm in size are nonspecific and warrant attention on follow-up studies. 3. Aortic atherosclerosis, in addition to least  2 vessel coronary artery disease. Assessment for potential risk factor modification, dietary therapy or pharmacologic therapy may be warranted, if clinically indicated. 4. Additional incidental findings, as above. 01/18/19 BM Bx revealed normocellular bone marrow with trilineage hematopoiesis Stage II indicated with 01/26/19 CT  Began C1 of 42m Revlimid and Rituxan on 02/05/19  05/05/19 BM Bx revealed normocellular bone marrow with erythroid hyperplasia, and no evidence of lymphoma  05/11/19 PET/CT revealed "Near complete metabolic response to therapy of lymphoma since the prior PET of 11/12/2018. Low-level hypermetabolism at the site of adenopathy within the abdominal retroperitoneum persists. (Deauville) 2. There has also been resolution  of abdominopelvic adenopathy since the diagnostic CT of 01/26/2019. 2. Nonspecific right lower lobe pulmonary nodules, similar. 3. Coronary artery atherosclerosis. Aortic Atherosclerosis."   10/14/2019 CT C/A/P ([id number] revealed " 1. No findings of active lymphoma. 2. Pulmonary nodules in the right lung are mostly stable from March  2017, and considered benign, although one 5 mm right lower lobe nodule medially on image 115/6 is stable of the last year but not well seen on 03/12/2016 and may merit surveillance. 3. Reduced prominence of treatment related stranding in the left periaortic region likely the residua of prior adenopathy. 4. Other imaging findings of potential clinical significance:Coronary atherosclerosis. Mild cardiomegaly. Cholelithiasis. Mild prostatomegaly. Mild lumbar spondylosis and degenerative disc disease causing mild impingement at L3-4 and L4-5. Small left renal peripelvic cysts. Prior bilateral groin hernia repairs. Pneumobilia, query prior sphincterotomy."  05/31/2020 CT C/A/P ([id number] revealed "1. No lymphadenopathy in the chest, abdomen, or pelvis. No signs of disease recurrence. 2. Stable appearance of small RIGHT pulmonary nodules."  4) History of right lower extremity distal DVT. Resolved. S/p 6 months of treatment in 02/2016 -back on 18mXarelto while taking Revlimid  5) Mild chronic thrombocytopenia-  128k   PLAN: -Discussed pt labwork today, 10/13/20; WBC & PLT are mildly low, no anemia, blood chemistries are okay, LDH is WNL - improved.  -Discussed 10/04/2020 MRI Cervical Spine ([id number] revealed post-surgical changes & neural foraminal stenosis.  -No lab or clinical evidence of Follicular Lymphoma progression/recurrence.  -No lab or clinical evidence of his Large B-cell Lymphoma progression/recurrence at this time.  -The pt has no prohibitive toxicities from continuing maintenance Rituxan every two months at this time.  -Advised pt that the purpose  of maintenance treatment is to get as deep of a response as possible. -Will see back in 2 months with labs      FOLLOW UP: Plz schedule next 2 cycles of maintenance Rituxan (every 2 months) with labs and MD visit       The total time spent in the appt was 20 minutes and more than 50% was on counseling and direct patient cares.  All of the patient's questions were answered with apparent satisfaction. The patient knows to call the clinic with any problems, questions or concerns.   GaSullivan LoneD MSMcIntoshematology/Oncology Physician [facility](Office):       [phone] cell):  [phone]):           [phone] I, JaYevette Edwardsam acting as a scribe for Dr. GaSullivan Lone  .I have reviewed the above documentation for accuracy and completeness, and I agree with the above. .GBrunetta GeneraD

## 2020-11-08 DIAGNOSIS — E038 Other specified hypothyroidism: Secondary | ICD-10-CM | POA: Diagnosis not present

## 2020-11-08 DIAGNOSIS — N400 Enlarged prostate without lower urinary tract symptoms: Secondary | ICD-10-CM | POA: Diagnosis not present

## 2020-11-08 DIAGNOSIS — E7800 Pure hypercholesterolemia, unspecified: Secondary | ICD-10-CM | POA: Diagnosis not present

## 2020-11-23 ENCOUNTER — Encounter: Payer: Self-pay | Admitting: *Deleted

## 2020-11-23 ENCOUNTER — Telehealth: Payer: Self-pay | Admitting: *Deleted

## 2020-11-23 NOTE — Telephone Encounter (Signed)
Patient called - requested letter sent to [facility] stating he needs to continue treatment at [facility].  Faxed letter of necessity from Dr. Irene Limbo to [facility] fax [phone] ([place], [place] Nurse Navigator: Ph [phone])  providing info related to continuation of care and MD notes from past 2 OV  Attempted to fax x3. Fax failed each time. Contacted Tonita [place] and left VM with this information and asked for alternate fax number.

## 2020-11-27 ENCOUNTER — Telehealth: Payer: Self-pay | Admitting: *Deleted

## 2020-11-27 NOTE — Telephone Encounter (Signed)
Letter of necessity from Dr. Irene Limbo faxed to [facility] fax [phone] ([place], [place] Nurse Navigator: Ph [phone])  providing info related to continuation of care and MD notes from past 2 OV Refaxed on 12/3 - fax confirmation received

## 2020-12-13 DIAGNOSIS — R69 Illness, unspecified: Secondary | ICD-10-CM | POA: Diagnosis not present

## 2020-12-14 ENCOUNTER — Inpatient Hospital Stay (HOSPITAL_BASED_OUTPATIENT_CLINIC_OR_DEPARTMENT_OTHER): Payer: No Typology Code available for payment source | Admitting: Hematology

## 2020-12-14 ENCOUNTER — Inpatient Hospital Stay: Payer: No Typology Code available for payment source | Attending: Hematology

## 2020-12-14 ENCOUNTER — Inpatient Hospital Stay: Payer: No Typology Code available for payment source

## 2020-12-14 ENCOUNTER — Other Ambulatory Visit: Payer: Self-pay

## 2020-12-14 VITALS — BP 144/75 | HR 64 | Temp 96.2°F | Resp 18 | Ht 73.0 in | Wt 226.6 lb

## 2020-12-14 VITALS — BP 128/61 | HR 62 | Temp 97.7°F | Resp 17

## 2020-12-14 DIAGNOSIS — Z5112 Encounter for antineoplastic immunotherapy: Secondary | ICD-10-CM | POA: Insufficient documentation

## 2020-12-14 DIAGNOSIS — Z79899 Other long term (current) drug therapy: Secondary | ICD-10-CM | POA: Diagnosis not present

## 2020-12-14 DIAGNOSIS — C8330 Diffuse large B-cell lymphoma, unspecified site: Secondary | ICD-10-CM

## 2020-12-14 DIAGNOSIS — C8298 Follicular lymphoma, unspecified, lymph nodes of multiple sites: Secondary | ICD-10-CM | POA: Diagnosis present

## 2020-12-14 DIAGNOSIS — Z298 Encounter for other specified prophylactic measures: Secondary | ICD-10-CM

## 2020-12-14 LAB — CMP (CANCER CENTER ONLY)
ALT: 28 U/L (ref 0–44)
AST: 24 U/L (ref 15–41)
Albumin: 4 g/dL (ref 3.5–5.0)
Alkaline Phosphatase: 81 U/L (ref 38–126)
Anion gap: 6 (ref 5–15)
BUN: 19 mg/dL (ref 8–23)
CO2: 27 mmol/L (ref 22–32)
Calcium: 9 mg/dL (ref 8.9–10.3)
Chloride: 109 mmol/L (ref 98–111)
Creatinine: 1.11 mg/dL (ref 0.61–1.24)
GFR, Estimated: >60 mL/min (ref >60)
Glucose, Bld: 92 mg/dL (ref 70–99)
Potassium: 4 mmol/L (ref 3.5–5.1)
Sodium: 142 mmol/L (ref 135–145)
Total Bilirubin: 0.6 mg/dL (ref 0.3–1.2)
Total Protein: 6.4 g/dL — ABNORMAL LOW (ref 6.5–8.1)

## 2020-12-14 LAB — CBC WITH DIFFERENTIAL/PLATELET
Abs Immature Granulocytes: 0.01 10*3/uL (ref 0.00–0.07)
Basophils Absolute: 0 10*3/uL (ref 0.0–0.1)
Basophils Relative: 1 %
Eosinophils Absolute: 0.1 10*3/uL (ref 0.0–0.5)
Eosinophils Relative: 2 %
HCT: 44.1 % (ref 39.0–52.0)
Hemoglobin: 15 g/dL (ref 13.0–17.0)
Immature Granulocytes: 0 %
Lymphocytes Relative: 23 %
Lymphs Abs: 1 10*3/uL (ref 0.7–4.0)
MCH: 30.6 pg (ref 26.0–34.0)
MCHC: 34 g/dL (ref 30.0–36.0)
MCV: 90 fL (ref 80.0–100.0)
Monocytes Absolute: 0.5 10*3/uL (ref 0.1–1.0)
Monocytes Relative: 13 %
Neutro Abs: 2.6 10*3/uL (ref 1.7–7.7)
Neutrophils Relative %: 61 %
Platelets: 121 10*3/uL — ABNORMAL LOW (ref 150–400)
RBC: 4.9 MIL/uL (ref 4.22–5.81)
RDW: 12.5 % (ref 11.5–15.5)
WBC: 4.2 10*3/uL (ref 4.0–10.5)
nRBC: 0 % (ref 0.0–0.2)

## 2020-12-14 LAB — LACTATE DEHYDROGENASE: LDH: 173 U/L (ref 98–192)

## 2020-12-14 MED ORDER — ACETAMINOPHEN 325 MG PO TABS
650.0000 mg | ORAL_TABLET | Freq: Once | ORAL | Status: AC
  Administered 2020-12-14: 650 mg via ORAL

## 2020-12-14 MED ORDER — METHYLPREDNISOLONE SODIUM SUCC 125 MG IJ SOLR
INTRAMUSCULAR | Status: AC
  Filled 2020-12-14: qty 2

## 2020-12-14 MED ORDER — METHYLPREDNISOLONE SODIUM SUCC 125 MG IJ SOLR
125.0000 mg | Freq: Once | INTRAMUSCULAR | Status: AC
  Administered 2020-12-14: 125 mg via INTRAVENOUS

## 2020-12-14 MED ORDER — SODIUM CHLORIDE 0.9 % IV SOLN
Freq: Once | INTRAVENOUS | Status: AC
  Filled 2020-12-14: qty 250

## 2020-12-14 MED ORDER — ACETAMINOPHEN 325 MG PO TABS
ORAL_TABLET | ORAL | Status: AC
  Filled 2020-12-14: qty 2

## 2020-12-14 MED ORDER — DIPHENHYDRAMINE HCL 25 MG PO CAPS
ORAL_CAPSULE | ORAL | Status: AC
  Filled 2020-12-14: qty 2

## 2020-12-14 MED ORDER — DIPHENHYDRAMINE HCL 25 MG PO CAPS
50.0000 mg | ORAL_CAPSULE | Freq: Once | ORAL | Status: AC
  Administered 2020-12-14: 50 mg via ORAL

## 2020-12-14 MED ORDER — SODIUM CHLORIDE 0.9 % IV SOLN
375.0000 mg/m2 | Freq: Once | INTRAVENOUS | Status: AC
  Administered 2020-12-14: 900 mg via INTRAVENOUS
  Filled 2020-12-14: qty 90

## 2020-12-14 NOTE — Patient Instructions (Signed)
[facility] Discharge Instructions for Patients Receiving Chemotherapy  Today you received the following chemotherapy agents:  Rituxan.  To help prevent nausea and vomiting after your treatment, we encourage you to take your nausea medication as directed.   If you develop nausea and vomiting that is not controlled by your nausea medication, call the clinic.   BELOW ARE SYMPTOMS THAT SHOULD BE REPORTED IMMEDIATELY:  *FEVER GREATER THAN 100.5 F  *CHILLS WITH OR WITHOUT FEVER  NAUSEA AND VOMITING THAT IS NOT CONTROLLED WITH YOUR NAUSEA MEDICATION  *UNUSUAL SHORTNESS OF BREATH  *UNUSUAL BRUISING OR BLEEDING  TENDERNESS IN MOUTH AND THROAT WITH OR WITHOUT PRESENCE OF ULCERS  *URINARY PROBLEMS  *BOWEL PROBLEMS  UNUSUAL RASH Items with * indicate a potential emergency and should be followed up as soon as possible.  Feel free to call the clinic should you have any questions or concerns. The clinic phone number is [phone].  Please show the CHEMO ALERT CARD at check-in to the Emergency Department and triage nurse.   

## 2020-12-14 NOTE — Progress Notes (Signed)
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 4) left neck basal cell carcinoma- removed recently  Current treatment - Revlimid + Rituxan for follicular lymphoma  PreviousTreatment:  -Status post 6 cycles of R CHOP (dose reductions for cycle 2-3)  -Cannot use G-CSF due to issues with severe G-CSF related ARDS after cycle 1 (requiring prolonged intubation and ventilatory support)   ONCOLOGIC HISTORY  Diffuse large B-cell lymphoma stage IV AE with involvement of mesenteric, inguinal lymph nodes and C5 vertebral body with spinal cord impingement   He had a C5 corpectomy on 07/26/2015 that showed diffuse large B-cell lymphoma with a Ki-67 ranging from 20 to 90%. Cytogenetics and Fish showed BCL 2 positivity but negative for BCL 6 and cMYC CD10 positivity suggestive of possibly GCB subtype.  Patient received first cycle of R CHOP on 08/08/2015 and intrathecal methotrexate with hydrocortisone on 08/09/2015. Patient subsequently was admitted with ARDS likely due to neulasta -treated with high dose steroids and empirically with broad spectrum antibiotics.  2nd cycle of chemotherapy delayed to allow for rehabilitation (was due on 08/31/2015).  Received R-mini-CHOP for cycle 2 and tolerated it without significant cytopenias. Was admitted briefly for a mild lower extremity cellulitis and DVT. Cellulitis now resolved. Patient completing his oral antibiotics.  PET/CT scan on 09/29/2015 with no evidence of disease progression. Good response in his mesenteric lymph nodes. Inguinal lymph  nodes on the right side. Decreased uptake in his C5 level.  No new lesions noted.   Cycle 3 (10/02/2015)  of R- CHOP ( we increased the doxorubicin dose back to 50 mg/m and keep the cyclophosphamide dose reduction at 400 mg/m]  Cycle 4 (10/23/2015) of R-CHOP ( we increased the doxorubicin dose back to 50 mg/m and kept the cyclophosphamide dose reduction at 400 mg/m].  PET/CT 11/10/2015: Shows improvement in most lesions but is noted to have a new FDG avid lymph node in the mesentery of the small bowel.  Cycle 5 on 11/13/2015 R CHOP - received full dose.  Cycle 6 on 12/11/2015 R-CHOP full dose.  Admitted with neutropenic fevers and treatment for possible early pneumonia.  This resolved and he was able to discharge home  01/09/2016 through 01/22/2016: The patient was treated to the C4-C6 levels of the spine to a dose of 30 gray in 10 fractions using a 2 field technique. The patient was also treated to an abdominal lymph node region to a dose of 30 gray in 10 fractions using a 3 field technique. This treatment consisted of a 3-D conformal technique with daily image guidance utilized.   Repeat PET CT scan in March 12 2016 -showed some enlarged mesenteric lymph nodes which were FDG avid.  CT guided FNA of mesenteric lymph nodes at [facility] on 03/29/2016 - showed scant cellularity. No overt evidence of lymphoma.  Rpt PET/[facility] 05/15/2016 [facility]- show similar appearing enlarged mesenteric lymph nodes which are FDG avid. No change in size and no other evidence of disease progression.  PET/CT 07/24/2016 - and [facility] shows no evidence of disease progression  PET/CT 01/22/2017:  Result Impression   1.Stable low-level metabolic activity in small lymph nodes in the small bowel mesentery. No new FDG avid disease. 2.Persistent hypermetabolic soft tissue thickening overlying the coccyx which could relate to a decubitus ulcer and should be amenable to  direct inspection. 3.Stable pulmonary nodules which do not appear hypermetabolic, however, several are below the size resolution of PET in size. Continued attention on follow-up is suggested. 4.Previously seen hypermetabolic skin thickening near the right ear has resolved. 5.Ancillary CT findings as above.    INTERVAL HISTORY:  Ethan Stewart is here for management and evaluation of his recently diagnosed Follicular Lymphoma, grade 1-2. Pt is here for maintenance Rituxan. The patient's last visit with [place] was on 10/13/2020. The pt reports that he is doing well overall.  The pt reports that he continues to feel well and denies any new symptoms. Pt has received his COVID19 booster and annual flu vaccine.   Lab results today (12/14/20) of CBC w/diff and CMP is as follows: all values are WNL except for PLT at 121K, Total Protein at 6.4. 12/14/2020 LDH at 173  On review of systems, pt denies fevers, chills, night sweats, new lumps/bumps and any other symptoms.    REVIEW OF SYSTEMS:   A 10+ POINT REVIEW OF SYSTEMS WAS OBTAINED including neurology, dermatology, psychiatry, cardiac, respiratory, lymph, extremities, GI, GU, Musculoskeletal, constitutional, breasts, reproductive, HEENT.  All pertinent positives are noted in the HPI.  All others are negative.   ALLERGIES:  is allergic to neulasta [pegfilgrastim].  MEDICATIONS:  Current Outpatient Medications  Medication Sig Dispense Refill  . diazepam (VALIUM) 2 MG tablet Take 2 mg by mouth daily as needed for anxiety.    . ergocalciferol (VITAMIN D2) 1.25 MG (50000 UT) capsule Take 1 capsule (50,000 Units total) by mouth once a week. 12 capsule 4  . levothyroxine (SYNTHROID, LEVOTHROID) 88 MCG tablet Take 88 mcg by mouth daily before breakfast.   2  . Multiple Vitamin (MULTIVITAMIN WITH MINERALS) TABS tablet Take 1 tablet by mouth daily.     . simvastatin (ZOCOR) 20 MG tablet Take 20 mg by mouth daily.    . tamsulosin (FLOMAX) 0.4 MG CAPS  capsule Take 1 capsule (0.4 mg total) by mouth at bedtime. 30 capsule 3  . temazepam (RESTORIL) 15 MG capsule Take 15 mg by mouth at bedtime as needed for sleep.     . Vitamins/Minerals TABS Take by mouth.     No current facility-administered medications for this visit.    PHYSICAL EXAMINATION: ECOG FS:1 - Symptomatic but completely ambulatory  There were no vitals filed for this visit. Wt Readings from Last 3 Encounters:  10/13/20 223 lb 12.8 oz (101.5 kg)  08/14/20 221 lb 8 oz (100.5 kg)  04/12/20 226 lb 1.6 oz (102.6 kg)   There is no height or weight on file to calculate BMI.   GENERAL:alert, in no acute distress and comfortable SKIN: no acute rashes, no significant lesions EYES: conjunctiva are pink and non-injected, sclera anicteric OROPHARYNX: MMM, no exudates, no oropharyngeal erythema or ulceration NECK: supple, no JVD LYMPH:  no palpable lymphadenopathy in the cervical, axillary or inguinal regions LUNGS: clear to auscultation b/l with normal respiratory effort HEART: regular rate & rhythm ABDOMEN:  normoactive bowel sounds , non tender, not distended. No palpable hepatosplenomegaly.  Extremity: no pedal edema PSYCH: alert & oriented x 3 with fluent speech NEURO: no focal motor/sensory deficits   LABORATORY DATA:   CBC Latest Ref Rng & Units 10/13/2020 08/14/2020 06/28/2020  WBC 4.0 - 10.5 K/uL 3.7(L) 3.7(L) 3.1(L)  Hemoglobin 13.0 - 17.0 g/dL 14.2 14.4 14.7  Hematocrit 39.0 - 52.0 % 40.2 41.4 42.9  Platelets 150 - 400 K/uL 129(L) 110(L) 121(L)     CMP Latest Ref Rng & Units 10/13/2020 08/14/2020 06/12/2020  Glucose 70 - 99 mg/dL 94 103(H) 85  BUN 8 - 23 mg/dL _0 Creatinine 0.61 - 1.24 mg/dL 1.05 1.06 1.00  Sodium 135 - 145 mmol/L 139 140 143  Potassium 3.5 - 5.1 mmol/L 4.3 4.4 4.3  Chloride 98 - 111 mmol/L 109 109 109  CO2 22 - 32 mmol/L _1 Calcium 8.9 - 10.3 mg/dL 9.2 9.5 9.1  Total Protein 6.5 - 8.1 g/dL 6.1(L) 6.1(L) 6.1(L)  Total Bilirubin 0.3  - 1.2 mg/dL 0.5 0.7 0.6  Alkaline Phos 38 - 126 U/L 86 94 108  AST 15 - 41 U/L _2 ALT 0 - 44 U/L _3 . Lab Results  Component Value Date   LDH 187 10/13/2020    Fine Needle Aspiration, Lymph NodeResulted: 12/04/2018 1:14 PM [facility] Result Narrative  ACCESSION NUMBER: [id number] RECEIVED: 11/27/2018 ORDERING PHYSICIAN: RAKHEE Megan Mans , MD PATIENT NAME: Ethan Stewart * Amended * Final Cytologic Interpretation AMENDED Stewart  AMENDED 12/04/2018 TO ADD DIAGNOSIS   A. Right inguinal lymph node, Fine Needle Aspiration II (smears and cell block): B-cell lymphoma with germinal center phenotype (see comment)  Specimen Adequacy: Satisfactory for evaluation.  B. Cytology core biopsy: B-cell lymphoma with germinal center phenotype (see comment)  Specimen Adequacy: Satisfactory for evaluation.    COMMENT:Sections of the core biopsy demonstrate an atypical vaguely nodular lymphoid infiltrate composed of medium sized lymphoid cells with mature chromatin, irregular nuclear membranes and scant cytoplasm. Focally some areas show larger cells. Few mitotic figures are present. Appropriately controlled immunohistochemical stains are performed. Sheets of B-cells are highlighted by CD20. These B-cells co-express BCL2 and BCL6 (focal, nodular distribution). They are negative for CD30 and MYC. CD21 demonstrates few residual disrupted follicular dendritic meshworks. The proliferation index is low (10-20%), as demonstrated by Ki-67. Scattered small T-cells are highlighted by CD3. Concurrent flow cytometry identified a kappa light chain restricted B-cell population, co-expressing CD10.  Overall, the findings are consistent with involvement by a B-cell lymphoma with a germinal center phenotype (GC-type), with focal follicular architecture and a low proliferation index. Given the nature of the sample  (i.e. core biopsy) a final architectural subclassification is not possible. If clinically feasible, an excisional biopsy is recommended.  Correlation with clinical and cytogenetic/molecular data is recommended.    12/11/18 Inguinal LN Biopsy  Interpretation Tissue-Flow Cytometry - MONOCLONAL B-CELL POPULATION EXPRESSING CD10 COMPRISES 92% OF ALL LYMPHOCYTES - SEE COMMENT Microscopic Gated population: Flow cytometric immunophenotyping is performed using antibiodies to the antigens listed in the table below. Electronic gates are placed around a cell cluster displaying light scatter properties corresponding to lymphocytes. - Abnormal Cells in gated population: 92 % - Phenotype of Abnormal Cells: CD10, CD19, CD20, CD21, CD22, HLA-Dr, Kappa Diagnosis Lymph node for lymphoma, Deep Right Inguinal - FOLLICULAR LYMPHOMA, GRADE 1-2 - SEE COMMENT Microscopic Comment The nodal architecture is effaced by a proliferation back to back follicles composed of medium to large lymphocytes with irregular, cleaved nuclei and scattered centroblasts. There are no diffuse areas of growth present within the examined nodal material. Immunohistochemistry performed on the tissue highlights a nodular B-cell proliferation with expression of CD20,  CD10, bcl-6, and Bcl-2. CD21 highlights expanded follicular dendritic meshworks. The Ki-67 shows a slightly increased proliferation rate of 30-40%. The atypical lymphocytes are negative for CD5. CD3 immunohistochemistry highlights the background T-cells. Flow cytometry was performed. A monoclonal B-cell populaton expressing CD10 comprises 92% of all lymphocytes (See [phone]). Overall, the findings are consistent with a diagnosis of low grade follicular lymphoma, Grade 1-2. Of note, the increased ki-67 may portend a more aggressive course despite the low histologic grade.   01/18/19 BM Biopsy:   05/05/19 BM Biopsy:   10/22/202 CT CHEST, ABDOMEN, AND PELVIS  WITH CONTRAST (Accession [id number])    RADIOGRAPHIC STUDIES: I have personally reviewed the radiological images as listed and agreed with the findings in the Stewart. No results found.    ASSESSMENT & PLAN:   Ethan Stewart is a 74 y.o. male with:  1) Diffuse large B-cell lymphoma stage IV AE with involvement of mesenteric, inguinal lymph nodes and C5 vertebral body with spinal cord impingement  Patient is status post six cycles of R-CHOP and R-mini-CHOP, completed in 2016, as noted above.  2) History of capillary leak syndrome with ARDS requiring intubation likely due to G-CSF.  3)  Follicular Lymphoma, Grade 1-2 currently on Revlimid + Rituxan  11/02/18 CT A/P which revealed Negative for inguinal or bowel hernia but positive for bulky right inguinal and external iliac lymphadenopathy which is new since a 2017 PET-CT and highly suspicious for recurrent Lymphoma. This would be amenable to Ultrasound-guided needle biopsy. 2. No abdominal or contralateral left hemi pelvic lymphadenopathy. No other acute findings identified in the abdomen or pelvis. 3. Chronic pneumobilia.  Aortic Atherosclerosis.  11/12/18 PET/CT which revealed  Interval development of multistation lymphadenopathy in the abdomen and pelvis as detailed above- [place]ville 5. 2. Presence of pneumobilia could reflect sphincter of Oddi dysfunction versus recent procedure. Correlate with clinical history and upcoming comprehensive metabolic panel. 3. Ancillary CT findings   11/27/18 US guided LN Biopsy pathology Stewart is as noted above. " B-cell lymphoma with a germinal center phenotype (GC-type), with focal follicular architecture and a low proliferation index. Given the nature of the sample (i.e. core biopsy) a final architectural subclassification is not possible. If clinically feasible, an excisional biopsy is recommended."  12/11/18 Right Inguinal LN biopsy revealed Grade 1-2 Follicular Lymphoma  01/25/28 CT A/P revealed  Today's study demonstrates clear progression of disease with increased number and size of numerous enlarged lymph nodes throughout the lower thorax, abdomen and pelvis, as detailed above. 2. In addition, there are several small pulmonary nodules in the lung bases. The largest of these are clustered in the posterior aspect of the right lower lobe in an apparent area of scarring which is very similar to prior PET-CT 03/12/2016, favored to be benign. However, the smaller nodules which measure up to 6 mm in size are nonspecific and warrant attention on follow-up studies. 3. Aortic atherosclerosis, in addition to least 2 vessel coronary artery disease. Assessment for potential risk factor modification, dietary therapy or pharmacologic therapy may be warranted, if clinically indicated. 4. Additional incidental findings, as above. 01/18/19 BM Bx revealed normocellular bone marrow with trilineage hematopoiesis Stage II indicated with 01/26/19 CT  Began C1 of 52m Revlimid and Rituxan on 02/05/19  05/05/19 BM Bx revealed normocellular bone marrow with erythroid hyperplasia, and no evidence of lymphoma  05/11/19 PET/CT revealed "Near complete metabolic response to therapy of lymphoma since the prior PET of 11/12/2018. Low-level hypermetabolism at the site of adenopathy within the abdominal retroperitoneum  persists. ([place]) 2. There has also been resolution of abdominopelvic adenopathy since the diagnostic CT of 01/26/2019. 2. Nonspecific right lower lobe pulmonary nodules, similar. 3. Coronary artery atherosclerosis. Aortic Atherosclerosis."   10/14/2019 CT C/A/P ([id number]) revealed " 1. No findings of active lymphoma. 2. Pulmonary nodules in the right lung are mostly stable from March  2017, and considered benign, although one 5 mm right lower lobe nodule medially on image 115/6 is stable of the last year but not well seen on 03/12/2016 and may merit surveillance. 3. Reduced prominence of treatment related  stranding in the left periaortic region likely the residua of prior adenopathy. 4. Other imaging findings of potential clinical significance:Coronary atherosclerosis. Mild cardiomegaly. Cholelithiasis. Mild prostatomegaly. Mild lumbar spondylosis and degenerative disc disease causing mild impingement at L3-4 and L4-5. Small left renal peripelvic cysts. Prior bilateral groin hernia repairs. Pneumobilia, query prior sphincterotomy."  05/31/2020 CT C/A/P ([id number]) revealed "1. No lymphadenopathy in the chest, abdomen, or pelvis. No signs of disease recurrence. 2. Stable appearance of small RIGHT pulmonary nodules."  4) History of right lower extremity distal DVT. Resolved. S/p 6 months of treatment in 02/2016 -back on 47m Xarelto while taking Revlimid  5) Mild chronic thrombocytopenia-  128k   PLAN: -Discussed pt labwork today, 12/14/20; blood counts and chemistries look good, LDH is WNL. -No lab or clinical evidence of Follicular Lymphoma progression/recurrence.  -No lab or clinical evidence of his Large B-cell Lymphoma progression/recurrence at this time.  -The pt has no prohibitive toxicities from continuing maintenance Rituxan every two months at this time. -Will see back in 2 months with labs     FOLLOW UP: Follow-up as scheduled for next labs MD visit and maintenance Rituxan on 02/14/2021   The total time spent in the appt was 20 minutes and more than 50% was on counseling and direct patient cares.  All of the patient's questions were answered with apparent satisfaction. The patient knows to call the clinic with any problems, questions or concerns.   GSullivan LoneMD MRed RockHematology/Oncology Physician CLiber[facility] (Office):       [phone](Work cell):  [phone](Fax):           [phone] I, JYevette Edwards am acting as a scribe for Dr. GSullivan Lone   .I have reviewed the above documentation for accuracy and completeness, and I agree with the  above. .Brunetta GeneraMD

## 2020-12-21 ENCOUNTER — Telehealth: Payer: Self-pay | Admitting: Hematology

## 2020-12-21 NOTE — Telephone Encounter (Signed)
Release: [id number] Faxed medical records to [facility] Affairs @ fax# [phone]

## 2021-01-24 DIAGNOSIS — N400 Enlarged prostate without lower urinary tract symptoms: Secondary | ICD-10-CM | POA: Diagnosis not present

## 2021-01-24 DIAGNOSIS — E038 Other specified hypothyroidism: Secondary | ICD-10-CM | POA: Diagnosis not present

## 2021-01-24 DIAGNOSIS — E7800 Pure hypercholesterolemia, unspecified: Secondary | ICD-10-CM | POA: Diagnosis not present

## 2021-02-01 DIAGNOSIS — R69 Illness, unspecified: Secondary | ICD-10-CM | POA: Diagnosis not present

## 2021-02-01 DIAGNOSIS — Z886 Allergy status to analgesic agent status: Secondary | ICD-10-CM | POA: Diagnosis not present

## 2021-02-01 DIAGNOSIS — Z87892 Personal history of anaphylaxis: Secondary | ICD-10-CM | POA: Diagnosis not present

## 2021-02-01 DIAGNOSIS — G4700 Insomnia, unspecified: Secondary | ICD-10-CM | POA: Diagnosis not present

## 2021-02-01 DIAGNOSIS — N400 Enlarged prostate without lower urinary tract symptoms: Secondary | ICD-10-CM | POA: Diagnosis not present

## 2021-02-01 DIAGNOSIS — C8590 Non-Hodgkin lymphoma, unspecified, unspecified site: Secondary | ICD-10-CM | POA: Diagnosis not present

## 2021-02-01 DIAGNOSIS — E039 Hypothyroidism, unspecified: Secondary | ICD-10-CM | POA: Diagnosis not present

## 2021-02-01 DIAGNOSIS — E785 Hyperlipidemia, unspecified: Secondary | ICD-10-CM | POA: Diagnosis not present

## 2021-02-13 NOTE — Progress Notes (Signed)
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 4) left neck basal cell carcinoma- removed recently  Current treatment - Revlimid + Rituxan for follicular lymphoma  PreviousTreatment:  -Status post 6 cycles of R CHOP (dose reductions for cycle 2-3)  -Cannot use G-CSF due to issues with severe G-CSF related ARDS after cycle 1 (requiring prolonged intubation and ventilatory support)   ONCOLOGIC HISTORY  Diffuse large B-cell lymphoma stage IV AE with involvement of mesenteric, inguinal lymph nodes and C5 vertebral body with spinal cord impingement   He had a C5 corpectomy on 07/26/2015 that showed diffuse large B-cell lymphoma with a Ki-67 ranging from 20 to 90%. Cytogenetics and Fish showed BCL 2 positivity but negative for BCL 6 and cMYC CD10 positivity suggestive of possibly GCB subtype.  Patient received first cycle of R CHOP on 08/08/2015 and intrathecal methotrexate with hydrocortisone on 08/09/2015. Patient subsequently was admitted with ARDS likely due to neulasta -treated with high dose steroids and empirically with broad spectrum antibiotics.  2nd cycle of chemotherapy delayed to allow for rehabilitation (was due on 08/31/2015).  Received R-mini-CHOP for cycle 2 and tolerated it without significant cytopenias. Was admitted briefly for a mild lower extremity cellulitis and DVT. Cellulitis now resolved. Patient completing his oral antibiotics.  PET/CT scan on 09/29/2015 with no evidence of disease progression. Good response in his mesenteric lymph nodes. Inguinal lymph  nodes on the right side. Decreased uptake in his C5 level.  No new lesions noted.   Cycle 3 (10/02/2015)  of R- CHOP ( we increased the doxorubicin dose back to 50 mg/m and keep the cyclophosphamide dose reduction at 400 mg/m]  Cycle 4 (10/23/2015) of R-CHOP ( we increased the doxorubicin dose back to 50 mg/m and kept the cyclophosphamide dose reduction at 400 mg/m].  PET/CT 11/10/2015: Shows improvement in most lesions but is noted to have a new FDG avid lymph node in the mesentery of the small bowel.  Cycle 5 on 11/13/2015 R CHOP - received full dose.  Cycle 6 on 12/11/2015 R-CHOP full dose.  Admitted with neutropenic fevers and treatment for possible early pneumonia.  This resolved and he was able to discharge home  01/09/2016 through 01/22/2016: The patient was treated to the C4-C6 levels of the spine to a dose of 30 gray in 10 fractions using a 2 field technique. The patient was also treated to an abdominal lymph node region to a dose of 30 gray in 10 fractions using a 3 field technique. This treatment consisted of a 3-D conformal technique with daily image guidance utilized.   Repeat PET CT scan in March 12 2016 -showed some enlarged mesenteric lymph nodes which were FDG avid.  CT guided FNA of mesenteric lymph nodes at [facility] on 03/29/2016 - showed scant cellularity. No overt evidence of lymphoma.  Rpt PET/CT 05/15/2016 at [facility]- show similar appearing enlarged mesenteric lymph nodes which are FDG avid. No change in size and no other evidence of disease progression.  PET/CT 07/24/2016 - and [facility] shows no evidence of disease progression  PET/CT 01/22/2017:  Result Impression   1.Stable low-level metabolic activity in small lymph nodes in the small bowel mesentery. No new FDG avid disease. 2.Persistent hypermetabolic soft tissue thickening overlying the coccyx which could relate to a decubitus ulcer and should be amenable to  direct inspection. 3.Stable pulmonary nodules which do not appear hypermetabolic, however, several are below the size resolution of PET in size. Continued attention on follow-up is suggested. 4.Previously seen hypermetabolic skin thickening near the right ear has resolved. 5.Ancillary CT findings as above.    INTERVAL HISTORY:   Ethan Stewart is here for management and evaluation of his recently diagnosed Follicular Lymphoma, grade 1-2. Pt is here for maintenance Rituxan. The patient's last visit with [place] was on 12/14/2020. The pt reports that he is doing well overall.  The pt reports no new concerns or symptoms. He notes he has been eating well and there is no tingling or pain in his hands and arms related to his bothersome neck pain. This has been less bothersome recently. This bothers him intermittently, but denies it affecting his way of life. The pt notes no problems tolerating this at this time and desires a more aggressive approach. The pt is up to date on all vaccines.  Lab results today 02/14/2021 of CBC w/diff and CMP is as follows: all values are WNL except for WBC of 3.6K, Plt of 131K, Total Protein of 6.3. 02/14/2021 LDH of 181.  On review of systems, pt denies abdominal pain, acute back pain, leg swelling, and any other symptoms.  REVIEW OF SYSTEMS:   10 Point review of Systems was done is negative except as noted above.  ALLERGIES:  is allergic to neulasta [pegfilgrastim].  MEDICATIONS:  Current Outpatient Medications  Medication Sig Dispense Refill  . diazepam (VALIUM) 2 MG tablet Take 2 mg by mouth daily as needed for anxiety.    . ergocalciferol (VITAMIN D2) 1.25 MG (50000 UT) capsule Take 1 capsule (50,000 Units total) by mouth once a week. 12 capsule 4  . levothyroxine (SYNTHROID, LEVOTHROID) 88 MCG tablet Take 88 mcg by mouth daily before breakfast.   2  . Multiple Vitamin (MULTIVITAMIN WITH MINERALS) TABS tablet Take 1 tablet by mouth daily.     . simvastatin  (ZOCOR) 20 MG tablet Take 20 mg by mouth daily.    . tamsulosin (FLOMAX) 0.4 MG CAPS capsule Take 1 capsule (0.4 mg total) by mouth at bedtime. 30 capsule 3  . temazepam (RESTORIL) 15 MG capsule Take 15 mg by mouth at bedtime as needed for sleep.     . Vitamins/Minerals TABS Take by mouth.     No current facility-administered medications for this visit.    PHYSICAL EXAMINATION: ECOG FS:1 - Symptomatic but completely ambulatory  Vitals:   02/14/21 1040  BP: 137/88  Pulse: 63  Resp: 18  Temp: (!) 97 F (36.1 C)  SpO2: 99%   Wt Readings from Last 3 Encounters:  02/14/21 226 lb 1.6 oz (102.6 kg)  12/14/20 226 lb 9.6 oz (102.8 kg)  10/13/20 223 lb 12.8 oz (101.5 kg)   Body mass index is 29.83 kg/m.   GENERAL:alert, in no acute distress and comfortable SKIN: no acute rashes, no significant lesions EYES: conjunctiva are pink and non-injected, sclera anicteric OROPHARYNX: MMM, no exudates, no oropharyngeal erythema or ulceration NECK: supple, no JVD LYMPH:  no palpable lymphadenopathy in the cervical, axillary or inguinal regions LUNGS: clear to auscultation b/l with normal respiratory effort HEART: regular rate & rhythm ABDOMEN:  normoactive bowel sounds ,  non tender, not distended. Extremity: no pedal edema PSYCH: alert & oriented x 3 with fluent speech NEURO: no focal motor/sensory deficits   LABORATORY DATA:   CBC Latest Ref Rng & Units 02/14/2021 12/14/2020 10/13/2020  WBC 4.0 - 10.5 K/uL 3.6(L) 4.2 3.7(L)  Hemoglobin 13.0 - 17.0 g/dL 14.7 15.0 14.2  Hematocrit 39.0 - 52.0 % 41.9 44.1 40.2  Platelets 150 - 400 K/uL 131(L) 121(L) 129(L)     CMP Latest Ref Rng & Units 02/14/2021 12/14/2020 10/13/2020  Glucose 70 - 99 mg/dL 92 92 94  BUN 8 - 23 mg/dL '16 19 16  ' Creatinine 0.61 - 1.24 mg/dL 1.04 1.11 1.05  Sodium 135 - 145 mmol/L 141 142 139  Potassium 3.5 - 5.1 mmol/L 4.3 4.0 4.3  Chloride 98 - 111 mmol/L 109 109 109  CO2 22 - 32 mmol/L '24 27 26  ' Calcium 8.9 - 10.3  mg/dL 9.0 9.0 9.2  Total Protein 6.5 - 8.1 g/dL 6.3(L) 6.4(L) 6.1(L)  Total Bilirubin 0.3 - 1.2 mg/dL 0.6 0.6 0.5  Alkaline Phos 38 - 126 U/L 74 81 86  AST 15 - 41 U/L '29 24 27  ' ALT 0 - 44 U/L '27 28 24   ' Lab Results  Component Value Date   LDH 181 02/14/2021    Fine Needle Aspiration, Lymph NodeResulted: 12/04/2018 1:14 PM [facility] Result Narrative  ACCESSION NUMBER: [id number] RECEIVED: 11/27/2018 ORDERING PHYSICIAN: RAKHEE Megan Mans , MD PATIENT NAME: Ethan Stewart CYTOLOGY REPORT * Amended * Final Cytologic Interpretation AMENDED REPORT  AMENDED 12/04/2018 TO ADD DIAGNOSIS   A. Right inguinal lymph node, Fine Needle Aspiration II (smears and cell block): B-cell lymphoma with germinal center phenotype (see comment)  Specimen Adequacy: Satisfactory for evaluation.  B. Cytology core biopsy: B-cell lymphoma with germinal center phenotype (see comment)  Specimen Adequacy: Satisfactory for evaluation.    COMMENT:Sections of the core biopsy demonstrate an atypical vaguely nodular lymphoid infiltrate composed of medium sized lymphoid cells with mature chromatin, irregular nuclear membranes and scant cytoplasm. Focally some areas show larger cells. Few mitotic figures are present. Appropriately controlled immunohistochemical stains are performed. Sheets of B-cells are highlighted by CD20. These B-cells co-express BCL2 and BCL6 (focal, nodular distribution). They are negative for CD30 and MYC. CD21 demonstrates few residual disrupted follicular dendritic meshworks. The proliferation index is low (10-20%), as demonstrated by Ki-67. Scattered small T-cells are highlighted by CD3. Concurrent flow cytometry identified a kappa light chain restricted B-cell population, co-expressing CD10.  Overall, the findings are consistent with involvement by a B-cell lymphoma with a germinal center phenotype (GC-type), with  focal follicular architecture and a low proliferation index. Given the nature of the sample (i.e. core biopsy) a final architectural subclassification is not possible. If clinically feasible, an excisional biopsy is recommended.  Correlation with clinical and cytogenetic/molecular data is recommended.    12/11/18 Inguinal LN Biopsy  Interpretation Tissue-Flow Cytometry - MONOCLONAL B-CELL POPULATION EXPRESSING CD10 COMPRISES 92% OF ALL LYMPHOCYTES - SEE COMMENT Microscopic Gated population: Flow cytometric immunophenotyping is performed using antibiodies to the antigens listed in the table below. Electronic gates are placed around a cell cluster displaying light scatter properties corresponding to lymphocytes. - Abnormal Cells in gated population: 92 % - Phenotype of Abnormal Cells: CD10, CD19, CD20, CD21, CD22, HLA-Dr, Kappa Diagnosis Lymph node for lymphoma, Deep Right Inguinal - FOLLICULAR LYMPHOMA, GRADE 1-2 - SEE COMMENT Microscopic Comment The nodal architecture is effaced by a proliferation back to back follicles composed of medium  to large lymphocytes with irregular, cleaved nuclei and scattered centroblasts. There are no diffuse areas of growth present within the examined nodal material. Immunohistochemistry performed on the tissue highlights a nodular B-cell proliferation with expression of CD20, CD10, bcl-6, and Bcl-2. CD21 highlights expanded follicular dendritic meshworks. The Ki-67 shows a slightly increased proliferation rate of 30-40%. The atypical lymphocytes are negative for CD5. CD3 immunohistochemistry highlights the background T-cells. Flow cytometry was performed. A monoclonal B-cell populaton expressing CD10 comprises 92% of all lymphocytes (See [phone]). Overall, the findings are consistent with a diagnosis of low grade follicular lymphoma, Grade 1-2. Of note, the increased ki-67 may portend a more aggressive course despite the low histologic  grade.   01/18/19 BM Biopsy:   05/05/19 BM Biopsy:   10/22/202 CT CHEST, ABDOMEN, AND PELVIS WITH CONTRAST (Accession [id number])    RADIOGRAPHIC STUDIES: I have personally reviewed the radiological images as listed and agreed with the findings in the report. No results found.    ASSESSMENT & PLAN:   Ethan Stewart is a 75 y.o. male with:  1) Diffuse large B-cell lymphoma stage IV AE with involvement of mesenteric, inguinal lymph nodes and C5 vertebral body with spinal cord impingement  Patient is status post six cycles of R-CHOP and R-mini-CHOP, completed in 2016, as noted above.  2) History of capillary leak syndrome with ARDS requiring intubation likely due to G-CSF.  3)  Follicular Lymphoma, Grade 1-2 currently on Revlimid + Rituxan  11/02/18 CT A/P which revealed Negative for inguinal or bowel hernia but positive for bulky right inguinal and external iliac lymphadenopathy which is new since a 2017 PET-CT and highly suspicious for recurrent Lymphoma. This would be amenable to Ultrasound-guided needle biopsy. 2. No abdominal or contralateral left hemi pelvic lymphadenopathy. No other acute findings identified in the abdomen or pelvis. 3. Chronic pneumobilia.  Aortic Atherosclerosis.  11/12/18 PET/CT which revealed  Interval development of multistation lymphadenopathy in the abdomen and pelvis as detailed above- [place] 5. 2. Presence of pneumobilia could reflect sphincter of Oddi dysfunction versus recent procedure. Correlate with clinical history and upcoming comprehensive metabolic panel. 3. Ancillary CT findings   11/27/18 US guided LN Biopsy pathology report is as noted above. " B-cell lymphoma with a germinal center phenotype (GC-type), with focal follicular architecture and a low proliferation index. Given the nature of the sample (i.e. core biopsy) a final architectural subclassification is not possible. If clinically feasible, an excisional biopsy is  recommended."  12/11/18 Right Inguinal LN biopsy revealed Grade 1-2 Follicular Lymphoma  12/28/15 CT A/P revealed Today's study demonstrates clear progression of disease with increased number and size of numerous enlarged lymph nodes throughout the lower thorax, abdomen and pelvis, as detailed above. 2. In addition, there are several small pulmonary nodules in the lung bases. The largest of these are clustered in the posterior aspect of the right lower lobe in an apparent area of scarring which is very similar to prior PET-CT 03/12/2016, favored to be benign. However, the smaller nodules which measure up to 6 mm in size are nonspecific and warrant attention on follow-up studies. 3. Aortic atherosclerosis, in addition to least 2 vessel coronary artery disease. Assessment for potential risk factor modification, dietary therapy or pharmacologic therapy may be warranted, if clinically indicated. 4. Additional incidental findings, as above. 01/18/19 BM Bx revealed normocellular bone marrow with trilineage hematopoiesis Stage II indicated with 01/26/19 CT  Began C1 of 33m Revlimid and Rituxan on 02/05/19  05/05/19 BM Bx revealed normocellular bone marrow  with erythroid hyperplasia, and no evidence of lymphoma  05/11/19 PET/CT revealed "Near complete metabolic response to therapy of lymphoma since the prior PET of 11/12/2018. Low-level hypermetabolism at the site of adenopathy within the abdominal retroperitoneum persists. ([place]) 2. There has also been resolution of abdominopelvic adenopathy since the diagnostic CT of 01/26/2019. 2. Nonspecific right lower lobe pulmonary nodules, similar. 3. Coronary artery atherosclerosis. Aortic Atherosclerosis."   10/14/2019 CT C/A/P ([id number]) revealed " 1. No findings of active lymphoma. 2. Pulmonary nodules in the right lung are mostly stable from March  2017, and considered benign, although one 5 mm right lower lobe nodule medially on image 115/6 is stable of the last  year but not well seen on 03/12/2016 and may merit surveillance. 3. Reduced prominence of treatment related stranding in the left periaortic region likely the residua of prior adenopathy. 4. Other imaging findings of potential clinical significance:Coronary atherosclerosis. Mild cardiomegaly. Cholelithiasis. Mild prostatomegaly. Mild lumbar spondylosis and degenerative disc disease causing mild impingement at L3-4 and L4-5. Small left renal peripelvic cysts. Prior bilateral groin hernia repairs. Pneumobilia, query prior sphincterotomy."  05/31/2020 CT C/A/P ([id number]) revealed "1. No lymphadenopathy in the chest, abdomen, or pelvis. No signs of disease recurrence. 2. Stable appearance of small RIGHT pulmonary nodules."  4) History of right lower extremity distal DVT. Resolved. S/p 6 months of treatment in 02/2016 -back on 36m Xarelto while taking Revlimid  5) Mild chronic thrombocytopenia-  128k   PLAN: -Discussed pt labwork today, 02/14/2021; blood counts and chemistries very stable. LDH normal. -Advised pt he is currently almost 2 years on maintenance treatment (April).  -Advised pt we will get repeat scans following completion of two year mark. Discussed potential of continuing maintenance or just monitoring it after this. The pt desires to continue on Rituxan for a third year. -Discussed current research of 2 year versus 3 years of maintenance Rituxan in regard to different Lymphomas.  -Advised pt that relapse if occurs time would likely [facility] with bothersome disease. -Discussed [place] and pt's eligibility. The pt wishes to receive further information regarding this. -No lab or clinical evidence of his Large B-cell Lymphoma progression/recurrence at this time.  -The pt has no prohibitive toxicities from continuing maintenance Rituxan every two months at this time. Will continue for a third year of maintenance. Will hold off on scans at this time. -Will see  back in 2 months with labs.     FOLLOW UP: -Plz schedule next 2 cycles of maintenance Rituxan with labs and MD visit as ordered every 2 months.   The total time spent in the appointment was 30 minutes and more than 50% was on counseling and direct patient cares, ordering and mx of Immunotherapy  All of the patient's questions were answered with apparent satisfaction. The patient knows to call the clinic with any problems, questions or concerns.   GSullivan LoneMD MBarton HillsHematology/Oncology Physician [facility]ospital [facility] (Office):       [phone](Work cell):  [phone](Fax):           [phone] I, RReinaldo Raddle am acting as scribe for Dr. GSullivan Lone MD.    .I have reviewed the above documentation for accuracy and completeness, and I agree with the above. .Brunetta GeneraMD

## 2021-02-14 ENCOUNTER — Other Ambulatory Visit: Payer: Self-pay

## 2021-02-14 ENCOUNTER — Inpatient Hospital Stay: Payer: No Typology Code available for payment source

## 2021-02-14 ENCOUNTER — Inpatient Hospital Stay (HOSPITAL_BASED_OUTPATIENT_CLINIC_OR_DEPARTMENT_OTHER): Payer: No Typology Code available for payment source | Admitting: Hematology

## 2021-02-14 ENCOUNTER — Inpatient Hospital Stay: Payer: No Typology Code available for payment source | Attending: Hematology

## 2021-02-14 VITALS — BP 137/88 | HR 63 | Temp 97.0°F | Resp 18 | Ht 73.0 in | Wt 226.1 lb

## 2021-02-14 VITALS — BP 132/70 | HR 59 | Resp 18

## 2021-02-14 DIAGNOSIS — C8330 Diffuse large B-cell lymphoma, unspecified site: Secondary | ICD-10-CM

## 2021-02-14 DIAGNOSIS — Z298 Encounter for other specified prophylactic measures: Secondary | ICD-10-CM | POA: Diagnosis not present

## 2021-02-14 DIAGNOSIS — Z5112 Encounter for antineoplastic immunotherapy: Secondary | ICD-10-CM | POA: Diagnosis present

## 2021-02-14 DIAGNOSIS — Z79899 Other long term (current) drug therapy: Secondary | ICD-10-CM | POA: Diagnosis not present

## 2021-02-14 DIAGNOSIS — C8298 Follicular lymphoma, unspecified, lymph nodes of multiple sites: Secondary | ICD-10-CM

## 2021-02-14 LAB — CMP (CANCER CENTER ONLY)
ALT: 27 U/L (ref 0–44)
AST: 29 U/L (ref 15–41)
Albumin: 4.2 g/dL (ref 3.5–5.0)
Alkaline Phosphatase: 74 U/L (ref 38–126)
Anion gap: 8 (ref 5–15)
BUN: 16 mg/dL (ref 8–23)
CO2: 24 mmol/L (ref 22–32)
Calcium: 9 mg/dL (ref 8.9–10.3)
Chloride: 109 mmol/L (ref 98–111)
Creatinine: 1.04 mg/dL (ref 0.61–1.24)
GFR, Estimated: >60 mL/min (ref >60)
Glucose, Bld: 92 mg/dL (ref 70–99)
Potassium: 4.3 mmol/L (ref 3.5–5.1)
Sodium: 141 mmol/L (ref 135–145)
Total Bilirubin: 0.6 mg/dL (ref 0.3–1.2)
Total Protein: 6.3 g/dL — ABNORMAL LOW (ref 6.5–8.1)

## 2021-02-14 LAB — CBC WITH DIFFERENTIAL/PLATELET
Abs Immature Granulocytes: 0.01 10*3/uL (ref 0.00–0.07)
Basophils Absolute: 0 10*3/uL (ref 0.0–0.1)
Basophils Relative: 1 %
Eosinophils Absolute: 0.1 10*3/uL (ref 0.0–0.5)
Eosinophils Relative: 2 %
HCT: 41.9 % (ref 39.0–52.0)
Hemoglobin: 14.7 g/dL (ref 13.0–17.0)
Immature Granulocytes: 0 %
Lymphocytes Relative: 25 %
Lymphs Abs: 0.9 10*3/uL (ref 0.7–4.0)
MCH: 30.5 pg (ref 26.0–34.0)
MCHC: 35.1 g/dL (ref 30.0–36.0)
MCV: 86.9 fL (ref 80.0–100.0)
Monocytes Absolute: 0.5 10*3/uL (ref 0.1–1.0)
Monocytes Relative: 14 %
Neutro Abs: 2.1 10*3/uL (ref 1.7–7.7)
Neutrophils Relative %: 58 %
Platelets: 131 10*3/uL — ABNORMAL LOW (ref 150–400)
RBC: 4.82 MIL/uL (ref 4.22–5.81)
RDW: 12.9 % (ref 11.5–15.5)
WBC: 3.6 10*3/uL — ABNORMAL LOW (ref 4.0–10.5)
nRBC: 0 % (ref 0.0–0.2)

## 2021-02-14 LAB — LACTATE DEHYDROGENASE: LDH: 181 U/L (ref 98–192)

## 2021-02-14 MED ORDER — DIPHENHYDRAMINE HCL 25 MG PO CAPS
50.0000 mg | ORAL_CAPSULE | Freq: Once | ORAL | Status: AC
  Administered 2021-02-14: 50 mg via ORAL

## 2021-02-14 MED ORDER — SODIUM CHLORIDE 0.9 % IV SOLN
375.0000 mg/m2 | Freq: Once | INTRAVENOUS | Status: AC
  Administered 2021-02-14: 900 mg via INTRAVENOUS
  Filled 2021-02-14: qty 50

## 2021-02-14 MED ORDER — DIPHENHYDRAMINE HCL 25 MG PO CAPS
ORAL_CAPSULE | ORAL | Status: AC
  Filled 2021-02-14: qty 2

## 2021-02-14 MED ORDER — METHYLPREDNISOLONE SODIUM SUCC 125 MG IJ SOLR
125.0000 mg | Freq: Once | INTRAMUSCULAR | Status: AC
  Administered 2021-02-14: 125 mg via INTRAVENOUS

## 2021-02-14 MED ORDER — ACETAMINOPHEN 325 MG PO TABS
650.0000 mg | ORAL_TABLET | Freq: Once | ORAL | Status: AC
  Administered 2021-02-14: 650 mg via ORAL

## 2021-02-14 MED ORDER — ACETAMINOPHEN 325 MG PO TABS
ORAL_TABLET | ORAL | Status: AC
  Filled 2021-02-14: qty 2

## 2021-02-14 MED ORDER — SODIUM CHLORIDE 0.9 % IV SOLN
Freq: Once | INTRAVENOUS | Status: AC
Start: 2021-02-14 — End: 2021-02-14
  Filled 2021-02-14: qty 250

## 2021-02-14 MED ORDER — METHYLPREDNISOLONE SODIUM SUCC 125 MG IJ SOLR
INTRAMUSCULAR | Status: AC
  Filled 2021-02-14: qty 2

## 2021-02-14 NOTE — Patient Instructions (Signed)
[place] Discharge Instructions for Patients Receiving Chemotherapy  Today you received the following chemotherapy agents: Rituxumab (Rituxan)  To help prevent nausea and vomiting after your treatment, we encourage you to take your nausea medication  as prescribed.    If you develop nausea and vomiting that is not controlled by your nausea medication, call the clinic.   BELOW ARE SYMPTOMS THAT SHOULD BE REPORTED IMMEDIATELY:  *FEVER GREATER THAN 100.5 F  *CHILLS WITH OR WITHOUT FEVER  NAUSEA AND VOMITING THAT IS NOT CONTROLLED WITH YOUR NAUSEA MEDICATION  *UNUSUAL SHORTNESS OF BREATH  *UNUSUAL BRUISING OR BLEEDING  TENDERNESS IN MOUTH AND THROAT WITH OR WITHOUT PRESENCE OF ULCERS  *URINARY PROBLEMS  *BOWEL PROBLEMS  UNUSUAL RASH Items with * indicate a potential emergency and should be followed up as soon as possible.  Feel free to call the clinic should you have any questions or concerns. The clinic phone number is [phone].  Please show the [place] at check-in to the Emergency Department and triage nurse.

## 2021-04-04 DIAGNOSIS — G479 Sleep disorder, unspecified: Secondary | ICD-10-CM | POA: Diagnosis not present

## 2021-04-04 DIAGNOSIS — R69 Illness, unspecified: Secondary | ICD-10-CM | POA: Diagnosis not present

## 2021-04-05 DIAGNOSIS — E7800 Pure hypercholesterolemia, unspecified: Secondary | ICD-10-CM | POA: Diagnosis not present

## 2021-04-05 DIAGNOSIS — N400 Enlarged prostate without lower urinary tract symptoms: Secondary | ICD-10-CM | POA: Diagnosis not present

## 2021-04-05 DIAGNOSIS — E038 Other specified hypothyroidism: Secondary | ICD-10-CM | POA: Diagnosis not present

## 2021-04-17 NOTE — Progress Notes (Signed)
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 4) left neck basal cell carcinoma- removed recently  Current treatment - Revlimid + Rituxan for follicular lymphoma  PreviousTreatment:  -Status post 6 cycles of R CHOP (dose reductions for cycle 2-3)  -Cannot use G-CSF due to issues with severe G-CSF related ARDS after cycle 1 (requiring prolonged intubation and ventilatory support)   ONCOLOGIC HISTORY  Diffuse large B-cell lymphoma stage IV AE with involvement of mesenteric, inguinal lymph nodes and C5 vertebral body with spinal cord impingement   He had a C5 corpectomy on 07/26/2015 that showed diffuse large B-cell lymphoma with a Ki-67 ranging from 20 to 90%. Cytogenetics and Fish showed BCL 2 positivity but negative for BCL 6 and cMYC CD10 positivity suggestive of possibly GCB subtype.  Patient received first cycle of R CHOP on 08/08/2015 and intrathecal methotrexate with hydrocortisone on 08/09/2015. Patient subsequently was admitted with ARDS likely due to neulasta -treated with high dose steroids and empirically with broad spectrum antibiotics.  2nd cycle of chemotherapy delayed to allow for rehabilitation (was due on 08/31/2015).  Received R-mini-CHOP for cycle 2 and tolerated it without significant cytopenias. Was admitted briefly for a mild lower extremity cellulitis and DVT. Cellulitis now resolved. Patient completing his oral antibiotics.  PET/CT scan on 09/29/2015 with no evidence of disease progression. Good response in his mesenteric lymph nodes. Inguinal lymph  nodes on the right side. Decreased uptake in his C5 level.  No new lesions noted.   Cycle 3 (10/02/2015)  of R- CHOP ( we increased the doxorubicin dose back to 50 mg/m and keep the cyclophosphamide dose reduction at 400 mg/m]  Cycle 4 (10/23/2015) of R-CHOP ( we increased the doxorubicin dose back to 50 mg/m and kept the cyclophosphamide dose reduction at 400 mg/m].  PET/CT 11/10/2015: Shows improvement in most lesions but is noted to have a new FDG avid lymph node in the mesentery of the small bowel.  Cycle 5 on 11/13/2015 R CHOP - received full dose.  Cycle 6 on 12/11/2015 R-CHOP full dose.  Admitted with neutropenic fevers and treatment for possible early pneumonia.  This resolved and he was able to discharge home  01/09/2016 through 01/22/2016: The patient was treated to the C4-C6 levels of the spine to a dose of 30 gray in 10 fractions using a 2 field technique. The patient was also treated to an abdominal lymph node region to a dose of 30 gray in 10 fractions using a 3 field technique. This treatment consisted of a 3-D conformal technique with daily image guidance utilized.   Repeat PET CT scan in March 12 2016 -showed some enlarged mesenteric lymph nodes which were FDG avid.  CT guided FNA of mesenteric lymph nodes at [facility] on 03/29/2016 - showed scant cellularity. No overt evidence of lymphoma.  Rpt PET/CT 05/15/2016 at [facility]- show similar appearing enlarged mesenteric lymph nodes which are FDG avid. No change in size and no other evidence of disease progression.  PET/CT 07/24/2016 - and [facility] shows no evidence of disease progression  PET/CT 01/22/2017:  Result Impression   1.Stable low-level metabolic activity in small lymph nodes in the small bowel mesentery. No new FDG avid disease. 2.Persistent hypermetabolic soft tissue thickening overlying the coccyx which could relate to a decubitus ulcer and should be amenable to  direct inspection. 3.Stable pulmonary nodules which do not appear hypermetabolic, however, several are below the size resolution of PET in size. Continued attention on follow-up is suggested. 4.Previously seen hypermetabolic skin thickening near the right ear has resolved. 5.Ancillary CT findings as above.    INTERVAL HISTORY:   Ethan Stewart is here for management and evaluation of his recently diagnosed Follicular Lymphoma, grade 1-2. Pt is here for maintenance Rituxan. The patient's last visit with [place] was on 02/14/2021. The pt reports that he is doing well overall.  The pt reports no new symptoms or concerns. The pt notes no issues tolerating the continued Rituxan. The pt notes he has been staying active by playing golf, fishing, and working in his garden. The pt noted that he received his second COVID booster shot around three weeks ago.  Lab results today 04/18/2021 of CBC w/diff and CMP is as follows: all values are WNL except for WBC of 3.8K, Plt of 129K, Total Protein of 6.2. 04/18/2021 LDH of 180.  On review of systems, pt denies fevers, chills, new lumps/bumps,leg swelling, abdominal pain, back pain, and any other symptoms.  REVIEW OF SYSTEMS:   10 Point review of Systems was done is negative except as noted above.  ALLERGIES:  is allergic to pegfilgrastim.  MEDICATIONS:  Current Outpatient Medications  Medication Sig Dispense Refill  . cholecalciferol (VITAMIN D3) 25 MCG (1000 UNIT) tablet 1 tablet    . diazepam (VALIUM) 2 MG tablet Take 2 mg by mouth daily as needed for anxiety.    . ergocalciferol (VITAMIN D2) 1.25 MG (50000 UT) capsule Take 1 capsule (50,000 Units total) by mouth once a week. 12 capsule 4  . levothyroxine (SYNTHROID, LEVOTHROID) 88 MCG tablet Take 88 mcg by mouth daily before breakfast.   2  . Multiple Vitamins-Minerals (CENTRUM SILVER PO) Take by mouth daily.    . polyethylene glycol powder (GLYCOLAX/MIRALAX) 17 GM/SCOOP powder TAKE AS DIRECTED BY  MOUTH    . rivaroxaban (XARELTO) 10 MG TABS tablet Take 1 tablet by mouth daily.    . tamsulosin (FLOMAX) 0.4 MG CAPS capsule Take 1 capsule (0.4 mg total) by mouth at bedtime. 30 capsule 3  . temazepam (RESTORIL) 15 MG capsule Take 15 mg by mouth at bedtime as needed for sleep.     . simvastatin (ZOCOR) 20 MG tablet Take 20 mg by mouth daily.    . simvastatin (ZOCOR) 40 MG tablet Take 0.5 tablets by mouth at bedtime.     No current facility-administered medications for this visit.    PHYSICAL EXAMINATION: ECOG FS:1 - Symptomatic but completely ambulatory  Vitals:   04/18/21 0955  BP: (!) 153/90  Pulse: (!) 52  Resp: 18  Temp: (!) 97 F (36.1 C)  SpO2: 100%   Wt Readings from Last 3 Encounters:  04/18/21 224 lb (101.6 kg)  02/14/21 226 lb 1.6 oz (102.6 kg)  12/14/20 226 lb 9.6 oz (102.8 kg)   Body mass index is 29.55 kg/m.    GENERAL:alert, in no acute distress and comfortable SKIN: no acute rashes, no significant lesions EYES: conjunctiva are pink and non-injected, sclera anicteric OROPHARYNX: MMM, no exudates, no oropharyngeal erythema or ulceration NECK: supple, no JVD LYMPH:  no palpable lymphadenopathy in the cervical,  axillary or inguinal regions LUNGS: clear to auscultation b/l with normal respiratory effort HEART: regular rate & rhythm ABDOMEN:  normoactive bowel sounds , non tender, not distended. Extremity: no pedal edema PSYCH: alert & oriented x 3 with fluent speech NEURO: no focal motor/sensory deficits   LABORATORY DATA:   CBC Latest Ref Rng & Units 04/18/2021 02/14/2021 12/14/2020  WBC 4.0 - 10.5 K/uL 3.8(L) 3.6(L) 4.2  Hemoglobin 13.0 - 17.0 g/dL 14.9 14.7 15.0  Hematocrit 39.0 - 52.0 % 42.1 41.9 44.1  Platelets 150 - 400 K/uL 129(L) 131(L) 121(L)     CMP Latest Ref Rng & Units 04/18/2021 02/14/2021 12/14/2020  Glucose 70 - 99 mg/dL 98 92 92  BUN 8 - 23 mg/dL '17 16 19  ' Creatinine 0.61 - 1.24 mg/dL 1.07 1.04 1.11  Sodium 135 - 145 mmol/L 142 141 142   Potassium 3.5 - 5.1 mmol/L 4.2 4.3 4.0  Chloride 98 - 111 mmol/L 109 109 109  CO2 22 - 32 mmol/L '25 24 27  ' Calcium 8.9 - 10.3 mg/dL 9.1 9.0 9.0  Total Protein 6.5 - 8.1 g/dL 6.2(L) 6.3(L) 6.4(L)  Total Bilirubin 0.3 - 1.2 mg/dL 0.6 0.6 0.6  Alkaline Phos 38 - 126 U/L 71 74 81  AST 15 - 41 U/L '30 29 24  ' ALT 0 - 44 U/L '26 27 28   ' Lab Results  Component Value Date   LDH 180 04/18/2021    Fine Needle Aspiration, Lymph NodeResulted: 12/04/2018 1:14 PM [facility] Result Narrative  ACCESSION NUMBER: [id number] RECEIVED: 11/27/2018 ORDERING PHYSICIAN: RAKHEE Megan Mans , MD PATIENT NAME: Ethan Stewart * Amended * Final Cytologic Interpretation AMENDED Stewart  AMENDED 12/04/2018 TO ADD DIAGNOSIS   A. Right inguinal lymph node, Fine Needle Aspiration II (smears and cell block): B-cell lymphoma with germinal center phenotype (see comment)  Specimen Adequacy: Satisfactory for evaluation.  B. Cytology core biopsy: B-cell lymphoma with germinal center phenotype (see comment)  Specimen Adequacy: Satisfactory for evaluation.    COMMENT:Sections of the core biopsy demonstrate an atypical vaguely nodular lymphoid infiltrate composed of medium sized lymphoid cells with mature chromatin, irregular nuclear membranes and scant cytoplasm. Focally some areas show larger cells. Few mitotic figures are present. Appropriately controlled immunohistochemical stains are performed. Sheets of B-cells are highlighted by CD20. These B-cells co-express BCL2 and BCL6 (focal, nodular distribution). They are negative for CD30 and MYC. CD21 demonstrates few residual disrupted follicular dendritic meshworks. The proliferation index is low (10-20%), as demonstrated by Ki-67. Scattered small T-cells are highlighted by CD3. Concurrent flow cytometry identified a kappa light chain restricted B-cell population, co-expressing  CD10.  Overall, the findings are consistent with involvement by a B-cell lymphoma with a germinal center phenotype (GC-type), with focal follicular architecture and a low proliferation index. Given the nature of the sample (i.e. core biopsy) a final architectural subclassification is not possible. If clinically feasible, an excisional biopsy is recommended.  Correlation with clinical and cytogenetic/molecular data is recommended.    12/11/18 Inguinal LN Biopsy  Interpretation Tissue-Flow Cytometry - MONOCLONAL B-CELL POPULATION EXPRESSING CD10 COMPRISES 92% OF ALL LYMPHOCYTES - SEE COMMENT Microscopic Gated population: Flow cytometric immunophenotyping is performed using antibiodies to the antigens listed in the table below. Electronic gates are placed around a cell cluster displaying light scatter properties corresponding to lymphocytes. - Abnormal Cells in gated population: 92 % - Phenotype of Abnormal Cells: CD10, CD19, CD20, CD21, CD22, HLA-Dr, Kappa Diagnosis Lymph node for lymphoma, Deep Right Inguinal -  FOLLICULAR LYMPHOMA, GRADE 1-2 - SEE COMMENT Microscopic Comment The nodal architecture is effaced by a proliferation back to back follicles composed of medium to large lymphocytes with irregular, cleaved nuclei and scattered centroblasts. There are no diffuse areas of growth present within the examined nodal material. Immunohistochemistry performed on the tissue highlights a nodular B-cell proliferation with expression of CD20, CD10, bcl-6, and Bcl-2. CD21 highlights expanded follicular dendritic meshworks. The Ki-67 shows a slightly increased proliferation rate of 30-40%. The atypical lymphocytes are negative for CD5. CD3 immunohistochemistry highlights the background T-cells. Flow cytometry was performed. A monoclonal B-cell populaton expressing CD10 comprises 92% of all lymphocytes (See [phone]). Overall, the findings are consistent with a diagnosis of low grade  follicular lymphoma, Grade 1-2. Of note, the increased ki-67 may portend a more aggressive course despite the low histologic grade.   01/18/19 BM Biopsy:   05/05/19 BM Biopsy:   10/22/202 CT CHEST, ABDOMEN, AND PELVIS WITH CONTRAST (Accession [id number])    RADIOGRAPHIC STUDIES: I have personally reviewed the radiological images as listed and agreed with the findings in the Stewart. No results found.    ASSESSMENT & PLAN:   HELIOS KOHLMANN is a 75 y.o. male with:  1) Diffuse large B-cell lymphoma stage IV AE with involvement of mesenteric, inguinal lymph nodes and C5 vertebral body with spinal cord impingement  Patient is status post six cycles of R-CHOP and R-mini-CHOP, completed in 2016, as noted above.  2) History of capillary leak syndrome with ARDS requiring intubation likely due to G-CSF.  3)  Follicular Lymphoma, Grade 1-2 currently on Revlimid + Rituxan  11/02/18 CT A/P which revealed Negative for inguinal or bowel hernia but positive for bulky right inguinal and external iliac lymphadenopathy which is new since a 2017 PET-CT and highly suspicious for recurrent Lymphoma. This would be amenable to Ultrasound-guided needle biopsy. 2. No abdominal or contralateral left hemi pelvic lymphadenopathy. No other acute findings identified in the abdomen or pelvis. 3. Chronic pneumobilia.  Aortic Atherosclerosis.  11/12/18 PET/CT which revealed  Interval development of multistation lymphadenopathy in the abdomen and pelvis as detailed above- Deauville 5. 2. Presence of pneumobilia could reflect sphincter of Oddi dysfunction versus recent procedure. Correlate with clinical history and upcoming comprehensive metabolic panel. 3. Ancillary CT findings   11/27/18 US guided LN Biopsy pathology Stewart is as noted above. " B-cell lymphoma with a germinal center phenotype (GC-type), with focal follicular architecture and a low proliferation index. Given the nature of the sample (i.e. core  biopsy) a final architectural subclassification is not possible. If clinically feasible, an excisional biopsy is recommended."  12/11/18 Right Inguinal LN biopsy revealed Grade 1-2 Follicular Lymphoma  03/24/34 CT A/P revealed Today's study demonstrates clear progression of disease with increased number and size of numerous enlarged lymph nodes throughout the lower thorax, abdomen and pelvis, as detailed above. 2. In addition, there are several small pulmonary nodules in the lung bases. The largest of these are clustered in the posterior aspect of the right lower lobe in an apparent area of scarring which is very similar to prior PET-CT 03/12/2016, favored to be benign. However, the smaller nodules which measure up to 6 mm in size are nonspecific and warrant attention on follow-up studies. 3. Aortic atherosclerosis, in addition to least 2 vessel coronary artery disease. Assessment for potential risk factor modification, dietary therapy or pharmacologic therapy may be warranted, if clinically indicated. 4. Additional incidental findings, as above. 01/18/19 BM Bx revealed normocellular bone marrow with trilineage hematopoiesis  Stage II indicated with 01/26/19 CT  Began C1 of 37m Revlimid and Rituxan on 02/05/19  05/05/19 BM Bx revealed normocellular bone marrow with erythroid hyperplasia, and no evidence of lymphoma  05/11/19 PET/CT revealed "Near complete metabolic response to therapy of lymphoma since the prior PET of 11/12/2018. Low-level hypermetabolism at the site of adenopathy within the abdominal retroperitoneum persists. (Deauville) 2. There has also been resolution of abdominopelvic adenopathy since the diagnostic CT of 01/26/2019. 2. Nonspecific right lower lobe pulmonary nodules, similar. 3. Coronary artery atherosclerosis. Aortic Atherosclerosis."   10/14/2019 CT C/A/P ([id number] revealed " 1. No findings of active lymphoma. 2. Pulmonary nodules in the right lung are mostly stable from March   2017, and considered benign, although one 5 mm right lower lobe nodule medially on image 115/6 is stable of the last year but not well seen on 03/12/2016 and may merit surveillance. 3. Reduced prominence of treatment related stranding in the left periaortic region likely the residua of prior adenopathy. 4. Other imaging findings of potential clinical significance:Coronary atherosclerosis. Mild cardiomegaly. Cholelithiasis. Mild prostatomegaly. Mild lumbar spondylosis and degenerative disc disease causing mild impingement at L3-4 and L4-5. Small left renal peripelvic cysts. Prior bilateral groin hernia repairs. Pneumobilia, query prior sphincterotomy."  05/31/2020 CT C/A/P ([id number] revealed "1. No lymphadenopathy in the chest, abdomen, or pelvis. No signs of disease recurrence. 2. Stable appearance of small RIGHT pulmonary nodules."  4) History of right lower extremity distal DVT. Resolved. S/p 6 months of treatment in 02/2016 -back on 142mXarelto while taking Revlimid  5) Mild chronic thrombocytopenia-  128k   PLAN: -Discussed pt labwork today, 04/18/2021; blood counts and chemistries stable. LDH normal. -Recommended pt receive the second COVID booster shot as recently approved. Advised pt to wait 4-6 months following first booster shot before getting this. The pt notes he received this three weeks ago. -Discussed Evusheld and pt's eligibility. Will send referral.  -No lab or clinical evidence of his Large B-cell Lymphoma progression/recurrence at this time.  -The pt has no prohibitive toxicities from continuing maintenance Rituxan every two months at this time.  -Will see back in 2 months with labs a scheduled.     FOLLOW UP: F/u for next cycle of maintenance Rituxan with portflush and labs and MD visit as scheduled on 6/29 Refer for Evusheld   The total time spent in the appointment was 20 minutes and more than 50% was on counseling and direct patient cares.   All of the patient's  questions were answered with apparent satisfaction. The patient knows to call the clinic with any problems, questions or concerns.   GaSullivan LoneD MSCamptiematology/Oncology Physician CoTrails Edge Surgery Center LLC(Office):       [phone] cell):  [phone]):           [phone], RoReinaldo Raddleam acting as scribe for Dr. GaSullivan LoneMD.

## 2021-04-18 ENCOUNTER — Inpatient Hospital Stay: Payer: No Typology Code available for payment source | Attending: Hematology

## 2021-04-18 ENCOUNTER — Inpatient Hospital Stay: Payer: No Typology Code available for payment source

## 2021-04-18 ENCOUNTER — Inpatient Hospital Stay (HOSPITAL_BASED_OUTPATIENT_CLINIC_OR_DEPARTMENT_OTHER): Payer: No Typology Code available for payment source | Admitting: Hematology

## 2021-04-18 ENCOUNTER — Other Ambulatory Visit: Payer: Self-pay

## 2021-04-18 VITALS — BP 153/90 | HR 52 | Temp 97.0°F | Resp 18 | Wt 224.0 lb

## 2021-04-18 DIAGNOSIS — Z5112 Encounter for antineoplastic immunotherapy: Secondary | ICD-10-CM | POA: Diagnosis not present

## 2021-04-18 DIAGNOSIS — Z298 Encounter for other specified prophylactic measures: Secondary | ICD-10-CM

## 2021-04-18 DIAGNOSIS — C8298 Follicular lymphoma, unspecified, lymph nodes of multiple sites: Secondary | ICD-10-CM

## 2021-04-18 DIAGNOSIS — Z79899 Other long term (current) drug therapy: Secondary | ICD-10-CM | POA: Insufficient documentation

## 2021-04-18 DIAGNOSIS — C8330 Diffuse large B-cell lymphoma, unspecified site: Secondary | ICD-10-CM

## 2021-04-18 LAB — CBC WITH DIFFERENTIAL/PLATELET
Abs Immature Granulocytes: 0.01 10*3/uL (ref 0.00–0.07)
Basophils Absolute: 0 10*3/uL (ref 0.0–0.1)
Basophils Relative: 1 %
Eosinophils Absolute: 0.1 10*3/uL (ref 0.0–0.5)
Eosinophils Relative: 2 %
HCT: 42.1 % (ref 39.0–52.0)
Hemoglobin: 14.9 g/dL (ref 13.0–17.0)
Immature Granulocytes: 0 %
Lymphocytes Relative: 23 %
Lymphs Abs: 0.9 10*3/uL (ref 0.7–4.0)
MCH: 31.1 pg (ref 26.0–34.0)
MCHC: 35.4 g/dL (ref 30.0–36.0)
MCV: 87.9 fL (ref 80.0–100.0)
Monocytes Absolute: 0.5 10*3/uL (ref 0.1–1.0)
Monocytes Relative: 13 %
Neutro Abs: 2.3 10*3/uL (ref 1.7–7.7)
Neutrophils Relative %: 61 %
Platelets: 129 10*3/uL — ABNORMAL LOW (ref 150–400)
RBC: 4.79 MIL/uL (ref 4.22–5.81)
RDW: 13 % (ref 11.5–15.5)
WBC: 3.8 10*3/uL — ABNORMAL LOW (ref 4.0–10.5)
nRBC: 0 % (ref 0.0–0.2)

## 2021-04-18 LAB — CMP (CANCER CENTER ONLY)
ALT: 26 U/L (ref 0–44)
AST: 30 U/L (ref 15–41)
Albumin: 4.2 g/dL (ref 3.5–5.0)
Alkaline Phosphatase: 71 U/L (ref 38–126)
Anion gap: 8 (ref 5–15)
BUN: 17 mg/dL (ref 8–23)
CO2: 25 mmol/L (ref 22–32)
Calcium: 9.1 mg/dL (ref 8.9–10.3)
Chloride: 109 mmol/L (ref 98–111)
Creatinine: 1.07 mg/dL (ref 0.61–1.24)
GFR, Estimated: >60 mL/min (ref >60)
Glucose, Bld: 98 mg/dL (ref 70–99)
Potassium: 4.2 mmol/L (ref 3.5–5.1)
Sodium: 142 mmol/L (ref 135–145)
Total Bilirubin: 0.6 mg/dL (ref 0.3–1.2)
Total Protein: 6.2 g/dL — ABNORMAL LOW (ref 6.5–8.1)

## 2021-04-18 LAB — LACTATE DEHYDROGENASE: LDH: 180 U/L (ref 98–192)

## 2021-04-18 MED ORDER — SODIUM CHLORIDE 0.9 % IV SOLN
375.0000 mg/m2 | Freq: Once | INTRAVENOUS | Status: AC
  Administered 2021-04-18: 900 mg via INTRAVENOUS
  Filled 2021-04-18: qty 50

## 2021-04-18 MED ORDER — METHYLPREDNISOLONE SODIUM SUCC 125 MG IJ SOLR
INTRAMUSCULAR | Status: AC
  Filled 2021-04-18: qty 2

## 2021-04-18 MED ORDER — ACETAMINOPHEN 325 MG PO TABS
650.0000 mg | ORAL_TABLET | Freq: Once | ORAL | Status: AC
Start: 2021-04-18 — End: 2021-04-18
  Administered 2021-04-18: 650 mg via ORAL

## 2021-04-18 MED ORDER — SODIUM CHLORIDE 0.9 % IV SOLN
Freq: Once | INTRAVENOUS | Status: AC
  Filled 2021-04-18: qty 250

## 2021-04-18 MED ORDER — DIPHENHYDRAMINE HCL 25 MG PO CAPS
ORAL_CAPSULE | ORAL | Status: AC
  Filled 2021-04-18: qty 2

## 2021-04-18 MED ORDER — ACETAMINOPHEN 325 MG PO TABS
ORAL_TABLET | ORAL | Status: AC
  Filled 2021-04-18: qty 2

## 2021-04-18 MED ORDER — DIPHENHYDRAMINE HCL 25 MG PO CAPS
50.0000 mg | ORAL_CAPSULE | Freq: Once | ORAL | Status: AC
  Administered 2021-04-18: 50 mg via ORAL

## 2021-04-18 MED ORDER — METHYLPREDNISOLONE SODIUM SUCC 125 MG IJ SOLR
125.0000 mg | Freq: Once | INTRAMUSCULAR | Status: AC
  Administered 2021-04-18: 125 mg via INTRAVENOUS

## 2021-04-18 NOTE — Patient Instructions (Signed)
[facility] MEDICAL ONCOLOGY  Discharge Instructions: Thank you for choosing [facility] to provide your oncology and hematology care.   If you have a lab appointment with the Cancer Center, please go directly to the Cancer Center and check in at the registration area.   Wear comfortable clothing and clothing appropriate for easy access to any Portacath or PICC line.   We strive to give you quality time with your provider. You may need to reschedule your appointment if you arrive late (15 or more minutes).  Arriving late affects you and other patients whose appointments are after yours.  Also, if you miss three or more appointments without notifying the office, you may be dismissed from the clinic at the provider's discretion.      For prescription refill requests, have your pharmacy contact our office and allow 72 hours for refills to be completed.    Today you received the following chemotherapy and/or immunotherapy agents Rituxan      To help prevent nausea and vomiting after your treatment, we encourage you to take your nausea medication as directed.  BELOW ARE SYMPTOMS THAT SHOULD BE REPORTED IMMEDIATELY: *FEVER GREATER THAN 100.4 F (38 C) OR HIGHER *CHILLS OR SWEATING *NAUSEA AND VOMITING THAT IS NOT CONTROLLED WITH YOUR NAUSEA MEDICATION *UNUSUAL SHORTNESS OF BREATH *UNUSUAL BRUISING OR BLEEDING *URINARY PROBLEMS (pain or burning when urinating, or frequent urination) *BOWEL PROBLEMS (unusual diarrhea, constipation, pain near the anus) TENDERNESS IN MOUTH AND THROAT WITH OR WITHOUT PRESENCE OF ULCERS (sore throat, sores in mouth, or a toothache) UNUSUAL RASH, SWELLING OR PAIN  UNUSUAL VAGINAL DISCHARGE OR ITCHING   Items with * indicate a potential emergency and should be followed up as soon as possible or go to the Emergency Department if any problems should occur.  Please show the CHEMOTHERAPY ALERT CARD or IMMUNOTHERAPY ALERT CARD at check-in to the  Emergency Department and triage nurse.  Should you have questions after your visit or need to cancel or reschedule your appointment, please contact [facility] MEDICAL ONCOLOGY  Dept: [phone]  and follow the prompts.  Office hours are 8:00 a.m. to 4:30 p.m. Monday - Friday. Please note that voicemails left after 4:00 p.m. may not be returned until the following business day.  We are closed weekends and major holidays. You have access to a nurse at all times for urgent questions. Please call the main number to the clinic Dept: [phone] and follow the prompts.   For any non-urgent questions, you may also contact your provider using MyChart. We now offer e-Visits for anyone 18 and older to request care online for non-urgent symptoms. For details visit [email or web address].   Also download the MyChart app! Go to the app store, search "MyChart", open the app, select , and log in with your MyChart username and password.  Due to Covid, a mask is required upon entering the hospital/clinic. If you do not have a mask, one will be given to you upon arrival. For doctor visits, patients may have 1 support person aged 18 or older with them. For treatment visits, patients cannot have anyone with them due to current Covid guidelines and our immunocompromised population.   

## 2021-04-25 ENCOUNTER — Other Ambulatory Visit: Payer: Self-pay | Admitting: Physician Assistant

## 2021-04-25 ENCOUNTER — Telehealth: Payer: Self-pay | Admitting: Hematology

## 2021-04-25 DIAGNOSIS — C8330 Diffuse large B-cell lymphoma, unspecified site: Secondary | ICD-10-CM

## 2021-04-25 NOTE — Telephone Encounter (Signed)
Scheduled appt per 5/4 sch msg. Called pt, no answer. Left msg with appt date and time.  

## 2021-04-25 NOTE — Progress Notes (Signed)
I connected by phone with Ethan Stewart on 04/25/2021, 1:23 PM to discuss the potential use of a new treatment, tixagevimab/cilgavimab, for pre-exposure prophylaxis for prevention of coronavirus disease 2019 (COVID-19) caused by the SARS-CoV-2 virus.  This patient is a 75 y.o. male that meets the FDA criteria for Emergency Use Authorization of tixagevimab/cilgavimab for pre-exposure prophylaxis of COVID-19 disease. Pt meets following criteria:  Age >12 yr and weight > 40kg  Not currently infected with SARS-CoV-2 and has no known recent exposure to an individual infected with SARS-CoV-2 AND o Who has moderate to severe immune compromise due to a medical condition or receipt of immunosuppressive medications or treatments and may not mount an adequate immune response to COVID-19 vaccination or  o Vaccination with any available COVID-19 vaccine, according to the approved or authorized schedule, is not recommended due to a history of severe adverse reaction (e.g., severe allergic reaction) to a COVID-19 vaccine(s) and/or COVID-19 vaccine component(s).  o Patient meets the following definition of mod-severe immune compromised status: 1. Received B-cell depleting therapies (e.g. rituximab, obinutuzumab, ocrelizumab, alemtuzumab) within last 6 months & age > or = 78 and 6. Other actively treated hematologic malignancies or severe congenital immunodeficiency syndromes  I have spoken and communicated the following to the patient or parent/caregiver regarding COVID monoclonal antibody treatment:  1. FDA has authorized the emergency use of tixagevimab/cilgavimab for the pre-exposure prophylaxis of COVID-19 in patients with moderate-severe immunocompromised status, who meet above EUA criteria.  2. The significant known and potential risks and benefits of COVID monoclonal antibody, and the extent to which such potential risks and benefits are unknown.  3. Information on available alternative treatments and the  risks and benefits of those alternatives, including clinical trials.  4. The patient or parent/caregiver has the option to accept or refuse COVID monoclonal antibody treatment.  After reviewing this information with the patient, agree to receive tixagevimab/cilgavimab  Angelena Form, PA-C, 04/25/2021, 1:23 PM

## 2021-04-27 ENCOUNTER — Other Ambulatory Visit: Payer: Self-pay | Admitting: Medical Oncology

## 2021-04-27 ENCOUNTER — Other Ambulatory Visit: Payer: Self-pay

## 2021-04-27 ENCOUNTER — Inpatient Hospital Stay: Payer: Medicare HMO | Attending: Hematology

## 2021-04-27 DIAGNOSIS — C8298 Follicular lymphoma, unspecified, lymph nodes of multiple sites: Secondary | ICD-10-CM | POA: Diagnosis not present

## 2021-04-27 DIAGNOSIS — C8330 Diffuse large B-cell lymphoma, unspecified site: Secondary | ICD-10-CM

## 2021-04-27 DIAGNOSIS — Z298 Encounter for other specified prophylactic measures: Secondary | ICD-10-CM | POA: Diagnosis not present

## 2021-04-27 MED ORDER — CILGAVIMAB (PART OF EVUSHELD) INJECTION
300.0000 mg | Freq: Once | INTRAMUSCULAR | Status: AC
  Administered 2021-04-27: 300 mg via INTRAMUSCULAR
  Filled 2021-04-27: qty 3

## 2021-04-27 MED ORDER — TIXAGEVIMAB (PART OF EVUSHELD) INJECTION
300.0000 mg | Freq: Once | INTRAMUSCULAR | Status: AC
  Administered 2021-04-27: 300 mg via INTRAMUSCULAR
  Filled 2021-04-27: qty 3

## 2021-04-27 NOTE — Progress Notes (Signed)
Patient stayed for wait time after evusheld injection. No complications. Patient left ambulatory

## 2021-04-27 NOTE — Patient Instructions (Signed)
Separate drug sheet for Evusheld from "Up to Date"  printed and reviewed with pt and gave him a copy.

## 2021-05-31 DIAGNOSIS — E7800 Pure hypercholesterolemia, unspecified: Secondary | ICD-10-CM | POA: Diagnosis not present

## 2021-05-31 DIAGNOSIS — E038 Other specified hypothyroidism: Secondary | ICD-10-CM | POA: Diagnosis not present

## 2021-05-31 DIAGNOSIS — N400 Enlarged prostate without lower urinary tract symptoms: Secondary | ICD-10-CM | POA: Diagnosis not present

## 2021-06-01 ENCOUNTER — Telehealth: Payer: Self-pay

## 2021-06-01 NOTE — Telephone Encounter (Signed)
Returned call to pt per pt's request. Pt has developed a nodule on the right side of his chest the size of a "fifty cent piece" similar to what he has had in the past and wanted Dr Irene Limbo to be made aware. Pt is getting ready to go out of town for a week. Pt has an appointment already scheduled with Dr Irene Limbo but if he decides he wants to be seen sooner he will call back and let me know.

## 2021-06-13 ENCOUNTER — Other Ambulatory Visit: Payer: No Typology Code available for payment source

## 2021-06-13 ENCOUNTER — Ambulatory Visit: Payer: No Typology Code available for payment source

## 2021-06-13 ENCOUNTER — Ambulatory Visit: Payer: No Typology Code available for payment source | Admitting: Hematology

## 2021-06-18 ENCOUNTER — Telehealth: Payer: Self-pay | Admitting: Hematology

## 2021-06-18 NOTE — Telephone Encounter (Signed)
Changed upcoming MD appointment due to provider not in office. Patient is aware of changes. 

## 2021-06-19 NOTE — Progress Notes (Signed)
[name]   [phone] Fax:([phone]) [place] Note   Patient Care Team: Lawerance Cruel, MD as PCP - General (Family Medicine)  Date of Service:  06/20/2021   CHIEF COMPLAINTS/PURPOSE OF CONSULTATION:  F/u for Non-Hodgkin's Lymphoma on continued Rituximab q37months   Oncology History  DLBCL (diffuse large B cell lymphoma) ([place])  06/21/2015 Imaging   CT Chest/abd/pelvis: IMPRESSION: 5 mm nodule within the right lower lobe as described.   Pneumobilia consistent with a prior sphincterotomy.   No acute abnormality is identified. No findings to suggest chest etiology for the known lesion at C5 are seen.    06/21/2015 Imaging   MRI c spine1. Diffuse marrow replacement of the C5 vertebral body highly concerning for neoplasm (metastatic disease, myeloma, or lymphoma). Epidural tumor at this level results in moderate spinal stenosis with moderate impression on the spinal cord     07/10/2015 PET scan   1. The previously demonstrated abnormal C5 vertebral body is hypermetabolic. No other osseous lesions demonstrated. 2. There are small hypermetabolic lymph nodes within the left mesentery and both inguinal regions.     07/26/2015 Initial Biopsy   Had a C5 corpectomy pathology showed diffuse large B-cell lymphoma    08/08/2015 -  Chemotherapy   Started for cycle of R CHOP.     08/09/2015 -  Chemotherapy   Received first cycle of intrathecal methotrexate plus hydrocortisone for CNS prophylaxis.    02/05/2019 -  Chemotherapy      Patient is on Antibody Plan: NON-HODGKINS LYMPHOMA RITUXIMAB Q MONTH        HISTORY OF PRESENTING ILLNESS: Ethan Stewart 75 y.o. male is Dr. Grier Mitts patient who is here today for continued Rituximab maintenance due to h/o non-Hodgkin's Lymphoma / FL. The patient presents to the clinic today accompanied by his wife. The patient is currently on cycle 25 today. He notes that he has started to feel a growth again in  his abdomen over the last three weeks. He believes this to be his FL returning. The patient notes he took the Revlimid for one year and stopped in May 2021. He has been on the Rituxan for 2.5 years. The patient notes no fevers or night sweats, with a steady weight and no sudden weight loss.  Labwork today shows normal CMP and LDH, with a stable CBC.   REVIEW OF SYSTEMS: Constitutional: Denies fevers, chills or abnormal night sweats Eyes: Denies blurriness of vision, double vision or watery eyes Ears, nose, mouth, throat, and face: Denies mucositis or sore throat Respiratory: Denies cough, dyspnea or wheezes Cardiovascular: Denies palpitation, chest discomfort or lower extremity swelling Gastrointestinal:  Denies nausea, heartburn or change in bowel habits Skin: Denies abnormal skin rashes Lymphatics: Denies new lymphadenopathy or easy bruising. Patient notes a lump in his abdomen.  Neurological:Denies numbness, tingling or new weaknesses Behavioral/Psych: Mood is stable, no new changes  All other systems were reviewed with the patient and are negative.   MEDICAL HISTORY:  Past Medical History:  Diagnosis Date   Anxiety    ARDS (adult respiratory distress syndrome) ([place])    2016 August at [facility]   Arm numbness 08/07/15   Limited movement due to tumor on spine   B-cell lymphoma ([place])    Bone cancer ([place])     tumor on C5   Cervical spine tumor 07/03/2015   Diffuse large B cell lymphoma ([place])    biposy 07/25/2105   GERD (gastroesophageal reflux disease)  H. pylori infection    History of bleeding ulcers 1990's   History of blood transfusion    Hypothyroidism     SURGICAL HISTORY: Past Surgical History:  Procedure Laterality Date   ANTERIOR CERVICAL CORPECTOMY N/A 07/26/2015   Procedure: Cervical five Corpectomy/Cervical four-six Fusion/Plate ;  Surgeon: Eustace Moore, MD;  Location: [facility];  Service: Neurosurgery;  Laterality: N/A;  C5 Corpectomy/C4-6 Fusion/Plate C4-6    COLONOSCOPY     EYE SURGERY Right    cataracts   HERNIA REPAIR Bilateral    with mesh   IR GENERIC HISTORICAL  02/03/2017   IR REMOVAL TUN ACCESS W/ PORT W/O FL MOD SED 02/03/2017 Markus Daft, MD [facility]-INTERV RAD   IR REMOVAL TUN ACCESS W/ PORT W/O FL MOD SED  06/28/2020   LYMPH NODE BIOPSY Right 12/11/2018   Procedure: EXCISIONAL BIOPSY OF DEEP RIGHT INGUINAL LYMPH NODE ERAS PATHWAY;  Surgeon: Fanny Skates, MD;  Location: [facility];  Service: General;  Laterality: Right;    SOCIAL HISTORY: Social History   Socioeconomic History   Marital status: Married    Spouse name: Not on file   Number of children: Not on file   Years of education: Not on file   Highest education level: Not on file  Occupational History   Not on file  Tobacco Use   Smoking status: Former    Packs/day: 1.00    Years: 10.00    Pack years: 10.00    Types: Cigarettes    Quit date: 12/23/1981    Years since quitting: 39.5   Smokeless tobacco: Never  Vaping Use   Vaping Use: Never used  Substance and Sexual Activity   Alcohol use: Yes    Alcohol/week: 5.0 standard drinks    Types: 2 Glasses of wine, 3 Cans of beer per week   Drug use: No   Sexual activity: Yes  Other Topics Concern   Not on file  Social History Narrative   Not on file   Social Determinants of Health   Financial Resource Strain: Not on file  Food Insecurity: Not on file  Transportation Needs: Not on file  Physical Activity: Not on file  Stress: Not on file  Social Connections: Not on file  Intimate Partner Violence: Not on file    FAMILY HISTORY: Family History  Problem Relation Age of Onset   Hypertension Mother     ALLERGIES:  is allergic to pegfilgrastim.  MEDICATIONS:  Current Outpatient Medications  Medication Sig Dispense Refill   cholecalciferol (VITAMIN D3) 25 MCG (1000 UNIT) tablet 1 tablet     diazepam (VALIUM) 2 MG tablet Take 2 mg by mouth daily as needed for anxiety.     ergocalciferol (VITAMIN D2) 1.25 MG (50000  UT) capsule Take 1 capsule (50,000 Units total) by mouth once a week. 12 capsule 4   levothyroxine (SYNTHROID, LEVOTHROID) 88 MCG tablet Take 88 mcg by mouth daily before breakfast.   2   Multiple Vitamins-Minerals (CENTRUM SILVER PO) Take by mouth daily.     polyethylene glycol powder (GLYCOLAX/MIRALAX) 17 GM/SCOOP powder TAKE AS DIRECTED BY MOUTH     rivaroxaban (XARELTO) 10 MG TABS tablet Take 1 tablet by mouth daily.     simvastatin (ZOCOR) 20 MG tablet Take 20 mg by mouth daily.     simvastatin (ZOCOR) 40 MG tablet Take 0.5 tablets by mouth at bedtime.     tamsulosin (FLOMAX) 0.4 MG CAPS capsule Take 1 capsule (0.4 mg total) by mouth at  bedtime. 30 capsule 3   temazepam (RESTORIL) 15 MG capsule Take 15 mg by mouth at bedtime as needed for sleep.      No current facility-administered medications for this visit.    PHYSICAL EXAMINATION: ECOG PERFORMANCE STATUS: 0  Vitals:   06/20/21 0933  BP: (!) 143/79  Pulse: 61  Resp: 17  Temp: 98 F (36.7 C)  SpO2: 100%   Filed Weights   06/20/21 0933  Weight: 223 lb 4.8 oz (101.3 kg)   GENERAL:alert, no distress and comfortable SKIN: skin color, texture, turgor are normal, no rashes or significant lesions EYES: normal, Conjunctiva are pink and non-injected, sclera clear  NECK: supple, thyroid normal size, non-tender, without nodularity LYMPH:  no palpable lymphadenopathy in the cervical, axillary, or inguinal regions. LUNGS: clear to auscultation and percussion with normal breathing effort HEART: regular rate & rhythm and no murmurs and no lower extremity edema ABDOMEN:abdomen soft, non-tender and normal bowel sounds. No abdominal masses felt in abdomen. The right rib cage appears more prominent than left. A 1cm subcutaneous nodule felt when pt sat up straight. NEURO: alert & oriented x 3 with fluent speech, no focal motor/sensory deficits  LABORATORY DATA:  I have reviewed the data as listed CBC Latest Ref Rng & Units 06/20/2021  04/18/2021 02/14/2021  WBC 4.0 - 10.5 K/uL 3.6(L) 3.8(L) 3.6(L)  Hemoglobin 13.0 - 17.0 g/dL 14.4 14.9 14.7  Hematocrit 39.0 - 52.0 % 40.6 42.1 41.9  Platelets 150 - 400 K/uL 137(L) 129(L) 131(L)    CMP Latest Ref Rng & Units 06/20/2021 04/18/2021 02/14/2021  Glucose 70 - 99 mg/dL 90 98 92  BUN 8 - 23 mg/dL 17 17 16   Creatinine 0.61 - 1.24 mg/dL 1.04 1.07 1.04  Sodium 135 - 145 mmol/L 141 142 141  Potassium 3.5 - 5.1 mmol/L 4.4 4.2 4.3  Chloride 98 - 111 mmol/L 109 109 109  CO2 22 - 32 mmol/L 23 25 24   Calcium 8.9 - 10.3 mg/dL 9.1 9.1 9.0  Total Protein 6.5 - 8.1 g/dL 6.3(L) 6.2(L) 6.3(L)  Total Bilirubin 0.3 - 1.2 mg/dL 0.6 0.6 0.6  Alkaline Phos 38 - 126 U/L 74 71 74  AST 15 - 41 U/L 24 30 29   ALT 0 - 44 U/L 22 26 27      RADIOGRAPHIC STUDIES: I have personally reviewed the radiological images as listed and agreed with the findings in the report. No results found.  ASSESSMENT & PLAN:  Ethan Stewart is a 75 y.o.  male with a history of :  1 follicular lymphoma -Previously treated with Revlimid and Rituxan, currently on Rituxan maintenance q2 months as seen by Dr. Irene Limbo -He is clinically doing well, no B symptoms.  Lab reviewed, CBC, CMP and LDH marker normal and stable. - patient is concerned about the subcutaneous 1-1.5 cm nodule felt in abdomen near rib cage upon patient sitting up and standing. This was not felt when pt was laying down. This is all likely a lymph node, probably benign cyst.  -He is due for restaging CT scan.  Will get CT C/A/P w contrast scan in the next few weeks -No constitutional symptoms concerning at this time. -Continue with treatment today as scheduled. -Recommended pt see scheduling for getting CT exam scheduled and receiving the contrast dye. -The pt has no prohibitive toxicities from continuing maintenance Rituxan at this time. Continue q69monthly at this time.    PLAN:  -Reviewed labs with patient. LDH normal. CBC stable. No concerns at this  time. -Will get CT C/A/P w contrast in next few weeks for restaging, and also evaluate his subcutaneous nodule on the abdominal wall -Will see Dr. Irene Limbo back to review scans in 2 weeks.    Orders Placed This Encounter  Procedures   CT CHEST ABDOMEN PELVIS W CONTRAST    Standing Status:   Future    Standing Expiration Date:   06/20/2022    Order Specific Question:   If indicated for the ordered procedure, I authorize the administration of contrast media per Radiology protocol    Answer:   Yes    Order Specific Question:   Preferred imaging location?    Answer:   [facility]    Order Specific Question:   Release to patient    Answer:   Immediate    Order Specific Question:   Is Oral Contrast requested for this exam?    Answer:   Yes, Per Radiology protocol    Order Specific Question:   Reason for Exam (SYMPTOM  OR DIAGNOSIS REQUIRED)    Answer:   restaging, rule out lymphoma progression. pt has a 1cm subcutaneous node at epigastrtic area     All questions were answered. The patient knows to call the clinic with any problems, questions or concerns. The total time spent in the appointment was 30 minutes.     Truitt Merle, MD 06/20/2021 4:07 PM  I, Reinaldo Raddle, am acting as scribe for Dr. Truitt Merle, MD.

## 2021-06-20 ENCOUNTER — Other Ambulatory Visit: Payer: Self-pay

## 2021-06-20 ENCOUNTER — Inpatient Hospital Stay: Payer: No Typology Code available for payment source | Admitting: Hematology

## 2021-06-20 ENCOUNTER — Inpatient Hospital Stay: Payer: No Typology Code available for payment source | Attending: Hematology

## 2021-06-20 ENCOUNTER — Inpatient Hospital Stay: Payer: No Typology Code available for payment source

## 2021-06-20 ENCOUNTER — Inpatient Hospital Stay (HOSPITAL_BASED_OUTPATIENT_CLINIC_OR_DEPARTMENT_OTHER): Payer: No Typology Code available for payment source | Admitting: Hematology

## 2021-06-20 ENCOUNTER — Encounter: Payer: Self-pay | Admitting: Hematology

## 2021-06-20 VITALS — BP 132/75 | HR 50 | Temp 97.7°F | Resp 18

## 2021-06-20 VITALS — BP 143/79 | HR 61 | Temp 98.0°F | Resp 17 | Ht 73.0 in | Wt 223.3 lb

## 2021-06-20 DIAGNOSIS — C8298 Follicular lymphoma, unspecified, lymph nodes of multiple sites: Secondary | ICD-10-CM | POA: Diagnosis present

## 2021-06-20 DIAGNOSIS — Z79899 Other long term (current) drug therapy: Secondary | ICD-10-CM | POA: Diagnosis not present

## 2021-06-20 DIAGNOSIS — Z298 Encounter for other specified prophylactic measures: Secondary | ICD-10-CM

## 2021-06-20 DIAGNOSIS — Z5112 Encounter for antineoplastic immunotherapy: Secondary | ICD-10-CM | POA: Diagnosis not present

## 2021-06-20 DIAGNOSIS — Z7901 Long term (current) use of anticoagulants: Secondary | ICD-10-CM | POA: Insufficient documentation

## 2021-06-20 DIAGNOSIS — E039 Hypothyroidism, unspecified: Secondary | ICD-10-CM | POA: Diagnosis not present

## 2021-06-20 DIAGNOSIS — C8330 Diffuse large B-cell lymphoma, unspecified site: Secondary | ICD-10-CM

## 2021-06-20 LAB — CBC WITH DIFFERENTIAL/PLATELET
Abs Immature Granulocytes: 0.01 10*3/uL (ref 0.00–0.07)
Basophils Absolute: 0 10*3/uL (ref 0.0–0.1)
Basophils Relative: 1 %
Eosinophils Absolute: 0.1 10*3/uL (ref 0.0–0.5)
Eosinophils Relative: 2 %
HCT: 40.6 % (ref 39.0–52.0)
Hemoglobin: 14.4 g/dL (ref 13.0–17.0)
Immature Granulocytes: 0 %
Lymphocytes Relative: 26 %
Lymphs Abs: 0.9 10*3/uL (ref 0.7–4.0)
MCH: 30.8 pg (ref 26.0–34.0)
MCHC: 35.5 g/dL (ref 30.0–36.0)
MCV: 86.8 fL (ref 80.0–100.0)
Monocytes Absolute: 0.4 10*3/uL (ref 0.1–1.0)
Monocytes Relative: 12 %
Neutro Abs: 2.1 10*3/uL (ref 1.7–7.7)
Neutrophils Relative %: 59 %
Platelets: 137 10*3/uL — ABNORMAL LOW (ref 150–400)
RBC: 4.68 MIL/uL (ref 4.22–5.81)
RDW: 12.9 % (ref 11.5–15.5)
WBC: 3.6 10*3/uL — ABNORMAL LOW (ref 4.0–10.5)
nRBC: 0 % (ref 0.0–0.2)

## 2021-06-20 LAB — CMP (CANCER CENTER ONLY)
ALT: 22 U/L (ref 0–44)
AST: 24 U/L (ref 15–41)
Albumin: 3.9 g/dL (ref 3.5–5.0)
Alkaline Phosphatase: 74 U/L (ref 38–126)
Anion gap: 9 (ref 5–15)
BUN: 17 mg/dL (ref 8–23)
CO2: 23 mmol/L (ref 22–32)
Calcium: 9.1 mg/dL (ref 8.9–10.3)
Chloride: 109 mmol/L (ref 98–111)
Creatinine: 1.04 mg/dL (ref 0.61–1.24)
GFR, Estimated: >60 mL/min (ref >60)
Glucose, Bld: 90 mg/dL (ref 70–99)
Potassium: 4.4 mmol/L (ref 3.5–5.1)
Sodium: 141 mmol/L (ref 135–145)
Total Bilirubin: 0.6 mg/dL (ref 0.3–1.2)
Total Protein: 6.3 g/dL — ABNORMAL LOW (ref 6.5–8.1)

## 2021-06-20 LAB — LACTATE DEHYDROGENASE: LDH: 175 U/L (ref 98–192)

## 2021-06-20 MED ORDER — SODIUM CHLORIDE 0.9 % IV SOLN
375.0000 mg/m2 | Freq: Once | INTRAVENOUS | Status: AC
  Administered 2021-06-20: 900 mg via INTRAVENOUS
  Filled 2021-06-20: qty 50

## 2021-06-20 MED ORDER — DIPHENHYDRAMINE HCL 25 MG PO CAPS
ORAL_CAPSULE | ORAL | Status: AC
  Filled 2021-06-20: qty 2

## 2021-06-20 MED ORDER — METHYLPREDNISOLONE SODIUM SUCC 125 MG IJ SOLR
125.0000 mg | Freq: Once | INTRAMUSCULAR | Status: AC
  Administered 2021-06-20: 125 mg via INTRAVENOUS

## 2021-06-20 MED ORDER — ACETAMINOPHEN 325 MG PO TABS
ORAL_TABLET | ORAL | Status: AC
  Filled 2021-06-20: qty 2

## 2021-06-20 MED ORDER — SODIUM CHLORIDE 0.9 % IV SOLN
Freq: Once | INTRAVENOUS | Status: AC
  Filled 2021-06-20: qty 250

## 2021-06-20 MED ORDER — METHYLPREDNISOLONE SODIUM SUCC 125 MG IJ SOLR
INTRAMUSCULAR | Status: AC
  Filled 2021-06-20: qty 2

## 2021-06-20 MED ORDER — DIPHENHYDRAMINE HCL 25 MG PO CAPS
50.0000 mg | ORAL_CAPSULE | Freq: Once | ORAL | Status: AC
Start: 2021-06-20 — End: 2021-06-20
  Administered 2021-06-20: 50 mg via ORAL

## 2021-06-20 MED ORDER — ACETAMINOPHEN 325 MG PO TABS
650.0000 mg | ORAL_TABLET | Freq: Once | ORAL | Status: AC
  Administered 2021-06-20: 650 mg via ORAL

## 2021-06-20 NOTE — Patient Instructions (Signed)
[facility]  Discharge Instructions: Thank you for choosing [facility] to provide your oncology and hematology care.   If you have a lab appointment with the Cancer Center, please go directly to the Cancer Center and check in at the registration area.   Wear comfortable clothing and clothing appropriate for easy access to any Portacath or PICC line.   We strive to give you quality time with your provider. You may need to reschedule your appointment if you arrive late (15 or more minutes).  Arriving late affects you and other patients whose appointments are after yours.  Also, if you miss three or more appointments without notifying the office, you may be dismissed from the clinic at the provider's discretion.      For prescription refill requests, have your pharmacy contact our office and allow 72 hours for refills to be completed.    Today you received the following chemotherapy and/or immunotherapy agents Rituxan      To help prevent nausea and vomiting after your treatment, we encourage you to take your nausea medication as directed.  BELOW ARE SYMPTOMS THAT SHOULD BE REPORTED IMMEDIATELY: *FEVER GREATER THAN 100.4 F (38 C) OR HIGHER *CHILLS OR SWEATING *NAUSEA AND VOMITING THAT IS NOT CONTROLLED WITH YOUR NAUSEA MEDICATION *UNUSUAL SHORTNESS OF BREATH *UNUSUAL BRUISING OR BLEEDING *URINARY PROBLEMS (pain or burning when urinating, or frequent urination) *BOWEL PROBLEMS (unusual diarrhea, constipation, pain near the anus) TENDERNESS IN MOUTH AND THROAT WITH OR WITHOUT PRESENCE OF ULCERS (sore throat, sores in mouth, or a toothache) UNUSUAL RASH, SWELLING OR PAIN  UNUSUAL VAGINAL DISCHARGE OR ITCHING   Items with * indicate a potential emergency and should be followed up as soon as possible or go to the Emergency Department if any problems should occur.  Please show the CHEMOTHERAPY ALERT CARD or IMMUNOTHERAPY ALERT CARD at check-in to the  Emergency Department and triage nurse.  Should you have questions after your visit or need to cancel or reschedule your appointment, please contact [facility]  Dept: [phone]  and follow the prompts.  Office hours are 8:00 a.m. to 4:30 p.m. Monday - Friday. Please note that voicemails left after 4:00 p.m. may not be returned until the following business day.  We are closed weekends and major holidays. You have access to a nurse at all times for urgent questions. Please call the main number to the clinic Dept: [phone] and follow the prompts.   For any non-urgent questions, you may also contact your provider using MyChart. We now offer e-Visits for anyone 18 and older to request care online for non-urgent symptoms. For details visit [email or web address].   Also download the MyChart app! Go to the app store, search "MyChart", open the app, select , and log in with your MyChart username and password.  Due to Covid, a mask is required upon entering the hospital/clinic. If you do not have a mask, one will be given to you upon arrival. For doctor visits, patients may have 1 support person aged 18 or older with them. For treatment visits, patients cannot have anyone with them due to current Covid guidelines and our immunocompromised population.   

## 2021-06-26 ENCOUNTER — Encounter: Payer: Self-pay | Admitting: Hematology

## 2021-06-27 ENCOUNTER — Other Ambulatory Visit: Payer: Self-pay

## 2021-06-27 ENCOUNTER — Ambulatory Visit (HOSPITAL_COMMUNITY)
Admission: RE | Admit: 2021-06-27 | Discharge: 2021-06-27 | Disposition: A | Discharge status: Home / Self Care | Payer: No Typology Code available for payment source | Source: Ambulatory Visit | Attending: Hematology | Admitting: Hematology

## 2021-06-27 DIAGNOSIS — C8298 Follicular lymphoma, unspecified, lymph nodes of multiple sites: Secondary | ICD-10-CM | POA: Insufficient documentation

## 2021-06-27 MED ORDER — SODIUM CHLORIDE (PF) 0.9 % IJ SOLN
INTRAMUSCULAR | Status: AC
  Filled 2021-06-27: qty 50

## 2021-06-27 MED ORDER — IOHEXOL 300 MG/ML  SOLN
100.0000 mL | Freq: Once | INTRAMUSCULAR | Status: AC | PRN
  Administered 2021-06-27: 100 mL via INTRAVENOUS

## 2021-06-28 ENCOUNTER — Encounter: Payer: Self-pay | Admitting: Hematology

## 2021-07-06 DIAGNOSIS — L570 Actinic keratosis: Secondary | ICD-10-CM | POA: Diagnosis not present

## 2021-07-06 DIAGNOSIS — Z85828 Personal history of other malignant neoplasm of skin: Secondary | ICD-10-CM | POA: Diagnosis not present

## 2021-07-06 DIAGNOSIS — L814 Other melanin hyperpigmentation: Secondary | ICD-10-CM | POA: Diagnosis not present

## 2021-07-06 DIAGNOSIS — L821 Other seborrheic keratosis: Secondary | ICD-10-CM | POA: Diagnosis not present

## 2021-07-06 DIAGNOSIS — C44622 Squamous cell carcinoma of skin of right upper limb, including shoulder: Secondary | ICD-10-CM | POA: Diagnosis not present

## 2021-07-06 DIAGNOSIS — D225 Melanocytic nevi of trunk: Secondary | ICD-10-CM | POA: Diagnosis not present

## 2021-07-25 DIAGNOSIS — E7800 Pure hypercholesterolemia, unspecified: Secondary | ICD-10-CM | POA: Diagnosis not present

## 2021-07-25 DIAGNOSIS — E038 Other specified hypothyroidism: Secondary | ICD-10-CM | POA: Diagnosis not present

## 2021-07-25 DIAGNOSIS — N400 Enlarged prostate without lower urinary tract symptoms: Secondary | ICD-10-CM | POA: Diagnosis not present

## 2021-08-15 NOTE — Progress Notes (Signed)
Message to scheduling: schedule for MD visit in infusion at 10 am on 08/22/21. OK to overbook that slot.

## 2021-08-22 ENCOUNTER — Inpatient Hospital Stay: Payer: No Typology Code available for payment source | Attending: Hematology

## 2021-08-22 ENCOUNTER — Inpatient Hospital Stay (HOSPITAL_BASED_OUTPATIENT_CLINIC_OR_DEPARTMENT_OTHER): Payer: No Typology Code available for payment source | Admitting: Hematology

## 2021-08-22 ENCOUNTER — Inpatient Hospital Stay: Payer: No Typology Code available for payment source

## 2021-08-22 ENCOUNTER — Telehealth: Payer: Self-pay | Admitting: Hematology

## 2021-08-22 ENCOUNTER — Other Ambulatory Visit: Payer: Self-pay

## 2021-08-22 VITALS — BP 138/81 | HR 50 | Temp 97.8°F | Resp 16 | Wt 221.8 lb

## 2021-08-22 DIAGNOSIS — C8298 Follicular lymphoma, unspecified, lymph nodes of multiple sites: Secondary | ICD-10-CM | POA: Insufficient documentation

## 2021-08-22 DIAGNOSIS — Z5112 Encounter for antineoplastic immunotherapy: Secondary | ICD-10-CM | POA: Diagnosis present

## 2021-08-22 DIAGNOSIS — Z79899 Other long term (current) drug therapy: Secondary | ICD-10-CM | POA: Insufficient documentation

## 2021-08-22 DIAGNOSIS — Z298 Encounter for other specified prophylactic measures: Secondary | ICD-10-CM | POA: Diagnosis not present

## 2021-08-22 DIAGNOSIS — C8330 Diffuse large B-cell lymphoma, unspecified site: Secondary | ICD-10-CM | POA: Diagnosis not present

## 2021-08-22 LAB — CMP (CANCER CENTER ONLY)
ALT: 26 U/L (ref 0–44)
AST: 25 U/L (ref 15–41)
Albumin: 4.2 g/dL (ref 3.5–5.0)
Alkaline Phosphatase: 67 U/L (ref 38–126)
Anion gap: 8 (ref 5–15)
BUN: 16 mg/dL (ref 8–23)
CO2: 24 mmol/L (ref 22–32)
Calcium: 9.1 mg/dL (ref 8.9–10.3)
Chloride: 110 mmol/L (ref 98–111)
Creatinine: 1.01 mg/dL (ref 0.61–1.24)
GFR, Estimated: >60 mL/min (ref >60)
Glucose, Bld: 108 mg/dL — ABNORMAL HIGH (ref 70–99)
Potassium: 4.3 mmol/L (ref 3.5–5.1)
Sodium: 142 mmol/L (ref 135–145)
Total Bilirubin: 0.6 mg/dL (ref 0.3–1.2)
Total Protein: 6.3 g/dL — ABNORMAL LOW (ref 6.5–8.1)

## 2021-08-22 LAB — CBC WITH DIFFERENTIAL/PLATELET
Abs Immature Granulocytes: 0.01 10*3/uL (ref 0.00–0.07)
Basophils Absolute: 0 10*3/uL (ref 0.0–0.1)
Basophils Relative: 1 %
Eosinophils Absolute: 0.1 10*3/uL (ref 0.0–0.5)
Eosinophils Relative: 2 %
HCT: 41.8 % (ref 39.0–52.0)
Hemoglobin: 14.6 g/dL (ref 13.0–17.0)
Immature Granulocytes: 0 %
Lymphocytes Relative: 24 %
Lymphs Abs: 0.9 10*3/uL (ref 0.7–4.0)
MCH: 30.8 pg (ref 26.0–34.0)
MCHC: 34.9 g/dL (ref 30.0–36.0)
MCV: 88.2 fL (ref 80.0–100.0)
Monocytes Absolute: 0.5 10*3/uL (ref 0.1–1.0)
Monocytes Relative: 13 %
Neutro Abs: 2.3 10*3/uL (ref 1.7–7.7)
Neutrophils Relative %: 60 %
Platelets: 132 10*3/uL — ABNORMAL LOW (ref 150–400)
RBC: 4.74 MIL/uL (ref 4.22–5.81)
RDW: 12.9 % (ref 11.5–15.5)
WBC: 3.8 10*3/uL — ABNORMAL LOW (ref 4.0–10.5)
nRBC: 0 % (ref 0.0–0.2)

## 2021-08-22 LAB — LACTATE DEHYDROGENASE: LDH: 187 U/L (ref 98–192)

## 2021-08-22 MED ORDER — DIPHENHYDRAMINE HCL 25 MG PO CAPS
50.0000 mg | ORAL_CAPSULE | Freq: Once | ORAL | Status: AC
  Administered 2021-08-22: 50 mg via ORAL
  Filled 2021-08-22: qty 2

## 2021-08-22 MED ORDER — SODIUM CHLORIDE 0.9 % IV SOLN
375.0000 mg/m2 | Freq: Once | INTRAVENOUS | Status: AC
  Administered 2021-08-22: 900 mg via INTRAVENOUS
  Filled 2021-08-22: qty 40

## 2021-08-22 MED ORDER — METHYLPREDNISOLONE SODIUM SUCC 125 MG IJ SOLR
125.0000 mg | Freq: Once | INTRAMUSCULAR | Status: AC
  Administered 2021-08-22: 125 mg via INTRAVENOUS
  Filled 2021-08-22: qty 2

## 2021-08-22 MED ORDER — ACETAMINOPHEN 325 MG PO TABS
650.0000 mg | ORAL_TABLET | Freq: Once | ORAL | Status: AC
  Administered 2021-08-22: 650 mg via ORAL
  Filled 2021-08-22: qty 2

## 2021-08-22 MED ORDER — SODIUM CHLORIDE 0.9 % IV SOLN
Freq: Once | INTRAVENOUS | Status: AC
Start: 2021-08-22 — End: 2021-08-22

## 2021-08-22 NOTE — Telephone Encounter (Signed)
Scheduled follow-up appointments per 8/31 los. Patient's wife is aware.

## 2021-08-22 NOTE — Patient Instructions (Signed)
[facility]  Discharge Instructions: Thank you for choosing [facility] to provide your oncology and hematology care.   If you have a lab appointment with the Cancer Center, please go directly to the Cancer Center and check in at the registration area.   Wear comfortable clothing and clothing appropriate for easy access to any Portacath or PICC line.   We strive to give you quality time with your provider. You may need to reschedule your appointment if you arrive late (15 or more minutes).  Arriving late affects you and other patients whose appointments are after yours.  Also, if you miss three or more appointments without notifying the office, you may be dismissed from the clinic at the provider's discretion.      For prescription refill requests, have your pharmacy contact our office and allow 72 hours for refills to be completed.    Today you received the following chemotherapy and/or immunotherapy agents Rituxan      To help prevent nausea and vomiting after your treatment, we encourage you to take your nausea medication as directed.  BELOW ARE SYMPTOMS THAT SHOULD BE REPORTED IMMEDIATELY: *FEVER GREATER THAN 100.4 F (38 C) OR HIGHER *CHILLS OR SWEATING *NAUSEA AND VOMITING THAT IS NOT CONTROLLED WITH YOUR NAUSEA MEDICATION *UNUSUAL SHORTNESS OF BREATH *UNUSUAL BRUISING OR BLEEDING *URINARY PROBLEMS (pain or burning when urinating, or frequent urination) *BOWEL PROBLEMS (unusual diarrhea, constipation, pain near the anus) TENDERNESS IN MOUTH AND THROAT WITH OR WITHOUT PRESENCE OF ULCERS (sore throat, sores in mouth, or a toothache) UNUSUAL RASH, SWELLING OR PAIN  UNUSUAL VAGINAL DISCHARGE OR ITCHING   Items with * indicate a potential emergency and should be followed up as soon as possible or go to the Emergency Department if any problems should occur.  Please show the CHEMOTHERAPY ALERT CARD or IMMUNOTHERAPY ALERT CARD at check-in to the  Emergency Department and triage nurse.  Should you have questions after your visit or need to cancel or reschedule your appointment, please contact [facility]  Dept: [phone]  and follow the prompts.  Office hours are 8:00 a.m. to 4:30 p.m. Monday - Friday. Please note that voicemails left after 4:00 p.m. may not be returned until the following business day.  We are closed weekends and major holidays. You have access to a nurse at all times for urgent questions. Please call the main number to the clinic Dept: [phone] and follow the prompts.   For any non-urgent questions, you may also contact your provider using MyChart. We now offer e-Visits for anyone 18 and older to request care online for non-urgent symptoms. For details visit [email or web address].   Also download the MyChart app! Go to the app store, search "MyChart", open the app, select [facility], and log in with your MyChart username and password.  Due to Covid, a mask is required upon entering the hospital/clinic. If you do not have a mask, one will be given to you upon arrival. For doctor visits, patients may have 1 support person aged 18 or older with them. For treatment visits, patients cannot have anyone with them due to current Covid guidelines and our immunocompromised population.   

## 2021-08-28 ENCOUNTER — Encounter: Payer: Self-pay | Admitting: Hematology

## 2021-08-28 NOTE — Progress Notes (Signed)
[place]   Telephone:([phone] Fax:(336) [place] Note   Patient Care Team: Lawerance Cruel, MD as PCP - General (Family Medicine)  Date of Service:  .08/22/2021   CHIEF COMPLAINTS/PURPOSE OF CONSULTATION:  F/u for Non-Hodgkin's Lymphoma on continued Rituximab q58month   Oncology History  DLBCL (diffuse large B cell lymphoma) ([place]  06/21/2015 Imaging   CT Chest/abd/pelvis: IMPRESSION: 5 mm nodule within the right lower lobe as described.   Pneumobilia consistent with a prior sphincterotomy.   No acute abnormality is identified. No findings to suggest chest etiology for the known lesion at C5 are seen.   06/21/2015 Imaging   MRI c spine1. Diffuse marrow replacement of the C5 vertebral body highly concerning for neoplasm (metastatic disease, myeloma, or lymphoma). Epidural tumor at this level results in moderate spinal stenosis with moderate impression on the spinal cord    07/10/2015 PET scan   1. The previously demonstrated abnormal C5 vertebral body is hypermetabolic. No other osseous lesions demonstrated. 2. There are small hypermetabolic lymph nodes within the left mesentery and both inguinal regions.    07/26/2015 Initial Biopsy   Had a C5 corpectomy pathology showed diffuse large B-cell lymphoma   08/08/2015 -  Chemotherapy   Started for cycle of R CHOP.    08/09/2015 -  Chemotherapy   Received first cycle of intrathecal methotrexate plus hydrocortisone for CNS prophylaxis.   02/05/2019 -  Chemotherapy      Patient is on Antibody Plan: NON-HODGKINS LYMPHOMA RITUXIMAB Q MONTH        HISTORY OF PRESENTING ILLNESS:  Ethan Lemming75y.o. male is here for follow-up prior to his next cycle of maintenance rituximab for follicular lymphoma.  He notes no acute issues from his previous dose of Rituxan. He notes he has been feeling well and has had no infection issues. No fevers no chills no night sweats no unexpected weight loss no  new fatigue no new lumps or bumps.  Labwork today shows normal CMP and LDH, with a stable CBC.   REVIEW OF SYSTEMS: .10 Point review of Systems was done is negative except as noted above.    MEDICAL HISTORY:  Past Medical History:  Diagnosis Date   Anxiety    AR[place] distress syndrome) [place]    [facility]  Arm numbness 08/07/15   Limited movement due to tumor on spine   B-cell lymphoma ([place]    [place] cancer [place]     tumor on C5   Cervical spine tumor 07/03/2015   Diffuse large B cell lymphoma ([place]    biposy 07/25/2105   GERD (gastroesophageal reflux disease)    H. pylori infection    History of bleeding ulcers 1990's   History of blood transfusion    Hypothyroidism     SURGICAL HISTORY: Past Surgical History:  Procedure Laterality Date   ANTERIOR CERVICAL CORPECTOMY N/A 07/26/2015   Procedure: Cervical five Corpectomy/Cervical four-six Fusion/Plate ;  Surgeon: DEustace Moore MD;  [facility];  Service: Neurosurgery;  Laterality: N/A;  C5 Corpectomy/C4-6 Fusion/Plate C4-6   COLONOSCOPY     EYE SURGERY Right    cataracts   HERNIA REPAIR Bilateral    with mesh   IR GENERIC HISTORICAL  02/03/2017   IR REMOVAL TUN ACCESS W/ PORT W/O FL MOD SED 02/03/2017 AMarkus Daft MD [facility]   IR REMOVAL TUN ACCESS W/ PORT W/O FL MOD SED  06/28/2020  LYMPH NODE BIOPSY Right 12/11/2018   Procedure: EXCISIONAL BIOPSY OF DEEP RIGHT INGUINAL LYMPH NODE ERAS PATHWAY;  Surgeon: Fanny Skates, MD;  Location: [place];  Service: General;  Laterality: Right;    SOCIAL HISTORY: Social History   Socioeconomic History   Marital status: Married    Spouse name: Not on file   Number of children: Not on file   Years of education: Not on file   Highest education level: Not on file  Occupational History   Not on file  Tobacco Use   Smoking status: Former    Packs/day: 1.00    Years: 10.00    Pack years: 10.00    Types: Cigarettes    Quit date: 12/23/1981     Years since quitting: 39.7   Smokeless tobacco: Never  Vaping Use   Vaping Use: Never used  Substance and Sexual Activity   Alcohol use: Yes    Alcohol/week: 5.0 standard drinks    Types: 2 Glasses of wine, 3 Cans of beer per week   Drug use: No   Sexual activity: Yes  Other Topics Concern   Not on file  Social History Narrative   Not on file   Social Determinants of Health   Financial Resource Strain: Not on file  Food Insecurity: Not on file  Transportation Needs: Not on file  Physical Activity: Not on file  Stress: Not on file  Social Connections: Not on file  Intimate Partner Violence: Not on file    FAMILY HISTORY: Family History  Problem Relation Age of Onset   Hypertension Mother     ALLERGIES:  is allergic to pegfilgrastim.  MEDICATIONS:  Current Outpatient Medications  Medication Sig Dispense Refill   cholecalciferol (VITAMIN D3) 25 MCG (1000 UNIT) tablet 1 tablet     diazepam (VALIUM) 2 MG tablet Take 2 mg by mouth daily as needed for anxiety.     ergocalciferol (VITAMIN D2) 1.25 MG (50000 UT) capsule Take 1 capsule (50,000 Units total) by mouth once a week. 12 capsule 4   levothyroxine (SYNTHROID, LEVOTHROID) 88 MCG tablet Take 88 mcg by mouth daily before breakfast.   2   Multiple Vitamins-Minerals (CENTRUM SILVER PO) Take by mouth daily.     polyethylene glycol powder (GLYCOLAX/MIRALAX) 17 GM/SCOOP powder TAKE AS DIRECTED BY MOUTH     rivaroxaban (XARELTO) 10 MG TABS tablet Take 1 tablet by mouth daily.     simvastatin (ZOCOR) 20 MG tablet Take 20 mg by mouth daily.     simvastatin (ZOCOR) 40 MG tablet Take 0.5 tablets by mouth at bedtime.     tamsulosin (FLOMAX) 0.4 MG CAPS capsule Take 1 capsule (0.4 mg total) by mouth at bedtime. 30 capsule 3   temazepam (RESTORIL) 15 MG capsule Take 15 mg by mouth at bedtime as needed for sleep.      No current facility-administered medications for this visit.    PHYSICAL EXAMINATION: ECOG PERFORMANCE STATUS:  0 Vital signs reviewed GENERAL:alert, no distress and comfortable . GENERAL:alert, in no acute distress and comfortable SKIN: no acute rashes, no significant lesions EYES: conjunctiva are pink and non-injected, sclera anicteric OROPHARYNX: MMM, no exudates, no oropharyngeal erythema or ulceration NECK: supple, no JVD LYMPH:  no palpable lymphadenopathy in the cervical, axillary or inguinal regions LUNGS: clear to auscultation b/l with normal respiratory effort HEART: regular rate & rhythm ABDOMEN:  normoactive bowel sounds , non tender, not distended. Extremity: no pedal edema PSYCH: alert & oriented x 3 with fluent speech NEURO:  no focal motor/sensory deficits   LABORATORY DATA:  I have reviewed the data as listed CBC Latest Ref Rng & Units 08/22/2021 06/20/2021 04/18/2021  WBC 4.0 - 10.5 K/uL 3.8(L) 3.6(L) 3.8(L)  Hemoglobin 13.0 - 17.0 g/dL 14.6 14.4 14.9  Hematocrit 39.0 - 52.0 % 41.8 40.6 42.1  Platelets 150 - 400 K/uL 132(L) 137(L) 129(L)    CMP Latest Ref Rng & Units 08/22/2021 06/20/2021 04/18/2021  Glucose 70 - 99 mg/dL 108(H) 90 98  BUN 8 - 23 mg/dL '16 17 17  '$ Creatinine 0.61 - 1.24 mg/dL 1.01 1.04 1.07  Sodium 135 - 145 mmol/L 142 141 142  Potassium 3.5 - 5.1 mmol/L 4.3 4.4 4.2  Chloride 98 - 111 mmol/L 110 109 109  CO2 22 - 32 mmol/L '24 23 25  '$ Calcium 8.9 - 10.3 mg/dL 9.1 9.1 9.1  Total Protein 6.5 - 8.1 g/dL 6.3(L) 6.3(L) 6.2(L)  Total Bilirubin 0.3 - 1.2 mg/dL 0.6 0.6 0.6  Alkaline Phos 38 - 126 U/L 67 74 71  AST 15 - 41 U/L '25 24 30  '$ ALT 0 - 44 U/L '26 22 26   '$ . Lab Results  Component Value Date   LDH 187 08/22/2021     RADIOGRAPHIC STUDIES: I have personally reviewed the radiological images as listed and agreed with the findings in the report. No results found.  ASSESSMENT & PLAN:  Ethan Stewart is a 75 y.o.  male with a history of :  1 follicular lymphoma -Previously treated with Revlimid and Rituxan, currently on Rituxan maintenance q2 months  . PLAN -Patient notes no acute new symptoms suggestive of follicular lymphoma progression at this time. -Labs done today were reviewed and show stable CBC CMP and normal LDH levels. -He did have a CT chest abdomen pelvis on 06/27/2021 which showed no evidence of lymphoma recurrence on his CT chest abdomen pelvis. -No regular toxicities from his Rituxan and no issues with infections. -We will continue his maintenance Rituxan to complete a total of 3 years of treatment.  2.  His history of diffuse large B-cell lymphoma currently in remission  Follow-up  -Plz schedule next 2 cycles of maintenance Rituxan every 2 months with labs and MD visits   All questions were answered. The patient knows to call the clinic with any problems, questions or concerns. The total time spent in the appointment was 30 minutes.   . The total time spent in the appointment was 30 minutes and more than 50% was on counseling and direct patient cares, discussing CT chest abdomen pelvis results, ordering and management of Rituxan.  Sullivan Lone MD MS Hematology/Oncology Physician [facility]

## 2021-09-25 DIAGNOSIS — E7800 Pure hypercholesterolemia, unspecified: Secondary | ICD-10-CM | POA: Diagnosis not present

## 2021-09-25 DIAGNOSIS — N400 Enlarged prostate without lower urinary tract symptoms: Secondary | ICD-10-CM | POA: Diagnosis not present

## 2021-09-25 DIAGNOSIS — E038 Other specified hypothyroidism: Secondary | ICD-10-CM | POA: Diagnosis not present

## 2021-10-08 DIAGNOSIS — L720 Epidermal cyst: Secondary | ICD-10-CM | POA: Diagnosis not present

## 2021-10-08 DIAGNOSIS — C44622 Squamous cell carcinoma of skin of right upper limb, including shoulder: Secondary | ICD-10-CM | POA: Diagnosis not present

## 2021-10-12 DIAGNOSIS — E7800 Pure hypercholesterolemia, unspecified: Secondary | ICD-10-CM | POA: Diagnosis not present

## 2021-10-12 DIAGNOSIS — E038 Other specified hypothyroidism: Secondary | ICD-10-CM | POA: Diagnosis not present

## 2021-10-12 DIAGNOSIS — Z125 Encounter for screening for malignant neoplasm of prostate: Secondary | ICD-10-CM | POA: Diagnosis not present

## 2021-10-17 DIAGNOSIS — N400 Enlarged prostate without lower urinary tract symptoms: Secondary | ICD-10-CM | POA: Diagnosis not present

## 2021-10-17 DIAGNOSIS — Z125 Encounter for screening for malignant neoplasm of prostate: Secondary | ICD-10-CM | POA: Diagnosis not present

## 2021-10-17 DIAGNOSIS — E7800 Pure hypercholesterolemia, unspecified: Secondary | ICD-10-CM | POA: Diagnosis not present

## 2021-10-17 DIAGNOSIS — Z79899 Other long term (current) drug therapy: Secondary | ICD-10-CM | POA: Diagnosis not present

## 2021-10-17 DIAGNOSIS — C8590 Non-Hodgkin lymphoma, unspecified, unspecified site: Secondary | ICD-10-CM | POA: Diagnosis not present

## 2021-10-17 DIAGNOSIS — R69 Illness, unspecified: Secondary | ICD-10-CM | POA: Diagnosis not present

## 2021-10-17 DIAGNOSIS — Z0000 Encounter for general adult medical examination without abnormal findings: Secondary | ICD-10-CM | POA: Diagnosis not present

## 2021-10-17 DIAGNOSIS — G8191 Hemiplegia, unspecified affecting right dominant side: Secondary | ICD-10-CM | POA: Diagnosis not present

## 2021-10-17 DIAGNOSIS — E038 Other specified hypothyroidism: Secondary | ICD-10-CM | POA: Diagnosis not present

## 2021-10-17 DIAGNOSIS — G479 Sleep disorder, unspecified: Secondary | ICD-10-CM | POA: Diagnosis not present

## 2021-10-22 ENCOUNTER — Other Ambulatory Visit: Payer: Self-pay

## 2021-10-22 ENCOUNTER — Inpatient Hospital Stay: Payer: No Typology Code available for payment source | Attending: Hematology

## 2021-10-22 ENCOUNTER — Inpatient Hospital Stay: Payer: No Typology Code available for payment source

## 2021-10-22 ENCOUNTER — Inpatient Hospital Stay (HOSPITAL_BASED_OUTPATIENT_CLINIC_OR_DEPARTMENT_OTHER): Payer: No Typology Code available for payment source | Admitting: Hematology

## 2021-10-22 VITALS — BP 123/72 | HR 62 | Temp 98.3°F | Resp 18

## 2021-10-22 VITALS — BP 152/82 | HR 59 | Temp 97.5°F | Resp 17 | Wt 219.6 lb

## 2021-10-22 DIAGNOSIS — Z5112 Encounter for antineoplastic immunotherapy: Secondary | ICD-10-CM | POA: Insufficient documentation

## 2021-10-22 DIAGNOSIS — C8298 Follicular lymphoma, unspecified, lymph nodes of multiple sites: Secondary | ICD-10-CM

## 2021-10-22 DIAGNOSIS — Z79899 Other long term (current) drug therapy: Secondary | ICD-10-CM | POA: Diagnosis not present

## 2021-10-22 DIAGNOSIS — Z298 Encounter for other specified prophylactic measures: Secondary | ICD-10-CM | POA: Diagnosis not present

## 2021-10-22 DIAGNOSIS — C8330 Diffuse large B-cell lymphoma, unspecified site: Secondary | ICD-10-CM

## 2021-10-22 LAB — CMP (CANCER CENTER ONLY)
ALT: 24 U/L (ref 0–44)
AST: 26 U/L (ref 15–41)
Albumin: 4.3 g/dL (ref 3.5–5.0)
Alkaline Phosphatase: 79 U/L (ref 38–126)
Anion gap: 7 (ref 5–15)
BUN: 13 mg/dL (ref 8–23)
CO2: 26 mmol/L (ref 22–32)
Calcium: 9.3 mg/dL (ref 8.9–10.3)
Chloride: 109 mmol/L (ref 98–111)
Creatinine: 0.99 mg/dL (ref 0.61–1.24)
GFR, Estimated: >60 mL/min (ref >60)
Glucose, Bld: 95 mg/dL (ref 70–99)
Potassium: 4.4 mmol/L (ref 3.5–5.1)
Sodium: 142 mmol/L (ref 135–145)
Total Bilirubin: 0.6 mg/dL (ref 0.3–1.2)
Total Protein: 6.6 g/dL (ref 6.5–8.1)

## 2021-10-22 LAB — CBC WITH DIFFERENTIAL/PLATELET
Abs Immature Granulocytes: 0.01 10*3/uL (ref 0.00–0.07)
Basophils Absolute: 0 10*3/uL (ref 0.0–0.1)
Basophils Relative: 1 %
Eosinophils Absolute: 0.1 10*3/uL (ref 0.0–0.5)
Eosinophils Relative: 1 %
HCT: 43.8 % (ref 39.0–52.0)
Hemoglobin: 15.4 g/dL (ref 13.0–17.0)
Immature Granulocytes: 0 %
Lymphocytes Relative: 20 %
Lymphs Abs: 1.2 10*3/uL (ref 0.7–4.0)
MCH: 30.7 pg (ref 26.0–34.0)
MCHC: 35.2 g/dL (ref 30.0–36.0)
MCV: 87.4 fL (ref 80.0–100.0)
Monocytes Absolute: 0.7 10*3/uL (ref 0.1–1.0)
Monocytes Relative: 12 %
Neutro Abs: 4 10*3/uL (ref 1.7–7.7)
Neutrophils Relative %: 66 %
Platelets: 130 10*3/uL — ABNORMAL LOW (ref 150–400)
RBC: 5.01 MIL/uL (ref 4.22–5.81)
RDW: 13 % (ref 11.5–15.5)
WBC: 6 10*3/uL (ref 4.0–10.5)
nRBC: 0 % (ref 0.0–0.2)

## 2021-10-22 LAB — LACTATE DEHYDROGENASE: LDH: 182 U/L (ref 98–192)

## 2021-10-22 MED ORDER — DIPHENHYDRAMINE HCL 25 MG PO CAPS
50.0000 mg | ORAL_CAPSULE | Freq: Once | ORAL | Status: AC
  Administered 2021-10-22: 50 mg via ORAL
  Filled 2021-10-22: qty 2

## 2021-10-22 MED ORDER — SODIUM CHLORIDE 0.9 % IV SOLN
375.0000 mg/m2 | Freq: Once | INTRAVENOUS | Status: AC
  Administered 2021-10-22: 900 mg via INTRAVENOUS
  Filled 2021-10-22: qty 50

## 2021-10-22 MED ORDER — SODIUM CHLORIDE 0.9 % IV SOLN: Freq: Once | INTRAVENOUS | Status: AC

## 2021-10-22 MED ORDER — METHYLPREDNISOLONE SODIUM SUCC 125 MG IJ SOLR
125.0000 mg | Freq: Once | INTRAMUSCULAR | Status: AC
  Administered 2021-10-22: 125 mg via INTRAVENOUS
  Filled 2021-10-22: qty 2

## 2021-10-22 MED ORDER — ACETAMINOPHEN 325 MG PO TABS
650.0000 mg | ORAL_TABLET | Freq: Once | ORAL | Status: AC
  Administered 2021-10-22: 650 mg via ORAL
  Filled 2021-10-22: qty 2

## 2021-10-22 NOTE — Patient Instructions (Signed)
[facility]ICAL [facility]COL[facility]   Discharge Instructions: Thank you for choosing [facility] to provide your oncology and hematology care.   If you have a lab appointment with the Cancer Center, please go directly to the Cancer Center and check in at the registration area.   Wear comfortable clothing and clothing appropriate for easy access to any Portacath or PICC line.   We strive to give you quality time with your provider. You may need to reschedule your appointment if you arrive late (15 or more minutes).  Arriving late affects you and other patients whose appointments are after yours.  Also, if you miss three or more appointments without notifying the office, you may be dismissed from the clinic at the provider's discretion.      For prescription refill requests, have your pharmacy contact our office and allow 72 hours for refills to be completed.    Today you received the following chemotherapy and/or immunotherapy agents: Rituximab (Rituxan)      To help prevent nausea and vomiting after your treatment, we encourage you to take your nausea medication as directed.  BELOW ARE SYMPTOMS THAT SHOULD BE REPORTED IMMEDIATELY: *FEVER GREATER THAN 100.4 F (38 C) OR HIGHER *CHILLS OR SWEATING *NAUSEA AND VOMITING THAT IS NOT CONTROLLED WITH YOUR NAUSEA MEDICATION *UNUSUAL SHORTNESS OF BREATH *UNUSUAL BRUISING OR BLEEDING *URINARY PROBLEMS (pain or burning when urinating, or frequent urination) *BOWEL PROBLEMS (unusual diarrhea, constipation, pain near the anus) TENDERNESS IN MOUTH AND THROAT WITH OR WITHOUT PRESENCE OF ULCERS (sore throat, sores in mouth, or a toothache) UNUSUAL RASH, SWELLING OR PAIN  UNUSUAL VAGINAL DISCHARGE OR ITCHING   Items with * indicate a potential emergency and should be followed up as soon as possible or go to the Emergency Department if any problems should occur.  Please show the CHEMOTHERAPY ALERT CARD or IMMUNOTHERAPY ALERT CARD at  check-in to the Emergency Department and triage nurse.  Should you have questions after your visit or need to cancel or reschedule your appointment, please contact [facility]  Dept: [phone]  and follow the prompts.  Office hours are 8:00 a.m. to 4:30 p.m. Monday - Friday. Please note that voicemails left after 4:00 p.m. may not be returned until the following business day.  We are closed weekends and major holidays. You have access to a nurse at all times for urgent questions. Please call the main number to the clinic Dept: [phone] and follow the prompts.   For any non-urgent questions, you may also contact your provider using MyChart. We now offer e-Visits for anyone 18 and older to request care online for non-urgent symptoms. For details visit [email or web address].   Also download the MyChart app! Go to the app store, search "MyChart", open the app, select , and log in with your MyChart username and password.  Due to Covid, a mask is required upon entering the hospital/clinic. If you do not have a mask, one will be given to you upon arrival. For doctor visits, patients may have 1 support person aged 18 or older with them. For treatment visits, patients cannot have anyone with them due to current Covid guidelines and our immunocompromised population.  

## 2021-10-23 ENCOUNTER — Telehealth: Payer: Self-pay | Admitting: Hematology

## 2021-10-23 NOTE — Telephone Encounter (Signed)
Scheduled follow-up appointment per 10/31 los. Patient is aware. 

## 2021-10-28 ENCOUNTER — Encounter: Payer: Self-pay | Admitting: Hematology

## 2021-10-28 NOTE — Progress Notes (Addendum)
[name]   Telephone:(336) [phone] Fax:(336) [place] Note   Patient Care Team: Lawerance Cruel, MD as PCP - General (Family Medicine)  Date of Service:  .10/22/2021   CHIEF COMPLAINTS/PURPOSE OF CONSULTATION:  F/u for Non-Hodgkin's Lymphoma on continued Rituximab q51months   Oncology History  DLBCL (diffuse large B cell lymphoma) ([place])  06/21/2015 Imaging   CT Chest/abd/pelvis: IMPRESSION: 5 mm nodule within the right lower lobe as described.   Pneumobilia consistent with a prior sphincterotomy.   No acute abnormality is identified. No findings to suggest chest etiology for the known lesion at C5 are seen.   06/21/2015 Imaging   MRI c spine1. Diffuse marrow replacement of the C5 vertebral body highly concerning for neoplasm (metastatic disease, myeloma, or lymphoma). Epidural tumor at this level results in moderate spinal stenosis with moderate impression on the spinal cord    07/10/2015 PET scan   1. The previously demonstrated abnormal C5 vertebral body is hypermetabolic. No other osseous lesions demonstrated. 2. There are small hypermetabolic lymph nodes within the left mesentery and both inguinal regions.    07/26/2015 Initial Biopsy   Had a C5 corpectomy pathology showed diffuse large B-cell lymphoma   08/08/2015 -  Chemotherapy   Started for cycle of R CHOP.    08/09/2015 -  Chemotherapy   Received first cycle of intrathecal methotrexate plus hydrocortisone for CNS prophylaxis.   02/05/2019 -  Chemotherapy   Patient is on Treatment Plan : NON-HODGKINS LYMPHOMA Rituximab q month        HISTORY OF PRESENTING ILLNESS:  Ethan Stewart 75 y.o. male is here for follow-up prior to his next cycle of maintenance rituximab for follicular lymphoma.  He notes no acute issues from his previous dose of Rituxan. He notes his energy levels have been good.  Good p.o. intake. No fevers no chills no night sweats. no unexpected weight loss no new  fatigue no new lumps or bumps.  Labwork today 10/22/2021 shows normal CBC with mild chronic thrombocytopenia with platelets of 130k, CMP within normal limits.  LDH within normal limits at 182.   REVIEW OF SYSTEMS: 10 Point review of Systems was done is negative except as noted above.  MEDICAL HISTORY:  Past Medical History:  Diagnosis Date   Anxiety    ARDS (adult respiratory distress syndrome) ([place])    2016 August at [facility]   Arm numbness 08/07/15   Limited movement due to tumor on spine   B-cell lymphoma ([place])    Bone cancer ([place])     tumor on C5   Cervical spine tumor 07/03/2015   Diffuse large B cell lymphoma ([place])    biposy 07/25/2105   GERD (gastroesophageal reflux disease)    H. pylori infection    History of bleeding ulcers 1990's   History of blood transfusion    Hypothyroidism     SURGICAL HISTORY: Past Surgical History:  Procedure Laterality Date   ANTERIOR CERVICAL CORPECTOMY N/A 07/26/2015   Procedure: Cervical five Corpectomy/Cervical four-six Fusion/Plate ;  Surgeon: Eustace Moore, MD;  Location: [facility];  Service: Neurosurgery;  Laterality: N/A;  C5 Corpectomy/C4-6 Fusion/Plate C4-6   COLONOSCOPY     EYE SURGERY Right    cataracts   HERNIA REPAIR Bilateral    with mesh   IR GENERIC HISTORICAL  02/03/2017   IR REMOVAL TUN ACCESS W/ PORT W/O FL MOD SED 02/03/2017 Markus Daft, MD [facility]INTER[facility]   IR REMOVAL TUN ACCESS  W/ PORT W/O FL MOD SED  06/28/2020   LYMPH NODE BIOPSY Right 12/11/2018   Procedure: EXCISIONAL BIOPSY OF DEEP RIGHT INGUINAL LYMPH NODE ERAS PATHWAY;  Surgeon: Fanny Skates, MD;  Location: [place];  Service: General;  Laterality: Right;    SOCIAL HISTORY: Social History   Socioeconomic History   Marital status: Married    Spouse name: Not on file   Number of children: Not on file   Years of education: Not on file   Highest education level: Not on file  Occupational History   Not on file  Tobacco Use   Smoking status: Former    Packs/day:  1.00    Years: 10.00    Pack years: 10.00    Types: Cigarettes    Quit date: 12/23/1981    Years since quitting: 39.8   Smokeless tobacco: Never  Vaping Use   Vaping Use: Never used  Substance and Sexual Activity   Alcohol use: Yes    Alcohol/week: 5.0 standard drinks    Types: 2 Glasses of wine, 3 Cans of beer per week   Drug use: No   Sexual activity: Yes  Other Topics Concern   Not on file  Social History Narrative   Not on file   Social Determinants of Health   Financial Resource Strain: Not on file  Food Insecurity: Not on file  Transportation Needs: Not on file  Physical Activity: Not on file  Stress: Not on file  Social Connections: Not on file  Intimate Partner Violence: Not on file    FAMILY HISTORY: Family History  Problem Relation Age of Onset   Hypertension Mother     ALLERGIES:  is allergic to pegfilgrastim.  MEDICATIONS:  Current Outpatient Medications  Medication Sig Dispense Refill   cholecalciferol (VITAMIN D3) 25 MCG (1000 UNIT) tablet 1 tablet     diazepam (VALIUM) 2 MG tablet Take 2 mg by mouth daily as needed for anxiety.     ergocalciferol (VITAMIN D2) 1.25 MG (50000 UT) capsule Take 1 capsule (50,000 Units total) by mouth once a week. 12 capsule 4   levothyroxine (SYNTHROID, LEVOTHROID) 88 MCG tablet Take 88 mcg by mouth daily before breakfast.   2   Multiple Vitamins-Minerals (CENTRUM SILVER PO) Take by mouth daily.     polyethylene glycol powder (GLYCOLAX/MIRALAX) 17 GM/SCOOP powder TAKE AS DIRECTED BY MOUTH     rivaroxaban (XARELTO) 10 MG TABS tablet Take 1 tablet by mouth daily.     simvastatin (ZOCOR) 20 MG tablet Take 20 mg by mouth daily.     simvastatin (ZOCOR) 40 MG tablet Take 0.5 tablets by mouth at bedtime.     tamsulosin (FLOMAX) 0.4 MG CAPS capsule Take 1 capsule (0.4 mg total) by mouth at bedtime. 30 capsule 3   temazepam (RESTORIL) 15 MG capsule Take 15 mg by mouth at bedtime as needed for sleep.      No current  facility-administered medications for this visit.    PHYSICAL EXAMINATION: ECOG PERFORMANCE STATUS: 0 .BP (!) 152/82 (BP Location: Right Arm, Patient Position: Sitting)   Pulse (!) 59   Temp (!) 97.5 F (36.4 C) (Tympanic)   Resp 17   Wt 219 lb 9 oz (99.6 kg)   SpO2 100%   BMI 28.97 kg/m  . GENERAL:alert, in no acute distress and comfortable SKIN: no acute rashes, no significant lesions EYES: conjunctiva are pink and non-injected, sclera anicteric OROPHARYNX: MMM, no exudates, no oropharyngeal erythema or ulceration NECK: supple, no JVD LYMPH:  no palpable lymphadenopathy in the cervical, axillary or inguinal regions LUNGS: clear to auscultation b/l with normal respiratory effort HEART: regular rate & rhythm ABDOMEN:  normoactive bowel sounds , non tender, not distended. Extremity: no pedal edema PSYCH: alert & oriented x 3 with fluent speech NEURO: no focal motor/sensory deficits   LABORATORY DATA:   I have reviewed the data as listed CBC Latest Ref Rng & Units 10/22/2021 08/22/2021 06/20/2021  WBC 4.0 - 10.5 K/uL 6.0 3.8(L) 3.6(L)  Hemoglobin 13.0 - 17.0 g/dL 15.4 14.6 14.4  Hematocrit 39.0 - 52.0 % 43.8 41.8 40.6  Platelets 150 - 400 K/uL 130(L) 132(L) 137(L)    CMP Latest Ref Rng & Units 10/22/2021 08/22/2021 06/20/2021  Glucose 70 - 99 mg/dL 95 108(H) 90  BUN 8 - 23 mg/dL 13 16 17   Creatinine 0.61 - 1.24 mg/dL 0.99 1.01 1.04  Sodium 135 - 145 mmol/L 142 142 141  Potassium 3.5 - 5.1 mmol/L 4.4 4.3 4.4  Chloride 98 - 111 mmol/L 109 110 109  CO2 22 - 32 mmol/L 26 24 23   Calcium 8.9 - 10.3 mg/dL 9.3 9.1 9.1  Total Protein 6.5 - 8.1 g/dL 6.6 6.3(L) 6.3(L)  Total Bilirubin 0.3 - 1.2 mg/dL 0.6 0.6 0.6  Alkaline Phos 38 - 126 U/L 79 67 74  AST 15 - 41 U/L 26 25 24   ALT 0 - 44 U/L 24 26 22     Lab Results  Component Value Date   LDH 182 10/22/2021     RADIOGRAPHIC STUDIES: I have personally reviewed the radiological images as listed and agreed with the findings in  the report. No results found.  ASSESSMENT & PLAN:  Ethan Stewart is a 75 y.o.  male with a history of :  1 Follicular lymphoma -Previously treated with Revlimid and Rituxan, currently on Rituxan maintenance q2 months . CT chest abdomen pelvis on 06/27/2021 which showed no evidence of lymphoma recurrence on his CT chest abdomen pelvis. PLAN -Patient notes no acute new symptoms suggestive of follicular lymphoma progression at this time. -Labs done today were reviewed and show stable CBC CMP and normal LDH levels. -He no significant  toxicities from his Rituxan and no issues with infections. -We will continue his maintenance Rituxan to complete a total of 3 years of treatment.  2.   history of diffuse large B-cell lymphoma currently in remission  Follow-up note: -Plz schedule next 2 cycles of maintenance Rituxan every 2 months with labs and MD visits     All questions were answered. The patient knows to call the clinic with any problems, questions or concerns. . The total time spent in the appointment was 30 minutes and more than 50% was on counseling and direct patient cares, ordering, toxicity assessment and management of Rituxan  .  Sullivan Lone MD MS Hematology/Oncology Physician [facility].

## 2021-12-19 DIAGNOSIS — E038 Other specified hypothyroidism: Secondary | ICD-10-CM | POA: Diagnosis not present

## 2021-12-25 ENCOUNTER — Other Ambulatory Visit: Payer: Self-pay

## 2021-12-25 ENCOUNTER — Inpatient Hospital Stay: Payer: No Typology Code available for payment source

## 2021-12-25 ENCOUNTER — Inpatient Hospital Stay (HOSPITAL_BASED_OUTPATIENT_CLINIC_OR_DEPARTMENT_OTHER): Payer: No Typology Code available for payment source | Admitting: Hematology

## 2021-12-25 ENCOUNTER — Inpatient Hospital Stay: Payer: No Typology Code available for payment source | Attending: Hematology

## 2021-12-25 VITALS — BP 137/79 | HR 64 | Temp 97.7°F | Resp 18

## 2021-12-25 VITALS — BP 138/81 | HR 64 | Temp 97.9°F | Resp 18 | Ht 73.0 in | Wt 224.2 lb

## 2021-12-25 DIAGNOSIS — Z5111 Encounter for antineoplastic chemotherapy: Secondary | ICD-10-CM | POA: Insufficient documentation

## 2021-12-25 DIAGNOSIS — Z79899 Other long term (current) drug therapy: Secondary | ICD-10-CM | POA: Insufficient documentation

## 2021-12-25 DIAGNOSIS — Z298 Encounter for other specified prophylactic measures: Secondary | ICD-10-CM

## 2021-12-25 DIAGNOSIS — Z5112 Encounter for antineoplastic immunotherapy: Secondary | ICD-10-CM

## 2021-12-25 DIAGNOSIS — C8298 Follicular lymphoma, unspecified, lymph nodes of multiple sites: Secondary | ICD-10-CM

## 2021-12-25 DIAGNOSIS — C8330 Diffuse large B-cell lymphoma, unspecified site: Secondary | ICD-10-CM

## 2021-12-25 LAB — CBC WITH DIFFERENTIAL/PLATELET
Abs Immature Granulocytes: 0.02 10*3/uL (ref 0.00–0.07)
Basophils Absolute: 0.1 10*3/uL (ref 0.0–0.1)
Basophils Relative: 1 %
Eosinophils Absolute: 0.1 10*3/uL (ref 0.0–0.5)
Eosinophils Relative: 3 %
HCT: 42.3 % (ref 39.0–52.0)
Hemoglobin: 14.5 g/dL (ref 13.0–17.0)
Immature Granulocytes: 0 %
Lymphocytes Relative: 20 %
Lymphs Abs: 1 10*3/uL (ref 0.7–4.0)
MCH: 30 pg (ref 26.0–34.0)
MCHC: 34.3 g/dL (ref 30.0–36.0)
MCV: 87.4 fL (ref 80.0–100.0)
Monocytes Absolute: 0.7 10*3/uL (ref 0.1–1.0)
Monocytes Relative: 13 %
Neutro Abs: 3.3 10*3/uL (ref 1.7–7.7)
Neutrophils Relative %: 63 %
Platelets: 164 10*3/uL (ref 150–400)
RBC: 4.84 MIL/uL (ref 4.22–5.81)
RDW: 12.6 % (ref 11.5–15.5)
WBC: 5.2 10*3/uL (ref 4.0–10.5)
nRBC: 0 % (ref 0.0–0.2)

## 2021-12-25 LAB — CMP (CANCER CENTER ONLY)
ALT: 24 U/L (ref 0–44)
AST: 23 U/L (ref 15–41)
Albumin: 4.1 g/dL (ref 3.5–5.0)
Alkaline Phosphatase: 76 U/L (ref 38–126)
Anion gap: 7 (ref 5–15)
BUN: 23 mg/dL (ref 8–23)
CO2: 26 mmol/L (ref 22–32)
Calcium: 9.3 mg/dL (ref 8.9–10.3)
Chloride: 107 mmol/L (ref 98–111)
Creatinine: 1.09 mg/dL (ref 0.61–1.24)
GFR, Estimated: >60 mL/min (ref >60)
Glucose, Bld: 107 mg/dL — ABNORMAL HIGH (ref 70–99)
Potassium: 4.2 mmol/L (ref 3.5–5.1)
Sodium: 140 mmol/L (ref 135–145)
Total Bilirubin: 0.5 mg/dL (ref 0.3–1.2)
Total Protein: 6.4 g/dL — ABNORMAL LOW (ref 6.5–8.1)

## 2021-12-25 LAB — LACTATE DEHYDROGENASE: LDH: 140 U/L (ref 98–192)

## 2021-12-25 MED ORDER — METHYLPREDNISOLONE SODIUM SUCC 125 MG IJ SOLR
125.0000 mg | Freq: Once | INTRAMUSCULAR | Status: AC
  Administered 2021-12-25: 125 mg via INTRAVENOUS
  Filled 2021-12-25: qty 2

## 2021-12-25 MED ORDER — SODIUM CHLORIDE 0.9 % IV SOLN
375.0000 mg/m2 | Freq: Once | INTRAVENOUS | Status: AC
  Administered 2021-12-25: 900 mg via INTRAVENOUS
  Filled 2021-12-25: qty 50

## 2021-12-25 MED ORDER — ACETAMINOPHEN 325 MG PO TABS
650.0000 mg | ORAL_TABLET | Freq: Once | ORAL | Status: AC
  Administered 2021-12-25: 650 mg via ORAL
  Filled 2021-12-25: qty 2

## 2021-12-25 MED ORDER — SODIUM CHLORIDE 0.9 % IV SOLN: Freq: Once | INTRAVENOUS | Status: AC

## 2021-12-25 MED ORDER — DIPHENHYDRAMINE HCL 25 MG PO CAPS
50.0000 mg | ORAL_CAPSULE | Freq: Once | ORAL | Status: AC
  Administered 2021-12-25: 50 mg via ORAL
  Filled 2021-12-25: qty 2

## 2021-12-25 NOTE — Patient Instructions (Signed)
[facility]  Discharge Instructions: Thank you for choosing [facility] to provide your oncology and hematology care.   If you have a lab appointment with the Cancer Center, please go directly to the Cancer Center and check in at the registration area.   Wear comfortable clothing and clothing appropriate for easy access to any Portacath or PICC line.   We strive to give you quality time with your provider. You may need to reschedule your appointment if you arrive late (15 or more minutes).  Arriving late affects you and other patients whose appointments are after yours.  Also, if you miss three or more appointments without notifying the office, you may be dismissed from the clinic at the provider's discretion.      For prescription refill requests, have your pharmacy contact our office and allow 72 hours for refills to be completed.    Today you received the following chemotherapy and/or immunotherapy agents Rituxan      To help prevent nausea and vomiting after your treatment, we encourage you to take your nausea medication as directed.  BELOW ARE SYMPTOMS THAT SHOULD BE REPORTED IMMEDIATELY: *FEVER GREATER THAN 100.4 F (38 C) OR HIGHER *CHILLS OR SWEATING *NAUSEA AND VOMITING THAT IS NOT CONTROLLED WITH YOUR NAUSEA MEDICATION *UNUSUAL SHORTNESS OF BREATH *UNUSUAL BRUISING OR BLEEDING *URINARY PROBLEMS (pain or burning when urinating, or frequent urination) *BOWEL PROBLEMS (unusual diarrhea, constipation, pain near the anus) TENDERNESS IN MOUTH AND THROAT WITH OR WITHOUT PRESENCE OF ULCERS (sore throat, sores in mouth, or a toothache) UNUSUAL RASH, SWELLING OR PAIN  UNUSUAL VAGINAL DISCHARGE OR ITCHING   Items with * indicate a potential emergency and should be followed up as soon as possible or go to the Emergency Department if any problems should occur.  Please show the CHEMOTHERAPY ALERT CARD or IMMUNOTHERAPY ALERT CARD at check-in to the  Emergency Department and triage nurse.  Should you have questions after your visit or need to cancel or reschedule your appointment, please contact [facility]  Dept: [phone]  and follow the prompts.  Office hours are 8:00 a.m. to 4:30 p.m. Monday - Friday. Please note that voicemails left after 4:00 p.m. may not be returned until the following business day.  We are closed weekends and major holidays. You have access to a nurse at all times for urgent questions. Please call the main number to the clinic Dept: [phone] and follow the prompts.   For any non-urgent questions, you may also contact your provider using MyChart. We now offer e-Visits for anyone 18 and older to request care online for non-urgent symptoms. For details visit [email or web address].   Also download the MyChart app! Go to the app store, search "MyChart", open the app, select [facility], and log in with your MyChart username and password.  Due to Covid, a mask is required upon entering the hospital/clinic. If you do not have a mask, one will be given to you upon arrival. For doctor visits, patients may have 1 support person aged 18 or older with them. For treatment visits, patients cannot have anyone with them due to current Covid guidelines and our immunocompromised population.   

## 2021-12-25 NOTE — Progress Notes (Signed)
Ethan Stewart   [phone] Fax:([phone]) [phone]   Hematology oncology clinic note  Patient Care Team: Lawerance Cruel, MD as PCP - General (Family Medicine)  Date of Service:  .12/25/2021   CHIEF COMPLAINTS/PURPOSE OF CONSULTATION:  Follow-up for follicular lymphoma and next dose of maintenance Rituxan   Oncology History  DLBCL (diffuse large B cell lymphoma) ([place])  06/21/2015 Imaging   CT Chest/abd/pelvis: IMPRESSION: 5 mm nodule within the right lower lobe as described.   Pneumobilia consistent with a prior sphincterotomy.   No acute abnormality is identified. No findings to suggest chest etiology for the known lesion at C5 are seen.   06/21/2015 Imaging   MRI c spine1. Diffuse marrow replacement of the C5 vertebral body highly concerning for neoplasm (metastatic disease, myeloma, or lymphoma). Epidural tumor at this level results in moderate spinal stenosis with moderate impression on the spinal cord    07/10/2015 PET scan   1. The previously demonstrated abnormal C5 vertebral body is hypermetabolic. No other osseous lesions demonstrated. 2. There are small hypermetabolic lymph nodes within the left mesentery and both inguinal regions.    07/26/2015 Initial Biopsy   Had a C5 corpectomy pathology showed diffuse large B-cell lymphoma   08/08/2015 -  Chemotherapy   Started for cycle of R CHOP.    08/09/2015 -  Chemotherapy   Received first cycle of intrathecal methotrexate plus hydrocortisone for CNS prophylaxis.   02/05/2019 -  Chemotherapy   Patient is on Treatment Plan : NON-HODGKINS LYMPHOMA Rituximab q month        HISTORY OF PRESENTING ILLNESS:  Ethan Stewart is a 76 y.o. male who is here for follow-up of his follicular lymphoma and large B-cell lymphoma and neck cycle of maintenance Rituxan. He notes he has been doing well and has no acute new concerns. No fevers no chills no night sweats no new lumps or bumps. He is accompanied by his wife  Ethan Stewart. He talks about all the gratitude he has for all his treatment thus far and having made it through a difficult period in his life from a medical standpoint. He notes he has been enjoying his retirement and is very grateful for this. His labs today are stable. We discussed that the next dose of Rituxan would be his last planned maintenance treatment completing 3 years of treatment and that we would recommend getting a CT chest abdomen pelvis prior to discontinuing his maintenance therapy.  He is in agreement with this.    REVIEW OF SYSTEMS: .10 Point review of Systems was done is negative except as noted above.    MEDICAL HISTORY:  Past Medical History:  Diagnosis Date   Anxiety    ARDS (adult respiratory distress syndrome) ([place])    2016 August at [facility]   Arm numbness 08/07/15   Limited movement due to tumor on spine   B-cell lymphoma ([place])    Bone cancer ([place])     tumor on C5   Cervical spine tumor 07/03/2015   Diffuse large B cell lymphoma ([place])    biposy 07/25/2105   GERD (gastroesophageal reflux disease)    H. pylori infection    History of bleeding ulcers 1990's   History of blood transfusion    Hypothyroidism     SURGICAL HISTORY: Past Surgical History:  Procedure Laterality Date   ANTERIOR CERVICAL CORPECTOMY N/A 07/26/2015   Procedure: Cervical five Corpectomy/Cervical four-six Fusion/Plate ;  Surgeon: Eustace Moore, MD;  Location: [facility];  Service: Neurosurgery;  Laterality: N/A;  C5 Corpectomy/C4-6 Fusion/Plate C4-6   COLONOSCOPY     EYE SURGERY Right    cataracts   HERNIA REPAIR Bilateral    with mesh   IR GENERIC HISTORICAL  02/03/2017   IR REMOVAL TUN ACCESS W/ PORT W/O FL MOD SED 02/03/2017 Markus Daft, MD [facility]-INTER[facility] RAD   IR REMOVAL TUN ACCESS W/ PORT W/O FL MOD SED  06/28/2020   LYMPH NODE BIOPSY Right 12/11/2018   Procedure: EXCISIONAL BIOPSY OF DEEP RIGHT INGUINAL LYMPH NODE ERAS PATHWAY;  Surgeon: Fanny Skates, MD;  Location: [place];  Service: General;   Laterality: Right;    SOCIAL HISTORY: Social History   Socioeconomic History   Marital status: Married    Spouse name: Not on file   Number of children: Not on file   Years of education: Not on file   Highest education level: Not on file  Occupational History   Not on file  Tobacco Use   Smoking status: Former    Packs/day: 1.00    Years: 10.00    Pack years: 10.00    Types: Cigarettes    Quit date: 12/23/1981    Years since quitting: 40.0   Smokeless tobacco: Never  Vaping Use   Vaping Use: Never used  Substance and Sexual Activity   Alcohol use: Yes    Alcohol/week: 5.0 standard drinks    Types: 2 Glasses of wine, 3 Cans of beer per week   Drug use: No   Sexual activity: Yes  Other Topics Concern   Not on file  Social History Narrative   Not on file   Social Determinants of Health   Financial Resource Strain: Not on file  Food Insecurity: Not on file  Transportation Needs: Not on file  Physical Activity: Not on file  Stress: Not on file  Social Connections: Not on file  Intimate Partner Violence: Not on file    FAMILY HISTORY: Family History  Problem Relation Age of Onset   Hypertension Mother     ALLERGIES:  is allergic to pegfilgrastim.  MEDICATIONS:  Current Outpatient Medications  Medication Sig Dispense Refill   cholecalciferol (VITAMIN D3) 25 MCG (1000 UNIT) tablet 1 tablet     diazepam (VALIUM) 2 MG tablet Take 2 mg by mouth daily as needed for anxiety.     ergocalciferol (VITAMIN D2) 1.25 MG (50000 UT) capsule Take 1 capsule (50,000 Units total) by mouth once a week. 12 capsule 4   levothyroxine (SYNTHROID, LEVOTHROID) 88 MCG tablet Take 88 mcg by mouth daily before breakfast.   2   Multiple Vitamins-Minerals (CENTRUM SILVER PO) Take by mouth daily.     polyethylene glycol powder (GLYCOLAX/MIRALAX) 17 GM/SCOOP powder TAKE AS DIRECTED BY MOUTH     rivaroxaban (XARELTO) 10 MG TABS tablet Take 1 tablet by mouth daily.     simvastatin (ZOCOR) 20  MG tablet Take 20 mg by mouth daily.     simvastatin (ZOCOR) 40 MG tablet Take 0.5 tablets by mouth at bedtime.     tamsulosin (FLOMAX) 0.4 MG CAPS capsule Take 1 capsule (0.4 mg total) by mouth at bedtime. 30 capsule 3   temazepam (RESTORIL) 15 MG capsule Take 15 mg by mouth at bedtime as needed for sleep.      No current facility-administered medications for this visit.    PHYSICAL EXAMINATION: ECOG PERFORMANCE STATUS: 0 .BP 138/81 (BP Location: Left Arm, Patient Position: Sitting)    Pulse 64    Temp 97.9  F (36.6 C) (Temporal)    Resp 18    Ht 6\' 1"  (1.854 m)    Wt 224 lb 3.2 oz (101.7 kg)    SpO2 98%    BMI 29.58 kg/m  . GENERAL:alert, in no acute distress and comfortable SKIN: no acute rashes, no significant lesions EYES: conjunctiva are pink and non-injected, sclera anicteric OROPHARYNX: MMM, no exudates, no oropharyngeal erythema or ulceration NECK: supple, no JVD LYMPH:  no palpable lymphadenopathy in the cervical, axillary or inguinal regions LUNGS: clear to auscultation b/l with normal respiratory effort HEART: regular rate & rhythm ABDOMEN:  normoactive bowel sounds , non tender, not distended. Extremity: no pedal edema PSYCH: alert & oriented x 3 with fluent speech NEURO: no focal motor/sensory deficits  LABORATORY DATA:   I have reviewed the data as listed CBC Latest Ref Rng & Units 12/25/2021 10/22/2021 08/22/2021  WBC 4.0 - 10.5 K/uL 5.2 6.0 3.8(L)  Hemoglobin 13.0 - 17.0 g/dL 14.5 15.4 14.6  Hematocrit 39.0 - 52.0 % 42.3 43.8 41.8  Platelets 150 - 400 K/uL 164 130(L) 132(L)    CMP Latest Ref Rng & Units 12/25/2021 10/22/2021 08/22/2021  Glucose 70 - 99 mg/dL 107(H) 95 108(H)  BUN 8 - 23 mg/dL 23 13 16   Creatinine 0.61 - 1.24 mg/dL 1.09 0.99 1.01  Sodium 135 - 145 mmol/L 140 142 142  Potassium 3.5 - 5.1 mmol/L 4.2 4.4 4.3  Chloride 98 - 111 mmol/L 107 109 110  CO2 22 - 32 mmol/L 26 26 24   Calcium 8.9 - 10.3 mg/dL 9.3 9.3 9.1  Total Protein 6.5 - 8.1 g/dL 6.4(L)  6.6 6.3(L)  Total Bilirubin 0.3 - 1.2 mg/dL 0.5 0.6 0.6  Alkaline Phos 38 - 126 U/L 76 79 67  AST 15 - 41 U/L 23 26 25   ALT 0 - 44 U/L 24 24 26     Lab Results  Component Value Date   LDH 140 12/25/2021    RADIOGRAPHIC STUDIES: I have personally reviewed the radiological images as listed and agreed with the findings in the report. No results found.  ASSESSMENT & PLAN:   Ethan Stewart is a 76 y.o.  male with a history of :  1 Follicular lymphoma -Previously treated with Revlimid and Rituxan, currently on Rituxan maintenance q2 months . CT chest abdomen pelvis on 06/27/2021 which showed no evidence of lymphoma recurrence on his CT chest abdomen pelvis. PLAN -Patient's labs done on 12/25/2021 showed normal CBC and unremarkable CMP. -LDH is within normal limits at 140 -Patient has no lab or clinical evidence of follicular lymphoma progression at this time. -We will get a CT chest abdomen pelvis in 6 weeks -We shall schedule him for his last planned dose of maintenance Rituxan in 2 months to complete 3 years of treatment.  2.   history of diffuse large B-cell lymphoma currently in remission Status post treatment with modified dose reduced R-CHOP  3.  History of Neulasta related ARDS  Follow-up CT chest abdomen pelvis in 6 weeks Please schedule last planned dose of maintenance Rituxan as per orders in about 60 days with labs and MD visit   All questions were answered. The patient knows to call the clinic with any problems, questions or concerns.  Sullivan Lone MD MS Hematology/Oncology Physician [facility].

## 2021-12-26 DIAGNOSIS — Z79620 Long term (current) use of immunosuppressive biologic: Secondary | ICD-10-CM | POA: Diagnosis not present

## 2021-12-26 DIAGNOSIS — R69 Illness, unspecified: Secondary | ICD-10-CM | POA: Diagnosis not present

## 2021-12-26 DIAGNOSIS — C8590 Non-Hodgkin lymphoma, unspecified, unspecified site: Secondary | ICD-10-CM | POA: Diagnosis not present

## 2021-12-26 DIAGNOSIS — E785 Hyperlipidemia, unspecified: Secondary | ICD-10-CM | POA: Diagnosis not present

## 2021-12-26 DIAGNOSIS — Z87891 Personal history of nicotine dependence: Secondary | ICD-10-CM | POA: Diagnosis not present

## 2021-12-26 DIAGNOSIS — Z809 Family history of malignant neoplasm, unspecified: Secondary | ICD-10-CM | POA: Diagnosis not present

## 2021-12-26 DIAGNOSIS — N400 Enlarged prostate without lower urinary tract symptoms: Secondary | ICD-10-CM | POA: Diagnosis not present

## 2021-12-26 DIAGNOSIS — Z008 Encounter for other general examination: Secondary | ICD-10-CM | POA: Diagnosis not present

## 2021-12-26 DIAGNOSIS — E890 Postprocedural hypothyroidism: Secondary | ICD-10-CM | POA: Diagnosis not present

## 2021-12-26 DIAGNOSIS — G4700 Insomnia, unspecified: Secondary | ICD-10-CM | POA: Diagnosis not present

## 2021-12-30 ENCOUNTER — Encounter: Payer: Self-pay | Admitting: Hematology

## 2022-01-23 DIAGNOSIS — U071 COVID-19: Secondary | ICD-10-CM | POA: Diagnosis not present

## 2022-01-23 DIAGNOSIS — Z20822 Contact with and (suspected) exposure to covid-19: Secondary | ICD-10-CM | POA: Diagnosis not present

## 2022-02-04 ENCOUNTER — Other Ambulatory Visit (HOSPITAL_COMMUNITY): Payer: No Typology Code available for payment source

## 2022-02-08 ENCOUNTER — Ambulatory Visit (HOSPITAL_COMMUNITY)
Admission: RE | Admit: 2022-02-08 | Discharge: 2022-02-08 | Disposition: A | Discharge status: Home / Self Care | Payer: No Typology Code available for payment source | Source: Ambulatory Visit | Attending: Hematology | Admitting: Hematology

## 2022-02-08 ENCOUNTER — Other Ambulatory Visit: Payer: Self-pay

## 2022-02-08 ENCOUNTER — Inpatient Hospital Stay: Payer: No Typology Code available for payment source | Attending: Hematology

## 2022-02-08 ENCOUNTER — Encounter: Payer: Self-pay | Admitting: Hematology

## 2022-02-08 DIAGNOSIS — C8298 Follicular lymphoma, unspecified, lymph nodes of multiple sites: Secondary | ICD-10-CM | POA: Diagnosis present

## 2022-02-08 DIAGNOSIS — Z5112 Encounter for antineoplastic immunotherapy: Secondary | ICD-10-CM | POA: Insufficient documentation

## 2022-02-08 LAB — CBC WITH DIFFERENTIAL/PLATELET
Abs Immature Granulocytes: 0 10*3/uL (ref 0.00–0.07)
Basophils Absolute: 0 10*3/uL (ref 0.0–0.1)
Basophils Relative: 0 %
Eosinophils Absolute: 0 10*3/uL (ref 0.0–0.5)
Eosinophils Relative: 1 %
HCT: 39.7 % (ref 39.0–52.0)
Hemoglobin: 13.9 g/dL (ref 13.0–17.0)
Immature Granulocytes: 0 %
Lymphocytes Relative: 29 %
Lymphs Abs: 0.9 10*3/uL (ref 0.7–4.0)
MCH: 30.2 pg (ref 26.0–34.0)
MCHC: 35 g/dL (ref 30.0–36.0)
MCV: 86.3 fL (ref 80.0–100.0)
Monocytes Absolute: 0.7 10*3/uL (ref 0.1–1.0)
Monocytes Relative: 24 %
Neutro Abs: 1.3 10*3/uL — ABNORMAL LOW (ref 1.7–7.7)
Neutrophils Relative %: 46 %
Platelets: 122 10*3/uL — ABNORMAL LOW (ref 150–400)
RBC: 4.6 MIL/uL (ref 4.22–5.81)
RDW: 13.2 % (ref 11.5–15.5)
WBC: 2.9 10*3/uL — ABNORMAL LOW (ref 4.0–10.5)
nRBC: 0 % (ref 0.0–0.2)

## 2022-02-08 LAB — CMP (CANCER CENTER ONLY)
ALT: 28 U/L (ref 0–44)
AST: 29 U/L (ref 15–41)
Albumin: 4.1 g/dL (ref 3.5–5.0)
Alkaline Phosphatase: 70 U/L (ref 38–126)
Anion gap: 4 — ABNORMAL LOW (ref 5–15)
BUN: 19 mg/dL (ref 8–23)
CO2: 30 mmol/L (ref 22–32)
Calcium: 8.9 mg/dL (ref 8.9–10.3)
Chloride: 105 mmol/L (ref 98–111)
Creatinine: 1.17 mg/dL (ref 0.61–1.24)
GFR, Estimated: >60 mL/min (ref >60)
Glucose, Bld: 88 mg/dL (ref 70–99)
Potassium: 4.4 mmol/L (ref 3.5–5.1)
Sodium: 139 mmol/L (ref 135–145)
Total Bilirubin: 0.4 mg/dL (ref 0.3–1.2)
Total Protein: 6 g/dL — ABNORMAL LOW (ref 6.5–8.1)

## 2022-02-08 LAB — LACTATE DEHYDROGENASE: LDH: 157 U/L (ref 98–192)

## 2022-02-08 MED ORDER — IOHEXOL 300 MG/ML  SOLN
100.0000 mL | Freq: Once | INTRAMUSCULAR | Status: AC | PRN
  Administered 2022-02-08: 100 mL via INTRAVENOUS

## 2022-02-11 DIAGNOSIS — D849 Immunodeficiency, unspecified: Secondary | ICD-10-CM | POA: Diagnosis not present

## 2022-02-11 DIAGNOSIS — J069 Acute upper respiratory infection, unspecified: Secondary | ICD-10-CM | POA: Diagnosis not present

## 2022-02-11 DIAGNOSIS — D703 Neutropenia due to infection: Secondary | ICD-10-CM | POA: Diagnosis not present

## 2022-02-11 DIAGNOSIS — D696 Thrombocytopenia, unspecified: Secondary | ICD-10-CM | POA: Diagnosis not present

## 2022-02-11 DIAGNOSIS — R5383 Other fatigue: Secondary | ICD-10-CM | POA: Diagnosis not present

## 2022-02-20 ENCOUNTER — Inpatient Hospital Stay: Payer: No Typology Code available for payment source

## 2022-02-20 ENCOUNTER — Inpatient Hospital Stay: Payer: No Typology Code available for payment source | Attending: Hematology | Admitting: Hematology

## 2022-02-20 ENCOUNTER — Other Ambulatory Visit: Payer: Self-pay

## 2022-02-20 VITALS — BP 123/75 | HR 72 | Temp 97.3°F | Resp 18 | Wt 220.2 lb

## 2022-02-20 DIAGNOSIS — Z298 Encounter for other specified prophylactic measures: Secondary | ICD-10-CM

## 2022-02-20 DIAGNOSIS — Z8616 Personal history of COVID-19: Secondary | ICD-10-CM | POA: Diagnosis not present

## 2022-02-20 DIAGNOSIS — C8298 Follicular lymphoma, unspecified, lymph nodes of multiple sites: Secondary | ICD-10-CM

## 2022-02-20 DIAGNOSIS — Z87891 Personal history of nicotine dependence: Secondary | ICD-10-CM | POA: Insufficient documentation

## 2022-02-20 DIAGNOSIS — Z79899 Other long term (current) drug therapy: Secondary | ICD-10-CM | POA: Insufficient documentation

## 2022-02-20 LAB — CMP (CANCER CENTER ONLY)
ALT: 26 U/L (ref 0–44)
AST: 27 U/L (ref 15–41)
Albumin: 3.9 g/dL (ref 3.5–5.0)
Alkaline Phosphatase: 86 U/L (ref 38–126)
Anion gap: 7 (ref 5–15)
BUN: 20 mg/dL (ref 8–23)
CO2: 26 mmol/L (ref 22–32)
Calcium: 9 mg/dL (ref 8.9–10.3)
Chloride: 107 mmol/L (ref 98–111)
Creatinine: 1.1 mg/dL (ref 0.61–1.24)
GFR, Estimated: >60 mL/min (ref >60)
Glucose, Bld: 87 mg/dL (ref 70–99)
Potassium: 4.1 mmol/L (ref 3.5–5.1)
Sodium: 140 mmol/L (ref 135–145)
Total Bilirubin: 0.7 mg/dL (ref 0.3–1.2)
Total Protein: 6.1 g/dL — ABNORMAL LOW (ref 6.5–8.1)

## 2022-02-20 LAB — CBC WITH DIFFERENTIAL/PLATELET
Abs Immature Granulocytes: 0.01 10*3/uL (ref 0.00–0.07)
Basophils Absolute: 0 10*3/uL (ref 0.0–0.1)
Basophils Relative: 0 %
Eosinophils Absolute: 0 10*3/uL (ref 0.0–0.5)
Eosinophils Relative: 1 %
HCT: 40 % (ref 39.0–52.0)
Hemoglobin: 13.7 g/dL (ref 13.0–17.0)
Immature Granulocytes: 0 %
Lymphocytes Relative: 20 %
Lymphs Abs: 0.7 10*3/uL (ref 0.7–4.0)
MCH: 29.4 pg (ref 26.0–34.0)
MCHC: 34.3 g/dL (ref 30.0–36.0)
MCV: 85.8 fL (ref 80.0–100.0)
Monocytes Absolute: 0.7 10*3/uL (ref 0.1–1.0)
Monocytes Relative: 21 %
Neutro Abs: 1.9 10*3/uL (ref 1.7–7.7)
Neutrophils Relative %: 58 %
Platelets: 108 10*3/uL — ABNORMAL LOW (ref 150–400)
RBC: 4.66 MIL/uL (ref 4.22–5.81)
RDW: 13.1 % (ref 11.5–15.5)
WBC: 3.3 10*3/uL — ABNORMAL LOW (ref 4.0–10.5)
nRBC: 0 % (ref 0.0–0.2)

## 2022-02-20 LAB — LACTATE DEHYDROGENASE: LDH: 162 U/L (ref 98–192)

## 2022-02-22 ENCOUNTER — Telehealth: Payer: Self-pay | Admitting: Hematology

## 2022-02-22 NOTE — Telephone Encounter (Signed)
Scheduled follow-up appointment per 3/1 los. Patient is aware. ?

## 2022-02-25 DIAGNOSIS — R059 Cough, unspecified: Secondary | ICD-10-CM | POA: Diagnosis not present

## 2022-02-25 DIAGNOSIS — R110 Nausea: Secondary | ICD-10-CM | POA: Diagnosis not present

## 2022-02-27 ENCOUNTER — Encounter: Payer: Self-pay | Admitting: Hematology

## 2022-02-27 NOTE — Progress Notes (Signed)
[place]   [phone] [phone]   Hematology oncology clinic note  Patient Care Team: Lawerance Cruel, MD as PCP - General (Family Medicine)  Date of Service:  .02/20/2022   CHIEF COMPLAINTS/PURPOSE OF CONSULTATION:  Patient is here for follow-up of follicular lymphoma  and last planned cycle of maintenance Rituxan  Oncology History  DLBCL (diffuse large B cell lymphoma) ([place])  06/21/2015 Imaging   CT Chest/abd/pelvis: IMPRESSION: 5 mm nodule within the right lower lobe as described.   Pneumobilia consistent with a prior sphincterotomy.   No acute abnormality is identified. No findings to suggest chest etiology for the known lesion at C5 are seen.   06/21/2015 Imaging   MRI c spine1. Diffuse marrow replacement of the C5 vertebral body highly concerning for neoplasm (metastatic disease, myeloma, or lymphoma). Epidural tumor at this level results in moderate spinal stenosis with moderate impression on the spinal cord    07/10/2015 PET scan   1. The previously demonstrated abnormal C5 vertebral body is hypermetabolic. No other osseous lesions demonstrated. 2. There are small hypermetabolic lymph nodes within the left mesentery and both inguinal regions.    07/26/2015 Initial Biopsy   Had a C5 corpectomy pathology showed diffuse large B-cell lymphoma   08/08/2015 -  Chemotherapy   Started for cycle of R CHOP.    08/09/2015 -  Chemotherapy   Received first cycle of intrathecal methotrexate plus hydrocortisone for CNS prophylaxis.   02/05/2019 -  Chemotherapy   Patient is on Treatment Plan : NON-HODGKINS LYMPHOMA Rituximab q month        HISTORY OF PRESENTING ILLNESS:  Ethan Stewart is a 76 y.o. male who is here for follow-up of follicular lymphoma and last planned cycle of maintenance Rituxan. He had a recent COVID-19 infection a few weeks ago and is still having some respiratory infection and significant fatigue.  After discussing pros and  cons we decided to hold off on his last dose of maintenance Rituxan. Labs done today were reviewed with the patient in detail. He is accompanied for this visit by his wife. CT chest abdomen pelvis from 02/10/2022 showed no evidence of lymphoma recurrence.   REVIEW OF SYSTEMS:   10 Point review of Systems was done is negative except as noted above.   MEDICAL HISTORY:  Past Medical History:  Diagnosis Date   Anxiety    ARDS (adult respiratory distress syndrome) ([place])    2016 August at [facility]   Arm numbness 08/07/15   Limited movement due to tumor on spine   B-cell lymphoma ([place])    Bone cancer ([place])     tumor on C5   Cervical spine tumor 07/03/2015   Diffuse large B cell lymphoma ([place])    biposy 07/25/2105   GERD (gastroesophageal reflux disease)    H. pylori infection    History of bleeding ulcers 1990's   History of blood transfusion    Hypothyroidism     SURGICAL HISTORY: Past Surgical History:  Procedure Laterality Date   ANTERIOR CERVICAL CORPECTOMY N/A 07/26/2015   Procedure: Cervical five Corpectomy/Cervical four-six Fusion/Plate ;  Surgeon: Eustace Moore, MD;  Location: [facility];  Service: Neurosurgery;  Laterality: N/A;  C5 Corpectomy/C4-6 Fusion/Plate C4-6   COLONOSCOPY     EYE SURGERY Right    cataracts   HERNIA REPAIR Bilateral    with mesh   IR GENERIC HISTORICAL  02/03/2017   IR REMOVAL TUN ACCESS W/ PORT W/O FL MOD SED 02/03/2017  Markus Daft, MD [facility]   IR REMOVAL TUN ACCESS W/ PORT W/O FL MOD SED  06/28/2020   LYMPH NODE BIOPSY Right 12/11/2018   Procedure: EXCISIONAL BIOPSY OF DEEP RIGHT INGUINAL LYMPH NODE ERAS PATHWAY;  Surgeon: Fanny Skates, MD;  Location: [facility];  Service: General;  Laterality: Right;    SOCIAL HISTORY: Social History   Socioeconomic History   Marital status: Married    Spouse name: Not on file   Number of children: Not on file   Years of education: Not on file   Highest education level: Not on file  Occupational History    Not on file  Tobacco Use   Smoking status: Former    Packs/day: 1.00    Years: 10.00    Pack years: 10.00    Types: Cigarettes    Quit date: 12/23/1981    Years since quitting: 40.2   Smokeless tobacco: Never  Vaping Use   Vaping Use: Never used  Substance and Sexual Activity   Alcohol use: Yes    Alcohol/week: 5.0 standard drinks    Types: 2 Glasses of wine, 3 Cans of beer per week   Drug use: No   Sexual activity: Yes  Other Topics Concern   Not on file  Social History Narrative   Not on file   Social Determinants of Health   Financial Resource Strain: Not on file  Food Insecurity: Not on file  Transportation Needs: Not on file  Physical Activity: Not on file  Stress: Not on file  Social Connections: Not on file  Intimate Partner Violence: Not on file    FAMILY HISTORY: Family History  Problem Relation Age of Onset   Hypertension Mother     ALLERGIES:  is allergic to pegfilgrastim.  MEDICATIONS:  Current Outpatient Medications  Medication Sig Dispense Refill   cholecalciferol (VITAMIN D3) 25 MCG (1000 UNIT) tablet 1 tablet     diazepam (VALIUM) 2 MG tablet Take 2 mg by mouth daily as needed for anxiety.     ergocalciferol (VITAMIN D2) 1.25 MG (50000 UT) capsule Take 1 capsule (50,000 Units total) by mouth once a week. 12 capsule 4   levothyroxine (SYNTHROID, LEVOTHROID) 88 MCG tablet Take 88 mcg by mouth daily before breakfast.   2   Multiple Vitamins-Minerals (CENTRUM SILVER PO) Take by mouth daily.     polyethylene glycol powder (GLYCOLAX/MIRALAX) 17 GM/SCOOP powder TAKE AS DIRECTED BY MOUTH     rivaroxaban (XARELTO) 10 MG TABS tablet Take 1 tablet by mouth daily. (Patient not taking: Reported on 12/25/2021)     simvastatin (ZOCOR) 20 MG tablet Take 20 mg by mouth daily.     simvastatin (ZOCOR) 40 MG tablet Take 0.5 tablets by mouth at bedtime. (Patient not taking: Reported on 12/25/2021)     tamsulosin (FLOMAX) 0.4 MG CAPS capsule Take 1 capsule (0.4 mg total) by  mouth at bedtime. 30 capsule 3   temazepam (RESTORIL) 15 MG capsule Take 15 mg by mouth at bedtime as needed for sleep.      No current facility-administered medications for this visit.    PHYSICAL EXAMINATION: ECOG PERFORMANCE STATUS: 0 .BP 123/75    Pulse 72    Temp (!) 97.3 F (36.3 C)    Resp 18    Wt 220 lb 3.2 oz (99.9 kg)    SpO2 100%    BMI 29.05 kg/m  NAD GENERAL:alert, in no acute distress and comfortable SKIN: no acute rashes, no significant lesions EYES: conjunctiva are  pink and non-injected, sclera anicteric OROPHARYNX: MMM, no exudates, no oropharyngeal erythema or ulceration NECK: supple, no JVD LYMPH:  no palpable lymphadenopathy in the cervical, axillary or inguinal regions LUNGS: clear to auscultation b/l with normal respiratory effort HEART: regular rate & rhythm ABDOMEN:  normoactive bowel sounds , non tender, not distended. Extremity: no pedal edema PSYCH: alert & oriented x 3 with fluent speech NEURO: no focal motor/sensory deficits  LABORATORY DATA:   I have reviewed the data as listed CBC Latest Ref Rng & Units 02/20/2022 02/08/2022 12/25/2021  WBC 4.0 - 10.5 K/uL 3.3(L) 2.9(L) 5.2  Hemoglobin 13.0 - 17.0 g/dL 13.7 13.9 14.5  Hematocrit 39.0 - 52.0 % 40.0 39.7 42.3  Platelets 150 - 400 K/uL 108(L) 122(L) 164    CMP Latest Ref Rng & Units 02/20/2022 02/08/2022 12/25/2021  Glucose 70 - 99 mg/dL 87 88 107(H)  BUN 8 - 23 mg/dL '20 19 23  '$ Creatinine 0.61 - 1.24 mg/dL 1.10 1.17 1.09  Sodium 135 - 145 mmol/L 140 139 140  Potassium 3.5 - 5.1 mmol/L 4.1 4.4 4.2  Chloride 98 - 111 mmol/L 107 105 107  CO2 22 - 32 mmol/L '26 30 26  '$ Calcium 8.9 - 10.3 mg/dL 9.0 8.9 9.3  Total Protein 6.5 - 8.1 g/dL 6.1(L) 6.0(L) 6.4(L)  Total Bilirubin 0.3 - 1.2 mg/dL 0.7 0.4 0.5  Alkaline Phos 38 - 126 U/L 86 70 76  AST 15 - 41 U/L '27 29 23  '$ ALT 0 - 44 U/L '26 28 24    '$ Lab Results  Component Value Date   LDH 162 02/20/2022    RADIOGRAPHIC STUDIES: I have personally reviewed the  radiological images as listed and agreed with the findings in the report. CT CHEST ABDOMEN PELVIS W CONTRAST  Result Date: 02/10/2022 CLINICAL DATA:  History of follicular lymphoma. Evaluation for recurrence/progression. EXAM: CT CHEST, ABDOMEN, AND PELVIS WITH CONTRAST TECHNIQUE: Multidetector CT imaging of the chest, abdomen and pelvis was performed following the standard protocol during bolus administration of intravenous contrast. RADIATION DOSE REDUCTION: This exam was performed according to the departmental dose-optimization program which includes automated exposure control, adjustment of the mA and/or kV according to patient size and/or use of iterative reconstruction technique. CONTRAST:  176m OMNIPAQUE IOHEXOL 300 MG/ML  SOLN COMPARISON:  Multiple priors including most recent prior June 27, 2021 FINDINGS: CT CHEST FINDINGS Cardiovascular: Aortic atherosclerosis without aneurysmal dilation. No central pulmonary embolus on this nondedicated study. Mild cardiomegaly. No significant pericardial effusion/thickening. Mediastinum/Nodes: No supraclavicular adenopathy. No discrete thyroid nodule. No pathologically enlarged mediastinal, hilar or axillary lymph nodes. The trachea and esophagus are grossly unremarkable. Lungs/Pleura: Band of nodular linear thickening at the right lung base appears unchanged over multiple priors and favored benign. Small peripheral nodules in the right lower lobe measuring up to 5 mm on image 103/6 are stable over multiple priors and favored benign. No suspicious pulmonary nodules or masses. Musculoskeletal: Thoracic spondylosis. Cervical fusion hardware. No aggressive lytic or blastic lesion of bone. CT ABDOMEN PELVIS FINDINGS Hepatobiliary: No suspicious hepatic lesion. Gallbladder is unremarkable. No biliary ductal dilation. Trace pneumobilia stable over multiple priors. Pancreas: No pancreatic ductal dilation or evidence of acute inflammation. Spleen: No splenomegaly or focal  splenic lesion. Adrenals/Urinary Tract: Bilateral adrenal glands appear normal. No hydronephrosis. Left cortical renal scarring. Left renal sinus cysts. Kidneys demonstrate symmetric enhancement excretion of contrast material. Urinary bladder is unremarkable for degree of distension. Stomach/Bowel: Radiopaque enteric contrast material traverses the splenic flexure. Stomach is nondistended limiting evaluation.  No pathologic dilation of small or large bowel. Terminal ileum appears normal. Appendix appears normal. No evidence of acute bowel inflammation. Vascular/Lymphatic: Aortic atherosclerosis. Retroaortic left renal vein. No gastrohepatic or hepatoduodenal ligament lymphadenopathy. No retroperitoneal or mesenteric lymphadenopathy. No pelvic sidewall lymphadenopathy. No groin lymphadenopathy. Reproductive: Prostate is unremarkable. Other: No significant abdominopelvic free fluid. Bilateral inguinal herniorrhaphy with mesh. Musculoskeletal: Multilevel degenerative changes spine. No aggressive lytic or blastic lesion of bone. IMPRESSION: 1. No evidence of lymphoma recurrence within the chest, abdomen, or pelvis. 2. Stable linear nodular thickening in the right lung base and right-sided pulmonary nodules, favored benign. 3.  Aortic Atherosclerosis (ICD10-I70.0). Electronically Signed   By: Dahlia Bailiff M.D.   On: 02/10/2022 14:32    ASSESSMENT & PLAN:   NALIN MAZZOCCO is a 76 y.o.  male with a history of :  1 Follicular lymphoma -Previously treated with Revlimid and Rituxan, currently on Rituxan maintenance q2 months . CT chest abdomen pelvis on 06/27/2021 which showed no evidence of lymphoma recurrence on his CT chest abdomen pelvis. PLAN -Labs from today reviewed with the patient. -Recent CT chest abdomen pelvis were reviewed with the patient and shows no evidence of lymphoma. -Given patient's recent COVID-19 infection and upper respiratory infection will hold off on his last dose of maintenance Rituxan  and go to surveillance mode. -Patient has no lab, clinical or radiographic findings suggestive of recurrent/progressive lymphoma at this time  2.   history of diffuse large B-cell lymphoma currently in remission Status post treatment with modified dose reduced R-CHOP  3.  History of Neulasta related ARDS  Follow-up note: Return to clinic with Dr. Irene Limbo with labs in 4 months. Holding Rituxan treatment today due to recent COVID infection. (Patient has completed his maintenance Rituxan    All questions were answered. The patient knows to call the clinic with any problems, questions or concerns.  Sullivan Lone MD MS Hematology/Oncology Physician [facility].

## 2022-02-28 ENCOUNTER — Emergency Department (HOSPITAL_BASED_OUTPATIENT_CLINIC_OR_DEPARTMENT_OTHER): Payer: No Typology Code available for payment source

## 2022-02-28 ENCOUNTER — Emergency Department (HOSPITAL_BASED_OUTPATIENT_CLINIC_OR_DEPARTMENT_OTHER): Payer: No Typology Code available for payment source | Admitting: Radiology

## 2022-02-28 ENCOUNTER — Emergency Department (HOSPITAL_BASED_OUTPATIENT_CLINIC_OR_DEPARTMENT_OTHER)
Admission: EM | Admit: 2022-02-28 | Discharge: 2022-02-28 | Disposition: A | Discharge status: Home / Self Care | Payer: No Typology Code available for payment source | Attending: Emergency Medicine | Admitting: Emergency Medicine

## 2022-02-28 ENCOUNTER — Encounter (HOSPITAL_BASED_OUTPATIENT_CLINIC_OR_DEPARTMENT_OTHER): Payer: Self-pay

## 2022-02-28 ENCOUNTER — Other Ambulatory Visit: Payer: Self-pay

## 2022-02-28 DIAGNOSIS — J181 Lobar pneumonia, unspecified organism: Secondary | ICD-10-CM | POA: Diagnosis not present

## 2022-02-28 DIAGNOSIS — R630 Anorexia: Secondary | ICD-10-CM | POA: Diagnosis not present

## 2022-02-28 DIAGNOSIS — R0602 Shortness of breath: Secondary | ICD-10-CM | POA: Diagnosis not present

## 2022-02-28 DIAGNOSIS — R509 Fever, unspecified: Secondary | ICD-10-CM | POA: Diagnosis present

## 2022-02-28 DIAGNOSIS — R5383 Other fatigue: Secondary | ICD-10-CM | POA: Insufficient documentation

## 2022-02-28 DIAGNOSIS — R059 Cough, unspecified: Secondary | ICD-10-CM | POA: Diagnosis not present

## 2022-02-28 DIAGNOSIS — J189 Pneumonia, unspecified organism: Secondary | ICD-10-CM

## 2022-02-28 DIAGNOSIS — I517 Cardiomegaly: Secondary | ICD-10-CM | POA: Diagnosis not present

## 2022-02-28 LAB — CBC WITH DIFFERENTIAL/PLATELET
Abs Immature Granulocytes: 0 10*3/uL (ref 0.00–0.07)
Basophils Absolute: 0 10*3/uL (ref 0.0–0.1)
Basophils Relative: 0 %
Eosinophils Absolute: 0 10*3/uL (ref 0.0–0.5)
Eosinophils Relative: 0 %
HCT: 41.3 % (ref 39.0–52.0)
Hemoglobin: 14 g/dL (ref 13.0–17.0)
Immature Granulocytes: 0 %
Lymphocytes Relative: 21 %
Lymphs Abs: 0.8 10*3/uL (ref 0.7–4.0)
MCH: 29.3 pg (ref 26.0–34.0)
MCHC: 33.9 g/dL (ref 30.0–36.0)
MCV: 86.4 fL (ref 80.0–100.0)
Monocytes Absolute: 0.5 10*3/uL (ref 0.1–1.0)
Monocytes Relative: 12 %
Neutro Abs: 2.5 10*3/uL (ref 1.7–7.7)
Neutrophils Relative %: 67 %
Platelets: 143 10*3/uL — ABNORMAL LOW (ref 150–400)
RBC: 4.78 MIL/uL (ref 4.22–5.81)
RDW: 13.1 % (ref 11.5–15.5)
WBC: 3.8 10*3/uL — ABNORMAL LOW (ref 4.0–10.5)
nRBC: 0 % (ref 0.0–0.2)

## 2022-02-28 LAB — URINALYSIS, ROUTINE W REFLEX MICROSCOPIC
Bilirubin Urine: NEGATIVE
Glucose, UA: NEGATIVE mg/dL
Hgb urine dipstick: NEGATIVE
Ketones, ur: NEGATIVE mg/dL
Leukocytes,Ua: NEGATIVE
Nitrite: NEGATIVE
Protein, ur: 30 mg/dL — AB
Specific Gravity, Urine: 1.028 (ref 1.005–1.030)
pH: 6.5 (ref 5.0–8.0)

## 2022-02-28 LAB — TROPONIN I (HIGH SENSITIVITY): Troponin I (High Sensitivity): 7 ng/L (ref <18)

## 2022-02-28 LAB — COMPREHENSIVE METABOLIC PANEL
ALT: 37 U/L (ref 0–44)
AST: 50 U/L — ABNORMAL HIGH (ref 15–41)
Albumin: 4 g/dL (ref 3.5–5.0)
Alkaline Phosphatase: 62 U/L (ref 38–126)
Anion gap: 10 (ref 5–15)
BUN: 17 mg/dL (ref 8–23)
CO2: 27 mmol/L (ref 22–32)
Calcium: 8.8 mg/dL — ABNORMAL LOW (ref 8.9–10.3)
Chloride: 100 mmol/L (ref 98–111)
Creatinine, Ser: 1.24 mg/dL (ref 0.61–1.24)
GFR, Estimated: >60 mL/min (ref >60)
Glucose, Bld: 134 mg/dL — ABNORMAL HIGH (ref 70–99)
Potassium: 4 mmol/L (ref 3.5–5.1)
Sodium: 137 mmol/L (ref 135–145)
Total Bilirubin: 0.6 mg/dL (ref 0.3–1.2)
Total Protein: 6.6 g/dL (ref 6.5–8.1)

## 2022-02-28 LAB — MAGNESIUM: Magnesium: 2 mg/dL (ref 1.7–2.4)

## 2022-02-28 LAB — LIPASE, BLOOD: Lipase: 25 U/L (ref 11–51)

## 2022-02-28 MED ORDER — SENNOSIDES-DOCUSATE SODIUM 8.6-50 MG PO TABS: 1.0000 | ORAL_TABLET | Freq: Every day | ORAL | 0 refills | Status: DC | PRN

## 2022-02-28 MED ORDER — SODIUM CHLORIDE 0.9 % IV BOLUS
1000.0000 mL | Freq: Once | INTRAVENOUS | Status: AC
  Administered 2022-02-28: 15:00:00 1000 mL via INTRAVENOUS

## 2022-02-28 MED ORDER — ONDANSETRON HCL 4 MG/2ML IJ SOLN
4.0000 mg | Freq: Once | INTRAMUSCULAR | Status: AC
  Administered 2022-02-28: 15:00:00 4 mg via INTRAVENOUS
  Filled 2022-02-28: qty 2

## 2022-02-28 MED ORDER — IOHEXOL 350 MG/ML SOLN
100.0000 mL | Freq: Once | INTRAVENOUS | Status: AC | PRN
  Administered 2022-02-28: 16:00:00 100 mL via INTRAVENOUS

## 2022-02-28 MED ORDER — LEVOFLOXACIN 750 MG PO TABS: 750.0000 mg | ORAL_TABLET | Freq: Every day | ORAL | 0 refills | Status: AC

## 2022-02-28 MED ORDER — LEVOFLOXACIN IN D5W 750 MG/150ML IV SOLN
750.0000 mg | Freq: Once | INTRAVENOUS | Status: AC
  Administered 2022-02-28: 17:00:00 750 mg via INTRAVENOUS
  Filled 2022-02-28: qty 150

## 2022-02-28 MED ORDER — ACETAMINOPHEN 500 MG PO TABS
1000.0000 mg | ORAL_TABLET | Freq: Once | ORAL | Status: AC
  Administered 2022-02-28: 15:00:00 1000 mg via ORAL
  Filled 2022-02-28: qty 2

## 2022-02-28 NOTE — ED Triage Notes (Signed)
Patient here POV from Home with Fever. ? ?Patient diagnosed with COVID-19 5 weeks ago and has had residual symptoms since. ? ?Symptoms include Dry Cough, Fevers, Moderate Nausea, Chills, Aches, Lack of Appetite. ? ?Temperature of 100 at Home last PM. No Emesis. No Diarrhea. ? ?No Active Chemotherapy.  ? ?NAD Noted during Triage. A&Ox4. GCS 15. Ambulatory.  ?

## 2022-02-28 NOTE — Discharge Instructions (Addendum)
Your CT scan today showed a pneumonia in your right lung.  We started you on treatment with an antibiotic called Levaquin which has broad coverage for many types of bacteria that can cause pneumonia.  Your next dose of Levaquin is tomorrow 03/01/22 with dinner.  Please complete the full course prescribed for 5 days.   ? ?You should STOP taking azithromycin and any other antibiotic prescribed to you in the past. ? ?Schedule a follow up appointment with your doctor's office early next week to see how you are doing. ? ?We talked about the possibility of "long covid" causing some stomach upset and loss of appetite.  This is difficult to diagnose and manage, and the condition is still being studied.  You can talk to your doctor about this possibility as well. ? ? ?

## 2022-02-28 NOTE — ED Provider Notes (Signed)
[place] EMERGENCY DEPT Provider Note   CSN: [id number] Arrival date & time: 02/28/22  1419     History  Chief Complaint  Patient presents with   Fever    Ethan Stewart is a 76 y.o. male.  76 yo M cough fatigue, fever.  Going on for about 5 weeks but worse the past two.  Diagnosed with covid at the onset and hasnt gotten better. Started on z pack a few days ago. Not really eating and drinking.  Denies abdominal pain.     Fever     Home Medications Prior to Admission medications   Medication Sig Start Date End Date Taking? Authorizing Provider  azithromycin (ZITHROMAX) 500 MG tablet Take 500 mg by mouth daily. 02/25/22  Yes [provider]  cholecalciferol (VITAMIN D3) 25 MCG (1000 UNIT) tablet 1 tablet   Yes [provider]  diazepam (VALIUM) 2 MG tablet Take 2 mg by mouth daily as needed for anxiety.   Yes [provider]  ergocalciferol (VITAMIN D2) 1.25 MG (50000 UT) capsule Take 1 capsule (50,000 Units total) by mouth once a week. 01/17/20  Yes Brunetta Genera, MD  levofloxacin (LEVAQUIN) 750 MG tablet Take 1 tablet (750 mg total) by mouth daily for 5 days. 03/01/22 03/06/22 Yes Trifan, Carola Rhine, MD  levothyroxine (SYNTHROID, LEVOTHROID) 88 MCG tablet Take 88 mcg by mouth daily before breakfast.  08/16/18  Yes [provider]  Multiple Vitamins-Minerals (CENTRUM SILVER PO) Take by mouth daily.   Yes [provider]  ondansetron (ZOFRAN) 4 MG tablet Take 4-8 mg by mouth 3 (three) times daily as needed. 02/25/22  Yes [provider]  polyethylene glycol powder (GLYCOLAX/MIRALAX) 17 GM/SCOOP powder TAKE AS DIRECTED BY MOUTH 05/04/20  Yes [provider]  senna-docusate (SENOKOT-S) 8.6-50 MG tablet Take 1 tablet by mouth daily as needed for up to 15 doses for mild constipation. 02/28/22  Yes Wyvonnia Dusky, MD  simvastatin (ZOCOR) 20 MG tablet Take 20 mg by mouth daily.   Yes [provider]   tamsulosin (FLOMAX) 0.4 MG CAPS capsule Take 1 capsule (0.4 mg total) by mouth at bedtime. 01/17/20  Yes Brunetta Genera, MD  temazepam (RESTORIL) 15 MG capsule Take 15 mg by mouth at bedtime as needed for sleep.    Yes [provider]      Allergies    Pegfilgrastim    Review of Systems   Review of Systems  Constitutional:  Positive for fever.   Physical Exam Updated Vital Signs BP 116/68 (BP Location: Right Arm)    Pulse 65    Temp 99.1 F (37.3 C) (Oral)    Resp 16    Ht '6\' 1"'$  (1.854 m)    Wt 99.9 kg    SpO2 97%    BMI 29.06 kg/m  Physical Exam Vitals and nursing note reviewed.  Constitutional:      Appearance: He is well-developed.  HENT:     Head: Normocephalic and atraumatic.  Eyes:     Pupils: Pupils are equal, round, and reactive to light.  Neck:     Vascular: No JVD.  Cardiovascular:     Rate and Rhythm: Normal rate and regular rhythm.     Heart sounds: No murmur heard.   No friction rub. No gallop.  Pulmonary:     Effort: No respiratory distress.     Breath sounds: No wheezing.  Abdominal:     General: There is no distension.  Tenderness: There is no abdominal tenderness. There is no guarding or rebound.  Musculoskeletal:        General: Normal range of motion.     Cervical back: Normal range of motion and neck supple.  Skin:    Coloration: Skin is not pale.     Findings: No rash.  Neurological:     Mental Status: He is alert and oriented to person, place, and time.  Psychiatric:        Behavior: Behavior normal.    ED Results / Procedures / Treatments   Labs (all labs ordered are listed, but only abnormal results are displayed) Labs Reviewed  CBC WITH DIFFERENTIAL/PLATELET - Abnormal; Notable for the following components:      Result Value   WBC 3.8 (*)    Platelets 143 (*)    All other components within normal limits  COMPREHENSIVE METABOLIC PANEL - Abnormal; Notable for the following components:   Glucose, Bld 134 (*)     Calcium 8.8 (*)    AST 50 (*)    All other components within normal limits  URINALYSIS, ROUTINE W REFLEX MICROSCOPIC - Abnormal; Notable for the following components:   Protein, ur 30 (*)    All other components within normal limits  MAGNESIUM  LIPASE, BLOOD  TROPONIN I (HIGH SENSITIVITY)    EKG EKG Interpretation  Date/Time:  Thursday February 28 2022 14:37:24 EST Ventricular Rate:  88 PR Interval:  171 QRS Duration: 163 QT Interval:  379 QTC Calculation: 459 R Axis:   -39 Text Interpretation: Sinus rhythm Right bundle branch block Baseline wander in lead(s) I III aVL rbb new since 2017 Otherwise no significant change Confirmed by Deno Etienne [phone]) on 02/28/2022 2:48:16 PM  Radiology DG Chest 2 View  Result Date: 02/28/2022 CLINICAL DATA:  Shortness of breath EXAM: CHEST - 2 VIEW COMPARISON:  Chest radiograph done on 12/10/2018 CT done on 02/08/2022 FINDINGS: Cardiac size is within normal limits. There are no signs of pulmonary edema. New linear infiltrate is seen in the right parahilar region. Increased interstitial markings are seen in the left lower lung fields. There is no pleural effusion or pneumothorax. IMPRESSION: There is interval appearance of linear patchy infiltrate in the right parahilar region and patchy infiltrates in the left lower lung fields consistent with multifocal pneumonia. There is no pleural effusion. Electronically Signed   By: Elmer Picker M.D.   On: 02/28/2022 15:10   CT Angio Chest PE W and/or Wo Contrast  Result Date: 02/28/2022 CLINICAL DATA:  Worsening cough and fatigue.  History of lymphoma. EXAM: CT ANGIOGRAPHY CHEST WITH CONTRAST TECHNIQUE: Multidetector CT imaging of the chest was performed using the standard protocol during bolus administration of intravenous contrast. Multiplanar CT image reconstructions and MIPs were obtained to evaluate the vascular anatomy. RADIATION DOSE REDUCTION: This exam was performed according to the departmental  dose-optimization program which includes automated exposure control, adjustment of the mA and/or kV according to patient size and/or use of iterative reconstruction technique. CONTRAST:  186m OMNIPAQUE IOHEXOL 350 MG/ML SOLN COMPARISON:  CT chest dated February 08, 2022. FINDINGS: Cardiovascular: Satisfactory opacification of the pulmonary arteries to the segmental level. No evidence of pulmonary embolism. Unchanged cardiomegaly. No pericardial effusion. No thoracic aortic aneurysm. Coronary, aortic arch, and branch vessel atherosclerotic vascular disease. Mediastinum/Nodes: No enlarged mediastinal, hilar, or axillary lymph nodes. Thyroid gland, trachea, and esophagus demonstrate no significant findings. Lungs/Pleura: New confluent ground-glass density with septal thickening in the right upper lobe and lingula. Mild ground-glass  density in the superior segments of both lower lobes. Unchanged mild subsegmental atelectasis/scarring in both lower lobes. Few scattered small nodules in the right lung measuring up to 5 mm are unchanged over multiple priors and are considered benign. No pleural effusion or pneumothorax. Upper Abdomen: No acute abnormality.  Unchanged pneumobilia. Musculoskeletal: No chest wall abnormality. No acute or significant osseous findings. Review of the MIP images confirms the above findings. IMPRESSION: 1. No evidence of pulmonary embolism. 2. New confluent ground-glass density with septal thickening in the right upper lobe and lingula, concerning for multifocal pneumonia, including atypical infection. 3. Aortic Atherosclerosis (ICD10-I70.0). Electronically Signed   By: Titus Dubin M.D.   On: 02/28/2022 16:47    Procedures Procedures    Medications Ordered in ED Medications  sodium chloride 0.9 % bolus 1,000 mL ( Intravenous Stopped 02/28/22 1611)  ondansetron (ZOFRAN) injection 4 mg (4 mg Intravenous Given 02/28/22 1448)  acetaminophen (TYLENOL) tablet 1,000 mg (1,000 mg Oral Given  02/28/22 1507)  iohexol (OMNIPAQUE) 350 MG/ML injection 100 mL (100 mLs Intravenous Contrast Given 02/28/22 1629)  levofloxacin (LEVAQUIN) IVPB 750 mg (0 mg Intravenous Stopped 02/28/22 1838)    ED Course/ Medical Decision Making/ A&P                           Medical Decision Making Amount and/or Complexity of Data Reviewed Labs: ordered. Radiology: ordered. ECG/medicine tests: ordered.  Risk OTC drugs. Prescription drug management.   76 yo M with a cc of not feeling well.  Diagnosed with covid about 5 weeks ago and has had a progressive decline.  Hx of lymphoma.  My review of record seen by oncology last week, too unwell for chemo.    Will obtain lab eval, cxr, bolus of fluids antiemetics.   Oral trial.  Reassess.   Signed out to Dr. Langston Masker, please see his note for further details of care.   Medications given during this visit Medications  sodium chloride 0.9 % bolus 1,000 mL ( Intravenous Stopped 02/28/22 1611)  ondansetron (ZOFRAN) injection 4 mg (4 mg Intravenous Given 02/28/22 1448)  acetaminophen (TYLENOL) tablet 1,000 mg (1,000 mg Oral Given 02/28/22 1507)  iohexol (OMNIPAQUE) 350 MG/ML injection 100 mL (100 mLs Intravenous Contrast Given 02/28/22 1629)  levofloxacin (LEVAQUIN) IVPB 750 mg (0 mg Intravenous Stopped 02/28/22 1838)     The patient appears reasonably screen and/or stabilized for discharge and I doubt any other medical condition or other [facility] requiring further screening, evaluation, or treatment in the ED at this time prior to discharge.           Final Clinical Impression(s) / ED Diagnoses Final diagnoses:  Community acquired pneumonia of right lung, unspecified part of lung  Loss of appetite  Other fatigue    Rx / DC Orders ED Discharge Orders          Ordered    levofloxacin (LEVAQUIN) 750 MG tablet  Daily        02/28/22 1705    senna-docusate (SENOKOT-S) 8.6-50 MG tablet  Daily PRN        02/28/22 [place], [place], DO 03/01/22  [phone]

## 2022-02-28 NOTE — ED Provider Notes (Signed)
76 yo male here with 5 weeks of cough, congestion, fatigue ?Had covid approx 5 weeks ago ?Hx of lymphoma and ARDS in the past, not on active chemotherapy ?Pt on xarelto ? ?ECG without significant changes from prior tracing ?Pending labs, IV fluids, IV zofran, reassessment  ? ?I personally viewed and interpreted the patient's imaging and labs.  His blood tests are unremarkable, did not show signs of significant dehydration, no leukocytosis.  His vital signs on reassessment remained stable and normal, afebrile and nonhypoxic.  His CT PE study per my review does not show acute PE, but does demonstrate multifocal pneumonia on the right side.  Is unclear if this is residual from his viral illness, but given that he is symptomatic with cough and fatigue I do think is reasonable to treat with antibiotics.  With his immunosuppression history, I think Levaquin would be an appropriate choice for coverage of CAP and atypical organisms (CT imaging shows atypical pattern as well).  He has been treated with only 3 days of azithromycin, which I think would be an adequate therapy for pneumonia.  He also was not able to tolerate Augmentin due to significant GI upset.  Therefore I do not think he has failed outpatient management, it is reasonable to treat him with his oral antibiotics at home.  He and his wife verbalized agreement and understanding with the plan.  Curb65 score of 1. ? ? ?  ?Wyvonnia Dusky, MD ?02/28/22 1701 ? ?

## 2022-03-05 ENCOUNTER — Other Ambulatory Visit: Payer: Self-pay

## 2022-03-05 ENCOUNTER — Encounter (HOSPITAL_BASED_OUTPATIENT_CLINIC_OR_DEPARTMENT_OTHER): Payer: Self-pay | Admitting: Emergency Medicine

## 2022-03-05 ENCOUNTER — Emergency Department (HOSPITAL_BASED_OUTPATIENT_CLINIC_OR_DEPARTMENT_OTHER): Payer: No Typology Code available for payment source

## 2022-03-05 ENCOUNTER — Emergency Department (HOSPITAL_BASED_OUTPATIENT_CLINIC_OR_DEPARTMENT_OTHER)
Admission: EM | Admit: 2022-03-05 | Discharge: 2022-03-05 | Disposition: A | Discharge status: Home / Self Care | Payer: No Typology Code available for payment source | Attending: Emergency Medicine | Admitting: Emergency Medicine

## 2022-03-05 DIAGNOSIS — R5383 Other fatigue: Secondary | ICD-10-CM | POA: Diagnosis present

## 2022-03-05 DIAGNOSIS — Z8616 Personal history of COVID-19: Secondary | ICD-10-CM | POA: Diagnosis not present

## 2022-03-05 DIAGNOSIS — R5381 Other malaise: Secondary | ICD-10-CM | POA: Diagnosis not present

## 2022-03-05 DIAGNOSIS — E039 Hypothyroidism, unspecified: Secondary | ICD-10-CM | POA: Diagnosis not present

## 2022-03-05 DIAGNOSIS — Z8572 Personal history of non-Hodgkin lymphomas: Secondary | ICD-10-CM | POA: Diagnosis not present

## 2022-03-05 DIAGNOSIS — R531 Weakness: Secondary | ICD-10-CM | POA: Insufficient documentation

## 2022-03-05 DIAGNOSIS — Z79899 Other long term (current) drug therapy: Secondary | ICD-10-CM | POA: Insufficient documentation

## 2022-03-05 LAB — COMPREHENSIVE METABOLIC PANEL
ALT: 54 U/L — ABNORMAL HIGH (ref 0–44)
AST: 77 U/L — ABNORMAL HIGH (ref 15–41)
Albumin: 3.7 g/dL (ref 3.5–5.0)
Alkaline Phosphatase: 64 U/L (ref 38–126)
Anion gap: 13 (ref 5–15)
BUN: 21 mg/dL (ref 8–23)
CO2: 22 mmol/L (ref 22–32)
Calcium: 8.8 mg/dL — ABNORMAL LOW (ref 8.9–10.3)
Chloride: 101 mmol/L (ref 98–111)
Creatinine, Ser: 1.12 mg/dL (ref 0.61–1.24)
GFR, Estimated: >60 mL/min (ref >60)
Glucose, Bld: 98 mg/dL (ref 70–99)
Potassium: 4 mmol/L (ref 3.5–5.1)
Sodium: 136 mmol/L (ref 135–145)
Total Bilirubin: 0.8 mg/dL (ref 0.3–1.2)
Total Protein: 6.2 g/dL — ABNORMAL LOW (ref 6.5–8.1)

## 2022-03-05 LAB — CBC WITH DIFFERENTIAL/PLATELET
Abs Immature Granulocytes: 0.02 10*3/uL (ref 0.00–0.07)
Basophils Absolute: 0 10*3/uL (ref 0.0–0.1)
Basophils Relative: 0 %
Eosinophils Absolute: 0 10*3/uL (ref 0.0–0.5)
Eosinophils Relative: 0 %
HCT: 38.4 % — ABNORMAL LOW (ref 39.0–52.0)
Hemoglobin: 13.4 g/dL (ref 13.0–17.0)
Immature Granulocytes: 1 %
Lymphocytes Relative: 9 %
Lymphs Abs: 0.4 10*3/uL — ABNORMAL LOW (ref 0.7–4.0)
MCH: 28.9 pg (ref 26.0–34.0)
MCHC: 34.9 g/dL (ref 30.0–36.0)
MCV: 82.9 fL (ref 80.0–100.0)
Monocytes Absolute: 0.5 10*3/uL (ref 0.1–1.0)
Monocytes Relative: 12 %
Neutro Abs: 3.5 10*3/uL (ref 1.7–7.7)
Neutrophils Relative %: 78 %
Platelets: 157 10*3/uL (ref 150–400)
RBC: 4.63 MIL/uL (ref 4.22–5.81)
RDW: 12.9 % (ref 11.5–15.5)
WBC: 4.4 10*3/uL (ref 4.0–10.5)
nRBC: 0 % (ref 0.0–0.2)

## 2022-03-05 LAB — TSH: TSH: 2.747 u[IU]/mL (ref 0.350–4.500)

## 2022-03-05 LAB — TROPONIN I (HIGH SENSITIVITY): Troponin I (High Sensitivity): 12 ng/L (ref <18)

## 2022-03-05 LAB — MAGNESIUM: Magnesium: 2 mg/dL (ref 1.7–2.4)

## 2022-03-05 LAB — LIPASE, BLOOD: Lipase: 24 U/L (ref 11–51)

## 2022-03-05 MED ORDER — ONDANSETRON HCL 4 MG/2ML IJ SOLN
4.0000 mg | Freq: Once | INTRAMUSCULAR | Status: AC
  Administered 2022-03-05: 4 mg via INTRAVENOUS
  Filled 2022-03-05: qty 2

## 2022-03-05 MED ORDER — LACTATED RINGERS IV BOLUS
1000.0000 mL | Freq: Once | INTRAVENOUS | Status: AC
Start: 2022-03-05 — End: 2022-03-05
  Administered 2022-03-05: 1000 mL via INTRAVENOUS

## 2022-03-05 NOTE — ED Triage Notes (Signed)
Pt arrives to ED with c/o weakness. Pt reports he has been experiencing shortness of breath, fatigue, fever, cough, body aches since his COVID diagnosis x6 weeks ago. Was seen last week for same and given Levaquin '750mg'$ .  ?

## 2022-03-05 NOTE — Discharge Instructions (Addendum)
Talk to your primary care doctor about medications that may help stimulate appetite or help with malaise.  Return to the emergency department at any time if you experience any new or worsening severity of symptoms. ?

## 2022-03-05 NOTE — ED Provider Notes (Signed)
?[facility] ?Provider Note ? ? ?CSN: [id number] ?Arrival date & time: 03/05/22  9622 ? ?  ? ?History ? ?Chief Complaint  ?Patient presents with  ? Weakness  ? ? ?Ethan Stewart is a 76 y.o. male. ? ? ?Weakness ?Associated symptoms: cough and nausea   ?Patient presenting for fatigue and generalized weakness.  Medical history includes lymphoma, GERD, depression, anxiety, malnutrition, DVT, hypothyroidism.  He had COVID-19 6 weeks ago and has had a functional decline since that time.  He was seen in the emergency department 5 days ago for similar symptoms.  He underwent diagnostic work-up at that time, including CTA of chest.  CTA did not show PE but he did have a multifocal pneumonia.  He was prescribed Levaquin.  He has been taking this as prescribed and his last dose will be tonight.  Despite antibiotics, he reports persistent cough, although not worsening, generalized weakness, fatigue, continued loss of appetite, nausea, and constipation. ?  ? ?Home Medications ?Prior to Admission medications   ?Medication Sig Start Date End Date Taking? Authorizing Provider  ?azithromycin (ZITHROMAX) 500 MG tablet Take 500 mg by mouth daily. 02/25/22   [provider]  ?cholecalciferol (VITAMIN D3) 25 MCG (1000 UNIT) tablet 1 tablet    [provider]  ?diazepam (VALIUM) 2 MG tablet Take 2 mg by mouth daily as needed for anxiety.    [provider]  ?ergocalciferol (VITAMIN D2) 1.25 MG (50000 UT) capsule Take 1 capsule (50,000 Units total) by mouth once a week. 01/17/20   Brunetta Genera, MD  ?levofloxacin (LEVAQUIN) 750 MG tablet Take 1 tablet (750 mg total) by mouth daily for 5 days. 03/01/22 03/06/22  Wyvonnia Dusky, MD  ?levothyroxine (SYNTHROID, LEVOTHROID) 88 MCG tablet Take 88 mcg by mouth daily before breakfast.  08/16/18   [provider]  ?Multiple Vitamins-Minerals (CENTRUM SILVER PO) Take by mouth daily.    [provider]  ?ondansetron  (ZOFRAN) 4 MG tablet Take 4-8 mg by mouth 3 (three) times daily as needed. 02/25/22   [provider]  ?polyethylene glycol powder (GLYCOLAX/MIRALAX) 17 GM/SCOOP powder TAKE AS DIRECTED BY MOUTH 05/04/20   [provider]  ?senna-docusate (SENOKOT-S) 8.6-50 MG tablet Take 1 tablet by mouth daily as needed for up to 15 doses for mild constipation. 02/28/22   Wyvonnia Dusky, MD  ?simvastatin (ZOCOR) 20 MG tablet Take 20 mg by mouth daily.    [provider]  ?tamsulosin (FLOMAX) 0.4 MG CAPS capsule Take 1 capsule (0.4 mg total) by mouth at bedtime. 01/17/20   Brunetta Genera, MD  ?temazepam (RESTORIL) 15 MG capsule Take 15 mg by mouth at bedtime as needed for sleep.     [provider]  ?   ? ?Allergies    ?Pegfilgrastim and Amoxicillin   ? ?Review of Systems   ?Review of Systems  ?Constitutional:  Positive for activity change, appetite change, fatigue and unexpected weight change.  ?Respiratory:  Positive for cough.   ?Gastrointestinal:  Positive for nausea.  ?Neurological:  Positive for weakness (Generalized).  ?All other systems reviewed and are negative. ? ?Physical Exam ?Updated Vital Signs ?BP 127/72   Pulse 78   Temp 99.6 ?F (37.6 ?C) (Oral)   Resp (!) 23   SpO2 95%  ?Physical Exam ?Vitals and nursing note reviewed.  ?Constitutional:   ?   General: He is not in acute distress. ?   Appearance: Normal appearance. He is well-developed and normal weight. He  is not ill-appearing, toxic-appearing or diaphoretic.  ?HENT:  ?   Head: Normocephalic and atraumatic.  ?   Right Ear: External ear normal.  ?   Left Ear: External ear normal.  ?   Nose: Nose normal.  ?   Mouth/Throat:  ?   Mouth: Mucous membranes are moist.  ?   Pharynx: Oropharynx is clear.  ?Eyes:  ?   Extraocular Movements: Extraocular movements intact.  ?   Conjunctiva/sclera: Conjunctivae normal.  ?Cardiovascular:  ?   Rate and Rhythm: Normal rate and regular rhythm.  ?   Heart sounds: No murmur heard. ?Pulmonary:   ?   Effort: Pulmonary effort is normal. No respiratory distress.  ?   Breath sounds: Normal breath sounds. No wheezing, rhonchi or rales.  ?Abdominal:  ?   Palpations: Abdomen is soft.  ?   Tenderness: There is no abdominal tenderness.  ?Musculoskeletal:     ?   General: No swelling. Normal range of motion.  ?   Cervical back: Normal range of motion and neck supple. No rigidity.  ?   Right lower leg: No edema.  ?   Left lower leg: No edema.  ?Skin: ?   General: Skin is warm and dry.  ?   Capillary Refill: Capillary refill takes less than 2 seconds.  ?   Coloration: Skin is not jaundiced or pale.  ?Neurological:  ?   General: No focal deficit present.  ?   Mental Status: He is alert and oriented to person, place, and time.  ?   Cranial Nerves: No cranial nerve deficit.  ?   Sensory: No sensory deficit.  ?   Motor: No weakness.  ?   Coordination: Coordination normal.  ?Psychiatric:     ?   Mood and Affect: Mood normal. Affect is flat.     ?   Speech: Speech normal.     ?   Behavior: Behavior normal. Behavior is cooperative.  ? ? ?ED Results / Procedures / Treatments   ?Labs ?(all labs ordered are listed, but only abnormal results are displayed) ?Labs Reviewed  ?CBC WITH DIFFERENTIAL/PLATELET - Abnormal; Notable for the following components:  ?    Result Value  ? HCT 38.4 (*)   ? Lymphs Abs 0.4 (*)   ? All other components within normal limits  ?COMPREHENSIVE METABOLIC PANEL - Abnormal; Notable for the following components:  ? Calcium 8.8 (*)   ? Total Protein 6.2 (*)   ? AST 77 (*)   ? ALT 54 (*)   ? All other components within normal limits  ?MAGNESIUM  ?TSH  ?LIPASE, BLOOD  ?TROPONIN I (HIGH SENSITIVITY)  ? ? ?EKG ?EKG Interpretation ? ?Date/Time:  Tuesday March 05 2022 10:12:11 EDT ?Ventricular Rate:  89 ?PR Interval:  177 ?QRS Duration: 159 ?QT Interval:  391 ?QTC Calculation: 476 ?R Axis:   -68 ?Text Interpretation: Sinus rhythm Right bundle branch block Confirmed by Godfrey Pick [id number]) on 03/05/2022 11:56:20  AM ? ?Radiology ?DG Chest Portable 1 View ? ?Result Date: 03/05/2022 ?CLINICAL DATA:  A 76 year old male presents for evaluation of fatigue. Recent diagnosis of COVID-19 and pneumonia. EXAM: PORTABLE CHEST 1 VIEW COMPARISON:  February 28, 2022. FINDINGS: EKG leads project over the chest. Image rotated to the RIGHT. Ground-glass with bandlike changes extending into the RIGHT lung apex with similar appearance to the prior study. No visible pneumothorax. No signs of pleural effusion on frontal radiograph. Vague opacity also at the LEFT lung base without substantial change.  Cardiomediastinal contours are stable accounting for rotation on the current study. On limited assessment there is no acute skeletal finding. IMPRESSION: Similar appearance of bandlike changes extending into the RIGHT lung apex and LEFT lung base. Overall no change from recent imaging. Findings could reflect sequela of COVID-19 infection or interval superinfection, suggest attention on follow-up. Electronically Signed   By: Zetta Bills M.D.   On: 03/05/2022 11:27   ? ?Procedures ?Procedures  ? ? ?Medications Ordered in ED ?Medications  ?lactated ringers bolus 1,000 mL (0 mLs Intravenous Stopped 03/05/22 1327)  ?ondansetron [facility]) injection 4 mg (4 mg Intravenous Given 03/05/22 1133)  ? ? ?ED Course/ Medical Decision Making/ A&P ?  ?                        ?Medical Decision Making ?Amount and/or Complexity of Data Reviewed ?Labs: ordered. ?Radiology: ordered. ? ?Risk ?Prescription drug management. ? ? ?This patient presents to the ED for concern of malaise, this involves an extensive number of treatment options, and is a complaint that carries with it a high risk of complications and morbidity.  The differential diagnosis includes persistent COVID-19 symptoms, pneumonia, depression, dehydration, recurrence of lymphoma, malnutrition, hypothyroidism ? ? ?Co morbidities that complicate the patient evaluation ? ?lymphoma, GERD, depression, anxiety,  malnutrition, DVT, hypothyroidism ? ? ?Additional history obtained: ? ?Additional history obtained from patient's wife ?External records from outside source obtained and reviewed including EMR ? ? ?Lab Tests: ? ?I Ordered,

## 2022-03-05 NOTE — ED Notes (Signed)
Patient verbalizes understanding of discharge instructions. Opportunity for questioning and answers were provided. Patient discharged from ED.  °

## 2022-03-08 DIAGNOSIS — R000 Tachycardia, unspecified: Secondary | ICD-10-CM | POA: Diagnosis not present

## 2022-03-08 DIAGNOSIS — R531 Weakness: Secondary | ICD-10-CM | POA: Diagnosis not present

## 2022-03-08 DIAGNOSIS — U099 Post covid-19 condition, unspecified: Secondary | ICD-10-CM | POA: Diagnosis not present

## 2022-03-08 DIAGNOSIS — R0602 Shortness of breath: Secondary | ICD-10-CM | POA: Diagnosis not present

## 2022-03-08 DIAGNOSIS — J189 Pneumonia, unspecified organism: Secondary | ICD-10-CM | POA: Diagnosis not present

## 2022-03-18 DIAGNOSIS — R051 Acute cough: Secondary | ICD-10-CM | POA: Diagnosis not present

## 2022-04-03 ENCOUNTER — Ambulatory Visit (INDEPENDENT_AMBULATORY_CARE_PROVIDER_SITE_OTHER): Payer: No Typology Code available for payment source | Admitting: Pulmonary Disease

## 2022-04-03 ENCOUNTER — Encounter: Payer: Self-pay | Admitting: Pulmonary Disease

## 2022-04-03 VITALS — BP 122/82 | HR 81 | Temp 98.4°F | Ht 73.0 in | Wt 205.0 lb

## 2022-04-03 DIAGNOSIS — J189 Pneumonia, unspecified organism: Secondary | ICD-10-CM

## 2022-04-03 DIAGNOSIS — U099 Post covid-19 condition, unspecified: Secondary | ICD-10-CM

## 2022-04-03 NOTE — Patient Instructions (Signed)
Continue current efforts at getting stronger ? ?Encourage continuing regular exercises ? ?We will repeat a CT scan in about 10 to 12 weeks ? ?Follow-up in about 3 months-follow-up will be about a week or 2 after your CAT scan ? ?Call with significant concerns ?

## 2022-04-03 NOTE — Progress Notes (Signed)
? ?      ?COMPTON BRIGANCE    [id number]    [birth year] ? ?Primary Care Physician:Ross, Dwyane Luo, MD ? ?Referring Physician: Lawerance Cruel, MD ?[address] ?[place],  [place] [address] ? ?Chief complaint:   ?Weakness, shortness of breath ? ?HPI: ? ?Symptoms are improving currently ?On Megace  ? ?lost about 30 pounds in the process of having COVID and also a pneumonia a few weeks afterwards ? ?Symptoms are currently improving, trying to get back into exercising regularly ? ?For his recent pneumonia did have a course of Levaquin ? ?Prior to illness was able to walk about 2 to 3 miles a day, currently able to do about half a mile in the morning and half a mile in the evening ?Megace is helping his appetite and has managed to gain about 4 pounds back ? ?Overall feels is improving ? ?Denies a cough, does still have some fatigue ? ?History significant for history of non-Hodgkin's lymphoma in 2016 with a recurrence in 2019 ?Has been through chemo and radiation and also  ?During his treatment in 2016 for non-Hodgkin's lymphoma, did suffer ARDS, was on the ventilator for a while and required rehab-recovered fully ? ?Past history of smoking-quit in 1993 ? ?No level of any lung disease previously ? ?Not usually on any inhalers or significant shortness of breath with activities ? 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 ?Outpatient Encounter Medications as of 04/03/2022  ?Medication Sig  ? cholecalciferol (VITAMIN D3) 25 MCG (1000 UNIT) tablet 1 tablet  ? diazepam (VALIUM) 2 MG tablet Take 2 mg by mouth daily as needed for anxiety.  ? levothyroxine (SYNTHROID, LEVOTHROID) 88 MCG  tablet Take 88 mcg by mouth daily before breakfast.   ? megestrol (MEGACE) 20 MG tablet Take 20 mg by mouth daily.  ? Multiple Vitamins-Minerals (CENTRUM SILVER PO) Take by mouth daily.  ? ondansetron (ZOFRAN) 4 MG tablet Take 4-8 mg by mouth 3 (three) times daily as needed.  ? polyethylene glycol powder (GLYCOLAX/MIRALAX) 17 GM/SCOOP powder TAKE AS DIRECTED BY MOUTH  ? senna-docusate (SENOKOT-S) 8.6-50 MG tablet Take 1 tablet by mouth daily as needed for up to 15 doses for mild constipation.  ? simvastatin (ZOCOR) 20 MG tablet Take 20 mg by mouth daily.  ? tamsulosin (FLOMAX) 0.4 MG CAPS capsule Take 1 capsule (0.4 mg total) by mouth at bedtime.  ? temazepam (RESTORIL) 15 MG capsule Take 15 mg by mouth at bedtime as needed for sleep.   ? vitamin B-12 (CYANOCOBALAMIN) 500 MCG tablet Take 500 mcg by mouth daily.  ? [DISCONTINUED] azithromycin (ZITHROMAX) 500 MG tablet Take 500 mg by mouth daily. (Patient not taking: Reported on 04/03/2022)  ? [DISCONTINUED] ergocalciferol (VITAMIN D2) 1.25 MG (50000 UT) capsule Take 1 capsule (50,000 Units total) by mouth once a week. (Patient not taking: Reported on 04/03/2022)  ? [DISCONTINUED] LAGEVRIO 200 MG CAPS capsule SMARTSIG:4 Capsule(s) By Mouth Every 12 Hours (Patient not taking: Reported on 04/03/2022)  ? [DISCONTINUED] predniSONE (DELTASONE) 10 MG tablet Take by mouth. (Patient not taking: Reported on 04/03/2022)  ? ?No facility-administered encounter medications on file as of 04/03/2022.  ? ? ?Allergies as of 04/03/2022 - Review Complete 03/05/2022  ?  Allergen Reaction Noted  ? Pegfilgrastim Other (See Comments) 09/02/2015  ? Amoxicillin Other (See Comments) 02/25/2022  ? ? ?Past Medical History:  ?Diagnosis Date  ? Anxiety   ? ARDS (adult respiratory distress syndrome) ([place])   ? 2016 [facility] at [facility]  ? Arm numbness 08/07/15  ? Limited movement due to tumor on spine  ? B-cell lymphoma ([place])   ? Bone cancer ([place])   ?  tumor on C5  ? Cervical spine tumor 07/03/2015  ? Diffuse large  B cell lymphoma ([place])   ? biposy 07/25/2105  ? GERD (gastroesophageal reflux disease)   ? H. pylori infection   ? History of bleeding ulcers 1990's  ? History of blood transfusion   ? Hypothyroidism   ? ? ?Past Surgical History:  ?Procedure Laterality Date  ? ANTERIOR CERVICAL CORPECTOMY N/A 07/26/2015  ? Procedure: Cervical five Corpectomy/Cervical four-six Fusion/Plate ;  Surgeon: Eustace Moore, MD;  Location: [facility];  Service: Neurosurgery;  Laterality: N/A;  C5 Corpectomy/C4-6 Fusion/Plate C4-6  ? COLONOSCOPY    ? EYE SURGERY Right   ? cataracts  ? HERNIA REPAIR Bilateral   ? with mesh  ? IR GENERIC HISTORICAL  02/03/2017  ? IR REMOVAL TUN ACCESS W/ PORT W/O FL MOD SED 02/03/2017 Markus Daft, MD [facility]  ? IR REMOVAL TUN ACCESS W/ PORT W/O FL MOD SED  06/28/2020  ? LYMPH NODE BIOPSY Right 12/11/2018  ? Procedure: EXCISIONAL BIOPSY OF DEEP RIGHT INGUINAL LYMPH NODE ERAS PATHWAY;  Surgeon: Fanny Skates, MD;  Location: [facility] [facility];  Service: General;  Laterality: Right;  ? ? ?Family History  ?Problem Relation Age of Onset  ? Hypertension Mother   ? ? ?Social History  ? ?Socioeconomic History  ? Marital status: Married  ?  Spouse name: Not on file  ? Number of children: Not on file  ? Years of education: Not on file  ? Highest education level: Not on file  ?Occupational History  ? Not on file  ?Tobacco Use  ? Smoking status: Former  ?  Packs/day: 1.00  ?  Years: 10.00  ?  Pack years: 10.00  ?  Types: Cigarettes  ?  Quit date: 12/23/1981  ?  Years since quitting: 40.3  ? Smokeless tobacco: Never  ?Vaping Use  ? Vaping Use: Never used  ?Substance and Sexual Activity  ? Alcohol use: Yes  ?  Alcohol/week: 5.0 standard drinks  ?  Types: 2 Glasses of wine, 3 Cans of beer per week  ? Drug use: No  ? Sexual activity: Yes  ?Other Topics Concern  ? Not on file  ?Social History Narrative  ? Not on file  ? ?Social Determinants of Health  ? ?Financial Resource Strain: Not on file  ?Food Insecurity: Not on file  ?Transportation  Needs: Not on file  ?Physical Activity: Not on file  ?Stress: Not on file  ?Social Connections: Not on file  ?Intimate Partner Violence: Not on file  ? ? ?Review of Systems  ?Constitutional:  Positive for fatigue.  ?Respiratory:  Positive for shortness of breath.   ? ?Vitals:  ? 04/03/22 0928  ?BP: 122/82  ?Pulse: 81  ?Temp: 98.4 ?F (36.9 ?C)  ?SpO2: 97%  ? ? ? ?Physical Exam ?Constitutional:   ?   Appearance: Normal appearance.  ?HENT:  ?   Head: Normocephalic.  ?   Mouth/Throat:  ?   Mouth: Mucous membranes are moist.  ?Cardiovascular:  ?   Rate and Rhythm: Normal  rate and regular rhythm.  ?   Heart sounds: No murmur heard. ?  No friction rub.  ?Pulmonary:  ?   Effort: No respiratory distress.  ?   Breath sounds: No stridor. No wheezing or rhonchi.  ?Musculoskeletal:     ?   General: Normal range of motion.  ?   Cervical back: No rigidity or tenderness.  ?Neurological:  ?   General: No focal deficit present.  ?   Mental Status: He is alert.  ? ? ?Data Reviewed: ?Records from the [place] reviewed ? ?Oncology notes reviewed-Dr. Grier Mitts notes ? ?Notes from recent ED visit 314 reviewed ? ?CT scan 02/28/2022 and 02/08/2022 reviewed with the patient ? ?Assessment:  ?Recent COVID infection ? ?Recent pneumonia ? ?Overall, patient improving ? ?Still has fatigue ? ?Recent significant weight loss and deconditioning ?-Appears to be improving ? ?Abnormal CT scan of the chest showing multifocal infiltrates ? ?Past history of non-Hodgkin's lymphoma ? ?Past chemoradiation for non-Hodgkin's lymphoma ? ?Plan/Recommendations: ?Overall appears to be improving ? ?Significant weight loss likely from loss of muscle mass and resulting in significant deconditioning ? ?Has he appears to be gaining more weight with appetite stimulation, expectation is that he continues to improve ? ?Repeat CT scan in about 3 months is appropriate to ensure that the infiltrative process does clear ? ?Encouraged to stay active ? ?Encouraged to call with any  significant concerns ? ? ?Sherrilyn Rist MD ? Pulmonary and Critical Care ?04/03/2022, 10:05 AM ? ?CC: Lawerance Cruel, MD ? ? ?

## 2022-04-04 ENCOUNTER — Encounter: Payer: Self-pay | Admitting: Pulmonary Disease

## 2022-04-11 DIAGNOSIS — U071 COVID-19: Secondary | ICD-10-CM | POA: Diagnosis not present

## 2022-04-11 DIAGNOSIS — Z20828 Contact with and (suspected) exposure to other viral communicable diseases: Secondary | ICD-10-CM | POA: Diagnosis not present

## 2022-04-17 DIAGNOSIS — G479 Sleep disorder, unspecified: Secondary | ICD-10-CM | POA: Diagnosis not present

## 2022-04-17 DIAGNOSIS — R69 Illness, unspecified: Secondary | ICD-10-CM | POA: Diagnosis not present

## 2022-04-17 DIAGNOSIS — F411 Generalized anxiety disorder: Secondary | ICD-10-CM | POA: Diagnosis not present

## 2022-04-29 DIAGNOSIS — C44319 Basal cell carcinoma of skin of other parts of face: Secondary | ICD-10-CM | POA: Diagnosis not present

## 2022-04-29 DIAGNOSIS — L720 Epidermal cyst: Secondary | ICD-10-CM | POA: Diagnosis not present

## 2022-04-29 DIAGNOSIS — L814 Other melanin hyperpigmentation: Secondary | ICD-10-CM | POA: Diagnosis not present

## 2022-04-29 DIAGNOSIS — L570 Actinic keratosis: Secondary | ICD-10-CM | POA: Diagnosis not present

## 2022-04-29 DIAGNOSIS — L821 Other seborrheic keratosis: Secondary | ICD-10-CM | POA: Diagnosis not present

## 2022-04-29 DIAGNOSIS — Z85828 Personal history of other malignant neoplasm of skin: Secondary | ICD-10-CM | POA: Diagnosis not present

## 2022-06-05 DIAGNOSIS — C44319 Basal cell carcinoma of skin of other parts of face: Secondary | ICD-10-CM | POA: Diagnosis not present

## 2022-06-11 ENCOUNTER — Ambulatory Visit (HOSPITAL_COMMUNITY)
Admission: RE | Admit: 2022-06-11 | Discharge: 2022-06-11 | Disposition: A | Discharge status: Home / Self Care | Payer: No Typology Code available for payment source | Source: Ambulatory Visit | Attending: Family Medicine | Admitting: Family Medicine

## 2022-06-11 ENCOUNTER — Encounter (HOSPITAL_COMMUNITY): Payer: Self-pay

## 2022-06-11 DIAGNOSIS — I2510 Atherosclerotic heart disease of native coronary artery without angina pectoris: Secondary | ICD-10-CM | POA: Diagnosis not present

## 2022-06-11 DIAGNOSIS — U099 Post covid-19 condition, unspecified: Secondary | ICD-10-CM | POA: Insufficient documentation

## 2022-06-11 DIAGNOSIS — J189 Pneumonia, unspecified organism: Secondary | ICD-10-CM | POA: Diagnosis not present

## 2022-06-11 DIAGNOSIS — I700 Atherosclerosis of aorta: Secondary | ICD-10-CM | POA: Diagnosis not present

## 2022-06-21 ENCOUNTER — Other Ambulatory Visit: Payer: Self-pay

## 2022-06-21 DIAGNOSIS — C8298 Follicular lymphoma, unspecified, lymph nodes of multiple sites: Secondary | ICD-10-CM

## 2022-06-24 ENCOUNTER — Inpatient Hospital Stay: Payer: No Typology Code available for payment source

## 2022-06-24 ENCOUNTER — Other Ambulatory Visit: Payer: Self-pay

## 2022-06-24 ENCOUNTER — Inpatient Hospital Stay: Payer: No Typology Code available for payment source | Attending: Hematology | Admitting: Hematology

## 2022-06-24 VITALS — BP 136/80 | HR 72 | Temp 97.7°F | Resp 16 | Wt 212.8 lb

## 2022-06-24 DIAGNOSIS — C8298 Follicular lymphoma, unspecified, lymph nodes of multiple sites: Secondary | ICD-10-CM | POA: Diagnosis not present

## 2022-06-24 DIAGNOSIS — Z8572 Personal history of non-Hodgkin lymphomas: Secondary | ICD-10-CM | POA: Insufficient documentation

## 2022-06-24 LAB — CMP (CANCER CENTER ONLY)
ALT: 21 U/L (ref 0–44)
AST: 24 U/L (ref 15–41)
Albumin: 3.8 g/dL (ref 3.5–5.0)
Alkaline Phosphatase: 78 U/L (ref 38–126)
Anion gap: 7 (ref 5–15)
BUN: 14 mg/dL (ref 8–23)
CO2: 25 mmol/L (ref 22–32)
Calcium: 9.1 mg/dL (ref 8.9–10.3)
Chloride: 107 mmol/L (ref 98–111)
Creatinine: 0.91 mg/dL (ref 0.61–1.24)
GFR, Estimated: >60 mL/min (ref >60)
Glucose, Bld: 94 mg/dL (ref 70–99)
Potassium: 4.2 mmol/L (ref 3.5–5.1)
Sodium: 139 mmol/L (ref 135–145)
Total Bilirubin: 0.3 mg/dL (ref 0.3–1.2)
Total Protein: 6.4 g/dL — ABNORMAL LOW (ref 6.5–8.1)

## 2022-06-24 LAB — CBC WITH DIFFERENTIAL (CANCER CENTER ONLY)
Abs Immature Granulocytes: 0.02 10*3/uL (ref 0.00–0.07)
Basophils Absolute: 0 10*3/uL (ref 0.0–0.1)
Basophils Relative: 1 %
Eosinophils Absolute: 0.1 10*3/uL (ref 0.0–0.5)
Eosinophils Relative: 2 %
HCT: 35.2 % — ABNORMAL LOW (ref 39.0–52.0)
Hemoglobin: 11.7 g/dL — ABNORMAL LOW (ref 13.0–17.0)
Immature Granulocytes: 0 %
Lymphocytes Relative: 16 %
Lymphs Abs: 0.9 10*3/uL (ref 0.7–4.0)
MCH: 27.5 pg (ref 26.0–34.0)
MCHC: 33.2 g/dL (ref 30.0–36.0)
MCV: 82.8 fL (ref 80.0–100.0)
Monocytes Absolute: 0.7 10*3/uL (ref 0.1–1.0)
Monocytes Relative: 13 %
Neutro Abs: 3.8 10*3/uL (ref 1.7–7.7)
Neutrophils Relative %: 68 %
Platelet Count: 190 10*3/uL (ref 150–400)
RBC: 4.25 MIL/uL (ref 4.22–5.81)
RDW: 15.6 % — ABNORMAL HIGH (ref 11.5–15.5)
WBC Count: 5.6 10*3/uL (ref 4.0–10.5)
nRBC: 0 % (ref 0.0–0.2)

## 2022-06-24 LAB — LACTATE DEHYDROGENASE: LDH: 141 U/L (ref 98–192)

## 2022-06-24 NOTE — Progress Notes (Signed)
[place]   [phone] [phone]   Hematology oncology clinic note  Patient Care Team: Lawerance Cruel, MD as PCP - General (Family Medicine)  Date of Service:  06/24/2022   CHIEF COMPLAINTS/PURPOSE OF CONSULTATION:  Continued f/u for evaluation and management of FL and DLBCL  Oncology History  DLBCL (diffuse large B cell lymphoma) ([place])  06/21/2015 Imaging   CT Chest/abd/pelvis: IMPRESSION: 5 mm nodule within the right lower lobe as described.   Pneumobilia consistent with a prior sphincterotomy.   No acute abnormality is identified. No findings to suggest chest etiology for the known lesion at C5 are seen.   06/21/2015 Imaging   MRI c spine1. Diffuse marrow replacement of the C5 vertebral body highly concerning for neoplasm (metastatic disease, myeloma, or lymphoma). Epidural tumor at this level results in moderate spinal stenosis with moderate impression on the spinal cord    07/10/2015 PET scan   1. The previously demonstrated abnormal C5 vertebral body is hypermetabolic. No other osseous lesions demonstrated. 2. There are small hypermetabolic lymph nodes within the left mesentery and both inguinal regions.    07/26/2015 Initial Biopsy   Had a C5 corpectomy pathology showed diffuse large B-cell lymphoma   08/08/2015 -  Chemotherapy   Started for cycle of R CHOP.    08/09/2015 -  Chemotherapy   Received first cycle of intrathecal methotrexate plus hydrocortisone for CNS prophylaxis.   02/05/2019 -  Chemotherapy   Patient is on Treatment Plan : NON-HODGKINS LYMPHOMA Rituximab q month        HISTORY OF PRESENTING ILLNESS: Please see previous note for details on initial presentation  INTERVAL HISTORY:  Ethan Stewart is a 76 y.o. male who is here for follow-up of follicular lymphoma and last planned cycle of maintenance Rituxan. He reports He is doing well with no new symptoms or concerns.  He notes recent excision of basal cell  carcinoma on left side of face.  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  He continues to take multivitamin and vitamin B12 and tolerates them well.  Good p.o. intake.  No fever, chills, night sweats. No new lumps, bumps, or lesions/rashes. No abdominal pain or change in bowel habits. No new or unexpected weight loss. No SOB or chest pain. No other new or acute focal symptoms.  Labs done today were reviewed with the patient in detail.  We discussed his recent CT chest w/o contrast done 06/11/2022.  He is accompanied for this visit by his wife.  REVIEW OF SYSTEMS: 10 Point review of Systems was done is negative except as noted above.   MEDICAL HISTORY:  Past Medical History:  Diagnosis Date   Anxiety    ARDS (adult respiratory distress syndrome) ([place])    2016 August at [facility]   Arm numbness 08/07/15   Limited movement due to tumor on spine   B-cell lymphoma ([place])    Bone cancer ([place])     tumor on C5   Cervical spine tumor 07/03/2015   Diffuse large B cell lymphoma ([place])    biposy 07/25/2105   GERD (gastroesophageal reflux disease)    H. pylori infection    History of bleeding ulcers 1990's   History of blood transfusion    Hypothyroidism     SURGICAL HISTORY: Past Surgical History:  Procedure Laterality Date   ANTERIOR CERVICAL CORPECTOMY N/A 07/26/2015  Procedure: Cervical five Corpectomy/Cervical four-six Fusion/Plate ;  Surgeon: Eustace Moore, MD;  Location: [place] [facility];  Service: Neurosurgery;  Laterality: N/A;  C5 Corpectomy/C4-6 Fusion/Plate C4-6   COLONOSCOPY     EYE SURGERY Right    cataracts   HERNIA REPAIR Bilateral    with mesh   IR GENERIC HISTORICAL  02/03/2017   IR REMOVAL TUN ACCESS W/ PORT W/O FL MOD SED 02/03/2017 Markus Daft, MD [facility]   IR REMOVAL TUN ACCESS W/ PORT W/O FL MOD SED  06/28/2020   LYMPH NODE BIOPSY Right 12/11/2018    Procedure: EXCISIONAL BIOPSY OF DEEP RIGHT INGUINAL LYMPH NODE ERAS PATHWAY;  Surgeon: Fanny Skates, MD;  Location: [place];  Service: General;  Laterality: Right;    SOCIAL HISTORY: Social History   Socioeconomic History   Marital status: Married    Spouse name: Not on file   Number of children: Not on file   Years of education: Not on file   Highest education level: Not on file  Occupational History   Not on file  Tobacco Use   Smoking status: Former    Packs/day: 1.00    Years: 10.00    Total pack years: 10.00    Types: Cigarettes    Quit date: 12/23/1981    Years since quitting: 40.5   Smokeless tobacco: Never  Vaping Use   Vaping Use: Never used  Substance and Sexual Activity   Alcohol use: Yes    Alcohol/week: 5.0 standard drinks of alcohol    Types: 2 Glasses of wine, 3 Cans of beer per week   Drug use: No   Sexual activity: Yes  Other Topics Concern   Not on file  Social History Narrative   Not on file   Social Determinants of Health   Financial Resource Strain: Not on file  Food Insecurity: Not on file  Transportation Needs: Not on file  Physical Activity: Not on file  Stress: Not on file  Social Connections: Not on file  Intimate Partner Violence: Not on file    FAMILY HISTORY: Family History  Problem Relation Age of Onset   Hypertension Mother     ALLERGIES:  is allergic to pegfilgrastim and amoxicillin.  MEDICATIONS:  Current Outpatient Medications  Medication Sig Dispense Refill   cholecalciferol (VITAMIN D3) 25 MCG (1000 UNIT) tablet 1 tablet     diazepam (VALIUM) 2 MG tablet Take 2 mg by mouth daily as needed for anxiety.     levothyroxine (SYNTHROID, LEVOTHROID) 88 MCG tablet Take 88 mcg by mouth daily before breakfast.   2   Multiple Vitamins-Minerals (CENTRUM SILVER PO) Take by mouth daily.     polyethylene glycol powder (GLYCOLAX/MIRALAX) 17 GM/SCOOP powder TAKE AS DIRECTED BY MOUTH     senna-docusate (SENOKOT-S) 8.6-50 MG tablet Take 1  tablet by mouth daily as needed for up to 15 doses for mild constipation. 15 tablet 0   simvastatin (ZOCOR) 20 MG tablet Take 20 mg by mouth daily.     tamsulosin (FLOMAX) 0.4 MG CAPS capsule Take 1 capsule (0.4 mg total) by mouth at bedtime. 30 capsule 3   temazepam (RESTORIL) 15 MG capsule Take 15 mg by mouth at bedtime as needed for sleep.      vitamin B-12 (CYANOCOBALAMIN) 500 MCG tablet Take 500 mcg by mouth daily.     megestrol (MEGACE) 20 MG tablet Take 20 mg by mouth daily. (Patient not taking: Reported on 06/24/2022)     ondansetron (ZOFRAN) 4 MG  tablet Take 4-8 mg by mouth 3 (three) times daily as needed. (Patient not taking: Reported on 06/24/2022)     No current facility-administered medications for this visit.    PHYSICAL EXAMINATION: ECOG PERFORMANCE STATUS: 0 .BP 136/80 (BP Location: Left Arm, Patient Position: Sitting)   Pulse 72   Temp 97.7 F (36.5 C) (Temporal)   Resp 16   Wt 212 lb 12.8 oz (96.5 kg)   SpO2 97%   BMI 28.08 kg/m  NAD GENERAL:alert, in no acute distress and comfortable SKIN: no acute rashes, no significant lesions EYES: conjunctiva are pink and non-injected, sclera anicteric NECK: supple, no JVD LYMPH:  no palpable lymphadenopathy in the cervical, axillary or inguinal regions LUNGS: minimal dry rales lung basis posteriorly. HEART: regular rate & rhythm ABDOMEN:  normoactive bowel sounds , non tender, not distended. Extremity: no pedal edema PSYCH: alert & oriented x 3 with fluent speech NEURO: no focal motor/sensory deficits  LABORATORY DATA:   I have reviewed the data as listed    Latest Ref Rng & Units 06/24/2022    8:13 AM 03/05/2022   11:32 AM 02/28/2022    2:37 PM  CBC  WBC 4.0 - 10.5 K/uL 5.6  4.4  3.8   Hemoglobin 13.0 - 17.0 g/dL 11.7  13.4  14.0   Hematocrit 39.0 - 52.0 % 35.2  38.4  41.3   Platelets 150 - 400 K/uL 190  157  143        Latest Ref Rng & Units 06/24/2022    8:13 AM 03/05/2022   11:32 AM 02/28/2022    2:37 PM  CMP   Glucose 70 - 99 mg/dL 94  98  134   BUN 8 - 23 mg/dL '14  21  17   '$ Creatinine 0.61 - 1.24 mg/dL 0.91  1.12  1.24   Sodium 135 - 145 mmol/L 139  136  137   Potassium 3.5 - 5.1 mmol/L 4.2  4.0  4.0   Chloride 98 - 111 mmol/L 107  101  100   CO2 22 - 32 mmol/L '25  22  27   '$ Calcium 8.9 - 10.3 mg/dL 9.1  8.8  8.8   Total Protein 6.5 - 8.1 g/dL 6.4  6.2  6.6   Total Bilirubin 0.3 - 1.2 mg/dL 0.3  0.8  0.6   Alkaline Phos 38 - 126 U/L 78  64  62   AST 15 - 41 U/L 24  77  50   ALT 0 - 44 U/L 21  54  37     Lab Results  Component Value Date   LDH 141 06/24/2022    RADIOGRAPHIC STUDIES: I have personally reviewed the radiological images as listed and agreed with the findings in the report. CT Chest Wo Contrast  Result Date: 06/11/2022 CLINICAL DATA:  Post COVID condition. Pneumonia of both lower lobes due to infectious organism. Follow-up pneumonia. Cough. EXAM: CT CHEST WITHOUT CONTRAST TECHNIQUE: Multidetector CT imaging of the chest was performed following the standard protocol without IV contrast. RADIATION DOSE REDUCTION: This exam was performed according to the departmental dose-optimization program which includes automated exposure control, adjustment of the mA and/or kV according to patient size and/or use of iterative reconstruction technique. COMPARISON:  Radiograph 03/05/2022, CT 02/28/2022, additional prior chest CTs reviewed. FINDINGS: Cardiovascular: Atherosclerosis of the thoracic aorta. No aortic aneurysm. The left vertebral artery arises directly from the thoracic aorta, variant arch anatomy stable upper normal heart size. Coronary artery calcifications. No pericardial effusion.  Mediastinum/Nodes: No enlarged mediastinal, axillary or supraclavicular lymph nodes. No obvious hilar adenopathy on this unenhanced exam. The esophagus is decompressed. No visualized thyroid nodule. Lungs/Pleura: Near complete resolution of the right upper lobe ground-glass opacities from prior exam. There are  faint residual ground-glass opacities at site of previous airspace disease likely reflecting underlying scarring. The previous lingular opacities have completely resolved. No new airspace disease. Subsegmental scarring in the dependent lower lobes is similar to prior exam, partially obscured by motion artifact currently. Scattered pulmonary nodules are stable dating back to 2020 exam and considered benign. This includes 5 mm subpleural right lower lobe nodule series 7, image 99. Anterior right upper lobe nodule series 7, image 95, 3 to 4 mm. Basilar right lower lobe nodule measures 9 mm, unchanged, low obscured by motion on the current exam. No new pulmonary nodules. No pleural effusion. Trace apical pleuroparenchymal scarring. Trachea and central bronchi are patent. Upper Abdomen: Chronic pneumobilia. No acute upper abdominal findings. Musculoskeletal: Thoracic spondylosis with anterior spurring in the mid lower thoracic spine. Broad-based upper thoracic scoliosis. There are no acute or suspicious osseous abnormalities. No chest wall soft tissue abnormalities. IMPRESSION: 1. Near complete resolution of right upper lobe ground-glass opacities from prior exam. There are faint residual ground-glass opacities at site of previous airspace disease likely reflecting underlying scarring. The previous lingular opacities have completely resolved. 2. Scattered pulmonary nodules are stable dating back to 2020 exam and considered benign. No new or suspicious pulmonary nodule. No dedicated imaging follow-up is needed. 3. Aortic atherosclerosis.  Coronary artery calcifications. Aortic Atherosclerosis (ICD10-I70.0). Electronically Signed   By: Keith Rake M.D.   On: 06/11/2022 16:09    ASSESSMENT & PLAN:  Ethan Stewart is a 76 y.o.  male with a history of :  1 Follicular lymphoma -Previously treated with Revlimid and Rituxan, currently on Rituxan maintenance q2 months . CT chest abdomen pelvis on 06/27/2021 which  showed no evidence of lymphoma recurrence on his CT chest abdomen pelvis.  2.   history of diffuse large B-cell lymphoma currently in remission Status post treatment with modified dose reduced R-CHOP  3.  History of Neulasta related ARDS  PLAN -Labs from today reviewed with the patient. CBC shows reduced Hgb of 11.7 and HCT of 35.2% consistent with minimal anemia related to acute illness and nutritional changes. CMP unremarkable. LDH is 141. -Recent CT chest w/o contrast done 06/11/2022 revealed near complete resolution of ground glass opacities from previous exam and no other significant findings indicated. -Will continue to hold off on maintenance Rituxan and and stay in surveillance mode. -Patient has no lab, clinical or radiographic findings suggestive of recurrent/progressive lymphoma at this time  Follow-up Return to clinic with Dr. Irene Limbo with labs in 6 months  .The total time spent in the appointment was 25 minutes* .  All of the patient's questions were answered with apparent satisfaction. The patient knows to call the clinic with any problems, questions or concerns.   Sullivan Lone MD MS AAHIVMS [facility] Hematology/Oncology Physician [facility]  .*Total Encounter Time as defined by the Centers for Medicare and Medicaid Services includes, in addition to the face-to-face time of a patient visit (documented in the note above) non-face-to-face time: obtaining and reviewing outside history, ordering and reviewing medications, tests or procedures, care coordination (communications with other health care professionals or caregivers) and documentation in the medical record.   I, Melene Muller, am acting as scribe for Dr. Sullivan Lone, MD.  .I have  reviewed the above documentation for accuracy and completeness, and I agree with the above. Brunetta Genera MD

## 2022-06-27 ENCOUNTER — Telehealth: Payer: Self-pay | Admitting: Hematology

## 2022-06-27 NOTE — Telephone Encounter (Signed)
Left message with follow-up appointment per 7/3 los.

## 2022-06-30 ENCOUNTER — Encounter: Payer: Self-pay | Admitting: Hematology

## 2022-07-08 ENCOUNTER — Ambulatory Visit (INDEPENDENT_AMBULATORY_CARE_PROVIDER_SITE_OTHER): Payer: No Typology Code available for payment source | Admitting: Pulmonary Disease

## 2022-07-08 ENCOUNTER — Encounter: Payer: Self-pay | Admitting: Pulmonary Disease

## 2022-07-08 VITALS — BP 118/74 | HR 75 | Temp 97.8°F | Ht 73.0 in | Wt 211.0 lb

## 2022-07-08 DIAGNOSIS — R9389 Abnormal findings on diagnostic imaging of other specified body structures: Secondary | ICD-10-CM | POA: Diagnosis not present

## 2022-07-08 NOTE — Patient Instructions (Signed)
I will see you about a year from now  Call with significant concerns  Your CAT scan definitely does show significant improvement in the haziness in the lungs as we both reviewed, I do not think we need to repeat this at present unless you have symptoms

## 2022-07-08 NOTE — Progress Notes (Signed)
Ethan Stewart    [id number]    02/23/1946  Primary Care Physician:Ross, Dwyane Luo, MD  Referring Physician: Lawerance Cruel, MD [place],  [place] [address]  Chief complaint:   Weakness, shortness of breath  HPI:  Symptoms continue to improve  Has remained stable since the last visit  Has started exercising regularly  Appetite is good  We did review his CAT scan today which showed significant improvement in the groundglass changes No new findings on CT  Back to walking about 2 to 3 miles a day  Overall feels is improving  Denies a cough, does still have some fatigue  History significant for history of non-Hodgkin's lymphoma in 2016 with a recurrence in 2019 Has been through chemo and radiation and also  During his treatment in 2016 for non-Hodgkin's lymphoma, did suffer ARDS, was on the ventilator for a while and required rehab-recovered fully  Past history of smoking-quit in 1993  No level of any lung disease previously  Not usually on any inhalers or significant shortness of breath with activities  Other medical problems include history of GERD, history of depression/anxiety, history of hypothyroidism His oncology diagnoses include diffuse B-cell lymphoma stage IV AE with extranodal involvement of C5 vertebra s/p C5 corpectomy G-CSF related capillary leak syndrome and ARDS, left neck basal cell carcinoma -Had 6 cycles of R CHOP Continues to follow with oncology  Outpatient Encounter Medications as of 07/08/2022  Medication Sig   cholecalciferol (VITAMIN D3) 25 MCG (1000 UNIT) tablet 1 tablet   diazepam (VALIUM) 2 MG tablet Take 2 mg by mouth daily as needed for anxiety.   levothyroxine (SYNTHROID, LEVOTHROID) 88 MCG tablet Take 88 mcg by mouth daily before breakfast.    Multiple Vitamins-Minerals (CENTRUM SILVER PO) Take by mouth daily.   polyethylene glycol powder (GLYCOLAX/MIRALAX) 17 GM/SCOOP powder TAKE AS DIRECTED BY MOUTH    senna-docusate (SENOKOT-S) 8.6-50 MG tablet Take 1 tablet by mouth daily as needed for up to 15 doses for mild constipation.   simvastatin (ZOCOR) 20 MG tablet Take 20 mg by mouth daily.   tamsulosin (FLOMAX) 0.4 MG CAPS capsule Take 1 capsule (0.4 mg total) by mouth at bedtime.   temazepam (RESTORIL) 15 MG capsule Take 15 mg by mouth at bedtime as needed for sleep.    vitamin B-12 (CYANOCOBALAMIN) 500 MCG tablet Take 500 mcg by mouth daily.   [DISCONTINUED] megestrol (MEGACE) 20 MG tablet Take 20 mg by mouth daily. (Patient not taking: Reported on 06/24/2022)   [DISCONTINUED] ondansetron (ZOFRAN) 4 MG tablet Take 4-8 mg by mouth 3 (three) times daily as needed. (Patient not taking: Reported on 06/24/2022)   No facility-administered encounter medications on file as of 07/08/2022.    Allergies as of 07/08/2022 - Review Complete 07/08/2022  Allergen Reaction Noted   Pegfilgrastim Other (See Comments) 09/02/2015   Amoxicillin Other (See Comments) 02/25/2022    Past Medical History:  Diagnosis Date   Anxiety    ARDS (adult respiratory distress syndrome) ([place])    2016 August at [facility]   Arm numbness 08/07/15   Limited movement due to tumor on spine   B-cell lymphoma ([place])    Bone cancer ([place])     tumor on C5   Cervical spine tumor 07/03/2015   Diffuse large B cell lymphoma ([place])    biposy 07/25/2105   GERD (gastroesophageal reflux disease)    H. pylori infection    History of bleeding  ulcers 1990's   History of blood transfusion    Hypothyroidism     Past Surgical History:  Procedure Laterality Date   ANTERIOR CERVICAL CORPECTOMY N/A 07/26/2015   Procedure: Cervical five Corpectomy/Cervical four-six Fusion/Plate ;  Surgeon: Eustace Moore, MD;  Location: [place] [facility];  Service: Neurosurgery;  Laterality: N/A;  C5 Corpectomy/C4-6 Fusion/Plate C4-6   COLONOSCOPY     EYE SURGERY Right    cataracts   HERNIA REPAIR Bilateral    with mesh   IR GENERIC HISTORICAL  02/03/2017   IR REMOVAL TUN  ACCESS W/ PORT W/O FL MOD SED 02/03/2017 Markus Daft, MD [facility]   IR REMOVAL TUN ACCESS W/ PORT W/O FL MOD SED  06/28/2020   LYMPH NODE BIOPSY Right 12/11/2018   Procedure: EXCISIONAL BIOPSY OF DEEP RIGHT INGUINAL LYMPH NODE ERAS PATHWAY;  Surgeon: Fanny Skates, MD;  Location: [place];  Service: General;  Laterality: Right;    Family History  Problem Relation Age of Onset   Hypertension Mother     Social History   Socioeconomic History   Marital status: Married    Spouse name: Not on file   Number of children: Not on file   Years of education: Not on file   Highest education level: Not on file  Occupational History   Not on file  Tobacco Use   Smoking status: Former    Packs/day: 1.00    Years: 10.00    Total pack years: 10.00    Types: Cigarettes    Quit date: 12/23/1981    Years since quitting: 40.5   Smokeless tobacco: Never  Vaping Use   Vaping Use: Never used  Substance and Sexual Activity   Alcohol use: Yes    Alcohol/week: 5.0 standard drinks of alcohol    Types: 2 Glasses of wine, 3 Cans of beer per week   Drug use: No   Sexual activity: Yes  Other Topics Concern   Not on file  Social History Narrative   Not on file   Social Determinants of Health   Financial Resource Strain: Not on file  Food Insecurity: Not on file  Transportation Needs: Not on file  Physical Activity: Not on file  Stress: Not on file  Social Connections: Not on file  Intimate Partner Violence: Not on file    Review of Systems  Constitutional:  Negative for fatigue.  Respiratory:  Negative for shortness of breath.     Vitals:   07/08/22 1053  BP: 118/74  Pulse: 75  Temp: 97.8 F (36.6 C)  SpO2: 98%     Physical Exam Constitutional:      Appearance: Normal appearance.  HENT:     Head: Normocephalic.     Mouth/Throat:     Mouth: Mucous membranes are moist.  Cardiovascular:     Rate and Rhythm: Normal rate and regular rhythm.     Heart sounds: No murmur heard.     No friction rub.  Pulmonary:     Effort: No respiratory distress.     Breath sounds: No stridor. No wheezing or rhonchi.  Musculoskeletal:        General: Normal range of motion.     Cervical back: No rigidity or tenderness.  Neurological:     General: No focal deficit present.     Mental Status: He is alert.  Psychiatric:        Mood and Affect: Mood normal.     Data Reviewed: Records from the [place] reviewed  Oncology notes reviewed-Dr. Grier Mitts notes  Most recent CT from 06/11/2022 compared to previous  Assessment:  Recent COVID infection  Overall improving  Fatigue resolved  Groundglass changes continue to resolve  Past history of non-Hodgkin's lymphoma  Past chemoradiation for non-Hodgkin's lymphoma  Plan/Recommendations: Overall appears to be improving  Stable  No need to repeat CT at present  Encouraged to stay active  Follow-up in a year  Sherrilyn Rist MD [place] [facility] 07/08/2022, 11:16 AM  CC: Lawerance Cruel, MD

## 2022-07-15 ENCOUNTER — Other Ambulatory Visit: Payer: Self-pay

## 2022-07-23 ENCOUNTER — Other Ambulatory Visit: Payer: Self-pay

## 2022-08-23 ENCOUNTER — Other Ambulatory Visit: Payer: Self-pay

## 2022-10-16 DIAGNOSIS — E038 Other specified hypothyroidism: Secondary | ICD-10-CM | POA: Diagnosis not present

## 2022-10-16 DIAGNOSIS — Z125 Encounter for screening for malignant neoplasm of prostate: Secondary | ICD-10-CM | POA: Diagnosis not present

## 2022-10-16 DIAGNOSIS — E7800 Pure hypercholesterolemia, unspecified: Secondary | ICD-10-CM | POA: Diagnosis not present

## 2022-10-25 DIAGNOSIS — Z6828 Body mass index (BMI) 28.0-28.9, adult: Secondary | ICD-10-CM | POA: Diagnosis not present

## 2022-10-25 DIAGNOSIS — D696 Thrombocytopenia, unspecified: Secondary | ICD-10-CM | POA: Diagnosis not present

## 2022-10-25 DIAGNOSIS — N400 Enlarged prostate without lower urinary tract symptoms: Secondary | ICD-10-CM | POA: Diagnosis not present

## 2022-10-25 DIAGNOSIS — G8191 Hemiplegia, unspecified affecting right dominant side: Secondary | ICD-10-CM | POA: Diagnosis not present

## 2022-10-25 DIAGNOSIS — C8590 Non-Hodgkin lymphoma, unspecified, unspecified site: Secondary | ICD-10-CM | POA: Diagnosis not present

## 2022-10-25 DIAGNOSIS — E7800 Pure hypercholesterolemia, unspecified: Secondary | ICD-10-CM | POA: Diagnosis not present

## 2022-10-25 DIAGNOSIS — Z0000 Encounter for general adult medical examination without abnormal findings: Secondary | ICD-10-CM | POA: Diagnosis not present

## 2022-10-25 DIAGNOSIS — E038 Other specified hypothyroidism: Secondary | ICD-10-CM | POA: Diagnosis not present

## 2022-10-25 DIAGNOSIS — G479 Sleep disorder, unspecified: Secondary | ICD-10-CM | POA: Diagnosis not present

## 2022-10-25 DIAGNOSIS — Z79899 Other long term (current) drug therapy: Secondary | ICD-10-CM | POA: Diagnosis not present

## 2022-10-25 DIAGNOSIS — Z125 Encounter for screening for malignant neoplasm of prostate: Secondary | ICD-10-CM | POA: Diagnosis not present

## 2022-10-25 DIAGNOSIS — R69 Illness, unspecified: Secondary | ICD-10-CM | POA: Diagnosis not present

## 2022-12-25 ENCOUNTER — Inpatient Hospital Stay: Payer: No Typology Code available for payment source | Attending: Hematology | Admitting: Hematology

## 2022-12-25 ENCOUNTER — Inpatient Hospital Stay: Payer: No Typology Code available for payment source

## 2022-12-25 VITALS — BP 152/79 | HR 60 | Temp 97.3°F | Resp 17 | Wt 212.3 lb

## 2022-12-25 DIAGNOSIS — Z79899 Other long term (current) drug therapy: Secondary | ICD-10-CM | POA: Diagnosis not present

## 2022-12-25 DIAGNOSIS — C8298 Follicular lymphoma, unspecified, lymph nodes of multiple sites: Secondary | ICD-10-CM

## 2022-12-25 DIAGNOSIS — Z87891 Personal history of nicotine dependence: Secondary | ICD-10-CM | POA: Insufficient documentation

## 2022-12-25 DIAGNOSIS — Z8616 Personal history of COVID-19: Secondary | ICD-10-CM | POA: Insufficient documentation

## 2022-12-25 DIAGNOSIS — C8330 Diffuse large B-cell lymphoma, unspecified site: Secondary | ICD-10-CM

## 2022-12-25 DIAGNOSIS — M25512 Pain in left shoulder: Secondary | ICD-10-CM | POA: Diagnosis not present

## 2022-12-25 DIAGNOSIS — Z8701 Personal history of pneumonia (recurrent): Secondary | ICD-10-CM | POA: Insufficient documentation

## 2022-12-25 LAB — CBC WITH DIFFERENTIAL (CANCER CENTER ONLY)
Abs Immature Granulocytes: 0.01 10*3/uL (ref 0.00–0.07)
Basophils Absolute: 0 10*3/uL (ref 0.0–0.1)
Basophils Relative: 1 %
Eosinophils Absolute: 0.1 10*3/uL (ref 0.0–0.5)
Eosinophils Relative: 2 %
HCT: 44.6 % (ref 39.0–52.0)
Hemoglobin: 15.3 g/dL (ref 13.0–17.0)
Immature Granulocytes: 0 %
Lymphocytes Relative: 24 %
Lymphs Abs: 1 10*3/uL (ref 0.7–4.0)
MCH: 29 pg (ref 26.0–34.0)
MCHC: 34.3 g/dL (ref 30.0–36.0)
MCV: 84.5 fL (ref 80.0–100.0)
Monocytes Absolute: 0.5 10*3/uL (ref 0.1–1.0)
Monocytes Relative: 12 %
Neutro Abs: 2.4 10*3/uL (ref 1.7–7.7)
Neutrophils Relative %: 61 %
Platelet Count: 136 10*3/uL — ABNORMAL LOW (ref 150–400)
RBC: 5.28 MIL/uL (ref 4.22–5.81)
RDW: 15.9 % — ABNORMAL HIGH (ref 11.5–15.5)
WBC Count: 4 10*3/uL (ref 4.0–10.5)
nRBC: 0 % (ref 0.0–0.2)

## 2022-12-25 LAB — CMP (CANCER CENTER ONLY)
ALT: 23 U/L (ref 0–44)
AST: 25 U/L (ref 15–41)
Albumin: 4.7 g/dL (ref 3.5–5.0)
Alkaline Phosphatase: 86 U/L (ref 38–126)
Anion gap: 5 (ref 5–15)
BUN: 15 mg/dL (ref 8–23)
CO2: 30 mmol/L (ref 22–32)
Calcium: 9.7 mg/dL (ref 8.9–10.3)
Chloride: 107 mmol/L (ref 98–111)
Creatinine: 1 mg/dL (ref 0.61–1.24)
GFR, Estimated: >60 mL/min (ref >60)
Glucose, Bld: 78 mg/dL (ref 70–99)
Potassium: 4.2 mmol/L (ref 3.5–5.1)
Sodium: 142 mmol/L (ref 135–145)
Total Bilirubin: 0.6 mg/dL (ref 0.3–1.2)
Total Protein: 6.4 g/dL — ABNORMAL LOW (ref 6.5–8.1)

## 2022-12-25 LAB — LACTATE DEHYDROGENASE: LDH: 160 U/L (ref 98–192)

## 2022-12-25 NOTE — Progress Notes (Signed)
[place]   [phone] [phone]) [phone]   Hematology oncology clinic note  Patient Care Team: Lawerance Cruel, MD as PCP - General (Family Medicine)  Date of Service:  12/25/2022   CHIEF COMPLAINTS/PURPOSE OF CONSULTATION:  Continued f/u for evaluation and management of FL and DLBCL  Oncology History  DLBCL (diffuse large B cell lymphoma) ([place])  06/21/2015 Imaging   CT Chest/abd/pelvis: IMPRESSION: 5 mm nodule within the right lower lobe as described.   Pneumobilia consistent with a prior sphincterotomy.   No acute abnormality is identified. No findings to suggest chest etiology for the known lesion at C5 are seen.   06/21/2015 Imaging   MRI c spine1. Diffuse marrow replacement of the C5 vertebral body highly concerning for neoplasm (metastatic disease, myeloma, or lymphoma). Epidural tumor at this level results in moderate spinal stenosis with moderate impression on the spinal cord    07/10/2015 PET scan   1. The previously demonstrated abnormal C5 vertebral body is hypermetabolic. No other osseous lesions demonstrated. 2. There are small hypermetabolic lymph nodes within the left mesentery and both inguinal regions.    07/26/2015 Initial Biopsy   Had a C5 corpectomy pathology showed diffuse large B-cell lymphoma   08/08/2015 -  Chemotherapy   Started for cycle of R CHOP.    08/09/2015 -  Chemotherapy   Received first cycle of intrathecal methotrexate plus hydrocortisone for CNS prophylaxis.   02/05/2019 -  Chemotherapy   Patient is on Treatment Plan : NON-HODGKINS LYMPHOMA Rituximab q month        HISTORY OF PRESENTING ILLNESS: Please see previous note for details on initial presentation  INTERVAL HISTORY:  Ethan Stewart is a 77 y.o. male who is here for continued evaluation and management of follicular lymphoma and DLBCL.  He was last seen by me on 06/24/22 and was doing well overall with no medical complaints.  Patient is accompanied by  his wife during this visit and reports he is doing well overall. He complains of intermitent left shoulder pain that radiates down his hand, which started around October 2023. He notes that it is probably muscular pain. Patient reports his pain gets better with physical active, such as playing golf and with warm compresses.   Patient notes he tested positive for COVID-19 last year, which caused him Pneumonia.   He denies any new lumps/bumps, fever, chills, night sweats, decreased appetite, abnormal bowel moments, swallowing problem, back pain, neck pain, chest pain, or leg swelling.   Patient has received his influenza vaccine, COVID-19 Booster, and RSV vaccine. He is up to-date with all other vaccines.     REVIEW OF SYSTEMS: 10 Point review of Systems was done is negative except as noted above.   MEDICAL HISTORY:  Past Medical History:  Diagnosis Date   Anxiety    ARDS (adult respiratory distress syndrome) ([place])    2016 August at [facility]   Arm numbness 08/07/15   Limited movement due to tumor on spine   B-cell lymphoma ([place])    Bone cancer ([place])     tumor on C5   Cervical spine tumor 07/03/2015   Diffuse large B cell lymphoma ([place])    biposy 07/25/2105   GERD (gastroesophageal reflux disease)    H. pylori infection    History of bleeding ulcers 1990's   History of blood transfusion    Hypothyroidism     SURGICAL HISTORY: Past Surgical History:  Procedure Laterality Date   ANTERIOR CERVICAL CORPECTOMY N/A 07/26/2015  Procedure: Cervical five Corpectomy/Cervical four-six Fusion/Plate ;  Surgeon: Eustace Moore, MD;  Location: [place] [facility];  Service: Neurosurgery;  Laterality: N/A;  C5 Corpectomy/C4-6 Fusion/Plate C4-6   COLONOSCOPY     EYE SURGERY Right    cataracts   HERNIA REPAIR Bilateral    with mesh   IR GENERIC HISTORICAL  02/03/2017   IR REMOVAL TUN ACCESS W/ PORT W/O FL MOD SED 02/03/2017 Markus Daft, MD [facility]   IR REMOVAL TUN ACCESS W/ PORT W/O FL MOD SED  06/28/2020    LYMPH NODE BIOPSY Right 12/11/2018   Procedure: EXCISIONAL BIOPSY OF DEEP RIGHT INGUINAL LYMPH NODE ERAS PATHWAY;  Surgeon: Fanny Skates, MD;  Location: [place];  Service: General;  Laterality: Right;    SOCIAL HISTORY: Social History   Socioeconomic History   Marital status: Married    Spouse name: Not on file   Number of children: Not on file   Years of education: Not on file   Highest education level: Not on file  Occupational History   Not on file  Tobacco Use   Smoking status: Former    Packs/day: 1.00    Years: 10.00    Total pack years: 10.00    Types: Cigarettes    Quit date: 12/23/1981    Years since quitting: 41.0   Smokeless tobacco: Never  Vaping Use   Vaping Use: Never used  Substance and Sexual Activity   Alcohol use: Yes    Alcohol/week: 5.0 standard drinks of alcohol    Types: 2 Glasses of wine, 3 Cans of beer per week   Drug use: No   Sexual activity: Yes  Other Topics Concern   Not on file  Social History Narrative   Not on file   Social Determinants of Health   Financial Resource Strain: Not on file  Food Insecurity: Not on file  Transportation Needs: Not on file  Physical Activity: Not on file  Stress: Not on file  Social Connections: Not on file  Intimate Partner Violence: Not on file    FAMILY HISTORY: Family History  Problem Relation Age of Onset   Hypertension Mother     ALLERGIES:  is allergic to pegfilgrastim and amoxicillin.  MEDICATIONS:  Current Outpatient Medications  Medication Sig Dispense Refill   cholecalciferol (VITAMIN D3) 25 MCG (1000 UNIT) tablet 1 tablet     diazepam (VALIUM) 2 MG tablet Take 2 mg by mouth daily as needed for anxiety.     levothyroxine (SYNTHROID, LEVOTHROID) 88 MCG tablet Take 88 mcg by mouth daily before breakfast.   2   Multiple Vitamins-Minerals (CENTRUM SILVER PO) Take by mouth daily.     polyethylene glycol powder (GLYCOLAX/MIRALAX) 17 GM/SCOOP powder TAKE AS DIRECTED BY MOUTH      senna-docusate (SENOKOT-S) 8.6-50 MG tablet Take 1 tablet by mouth daily as needed for up to 15 doses for mild constipation. 15 tablet 0   simvastatin (ZOCOR) 20 MG tablet Take 20 mg by mouth daily.     tamsulosin (FLOMAX) 0.4 MG CAPS capsule Take 1 capsule (0.4 mg total) by mouth at bedtime. 30 capsule 3   temazepam (RESTORIL) 15 MG capsule Take 15 mg by mouth at bedtime as needed for sleep.      vitamin B-12 (CYANOCOBALAMIN) 500 MCG tablet Take 500 mcg by mouth daily.     No current facility-administered medications for this visit.    PHYSICAL EXAMINATION: ECOG PERFORMANCE STATUS: 0 .BP (!) 152/79 (BP Location: Left Arm, Patient Position:  Sitting)   Pulse 60   Temp (!) 97.3 F (36.3 C) (Temporal)   Resp 17   Wt 212 lb 5 oz (96.3 kg)   SpO2 100%   BMI 28.01 kg/m  NAD GENERAL:alert, in no acute distress and comfortable SKIN: no acute rashes, no significant lesions EYES: conjunctiva are pink and non-injected, sclera anicteric NECK: supple, no JVD LYMPH:  no palpable lymphadenopathy in the cervical, axillary or inguinal regions LUNGS: minimal dry rales lung basis posteriorly. HEART: regular rate & rhythm ABDOMEN:  normoactive bowel sounds , non tender, not distended. Extremity: no pedal edema PSYCH: alert & oriented x 3 with fluent speech NEURO: no focal motor/sensory deficits  LABORATORY DATA:   I have reviewed the data as listed    Latest Ref Rng & Units 12/25/2022    8:46 AM 06/24/2022    8:13 AM 03/05/2022   11:32 AM  CBC  WBC 4.0 - 10.5 K/uL 4.0  5.6  4.4   Hemoglobin 13.0 - 17.0 g/dL 15.3  11.7  13.4   Hematocrit 39.0 - 52.0 % 44.6  35.2  38.4   Platelets 150 - 400 K/uL 136  190  157        Latest Ref Rng & Units 12/25/2022    8:46 AM 06/24/2022    8:13 AM 03/05/2022   11:32 AM  CMP  Glucose 70 - 99 mg/dL 78  94  98   BUN 8 - 23 mg/dL '15  14  21   '$ Creatinine 0.61 - 1.24 mg/dL 1.00  0.91  1.12   Sodium 135 - 145 mmol/L 142  139  136   Potassium 3.5 - 5.1 mmol/L 4.2   4.2  4.0   Chloride 98 - 111 mmol/L 107  107  101   CO2 22 - 32 mmol/L '30  25  22   '$ Calcium 8.9 - 10.3 mg/dL 9.7  9.1  8.8   Total Protein 6.5 - 8.1 g/dL 6.4  6.4  6.2   Total Bilirubin 0.3 - 1.2 mg/dL 0.6  0.3  0.8   Alkaline Phos 38 - 126 U/L 86  78  64   AST 15 - 41 U/L 25  24  77   ALT 0 - 44 U/L 23  21  54     Lab Results  Component Value Date   LDH 160 12/25/2022    RADIOGRAPHIC STUDIES: I have personally reviewed the radiological images as listed and agreed with the findings in the report. No results found.  ASSESSMENT & PLAN:  ALP GOLDWATER is a 77 y.o.  male with a history of :  1 Follicular lymphoma -Previously treated with Revlimid and Rituxan, currently on Rituxan maintenance q2 months . CT chest abdomen pelvis on 06/27/2021 which showed no evidence of lymphoma recurrence on his CT chest abdomen pelvis.  2.   history of diffuse large B-cell lymphoma currently in remission Status post treatment with modified dose reduced R-CHOP  3.  History of Neulasta related ARDS  PLAN: -Discussed labs from today, 12/25/2022, with the patient and his wife. CBC shows platelets of 136 K, but overall stable. CMP stable LDH WNL @ 160 -Will continue to hold off on maintenance Rituxan and and stay in surveillance mode. -Patient has no lab, clinical  findings suggestive of recurrent/progressive lymphoma at this time. -Plan on CT neck chest abdominal and pelvis scan in 14 weeks before our next office visit.  -Patient has left shoulder pain, but does not want to  get a scan for it now. Patient will let us know if the pain worsens.   FOLLOW-UP CT neck chest abd pelvis in 14 weeks RTC with Dr Irene Limbo with labs in 16 weeks  The total time spent in the appointment was 25 minutes*.  All of the patient's questions were answered with apparent satisfaction. The patient knows to call the clinic with any problems, questions or concerns.   Sullivan Lone MD MS AAHIVMS [facility] Hematology/Oncology  Physician [facility]  .*Total Encounter Time as defined by the Centers for Medicare and Medicaid Services includes, in addition to the face-to-face time of a patient visit (documented in the note above) non-face-to-face time: obtaining and reviewing outside history, ordering and reviewing medications, tests or procedures, care coordination (communications with other health care professionals or caregivers) and documentation in the medical record.  I,Mitra Faeizi,acting as a Education administrator for Sullivan Lone, MD.,have documented all relevant documentation on the behalf of Sullivan Lone, MD,as directed by  Sullivan Lone, MD while in the presence of Sullivan Lone, MD.  .I have reviewed the above documentation for accuracy and completeness, and I agree with the above. Brunetta Genera MD

## 2022-12-27 ENCOUNTER — Other Ambulatory Visit: Payer: Self-pay

## 2022-12-27 ENCOUNTER — Encounter: Payer: Self-pay | Admitting: Hematology

## 2022-12-30 DIAGNOSIS — N400 Enlarged prostate without lower urinary tract symptoms: Secondary | ICD-10-CM | POA: Diagnosis not present

## 2022-12-30 DIAGNOSIS — Z87891 Personal history of nicotine dependence: Secondary | ICD-10-CM | POA: Diagnosis not present

## 2022-12-30 DIAGNOSIS — E039 Hypothyroidism, unspecified: Secondary | ICD-10-CM | POA: Diagnosis not present

## 2022-12-30 DIAGNOSIS — D84822 Immunodeficiency due to external causes: Secondary | ICD-10-CM | POA: Diagnosis not present

## 2022-12-30 DIAGNOSIS — Z6828 Body mass index (BMI) 28.0-28.9, adult: Secondary | ICD-10-CM | POA: Diagnosis not present

## 2022-12-30 DIAGNOSIS — E669 Obesity, unspecified: Secondary | ICD-10-CM | POA: Diagnosis not present

## 2022-12-30 DIAGNOSIS — Z8572 Personal history of non-Hodgkin lymphomas: Secondary | ICD-10-CM | POA: Diagnosis not present

## 2022-12-30 DIAGNOSIS — Z8249 Family history of ischemic heart disease and other diseases of the circulatory system: Secondary | ICD-10-CM | POA: Diagnosis not present

## 2022-12-30 DIAGNOSIS — G4700 Insomnia, unspecified: Secondary | ICD-10-CM | POA: Diagnosis not present

## 2022-12-30 DIAGNOSIS — E785 Hyperlipidemia, unspecified: Secondary | ICD-10-CM | POA: Diagnosis not present

## 2022-12-30 DIAGNOSIS — Z803 Family history of malignant neoplasm of breast: Secondary | ICD-10-CM | POA: Diagnosis not present

## 2022-12-30 DIAGNOSIS — R69 Illness, unspecified: Secondary | ICD-10-CM | POA: Diagnosis not present

## 2023-01-10 ENCOUNTER — Other Ambulatory Visit (HOSPITAL_BASED_OUTPATIENT_CLINIC_OR_DEPARTMENT_OTHER): Payer: Self-pay | Admitting: Family Medicine

## 2023-01-10 ENCOUNTER — Ambulatory Visit (HOSPITAL_BASED_OUTPATIENT_CLINIC_OR_DEPARTMENT_OTHER)
Admission: RE | Admit: 2023-01-10 | Discharge: 2023-01-10 | Disposition: A | Discharge status: Home / Self Care | Payer: No Typology Code available for payment source | Source: Ambulatory Visit | Attending: Family Medicine | Admitting: Family Medicine

## 2023-01-10 DIAGNOSIS — N50811 Right testicular pain: Secondary | ICD-10-CM

## 2023-04-02 ENCOUNTER — Encounter: Payer: Self-pay | Admitting: Hematology

## 2023-04-04 ENCOUNTER — Ambulatory Visit (HOSPITAL_COMMUNITY)
Admission: RE | Admit: 2023-04-04 | Discharge: 2023-04-04 | Disposition: A | Discharge status: Home / Self Care | Payer: No Typology Code available for payment source | Source: Ambulatory Visit | Attending: Hematology | Admitting: Hematology

## 2023-04-04 DIAGNOSIS — C8298 Follicular lymphoma, unspecified, lymph nodes of multiple sites: Secondary | ICD-10-CM | POA: Diagnosis present

## 2023-04-04 MED ORDER — IOHEXOL 9 MG/ML PO SOLN
ORAL | Status: AC
  Filled 2023-04-04: qty 1000

## 2023-04-04 MED ORDER — IOHEXOL 9 MG/ML PO SOLN
500.0000 mL | ORAL | Status: AC
  Administered 2023-04-04 (×2): 500 mL via ORAL

## 2023-04-04 MED ORDER — IOHEXOL 300 MG/ML  SOLN
100.0000 mL | Freq: Once | INTRAMUSCULAR | Status: AC | PRN
  Administered 2023-04-04: 100 mL via INTRAVENOUS

## 2023-04-04 MED ORDER — IOHEXOL 9 MG/ML PO SOLN: 500.0000 mL | ORAL | Status: AC

## 2023-04-04 MED ORDER — SODIUM CHLORIDE (PF) 0.9 % IJ SOLN
INTRAMUSCULAR | Status: AC
  Filled 2023-04-04: qty 50

## 2023-04-15 ENCOUNTER — Other Ambulatory Visit: Payer: Self-pay

## 2023-04-15 DIAGNOSIS — C8298 Follicular lymphoma, unspecified, lymph nodes of multiple sites: Secondary | ICD-10-CM

## 2023-04-16 ENCOUNTER — Inpatient Hospital Stay: Payer: No Typology Code available for payment source | Attending: Hematology

## 2023-04-16 ENCOUNTER — Telehealth: Payer: Self-pay | Admitting: Hematology

## 2023-04-16 ENCOUNTER — Inpatient Hospital Stay (HOSPITAL_BASED_OUTPATIENT_CLINIC_OR_DEPARTMENT_OTHER): Payer: No Typology Code available for payment source | Admitting: Hematology

## 2023-04-16 ENCOUNTER — Other Ambulatory Visit: Payer: Self-pay

## 2023-04-16 VITALS — BP 144/80 | HR 62 | Temp 97.7°F | Resp 18 | Wt 218.1 lb

## 2023-04-16 DIAGNOSIS — Z8572 Personal history of non-Hodgkin lymphomas: Secondary | ICD-10-CM | POA: Diagnosis present

## 2023-04-16 DIAGNOSIS — C8298 Follicular lymphoma, unspecified, lymph nodes of multiple sites: Secondary | ICD-10-CM | POA: Diagnosis not present

## 2023-04-16 DIAGNOSIS — Z87891 Personal history of nicotine dependence: Secondary | ICD-10-CM | POA: Insufficient documentation

## 2023-04-16 DIAGNOSIS — M25512 Pain in left shoulder: Secondary | ICD-10-CM | POA: Insufficient documentation

## 2023-04-16 DIAGNOSIS — C8330 Diffuse large B-cell lymphoma, unspecified site: Secondary | ICD-10-CM | POA: Diagnosis not present

## 2023-04-16 LAB — CBC WITH DIFFERENTIAL (CANCER CENTER ONLY)
Abs Immature Granulocytes: 0.01 10*3/uL (ref 0.00–0.07)
Basophils Absolute: 0 10*3/uL (ref 0.0–0.1)
Basophils Relative: 1 %
Eosinophils Absolute: 0.1 10*3/uL (ref 0.0–0.5)
Eosinophils Relative: 2 %
HCT: 42 % (ref 39.0–52.0)
Hemoglobin: 14.8 g/dL (ref 13.0–17.0)
Immature Granulocytes: 0 %
Lymphocytes Relative: 27 %
Lymphs Abs: 1.1 10*3/uL (ref 0.7–4.0)
MCH: 30.6 pg (ref 26.0–34.0)
MCHC: 35.2 g/dL (ref 30.0–36.0)
MCV: 86.8 fL (ref 80.0–100.0)
Monocytes Absolute: 0.5 10*3/uL (ref 0.1–1.0)
Monocytes Relative: 13 %
Neutro Abs: 2.3 10*3/uL (ref 1.7–7.7)
Neutrophils Relative %: 57 %
Platelet Count: 145 10*3/uL — ABNORMAL LOW (ref 150–400)
RBC: 4.84 MIL/uL (ref 4.22–5.81)
RDW: 13.3 % (ref 11.5–15.5)
WBC Count: 3.9 10*3/uL — ABNORMAL LOW (ref 4.0–10.5)
nRBC: 0 % (ref 0.0–0.2)

## 2023-04-16 LAB — CMP (CANCER CENTER ONLY)
ALT: 27 U/L (ref 0–44)
AST: 36 U/L (ref 15–41)
Albumin: 4.3 g/dL (ref 3.5–5.0)
Alkaline Phosphatase: 72 U/L (ref 38–126)
Anion gap: 5 (ref 5–15)
BUN: 19 mg/dL (ref 8–23)
CO2: 26 mmol/L (ref 22–32)
Calcium: 9.3 mg/dL (ref 8.9–10.3)
Chloride: 110 mmol/L (ref 98–111)
Creatinine: 1.06 mg/dL (ref 0.61–1.24)
GFR, Estimated: >60 mL/min (ref >60)
Glucose, Bld: 97 mg/dL (ref 70–99)
Potassium: 4.1 mmol/L (ref 3.5–5.1)
Sodium: 141 mmol/L (ref 135–145)
Total Bilirubin: 0.6 mg/dL (ref 0.3–1.2)
Total Protein: 6.2 g/dL — ABNORMAL LOW (ref 6.5–8.1)

## 2023-04-16 LAB — LACTATE DEHYDROGENASE: LDH: 171 U/L (ref 98–192)

## 2023-04-16 NOTE — Progress Notes (Signed)
[facility]   [phone] [phone]   Hematology oncology clinic note  Patient Care Team: Daisy Floro, MD as PCP - General (Family Medicine)  Date of Service:  04/16/2023   CHIEF COMPLAINTS/PURPOSE OF CONSULTATION:  Continued f/u for evaluation and management of FL and DLBCL  Oncology History  DLBCL (diffuse large B cell lymphoma)  06/21/2015 Imaging   CT Chest/abd/pelvis: IMPRESSION: 5 mm nodule within the right lower lobe as described.   Pneumobilia consistent with a prior sphincterotomy.   No acute abnormality is identified. No findings to suggest chest etiology for the known lesion at C5 are seen.   06/21/2015 Imaging   MRI c spine1. Diffuse marrow replacement of the C5 vertebral body highly concerning for neoplasm (metastatic disease, myeloma, or lymphoma). Epidural tumor at this level results in moderate spinal stenosis with moderate impression on the spinal cord    07/10/2015 PET scan   1. The previously demonstrated abnormal C5 vertebral body is hypermetabolic. No other osseous lesions demonstrated. 2. There are small hypermetabolic lymph nodes within the left mesentery and both inguinal regions.    07/26/2015 Initial Biopsy   Had a C5 corpectomy pathology showed diffuse large B-cell lymphoma   08/08/2015 -  Chemotherapy   Started for cycle of R CHOP.    08/09/2015 -  Chemotherapy   Received first cycle of intrathecal methotrexate plus hydrocortisone for CNS prophylaxis.   02/05/2019 - 12/25/2021 Chemotherapy   Patient is on Treatment Plan : NON-HODGKINS LYMPHOMA Rituximab q month        HISTORY OF PRESENTING ILLNESS: Please see previous note for details on initial presentation  INTERVAL HISTORY:  Ethan Stewart is a 77 y.o. male who is here for continued evaluation and management of follicular lymphoma and DLBCL.  He was last seen by me on 12/25/2022 and complained of intermittent left shoulder pain that radiated down his hand,  but was otherwise doing well overall with no new medical concerns.  Today, he is accompanied by his wife. He reports that he received an ultrasound 01/10/2023 due to experiencing scrotal pain after lifting a heavy object.   Patient does complain of mild shoulder pain which he believes may be attributed to arthritis. He does report difficulty breathing on 04/13/2023 but this has since resolved. He regularly plays golf and walks two miles a day.  REVIEW OF SYSTEMS:  10 Point review of Systems was done is negative except as noted above.   MEDICAL HISTORY:  Past Medical History:  Diagnosis Date   Anxiety    ARDS (adult respiratory distress syndrome) (HCC)    2016 August at [facility]   Arm numbness 08/07/15   Limited movement due to tumor on spine   B-cell lymphoma (HCC)    Bone cancer (HCC)     tumor on C5   Cervical spine tumor 07/03/2015   Diffuse large B cell lymphoma (HCC)    biposy 07/25/2105   GERD (gastroesophageal reflux disease)    H. pylori infection    History of bleeding ulcers 1990's   History of blood transfusion    Hypothyroidism     SURGICAL HISTORY: Past Surgical History:  Procedure Laterality Date   ANTERIOR CERVICAL CORPECTOMY N/A 07/26/2015   Procedure: Cervical five Corpectomy/Cervical four-six Fusion/Plate ;  Surgeon: Tia Alert, MD;  Location: [facility];  Service: Neurosurgery;  Laterality: N/A;  C5 Corpectomy/C4-6 Fusion/Plate A5-4   COLONOSCOPY     EYE SURGERY Right    cataracts  HERNIA REPAIR Bilateral    with mesh   IR GENERIC HISTORICAL  02/03/2017   IR REMOVAL TUN ACCESS W/ PORT W/O FL MOD SED 02/03/2017 Richarda Overlie, MD [facility]   IR REMOVAL TUN ACCESS W/ PORT W/O FL MOD SED  06/28/2020   LYMPH NODE BIOPSY Right 12/11/2018   Procedure: EXCISIONAL BIOPSY OF DEEP RIGHT INGUINAL LYMPH NODE ERAS PATHWAY;  Surgeon: Claud Kelp, MD;  Location: [facility] OR;  Service: General;  Laterality: Right;    SOCIAL HISTORY: Social History   Socioeconomic History    Marital status: Married    Spouse name: Not on file   Number of children: Not on file   Years of education: Not on file   Highest education level: Not on file  Occupational History   Not on file  Tobacco Use   Smoking status: Former    Packs/day: 1.00    Years: 10.00    Additional pack years: 0.00    Total pack years: 10.00    Types: Cigarettes    Quit date: 12/23/1981    Years since quitting: 41.3   Smokeless tobacco: Never  Vaping Use   Vaping Use: Never used  Substance and Sexual Activity   Alcohol use: Yes    Alcohol/week: 5.0 standard drinks of alcohol    Types: 2 Glasses of wine, 3 Cans of beer per week   Drug use: No   Sexual activity: Yes  Other Topics Concern   Not on file  Social History Narrative   Not on file   Social Determinants of Health   Financial Resource Strain: Not on file  Food Insecurity: Not on file  Transportation Needs: Not on file  Physical Activity: Not on file  Stress: Not on file  Social Connections: Not on file  Intimate Partner Violence: Not on file    FAMILY HISTORY: Family History  Problem Relation Age of Onset   Hypertension Mother     ALLERGIES:  is allergic to pegfilgrastim and amoxicillin.  MEDICATIONS:  Current Outpatient Medications  Medication Sig Dispense Refill   cholecalciferol (VITAMIN D3) 25 MCG (1000 UNIT) tablet 1 tablet     diazepam (VALIUM) 2 MG tablet Take 2 mg by mouth daily as needed for anxiety.     levothyroxine (SYNTHROID, LEVOTHROID) 88 MCG tablet Take 88 mcg by mouth daily before breakfast.   2   Multiple Vitamins-Minerals (CENTRUM SILVER PO) Take by mouth daily.     polyethylene glycol powder (GLYCOLAX/MIRALAX) 17 GM/SCOOP powder TAKE AS DIRECTED BY MOUTH     senna-docusate (SENOKOT-S) 8.6-50 MG tablet Take 1 tablet by mouth daily as needed for up to 15 doses for mild constipation. 15 tablet 0   simvastatin (ZOCOR) 20 MG tablet Take 20 mg by mouth daily.     tamsulosin (FLOMAX) 0.4 MG CAPS capsule Take  1 capsule (0.4 mg total) by mouth at bedtime. 30 capsule 3   temazepam (RESTORIL) 15 MG capsule Take 15 mg by mouth at bedtime as needed for sleep.      vitamin B-12 (CYANOCOBALAMIN) 500 MCG tablet Take 500 mcg by mouth daily.     No current facility-administered medications for this visit.    PHYSICAL EXAMINATION: ECOG PERFORMANCE STATUS: 0 .BP (!) 144/80   Pulse 62   Temp 97.7 F (36.5 C)   Resp 18   Wt 218 lb 1.6 oz (98.9 kg)   SpO2 98%   BMI 28.77 kg/m   GENERAL:alert, in no acute distress and comfortable SKIN: no  acute rashes, no significant lesions EYES: conjunctiva are pink and non-injected, sclera anicteric OROPHARYNX: MMM, no exudates, no oropharyngeal erythema or ulceration NECK: supple, no JVD LYMPH:  no palpable lymphadenopathy in the cervical, axillary or inguinal regions LUNGS: clear to auscultation b/l with normal respiratory effort HEART: regular rate & rhythm ABDOMEN:  normoactive bowel sounds , non tender, not distended. Extremity: no pedal edema PSYCH: alert & oriented x 3 with fluent speech NEURO: no focal motor/sensory deficits   LABORATORY DATA:   I have reviewed the data as listed    Latest Ref Rng & Units 04/16/2023    8:10 AM 12/25/2022    8:46 AM 06/24/2022    8:13 AM  CBC  WBC 4.0 - 10.5 K/uL 3.9  4.0  5.6   Hemoglobin 13.0 - 17.0 g/dL 16.1  09.6  04.5   Hematocrit 39.0 - 52.0 % 42.0  44.6  35.2   Platelets 150 - 400 K/uL 145  136  190        Latest Ref Rng & Units 04/16/2023    8:10 AM 12/25/2022    8:46 AM 06/24/2022    8:13 AM  CMP  Glucose 70 - 99 mg/dL 97  78  94   BUN 8 - 23 mg/dL 19  15  14    Creatinine 0.61 - 1.24 mg/dL 4.09  8.11  9.14   Sodium 135 - 145 mmol/L 141  142  139   Potassium 3.5 - 5.1 mmol/L 4.1  4.2  4.2   Chloride 98 - 111 mmol/L 110  107  107   CO2 22 - 32 mmol/L 26  30  25    Calcium 8.9 - 10.3 mg/dL 9.3  9.7  9.1   Total Protein 6.5 - 8.1 g/dL 6.2  6.4  6.4   Total Bilirubin 0.3 - 1.2 mg/dL 0.6  0.6  0.3    Alkaline Phos 38 - 126 U/L 72  86  78   AST 15 - 41 U/L 36  25  24   ALT 0 - 44 U/L 27  23  21      Lab Results  Component Value Date   LDH 160 12/25/2022    RADIOGRAPHIC STUDIES: I have personally reviewed the radiological images as listed and agreed with the findings in the report. CT Soft Tissue Neck W Contrast  Result Date: 04/08/2023 CLINICAL DATA:  Hematologic malignancy.  Follicular lymphoma. EXAM: CT NECK WITH CONTRAST TECHNIQUE: Multidetector CT imaging of the neck was performed using the standard protocol following the bolus administration of intravenous contrast. RADIATION DOSE REDUCTION: This exam was performed according to the departmental dose-optimization program which includes automated exposure control, adjustment of the mA and/or kV according to patient size and/or use of iterative reconstruction technique. CONTRAST:  OMNIPAQUE IOHEXOL 300 MG/ML  SOLN COMPARISON:  None Available. FINDINGS: Pharynx and larynx: No focal mucosal or submucosal lesions are present. The nasopharynx is clear. The soft palate and tongue base are within normal limits. Vallecula and epiglottis are within normal limits. Aryepiglottic folds and piriform sinuses are clear. Vocal cords are midline and symmetric. Trachea is clear. Salivary glands: The submandibular and parotid glands and ducts are within normal limits. Thyroid: Normal Lymph nodes: No significant adenopathy is present to suggest recurrent disease. Vascular: Minimal atherosclerotic calcifications are present at the left carotid bifurcation. No significant stenosis is present. Calcifications are also present at the aortic arch including the origin of the left common carotid artery without focal stenosis or aneurysm. Limited  intracranial: Within normal limits Visualized orbits: Not imaged Mastoids and visualized paranasal sinuses: The paranasal sinuses and mastoid air cells are clear. Skeleton: C5 corpectomy and fusion noted. Hardware is intact.  Alignment is anatomic. Rightward curvature is present in the lower cervical spine. Leftward curvature is present the upper thoracic spine. No focal osseous lesions are present. Upper chest: The lung apices are clear. The thoracic inlet is within normal limits. IMPRESSION: 1. No significant adenopathy to suggest recurrent disease. 2. C5 corpectomy and fusion. 3. Scoliosis of the cervical and upper thoracic spine. 4.  Aortic Atherosclerosis (ICD10-I70.0). Electronically Signed   By: Marin Roberts M.D.   On: 04/08/2023 18:58   CT CHEST ABDOMEN PELVIS W CONTRAST  Result Date: 04/07/2023 CLINICAL DATA:  Follicular lymphoma.  * Tracking Code: BO * EXAM: CT CHEST, ABDOMEN, AND PELVIS WITH CONTRAST TECHNIQUE: Multidetector CT imaging of the chest, abdomen and pelvis was performed following the standard protocol during bolus administration of intravenous contrast. RADIATION DOSE REDUCTION: This exam was performed according to the departmental dose-optimization program which includes automated exposure control, adjustment of the mA and/or kV according to patient size and/or use of iterative reconstruction technique. CONTRAST:  OMNIPAQUE IOHEXOL 300 MG/ML  SOLN COMPARISON:  Chest CT 06/11/2022 and older. Chest abdomen pelvis CT 02/08/2022. PET-CT scan 03/12/2016. FINDINGS: CT CHEST FINDINGS Cardiovascular: Heart is slightly enlarged. Coronary artery calcifications are seen. No pericardial effusion. The thoracic aorta has a normal course and caliber with some scattered vascular calcifications. Normal variant of the left vertebral artery originating directly from the aortic arch just proximal to the left subclavian artery origin. Mediastinum/Nodes: Small thyroid gland, unchanged from previous. Normal caliber thoracic esophagus with small hiatal hernia. No specific abnormal lymph node enlargement identified in the axillary region, hilum or mediastinum. Please see separate dictation of neck CT. Lungs/Pleura: No  pneumothorax or effusion. There continues to be some bandlike changes along the lower lobes dependently with some ground-glass. No consolidation. Apical pleural thickening bilaterally. There is a stable nodule in the medial aspect of the right upper lobe anteriorly on image 89 of series 5 measuring 3 mm. Similar nodule superior segment right lower lobe on series 5, image 63. 5 mm nodule lateral right lower lobe series 5, image 101 also stable. This has been stable since at least 2017 demonstrating long-term stability. No additional imaging follow-up. Musculoskeletal: Degenerative changes seen along the spine with bridging osteophytes. CT ABDOMEN PELVIS FINDINGS Hepatobiliary: No focal liver abnormality is seen. No gallstones, gallbladder wall thickening, or biliary dilatation. Pancreas: Moderate atrophy of the pancreas unchanged from previous. No mass lesion. Spleen: Normal in size without focal abnormality. Adrenals/Urinary Tract: The adrenal glands are preserved. Parapelvic left-sided renal cysts. No specific follow-up. Left-sided volume loss. No enhancing mass or collecting system dilatation. Preserved contours of the urinary bladder. Stomach/Bowel: Scattered colonic stool. Few colonic diverticula. No obstruction, free air or free fluid. Stomach and small bowel are nondilated. Normal appendix Vascular/Lymphatic: Normal caliber aorta and IVC. Retroaortic left renal vein. No discrete lymph node enlargement identified in the abdomen and pelvis. There are some small nodes identified along the retroperitoneum which are ill-defined but unchanged from previous. Reproductive: Enlarged prostate. Please correlate with patient's PSA. Other: Surgical clips along the right inguinal region. Presumed hernia mesh in the low pelvis. Musculoskeletal: Curvature of the spine. Multilevel degenerative changes of the spine and pelvis. IMPRESSION: No developing new mass lesion, fluid collection or lymph node enlargement. No  splenomegaly. Persistent atelectasis or scar like changes along the  lung bases. Enlarged prostate. Electronically Signed   By: Karen Kays M.D.   On: 04/07/2023 18:25    ASSESSMENT & PLAN:  Ethan Stewart is a 77 y.o.  male with a history of :  1 Follicular lymphoma -Previously treated with Revlimid and Rituxan, currently on Rituxan maintenance q2 months . CT chest abdomen pelvis on 06/27/2021 which showed no evidence of lymphoma recurrence on his CT chest abdomen pelvis.  2.   history of diffuse large B-cell lymphoma currently in remission Status post treatment with modified dose reduced R-CHOP  3.  History of Neulasta related ARDS  PLAN:   -Discussed lab results on 04/16/2023 in detail with patient. CBC showed WBC of 3.9K, hemoglobin of 14.8, and platelets of 145K. -CMP normal -LDH wnl at 171 -CT neck showed No significant adenopathy to suggest recurrent disease, normal C5 surgical changes -CT chest/abdm/pelvis showed no signs of lymphoma -Will continue to hold off on maintenance Rituxan and and stay in surveillance mode. -Patient has no lab, clinical  findings suggestive of recurrent/progressive lymphoma at this time. -answered all of patient's questions regarding treatment options such as R-CHOP in detail -Patient shall RTC in 6 months  FOLLOW-UP Return to clinic with Dr. Candise Che with labs in 6 months  The total time spent in the appointment was 20 minutes* .  All of the patient's questions were answered with apparent satisfaction. The patient knows to call the clinic with any problems, questions or concerns.   Wyvonnia Lora MD MS AAHIVMS [facility] [employer] [facility] Hematology/Oncology Physician [facility]  .*Total Encounter Time as defined by the Centers for Medicare and Medicaid Services includes, in addition to the face-to-face time of a patient visit (documented in the note above) non-face-to-face time: obtaining and reviewing outside history, ordering and reviewing medications,  tests or procedures, care coordination (communications with other health care professionals or caregivers) and documentation in the medical record.    I,Mitra Faeizi,acting as a Neurosurgeon for Wyvonnia Lora, MD.,have documented all relevant documentation on the behalf of Wyvonnia Lora, MD,as directed by  Wyvonnia Lora, MD while in the presence of Wyvonnia Lora, MD.  .I have reviewed the above documentation for accuracy and completeness, and I agree with the above. Johney Maine MD

## 2023-04-22 ENCOUNTER — Encounter: Payer: Self-pay | Admitting: Hematology

## 2023-04-30 DIAGNOSIS — Z6829 Body mass index (BMI) 29.0-29.9, adult: Secondary | ICD-10-CM | POA: Diagnosis not present

## 2023-04-30 DIAGNOSIS — G479 Sleep disorder, unspecified: Secondary | ICD-10-CM | POA: Diagnosis not present

## 2023-04-30 DIAGNOSIS — L814 Other melanin hyperpigmentation: Secondary | ICD-10-CM | POA: Diagnosis not present

## 2023-04-30 DIAGNOSIS — Z85828 Personal history of other malignant neoplasm of skin: Secondary | ICD-10-CM | POA: Diagnosis not present

## 2023-04-30 DIAGNOSIS — F411 Generalized anxiety disorder: Secondary | ICD-10-CM | POA: Diagnosis not present

## 2023-04-30 DIAGNOSIS — L821 Other seborrheic keratosis: Secondary | ICD-10-CM | POA: Diagnosis not present

## 2023-04-30 DIAGNOSIS — L570 Actinic keratosis: Secondary | ICD-10-CM | POA: Diagnosis not present

## 2023-04-30 DIAGNOSIS — D692 Other nonthrombocytopenic purpura: Secondary | ICD-10-CM | POA: Diagnosis not present

## 2023-04-30 DIAGNOSIS — D225 Melanocytic nevi of trunk: Secondary | ICD-10-CM | POA: Diagnosis not present

## 2023-06-16 DIAGNOSIS — Z0000 Encounter for general adult medical examination without abnormal findings: Secondary | ICD-10-CM | POA: Diagnosis not present

## 2023-06-16 DIAGNOSIS — U071 COVID-19: Secondary | ICD-10-CM | POA: Diagnosis not present

## 2023-07-10 ENCOUNTER — Telehealth: Payer: Self-pay | Admitting: Pulmonary Disease

## 2023-07-10 NOTE — Telephone Encounter (Signed)
FYI, patient will continue pulmonary care through the [facility] pulmonary clinic. Authorization for our office was denied.

## 2023-09-08 ENCOUNTER — Encounter: Payer: Self-pay | Admitting: Pulmonary Disease

## 2023-09-08 ENCOUNTER — Ambulatory Visit (INDEPENDENT_AMBULATORY_CARE_PROVIDER_SITE_OTHER): Payer: No Typology Code available for payment source | Admitting: Pulmonary Disease

## 2023-09-08 VITALS — BP 140/76 | HR 65 | Ht 73.0 in | Wt 218.8 lb

## 2023-09-08 DIAGNOSIS — R9389 Abnormal findings on diagnostic imaging of other specified body structures: Secondary | ICD-10-CM

## 2023-09-08 NOTE — Progress Notes (Signed)
Ethan Stewart    [id number]    08-Feb-1946  Primary Care Physician:Ross, Darlen Round, MD  Referring Physician: Alver Fisher, MD [address] [place],  [place] [address]  Chief complaint:   Weakness, shortness of breath  HPI:  Symptoms are stable  Exercises regularly walking up to 3 miles a day  No weight loss  He had recently visited with one of his doctors who had rales at the bases of the lungs and was concerned  His appetite is good  He had a recent CT scan of the chest in April 2024 that was comparable to one performed in 2023 with improvement in groundglass changes on the previous CT  Overall continues to feel well  Does have an occasional cough  History significant for history of non-Hodgkin's lymphoma in 2016 with a recurrence in 2019 Has been through chemo and radiation and also  During his treatment in 2016 for non-Hodgkin's lymphoma, did suffer ARDS, was on the ventilator for a while and required rehab-recovered fully  Past history of smoking-quit in 1993  No significant history of previous lung disease  Not usually on any inhalers or significant shortness of breath with activities  Other medical problems include history of GERD, history of depression/anxiety, history of hypothyroidism His oncology diagnoses include diffuse B-cell lymphoma stage IV AE with extranodal involvement of C5 vertebra s/p C5 corpectomy G-CSF related capillary leak syndrome and ARDS, left neck basal cell carcinoma -Had 6 cycles of R CHOP Continues to follow with oncology  Outpatient Encounter Medications as of 09/08/2023  Medication Sig   cholecalciferol (VITAMIN D3) 25 MCG (1000 UNIT) tablet 1 tablet   diazepam (VALIUM) 2 MG tablet Take 2 mg by mouth daily as needed for anxiety.   levothyroxine (SYNTHROID, LEVOTHROID) 88 MCG tablet Take 88 mcg by mouth daily before breakfast.    Multiple Vitamins-Minerals (CENTRUM SILVER PO) Take by mouth daily.   polyethylene  glycol powder (GLYCOLAX/MIRALAX) 17 GM/SCOOP powder TAKE AS DIRECTED BY MOUTH   simvastatin (ZOCOR) 20 MG tablet Take 20 mg by mouth daily.   tamsulosin (FLOMAX) 0.4 MG CAPS capsule Take 1 capsule (0.4 mg total) by mouth at bedtime.   temazepam (RESTORIL) 15 MG capsule Take 15 mg by mouth at bedtime as needed for sleep.    vitamin B-12 (CYANOCOBALAMIN) 500 MCG tablet Take 500 mcg by mouth daily.   [DISCONTINUED] senna-docusate (SENOKOT-S) 8.6-50 MG tablet Take 1 tablet by mouth daily as needed for up to 15 doses for mild constipation.   No facility-administered encounter medications on file as of 09/08/2023.    Allergies as of 09/08/2023 - Review Complete 09/08/2023  Allergen Reaction Noted   Pegfilgrastim Other (See Comments) 09/02/2015   Amoxicillin Other (See Comments) 02/25/2022    Past Medical History:  Diagnosis Date   Anxiety    ARDS (adult respiratory distress syndrome) (HCC)    2016 August at [facility]   Arm numbness 08/07/15   Limited movement due to tumor on spine   B-cell lymphoma (HCC)    Bone cancer (HCC)     tumor on C5   Cervical spine tumor 07/03/2015   Diffuse large B cell lymphoma (HCC)    biposy 07/25/2105   GERD (gastroesophageal reflux disease)    H. pylori infection    History of bleeding ulcers 1990's   History of blood transfusion    Hypothyroidism     Past Surgical History:  Procedure Laterality Date   ANTERIOR  CERVICAL CORPECTOMY N/A 07/26/2015   Procedure: Cervical five Corpectomy/Cervical four-six Fusion/Plate ;  Surgeon: Tia Alert, MD;  Location: [facility];  Service: Neurosurgery;  Laterality: N/A;  C5 Corpectomy/C4-6 Fusion/Plate F6-2   COLONOSCOPY     EYE SURGERY Right    cataracts   HERNIA REPAIR Bilateral    with mesh   IR GENERIC HISTORICAL  02/03/2017   IR REMOVAL TUN ACCESS W/ PORT W/O FL MOD SED 02/03/2017 Richarda Overlie, MD [facility]   IR REMOVAL TUN ACCESS W/ PORT W/O FL MOD SED  06/28/2020   LYMPH NODE BIOPSY Right 12/11/2018    Procedure: EXCISIONAL BIOPSY OF DEEP RIGHT INGUINAL LYMPH NODE ERAS PATHWAY;  Surgeon: Claud Kelp, MD;  Location: [facility] OR;  Service: General;  Laterality: Right;    Family History  Problem Relation Age of Onset   Hypertension Mother     Social History   Socioeconomic History   Marital status: Married    Spouse name: Not on file   Number of children: Not on file   Years of education: Not on file   Highest education level: Not on file  Occupational History   Not on file  Tobacco Use   Smoking status: Former    Current packs/day: 0.00    Average packs/day: 1 pack/day for 10.0 years (10.0 ttl pk-yrs)    Types: Cigarettes    Start date: 12/24/1971    Quit date: 12/23/1981    Years since quitting: 41.7   Smokeless tobacco: Never  Vaping Use   Vaping status: Never Used  Substance and Sexual Activity   Alcohol use: Yes    Alcohol/week: 5.0 standard drinks of alcohol    Types: 2 Glasses of wine, 3 Cans of beer per week   Drug use: No   Sexual activity: Yes  Other Topics Concern   Not on file  Social History Narrative   Not on file   Social Determinants of Health   Financial Resource Strain: Not on file  Food Insecurity: Not on file  Transportation Needs: Not on file  Physical Activity: Not on file  Stress: Not on file  Social Connections: Not on file  Intimate Partner Violence: Not on file    Review of Systems  Constitutional:  Negative for fatigue.  Respiratory:  Negative for shortness of breath.     Vitals:   09/08/23 1305  BP: (!) 140/76  Pulse: 65  SpO2: 98%     Physical Exam Constitutional:      Appearance: Normal appearance.  HENT:     Head: Normocephalic.     Mouth/Throat:     Mouth: Mucous membranes are moist.  Eyes:     General: No scleral icterus. Cardiovascular:     Rate and Rhythm: Normal rate and regular rhythm.     Heart sounds: No murmur heard.    No friction rub.  Pulmonary:     Effort: No respiratory distress.     Breath sounds: No  stridor. Rales present. No wheezing or rhonchi.  Musculoskeletal:        General: Normal range of motion.     Cervical back: No rigidity or tenderness.  Neurological:     General: No focal deficit present.     Mental Status: He is alert.  Psychiatric:        Mood and Affect: Mood normal.     Data Reviewed: CT scan from 2024 and 2023 reviewed, compared with previous  Oncology notes reviewed-Dr. Clyda Greener notes  Assessment:  Past COVID infection  Scarring at the bases of the lungs -Have stayed stable  Fatigue is improved  Occasional cough is better  History of Hodgkin's lymphoma for which he follows up with oncology  He does get yearly CT scans which I will follow-up on  Past chemoradiation for non-Hodgkin's lymphoma  Plan/Recommendations: He continues to do well  No changes  Will follow-up on his next CT scan  Encouraged to call with significant concerns  Encouraged to stay active  Follow-up a year from now   Virl Diamond MD [facility] 09/08/2023, 1:11 PM  CC: Briones, Leisa Lenz, MD

## 2023-09-08 NOTE — Patient Instructions (Signed)
I will see you a year from now  No changes to your treatment  I did review your CAT scans from April 2024, comparing with April 2023, there is some scarring at the base which may be as a result of you having COVID in the past, some people may have a form of pneumonia It looks stable  Continue staying active  Call us if any changes in symptoms

## 2023-10-14 ENCOUNTER — Other Ambulatory Visit: Payer: Self-pay

## 2023-10-14 DIAGNOSIS — C8298 Follicular lymphoma, unspecified, lymph nodes of multiple sites: Secondary | ICD-10-CM

## 2023-10-15 ENCOUNTER — Inpatient Hospital Stay: Payer: No Typology Code available for payment source | Attending: Hematology

## 2023-10-15 ENCOUNTER — Inpatient Hospital Stay (HOSPITAL_BASED_OUTPATIENT_CLINIC_OR_DEPARTMENT_OTHER): Payer: No Typology Code available for payment source | Admitting: Hematology

## 2023-10-15 ENCOUNTER — Other Ambulatory Visit: Payer: Self-pay

## 2023-10-15 DIAGNOSIS — D696 Thrombocytopenia, unspecified: Secondary | ICD-10-CM | POA: Diagnosis not present

## 2023-10-15 DIAGNOSIS — C8298 Follicular lymphoma, unspecified, lymph nodes of multiple sites: Secondary | ICD-10-CM

## 2023-10-15 DIAGNOSIS — C8330 Diffuse large B-cell lymphoma, unspecified site: Secondary | ICD-10-CM

## 2023-10-15 DIAGNOSIS — Z87891 Personal history of nicotine dependence: Secondary | ICD-10-CM | POA: Insufficient documentation

## 2023-10-15 DIAGNOSIS — F1090 Alcohol use, unspecified, uncomplicated: Secondary | ICD-10-CM | POA: Insufficient documentation

## 2023-10-15 DIAGNOSIS — Z8572 Personal history of non-Hodgkin lymphomas: Secondary | ICD-10-CM | POA: Insufficient documentation

## 2023-10-15 LAB — CMP (CANCER CENTER ONLY)
ALT: 13 U/L (ref 0–44)
AST: 24 U/L (ref 15–41)
Albumin: 4.3 g/dL (ref 3.5–5.0)
Alkaline Phosphatase: 74 U/L (ref 38–126)
Anion gap: 5 (ref 5–15)
BUN: 21 mg/dL (ref 8–23)
CO2: 28 mmol/L (ref 22–32)
Calcium: 9.3 mg/dL (ref 8.9–10.3)
Chloride: 108 mmol/L (ref 98–111)
Creatinine: 1.13 mg/dL (ref 0.61–1.24)
GFR, Estimated: >60 mL/min (ref >60)
Glucose, Bld: 91 mg/dL (ref 70–99)
Potassium: 4.3 mmol/L (ref 3.5–5.1)
Sodium: 141 mmol/L (ref 135–145)
Total Bilirubin: 0.7 mg/dL (ref 0.3–1.2)
Total Protein: 6.2 g/dL — ABNORMAL LOW (ref 6.5–8.1)

## 2023-10-15 LAB — CBC WITH DIFFERENTIAL (CANCER CENTER ONLY)
Abs Immature Granulocytes: 0.01 10*3/uL (ref 0.00–0.07)
Basophils Absolute: 0 10*3/uL (ref 0.0–0.1)
Basophils Relative: 1 %
Eosinophils Absolute: 0.1 10*3/uL (ref 0.0–0.5)
Eosinophils Relative: 2 %
HCT: 43.3 % (ref 39.0–52.0)
Hemoglobin: 14.8 g/dL (ref 13.0–17.0)
Immature Granulocytes: 0 %
Lymphocytes Relative: 19 %
Lymphs Abs: 1.2 10*3/uL (ref 0.7–4.0)
MCH: 30.3 pg (ref 26.0–34.0)
MCHC: 34.2 g/dL (ref 30.0–36.0)
MCV: 88.5 fL (ref 80.0–100.0)
Monocytes Absolute: 0.7 10*3/uL (ref 0.1–1.0)
Monocytes Relative: 10 %
Neutro Abs: 4.5 10*3/uL (ref 1.7–7.7)
Neutrophils Relative %: 68 %
Platelet Count: 142 10*3/uL — ABNORMAL LOW (ref 150–400)
RBC: 4.89 MIL/uL (ref 4.22–5.81)
RDW: 13.4 % (ref 11.5–15.5)
WBC Count: 6.5 10*3/uL (ref 4.0–10.5)
nRBC: 0 % (ref 0.0–0.2)

## 2023-10-15 LAB — LACTATE DEHYDROGENASE: LDH: 190 U/L (ref 98–192)

## 2023-10-15 NOTE — Progress Notes (Signed)
[facility]   Telephone:(336) [phone] Fax:(336) [phone]   Hematology oncology clinic note  Patient Care Team: Daisy Floro, MD as PCP - General (Family Medicine)  Date of Service:  10/15/2023   CHIEF COMPLAINTS/PURPOSE OF CONSULTATION:  Continued f/u for evaluation and management of FL and DLBCL  Oncology History  DLBCL (diffuse large B cell lymphoma) (HCC)  06/21/2015 Imaging   CT Chest/abd/pelvis: IMPRESSION: 5 mm nodule within the right lower lobe as described.   Pneumobilia consistent with a prior sphincterotomy.   No acute abnormality is identified. No findings to suggest chest etiology for the known lesion at C5 are seen.   06/21/2015 Imaging   MRI c spine1. Diffuse marrow replacement of the C5 vertebral body highly concerning for neoplasm (metastatic disease, myeloma, or lymphoma). Epidural tumor at this level results in moderate spinal stenosis with moderate impression on the spinal cord    07/10/2015 PET scan   1. The previously demonstrated abnormal C5 vertebral body is hypermetabolic. No other osseous lesions demonstrated. 2. There are small hypermetabolic lymph nodes within the left mesentery and both inguinal regions.    07/26/2015 Initial Biopsy   Had a C5 corpectomy pathology showed diffuse large B-cell lymphoma   08/08/2015 -  Chemotherapy   Started for cycle of R CHOP.    08/09/2015 -  Chemotherapy   Received first cycle of intrathecal methotrexate plus hydrocortisone for CNS prophylaxis.   02/05/2019 - 12/25/2021 Chemotherapy   Patient is on Treatment Plan : NON-HODGKINS LYMPHOMA Rituximab q month        HISTORY OF PRESENTING ILLNESS: Please see previous note for details on initial presentation  INTERVAL HISTORY:  Ethan Stewart is a 77 y.o. male who is here for continued evaluation and management of follicular lymphoma and DLBCL.  He was last seen by me on 04/16/2023 and complained of mild shoulder pain attributed to arthritis and  breathing issues which had resolved.   Today, he is accompanied by his wife. Patient reports that he noticed a visible knot under the skin in the area of his swollen groin lymph node 6 weeks ago. He reports that the knot is not changing. He reports that it is possible that he may have had an insect bite, though he notes that his symptoms is different from having a previous chigger bite. Patient denies any other new lumps/bumps in any other areas.   Patient denies any back pain, abdominal pain, fever, chills. Night sweats, new skin rashes, leg swelling, change in breathing habits, or change in bowel/urination habits.   He reports that he was recently seen by pulmonology for follow-up following his pneumonia infection 1 year ago. Chest x-ray reportedly did show some cloudiness. Patient reports that Dr. Wynona Neat did not feel that results were concerning. Patient was told that there were plans to have a scope with pulmonologist for further evaluation. He has not had any breathing symptoms.   Patient reports that during the time of his pneumonia infection 1 year ago, he endorsed a 20-pound weight-loss, lack of appetite, lack of strength for 1.5 months. He has normal energy levels and eating habits at this time. Patient continues to play golf regularly.   Patient is UTD with the newest COVID-19 and flu shot. He did receive the RSV vaccine last year.   His wife was diagnosed with breast cancer in April.   REVIEW OF SYSTEMS:  10 Point review of Systems was done is negative except as noted above.   MEDICAL HISTORY:  Past Medical History:  Diagnosis Date   Anxiety    ARDS (adult respiratory distress syndrome) (HCC)    2016 August at [facility]   Arm numbness 08/07/15   Limited movement due to tumor on spine   B-cell lymphoma (HCC)    Bone cancer (HCC)     tumor on C5   Cervical spine tumor 07/03/2015   Diffuse large B cell lymphoma (HCC)    biposy 07/25/2105   GERD (gastroesophageal reflux disease)    H.  pylori infection    History of bleeding ulcers 1990's   History of blood transfusion    Hypothyroidism     SURGICAL HISTORY: Past Surgical History:  Procedure Laterality Date   ANTERIOR CERVICAL CORPECTOMY N/A 07/26/2015   Procedure: Cervical five Corpectomy/Cervical four-six Fusion/Plate ;  Surgeon: Tia Alert, MD;  Location: [facility];  Service: Neurosurgery;  Laterality: N/A;  C5 Corpectomy/C4-6 Fusion/Plate Z6-1   COLONOSCOPY     EYE SURGERY Right    cataracts   HERNIA REPAIR Bilateral    with mesh   IR GENERIC HISTORICAL  02/03/2017   IR REMOVAL TUN ACCESS W/ PORT W/O FL MOD SED 02/03/2017 Richarda Overlie, MD [facility]INTER[facility] RAD   IR REMOVAL TUN ACCESS W/ PORT W/O FL MOD SED  06/28/2020   LYMPH NODE BIOPSY Right 12/11/2018   Procedure: EXCISIONAL BIOPSY OF DEEP RIGHT INGUINAL LYMPH NODE ERAS PATHWAY;  Surgeon: Claud Kelp, MD;  Location: [facility] OR;  Service: General;  Laterality: Right;    SOCIAL HISTORY: Social History   Socioeconomic History   Marital status: Married    Spouse name: Not on file   Number of children: Not on file   Years of education: Not on file   Highest education level: Not on file  Occupational History   Not on file  Tobacco Use   Smoking status: Former    Current packs/day: 0.00    Average packs/day: 1 pack/day for 10.0 years (10.0 ttl pk-yrs)    Types: Cigarettes    Start date: 12/24/1971    Quit date: 12/23/1981    Years since quitting: 41.8   Smokeless tobacco: Never  Vaping Use   Vaping status: Never Used  Substance and Sexual Activity   Alcohol use: Yes    Alcohol/week: 5.0 standard drinks of alcohol    Types: 2 Glasses of wine, 3 Cans of beer per week   Drug use: No   Sexual activity: Yes  Other Topics Concern   Not on file  Social History Narrative   Not on file   Social Determinants of Health   Financial Resource Strain: Not on file  Food Insecurity: Not on file  Transportation Needs: Not on file  Physical Activity: Not on file  Stress:  Not on file  Social Connections: Not on file  Intimate Partner Violence: Not on file    FAMILY HISTORY: Family History  Problem Relation Age of Onset   Hypertension Mother     ALLERGIES:  is allergic to pegfilgrastim and amoxicillin.  MEDICATIONS:  Current Outpatient Medications  Medication Sig Dispense Refill   cholecalciferol (VITAMIN D3) 25 MCG (1000 UNIT) tablet 1 tablet     diazepam (VALIUM) 2 MG tablet Take 2 mg by mouth daily as needed for anxiety.     levothyroxine (SYNTHROID, LEVOTHROID) 88 MCG tablet Take 88 mcg by mouth daily before breakfast.   2   Multiple Vitamins-Minerals (CENTRUM SILVER PO) Take by mouth daily.     polyethylene glycol powder (GLYCOLAX/MIRALAX)  17 GM/SCOOP powder TAKE AS DIRECTED BY MOUTH     simvastatin (ZOCOR) 20 MG tablet Take 20 mg by mouth daily.     tamsulosin (FLOMAX) 0.4 MG CAPS capsule Take 1 capsule (0.4 mg total) by mouth at bedtime. 30 capsule 3   temazepam (RESTORIL) 15 MG capsule Take 15 mg by mouth at bedtime as needed for sleep.      vitamin B-12 (CYANOCOBALAMIN) 500 MCG tablet Take 500 mcg by mouth daily.     No current facility-administered medications for this visit.    PHYSICAL EXAMINATION: ECOG PERFORMANCE STATUS: 0 VSS GENERAL:alert, in no acute distress and comfortable SKIN: no acute rashes, no significant lesions EYES: conjunctiva are pink and non-injected, sclera anicteric OROPHARYNX: MMM, no exudates, no oropharyngeal erythema or ulceration NECK: supple, no JVD LYMPH:  no palpable lymphadenopathy in the cervical, axillary or inguinal regions LUNGS: clear to auscultation b/l with normal respiratory effort HEART: regular rate & rhythm ABDOMEN:  normoactive bowel sounds , non tender, not distended. Extremity: no pedal edema PSYCH: alert & oriented x 3 with fluent speech NEURO: no focal motor/sensory deficits   LABORATORY DATA:   I have reviewed the data as listed    Latest Ref Rng & Units 10/15/2023    8:25 AM  04/16/2023    8:10 AM 12/25/2022    8:46 AM  CBC  WBC 4.0 - 10.5 K/uL 6.5  3.9  4.0   Hemoglobin 13.0 - 17.0 g/dL 16.1  09.6  04.5   Hematocrit 39.0 - 52.0 % 43.3  42.0  44.6   Platelets 150 - 400 K/uL 142  145  136        Latest Ref Rng & Units 10/15/2023    8:25 AM 04/16/2023    8:10 AM 12/25/2022    8:46 AM  CMP  Glucose 70 - 99 mg/dL 91  97  78   BUN 8 - 23 mg/dL 21  19  15    Creatinine 0.61 - 1.24 mg/dL 4.09  8.11  9.14   Sodium 135 - 145 mmol/L 141  141  142   Potassium 3.5 - 5.1 mmol/L 4.3  4.1  4.2   Chloride 98 - 111 mmol/L 108  110  107   CO2 22 - 32 mmol/L 28  26  30    Calcium 8.9 - 10.3 mg/dL 9.3  9.3  9.7   Total Protein 6.5 - 8.1 g/dL 6.2  6.2  6.4   Total Bilirubin 0.3 - 1.2 mg/dL 0.7  0.6  0.6   Alkaline Phos 38 - 126 U/L 74  72  86   AST 15 - 41 U/L 24  36  25   ALT 0 - 44 U/L 13  27  23      Lab Results  Component Value Date   LDH 190 10/15/2023    RADIOGRAPHIC STUDIES: I have personally reviewed the radiological images as listed and agreed with the findings in the report. No results found.  ASSESSMENT & PLAN:  DEADRICK VERDUGO is a 77 y.o.  male with a history of :  1 Follicular lymphoma -Previously treated with Revlimid and Rituxan, currently on Rituxan maintenance q2 months . CT chest abdomen pelvis on 06/27/2021 which showed no evidence of lymphoma recurrence on his CT chest abdomen pelvis.  2.   history of diffuse large B-cell lymphoma currently in remission Status post treatment with modified dose reduced R-CHOP  3.  History of Neulasta related ARDS  PLAN:  -Discussed lab results  on 10/15/23 in detail with patient. CBC showed WBC of 6.5K, hemoglobin of 14.8, and platelets of 142K. -minimal thrombocytopenia, CBC otherwise normal -CMP normal -LDH normal -knot in groin could be a carbuncle or a small skin abscess forming -recommend using warm compresses or topical triple antibiotics -advised patient to monitor for changes in the knot in his  groin. He shall inform us if the knot is persistent or increases in size -Will continue to hold off on maintenance Rituxan and and stay in surveillance mode. -Patient has no lab, clinical  findings suggestive of recurrent/progressive lymphoma at this time. -will continue to monitor with labs in 6 months -answered all of patient's questions in detail  FOLLOW-UP Return to clinic with Dr. Candise Che with labs in 6 months  The total time spent in the appointment was 23 minutes* .  All of the patient's questions were answered with apparent satisfaction. The patient knows to call the clinic with any problems, questions or concerns.   Wyvonnia Lora MD MS AAHIVMS [facility] Hematology/Oncology Physician [facility]  .*Total Encounter Time as defined by the Centers for Medicare and Medicaid Services includes, in addition to the face-to-face time of a patient visit (documented in the note above) non-face-to-face time: obtaining and reviewing outside history, ordering and reviewing medications, tests or procedures, care coordination (communications with other health care professionals or caregivers) and documentation in the medical record.    I,Mitra Faeizi,acting as a Neurosurgeon for Wyvonnia Lora, MD.,have documented all relevant documentation on the behalf of Wyvonnia Lora, MD,as directed by  Wyvonnia Lora, MD while in the presence of Wyvonnia Lora, MD.  .I have reviewed the above documentation for accuracy and completeness, and I agree with the above. Johney Maine MD

## 2023-10-21 ENCOUNTER — Encounter: Payer: Self-pay | Admitting: Hematology

## 2023-10-31 DIAGNOSIS — D696 Thrombocytopenia, unspecified: Secondary | ICD-10-CM | POA: Diagnosis not present

## 2023-10-31 DIAGNOSIS — E7800 Pure hypercholesterolemia, unspecified: Secondary | ICD-10-CM | POA: Diagnosis not present

## 2023-11-07 DIAGNOSIS — G479 Sleep disorder, unspecified: Secondary | ICD-10-CM | POA: Diagnosis not present

## 2023-11-07 DIAGNOSIS — Z6829 Body mass index (BMI) 29.0-29.9, adult: Secondary | ICD-10-CM | POA: Diagnosis not present

## 2023-11-07 DIAGNOSIS — M5430 Sciatica, unspecified side: Secondary | ICD-10-CM | POA: Diagnosis not present

## 2023-11-07 DIAGNOSIS — E038 Other specified hypothyroidism: Secondary | ICD-10-CM | POA: Diagnosis not present

## 2023-11-07 DIAGNOSIS — N400 Enlarged prostate without lower urinary tract symptoms: Secondary | ICD-10-CM | POA: Diagnosis not present

## 2023-11-07 DIAGNOSIS — R972 Elevated prostate specific antigen [PSA]: Secondary | ICD-10-CM | POA: Diagnosis not present

## 2023-11-07 DIAGNOSIS — Z125 Encounter for screening for malignant neoplasm of prostate: Secondary | ICD-10-CM | POA: Diagnosis not present

## 2023-11-07 DIAGNOSIS — Z0000 Encounter for general adult medical examination without abnormal findings: Secondary | ICD-10-CM | POA: Diagnosis not present

## 2023-11-07 DIAGNOSIS — E7800 Pure hypercholesterolemia, unspecified: Secondary | ICD-10-CM | POA: Diagnosis not present

## 2023-11-07 DIAGNOSIS — F411 Generalized anxiety disorder: Secondary | ICD-10-CM | POA: Diagnosis not present

## 2023-11-07 DIAGNOSIS — Z79899 Other long term (current) drug therapy: Secondary | ICD-10-CM | POA: Diagnosis not present

## 2024-01-04 ENCOUNTER — Emergency Department (HOSPITAL_BASED_OUTPATIENT_CLINIC_OR_DEPARTMENT_OTHER): Payer: Non-veteran care

## 2024-01-04 ENCOUNTER — Emergency Department (HOSPITAL_BASED_OUTPATIENT_CLINIC_OR_DEPARTMENT_OTHER): Payer: Non-veteran care | Admitting: Radiology

## 2024-01-04 ENCOUNTER — Encounter (HOSPITAL_BASED_OUTPATIENT_CLINIC_OR_DEPARTMENT_OTHER): Payer: Self-pay | Admitting: Emergency Medicine

## 2024-01-04 ENCOUNTER — Emergency Department (HOSPITAL_BASED_OUTPATIENT_CLINIC_OR_DEPARTMENT_OTHER)
Admission: EM | Admit: 2024-01-04 | Discharge: 2024-01-04 | Disposition: A | Discharge status: Home / Self Care | Payer: Non-veteran care | Attending: Emergency Medicine | Admitting: Emergency Medicine

## 2024-01-04 ENCOUNTER — Other Ambulatory Visit: Payer: Self-pay

## 2024-01-04 DIAGNOSIS — Y93K1 Activity, walking an animal: Secondary | ICD-10-CM | POA: Insufficient documentation

## 2024-01-04 DIAGNOSIS — S0993XA Unspecified injury of face, initial encounter: Secondary | ICD-10-CM | POA: Diagnosis present

## 2024-01-04 DIAGNOSIS — S63502A Unspecified sprain of left wrist, initial encounter: Secondary | ICD-10-CM | POA: Diagnosis not present

## 2024-01-04 DIAGNOSIS — S0181XA Laceration without foreign body of other part of head, initial encounter: Secondary | ICD-10-CM | POA: Insufficient documentation

## 2024-01-04 DIAGNOSIS — W000XXA Fall on same level due to ice and snow, initial encounter: Secondary | ICD-10-CM | POA: Diagnosis not present

## 2024-01-04 DIAGNOSIS — W19XXXA Unspecified fall, initial encounter: Secondary | ICD-10-CM

## 2024-01-04 MED ORDER — LIDOCAINE-EPINEPHRINE (PF) 2 %-1:200000 IJ SOLN
10.0000 mL | Freq: Once | INTRAMUSCULAR | Status: AC
  Administered 2024-01-04: 10 mL
  Filled 2024-01-04: qty 20

## 2024-01-04 NOTE — Discharge Instructions (Signed)
 Please read and follow all provided instructions.  Your diagnoses today include:  1. Fall, initial encounter   2. Sprain of left wrist, initial encounter   3. Facial laceration, initial encounter     Tests performed today include: CT scan of your head and face that did not show any serious injury, just hematoma and laceration Wrist x-ray: was negative Vital signs. See below for your results today.   Medications prescribed:  None  Take any prescribed medications only as directed.  Home care instructions:  Follow any educational materials contained in this packet.  BE VERY CAREFUL not to take multiple medicines containing Tylenol  (also called acetaminophen ). Doing so can lead to an overdose which can damage your liver and cause liver failure and possibly death.   Follow-up instructions: Please follow-up with your primary care provider in 1 week for wound recheck and suture removal.  Return instructions:  SEEK IMMEDIATE MEDICAL ATTENTION IF: There is confusion or drowsiness (although children frequently become drowsy after injury).  You cannot awaken the injured person.  You have more than one episode of vomiting.  You notice dizziness or unsteadiness which is getting worse, or inability to walk.  You have convulsions or unconsciousness.  You experience severe, persistent headaches not relieved by Tylenol . You cannot use arms or legs normally.  There are changes in pupil sizes. (This is the black center in the colored part of the eye)  There is clear or bloody discharge from the nose or ears.  You have change in speech, vision, swallowing, or understanding.  Localized weakness, numbness, tingling, or change in bowel or bladder control. You have any other emergent concerns.  Additional Information: You have had a head injury which does not appear to require admission at this time.  Your vital signs today were: BP (!) 154/87 (BP Location: Right Arm)   Pulse 96   Temp 98.1 F  (36.7 C)   Resp 20   Ht 6' 1 (1.854 m)   Wt 98.9 kg   SpO2 96%   BMI 28.76 kg/m  If your blood pressure (BP) was elevated above 135/85 this visit, please have this repeated by your doctor within one month. --------------

## 2024-01-04 NOTE — ED Notes (Signed)
 Pt alert and oriented X 4 at the time of discharge. RR even and unlabored. No acute distress noted. Pt verbalized understanding of discharge instructions as discussed. Pt ambulatory to lobby at time of discharge.

## 2024-01-04 NOTE — ED Provider Notes (Signed)
 Ethan Stewart Provider Note   CSN: [id number] Arrival date & time: 01/04/24  9043     History  Chief Complaint  Patient presents with   Ethan Stewart    Ethan Stewart is a 78 y.o. male.  Patient presents to the emergency department for evaluation of injuries sustained during a fall this morning.  Patient was walking his dog and slipped on ice.  He fell forward and struck his head on the ground.  He sustained a laceration above the left eye and an abrasion to the chin.  Denies anticoagulation or aspirin .  He also has some swelling and pain in the left wrist.  He denies hip or other lower extremity injuries.  He did not have any preceding chest pain or shortness of breath.  Currently denies headache, neck pain, confusion, vomiting.  He had to walk about 1/4 mile and drive back home after the fall. Tetanus is up to date (~3 yrs).       Home Medications Prior to Admission medications   Medication Sig Start Date End Date Taking? Authorizing Provider  cholecalciferol (VITAMIN D3) 25 MCG (1000 UNIT) tablet 1 tablet    [provider]  diazepam  (VALIUM ) 2 MG tablet Take 2 mg by mouth daily as needed for anxiety.    [provider]  levothyroxine (SYNTHROID , LEVOTHROID) 88 MCG tablet Take 88 mcg by mouth daily before breakfast.  08/16/18   [provider]  Multiple Vitamins-Minerals (CENTRUM SILVER PO) Take by mouth daily.    [provider]  polyethylene glycol powder (GLYCOLAX /MIRALAX ) 17 GM/SCOOP powder TAKE AS DIRECTED BY MOUTH 05/04/20   [provider]  simvastatin  (ZOCOR ) 20 MG tablet Take 20 mg by mouth daily.    [provider]  tamsulosin  (FLOMAX ) 0.4 MG CAPS capsule Take 1 capsule (0.4 mg total) by mouth at bedtime. 01/17/20   Ethan Emaline Brink, MD  temazepam  (RESTORIL ) 15 MG capsule Take 15 mg by mouth at bedtime as needed for sleep.     [provider]  vitamin B-12 (CYANOCOBALAMIN)  500 MCG tablet Take 500 mcg by mouth daily.    [provider]      Allergies    Pegfilgrastim  and Amoxicillin    Review of Systems   Review of Systems  Physical Exam Updated Vital Signs BP (!) 154/87 (BP Location: Right Arm)   Pulse 96   Temp 98.1 F (36.7 C)   Resp 20   Ht 6' 1 (1.854 m)   Wt 98.9 kg   SpO2 96%   BMI 28.76 kg/m  Physical Exam Vitals and nursing note reviewed.  Constitutional:      Appearance: He is well-developed.  HENT:     Head: Normocephalic. No raccoon eyes or Battle's sign.     Comments: There is a 2 cm jagged laceration to the superior lateral aspect of the left orbit.  No active bleeding.  Wound base is clean.  There is a minimal superficial abrasion to the chin.    Right Ear: Tympanic membrane, ear canal and external ear normal. No hemotympanum.     Left Ear: Tympanic membrane, ear canal and external ear normal. No hemotympanum.     Nose: Nose normal.  Eyes:     General: Lids are normal.     Conjunctiva/sclera: Conjunctivae normal.     Pupils: Pupils are equal, round, and reactive to light.     Comments: No visible hyphema  Cardiovascular:  Rate and Rhythm: Normal rate and regular rhythm.  Pulmonary:     Effort: Pulmonary effort is normal.     Breath sounds: Normal breath sounds.  Abdominal:     Palpations: Abdomen is soft.     Tenderness: There is no abdominal tenderness.  Musculoskeletal:        General: Normal range of motion.     Left elbow: Normal range of motion. No tenderness.     Left forearm: No tenderness.     Left wrist: Swelling and bony tenderness present. No snuff box tenderness. Normal range of motion. Normal pulse.     Left hand: No tenderness.     Cervical back: Normal range of motion and neck supple. No tenderness or bony tenderness.     Thoracic back: No tenderness or bony tenderness.     Lumbar back: No tenderness or bony tenderness.  Skin:    General: Skin is warm and dry.  Neurological:     Mental  Status: He is alert and oriented to person, place, and time.     GCS: GCS eye subscore is 4. GCS verbal subscore is 5. GCS motor subscore is 6.     Cranial Nerves: No cranial nerve deficit.     Sensory: No sensory deficit.     Coordination: Coordination normal.     ED Results / Procedures / Treatments   Labs (all labs ordered are listed, but only abnormal results are displayed) Labs Reviewed - No data to display  EKG None  Radiology No results found.  Procedures .Laceration Repair  Date/Time: 01/04/2024 11:11 AM  Performed by: Ethan Chew, PA-C Authorized by: Ethan Chew, PA-C   Consent:    Consent obtained:  Verbal   Consent given by:  Patient   Risks discussed:  Poor cosmetic result, pain, infection and poor wound healing Universal protocol:    Patient identity confirmed:  Verbally with patient and provided demographic data Anesthesia:    Anesthesia method:  Local infiltration   Local anesthetic:  Lidocaine  2% WITH epi Laceration details:    Location:  Face   Face location:  L eyebrow   Length (cm):  1.5 Pre-procedure details:    Preparation:  Patient was prepped and draped in usual sterile fashion and imaging obtained to evaluate for foreign bodies Exploration:    Imaging obtained comment:  CT   Imaging outcome: foreign body not noted     Wound exploration: wound explored through full range of motion and entire depth of wound visualized     Wound extent: no underlying fracture     Contaminated: no   Treatment:    Area cleansed with:  Shur-Clens   Amount of cleaning:  Standard Skin repair:    Repair method:  Sutures   Suture size:  5-0   Suture material:  Nylon   Suture technique:  Simple interrupted   Number of sutures:  4 Approximation:    Approximation:  Close Repair type:    Repair type:  Simple Post-procedure details:    Dressing:  Open (no dressing)     Medications Ordered in ED Medications - No data to display  ED Course/ Medical  Decision Making/ A&P    Patient seen and examined. History obtained directly from patient.   Labs/EKG: None ordered.  Imaging: Ordered CT head/maxillofacial, X-ray wrist  Medications/Fluids: Ordered: lidocaine  w epi.   Most recent vital signs reviewed and are as follows: BP (!) 154/87 (BP Location: Right Arm)   Pulse 96  Temp 98.1 F (36.7 C)   Resp 20   Ht 6' 1 (1.854 m)   Wt 98.9 kg   SpO2 96%   BMI 28.76 kg/m   Initial impression: Fall, left orbital laceration, chin abrasion, left wrist injury  11:12 AM Reassessment performed. Patient appears stable.  No decompensation during ED stay.  Wound repaired without complication.  Imaging personally visualized and interpreted including: CT head and CT maxillofacial, agree no fractures.  X-ray of the wrist, agree negative.  Reviewed pertinent lab work and imaging with patient at bedside. Questions answered.   Most current vital signs reviewed and are as follows: BP (!) 154/87 (BP Location: Right Arm)   Pulse 96   Temp 98.1 F (36.7 C)   Resp 20   Ht 6' 1 (1.854 m)   Wt 98.9 kg   SpO2 96%   BMI 28.76 kg/m   Plan: Discharge to home.   Prescriptions written for: None  Other home care instructions discussed: Ice on sore areas 15 minutes every hour, do not put ice directly on skin.  ED return instructions discussed: Patient was counseled on head injury precautions and symptoms that should indicate their return to the ED.  These include severe worsening headache, vision changes, confusion, loss of consciousness, trouble walking, nausea & vomiting, or weakness/tingling in extremities.    Patient/wife counseled on wound care. Patient counseled on need to return or see PCP/urgent care for suture removal in 7 days. Patient was urged to return to the Emergency Department urgently with worsening pain, swelling, expanding erythema especially if it streaks away from the affected area, fever, or if they have any other concerns.  Patient verbalized understanding.                                   Medical Decision Making Amount and/or Complexity of Data Reviewed Radiology: ordered.  Risk Prescription drug management.   Head injury: No focal neurodeficits, imaging negative.  Patient without neck pain.  No extremity symptoms other than wrist pain.  No anticoagulation.  Wrist injury: Suspect sprain, x-ray negative for fracture, patient with good range of motion.  Declined splint.  Facial laceration: Repaired without complication.  Patient counseled as above.        Final Clinical Impression(s) / ED Diagnoses Final diagnoses:  Fall, initial encounter  Sprain of left wrist, initial encounter  Facial laceration, initial encounter    Rx / DC Orders ED Discharge Orders     None         Ethan Chew, PA-C 01/04/24 1114    Ethan Hamilton, MD 01/04/24 [phone]

## 2024-01-04 NOTE — ED Triage Notes (Signed)
 Pt arrived POV from home, caox4, ambulatory reporting slip and fall on the ice when walking his dog approx 30 min ago. Pt hit his head and has lac to L eyebrow, abrasion to chin, and pain and swelling to R wrist. Pt denies LOC and does not take blood thinner med. Pt denies numbness/tingling/weakness in extremities.

## 2024-01-13 DIAGNOSIS — S63502S Unspecified sprain of left wrist, sequela: Secondary | ICD-10-CM | POA: Diagnosis not present

## 2024-01-13 DIAGNOSIS — Z6830 Body mass index (BMI) 30.0-30.9, adult: Secondary | ICD-10-CM | POA: Diagnosis not present

## 2024-01-13 DIAGNOSIS — S0181XD Laceration without foreign body of other part of head, subsequent encounter: Secondary | ICD-10-CM | POA: Diagnosis not present

## 2024-01-19 DIAGNOSIS — Z008 Encounter for other general examination: Secondary | ICD-10-CM | POA: Diagnosis not present

## 2024-01-29 ENCOUNTER — Encounter: Payer: Self-pay | Admitting: Hematology

## 2024-02-19 DIAGNOSIS — R0989 Other specified symptoms and signs involving the circulatory and respiratory systems: Secondary | ICD-10-CM | POA: Diagnosis not present

## 2024-04-13 ENCOUNTER — Other Ambulatory Visit: Payer: Self-pay

## 2024-04-13 DIAGNOSIS — C8298 Follicular lymphoma, unspecified, lymph nodes of multiple sites: Secondary | ICD-10-CM

## 2024-04-13 DIAGNOSIS — C8330 Diffuse large B-cell lymphoma, unspecified site: Secondary | ICD-10-CM

## 2024-04-14 ENCOUNTER — Inpatient Hospital Stay: Payer: Medicare HMO | Attending: Hematology

## 2024-04-14 ENCOUNTER — Inpatient Hospital Stay (HOSPITAL_BASED_OUTPATIENT_CLINIC_OR_DEPARTMENT_OTHER): Payer: No Typology Code available for payment source | Admitting: Hematology

## 2024-04-14 VITALS — BP 152/82 | HR 60 | Temp 97.7°F | Resp 16 | Wt 219.5 lb

## 2024-04-14 DIAGNOSIS — Z87891 Personal history of nicotine dependence: Secondary | ICD-10-CM | POA: Insufficient documentation

## 2024-04-14 DIAGNOSIS — C8298 Follicular lymphoma, unspecified, lymph nodes of multiple sites: Secondary | ICD-10-CM | POA: Diagnosis not present

## 2024-04-14 DIAGNOSIS — Z8616 Personal history of COVID-19: Secondary | ICD-10-CM | POA: Insufficient documentation

## 2024-04-14 DIAGNOSIS — C8330 Diffuse large B-cell lymphoma, unspecified site: Secondary | ICD-10-CM

## 2024-04-14 DIAGNOSIS — Z8572 Personal history of non-Hodgkin lymphomas: Secondary | ICD-10-CM | POA: Insufficient documentation

## 2024-04-14 LAB — CBC WITH DIFFERENTIAL (CANCER CENTER ONLY)
Abs Immature Granulocytes: 0.01 10*3/uL (ref 0.00–0.07)
Basophils Absolute: 0 10*3/uL (ref 0.0–0.1)
Basophils Relative: 1 %
Eosinophils Absolute: 0.1 10*3/uL (ref 0.0–0.5)
Eosinophils Relative: 2 %
HCT: 42.8 % (ref 39.0–52.0)
Hemoglobin: 14.9 g/dL (ref 13.0–17.0)
Immature Granulocytes: 0 %
Lymphocytes Relative: 29 %
Lymphs Abs: 1.2 10*3/uL (ref 0.7–4.0)
MCH: 29.9 pg (ref 26.0–34.0)
MCHC: 34.8 g/dL (ref 30.0–36.0)
MCV: 85.9 fL (ref 80.0–100.0)
Monocytes Absolute: 0.4 10*3/uL (ref 0.1–1.0)
Monocytes Relative: 10 %
Neutro Abs: 2.3 10*3/uL (ref 1.7–7.7)
Neutrophils Relative %: 58 %
Platelet Count: 144 10*3/uL — ABNORMAL LOW (ref 150–400)
RBC: 4.98 MIL/uL (ref 4.22–5.81)
RDW: 13.6 % (ref 11.5–15.5)
WBC Count: 4 10*3/uL (ref 4.0–10.5)
nRBC: 0 % (ref 0.0–0.2)

## 2024-04-14 LAB — CMP (CANCER CENTER ONLY)
ALT: 24 U/L (ref 0–44)
AST: 27 U/L (ref 15–41)
Albumin: 4.4 g/dL (ref 3.5–5.0)
Alkaline Phosphatase: 77 U/L (ref 38–126)
Anion gap: 5 (ref 5–15)
BUN: 16 mg/dL (ref 8–23)
CO2: 28 mmol/L (ref 22–32)
Calcium: 9.2 mg/dL (ref 8.9–10.3)
Chloride: 109 mmol/L (ref 98–111)
Creatinine: 0.99 mg/dL (ref 0.61–1.24)
GFR, Estimated: >60 mL/min (ref >60)
Glucose, Bld: 107 mg/dL — ABNORMAL HIGH (ref 70–99)
Potassium: 4 mmol/L (ref 3.5–5.1)
Sodium: 142 mmol/L (ref 135–145)
Total Bilirubin: 0.6 mg/dL (ref 0.0–1.2)
Total Protein: 6.2 g/dL — ABNORMAL LOW (ref 6.5–8.1)

## 2024-04-14 LAB — LACTATE DEHYDROGENASE: LDH: 183 U/L (ref 98–192)

## 2024-04-14 NOTE — Progress Notes (Signed)
 [facility]   [phone] [phone]   Hematology oncology clinic note  Patient Care Team: Jimmey Mould, MD as PCP - General (Family Medicine)  Date of Service:  04/14/2024   CHIEF COMPLAINTS/PURPOSE OF CONSULTATION:  Continued f/u for evaluation and management of FL and DLBCL  Oncology History  DLBCL (diffuse large B cell lymphoma) (HCC)  06/21/2015 Imaging   CT Chest/abd/pelvis: IMPRESSION: 5 mm nodule within the right lower lobe as described.   Pneumobilia consistent with a prior sphincterotomy.   No acute abnormality is identified. No findings to suggest chest etiology for the known lesion at C5 are seen.   06/21/2015 Imaging   MRI c spine1. Diffuse marrow replacement of the C5 vertebral body highly concerning for neoplasm (metastatic disease, myeloma, or lymphoma). Epidural tumor at this level results in moderate spinal stenosis with moderate impression on the spinal cord    07/10/2015 PET scan   1. The previously demonstrated abnormal C5 vertebral body is hypermetabolic. No other osseous lesions demonstrated. 2. There are small hypermetabolic lymph nodes within the left mesentery and both inguinal regions.    07/26/2015 Initial Biopsy   Had a C5 corpectomy pathology showed diffuse large B-cell lymphoma   08/08/2015 -  Chemotherapy   Started for cycle of R CHOP.    08/09/2015 -  Chemotherapy   Received first cycle of intrathecal methotrexate  plus hydrocortisone  for CNS prophylaxis.   02/05/2019 - 12/25/2021 Chemotherapy   Patient is on Treatment Plan : NON-HODGKINS LYMPHOMA Rituximab  q month        HISTORY OF PRESENTING ILLNESS: Please see previous note for details on initial presentation  INTERVAL HISTORY:  Ethan Stewart is a 78 y.o. male who is here for continued evaluation and management of follicular lymphoma and DLBCL.  He was last seen by me on 10/15/2023 and reported a knot under the skin in area of swollen groin lymph node  which was stable x6 weeks.  Today, he reports no new concerns since his last clinical visit. Patient denies any concern for fever, chills, night sweats, new lumps/bumps, changes in bowel habits, SOB, chest pain, or pain along the neck/back.   He reports that his lymph node in the groin has decreased in size.   Patient reports having a fall on ice in January while wlaking his dog. He reports having a wrist injury and notes that his wrist took most of the fall. However, he did also have trauma to the head and required stitching. CT head showed no skull fracture and normal for age noncontrast CT appearance of the brain. Patient reports that his wrist was sore for a while after the fall, but he has no wrist issues at this time.   Patient reports that he was told that he had the [facility] [place], though he is inclined to continue to receive oncologic care with us .   Patient reports that he is needing to receive the COVID-19 booster vaccine. He reports that he is otherwise UTD with his age-appropriate vaccinations.   He denies any recent major medication changes.   Patient reports having a COVID-19 infection then secondary pneumonia infection in 2023. He recovered from pneumonia after 2 months. He notes endorsing weakness and poor p.o. intake at that time. He has not had any issues since his pneumonia.   Patient reports that his wife is currently receiving treatment for breast cancer.   REVIEW OF SYSTEMS:  10 Point review  of Systems was done is negative except as noted above.   MEDICAL HISTORY:  Past Medical History:  Diagnosis Date   Anxiety    ARDS (adult respiratory distress syndrome) (HCC)    2016 August at [facility]   Arm numbness 08/07/15   Limited movement due to tumor on spine   B-cell lymphoma (HCC)    Bone cancer (HCC)     tumor on C5   Cervical spine tumor 07/03/2015   Diffuse large B cell lymphoma (HCC)    biposy 07/25/2105   GERD (gastroesophageal  reflux disease)    H. pylori infection    History of bleeding ulcers 1990's   History of blood transfusion    Hypothyroidism     SURGICAL HISTORY: Past Surgical History:  Procedure Laterality Date   ANTERIOR CERVICAL CORPECTOMY N/A 07/26/2015   Procedure: Cervical five Corpectomy/Cervical four-six Fusion/Plate ;  Surgeon: Isadora Mar, MD;  Location: [facility];  Service: Neurosurgery;  Laterality: N/A;  C5 Corpectomy/C4-6 Fusion/Plate W0-9   COLONOSCOPY     EYE SURGERY Right    cataracts   HERNIA REPAIR Bilateral    with mesh   IR GENERIC HISTORICAL  02/03/2017   IR REMOVAL TUN ACCESS W/ PORT W/O FL MOD SED 02/03/2017 Elene Griffes, MD [facility]INTER[facility]   IR REMOVAL TUN ACCESS W/ PORT W/O FL MOD SED  06/28/2020   LYMPH NODE BIOPSY Right 12/11/2018   Procedure: EXCISIONAL BIOPSY OF DEEP RIGHT INGUINAL LYMPH NODE ERAS PATHWAY;  Surgeon: Boyce Byes, MD;  Location: [facility] OR;  Service: General;  Laterality: Right;    SOCIAL HISTORY: Social History   Socioeconomic History   Marital status: Married    Spouse name: Not on file   Number of children: Not on file   Years of education: Not on file   Highest education level: Not on file  Occupational History   Not on file  Tobacco Use   Smoking status: Former    Current packs/day: 0.00    Average packs/day: 1 pack/day for 10.0 years (10.0 ttl pk-yrs)    Types: Cigarettes    Start date: 12/24/1971    Quit date: 12/23/1981    Years since quitting: 42.3   Smokeless tobacco: Never  Vaping Use   Vaping status: Never Used  Substance and Sexual Activity   Alcohol use: Yes    Alcohol/week: 5.0 standard drinks of alcohol    Types: 2 Glasses of wine, 3 Cans of beer per week   Drug use: No   Sexual activity: Yes  Other Topics Concern   Not on file  Social History Narrative   Not on file   Social Drivers of Health   Financial Resource Strain: Not on file  Food Insecurity: Not on file  Transportation Needs: Not on file  Physical Activity: Not  on file  Stress: Not on file  Social Connections: Not on file  Intimate Partner Violence: Not on file    FAMILY HISTORY: Family History  Problem Relation Age of Onset   Hypertension Mother     ALLERGIES:  is allergic to pegfilgrastim  and amoxicillin.  MEDICATIONS:  Current Outpatient Medications  Medication Sig Dispense Refill   cholecalciferol (VITAMIN D3) 25 MCG (1000 UNIT) tablet 1 tablet     diazepam  (VALIUM ) 2 MG tablet Take 2 mg by mouth daily as needed for anxiety.     levothyroxine (SYNTHROID , LEVOTHROID) 88 MCG tablet Take 88 mcg by mouth daily before breakfast.   2   Multiple Vitamins-Minerals (  CENTRUM SILVER PO) Take by mouth daily.     polyethylene glycol powder (GLYCOLAX /MIRALAX ) 17 GM/SCOOP powder TAKE AS DIRECTED BY MOUTH     simvastatin  (ZOCOR ) 20 MG tablet Take 20 mg by mouth daily.     tamsulosin  (FLOMAX ) 0.4 MG CAPS capsule Take 1 capsule (0.4 mg total) by mouth at bedtime. 30 capsule 3   temazepam  (RESTORIL ) 15 MG capsule Take 15 mg by mouth at bedtime as needed for sleep.      vitamin B-12 (CYANOCOBALAMIN) 500 MCG tablet Take 500 mcg by mouth daily.     No current facility-administered medications for this visit.    PHYSICAL EXAMINATION: ECOG PERFORMANCE STATUS: 0 .BP (!) 152/82 (BP Location: Left Arm, Patient Position: Sitting)   Pulse 60   Temp 97.7 F (36.5 C) (Temporal)   Resp 16   Wt 219 lb 8 oz (99.6 kg)   SpO2 100%   BMI 28.96 kg/m    GENERAL:alert, in no acute distress and comfortable SKIN: no acute rashes, no significant lesions EYES: conjunctiva are pink and non-injected, sclera anicteric OROPHARYNX: MMM, no exudates, no oropharyngeal erythema or ulceration NECK: supple, no JVD LYMPH:  no palpable lymphadenopathy in the cervical, axillary or inguinal regions LUNGS: clear to auscultation b/l with normal respiratory effort HEART: regular rate & rhythm ABDOMEN:  normoactive bowel sounds , non tender, not distended. Extremity: no pedal  edema PSYCH: alert & oriented x 3 with fluent speech NEURO: no focal motor/sensory deficits   LABORATORY DATA:   I have reviewed the data as listed    Latest Ref Rng & Units 04/14/2024    8:27 AM 10/15/2023    8:25 AM 04/16/2023    8:10 AM  CBC  WBC 4.0 - 10.5 K/uL 4.0  6.5  3.9   Hemoglobin 13.0 - 17.0 g/dL 16.1  09.6  04.5   Hematocrit 39.0 - 52.0 % 42.8  43.3  42.0   Platelets 150 - 400 K/uL 144  142  145        Latest Ref Rng & Units 04/14/2024    8:27 AM 10/15/2023    8:25 AM 04/16/2023    8:10 AM  CMP  Glucose 70 - 99 mg/dL 409  91  97   BUN 8 - 23 mg/dL 16  21  19    Creatinine 0.61 - 1.24 mg/dL 8.11  9.14  7.82   Sodium 135 - 145 mmol/L 142  141  141   Potassium 3.5 - 5.1 mmol/L 4.0  4.3  4.1   Chloride 98 - 111 mmol/L 109  108  110   CO2 22 - 32 mmol/L 28  28  26    Calcium 8.9 - 10.3 mg/dL 9.2  9.3  9.3   Total Protein 6.5 - 8.1 g/dL 6.2  6.2  6.2   Total Bilirubin 0.0 - 1.2 mg/dL 0.6  0.7  0.6   Alkaline Phos 38 - 126 U/L 77  74  72   AST 15 - 41 U/L 27  24  36   ALT 0 - 44 U/L 24  13  27      Lab Results  Component Value Date   LDH 183 04/14/2024    RADIOGRAPHIC STUDIES: I have personally reviewed the radiological images as listed and agreed with the findings in the report. No results found.  ASSESSMENT & PLAN:  Ethan Stewart is a 78 y.o.  male with a history of :  1 Follicular lymphoma -Previously treated with Revlimid  and Rituxan ,  currently on Rituxan  maintenance q2 months . CT chest abdomen pelvis on 06/27/2021 which showed no evidence of lymphoma recurrence on his CT chest abdomen pelvis.  2.   history of diffuse large B-cell lymphoma currently in remission Status post treatment with modified dose reduced R-CHOP  3.  History of Neulasta  related ARDS  PLAN:  -Discussed lab results on 04/14/24  in detail with patient. CBC normal, showed WBC of 4.0K, hemoglobin of 14.9, and platelets of 144K. -CMP stable -LDH pending -continue to monitor for  changes in the knot in his groin. He shall inform us  if the knot is persistent or increases in size -did not feel any other enlarged lymph nodes during physical examination -Patient has no lab, clinical  findings suggestive of recurrent/progressive lymphoma at this time. -Will continue to hold off on maintenance Rituxan  and and stay in surveillance mode. -will plan for CT scan prior to his next visit in 6 months -if there are stable findings in 6 months,  his next scan may be after 2 years -Answered all of patient's questions in detail  FOLLOW-UP Ct chest abd pelvis in 5 months RTC with Dr Salomon Cree in 6 months with labs  The total time spent in the appointment was 30 minutes* .  All of the patient's questions were answered with apparent satisfaction. The patient knows to call the clinic with any problems, questions or concerns.   Jacquelyn Matt MD MS AAHIVMS [facility] Hematology/Oncology Physician [place]  .*Total Encounter Time as defined by the Centers for Medicare and Medicaid Services includes, in addition to the face-to-face time of a patient visit (documented in the note above) non-face-to-face time: obtaining and reviewing outside history, ordering and reviewing medications, tests or procedures, care coordination (communications with other health care professionals or caregivers) and documentation in the medical record.    I,Mitra Faeizi,acting as a Neurosurgeon for Jacquelyn Matt, MD.,have documented all relevant documentation on the behalf of Jacquelyn Matt, MD,as directed by  Jacquelyn Matt, MD while in the presence of Jacquelyn Matt, MD.  .I have reviewed the above documentation for accuracy and completeness, and I agree with the above. .Lashala Laser Kishore Durrell Barajas MD

## 2024-04-15 ENCOUNTER — Telehealth: Payer: Self-pay | Admitting: Hematology

## 2024-04-15 NOTE — Telephone Encounter (Signed)
 Spoke with patient confirming upcoming appointment

## 2024-04-20 ENCOUNTER — Encounter: Payer: Self-pay | Admitting: Hematology

## 2024-05-03 DIAGNOSIS — D225 Melanocytic nevi of trunk: Secondary | ICD-10-CM | POA: Diagnosis not present

## 2024-05-03 DIAGNOSIS — L821 Other seborrheic keratosis: Secondary | ICD-10-CM | POA: Diagnosis not present

## 2024-05-03 DIAGNOSIS — D692 Other nonthrombocytopenic purpura: Secondary | ICD-10-CM | POA: Diagnosis not present

## 2024-05-03 DIAGNOSIS — L814 Other melanin hyperpigmentation: Secondary | ICD-10-CM | POA: Diagnosis not present

## 2024-05-03 DIAGNOSIS — Z85828 Personal history of other malignant neoplasm of skin: Secondary | ICD-10-CM | POA: Diagnosis not present

## 2024-05-03 DIAGNOSIS — L570 Actinic keratosis: Secondary | ICD-10-CM | POA: Diagnosis not present

## 2024-05-10 DIAGNOSIS — R972 Elevated prostate specific antigen [PSA]: Secondary | ICD-10-CM | POA: Diagnosis not present

## 2024-05-10 DIAGNOSIS — I499 Cardiac arrhythmia, unspecified: Secondary | ICD-10-CM | POA: Diagnosis not present

## 2024-05-10 DIAGNOSIS — F411 Generalized anxiety disorder: Secondary | ICD-10-CM | POA: Diagnosis not present

## 2024-05-10 DIAGNOSIS — G479 Sleep disorder, unspecified: Secondary | ICD-10-CM | POA: Diagnosis not present

## 2024-05-10 DIAGNOSIS — Z6829 Body mass index (BMI) 29.0-29.9, adult: Secondary | ICD-10-CM | POA: Diagnosis not present

## 2024-05-11 ENCOUNTER — Encounter: Payer: Self-pay | Admitting: Pulmonary Disease

## 2024-05-11 ENCOUNTER — Ambulatory Visit (INDEPENDENT_AMBULATORY_CARE_PROVIDER_SITE_OTHER): Admitting: Pulmonary Disease

## 2024-05-11 VITALS — BP 129/79 | HR 68 | Ht 72.0 in | Wt 219.0 lb

## 2024-05-11 DIAGNOSIS — R9389 Abnormal findings on diagnostic imaging of other specified body structures: Secondary | ICD-10-CM

## 2024-05-11 NOTE — Patient Instructions (Addendum)
 I will see you a year from now  I think with you maintaining your activity level, with no active symptoms at present, your lungs sound good We do not need to do any testing at present  Call us  with significant concerns

## 2024-05-11 NOTE — Progress Notes (Signed)
 Ethan Stewart    [id number]    1946-04-23  Primary Care Physician:Ross, Laretta Pleasure, MD  Referring Physician: Jimmey Mould, MD [address] [place],  [place] [address]  Chief complaint:   Weakness, shortness of breath  HPI:  Symptoms are stable  Denies any active symptoms at present  Continues to stay active able to walk 3 miles a day, does about 10,000 steps a day  No changes in his weight Appetite is good  Had had a CAT scan in 2024 that was improved compared to 2023 No new symptoms have developed since then  His lymphoma has been on the remission and follows up with oncology on a regular basis  History significant for history of non-Hodgkin's lymphoma in 2016 with a recurrence in 2019 Has been through chemo and radiation and also  During his treatment in 2016 for non-Hodgkin's lymphoma, did suffer ARDS, was on the ventilator for a while and required rehab-recovered fully  Past history of smoking-quit in 1993  No significant history of previous lung disease  Not usually on any inhalers or significant shortness of breath with activities  Other medical problems include history of GERD, history of depression/anxiety, history of hypothyroidism His oncology diagnoses include diffuse B-cell lymphoma stage IV AE with extranodal involvement of C5 vertebra s/p C5 corpectomy G-CSF related capillary leak syndrome and ARDS, left neck basal cell carcinoma -Had 6 cycles of R CHOP Continues to follow with oncology  Outpatient Encounter Medications as of 05/11/2024  Medication Sig   cholecalciferol (VITAMIN D3) 25 MCG (1000 UNIT) tablet 1 tablet   diazepam  (VALIUM ) 2 MG tablet Take 2 mg by mouth daily as needed for anxiety.   levothyroxine (SYNTHROID , LEVOTHROID) 88 MCG tablet Take 88 mcg by mouth daily before breakfast.    Multiple Vitamins-Minerals (CENTRUM SILVER PO) Take by mouth daily.   polyethylene glycol powder (GLYCOLAX /MIRALAX ) 17 GM/SCOOP powder  TAKE AS DIRECTED BY MOUTH   simvastatin  (ZOCOR ) 20 MG tablet Take 20 mg by mouth daily.   tamsulosin  (FLOMAX ) 0.4 MG CAPS capsule Take 1 capsule (0.4 mg total) by mouth at bedtime.   temazepam  (RESTORIL ) 15 MG capsule Take 15 mg by mouth at bedtime as needed for sleep.    vitamin B-12 (CYANOCOBALAMIN) 500 MCG tablet Take 500 mcg by mouth daily.   No facility-administered encounter medications on file as of 05/11/2024.    Allergies as of 05/11/2024 - Review Complete 05/11/2024  Allergen Reaction Noted   Pegfilgrastim  Other (See Comments) 09/02/2015   Amoxicillin Other (See Comments) 02/25/2022    Past Medical History:  Diagnosis Date   Anxiety    ARDS (adult respiratory distress syndrome) (HCC)    2016 August at [facility]   Arm numbness 08/07/15   Limited movement due to tumor on spine   B-cell lymphoma (HCC)    Bone cancer (HCC)     tumor on C5   Cervical spine tumor 07/03/2015   Diffuse large B cell lymphoma (HCC)    biposy 07/25/2105   GERD (gastroesophageal reflux disease)    H. pylori infection    History of bleeding ulcers 1990's   History of blood transfusion    Hypothyroidism     Past Surgical History:  Procedure Laterality Date   ANTERIOR CERVICAL CORPECTOMY N/A 07/26/2015   Procedure: Cervical five Corpectomy/Cervical four-six Fusion/Plate ;  Surgeon: Isadora Mar, MD;  Location: [facility] OR[facility];  Service: Neurosurgery;  Laterality: N/A;  C5 Corpectomy/C4-6 Fusion/Plate  C4-6   COLONOSCOPY     EYE SURGERY Right    cataracts   HERNIA REPAIR Bilateral    with mesh   IR GENERIC HISTORICAL  02/03/2017   IR REMOVAL TUN ACCESS W/ PORT W/O FL MOD SED 02/03/2017 Elene Griffes, MD [facility]   IR REMOVAL TUN ACCESS W/ PORT W/O FL MOD SED  06/28/2020   LYMPH NODE BIOPSY Right 12/11/2018   Procedure: EXCISIONAL BIOPSY OF DEEP RIGHT INGUINAL LYMPH NODE ERAS PATHWAY;  Surgeon: Boyce Byes, MD;  Location: [facility] OR;  Service: General;  Laterality: Right;    Family History  Problem Relation  Age of Onset   Hypertension Mother     Social History   Socioeconomic History   Marital status: Married    Spouse name: Not on file   Number of children: Not on file   Years of education: Not on file   Highest education level: Not on file  Occupational History   Not on file  Tobacco Use   Smoking status: Former    Current packs/day: 0.00    Average packs/day: 1 pack/day for 10.0 years (10.0 ttl pk-yrs)    Types: Cigarettes    Start date: 12/24/1971    Quit date: 12/23/1981    Years since quitting: 42.4   Smokeless tobacco: Never  Vaping Use   Vaping status: Never Used  Substance and Sexual Activity   Alcohol use: Yes    Alcohol/week: 5.0 standard drinks of alcohol    Types: 2 Glasses of wine, 3 Cans of beer per week   Drug use: No   Sexual activity: Yes  Other Topics Concern   Not on file  Social History Narrative   Not on file   Social Drivers of Health   Financial Resource Strain: Not on file  Food Insecurity: Not on file  Transportation Needs: Not on file  Physical Activity: Not on file  Stress: Not on file  Social Connections: Not on file  Intimate Partner Violence: Not on file    Review of Systems  Constitutional:  Negative for fatigue.  Respiratory:  Negative for shortness of breath.     Vitals:   05/11/24 1517  BP: 129/79  Pulse: 68  SpO2: 95%     Physical Exam Constitutional:      Appearance: Normal appearance.  HENT:     Head: Normocephalic.     Mouth/Throat:     Mouth: Mucous membranes are moist.  Eyes:     General: No scleral icterus. Cardiovascular:     Rate and Rhythm: Normal rate and regular rhythm.     Heart sounds: No murmur heard.    No friction rub.  Pulmonary:     Effort: No respiratory distress.     Breath sounds: No stridor. No wheezing, rhonchi or rales.  Musculoskeletal:        General: Normal range of motion.     Cervical back: No rigidity or tenderness.  Neurological:     General: No focal deficit present.      Mental Status: He is alert.  Psychiatric:        Mood and Affect: Mood normal.     Data Reviewed: CT scan from 2024 and 2023 reviewed, compared with previous  Oncology notes reviewed-Dr. Epifania Haskell notes  Assessment:  Past COVID infection  Concern for scarring at the bases of the lungs - Symptoms have been relatively stable - Activity level remains maintained - Has not had any lower respiratory tract infection recently  Overall I think he continues to be stable and doing better  Past chemoradiation for non-Hodgkin's lymphoma    Plan/Recommendations: As he continues to do well, I do not believe any testing is warranted at present  Tentative follow-up in about a year  Encouraged to continue to follow-up with oncology  Encouraged to stay active  Give us  a call with any significant concerns  Myer Artis MD [facility] 05/11/2024, 3:22 PM  CC: Jimmey Mould, MD

## 2024-08-25 ENCOUNTER — Ambulatory Visit (HOSPITAL_COMMUNITY)

## 2024-09-03 DIAGNOSIS — M25572 Pain in left ankle and joints of left foot: Secondary | ICD-10-CM | POA: Diagnosis not present

## 2024-09-08 ENCOUNTER — Encounter: Payer: Self-pay | Admitting: Hematology

## 2024-09-08 ENCOUNTER — Ambulatory Visit (HOSPITAL_COMMUNITY)
Admission: RE | Admit: 2024-09-08 | Discharge: 2024-09-08 | Disposition: A | Discharge status: Home / Self Care | Source: Ambulatory Visit | Attending: Hematology | Admitting: Hematology

## 2024-09-08 DIAGNOSIS — C8298 Follicular lymphoma, unspecified, lymph nodes of multiple sites: Secondary | ICD-10-CM | POA: Diagnosis present

## 2024-09-08 MED ORDER — IOHEXOL 300 MG/ML  SOLN
100.0000 mL | Freq: Once | INTRAMUSCULAR | Status: AC | PRN
  Administered 2024-09-08: 100 mL via INTRAVENOUS

## 2024-09-08 MED ORDER — IOHEXOL 9 MG/ML PO SOLN: 1000.0000 mL | ORAL | Status: AC

## 2024-09-14 DIAGNOSIS — G8929 Other chronic pain: Secondary | ICD-10-CM | POA: Diagnosis not present

## 2024-09-14 DIAGNOSIS — M25572 Pain in left ankle and joints of left foot: Secondary | ICD-10-CM | POA: Diagnosis not present

## 2024-09-20 ENCOUNTER — Ambulatory Visit: Attending: Cardiovascular Disease | Admitting: Cardiovascular Disease

## 2024-09-20 ENCOUNTER — Encounter: Payer: Self-pay | Admitting: Cardiovascular Disease

## 2024-09-20 VITALS — BP 158/90 | HR 59 | Ht 72.0 in | Wt 222.0 lb

## 2024-09-20 DIAGNOSIS — Z8249 Family history of ischemic heart disease and other diseases of the circulatory system: Secondary | ICD-10-CM | POA: Diagnosis not present

## 2024-09-20 DIAGNOSIS — I499 Cardiac arrhythmia, unspecified: Secondary | ICD-10-CM | POA: Insufficient documentation

## 2024-09-20 DIAGNOSIS — E782 Mixed hyperlipidemia: Secondary | ICD-10-CM

## 2024-09-20 DIAGNOSIS — E7800 Pure hypercholesterolemia, unspecified: Secondary | ICD-10-CM

## 2024-09-20 DIAGNOSIS — I471 Supraventricular tachycardia, unspecified: Secondary | ICD-10-CM

## 2024-09-20 DIAGNOSIS — E785 Hyperlipidemia, unspecified: Secondary | ICD-10-CM | POA: Insufficient documentation

## 2024-09-20 NOTE — Patient Instructions (Signed)
 Medication Instructions:  Your physician recommends that you continue on your current medications as directed. Please refer to the Current Medication list given to you today.  *If you need a refill on your cardiac medications before your next appointment, please call your pharmacy*   Testing/Procedures: Your physician has requested that you have an echocardiogram. Echocardiography is a painless test that uses sound waves to create images of your heart. It provides your doctor with information about the size and shape of your heart and how well your heart's chambers and valves are working. This procedure takes approximately one hour. There are no restrictions for this procedure. Please do NOT wear cologne, perfume, aftershave, or lotions (deodorant is allowed). Please arrive 15 minutes prior to your appointment time.  Please note: We ask at that you not bring children with you during ultrasound (echo/ vascular) testing. Due to room size and safety concerns, children are not allowed in the ultrasound rooms during exams. Our front office staff cannot provide observation of children in our lobby area while testing is being conducted. An adult accompanying a patient to their appointment will only be allowed in the ultrasound room at the discretion of the ultrasound technician under special circumstances. We apologize for any inconvenience.   Dr. Court has ordered a CT coronary calcium score.   Test locations:  [facility] at [facility]  [facility] [place] [facility] at [facility]  This is $99 out of pocket.   Coronary CalciumScan A coronary calcium scan is an imaging test used to look for deposits of calcium and other fatty materials (plaques) in the inner lining of the blood vessels of the heart (coronary arteries). These deposits of calcium and plaques can partly clog and narrow the coronary arteries without  producing any symptoms or warning signs. This puts a person at risk for a heart attack. This test can detect these deposits before symptoms develop. Tell a health care provider about: Any allergies you have. All medicines you are taking, including vitamins, herbs, eye drops, creams, and over-the-counter medicines. Any problems you or family members have had with anesthetic medicines. Any blood disorders you have. Any surgeries you have had. Any medical conditions you have. Whether you are pregnant or may be pregnant. What are the risks? Generally, this is a safe procedure. However, problems may occur, including: Harm to a pregnant woman and her unborn baby. This test involves the use of radiation. Radiation exposure can be dangerous to a pregnant woman and her unborn baby. If you are pregnant, you generally should not have this procedure done. Slight increase in the risk of cancer. This is because of the radiation involved in the test. What happens before the procedure? No preparation is needed for this procedure. What happens during the procedure? You will undress and remove any jewelry around your neck or chest. You will put on a hospital gown. Sticky electrodes will be placed on your chest. The electrodes will be connected to an electrocardiogram (ECG) machine to record a tracing of the electrical activity of your heart. A CT scanner will take pictures of your heart. During this time, you will be asked to lie still and hold your breath for 2-3 seconds while a picture of your heart is being taken. The procedure may vary among health care providers and hospitals. What happens after the procedure? You can get dressed. You can return to your normal activities. It is up to you to  get the results of your test. Ask your health care provider, or the department that is doing the test, when your results will be ready. Summary A coronary calcium scan is an imaging test used to look for deposits of  calcium and other fatty materials (plaques) in the inner lining of the blood vessels of the heart (coronary arteries). Generally, this is a safe procedure. Tell your health care provider if you are pregnant or may be pregnant. No preparation is needed for this procedure. A CT scanner will take pictures of your heart. You can return to your normal activities after the scan is done. This information is not intended to replace advice given to you by your health care provider. Make sure you discuss any questions you have with your health care provider. Document Released: 06/06/2008 Document Revised: 10/28/2016 Document Reviewed: 10/28/2016 Elsevier Interactive Patient Education  2017 [employer].   Follow-Up: At [facility], you and your health needs are our priority.  As part of our continuing mission to provide you with exceptional heart care, our providers are all part of one team.  This team includes your primary Cardiologist (physician) and Advanced Practice Providers or APPs (Physician Assistants and Nurse Practitioners) who all work together to provide you with the care you need, when you need it.  Your next appointment:   We will see you on an as needed basis  Provider:   Dorn Lesches, MD

## 2024-09-20 NOTE — Assessment & Plan Note (Signed)
 Father died of an MI at age 78.

## 2024-09-20 NOTE — Assessment & Plan Note (Signed)
 Patient wore a 2-week Zio patch 02/02/2024 that showed short runs of PSVT, NSVT and occasional PVCs.  He is completely asymptomatic and not aware that he is having any of these arrhythmias.  I have reassured him that they are essentially benign.  I am going to get a 2D echo to assess LV function.

## 2024-09-20 NOTE — Assessment & Plan Note (Signed)
 History of hyperlipidemia on simvastatin  with lipid profile performed 10/31/2023 revealing a total cholesterol 188, LDL 108 and HDL 60.

## 2024-09-20 NOTE — Progress Notes (Signed)
 09/20/2024 FILEMON BRETON   [birth year]  [id number]  Primary Physician Okey Carlin Redbird, MD Primary Cardiologist: Dorn JINNY Lesches MD GENI CODY MADEIRA, FSCAI  HPI:  Ethan Stewart is a 78 y.o. mildly overweight married Caucasian male father of 2, grandfather of 1 grandchild who was referred by his PCP, Dr. Okey, because of an abnormal Zio patch/arrhythmia.  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  His cardiac risk factors notable for treated hyperlipidemia.  His father did die of a myocardial infarction at age 69.  Never had a heart attack or stroke.  Denies chest pain or shortness of breath.  He walks 3 miles a day without symptoms.  He has had some oncology issues in the past beginning in 2016 when he had non-Hodgkin's lymphoma of C5 vertebrae requiring surgery and chemotherapy.  He had recurrent follicular non-Hodgkin's lymphoma in 2019 requiring oral chemotherapy.  He wore a Zio patch 02/02/2024 for unclear reasons over 2 weeks and was noted to have short episodes of PSVT, NSVT and occasional PVCs.  He is completely unaware of these.  He did have a chest CT performed 09/08/2024 that showed aortic atherosclerosis with four-chamber cardiac enlargement.   Current Meds  Medication Sig   cholecalciferol (VITAMIN D3) 25 MCG (1000 UNIT) tablet 1 tablet   diazepam  (VALIUM ) 2 MG tablet Take 2 mg by mouth daily as needed for anxiety.   levothyroxine (SYNTHROID , LEVOTHROID) 88 MCG tablet Take 88 mcg by mouth daily before breakfast.    Multiple Vitamins-Minerals (CENTRUM SILVER PO) Take by mouth daily.   polyethylene glycol powder (GLYCOLAX /MIRALAX ) 17 GM/SCOOP powder TAKE AS DIRECTED BY MOUTH   simvastatin  (ZOCOR ) 20 MG tablet Take 20 mg by mouth daily.   tamsulosin  (FLOMAX ) 0.4 MG CAPS capsule Take 1 capsule (0.4 mg total) by mouth at bedtime.   temazepam  (RESTORIL ) 15 MG capsule Take 15 mg by mouth at bedtime as needed for sleep.     vitamin B-12 (CYANOCOBALAMIN) 500 MCG tablet Take 500 mcg by mouth daily.     Allergies  Allergen Reactions   Pegfilgrastim  Other (See Comments)    ARDS likely related to Neulasta  Other reaction(s): ARDS   Amoxicillin Other (See Comments)    Social History   Socioeconomic History   Marital status: Married    Spouse name: Not on file   Number of children: Not on file   Years of education: Not on file   Highest education level: Not on file  Occupational History   Not on file  Tobacco Use   Smoking status: Former    Current packs/day: 0.00    Average packs/day: 1 pack/day for 10.0 years (10.0 ttl pk-yrs)    Types: Cigarettes    Start date: 12/24/1971    Quit date: 12/23/1981    Years since quitting: 42.7   Smokeless tobacco: Never  Vaping Use   Vaping status: Never Used  Substance and Sexual Activity   Alcohol use: Yes    Alcohol/week: 5.0 standard drinks of alcohol    Types: 2 Glasses of wine, 3 Cans of beer per week   Drug use: No   Sexual activity: Yes  Other Topics Concern   Not on file  Social History Narrative   Not on file   Social Drivers of Health   Financial Resource Strain: Not on file  Food Insecurity: Not on file  Transportation Needs: Not on file  Physical Activity:  Not on file  Stress: Not on file  Social Connections: Not on file  Intimate Partner Violence: Not on file     Review of Systems: General: negative for chills, fever, night sweats or weight changes.  Cardiovascular: negative for chest pain, dyspnea on exertion, edema, orthopnea, palpitations, paroxysmal nocturnal dyspnea or shortness of breath Dermatological: negative for rash Respiratory: negative for cough or wheezing Urologic: negative for hematuria Abdominal: negative for nausea, vomiting, diarrhea, bright red blood per rectum, melena, or hematemesis Neurologic: negative for visual changes, syncope, or dizziness All other systems reviewed and are otherwise negative except as noted  above.    Blood pressure (!) 158/90, pulse (!) 59, height 6' (1.829 m), weight 222 lb (100.7 kg), SpO2 96%.  General appearance: alert and no distress Neck: no adenopathy, no carotid bruit, no JVD, supple, symmetrical, trachea midline, and thyroid  not enlarged, symmetric, no tenderness/mass/nodules Lungs: clear to auscultation bilaterally Heart: regular rate and rhythm, S1, S2 normal, no murmur, click, rub or gallop Extremities: extremities normal, atraumatic, no cyanosis or edema Pulses: 2+ and symmetric Skin: Skin color, texture, turgor normal. No rashes or lesions Neurologic: Grossly normal  EKG EKG Interpretation Date/Time:  Monday September 20 2024 09:27:28 EDT Ventricular Rate:  65 PR Interval:  228 QRS Duration:  98 QT Interval:  388 QTC Calculation: 403 R Axis:   -21  Text Interpretation: Sinus rhythm with 1st degree A-V block When compared with ECG of 05-Mar-2022 10:12, PREVIOUS ECG IS PRESENT Confirmed by Court Carrier [phone]) on 09/20/2024 9:37:13 AM    ASSESSMENT AND PLAN:   Family history of heart disease Father died of an MI at age 2.  Cardiac arrhythmia Patient wore a 2-week Zio patch 02/02/2024 that showed short runs of PSVT, NSVT and occasional PVCs.  He is completely asymptomatic and not aware that he is having any of these arrhythmias.  I have reassured him that they are essentially benign.  I am going to get a 2D echo to assess LV function.  Hyperlipidemia History of hyperlipidemia on simvastatin  with lipid profile performed 10/31/2023 revealing a total cholesterol 188, LDL 108 and HDL 60.     Carrier DOROTHA Court MD FACP,FACC,FAHA, [facility] 09/20/2024 9:54 AM

## 2024-09-28 ENCOUNTER — Ambulatory Visit: Payer: Self-pay | Admitting: Cardiovascular Disease

## 2024-10-12 ENCOUNTER — Other Ambulatory Visit: Payer: Self-pay

## 2024-10-12 DIAGNOSIS — C8298 Follicular lymphoma, unspecified, lymph nodes of multiple sites: Secondary | ICD-10-CM

## 2024-10-12 DIAGNOSIS — C8330 Diffuse large B-cell lymphoma, unspecified site: Secondary | ICD-10-CM

## 2024-10-13 ENCOUNTER — Inpatient Hospital Stay: Admitting: Hematology

## 2024-10-13 ENCOUNTER — Inpatient Hospital Stay: Attending: Hematology

## 2024-10-13 VITALS — BP 149/91 | HR 72 | Temp 97.5°F | Resp 20 | Wt 215.9 lb

## 2024-10-13 DIAGNOSIS — C8330 Diffuse large B-cell lymphoma, unspecified site: Secondary | ICD-10-CM

## 2024-10-13 DIAGNOSIS — C8298 Follicular lymphoma, unspecified, lymph nodes of multiple sites: Secondary | ICD-10-CM

## 2024-10-13 DIAGNOSIS — C833A Diffuse large b-cell lymphoma, in remission: Secondary | ICD-10-CM | POA: Diagnosis not present

## 2024-10-13 DIAGNOSIS — C829A Follicular lymphoma, unspecified, in remission: Secondary | ICD-10-CM | POA: Insufficient documentation

## 2024-10-13 LAB — CBC WITH DIFFERENTIAL (CANCER CENTER ONLY)
Abs Immature Granulocytes: 0.01 K/uL (ref 0.00–0.07)
Basophils Absolute: 0 K/uL (ref 0.0–0.1)
Basophils Relative: 1 %
Eosinophils Absolute: 0.1 K/uL (ref 0.0–0.5)
Eosinophils Relative: 2 %
HCT: 42.8 % (ref 39.0–52.0)
Hemoglobin: 15.1 g/dL (ref 13.0–17.0)
Immature Granulocytes: 0 %
Lymphocytes Relative: 23 %
Lymphs Abs: 1.2 K/uL (ref 0.7–4.0)
MCH: 30 pg (ref 26.0–34.0)
MCHC: 35.3 g/dL (ref 30.0–36.0)
MCV: 84.9 fL (ref 80.0–100.0)
Monocytes Absolute: 0.5 K/uL (ref 0.1–1.0)
Monocytes Relative: 10 %
Neutro Abs: 3.5 K/uL (ref 1.7–7.7)
Neutrophils Relative %: 64 %
Platelet Count: 144 K/uL — ABNORMAL LOW (ref 150–400)
RBC: 5.04 MIL/uL (ref 4.22–5.81)
RDW: 13.4 % (ref 11.5–15.5)
WBC Count: 5.3 K/uL (ref 4.0–10.5)
nRBC: 0 % (ref 0.0–0.2)

## 2024-10-13 LAB — CMP (CANCER CENTER ONLY)
ALT: 27 U/L (ref 0–44)
AST: 29 U/L (ref 15–41)
Albumin: 4.4 g/dL (ref 3.5–5.0)
Alkaline Phosphatase: 78 U/L (ref 38–126)
Anion gap: 5 (ref 5–15)
BUN: 18 mg/dL (ref 8–23)
CO2: 28 mmol/L (ref 22–32)
Calcium: 9.7 mg/dL (ref 8.9–10.3)
Chloride: 109 mmol/L (ref 98–111)
Creatinine: 1.16 mg/dL (ref 0.61–1.24)
GFR, Estimated: >60 mL/min (ref >60)
Glucose, Bld: 112 mg/dL — ABNORMAL HIGH (ref 70–99)
Potassium: 3.9 mmol/L (ref 3.5–5.1)
Sodium: 142 mmol/L (ref 135–145)
Total Bilirubin: 0.7 mg/dL (ref 0.0–1.2)
Total Protein: 6.4 g/dL — ABNORMAL LOW (ref 6.5–8.1)

## 2024-10-13 LAB — LACTATE DEHYDROGENASE: LDH: 205 U/L — ABNORMAL HIGH (ref 98–192)

## 2024-10-13 NOTE — Progress Notes (Signed)
 HEMATOLOGY ONCOLOGY PROGRESS NOTE  Date of service: 10/13/2024  Patient Care Team: Okey Carlin Redbird, MD as PCP - General (Family Medicine)  CHIEF COMPLAINT/PURPOSE OF CONSULTATION: Continued f/u for evaluation and management of FL and DLBCL  HISTORY OF PRESENTING ILLNESS: (07/03/2015) Ethan Stewart 78 y.o. male of caucasian ancestry is very pleasant and has been in fairly good overall health with minimal chronic comorbidities. Patient presents with 4-6 weeks of worsening neck pain and tingling and numbness of the shoulders and left upper extremity. He first noticed the symptoms about 6 weeks ago at which point he was concerned about whether he had Lyme disease because he had a deer tick bite and will his other friends had take bites and acquired Lyme disease. Patient notes that he went to his primary care physician was tested for Lyme disease and noted negative titers. He had no localized target lesion or rashes at the site of the tick bite. Patient subsequently presented to his primary care physician again on 06/12/2015 with worsening back pain and had an MRI of the C-spine ordered. Which was subsequently done on 06/21/2015 and showed marrow replacement of the C5 vertebral body highly concerning for a neoplasm. Epidural tumor was also noted at this level resulting in moderate spinal stenosis with moderate impression on the spinal cord and mild cord edema. Superimposed pathologic fracture was not excluded.   Patient's primary care physician referred him to the emergency room where he was seen on 06/21/2015. The patient was evaluated by the ED physician consulted Dr. Joshua from neurosurgery and the admitting hospitalist. The patient was started on dexamethasone  4 milligrams 3 times a day and given tramadol when necessary for pain management. It was determined that he did not need an inpatient workup or management. Patient reports that he was not given a neurosurgery followup was recommended to continue  using a hard cervical collar which she continues to do..   Patient notes that his neck pain has improved with using the heart cervical collar and taking his dexamethasone . He notes improvement in his bilateral upper discomfort and tingling. He notes that his left upper extremity only has some tingling over his arm. He notes some proximal muscle weakness in bilateral shoulders noted when he tries to reach for things above his shoulders.   He is appreciative of being seen in clinic today to try to figure out what is happening. He is appropriately anxious that delay in his diagnosis and treatment might need to him getting paralyzed which he describes as being 'worse than death for himself.    Patient denies any fevers/chills/weight loss/night sweats.   no other obvious areas of back pain or bone pain. No overt weight loss. Has been feeling very hungry since being on steroids.   SUMMARY OF ONCOLOGIC HISTORY: Oncology History  DLBCL (diffuse large B cell lymphoma) (HCC)  06/21/2015 Imaging   CT Chest/abd/pelvis: IMPRESSION: 5 mm nodule within the right lower lobe as described.   Pneumobilia consistent with a prior sphincterotomy.   No acute abnormality is identified. No findings to suggest chest etiology for the known lesion at C5 are seen.   06/21/2015 Imaging   MRI c spine1. Diffuse marrow replacement of the C5 vertebral body highly concerning for neoplasm (metastatic disease, myeloma, or lymphoma). Epidural tumor at this level results in moderate spinal stenosis with moderate impression on the spinal cord    07/10/2015 PET scan   1. The previously demonstrated abnormal C5 vertebral body is hypermetabolic. No other osseous lesions  demonstrated. 2. There are small hypermetabolic lymph nodes within the left mesentery and both inguinal regions.    07/26/2015 Initial Biopsy   Had a C5 corpectomy pathology showed diffuse large B-cell lymphoma   08/08/2015 -  Chemotherapy   Started for cycle of R  CHOP.    08/09/2015 -  Chemotherapy   Received first cycle of intrathecal methotrexate  plus hydrocortisone  for CNS prophylaxis.   02/05/2019 - 12/25/2021 Chemotherapy   Patient is on Treatment Plan : NON-HODGKINS LYMPHOMA Rituximab  q month       INTERVAL HISTORY:  Ethan Stewart is a 78 y.o. male who is here today for continued evaluation and management of follicular lymphoma and DLBC. accompanied by his wife.  he was last seen by me on 04/14/2024; at the time he mentioned experiencing improvement in the LN of his inguinal lymph node. Reported a fall from January with a subsequent wrist injury and head trauma that required stitching, but was negative for fracture or brain damage on CT Head & CT Brain.   Today, he says that he and his wife are dealing with some personal issues regarding their son, but otherwise he is doing well without complaints. He states that he has lost about 6 pounds recently, attributed to this stress.  Denies any fevers/chills, drenching night sweats, unexpected weight change, new lumps/bumps.   Reports that he recently saw Cardiology after a CXR noted cardiomegaly. He is scheduled for an ECHO in November.  Endorses having some issues with BPH, which he does take Flomax  for, as well as experiencing ankle/foot issues which he attributes to a hx of torn ligaments and arthritis.  REVIEW OF SYSTEMS:    10 Point review of systems of done and is negative except as noted above.  MEDICAL HISTORY Past Medical History:  Diagnosis Date   Anxiety    Anxiety    ARDS (adult respiratory distress syndrome) (HCC)    2016 August at [facility]   Arm numbness 08/07/2015   Limited movement due to tumor on spine   B-cell lymphoma (HCC)    Bone cancer (HCC)     tumor on C5   BPH (benign prostatic hyperplasia)    Cervical spine tumor 07/03/2015   COVID-19    Diffuse large B cell lymphoma (HCC)    biposy 07/25/2105   GERD (gastroesophageal reflux disease)    H. pylori infection     Hemiplegia (HCC)    History of bleeding ulcers 1990's   History of blood transfusion    Hypercholesterolemia    Hypothyroidism    Hypothyroidism (acquired)    Insomnia    Non Hodgkin's lymphoma (HCC)    Sleep disorder     SURGICAL HISTORY Past Surgical History:  Procedure Laterality Date   ANTERIOR CERVICAL CORPECTOMY N/A 07/26/2015   Procedure: Cervical five Corpectomy/Cervical four-six Fusion/Plate ;  Surgeon: Alm GORMAN Molt, MD;  Location: [facility];  Service: Neurosurgery;  Laterality: N/A;  C5 Corpectomy/C4-6 Fusion/Plate R5-3   COLONOSCOPY     EYE SURGERY Right    cataracts   HERNIA REPAIR Bilateral    with mesh   IR GENERIC HISTORICAL  02/03/2017   IR REMOVAL TUN ACCESS W/ PORT W/O FL MOD SED 02/03/2017 Juliene Balder, MD [facility]INTER[facility]   IR REMOVAL TUN ACCESS W/ PORT W/O FL MOD SED  06/28/2020   LYMPH NODE BIOPSY Right 12/11/2018   Procedure: EXCISIONAL BIOPSY OF DEEP RIGHT INGUINAL LYMPH NODE ERAS PATHWAY;  Surgeon: Gail Favorite, MD;  Location: [facility] OR;  Service: General;  Laterality: Right;    SOCIAL HISTORY Social History   Tobacco Use   Smoking status: Former    Current packs/day: 0.00    Average packs/day: 1 pack/day for 10.0 years (10.0 ttl pk-yrs)    Types: Cigarettes    Start date: 12/24/1971    Quit date: 12/23/1981    Years since quitting: 42.8   Smokeless tobacco: Never  Vaping Use   Vaping status: Never Used  Substance Use Topics   Alcohol use: Yes    Alcohol/week: 5.0 standard drinks of alcohol    Types: 2 Glasses of wine, 3 Cans of beer per week   Drug use: No    Social History   Social History Narrative   Not on file    SOCIAL DRIVERS OF HEALTH SDOH Screenings   Tobacco Use: Medium Risk (09/20/2024)     FAMILY HISTORY Family History  Problem Relation Age of Onset   Hypertension Mother      ALLERGIES: is allergic to pegfilgrastim  and amoxicillin.  MEDICATIONS  Current Outpatient Medications  Medication Sig Dispense Refill    cholecalciferol (VITAMIN D3) 25 MCG (1000 UNIT) tablet 1 tablet     diazepam  (VALIUM ) 2 MG tablet Take 2 mg by mouth daily as needed for anxiety.     levothyroxine (SYNTHROID , LEVOTHROID) 88 MCG tablet Take 88 mcg by mouth daily before breakfast.   2   Multiple Vitamins-Minerals (CENTRUM SILVER PO) Take by mouth daily.     polyethylene glycol powder (GLYCOLAX /MIRALAX ) 17 GM/SCOOP powder TAKE AS DIRECTED BY MOUTH     simvastatin  (ZOCOR ) 20 MG tablet Take 20 mg by mouth daily.     tamsulosin  (FLOMAX ) 0.4 MG CAPS capsule Take 1 capsule (0.4 mg total) by mouth at bedtime. 30 capsule 3   temazepam  (RESTORIL ) 15 MG capsule Take 15 mg by mouth at bedtime as needed for sleep.      vitamin B-12 (CYANOCOBALAMIN) 500 MCG tablet Take 500 mcg by mouth daily.     No current facility-administered medications for this visit.    PHYSICAL EXAMINATION: ECOG PERFORMANCE STATUS: 1 - Symptomatic but completely ambulatory VITALS: Vitals:   10/13/24 0920 10/13/24 0928  BP: (!) 153/90 (!) 149/91  Pulse: 72   Resp: 20   Temp: (!) 97.5 F (36.4 C)   SpO2: 98%    Filed Weights   10/13/24 0920  Weight: 215 lb 14.4 oz (97.9 kg)   Body mass index is 29.28 kg/m.  GENERAL: alert, in no acute distress and comfortable SKIN: no acute rashes, no significant lesions EYES: conjunctiva are pink and non-injected, sclera anicteric OROPHARYNX: MMM, no exudates, no oropharyngeal erythema or ulceration NECK: supple, no JVD LYMPH:  no palpable lymphadenopathy in the cervical, axillary or inguinal regions LUNGS: clear to auscultation b/l with normal respiratory effort HEART: regular rate & rhythm ABDOMEN:  normoactive bowel sounds , non tender, not distended, no hepatosplenomegaly Extremity: no pedal edema PSYCH: alert & oriented x 3 with fluent speech NEURO: no focal motor/sensory deficits  LABORATORY DATA:   I have reviewed the data as listed     Latest Ref Rng & Units 10/13/2024    8:16 AM 04/14/2024    8:27  AM 10/15/2023    8:25 AM  CBC EXTENDED  WBC 4.0 - 10.5 K/uL 5.3  4.0  6.5   RBC 4.22 - 5.81 MIL/uL 5.04  4.98  4.89   Hemoglobin 13.0 - 17.0 g/dL 84.8  85.0  85.1   HCT 39.0 - 52.0 %  42.8  42.8  43.3   Platelets 150 - 400 K/uL 144  144  142   NEUT# 1.7 - 7.7 K/uL 3.5  2.3  4.5   Lymph# 0.7 - 4.0 K/uL 1.2  1.2  1.2    LDH:  Lab Results  Component Value Date   LDH 205 (H) 10/13/2024        Latest Ref Rng & Units 10/13/2024    8:16 AM 04/14/2024    8:27 AM 10/15/2023    8:25 AM  CMP  Glucose 70 - 99 mg/dL 887  892  91   BUN 8 - 23 mg/dL 18  16  21    Creatinine 0.61 - 1.24 mg/dL 8.83  9.00  8.86   Sodium 135 - 145 mmol/L 142  142  141   Potassium 3.5 - 5.1 mmol/L 3.9  4.0  4.3   Chloride 98 - 111 mmol/L 109  109  108   CO2 22 - 32 mmol/L 28  28  28    Calcium 8.9 - 10.3 mg/dL 9.7  9.2  9.3   Total Protein 6.5 - 8.1 g/dL 6.4  6.2  6.2   Total Bilirubin 0.0 - 1.2 mg/dL 0.7  0.6  0.7   Alkaline Phos 38 - 126 U/L 78  77  74   AST 15 - 41 U/L 29  27  24    ALT 0 - 44 U/L 27  24  13       RADIOGRAPHIC STUDIES: I have personally reviewed the radiological images as listed and agreed with the findings in the report. CT CHEST ABDOMEN PELVIS W CONTRAST Result Date: 09/10/2024 CLINICAL DATA:  Hematologic malignancy, monitor. * Tracking Code: BO * EXAM: CT CHEST, ABDOMEN, AND PELVIS WITH CONTRAST TECHNIQUE: Multidetector CT imaging of the chest, abdomen and pelvis was performed following the standard protocol during bolus administration of intravenous contrast. RADIATION DOSE REDUCTION: This exam was performed according to the departmental dose-optimization program which includes automated exposure control, adjustment of the mA and/or kV according to patient size and/or use of iterative reconstruction technique. CONTRAST:  OMNIPAQUE  IOHEXOL  300 MG/ML  SOLN COMPARISON:  Priors including CT April 04, 2023 FINDINGS: CT CHEST FINDINGS Cardiovascular: Aortic atherosclerosis. Four-chamber  cardiac enlargement. No significant pericardial effusion/thickening. Mediastinum/Nodes: No suspicious thyroid  nodule. No pathologically enlarged mediastinal, hilar or axillary lymph nodes. Lungs/Pleura: Similar to slightly decreased size of the scattered pulmonary nodules. No new suspicious pulmonary nodules or masses. For reference: -right lower lobe pulmonary nodule measures 9 mm on image 106/4 previously 11 mm. -superior segment of the right lower lobe pulmonary nodule measures 4 mm on image 58/4 previously 4 mm -medial right upper lobe pulmonary nodule previously measuring 3 mm is not seen on today's examination. -lateral right lower lobe pulmonary nodule measures 5 mm on image 92/4, unchanged. Bibasilar scarring/atelectasis. Musculoskeletal: Anterior cervical fusion hardware. No aggressive lytic or blastic lesion of bone. Thoracic spondylosis. Bridging anterior vertebral osteophytes. CT ABDOMEN PELVIS FINDINGS Hepatobiliary: No suspicious hepatic lesion. Pneumobilia likely reflecting sequela of prior sphincterotomy. Gallbladder is unremarkable. No biliary ductal dilation. Pancreas: No pancreatic ductal dilation or evidence of acute inflammation. Spleen: No splenomegaly. Adrenals/Urinary Tract: No suspicious adrenal nodule/mass. No hydronephrosis. Kidneys demonstrate symmetric enhancement. Urinary bladder is unremarkable for degree of distension. Stomach/Bowel: Stomach is nondistended limiting evaluation. No pathologic dilation of small or large bowel. Noninflamed appendix. Vascular/Lymphatic: Aortic atherosclerosis. Retroaortic left renal vein. Smooth IVC contours. The portal, splenic and superior mesenteric veins are patent. No pathologically enlarged abdominal or  pelvic lymph nodes. Reproductive: Enlarged prostate gland. Other: Surgical clips in the right inguinal canal. Musculoskeletal: No aggressive lytic or blastic lesion of bone. Lumbar spondylosis. IMPRESSION: 1. No pathologically enlarged lymph nodes in  the chest, abdomen or pelvis. No splenomegaly. 2. Similar to slightly decreased size of the scattered pulmonary nodules. No new suspicious pulmonary nodules or masses. 3. Enlarged prostate gland. 4.  Aortic Atherosclerosis (ICD10-I70.0). Electronically Signed   By: Reyes Holder M.D.   On: 09/10/2024 09:11     ASSESSMENT & PLAN:  78 y.o. male with  1 Follicular lymphoma -Previously treated with Revlimid  and Rituxan , currently on Rituxan  maintenance q2 months . CT chest abdomen pelvis on 06/27/2021 which showed no evidence of lymphoma recurrence on his CT chest abdomen pelvis.   2.   history of diffuse large B-cell lymphoma currently in remission Status post treatment with modified dose reduced R-CHOP   3.  History of Neulasta  related ARDS  PLAN: - Discussed lab results on 10/13/2024 in detail with patient: CBC stable with WBC of 5.3K, Hemoglobin of 15.1, and PLTs of 144K has not changed from 03/2024. CMP stable.  LDH slightly elevated at 205.  - Reviewed 09/08/2024 CT CAP with patient:  1. No pathologically enlarged lymph nodes in the chest, abdomen or pelvis. No splenomegaly.  2. Similar to slightly decreased size of the scattered pulmonary nodules. No new suspicious pulmonary nodules or masses.  3. Enlarged prostate gland.  4.  Aortic Atherosclerosis   - Vaccine counseling provided: update age-recommended vaccinations. He is UTD with RSV and Influenza.   - Continue to monitor for changes in the knot in his groin. He shall inform us  if the knot is persistent or increases in size  Did not feel any other enlarged lymph nodes during physical examination  - No lab or clinical findings suggestive of recurrent/progressive lymphoma at this time. Will continue to monitor.   FOLLOW-UP in 6 months for labs and follow-up with Dr. Onesimo.  The total time spent in the appointment was 30 minutes* .  All of the patient's questions were answered and the patient knows to call the clinic with any  problems, questions, or concerns.  Emaline Onesimo MD MS AAHIVMS [facility] Hematology/Oncology Physician [facility]  *Total Encounter Time as defined by the Centers for Medicare and Medicaid Services includes, in addition to the face-to-face time of a patient visit (documented in the note above) non-face-to-face time: obtaining and reviewing outside history, ordering and reviewing medications, tests or procedures, care coordination (communications with other health care professionals or caregivers) and documentation in the medical record.  I,Emily Lagle,acting as a neurosurgeon for Emaline Onesimo, MD.,have documented all relevant documentation on the behalf of Emaline Onesimo, MD,as directed by  Emaline Onesimo, MD while in the presence of Emaline Onesimo, MD.  I have reviewed the above documentation for accuracy and completeness, and I agree with the above.  Clariza Sickman, MD

## 2024-10-19 ENCOUNTER — Encounter: Payer: Self-pay | Admitting: Hematology

## 2024-10-29 ENCOUNTER — Ambulatory Visit (HOSPITAL_COMMUNITY)
Admission: RE | Admit: 2024-10-29 | Discharge: 2024-10-29 | Disposition: A | Discharge status: Home / Self Care | Payer: Self-pay | Source: Ambulatory Visit | Attending: Internal Medicine | Admitting: Internal Medicine

## 2024-10-29 ENCOUNTER — Ambulatory Visit: Payer: Self-pay | Admitting: Cardiovascular Disease

## 2024-10-29 ENCOUNTER — Ambulatory Visit (HOSPITAL_COMMUNITY)
Admission: RE | Admit: 2024-10-29 | Discharge: 2024-10-29 | Disposition: A | Discharge status: Home / Self Care | Payer: Self-pay | Source: Ambulatory Visit | Attending: Cardiovascular Disease | Admitting: Cardiovascular Disease

## 2024-10-29 DIAGNOSIS — I359 Nonrheumatic aortic valve disorder, unspecified: Secondary | ICD-10-CM | POA: Diagnosis not present

## 2024-10-29 DIAGNOSIS — Z8249 Family history of ischemic heart disease and other diseases of the circulatory system: Secondary | ICD-10-CM | POA: Diagnosis not present

## 2024-10-29 DIAGNOSIS — I471 Supraventricular tachycardia, unspecified: Secondary | ICD-10-CM | POA: Diagnosis present

## 2024-10-29 DIAGNOSIS — I499 Cardiac arrhythmia, unspecified: Secondary | ICD-10-CM | POA: Diagnosis not present

## 2024-10-29 DIAGNOSIS — Z87891 Personal history of nicotine dependence: Secondary | ICD-10-CM | POA: Insufficient documentation

## 2024-10-29 DIAGNOSIS — C8590 Non-Hodgkin lymphoma, unspecified, unspecified site: Secondary | ICD-10-CM | POA: Insufficient documentation

## 2024-10-29 DIAGNOSIS — E782 Mixed hyperlipidemia: Secondary | ICD-10-CM | POA: Diagnosis present

## 2024-10-29 LAB — ECHOCARDIOGRAM COMPLETE
Area-P 1/2: 2.05 cm2
S' Lateral: 3.4 cm

## 2024-11-10 ENCOUNTER — Telehealth: Payer: Self-pay | Admitting: Cardiovascular Disease

## 2024-11-10 DIAGNOSIS — C8590 Non-Hodgkin lymphoma, unspecified, unspecified site: Secondary | ICD-10-CM | POA: Diagnosis not present

## 2024-11-10 DIAGNOSIS — E782 Mixed hyperlipidemia: Secondary | ICD-10-CM

## 2024-11-10 DIAGNOSIS — Z8249 Family history of ischemic heart disease and other diseases of the circulatory system: Secondary | ICD-10-CM

## 2024-11-10 DIAGNOSIS — B029 Zoster without complications: Secondary | ICD-10-CM | POA: Diagnosis not present

## 2024-11-10 DIAGNOSIS — G479 Sleep disorder, unspecified: Secondary | ICD-10-CM | POA: Diagnosis not present

## 2024-11-10 MED ORDER — ATORVASTATIN CALCIUM 40 MG PO TABS: 40.0000 mg | ORAL_TABLET | Freq: Every day | ORAL | 3 refills | Status: AC

## 2024-11-10 NOTE — Telephone Encounter (Signed)
 Court Dorn PARAS, MD to [facility] Triage     11/10/24  4:22 PM I changed his simvastatin  20 mg to atorvastatin 40 mg.  Prescription sent to pt preferred pharmacy. Detailed message (ok per DPR) left on voicemail regarding this. Pt advised to get fasting labs in 3 months. Call back number left for pt.

## 2024-11-10 NOTE — Telephone Encounter (Signed)
 Pt calling to f/u on atorvostatin Rx. Pt states it is not at his pharmacy yet. Please advise.

## 2024-11-11 DIAGNOSIS — Z79899 Other long term (current) drug therapy: Secondary | ICD-10-CM | POA: Diagnosis not present

## 2024-11-11 DIAGNOSIS — Z125 Encounter for screening for malignant neoplasm of prostate: Secondary | ICD-10-CM | POA: Diagnosis not present

## 2024-11-11 DIAGNOSIS — E7800 Pure hypercholesterolemia, unspecified: Secondary | ICD-10-CM | POA: Diagnosis not present

## 2024-11-11 DIAGNOSIS — E038 Other specified hypothyroidism: Secondary | ICD-10-CM | POA: Diagnosis not present

## 2024-11-11 DIAGNOSIS — C8590 Non-Hodgkin lymphoma, unspecified, unspecified site: Secondary | ICD-10-CM | POA: Diagnosis not present

## 2024-11-17 DIAGNOSIS — N400 Enlarged prostate without lower urinary tract symptoms: Secondary | ICD-10-CM | POA: Diagnosis not present

## 2024-11-17 DIAGNOSIS — E038 Other specified hypothyroidism: Secondary | ICD-10-CM | POA: Diagnosis not present

## 2024-11-17 DIAGNOSIS — Z0000 Encounter for general adult medical examination without abnormal findings: Secondary | ICD-10-CM | POA: Diagnosis not present

## 2024-11-17 DIAGNOSIS — F411 Generalized anxiety disorder: Secondary | ICD-10-CM | POA: Diagnosis not present

## 2024-11-17 DIAGNOSIS — G479 Sleep disorder, unspecified: Secondary | ICD-10-CM | POA: Diagnosis not present

## 2024-11-17 DIAGNOSIS — E7800 Pure hypercholesterolemia, unspecified: Secondary | ICD-10-CM | POA: Diagnosis not present

## 2024-12-29 ENCOUNTER — Ambulatory Visit: Admitting: Cardiovascular Disease

## 2024-12-29 ENCOUNTER — Encounter: Payer: Self-pay | Admitting: Cardiovascular Disease

## 2024-12-29 VITALS — BP 130/76 | HR 74 | Ht 72.0 in | Wt 220.0 lb

## 2024-12-29 DIAGNOSIS — E782 Mixed hyperlipidemia: Secondary | ICD-10-CM

## 2024-12-29 DIAGNOSIS — I471 Supraventricular tachycardia, unspecified: Secondary | ICD-10-CM

## 2024-12-29 DIAGNOSIS — I519 Heart disease, unspecified: Secondary | ICD-10-CM | POA: Diagnosis not present

## 2024-12-29 NOTE — Assessment & Plan Note (Signed)
 Patient was initially referred to me because of arrhythmias found on Zio patch performed 02/02/2024.  He did have short episodes of PSVT, NSVT and PVCs although he is completely unaware of these.  At this point, I do not feel compelled to begin him on any pharmacologic therapy given his lack of symptoms.

## 2024-12-29 NOTE — Patient Instructions (Signed)
 Medication Instructions:  Your physician recommends that you continue on your current medications as directed. Please refer to the Current Medication list given to you today.  *If you need a refill on your cardiac medications before your next appointment, please call your pharmacy*  Lab Work: Your physician recommends that you return for lab work in: the next week or 2 for FASTING lipid/liver panel  If you have labs (blood work) drawn today and your tests are completely normal, you will receive your results only by: MyChart Message (if you have MyChart) OR A paper copy in the mail If you have any lab test that is abnormal or we need to change your treatment, we will call you to review the results.  Testing/Procedures: Your physician has requested that you have an echocardiogram. Echocardiography is a painless test that uses sound waves to create images of your heart. It provides your doctor with information about the size and shape of your heart and how well your hearts chambers and valves are working. This procedure takes approximately one hour. There are no restrictions for this procedure. Please do NOT wear cologne, perfume, aftershave, or lotions (deodorant is allowed). Please arrive 15 minutes prior to your appointment time.  Please note: We ask at that you not bring children with you during ultrasound (echo/ vascular) testing. Due to room size and safety concerns, children are not allowed in the ultrasound rooms during exams. Our front office staff cannot provide observation of children in our lobby area while testing is being conducted. An adult accompanying a patient to their appointment will only be allowed in the ultrasound room at the discretion of the ultrasound technician under special circumstances. We apologize for any inconvenience. **To do in November**   Follow-Up: At [facility], you and your health needs are our priority.  As part of our continuing mission to  provide you with exceptional heart care, our providers are all part of one team.  This team includes your primary Cardiologist (physician) and Advanced Practice Providers or APPs (Physician Assistants and Nurse Practitioners) who all work together to provide you with the care you need, when you need it.  Your next appointment:   6 month(s)  Provider:   Callie Goodrich, PA-C, Kathleen Johnson, PA-C, Hao Meng, PA-C, Damien Braver, NP, or Katlyn West, NP         Then, Dorn Lesches, MD will plan to see you again in 12 month(s).

## 2024-12-29 NOTE — Assessment & Plan Note (Signed)
 Patient has mild LV dysfunction with 2D echo performed 10/29/2024 revealing EF of 45 to 50% with grade 1 diastolic dysfunction and no valvular abnormalities.  Interestingly, his 2D echo performed in 2016 prior to getting chemotherapy for his Hodgkin's disease revealed an EF of 55 to 60% with mild focal basal septal hypertrophy.  It is possible that his mild decrease in LV function is related to his chemotherapy although given his lack of symptoms and his only mild decrease in LV function I do not think he needs further pharmacologic treatment.

## 2024-12-29 NOTE — Progress Notes (Signed)
 "     12/29/2024 Ethan Stewart   1946-11-09  [id number]  Primary Physician Dayna Motto, DO Primary Cardiologist: Dorn JINNY Lesches MD GENI CODY MADEIRA, FSCAI  HPI:  Ethan Stewart is a 79 y.o.  mildly overweight married Caucasian male father of 2, grandfather of 1 grandchild who was referred by his PCP, Dr. Okey, because of an abnormal Zio patch/arrhythmia. 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  I last saw him in the office 09/20/2024.  His cardiac risk factors notable for treated hyperlipidemia. His father did die of a myocardial infarction at age 16. Never had a heart attack or stroke. Denies chest pain or shortness of breath. He walks 3 miles a day without symptoms. He has had some oncology issues in the past beginning in 2016 when he had non-Hodgkin's lymphoma of C5 vertebrae requiring surgery and chemotherapy. He had recurrent follicular non-Hodgkin's lymphoma in 2019 requiring oral chemotherapy. He wore a Zio patch 02/02/2024 for unclear reasons over 2 weeks and was noted to have short episodes of PSVT, NSVT and occasional PVCs. He is completely unaware of these. He did have a chest CT performed 09/08/2024 that showed aortic atherosclerosis with four-chamber cardiac enlargement.  I did obtain a coronary calcium  score on him 10/29/2024 which was 342 distributed in the LAD and RCA.  A 2D echo performed 10/29/2024 revealed mild decrease in LV function with an EF of 45 to 50% and no valvular abnormalities.  This is in contrast to an echo performed 08/08/2015 which revealed normal LV function.  This that was performed prior to receiving chemotherapy for his Hodgkin's disease.  He remains very active and is completely asymptomatic.  I did change his simvastatin  to atorvastatin  in order to achieve an LDL goal of less than 70 for secondary prevention.   Active Medications[1]   Allergies[2]  Social History   Socioeconomic History   Marital  status: Married    Spouse name: Not on file   Number of children: Not on file   Years of education: Not on file   Highest education level: Not on file  Occupational History   Not on file  Tobacco Use   Smoking status: Former    Current packs/day: 0.00    Average packs/day: 1 pack/day for 10.0 years (10.0 ttl pk-yrs)    Types: Cigarettes    Start date: 12/24/1971    Quit date: 12/23/1981    Years since quitting: 43.0   Smokeless tobacco: Never  Vaping Use   Vaping status: Never Used  Substance and Sexual Activity   Alcohol use: Yes    Alcohol/week: 5.0 standard drinks of alcohol    Types: 2 Glasses of wine, 3 Cans of beer per week   Drug use: No   Sexual activity: Yes  Other Topics Concern   Not on file  Social History Narrative   Not on file   Social Drivers of Health   Tobacco Use: Medium Risk (12/29/2024)   Patient History    Smoking Tobacco Use: Former    Smokeless Tobacco Use: Never    Passive Exposure: Not on Actuary Strain: Not on file  Food Insecurity: Not on file  Transportation Needs: Not on file  Physical Activity: Not on file  Stress: Not on file  Social Connections: Not on file  Intimate Partner Violence: Not on file  Depression (EYV7-0): Not on file  Alcohol Screen: Not on file  Housing: Not on file  Utilities: Not on file  Health Literacy: Not on file     Review of Systems: General: negative for chills, fever, night sweats or weight changes.  Cardiovascular: negative for chest pain, dyspnea on exertion, edema, orthopnea, palpitations, paroxysmal nocturnal dyspnea or shortness of breath Dermatological: negative for rash Respiratory: negative for cough or wheezing Urologic: negative for hematuria Abdominal: negative for nausea, vomiting, diarrhea, bright red blood per rectum, melena, or hematemesis Neurologic: negative for visual changes, syncope, or dizziness All other systems reviewed and are otherwise negative except as noted  above.    Blood pressure 130/76, pulse 74, height 6' (1.829 m), weight 220 lb (99.8 kg), SpO2 95%.  General appearance: alert and no distress Neck: no adenopathy, no carotid bruit, no JVD, supple, symmetrical, trachea midline, and thyroid  not enlarged, symmetric, no tenderness/mass/nodules Lungs: clear to auscultation bilaterally Heart: regular rate and rhythm, S1, S2 normal, no murmur, click, rub or gallop Extremities: extremities normal, atraumatic, no cyanosis or edema Pulses: 2+ and symmetric Skin: Skin color, texture, turgor normal. No rashes or lesions Neurologic: Grossly normal  EKG not performed today      ASSESSMENT AND PLAN:   Hyperlipidemia History of hyperlipidemia on simvastatin  initially changed to atorvastatin  by myself with lipid profile performed 11/11/2024 revealing total cholesterol 178, LDL 105 and HDL 58.  In light of his elevated coronary calcium  score his LDL goal should be less than 70.  We will recheck a lipid liver profile since being on atorvastatin .  Cardiac arrhythmia Patient was initially referred to me because of arrhythmias found on Zio patch performed 02/02/2024.  He did have short episodes of PSVT, NSVT and PVCs although he is completely unaware of these.  At this point, I do not feel compelled to begin him on any pharmacologic therapy given his lack of symptoms.  LV dysfunction Patient has mild LV dysfunction with 2D echo performed 10/29/2024 revealing EF of 45 to 50% with grade 1 diastolic dysfunction and no valvular abnormalities.  Interestingly, his 2D echo performed in 2016 prior to getting chemotherapy for his Hodgkin's disease revealed an EF of 55 to 60% with mild focal basal septal hypertrophy.  It is possible that his mild decrease in LV function is related to his chemotherapy although given his lack of symptoms and his only mild decrease in LV function I do not think he needs further pharmacologic treatment.     Dorn DOROTHA Lesches MD  FACP,FACC,FAHA, FSCAI 12/29/2024 10:01 AM    [1]  Current Meds  Medication Sig   atorvastatin  (LIPITOR) 40 MG tablet Take 1 tablet (40 mg total) by mouth daily.   cholecalciferol (VITAMIN D3) 25 MCG (1000 UNIT) tablet 1 tablet   diazepam  (VALIUM ) 2 MG tablet Take 2 mg by mouth daily as needed for anxiety.   levothyroxine (SYNTHROID, LEVOTHROID) 88 MCG tablet Take 88 mcg by mouth daily before breakfast.    Multiple Vitamins-Minerals (CENTRUM SILVER PO) Take by mouth daily.   tamsulosin  (FLOMAX ) 0.4 MG CAPS capsule Take 1 capsule (0.4 mg total) by mouth at bedtime.   temazepam  (RESTORIL ) 15 MG capsule Take 15 mg by mouth at bedtime as needed for sleep.    vitamin B-12 (CYANOCOBALAMIN) 500 MCG tablet Take 500 mcg by mouth daily.  [2]  Allergies Allergen Reactions   Pegfilgrastim  Other (See Comments)    ARDS likely related to Neulasta  Other reaction(s): ARDS   Amoxicillin Other (See Comments)   "

## 2024-12-29 NOTE — Assessment & Plan Note (Signed)
 History of hyperlipidemia on simvastatin  initially changed to atorvastatin  by myself with lipid profile performed 11/11/2024 revealing total cholesterol 178, LDL 105 and HDL 58.  In light of his elevated coronary calcium  score his LDL goal should be less than 70.  We will recheck a lipid liver profile since being on atorvastatin .

## 2025-01-05 LAB — HEPATIC FUNCTION PANEL
ALT: 35 IU/L (ref 0–44)
AST: 34 IU/L (ref 0–40)
Albumin: 4.5 g/dL (ref 3.8–4.8)
Alkaline Phosphatase: 99 IU/L (ref 47–123)
Bilirubin Total: 0.9 mg/dL (ref 0.0–1.2)
Bilirubin, Direct: 0.25 mg/dL (ref 0.00–0.40)
Total Protein: 6 g/dL (ref 6.0–8.5)

## 2025-01-05 LAB — LIPID PANEL
Chol/HDL Ratio: 2.8 ratio (ref 0.0–5.0)
Cholesterol, Total: 168 mg/dL (ref 100–199)
HDL: 61 mg/dL
LDL Chol Calc (NIH): 92 mg/dL (ref 0–99)
Triglycerides: 77 mg/dL (ref 0–149)
VLDL Cholesterol Cal: 15 mg/dL (ref 5–40)

## 2025-01-06 ENCOUNTER — Ambulatory Visit: Payer: Self-pay | Admitting: Cardiovascular Disease

## 2025-01-06 DIAGNOSIS — E782 Mixed hyperlipidemia: Secondary | ICD-10-CM

## 2025-01-06 DIAGNOSIS — E7800 Pure hypercholesterolemia, unspecified: Secondary | ICD-10-CM

## 2025-03-07 ENCOUNTER — Ambulatory Visit: Admitting: Pharmacist

## 2025-04-13 ENCOUNTER — Inpatient Hospital Stay: Admitting: Hematology

## 2025-04-13 ENCOUNTER — Inpatient Hospital Stay

## 2025-05-18 ENCOUNTER — Ambulatory Visit: Admitting: Pulmonary Disease

## 2025-05-30 ENCOUNTER — Ambulatory Visit: Admitting: Pulmonary Disease
